# Patient Record
Sex: Male | Born: 1975 | Race: White | Hispanic: No | Marital: Married | Smoking: Current some day smoker
Health system: Southern US, Community
[De-identification: ages and names within clinical notes are randomized; demographics above are authoritative.]

## PROBLEM LIST (undated history)

## (undated) DIAGNOSIS — F1910 Other psychoactive substance abuse, uncomplicated: Secondary | ICD-10-CM

## (undated) DIAGNOSIS — R569 Unspecified convulsions: Secondary | ICD-10-CM

## (undated) DIAGNOSIS — K219 Gastro-esophageal reflux disease without esophagitis: Secondary | ICD-10-CM

## (undated) DIAGNOSIS — I219 Acute myocardial infarction, unspecified: Secondary | ICD-10-CM

## (undated) DIAGNOSIS — R011 Cardiac murmur, unspecified: Secondary | ICD-10-CM

## (undated) DIAGNOSIS — G709 Myoneural disorder, unspecified: Secondary | ICD-10-CM

## (undated) DIAGNOSIS — I639 Cerebral infarction, unspecified: Secondary | ICD-10-CM

## (undated) DIAGNOSIS — I509 Heart failure, unspecified: Secondary | ICD-10-CM

## (undated) DIAGNOSIS — D649 Anemia, unspecified: Secondary | ICD-10-CM

## (undated) DIAGNOSIS — F32A Depression, unspecified: Secondary | ICD-10-CM

## (undated) DIAGNOSIS — Z5189 Encounter for other specified aftercare: Secondary | ICD-10-CM

## (undated) DIAGNOSIS — IMO0002 Reserved for concepts with insufficient information to code with codable children: Secondary | ICD-10-CM

## (undated) DIAGNOSIS — F419 Anxiety disorder, unspecified: Secondary | ICD-10-CM

## (undated) DIAGNOSIS — I10 Essential (primary) hypertension: Secondary | ICD-10-CM

## (undated) DIAGNOSIS — M1990 Unspecified osteoarthritis, unspecified site: Secondary | ICD-10-CM

## (undated) HISTORY — DX: Acute myocardial infarction, unspecified: I21.9

## (undated) HISTORY — DX: Heart failure, unspecified: I50.9

## (undated) HISTORY — DX: Reserved for concepts with insufficient information to code with codable children: IMO0002

## (undated) HISTORY — DX: Unspecified osteoarthritis, unspecified site: M19.90

## (undated) HISTORY — DX: Other psychoactive substance abuse, uncomplicated: F19.10

## (undated) HISTORY — DX: Cardiac murmur, unspecified: R01.1

## (undated) HISTORY — DX: Unspecified convulsions: R56.9

## (undated) HISTORY — DX: Gastro-esophageal reflux disease without esophagitis: K21.9

## (undated) HISTORY — DX: Myoneural disorder, unspecified: G70.9

## (undated) HISTORY — DX: Encounter for other specified aftercare: Z51.89

## (undated) HISTORY — DX: Cerebral infarction, unspecified: I63.9

## (undated) HISTORY — PX: CARDIAC VALVE REPLACEMENT: SHX585

## (undated) HISTORY — DX: Anemia, unspecified: D64.9

---

## 2001-10-05 ENCOUNTER — Emergency Department (HOSPITAL_COMMUNITY): Admission: EM | Admit: 2001-10-05 | Discharge: 2001-10-05 | Payer: Self-pay

## 2020-11-04 ENCOUNTER — Emergency Department (HOSPITAL_COMMUNITY): Payer: Medicaid Other

## 2020-11-04 ENCOUNTER — Inpatient Hospital Stay (HOSPITAL_COMMUNITY)
Admission: EM | Disposition: A | Discharge status: Rehabilitation Facility | Payer: Self-pay | Source: Home / Self Care | Attending: Critical Care Medicine

## 2020-11-04 ENCOUNTER — Other Ambulatory Visit: Payer: Self-pay

## 2020-11-04 ENCOUNTER — Encounter (HOSPITAL_COMMUNITY): Payer: Self-pay

## 2020-11-04 ENCOUNTER — Inpatient Hospital Stay (HOSPITAL_COMMUNITY)
Admission: EM | Admit: 2020-11-04 | Discharge: 2020-11-13 | DRG: 219 | Disposition: A | Discharge status: Rehabilitation Facility | Payer: Medicaid Other | Attending: Critical Care Medicine | Admitting: Critical Care Medicine

## 2020-11-04 ENCOUNTER — Emergency Department (HOSPITAL_COMMUNITY): Payer: Medicaid Other | Admitting: Certified Registered Nurse Anesthetist

## 2020-11-04 DIAGNOSIS — D6959 Other secondary thrombocytopenia: Secondary | ICD-10-CM | POA: Diagnosis present

## 2020-11-04 DIAGNOSIS — I6521 Occlusion and stenosis of right carotid artery: Secondary | ICD-10-CM | POA: Diagnosis not present

## 2020-11-04 DIAGNOSIS — G8194 Hemiplegia, unspecified affecting left nondominant side: Secondary | ICD-10-CM | POA: Diagnosis present

## 2020-11-04 DIAGNOSIS — F1910 Other psychoactive substance abuse, uncomplicated: Secondary | ICD-10-CM

## 2020-11-04 DIAGNOSIS — R4701 Aphasia: Secondary | ICD-10-CM | POA: Diagnosis present

## 2020-11-04 DIAGNOSIS — Z9289 Personal history of other medical treatment: Secondary | ICD-10-CM

## 2020-11-04 DIAGNOSIS — F17210 Nicotine dependence, cigarettes, uncomplicated: Secondary | ICD-10-CM | POA: Diagnosis present

## 2020-11-04 DIAGNOSIS — D62 Acute posthemorrhagic anemia: Secondary | ICD-10-CM | POA: Diagnosis not present

## 2020-11-04 DIAGNOSIS — I119 Hypertensive heart disease without heart failure: Secondary | ICD-10-CM | POA: Diagnosis present

## 2020-11-04 DIAGNOSIS — I71019 Dissection of thoracic aorta, unspecified: Secondary | ICD-10-CM

## 2020-11-04 DIAGNOSIS — I7101 Dissection of thoracic aorta: Principal | ICD-10-CM | POA: Diagnosis present

## 2020-11-04 DIAGNOSIS — I959 Hypotension, unspecified: Secondary | ICD-10-CM | POA: Diagnosis not present

## 2020-11-04 DIAGNOSIS — R778 Other specified abnormalities of plasma proteins: Secondary | ICD-10-CM | POA: Diagnosis not present

## 2020-11-04 DIAGNOSIS — J9601 Acute respiratory failure with hypoxia: Secondary | ICD-10-CM | POA: Diagnosis not present

## 2020-11-04 DIAGNOSIS — I6330 Cerebral infarction due to thrombosis of unspecified cerebral artery: Secondary | ICD-10-CM

## 2020-11-04 DIAGNOSIS — Z20822 Contact with and (suspected) exposure to covid-19: Secondary | ICD-10-CM | POA: Diagnosis present

## 2020-11-04 DIAGNOSIS — F418 Other specified anxiety disorders: Secondary | ICD-10-CM | POA: Diagnosis not present

## 2020-11-04 DIAGNOSIS — H5461 Unqualified visual loss, right eye, normal vision left eye: Secondary | ICD-10-CM | POA: Diagnosis present

## 2020-11-04 DIAGNOSIS — I6340 Cerebral infarction due to embolism of unspecified cerebral artery: Secondary | ICD-10-CM | POA: Insufficient documentation

## 2020-11-04 DIAGNOSIS — F05 Delirium due to known physiological condition: Secondary | ICD-10-CM | POA: Diagnosis not present

## 2020-11-04 DIAGNOSIS — I639 Cerebral infarction, unspecified: Secondary | ICD-10-CM | POA: Diagnosis not present

## 2020-11-04 DIAGNOSIS — I10 Essential (primary) hypertension: Secondary | ICD-10-CM | POA: Diagnosis present

## 2020-11-04 DIAGNOSIS — R471 Dysarthria and anarthria: Secondary | ICD-10-CM | POA: Diagnosis not present

## 2020-11-04 DIAGNOSIS — Z91199 Patient's noncompliance with other medical treatment and regimen due to unspecified reason: Secondary | ICD-10-CM

## 2020-11-04 DIAGNOSIS — R1310 Dysphagia, unspecified: Secondary | ICD-10-CM | POA: Diagnosis not present

## 2020-11-04 DIAGNOSIS — E876 Hypokalemia: Secondary | ICD-10-CM | POA: Diagnosis not present

## 2020-11-04 DIAGNOSIS — Z978 Presence of other specified devices: Secondary | ICD-10-CM

## 2020-11-04 DIAGNOSIS — G9341 Metabolic encephalopathy: Secondary | ICD-10-CM | POA: Diagnosis not present

## 2020-11-04 DIAGNOSIS — Z681 Body mass index (BMI) 19 or less, adult: Secondary | ICD-10-CM

## 2020-11-04 DIAGNOSIS — I63411 Cerebral infarction due to embolism of right middle cerebral artery: Secondary | ICD-10-CM | POA: Diagnosis not present

## 2020-11-04 DIAGNOSIS — J9811 Atelectasis: Secondary | ICD-10-CM

## 2020-11-04 DIAGNOSIS — Z9889 Other specified postprocedural states: Secondary | ICD-10-CM

## 2020-11-04 DIAGNOSIS — G936 Cerebral edema: Secondary | ICD-10-CM | POA: Diagnosis not present

## 2020-11-04 DIAGNOSIS — R7989 Other specified abnormal findings of blood chemistry: Secondary | ICD-10-CM | POA: Diagnosis not present

## 2020-11-04 DIAGNOSIS — R9431 Abnormal electrocardiogram [ECG] [EKG]: Secondary | ICD-10-CM | POA: Diagnosis not present

## 2020-11-04 DIAGNOSIS — E43 Unspecified severe protein-calorie malnutrition: Secondary | ICD-10-CM | POA: Diagnosis present

## 2020-11-04 DIAGNOSIS — I7100 Dissection of unspecified site of aorta: Secondary | ICD-10-CM

## 2020-11-04 DIAGNOSIS — I808 Phlebitis and thrombophlebitis of other sites: Secondary | ICD-10-CM | POA: Diagnosis not present

## 2020-11-04 DIAGNOSIS — I7771 Dissection of carotid artery: Secondary | ICD-10-CM | POA: Diagnosis present

## 2020-11-04 DIAGNOSIS — L0390 Cellulitis, unspecified: Secondary | ICD-10-CM

## 2020-11-04 DIAGNOSIS — Z560 Unemployment, unspecified: Secondary | ICD-10-CM

## 2020-11-04 DIAGNOSIS — E871 Hypo-osmolality and hyponatremia: Secondary | ICD-10-CM | POA: Diagnosis not present

## 2020-11-04 DIAGNOSIS — E162 Hypoglycemia, unspecified: Secondary | ICD-10-CM | POA: Diagnosis not present

## 2020-11-04 DIAGNOSIS — Z01818 Encounter for other preprocedural examination: Secondary | ICD-10-CM

## 2020-11-04 DIAGNOSIS — Z781 Physical restraint status: Secondary | ICD-10-CM

## 2020-11-04 DIAGNOSIS — I083 Combined rheumatic disorders of mitral, aortic and tricuspid valves: Secondary | ICD-10-CM | POA: Diagnosis not present

## 2020-11-04 DIAGNOSIS — I351 Nonrheumatic aortic (valve) insufficiency: Secondary | ICD-10-CM | POA: Diagnosis present

## 2020-11-04 DIAGNOSIS — I809 Phlebitis and thrombophlebitis of unspecified site: Secondary | ICD-10-CM

## 2020-11-04 DIAGNOSIS — Z7289 Other problems related to lifestyle: Secondary | ICD-10-CM

## 2020-11-04 DIAGNOSIS — Z4659 Encounter for fitting and adjustment of other gastrointestinal appliance and device: Secondary | ICD-10-CM

## 2020-11-04 DIAGNOSIS — R29718 NIHSS score 18: Secondary | ICD-10-CM | POA: Diagnosis not present

## 2020-11-04 DIAGNOSIS — Z9119 Patient's noncompliance with other medical treatment and regimen: Secondary | ICD-10-CM

## 2020-11-04 DIAGNOSIS — R4587 Impulsiveness: Secondary | ICD-10-CM | POA: Diagnosis not present

## 2020-11-04 DIAGNOSIS — I447 Left bundle-branch block, unspecified: Secondary | ICD-10-CM | POA: Diagnosis not present

## 2020-11-04 DIAGNOSIS — R29810 Facial weakness: Secondary | ICD-10-CM | POA: Diagnosis not present

## 2020-11-04 DIAGNOSIS — R29707 NIHSS score 7: Secondary | ICD-10-CM | POA: Diagnosis present

## 2020-11-04 DIAGNOSIS — G9340 Encephalopathy, unspecified: Secondary | ICD-10-CM

## 2020-11-04 DIAGNOSIS — L03114 Cellulitis of left upper limb: Secondary | ICD-10-CM | POA: Diagnosis not present

## 2020-11-04 DIAGNOSIS — R233 Spontaneous ecchymoses: Secondary | ICD-10-CM | POA: Diagnosis not present

## 2020-11-04 HISTORY — DX: Depression, unspecified: F32.A

## 2020-11-04 HISTORY — PX: TEE WITHOUT CARDIOVERSION: SHX5443

## 2020-11-04 HISTORY — DX: Anxiety disorder, unspecified: F41.9

## 2020-11-04 HISTORY — DX: Essential (primary) hypertension: I10

## 2020-11-04 HISTORY — PX: ASCENDING AORTIC ROOT REPLACEMENT: SHX5729

## 2020-11-04 LAB — I-STAT CHEM 8, ED
BUN: 19 mg/dL (ref 6–20)
Calcium, Ion: 1.12 mmol/L — ABNORMAL LOW (ref 1.15–1.40)
Chloride: 101 mmol/L (ref 98–111)
Creatinine, Ser: 1.4 mg/dL — ABNORMAL HIGH (ref 0.61–1.24)
Glucose, Bld: 105 mg/dL — ABNORMAL HIGH (ref 70–99)
HCT: 43 % (ref 39.0–52.0)
Hemoglobin: 14.6 g/dL (ref 13.0–17.0)
Potassium: 4.7 mmol/L (ref 3.5–5.1)
Sodium: 134 mmol/L — ABNORMAL LOW (ref 135–145)
TCO2: 24 mmol/L (ref 22–32)

## 2020-11-04 LAB — RESP PANEL BY RT-PCR (FLU A&B, COVID) ARPGX2
Influenza A by PCR: NEGATIVE
Influenza B by PCR: NEGATIVE
SARS Coronavirus 2 by RT PCR: NEGATIVE

## 2020-11-04 LAB — POCT I-STAT, CHEM 8
BUN: 15 mg/dL (ref 6–20)
BUN: 15 mg/dL (ref 6–20)
BUN: 16 mg/dL (ref 6–20)
Calcium, Ion: 1.05 mmol/L — ABNORMAL LOW (ref 1.15–1.40)
Calcium, Ion: 1.18 mmol/L (ref 1.15–1.40)
Calcium, Ion: 1 mmol/L — ABNORMAL LOW (ref 1.15–1.40)
Chloride: 101 mmol/L (ref 98–111)
Chloride: 102 mmol/L (ref 98–111)
Chloride: 102 mmol/L (ref 98–111)
Creatinine, Ser: 0.9 mg/dL (ref 0.61–1.24)
Creatinine, Ser: 1 mg/dL (ref 0.61–1.24)
Creatinine, Ser: 1 mg/dL (ref 0.61–1.24)
Glucose, Bld: 105 mg/dL — ABNORMAL HIGH (ref 70–99)
Glucose, Bld: 122 mg/dL — ABNORMAL HIGH (ref 70–99)
Glucose, Bld: 126 mg/dL — ABNORMAL HIGH (ref 70–99)
HCT: 35 % — ABNORMAL LOW (ref 39.0–52.0)
HCT: 35 % — ABNORMAL LOW (ref 39.0–52.0)
HCT: 32 % — ABNORMAL LOW (ref 39.0–52.0)
Hemoglobin: 11.9 g/dL — ABNORMAL LOW (ref 13.0–17.0)
Hemoglobin: 11.9 g/dL — ABNORMAL LOW (ref 13.0–17.0)
Hemoglobin: 10.9 g/dL — ABNORMAL LOW (ref 13.0–17.0)
Potassium: 3.6 mmol/L (ref 3.5–5.1)
Potassium: 4.3 mmol/L (ref 3.5–5.1)
Potassium: 5.2 mmol/L — ABNORMAL HIGH (ref 3.5–5.1)
Sodium: 135 mmol/L (ref 135–145)
Sodium: 133 mmol/L — ABNORMAL LOW (ref 135–145)
Sodium: 130 mmol/L — ABNORMAL LOW (ref 135–145)
TCO2: 23 mmol/L (ref 22–32)
TCO2: 25 mmol/L (ref 22–32)
TCO2: 25 mmol/L (ref 22–32)

## 2020-11-04 LAB — PROTIME-INR
INR: 1 (ref 0.8–1.2)
Prothrombin Time: 12.9 seconds (ref 11.4–15.2)

## 2020-11-04 LAB — POCT I-STAT 7, (LYTES, BLD GAS, ICA,H+H)
Acid-Base Excess: 1 mmol/L (ref 0.0–2.0)
Acid-Base Excess: 1 mmol/L (ref 0.0–2.0)
Acid-base deficit: 1 mmol/L (ref 0.0–2.0)
Bicarbonate: 25.1 mmol/L (ref 20.0–28.0)
Bicarbonate: 27.4 mmol/L (ref 20.0–28.0)
Bicarbonate: 24.2 mmol/L (ref 20.0–28.0)
Calcium, Ion: 1.12 mmol/L — ABNORMAL LOW (ref 1.15–1.40)
Calcium, Ion: 1.02 mmol/L — ABNORMAL LOW (ref 1.15–1.40)
Calcium, Ion: 0.99 mmol/L — ABNORMAL LOW (ref 1.15–1.40)
HCT: 36 % — ABNORMAL LOW (ref 39.0–52.0)
HCT: 28 % — ABNORMAL LOW (ref 39.0–52.0)
HCT: 31 % — ABNORMAL LOW (ref 39.0–52.0)
Hemoglobin: 12.2 g/dL — ABNORMAL LOW (ref 13.0–17.0)
Hemoglobin: 9.5 g/dL — ABNORMAL LOW (ref 13.0–17.0)
Hemoglobin: 10.5 g/dL — ABNORMAL LOW (ref 13.0–17.0)
O2 Saturation: 100 %
O2 Saturation: 100 %
O2 Saturation: 100 %
Potassium: 3.6 mmol/L (ref 3.5–5.1)
Potassium: 4.8 mmol/L (ref 3.5–5.1)
Potassium: 5.7 mmol/L — ABNORMAL HIGH (ref 3.5–5.1)
Sodium: 134 mmol/L — ABNORMAL LOW (ref 135–145)
Sodium: 133 mmol/L — ABNORMAL LOW (ref 135–145)
Sodium: 129 mmol/L — ABNORMAL LOW (ref 135–145)
TCO2: 26 mmol/L (ref 22–32)
TCO2: 29 mmol/L (ref 22–32)
TCO2: 25 mmol/L (ref 22–32)
pCO2 arterial: 46.2 mmHg (ref 32.0–48.0)
pCO2 arterial: 52.6 mmHg — ABNORMAL HIGH (ref 32.0–48.0)
pCO2 arterial: 31.4 mmHg — ABNORMAL LOW (ref 32.0–48.0)
pH, Arterial: 7.343 — ABNORMAL LOW (ref 7.350–7.450)
pH, Arterial: 7.325 — ABNORMAL LOW (ref 7.350–7.450)
pH, Arterial: 7.495 — ABNORMAL HIGH (ref 7.350–7.450)
pO2, Arterial: 331 mmHg — ABNORMAL HIGH (ref 83.0–108.0)
pO2, Arterial: 330 mmHg — ABNORMAL HIGH (ref 83.0–108.0)
pO2, Arterial: 280 mmHg — ABNORMAL HIGH (ref 83.0–108.0)

## 2020-11-04 LAB — POCT I-STAT EG7
Acid-Base Excess: 0 mmol/L (ref 0.0–2.0)
Bicarbonate: 26.5 mmol/L (ref 20.0–28.0)
Calcium, Ion: 1.05 mmol/L — ABNORMAL LOW (ref 1.15–1.40)
HCT: 28 % — ABNORMAL LOW (ref 39.0–52.0)
Hemoglobin: 9.5 g/dL — ABNORMAL LOW (ref 13.0–17.0)
O2 Saturation: 76 %
Potassium: 4.4 mmol/L (ref 3.5–5.1)
Sodium: 133 mmol/L — ABNORMAL LOW (ref 135–145)
TCO2: 28 mmol/L (ref 22–32)
pCO2, Ven: 53.6 mmHg (ref 44.0–60.0)
pH, Ven: 7.303 (ref 7.250–7.430)
pO2, Ven: 46 mmHg — ABNORMAL HIGH (ref 32.0–45.0)

## 2020-11-04 LAB — COMPREHENSIVE METABOLIC PANEL
ALT: 15 U/L (ref 0–44)
AST: 20 U/L (ref 15–41)
Albumin: 3.7 g/dL (ref 3.5–5.0)
Alkaline Phosphatase: 55 U/L (ref 38–126)
Anion gap: 8 (ref 5–15)
BUN: 19 mg/dL (ref 6–20)
CO2: 25 mmol/L (ref 22–32)
Calcium: 9.1 mg/dL (ref 8.9–10.3)
Chloride: 100 mmol/L (ref 98–111)
Creatinine, Ser: 1.32 mg/dL — ABNORMAL HIGH (ref 0.61–1.24)
GFR, Estimated: >60 mL/min (ref >60)
Glucose, Bld: 112 mg/dL — ABNORMAL HIGH (ref 70–99)
Potassium: 4.5 mmol/L (ref 3.5–5.1)
Sodium: 133 mmol/L — ABNORMAL LOW (ref 135–145)
Total Bilirubin: 0.7 mg/dL (ref 0.3–1.2)
Total Protein: 6.3 g/dL — ABNORMAL LOW (ref 6.5–8.1)

## 2020-11-04 LAB — CBC
HCT: 42.4 % (ref 39.0–52.0)
Hemoglobin: 14.5 g/dL (ref 13.0–17.0)
MCH: 33.3 pg (ref 26.0–34.0)
MCHC: 34.2 g/dL (ref 30.0–36.0)
MCV: 97.2 fL (ref 80.0–100.0)
Platelets: 170 10*3/uL (ref 150–400)
RBC: 4.36 MIL/uL (ref 4.22–5.81)
RDW: 13.5 % (ref 11.5–15.5)
WBC: 12.7 10*3/uL — ABNORMAL HIGH (ref 4.0–10.5)
nRBC: 0 % (ref 0.0–0.2)

## 2020-11-04 LAB — ABO/RH: ABO/RH(D): O POS

## 2020-11-04 LAB — APTT: aPTT: 27 seconds (ref 24–36)

## 2020-11-04 LAB — CBG MONITORING, ED: Glucose-Capillary: 105 mg/dL — ABNORMAL HIGH (ref 70–99)

## 2020-11-04 LAB — DIFFERENTIAL
Abs Immature Granulocytes: 0.07 10*3/uL (ref 0.00–0.07)
Basophils Absolute: 0.1 10*3/uL (ref 0.0–0.1)
Basophils Relative: 0 %
Eosinophils Absolute: 0 10*3/uL (ref 0.0–0.5)
Eosinophils Relative: 0 %
Immature Granulocytes: 1 %
Lymphocytes Relative: 10 %
Lymphs Abs: 1.3 10*3/uL (ref 0.7–4.0)
Monocytes Absolute: 0.5 10*3/uL (ref 0.1–1.0)
Monocytes Relative: 4 %
Neutro Abs: 10.8 10*3/uL — ABNORMAL HIGH (ref 1.7–7.7)
Neutrophils Relative %: 85 %

## 2020-11-04 LAB — ETHANOL: Alcohol, Ethyl (B): <10 mg/dL (ref <10)

## 2020-11-04 SURGERY — ASCENDING AORTIC ROOT REPLACEMENT
Anesthesia: General | Site: Chest

## 2020-11-04 MED ORDER — DEXMEDETOMIDINE HCL IN NACL 400 MCG/100ML IV SOLN
0.1000 ug/kg/h | INTRAVENOUS | Status: AC
  Administered 2020-11-04: .3 ug/kg/h via INTRAVENOUS
  Filled 2020-11-04: qty 100

## 2020-11-04 MED ORDER — MANNITOL 20 % IV SOLN
Freq: Once | INTRAVENOUS | Status: DC
  Filled 2020-11-04: qty 13

## 2020-11-04 MED ORDER — PROPOFOL 10 MG/ML IV BOLUS
INTRAVENOUS | Status: DC | PRN
  Administered 2020-11-04 (×2): 100 mg via INTRAVENOUS

## 2020-11-04 MED ORDER — CEFAZOLIN SODIUM-DEXTROSE 2-4 GM/100ML-% IV SOLN
2.0000 g | INTRAVENOUS | Status: DC
  Filled 2020-11-04: qty 100

## 2020-11-04 MED ORDER — SODIUM CHLORIDE 0.9% FLUSH: 3.0000 mL | Freq: Once | INTRAVENOUS | Status: DC

## 2020-11-04 MED ORDER — TRANEXAMIC ACID (OHS) PUMP PRIME SOLUTION
2.0000 mg/kg | INTRAVENOUS | Status: DC
  Filled 2020-11-04: qty 1.32

## 2020-11-04 MED ORDER — MILRINONE LACTATE IN DEXTROSE 20-5 MG/100ML-% IV SOLN
0.3000 ug/kg/min | INTRAVENOUS | Status: DC
  Filled 2020-11-04: qty 100

## 2020-11-04 MED ORDER — ROCURONIUM BROMIDE 10 MG/ML (PF) SYRINGE
PREFILLED_SYRINGE | INTRAVENOUS | Status: AC
  Filled 2020-11-04: qty 30

## 2020-11-04 MED ORDER — PHENYLEPHRINE HCL-NACL 20-0.9 MG/250ML-% IV SOLN
30.0000 ug/min | INTRAVENOUS | Status: DC
  Filled 2020-11-04: qty 250

## 2020-11-04 MED ORDER — TRANEXAMIC ACID 1000 MG/10ML IV SOLN
1.5000 mg/kg/h | INTRAVENOUS | Status: AC
  Administered 2020-11-04: 1.5 mg/kg/h via INTRAVENOUS
  Filled 2020-11-04: qty 25

## 2020-11-04 MED ORDER — MIDAZOLAM HCL (PF) 10 MG/2ML IJ SOLN
INTRAMUSCULAR | Status: AC
  Filled 2020-11-04: qty 2

## 2020-11-04 MED ORDER — POTASSIUM CHLORIDE 2 MEQ/ML IV SOLN
80.0000 meq | INTRAVENOUS | Status: DC
  Filled 2020-11-04: qty 40

## 2020-11-04 MED ORDER — GLUTARALDEHYDE 0.625% SOAKING SOLUTION
TOPICAL | Status: DC
  Filled 2020-11-04: qty 50

## 2020-11-04 MED ORDER — IOHEXOL 350 MG/ML SOLN
75.0000 mL | Freq: Once | INTRAVENOUS | Status: AC | PRN
  Administered 2020-11-04: 75 mL via INTRAVENOUS

## 2020-11-04 MED ORDER — CEFAZOLIN SODIUM-DEXTROSE 2-4 GM/100ML-% IV SOLN
2.0000 g | INTRAVENOUS | Status: AC
  Administered 2020-11-04 – 2020-11-05 (×2): 2 g via INTRAVENOUS
  Filled 2020-11-04: qty 100

## 2020-11-04 MED ORDER — LACTATED RINGERS IV SOLN: INTRAVENOUS | Status: DC | PRN

## 2020-11-04 MED ORDER — SODIUM CHLORIDE 0.9 % IV SOLN
INTRAVENOUS | Status: DC
  Filled 2020-11-04: qty 30

## 2020-11-04 MED ORDER — NOREPINEPHRINE 4 MG/250ML-% IV SOLN
0.0000 ug/min | INTRAVENOUS | Status: DC
Start: 2020-11-04 — End: 2020-11-05
  Filled 2020-11-04: qty 250

## 2020-11-04 MED ORDER — HEPARIN SODIUM (PORCINE) 1000 UNIT/ML IJ SOLN
INTRAMUSCULAR | Status: DC | PRN
  Administered 2020-11-04: 21000 [IU] via INTRAVENOUS

## 2020-11-04 MED ORDER — HYDROCORTISONE NA SUCCINATE PF 1000 MG IJ SOLR
INTRAMUSCULAR | Status: DC | PRN
  Administered 2020-11-04: 250 mg via INTRAVENOUS

## 2020-11-04 MED ORDER — PROTAMINE SULFATE 10 MG/ML IV SOLN
INTRAVENOUS | Status: AC
  Filled 2020-11-04: qty 50

## 2020-11-04 MED ORDER — LIDOCAINE HCL (CARDIAC) PF 100 MG/5ML IV SOSY
PREFILLED_SYRINGE | INTRAVENOUS | Status: DC | PRN
  Administered 2020-11-04: 40 mg via INTRAVENOUS

## 2020-11-04 MED ORDER — 0.9 % SODIUM CHLORIDE (POUR BTL) OPTIME
TOPICAL | Status: DC | PRN
  Administered 2020-11-04: 5000 mL

## 2020-11-04 MED ORDER — PLASMA-LYTE A IV SOLN
INTRAVENOUS | Status: DC | PRN
  Administered 2020-11-04: 1000 mL

## 2020-11-04 MED ORDER — FENTANYL CITRATE (PF) 100 MCG/2ML IJ SOLN
INTRAMUSCULAR | Status: DC | PRN
  Administered 2020-11-04: 100 ug via INTRAVENOUS
  Administered 2020-11-04: 50 ug via INTRAVENOUS
  Administered 2020-11-04: 100 ug via INTRAVENOUS
  Administered 2020-11-04: 150 ug via INTRAVENOUS
  Administered 2020-11-04 (×3): 100 ug via INTRAVENOUS
  Administered 2020-11-04: 250 ug via INTRAVENOUS
  Administered 2020-11-04: 50 ug via INTRAVENOUS
  Administered 2020-11-04: 150 ug via INTRAVENOUS
  Administered 2020-11-05: 100 ug via INTRAVENOUS
  Administered 2020-11-05: 50 ug via INTRAVENOUS
  Administered 2020-11-05: 150 ug via INTRAVENOUS
  Administered 2020-11-05: 50 ug via INTRAVENOUS

## 2020-11-04 MED ORDER — VANCOMYCIN HCL 1250 MG/250ML IV SOLN
1250.0000 mg | INTRAVENOUS | Status: AC
  Administered 2020-11-04: 1250 mg via INTRAVENOUS
  Filled 2020-11-04: qty 250

## 2020-11-04 MED ORDER — PLASMA-LYTE A IV SOLN
INTRAVENOUS | Status: DC
  Filled 2020-11-04: qty 5

## 2020-11-04 MED ORDER — EPINEPHRINE HCL 5 MG/250ML IV SOLN IN NS
0.0000 ug/min | INTRAVENOUS | Status: DC
  Filled 2020-11-04: qty 250

## 2020-11-04 MED ORDER — INSULIN REGULAR(HUMAN) IN NACL 100-0.9 UT/100ML-% IV SOLN
INTRAVENOUS | Status: AC
  Administered 2020-11-04: 1 [IU]/h via INTRAVENOUS
  Filled 2020-11-04: qty 100

## 2020-11-04 MED ORDER — HYDROCORTISONE NA SUCCINATE PF 250 MG IJ SOLR
INTRAMUSCULAR | Status: AC
  Filled 2020-11-04: qty 250

## 2020-11-04 MED ORDER — PROTAMINE SULFATE 10 MG/ML IV SOLN
INTRAVENOUS | Status: AC
  Filled 2020-11-04: qty 5

## 2020-11-04 MED ORDER — ROCURONIUM BROMIDE 10 MG/ML (PF) SYRINGE
PREFILLED_SYRINGE | INTRAVENOUS | Status: DC | PRN
  Administered 2020-11-04: 50 mg via INTRAVENOUS
  Administered 2020-11-04: 100 mg via INTRAVENOUS
  Administered 2020-11-04 – 2020-11-05 (×3): 50 mg via INTRAVENOUS

## 2020-11-04 MED ORDER — TRANEXAMIC ACID (OHS) BOLUS VIA INFUSION
15.0000 mg/kg | INTRAVENOUS | Status: AC
  Administered 2020-11-04: 990 mg via INTRAVENOUS
  Filled 2020-11-04: qty 990

## 2020-11-04 MED ORDER — NITROGLYCERIN IN D5W 200-5 MCG/ML-% IV SOLN
2.0000 ug/min | INTRAVENOUS | Status: AC
  Administered 2020-11-04: 15 ug/min via INTRAVENOUS
  Filled 2020-11-04: qty 250

## 2020-11-04 MED ORDER — MIDAZOLAM HCL 5 MG/5ML IJ SOLN
INTRAMUSCULAR | Status: DC | PRN
  Administered 2020-11-04: 4 mg via INTRAVENOUS
  Administered 2020-11-04 – 2020-11-05 (×3): 2 mg via INTRAVENOUS

## 2020-11-04 MED ORDER — IOHEXOL 350 MG/ML SOLN
100.0000 mL | Freq: Once | INTRAVENOUS | Status: AC | PRN
  Administered 2020-11-04: 100 mL via INTRAVENOUS

## 2020-11-04 MED ORDER — FENTANYL CITRATE (PF) 250 MCG/5ML IJ SOLN
INTRAMUSCULAR | Status: AC
  Filled 2020-11-04: qty 5

## 2020-11-04 MED ORDER — HEMOSTATIC AGENTS (NO CHARGE) OPTIME
TOPICAL | Status: DC | PRN
  Administered 2020-11-04 – 2020-11-05 (×3): 1 via TOPICAL

## 2020-11-04 MED ORDER — FENTANYL CITRATE (PF) 250 MCG/5ML IJ SOLN
INTRAMUSCULAR | Status: AC
  Filled 2020-11-04: qty 10

## 2020-11-04 MED ORDER — HEPARIN SODIUM (PORCINE) 1000 UNIT/ML IJ SOLN
INTRAMUSCULAR | Status: AC
  Filled 2020-11-04: qty 1

## 2020-11-04 MED ORDER — VANCOMYCIN HCL 1000 MG IV SOLR
INTRAVENOUS | Status: DC
  Filled 2020-11-04: qty 1000

## 2020-11-04 MED ORDER — PROPOFOL 10 MG/ML IV BOLUS
INTRAVENOUS | Status: AC
  Filled 2020-11-04: qty 20

## 2020-11-04 MED ORDER — SODIUM CHLORIDE (PF) 0.9 % IJ SOLN
OROMUCOSAL | Status: DC | PRN
  Administered 2020-11-04: 4 mL via TOPICAL

## 2020-11-04 SURGICAL SUPPLY — 114 items
ADAPTER CARDIO PERF ANTE/RETRO (ADAPTER) ×3 IMPLANT
ADAPTER DLP PERFUSION .25INX2I (MISCELLANEOUS) ×3 IMPLANT
ADH SKN CLS APL DERMABOND .7 (GAUZE/BANDAGES/DRESSINGS) ×2
ADPR CRDPLG .25X.64 STRL (MISCELLANEOUS) ×2
ADPR PRFSN 84XANTGRD RTRGD (ADAPTER) ×2
APL SRG 7X2 LUM MLBL SLNT (VASCULAR PRODUCTS) ×4
APPLICATOR TIP COSEAL (VASCULAR PRODUCTS) ×6 IMPLANT
BAG DECANTER FOR FLEXI CONT (MISCELLANEOUS) ×6 IMPLANT
BLADE CLIPPER SURG (BLADE) ×3 IMPLANT
BLADE STERNUM SYSTEM 6 (BLADE) ×3 IMPLANT
BLADE SURG 11 STRL SS (BLADE) ×6 IMPLANT
CANISTER SUCT 3000ML PPV (MISCELLANEOUS) ×3 IMPLANT
CANNULA FEM VENOUS REMOTE 22FR (CANNULA) ×3 IMPLANT
CANNULA GUNDRY RCSP 15FR (MISCELLANEOUS) ×3 IMPLANT
CANNULA OPTISITE PERFUSION 18F (CANNULA) ×3 IMPLANT
CANNULA SUMP PERICARDIAL (CANNULA) ×3 IMPLANT
CATH CPB KIT OWEN (MISCELLANEOUS) ×3 IMPLANT
CATH HEART VENT LEFT (CATHETERS) ×2 IMPLANT
CATH ROBINSON RED A/P 18FR (CATHETERS) ×3 IMPLANT
CATH THORACIC 36FR (CATHETERS) ×3 IMPLANT
CAUTERY SURG HI TEMP FINE TIP (MISCELLANEOUS) ×3 IMPLANT
CLIP VESOCCLUDE MED 24/CT (CLIP) ×3 IMPLANT
CNTNR URN SCR LID CUP LEK RST (MISCELLANEOUS) ×4 IMPLANT
CONN 1/2X3/8X3/8 Y GISH (MISCELLANEOUS) ×3 IMPLANT
CONNECTOR 1/2X3/8X1/2 3 WAY (MISCELLANEOUS) ×3
CONNECTOR 1/2X3/8X1/2 3WAY (MISCELLANEOUS) ×2 IMPLANT
CONT SPEC 4OZ STRL OR WHT (MISCELLANEOUS) ×6
CONTAINER PROTECT SURGISLUSH (MISCELLANEOUS) ×6 IMPLANT
COVER SURGICAL LIGHT HANDLE (MISCELLANEOUS) ×6 IMPLANT
DERMABOND ADVANCED (GAUZE/BANDAGES/DRESSINGS) ×1
DERMABOND ADVANCED .7 DNX12 (GAUZE/BANDAGES/DRESSINGS) ×2 IMPLANT
DEVICE CLOSURE PERCLS PRGLD 6F (VASCULAR PRODUCTS) ×8 IMPLANT
DRAIN CHANNEL 32F RND 10.7 FF (WOUND CARE) ×3 IMPLANT
DRAPE CV SPLIT W-CLR ANES SCRN (DRAPES) ×3 IMPLANT
DRAPE INCISE IOBAN 66X45 STRL (DRAPES) ×6 IMPLANT
DRAPE PERI GROIN 82X75IN TIB (DRAPES) ×3 IMPLANT
DRAPE WARM FLUID 44X44 (DRAPES) ×3 IMPLANT
DRSG AQUACEL AG ADV 3.5X14 (GAUZE/BANDAGES/DRESSINGS) ×3 IMPLANT
ELECT REM PT RETURN 9FT ADLT (ELECTROSURGICAL) ×6
ELECTRODE REM PT RTRN 9FT ADLT (ELECTROSURGICAL) ×4 IMPLANT
FELT TEFLON 1X6 (MISCELLANEOUS) ×6 IMPLANT
FIBERTAPE STERNAL CLSR 2 36IN (SUTURE) ×15 IMPLANT
FIBERTAPE STERNAL CLSR 2X36 (SUTURE) ×9 IMPLANT
GAUZE SPONGE 4X4 12PLY STRL (GAUZE/BANDAGES/DRESSINGS) ×6 IMPLANT
GAUZE SPONGE 4X4 12PLY STRL LF (GAUZE/BANDAGES/DRESSINGS) ×6 IMPLANT
GLOVE SURG ENC MOIS LTX SZ6 (GLOVE) IMPLANT
GLOVE SURG ENC MOIS LTX SZ6.5 (GLOVE) IMPLANT
GLOVE SURG ENC MOIS LTX SZ7 (GLOVE) IMPLANT
GLOVE SURG ENC MOIS LTX SZ7.5 (GLOVE) IMPLANT
GLOVE SURG ORTHO LTX SZ7.5 (GLOVE) ×9 IMPLANT
GLOVE SURG SYN 7.5  E (GLOVE) ×3
GLOVE SURG SYN 7.5 E (GLOVE) ×2 IMPLANT
GOWN STRL REUS W/ TWL LRG LVL3 (GOWN DISPOSABLE) ×8 IMPLANT
GOWN STRL REUS W/TWL LRG LVL3 (GOWN DISPOSABLE) ×12
GRAFT HEMASHIELD 14X8MM (Vascular Products) ×3 IMPLANT
GRAFT WOVEN D/V 26DX30L (Vascular Products) ×3 IMPLANT
HEMOSTAT POWDER SURGIFOAM 1G (HEMOSTASIS) ×18 IMPLANT
INSERT FOGARTY SM (MISCELLANEOUS) ×3 IMPLANT
INSERT FOGARTY XLG (MISCELLANEOUS) ×3 IMPLANT
KIT BASIN OR (CUSTOM PROCEDURE TRAY) ×3 IMPLANT
KIT DILATOR VASC 18G NDL (KITS) ×3 IMPLANT
KIT DRAINAGE VACCUM ASSIST (KITS) ×3 IMPLANT
KIT MICROPUNCTURE NIT STIFF (SHEATH) ×3 IMPLANT
KIT SUCTION CATH 14FR (SUCTIONS) ×9 IMPLANT
KIT TURNOVER KIT B (KITS) ×3 IMPLANT
LEAD PACING MYOCARDI (MISCELLANEOUS) ×3 IMPLANT
LINE VENT (MISCELLANEOUS) ×3 IMPLANT
LOOP VESSEL SUPERMAXI WHITE (MISCELLANEOUS) ×3 IMPLANT
NDL SUT PASSING CERCLAGE MED (SUTURE) ×6
NEEDLE SUT PASSING CERCLAG MED (SUTURE) ×4 IMPLANT
NS IRRIG 1000ML POUR BTL (IV SOLUTION) ×15 IMPLANT
PACK E OPEN HEART (SUTURE) ×3 IMPLANT
PACK OPEN HEART (CUSTOM PROCEDURE TRAY) ×3 IMPLANT
PAD ARMBOARD 7.5X6 YLW CONV (MISCELLANEOUS) ×6 IMPLANT
PERCLOSE PROGLIDE 6F (VASCULAR PRODUCTS) ×12
POSITIONER HEAD DONUT 9IN (MISCELLANEOUS) ×3 IMPLANT
POWDER SURGICEL 3.0 GRAM (HEMOSTASIS) ×3 IMPLANT
SEALANT SURG COSEAL 4ML (VASCULAR PRODUCTS) ×3 IMPLANT
SEALANT SURG COSEAL 8ML (VASCULAR PRODUCTS) ×3 IMPLANT
SET MPS 3-ND DEL (MISCELLANEOUS) ×3 IMPLANT
SET VEIN GRAFT PERF (SET/KITS/TRAYS/PACK) ×3 IMPLANT
SPONGE T-LAP 4X18 ~~LOC~~+RFID (SPONGE) ×3 IMPLANT
SUT BONE WAX W31G (SUTURE) ×3 IMPLANT
SUT EB EXC GRN/WHT 2-0 V-5 (SUTURE) ×6 IMPLANT
SUT ETHIBOND 2 0 SH (SUTURE)
SUT ETHIBOND 2 0 SH 36X2 (SUTURE) IMPLANT
SUT ETHIBOND 4 0 RB 1 (SUTURE) ×6 IMPLANT
SUT ETHIBOND NAB MH 2-0 36IN (SUTURE) ×6 IMPLANT
SUT ETHIBOND X763 2 0 SH 1 (SUTURE) ×6 IMPLANT
SUT MNCRL AB 3-0 PS2 18 (SUTURE) ×6 IMPLANT
SUT PDS AB 1 CTX 36 (SUTURE) ×6 IMPLANT
SUT PROLENE 3 0 SH DA (SUTURE) ×12 IMPLANT
SUT PROLENE 3 0 SH1 36 (SUTURE) ×30 IMPLANT
SUT PROLENE 4 0 RB 1 (SUTURE) ×30
SUT PROLENE 4 0 SH DA (SUTURE) ×9 IMPLANT
SUT PROLENE 4-0 RB1 .5 CRCL 36 (SUTURE) ×20 IMPLANT
SUT PROLENE 5 0 C 1 36 (SUTURE) ×18 IMPLANT
SUT PROLENE 6 0 C 1 30 (SUTURE) ×3 IMPLANT
SUT SILK  1 MH (SUTURE) ×3
SUT SILK 1 MH (SUTURE) ×2 IMPLANT
SUT SILK 2 0 SH CR/8 (SUTURE) IMPLANT
SUT SILK 3 0 SH CR/8 (SUTURE) IMPLANT
SUT STEEL 6MS V (SUTURE) IMPLANT
SUT VIC AB 2-0 CTX 27 (SUTURE) ×3 IMPLANT
SYSTEM SAHARA CHEST DRAIN ATS (WOUND CARE) ×3 IMPLANT
TAPE CLOTH SURG 4X10 WHT LF (GAUZE/BANDAGES/DRESSINGS) ×3 IMPLANT
TOWEL GREEN STERILE (TOWEL DISPOSABLE) ×3 IMPLANT
TOWEL GREEN STERILE FF (TOWEL DISPOSABLE) ×3 IMPLANT
TRAY FOLEY SLVR 14FR TEMP STAT (SET/KITS/TRAYS/PACK) ×3 IMPLANT
UNDERPAD 30X36 HEAVY ABSORB (UNDERPADS AND DIAPERS) ×3 IMPLANT
VENT LEFT HEART 12002 (CATHETERS) ×3
WATER STERILE IRR 1000ML POUR (IV SOLUTION) ×6 IMPLANT
WIRE HI TORQ VERSACORE-J 145CM (WIRE) ×3 IMPLANT
YANKAUER SUCT BULB TIP NO VENT (SUCTIONS) ×3 IMPLANT

## 2020-11-04 NOTE — ED Notes (Signed)
Patient transported to CT 

## 2020-11-04 NOTE — ED Notes (Signed)
Unable to get pt to sign MSE due to aphasia and no family present.

## 2020-11-04 NOTE — ED Provider Notes (Signed)
[facility] EMERGENCY DEPARTMENT Provider Note   CSN: [id number] Arrival date & time: 11/04/20  1827  An emergency department physician performed an initial assessment on this suspected stroke patient at 1830.  History Chief Complaint  Patient presents with   Code Stroke    Jesse [name] is a 45 y.o. male.  Patient is a 45 year old male with no known past medical history other than anxiety who presents as a code stroke.  History is obtained from his wife after his arrival to the ED.  She reports that he was doing some yard work when he suddenly clutched his chest and was complaining of some chest pain and a headache.  He then started noticing some loss of vision in his right eye and was having some aphasia.  EMS was on scene for a prolonged period of time because patient at that time was refusing to be evaluated.  Ultimately he was convinced to be transported and came in as a code stroke.  History is limited initially due to patient's aphasia.      Past Medical History:  Diagnosis Date   Anxiety    Depression     There are no problems to display for this patient.   The histories are not reviewed yet. Please review them in the "History" navigator section and refresh this SmartLink.     No family history on file.     Home Medications Prior to Admission medications   Not on File    Allergies    Patient has no allergy information on record.  Review of Systems   Review of Systems  Unable to perform ROS: Mental status change   Physical Exam Updated Vital Signs BP (!) 163/100   Pulse (!) 55   Resp 14   Wt 66 kg   SpO2 100%   Physical Exam Constitutional:      Appearance: He is well-developed.  HENT:     Head: Normocephalic and atraumatic.  Eyes:     Pupils: Pupils are equal, round, and reactive to light.  Cardiovascular:     Rate and Rhythm: Normal rate and regular rhythm.     Heart sounds: Normal heart sounds.  Pulmonary:     Effort:  Pulmonary effort is normal. No respiratory distress.     Breath sounds: Normal breath sounds. No wheezing or rales.  Chest:     Chest wall: No tenderness.  Abdominal:     General: Bowel sounds are normal.     Palpations: Abdomen is soft.     Tenderness: There is no abdominal tenderness. There is no guarding or rebound.  Musculoskeletal:        General: Normal range of motion.     Cervical back: Normal range of motion and neck supple.  Lymphadenopathy:     Cervical: No cervical adenopathy.  Skin:    General: Skin is warm and dry.     Findings: No rash.  Neurological:     Mental Status: He is alert.     Comments: Patient with expressive aphasia, he is moving all extremities symmetrically, no obvious facial droop    ED Results / Procedures / Treatments   Labs (all labs ordered are listed, but only abnormal results are displayed) Labs Reviewed  CBC - Abnormal; Notable for the following components:      Result Value   WBC 12.7 (*)    All other components within normal limits  DIFFERENTIAL - Abnormal; Notable for the following components:  Neutro Abs 10.8 (*)    All other components within normal limits  COMPREHENSIVE METABOLIC PANEL - Abnormal; Notable for the following components:   Sodium 133 (*)    Glucose, Bld 112 (*)    Creatinine, Ser 1.32 (*)    Total Protein 6.3 (*)    All other components within normal limits  CBG MONITORING, ED - Abnormal; Notable for the following components:   Glucose-Capillary 105 (*)    All other components within normal limits  I-STAT CHEM 8, ED - Abnormal; Notable for the following components:   Sodium 134 (*)    Creatinine, Ser 1.40 (*)    Glucose, Bld 105 (*)    Calcium, Ion 1.12 (*)    All other components within normal limits  RESP PANEL BY RT-PCR (FLU A&B, COVID) ARPGX2  PROTIME-INR  APTT  ETHANOL  URINALYSIS, ROUTINE W REFLEX MICROSCOPIC  RAPID URINE DRUG SCREEN, HOSP PERFORMED  CBG MONITORING, ED  TYPE AND SCREEN     EKG None  Radiology CT HEAD CODE STROKE WO CONTRAST  Result Date: 11/04/2020 CLINICAL DATA:  Code stroke.  Aphasia EXAM: CT HEAD WITHOUT CONTRAST TECHNIQUE: Contiguous axial images were obtained from the base of the skull through the vertex without intravenous contrast. COMPARISON:  None. FINDINGS: Brain: There is no acute intracranial hemorrhage or mass effect. Question small subtle loss of gray-white differentiation in the right insular region. Ventricles and sulci are normal in size and configuration. Vascular: Question of hyperdensity along distal right M2 or proximal M3 MCA branch in the posterior sylvian fissure. Skull: Unremarkable. Sinuses/Orbits: No acute abnormality. Other: Mastoid air cells are clear. ASPECTS [facility] Stroke Program Early CT Score) - Ganglionic level infarction (caudate, lentiform nuclei, internal capsule, insula, M1-M3 cortex): 6 - Supraganglionic infarction (M4-M6 cortex): 3 Total score (0-10 with 10 being normal): 9 IMPRESSION: No acute intracranial hemorrhage. Question subtle small loss of gray-white differentiation in the right insular region (ASPECT score is 9). Possible hyperdense distal right M2 or proximal M3 MCA branch in the posterior sylvian fissure. These results were communicated to Dr. Iver Nestle at 6:45 pm on 11/04/2020 by text page via the [facility] messaging system. Electronically Signed   By: Guadlupe Spanish M.D.   On: 11/04/2020 18:59   CT ANGIO HEAD NECK W WO CM (CODE STROKE)  Result Date: 11/04/2020 CLINICAL DATA:  Code stroke EXAM: CT ANGIOGRAPHY HEAD AND NECK TECHNIQUE: Multidetector CT imaging of the head and neck was performed using the standard protocol during bolus administration of intravenous contrast. Multiplanar CT image reconstructions and MIPs were obtained to evaluate the vascular anatomy. Carotid stenosis measurements (when applicable) are obtained utilizing NASCET criteria, using the distal internal carotid diameter as the denominator. CONTRAST:   28mL OMNIPAQUE IOHEXOL 350 MG/ML SOLN COMPARISON:  None. FINDINGS: CTA NECK Aortic arch: A dissection flap is present within the included ascending aorta extending into the transverse portion of the arch but not the descending portion. Dissection involves the innominate artery with thrombosis of the false lumen. This continues into the right common carotid. Left common carotid and left subclavian artery origins are patent. Right carotid system: As noted above, there is extension of dissection into the common carotid with occlusion of the false lumen resulting in narrowing of the true lumen. Minimal diameter of 1.5 mm. The external carotid origin is patent. There is minimal contrast enhancement at the ICA origin and subsequent minimal enhancement along portions of the cervical ICA. Left carotid system: Patent. Trace calcified plaque along the proximal ICA.  No stenosis. Vertebral arteries: Patent.  Right vertebral artery is dominant. Skeleton: Degenerative changes of the cervical spine. Other neck: Unremarkable. Upper chest: Included upper lungs are clear. Review of the MIP images confirms the above findings CTA HEAD Anterior circulation: Possible faint enhancement of the intracranial right internal carotid artery. There is reconstitution at the terminus. Right M1 and proximal M2 MCA are patent. There is occlusion of a small right distal M2 and proximal M3 MCA branch corresponding to abnormality on noncontrast CT. Left middle and both anterior cerebral arteries are patent. Anterior communicating artery is present. Posterior circulation: Intracranial vertebral arteries are patent. The left vertebral artery becomes small in caliber after PICA origin. Basilar artery is patent. Major cerebellar artery origins are patent. Posterior cerebral arteries are patent there is a left posterior communicating artery. Venous sinuses: Patent as allowed by contrast bolus timing. 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 Dissection involves innominate artery with thrombosis of false lumen. This extends into the right common carotid with narrowing of the true lumen to a minimal diameter of 1.5 mm. Minimal enhancement within the right cervical and intracranial ICA. Reconstitution at the right ICA terminus. No proximal intracranial vessel occlusion. 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 These results were called by telephone at the time of interpretation on 11/04/2020 at 6:59 pm to provider [facility]IS[facility] , who verbally acknowledged these results. Electronically Signed   By: Guadlupe SpanishPraneil  Patel M.D.   On: 11/04/2020 19:10    Procedures Procedures   Medications Ordered in ED Medications  sodium chloride flush (NS) 0.9 % injection 3 mL (has no administration in time range)  dexmedetomidine (PRECEDEX) 400 MCG/100ML (4 mcg/mL) infusion (has no administration in time range)  insulin regular, human (MYXREDLIN) 100 units/ 100 mL infusion (has no administration in time range)  EPINEPHrine (ADRENALIN) 5 mg in NS 250 mL (0.02 mg/mL) premix infusion (has no administration in time range)  milrinone (PRIMACOR) 20 MG/100 ML (0.2 mg/mL) infusion (has no administration in time range)  nitroGLYCERIN 50 mg in dextrose 5 % 250 mL (0.2 mg/mL) infusion (has no administration in time range)  norepinephrine (LEVOPHED) 4mg  in 250mL premix infusion (has no administration in time range)  phenylephrine (NEOSYNEPHRINE) 20-0.9 MG/250ML-% infusion (has no administration in time range)  potassium chloride injection 80 mEq (has no administration in time range)  heparin 30,000 units/NS 1000 mL solution for CELLSAVER (has no administration in time range)  heparin sodium (porcine) 5,000 Units, papaverine 60 mg in electrolyte-A (PLASMALYTE-A PH 7.4) 1,000  mL irrigation (has no administration in time range)  tranexamic acid (CYKLOKAPRON) pump prime solution 132 mg (has no administration in time range)  tranexamic acid (CYKLOKAPRON) bolus via infusion - over 30 minutes 990 mg (has no administration in time range)  tranexamic acid (CYKLOKAPRON) 2,500 mg in sodium chloride 0.9 % 250 mL (10 mg/mL) infusion (has no administration in time range)  vancomycin (VANCOREADY) IVPB 1250 mg/250 mL (has no administration in time range)  ceFAZolin (ANCEF) IVPB 2g/100 mL premix (has no administration in time range)  ceFAZolin (ANCEF) IVPB 2g/100 mL premix (has no administration in time range)  [facility] Blood Cardioplegia vial (lidocaine/magnesium/mannitol 0.26g-4g-6.4g) (has no administration in time range)  vancomycin (VANCOCIN) 1,000 mg in sodium chloride 0.9 % 1,000 mL irrigation (has no administration in time range)  iohexol (  OMNIPAQUE) 350 MG/ML injection 75 mL (75 mLs Intravenous Contrast Given 11/04/20 1847)  iohexol (OMNIPAQUE) 350 MG/ML injection 100 mL (100 mLs Intravenous Contrast Given 11/04/20 1932)    ED Course  I have reviewed the triage vital signs and the nursing notes.  Pertinent labs & imaging results that were available during my care of the patient were reviewed by me and considered in my medical decision making (see chart for details).    MDM Rules/Calculators/A&P                          Patient is a 45 year old male who presents as a code stroke.  On his CTA it was noted that he had aortic dissection with extension into the internal carotid, causing a thrombosis and stroke symptoms.  Neurology is at bedside evaluating.  I spoke with Dr. Cornelius Moras with CT surgery.  Patient was emergently taken back to the CT scanner and had a CTA of the chest abdomen pelvis showing a type a dissection.  His blood pressure has been running around 140 systolic.  He was not started on esmolol due to his concurrent stroke.  Dr. Cornelius Moras is taking the patient emergently  to the operating room.  Labs reviewed.  CRITICAL CARE Performed by: Rolan Bucco Total critical care time: 70 minutes Critical care time was exclusive of separately billable procedures and treating other patients. Critical care was necessary to treat or prevent imminent or life-threatening deterioration. Critical care was time spent personally by me on the following activities: development of treatment plan with patient and/or surrogate as well as nursing, discussions with consultants, evaluation of patient's response to treatment, examination of patient, obtaining history from patient or surrogate, ordering and performing treatments and interventions, ordering and review of laboratory studies, ordering and review of radiographic studies, pulse oximetry and re-evaluation of patient's condition.  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

## 2020-11-04 NOTE — ED Triage Notes (Addendum)
Pt arrived via [facility] from home. Per EMS wife told them pt was working outside then came into house and she found him laying in the attic, confused. Per EMS pt has aphasia. Per EMS pt c/o HA, blurred vision. Per EMS, pt states pt LKW 1300 today. Pt has receptive and expressive aphasia.

## 2020-11-04 NOTE — Anesthesia Procedure Notes (Signed)
Central Venous Catheter Insertion Performed by: Nolon Nations, MD, anesthesiologist Start/End7/03/2021 8:28 PM, 11/04/2020 8:48 PM Patient location: Pre-op. Preanesthetic checklist: patient identified, IV checked, site marked, risks and benefits discussed, surgical consent, monitors and equipment checked, pre-op evaluation, timeout performed and anesthesia consent Position: Trendelenburg Lidocaine 1% used for infiltration and patient sedated Hand hygiene performed  and maximum sterile barriers used  Catheter size: 9 Fr MAC introducer Procedure performed using ultrasound guided technique. Ultrasound Notes:anatomy identified, needle tip was noted to be adjacent to the nerve/plexus identified, no ultrasound evidence of intravascular and/or intraneural injection and image(s) printed for medical record Attempts: 1 Following insertion, line sutured, dressing applied and Biopatch. Post procedure assessment: blood return through all ports, free fluid flow and no air  Patient tolerated the procedure well with no immediate complications.

## 2020-11-04 NOTE — Anesthesia Procedure Notes (Signed)
Procedure Name: Intubation Date/Time: 11/04/2020 8:23 PM Performed by: Claudina Lick, CRNA Pre-anesthesia Checklist: Patient identified, Emergency Drugs available, Suction available and Patient being monitored Patient Re-evaluated:Patient Re-evaluated prior to induction Oxygen Delivery Method: Circle System Utilized Preoxygenation: Pre-oxygenation with 100% oxygen Induction Type: IV induction Ventilation: Mask ventilation without difficulty Laryngoscope Size: Miller and 2 Grade View: Grade I Tube type: Oral Tube size: 8.0 mm Number of attempts: 1 Airway Equipment and Method: Stylet and Oral airway Placement Confirmation: ETT inserted through vocal cords under direct vision, positive ETCO2 and breath sounds checked- equal and bilateral Secured at: 23 cm Tube secured with: Tape Dental Injury: Teeth and Oropharynx as per pre-operative assessment

## 2020-11-04 NOTE — ED Notes (Signed)
Paged Cardiothoracic Surgery for [facility]

## 2020-11-04 NOTE — H&P (Signed)
[address]       [place] [address]             [phone]          CARDIOTHORACIC SURGERY CONSULTATION REPORT  PCP is Pcp, No Referring Provider is Rolan Bucco, MD  Reason for consultation:  acute aortic dissection  HPI:  Patient is a 45 year old male who presented to the emergency department with acute onset aphasia and blurry vision and has been referred for emergent surgical consultation due to discovery of acute type a aortic dissection.  Patient has history of anxiety and depression with longstanding tobacco and alcohol use but no other significant chronic medical problems, although the patient has not seen a physician for routine checkup in many years.  He was reportedly in his usual state of health until approximately 12 noon today when he was out mowing the lawn and he developed sudden onset chest pain radiating to his neck.  The pain seemed to subside but over the next several hours it became apparent the patient had developed severe acute aphasia as well as blurry vision in his right eye.  The patient's wife found him laying in the attic confused and called EMS.  The patient was noted to have severe receptive and expressive aphasia.  Code stroke was activated and the patient was brought to the emergency department at [facility] where initial CT of the head revealed no acute intracranial hemorrhage and only subtle findings but CT angiogram revealed acute dissection involving the aortic arch extending through the innominate artery into the right common carotid artery.  Emergent cardiothoracic surgical consultation was requested.  Upon my arrival to the emergency department the patient was in the CT scanner and promptly transported back to the emergency department where his wife was at the bedside.  The patient is awake and alert and able to follow some simple commands although confused.  He answers appropriately to his name but he has  difficulty finding words and cannot speak coherently.  He moves all 4 extremities without obvious weakness.  He denies ongoing chest pain but reports some mild headache.  He denies shortness of breath.  Patient is married and lives locally in [place] with his wife.  He is currently out of work.  The patient reports that he smokes on a daily basis and has been drinking more recently since he has been out of a job.  He has not had any significant chronic medical problems.  He does take Xanax on a regular basis but no other prescription medications.  Past Medical History:  Diagnosis Date   Anxiety    Depression     History reviewed. No pertinent surgical history.  History reviewed. No pertinent family history.  Social History   Socioeconomic History   Marital status: Single    Spouse name: Not on file   Number of children: Not on file   Years of education: Not on file   Highest education level: Not on file  Occupational History   Not on file  Tobacco Use   Smoking status: Not on file   Smokeless tobacco: Not on file  Substance and Sexual Activity   Alcohol use: Not on file   Drug use: Not on file   Sexual activity: Not on file  Other Topics Concern   Not on file  Social History Narrative   Not on file   Social Determinants of Health   Financial Resource Strain:  Not on file  Food Insecurity: Not on file  Transportation Needs: Not on file  Physical Activity: Not on file  Stress: Not on file  Social Connections: Not on file  Intimate Partner Violence: Not on file    Prior to Admission medications   Not on File    Current Facility-Administered Medications  Medication Dose Route Frequency Provider Last Rate Last Admin   ceFAZolin (ANCEF) IVPB 2g/100 mL premix  2 g Intravenous To OR Purcell Nails, MD       ceFAZolin (ANCEF) IVPB 2g/100 mL premix  2 g Intravenous To OR Purcell Nails, MD       dexmedetomidine (PRECEDEX) 400 MCG/100ML (4 mcg/mL) infusion  0.1-0.7  mcg/kg/hr Intravenous To OR Purcell Nails, MD       EPINEPHrine (ADRENALIN) 5 mg in NS 250 mL (0.02 mg/mL) premix infusion  0-10 mcg/min Intravenous To OR Purcell Nails, MD       heparin 30,000 units/NS 1000 mL solution for CELLSAVER   Other To OR Purcell Nails, MD       heparin sodium (porcine) 5,000 Units, papaverine 60 mg in electrolyte-A (PLASMALYTE-A PH 7.4) 1,000 mL irrigation   Irrigation To OR Purcell Nails, MD       insulin regular, human (MYXREDLIN) 100 units/ 100 mL infusion   Intravenous To OR Purcell Nails, MD       [facility] Blood Cardioplegia vial (lidocaine/magnesium/mannitol 1.61W-9U-0.4V)   Intracoronary Once Purcell Nails, MD       milrinone (PRIMACOR) 20 MG/100 ML (0.2 mg/mL) infusion  0.3 mcg/kg/min Intravenous To OR Purcell Nails, MD       nitroGLYCERIN 50 mg in dextrose 5 % 250 mL (0.2 mg/mL) infusion  2-200 mcg/min Intravenous To OR Purcell Nails, MD       norepinephrine (LEVOPHED)  in premix infusion  0-40 mcg/min Intravenous To OR Purcell Nails, MD       phenylephrine (NEOSYNEPHRINE) 20-0.9 MG/250ML-% infusion  30-200 mcg/min Intravenous To OR Purcell Nails, MD       potassium chloride injection 80 mEq  80 mEq Other To OR Purcell Nails, MD       sodium chloride flush (NS) 0.9 % injection 3 mL  3 mL Intravenous Once Rolan Bucco, MD       tranexamic acid (CYKLOKAPRON) 2,500 mg in sodium chloride 0.9 % 250 mL (10 mg/mL) infusion  1.5 mg/kg/hr Intravenous To OR Purcell Nails, MD       tranexamic acid (CYKLOKAPRON) bolus via infusion - over 30 minutes 990 mg  15 mg/kg Intravenous To OR Purcell Nails, MD       tranexamic acid (CYKLOKAPRON) pump prime solution 132 mg  2 mg/kg Intracatheter To OR Purcell Nails, MD       vancomycin (VANCOCIN) 1,000 mg in sodium chloride 0.9 % 1,000 mL irrigation   Irrigation To OR Purcell Nails, MD       vancomycin (VANCOREADY) IVPB 1250 mg/250 mL  1,250 mg Intravenous To OR Purcell Nails,  MD       No current outpatient medications on file.    Not on File    Review of Systems:   Per HPI - remainder non-contributory    Physical Exam:   BP (!) 163/100   Pulse (!) 55   Resp 14   Wt 66 kg   SpO2 100%   General:  Thin male who appears emotionally upset but  is otherwise in no distress    HEENT:  Unremarkable other than poor dentition  Neck:   no JVD, no bruits, no adenopathy   Chest:   clear to auscultation, symmetrical breath sounds, no wheezes, no rhonchi   CV:   RRR, no murmur   Abdomen:  soft, non-tender, no masses   Extremities:  warm, well-perfused, pulses palpable, no lower extremity edema  Rectal/GU  Deferred  Neuro:   PERRL, severe receptive and expressive aphasia, no obvious facial asymetry, moves all 4 extremities  Skin:   Clean and dry, no rashes, no breakdown  Diagnostic Tests:  Lab Results: Recent Labs    11/04/20 1837 11/04/20 1840  WBC 12.7*  --   HGB 14.5 14.6  HCT 42.4 43.0  PLT 170  --    BMET:  Recent Labs    11/04/20 1837 11/04/20 1840  NA 133* 134*  K 4.5 4.7  CL 100 101  CO2 25  --   GLUCOSE 112* 105*  BUN 19 19  CREATININE 1.32* 1.40*  CALCIUM 9.1  --     CBG (last 3)  Recent Labs    11/04/20 1831  GLUCAP 105*   PT/INR:   Recent Labs    11/04/20 1837  LABPROT 12.9  INR 1.0    CXR:  N/A   CT HEAD WITHOUT CONTRAST   TECHNIQUE: Contiguous axial images were obtained from the base of the skull through the vertex without intravenous contrast.   COMPARISON:  None.   FINDINGS: Brain: There is no acute intracranial hemorrhage or mass effect. Question small subtle loss of gray-white differentiation in the right insular region. Ventricles and sulci are normal in size and configuration.   Vascular: Question of hyperdensity along distal right M2 or proximal M3 MCA branch in the posterior sylvian fissure.   Skull: Unremarkable.   Sinuses/Orbits: No acute abnormality.   Other: Mastoid air cells are  clear.   ASPECTS [facility] Stroke Program Early CT Score)   - Ganglionic level infarction (caudate, lentiform nuclei, internal capsule, insula, M1-M3 cortex): 6   - Supraganglionic infarction (M4-M6 cortex): 3   Total score (0-10 with 10 being normal): 9   IMPRESSION: No acute intracranial hemorrhage. Question subtle small loss of gray-white differentiation in the right insular region (ASPECT score is 9).   Possible hyperdense distal right M2 or proximal M3 MCA branch in the posterior sylvian fissure.   These results were communicated to Dr. Iver Nestle at 6:45 pm on 11/04/2020 by text page via the [facility] messaging system.     Electronically Signed   By: Guadlupe Spanish M.D.   On: 11/04/2020 18:59   CT ANGIOGRAPHY HEAD AND NECK   TECHNIQUE: Multidetector CT imaging of the head and neck was performed using the standard protocol during bolus administration of intravenous contrast. Multiplanar CT image reconstructions and MIPs were obtained to evaluate the vascular anatomy. Carotid stenosis measurements (when applicable) are obtained utilizing NASCET criteria, using the distal internal carotid diameter as the denominator.   CONTRAST:  29mL OMNIPAQUE IOHEXOL 350 MG/ML SOLN   COMPARISON:  None.   FINDINGS: CTA NECK   Aortic arch: A dissection flap is present within the included ascending aorta extending into the transverse portion of the arch but not the descending portion. Dissection involves the innominate artery with thrombosis of the false lumen. This continues into the right common carotid. Left common carotid and left subclavian artery origins are patent.   Right carotid system: As noted above, there is extension  of dissection into the common carotid with occlusion of the false lumen resulting in narrowing of the true lumen. Minimal diameter of 1.5 mm. The external carotid origin is patent. There is minimal contrast enhancement at the ICA origin and subsequent minimal  enhancement along portions of the cervical ICA.   Left carotid system: Patent. Trace calcified plaque along the proximal ICA. No stenosis.   Vertebral arteries: Patent.  Right vertebral artery is dominant.   Skeleton: Degenerative changes of the cervical spine.   Other neck: Unremarkable.   Upper chest: Included upper lungs are clear.   Review of the MIP images confirms the above findings   CTA HEAD   Anterior circulation: Possible faint enhancement of the intracranial right internal carotid artery. There is reconstitution at the terminus. Right M1 and proximal M2 MCA are patent. There is occlusion of a small right distal M2 and proximal M3 MCA branch corresponding to abnormality on noncontrast CT. Left middle and both anterior cerebral arteries are patent. Anterior communicating artery is present.   Posterior circulation: Intracranial vertebral arteries are patent. The left vertebral artery becomes small in caliber after PICA origin. Basilar artery is patent. Major cerebellar artery origins are patent. Posterior cerebral arteries are patent there is a left posterior communicating artery.   Venous sinuses: Patent as allowed by contrast bolus timing.   Review of the MIP images confirms the above findings   IMPRESSION: Partially imaged type A aortic dissection involving the ascending aorta and transverse portion but not the descending aorta.   Dissection involves innominate artery with thrombosis of false lumen. This extends into the right common carotid with narrowing of the true lumen to a minimal diameter of 1.5 mm. Minimal enhancement within the right cervical and intracranial ICA.   Reconstitution at the right ICA terminus. No proximal intracranial vessel occlusion. 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   These results were called by telephone at the time of interpretation on 11/04/2020 at 6:59  pm to provider [facility] , who verbally acknowledged these results.     Electronically Signed   By: Guadlupe Spanish M.D.   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  Patient needs emergent surgical repair.   Plan:  I have discussed the nature of the patient's problem with the patient and his wife at the bedside in the emergency department.  I briefly described the pathophysiology of acute aortic dissection and the fact that the dissection is the cause of the patient's acute stroke.  We discussed treatment options including the highly lethal nature of continued medical therapy without surgical intervention.  We briefly discussed surgical plan including replacement of the ascending aortic possibly with replacement of the transverse aortic arch with or without need for concomitant intervention on the aortic valve.  We discussed the fact that the patient is already had a stroke and whether or not the patient's neurologic symptoms improved postoperatively can be difficult to predict regardless of how well the patient does otherwise.  The patient's wife provides full informed consent for the procedure as planned.  The patient also expresses desire to proceed with surgery and seems to understand the circumstances.  All of their questions have been answered.   I spent in excess of 60 minutes during the conduct of this hospital consultation and >50% of this time involved direct face-to-face encounter for  counseling and/or coordination of the patient's care.     Salvatore Decentlarence H. Cornelius Moraswen, MD 11/04/2020 8:01 PM

## 2020-11-04 NOTE — Anesthesia Procedure Notes (Signed)
Arterial Line Insertion Start/End7/03/2021 8:20 AM, 11/04/2020 8:25 PM Performed by: Gwenyth Allegra, CRNA, CRNA  Preanesthetic checklist: patient identified, IV checked, site marked, risks and benefits discussed, surgical consent, monitors and equipment checked, pre-op evaluation, timeout performed and anesthesia consent Right, radial was placed Catheter size: 20 G Hand hygiene performed , maximum sterile barriers used  and Seldinger technique used Allen's test indicative of satisfactory collateral circulation Attempts: 2 Procedure performed without using ultrasound guided technique. Following insertion, dressing applied and Biopatch. Post procedure assessment: normal  Patient tolerated the procedure well with no immediate complications.

## 2020-11-04 NOTE — Anesthesia Procedure Notes (Signed)
Central Venous Catheter Insertion Performed by: Lewie Loron, MD, anesthesiologist Start/End7/03/2021 8:48 PM, 11/04/2020 8:58 PM Patient location: Pre-op. Preanesthetic checklist: patient identified, IV checked, site marked, risks and benefits discussed, surgical consent, monitors and equipment checked, pre-op evaluation, timeout performed and anesthesia consent Hand hygiene performed  and maximum sterile barriers used  PA cath was placed.Swan type:thermodilution PA Cath depth:50 Procedure performed without using ultrasound guided technique. Attempts: 1 Post procedure assessment: free fluid flow and no air  Patient tolerated the procedure well with no immediate complications.

## 2020-11-04 NOTE — Consult Note (Signed)
Neurology Consultation Reason for Consult: Code stroke Requesting Physician: Dorian Furnace  CC: Confusion  History is obtained from: EMS given family was not available at the time of my evaluation  HPI: Jesse [name] is a 45 y.o. male with a past medical history significant for anxiety and depression, not on any home medications.  He had been working outside with no complaints of recent illness etc. and had come in around 1 PM at which time he seemed normal.  He had gone up to the attic and wife discovered at 4:30 PM that he was confused, complaining of a headache and blurry vision.  EMS was activated but on their arrival it took over an hour to convince patient to come to the hospital for further evaluation.  There were no focal deficits on their evaluation other than aphasia.  LKW: 1 PM tPA given?: No, due to aortic dissection on imaging and out of the window IA performed?: No, due to priority of addressing aortic dissection and difficulty accessing clot across dissection with high risk of worsening the process Premorbid modified rankin scale:      0 - No symptoms.  ROS: Unable to obtain due to altered mental status.   Past Medical History:  Diagnosis Date   Anxiety    Depression    History reviewed. No pertinent family history. Unable to obtain  Social History:  reports that he has been smoking cigarettes. He does not have any smokeless tobacco history on file. He reports current alcohol use. He reports previous drug use.  Unable to confirm with patient   Exam: Current vital signs: BP (!) 163/100   Pulse (!) 55   Resp 14   Wt 66 kg   SpO2 100%  Vital signs in last 24 hours: Pulse Rate:  [55-62] 55 (07/12 1941) Resp:  [14-18] 14 (07/12 1941) BP: (131-163)/(76-100) 163/100 (07/12 1941) SpO2:  [96 %-100 %] 100 % (07/12 1941) Weight:  [66 kg] 66 kg (07/12 1916)   Physical Exam  Constitutional: Appears well-developed and well-nourished.  Psych: Affect appropriate to  situation, mildly frustrated at times secondary to communication difficulties.  Tearful when IV access and blood work was being obtained Eyes: No scleral injection HENT: No oropharyngeal obstruction.  MSK: no joint deformities.  Cardiovascular: Normal rate and regular rhythm.  Respiratory: Effort normal, non-labored breathing GI: Soft.  No distension. There is no tenderness.  Skin: Warm dry and intact visible skin  Neuro: Mental Status: Patient is awake, alert, but cannot answer any orientation questions correctly.  He can mimic examiner but does not reliably follow any commands.  He makes some minimal usable speech, most of which was simply automatic speech Cranial Nerves: II: Visual Fields are full to orienting to stimuli. Pupils are equal, round, and reactive to light.  III,IV, VI: EOMI to tracking examiner V: Facial sensation is symmetric to light eyelash brush VII: Facial movement is symmetric.  VIII: hearing is intact to voice Remainder unable to assess given mental status Motor: Tone is normal.  He does not participate in formal strength testing secondary to aphasia but appears to move all extremities grossly equally.  Based on the fact that he reaches for a pen with his right hand, suspect that the patient may be right-handed Sensory: Equally reactive to touch in all 4 extremities Cerebellar: Intact to fist bump/high-five bilaterally  NIHSS total 7 Score breakdown: 2 points for incorrect answers to orientation questions, 2 points for not following commands, 2 points for severe aphasia, one-point  for mild dysarthria   I have reviewed labs in epic and the results pertinent to this consultation are: Creatinine 1.32, sodium mildly low at 133, mild leukocytosis at 12.7 otherwise CBC within normal limits.  Mildly elevated glucose at 112  I have reviewed the images obtained:  Head CT, personally reviewed and agree with radiology: "No acute intracranial hemorrhage. Question subtle  small loss of gray-white differentiation in the right insular region (ASPECT score is 9).   Possible hyperdense distal right M2 or proximal M3 MCA branch in the posterior sylvian fissure."   IMPRESSION: "Partially imaged type A aortic dissection involving the ascending aorta and transverse portion but not the descending aorta.   Dissection involves innominate artery with thrombosis of false lumen. This extends into the right common carotid with narrowing of the true lumen to a minimal diameter of 1.5 mm. Minimal enhancement within the right cervical and intracranial ICA.   Reconstitution at the right ICA terminus. No proximal intracranial vessel occlusion. Occlusion of small distal right M2 and proximal M3 MCA branch corresponding to noncontrast head CT finding."     Impression: Acute ischemic right MCA territory stroke in the setting of acute aortic dissection.  Interestingly the patient's examination is notable only for aphasia which would typically localized to the left hemisphere even in the majority of left-handed patients -- and he reached for a pen with his right hand suggesting he may be right-handed.  Unfortunately his acute aortic dissection which is likely the etiology of his stroke precludes tPA or intra-arterial thrombectomy at this time.  Case discussed with Dr. Corliss Skains as well, who agreed that the risks of trying to cross the dissection to attempt thrombectomy on a branch MCA occlusion likely would outweigh the neurologic benefit.  Especially in a patient who is young with currently minimal symptoms, if he does not have further strokes, he may make a very good recovery.  The critical issue at this time, which is life-threatening, is management of the acute aortic dissection, and I greatly appreciate cardiothoracic surgery's excellent assistance  Recommendations: # Right MCA territory embolic stroke in the setting of aortic dissection - Stroke labs HgbA1c, fasting lipid  panel - MRI brain once patient stabilized - Frequent neuro checks - Echocardiogram - Defer initiation of antiplatelets when safe per cardiothoracic team - Please avoid anticoagulation outside of emergent life threatening indication until MRI brain is obtained to determine final size of stroke.  If anticoagulation is absolutely required, recommend a low goal no bolus protocol - Risk factor modification - Telemetry monitoring; 30 day event monitor on discharge if no arrythmias captured  - Blood pressure goal --ideally patient would be managed with permissive hypertension but reassuringly his neurological examination has been stable with blood pressures of 130s.  If aortic dissection will tolerate would recommend goal systolic blood pressures of 120s to 140s at this time, but management of aortic dissection of course takes precedence - PT consult, OT consult, Speech consult once patient is stabilized - Stroke team to follow  Brooke Dare MD-PhD [facility] [phone] Available 7 AM to 7 PM, outside these hours please contact Neurologist on call listed on AMION   Total critical care time: 50 minutes   Critical care time was exclusive of separately billable procedures and treating other patients.   Critical care was necessary to treat or prevent imminent or life-threatening deterioration.   Critical care was time spent personally by me on the following activities: development of treatment plan with patient and/or surrogate as  well as nursing, discussions with consultants/primary team, evaluation of patient's response to treatment, examination of patient, obtaining history from patient or surrogate, ordering and performing treatments and interventions, ordering and review of laboratory studies, ordering and review of radiographic studies, and re-evaluation of patient's condition as needed, as documented above.  Case discussed with neuro interventional radiology on-call, ED attending  and neuroradiology

## 2020-11-04 NOTE — ED Notes (Signed)
Wife at bedside. Wife reports pt was doing yard work when he clutched his chest, reported vision changes, aphasia. Reports transient chest pain and headaches in the past. Does not see PCP.

## 2020-11-04 NOTE — Progress Notes (Signed)
  Echocardiogram Echocardiogram Transesophageal has been performed.  Jesse [name] 11/04/2020, 8:54 PM

## 2020-11-04 NOTE — Anesthesia Procedure Notes (Signed)
Arterial Line Insertion Start/End7/03/2021 8:15 AM, 11/04/2020 8:20 AM Performed by: Tillman Abide, CRNA, CRNA  Preanesthetic checklist: patient identified, IV checked, site marked, risks and benefits discussed, surgical consent, monitors and equipment checked, pre-op evaluation, timeout performed and anesthesia consent Left, radial was placed Catheter size: 20 G Hand hygiene performed , maximum sterile barriers used  and Seldinger technique used Allen's test indicative of satisfactory collateral circulation Attempts: 1 Procedure performed without using ultrasound guided technique. Following insertion, dressing applied and Biopatch. Patient tolerated the procedure well with no immediate complications.

## 2020-11-04 NOTE — Code Documentation (Signed)
Pt is a 45 yr old male who was last known well today at 1300 per wife. Later this afternoon (around 4 pm), pt was noted to be in the attic, not making any sense with his words, so EMS was activated. EMS took a while to coax the pt out of his attic as he was afraid and not understanding their commands. Code stroke was activated at 1814 and pt arrived at [facility] at 1827. Blood drawn, CBG taken and airway cleared at bridge. Pt was taken to CT at 1833. CTNC negative for acute hemorrhage per Dr Iver Nestle. Pt remained aphasic - he cannot follow verbal commands or express himself beyond a few phrases like "oh, man", "it's all good" "yeah". He appears anxious. CTA obtained. Per Dr Iver Nestle, CTA shows aortic arch dissection. Pulses and BPs equal and strong in both arms. CTS notified of patient by Dr Fredderick Phenix, EDP. Further care will be led by CTS at this time. Thrombolytic not given as contraindicated. NIR not pursued at this time as CTS currently leading treatment decisions. Bedside handoff with [place] RN.

## 2020-11-04 NOTE — Anesthesia Preprocedure Evaluation (Addendum)
Anesthesia Evaluation  Patient identified by MRN, date of birth, ID band Patient awake  General Assessment Comment:Aphasic  Reviewed: Allergy & Precautions, Patient's Chart, lab work & pertinent test results, Unable to perform ROS - Chart review onlyPreop documentation limited or incomplete due to emergent nature of procedure.  Airway Mallampati: II  TM Distance: >3 FB Neck ROM: Full    Dental  (+) Dental Advisory Given, Edentulous Upper, Poor Dentition   Pulmonary Current Smoker,    Pulmonary exam normal breath sounds clear to auscultation       Cardiovascular negative cardio ROS Normal cardiovascular exam Rhythm:Regular Rate:Normal     Neuro/Psych PSYCHIATRIC DISORDERS Anxiety Depression Aphasic    GI/Hepatic negative GI ROS, Neg liver ROS,   Endo/Other  negative endocrine ROS  Renal/GU negative Renal ROS     Musculoskeletal negative musculoskeletal ROS (+)   Abdominal   Peds  Hematology negative hematology ROS (+)   Anesthesia Other Findings   Reproductive/Obstetrics                            Anesthesia Physical Anesthesia Plan  ASA: 4 and emergent  Anesthesia Plan: General   Post-op Pain Management:    Induction: Intravenous  PONV Risk Score and Plan: 3 and Treatment may vary due to age or medical condition and Midazolam  Airway Management Planned: Oral ETT  Additional Equipment: Arterial line, CVP, PA Cath, TEE and Ultrasound Guidance Line Placement  Intra-op Plan: Utilization Of Total Body Hypothermia per surgeon request and Delibrate Circulatory arrest per surgeon request  Post-operative Plan: Post-operative intubation/ventilation  Informed Consent: I have reviewed the patients History and Physical, chart, labs and discussed the procedure including the risks, benefits and alternatives for the proposed anesthesia with the patient or authorized representative who has  indicated his/her understanding and acceptance.     History available from chart only and Only emergency history available  Plan Discussed with: CRNA  Anesthesia Plan Comments:        Anesthesia Quick Evaluation

## 2020-11-05 ENCOUNTER — Encounter (HOSPITAL_COMMUNITY): Payer: Self-pay | Admitting: Thoracic Surgery (Cardiothoracic Vascular Surgery)

## 2020-11-05 ENCOUNTER — Other Ambulatory Visit: Payer: Self-pay

## 2020-11-05 ENCOUNTER — Inpatient Hospital Stay (HOSPITAL_COMMUNITY): Payer: Medicaid Other

## 2020-11-05 DIAGNOSIS — G9341 Metabolic encephalopathy: Secondary | ICD-10-CM | POA: Diagnosis not present

## 2020-11-05 DIAGNOSIS — I82612 Acute embolism and thrombosis of superficial veins of left upper extremity: Secondary | ICD-10-CM | POA: Diagnosis not present

## 2020-11-05 DIAGNOSIS — E43 Unspecified severe protein-calorie malnutrition: Secondary | ICD-10-CM | POA: Diagnosis not present

## 2020-11-05 DIAGNOSIS — I63511 Cerebral infarction due to unspecified occlusion or stenosis of right middle cerebral artery: Secondary | ICD-10-CM | POA: Diagnosis not present

## 2020-11-05 DIAGNOSIS — R5381 Other malaise: Secondary | ICD-10-CM | POA: Diagnosis not present

## 2020-11-05 DIAGNOSIS — Z20822 Contact with and (suspected) exposure to covid-19: Secondary | ICD-10-CM | POA: Diagnosis not present

## 2020-11-05 DIAGNOSIS — R9431 Abnormal electrocardiogram [ECG] [EKG]: Secondary | ICD-10-CM | POA: Diagnosis not present

## 2020-11-05 DIAGNOSIS — R1312 Dysphagia, oropharyngeal phase: Secondary | ICD-10-CM

## 2020-11-05 DIAGNOSIS — I6330 Cerebral infarction due to thrombosis of unspecified cerebral artery: Secondary | ICD-10-CM | POA: Diagnosis not present

## 2020-11-05 DIAGNOSIS — D649 Anemia, unspecified: Secondary | ICD-10-CM | POA: Diagnosis not present

## 2020-11-05 DIAGNOSIS — J9811 Atelectasis: Secondary | ICD-10-CM | POA: Diagnosis not present

## 2020-11-05 DIAGNOSIS — I7771 Dissection of carotid artery: Secondary | ICD-10-CM | POA: Diagnosis not present

## 2020-11-05 DIAGNOSIS — I7101 Dissection of thoracic aorta: Secondary | ICD-10-CM | POA: Diagnosis present

## 2020-11-05 DIAGNOSIS — G9340 Encephalopathy, unspecified: Secondary | ICD-10-CM | POA: Diagnosis not present

## 2020-11-05 DIAGNOSIS — R509 Fever, unspecified: Secondary | ICD-10-CM | POA: Diagnosis not present

## 2020-11-05 DIAGNOSIS — D6959 Other secondary thrombocytopenia: Secondary | ICD-10-CM | POA: Diagnosis not present

## 2020-11-05 DIAGNOSIS — I351 Nonrheumatic aortic (valve) insufficiency: Secondary | ICD-10-CM | POA: Diagnosis not present

## 2020-11-05 DIAGNOSIS — E871 Hypo-osmolality and hyponatremia: Secondary | ICD-10-CM | POA: Diagnosis not present

## 2020-11-05 DIAGNOSIS — R410 Disorientation, unspecified: Secondary | ICD-10-CM | POA: Diagnosis not present

## 2020-11-05 DIAGNOSIS — D62 Acute posthemorrhagic anemia: Secondary | ICD-10-CM | POA: Diagnosis not present

## 2020-11-05 DIAGNOSIS — I6340 Cerebral infarction due to embolism of unspecified cerebral artery: Secondary | ICD-10-CM | POA: Insufficient documentation

## 2020-11-05 DIAGNOSIS — F17210 Nicotine dependence, cigarettes, uncomplicated: Secondary | ICD-10-CM | POA: Diagnosis present

## 2020-11-05 DIAGNOSIS — I809 Phlebitis and thrombophlebitis of unspecified site: Secondary | ICD-10-CM | POA: Diagnosis not present

## 2020-11-05 DIAGNOSIS — I119 Hypertensive heart disease without heart failure: Secondary | ICD-10-CM | POA: Diagnosis present

## 2020-11-05 DIAGNOSIS — I63131 Cerebral infarction due to embolism of right carotid artery: Secondary | ICD-10-CM

## 2020-11-05 DIAGNOSIS — R4701 Aphasia: Secondary | ICD-10-CM | POA: Diagnosis present

## 2020-11-05 DIAGNOSIS — H5461 Unqualified visual loss, right eye, normal vision left eye: Secondary | ICD-10-CM | POA: Diagnosis present

## 2020-11-05 DIAGNOSIS — I10 Essential (primary) hypertension: Secondary | ICD-10-CM | POA: Diagnosis present

## 2020-11-05 DIAGNOSIS — G8194 Hemiplegia, unspecified affecting left nondominant side: Secondary | ICD-10-CM | POA: Diagnosis not present

## 2020-11-05 DIAGNOSIS — F05 Delirium due to known physiological condition: Secondary | ICD-10-CM | POA: Diagnosis not present

## 2020-11-05 DIAGNOSIS — L03114 Cellulitis of left upper limb: Secondary | ICD-10-CM | POA: Diagnosis not present

## 2020-11-05 DIAGNOSIS — F1910 Other psychoactive substance abuse, uncomplicated: Secondary | ICD-10-CM | POA: Diagnosis not present

## 2020-11-05 DIAGNOSIS — R29707 NIHSS score 7: Secondary | ICD-10-CM | POA: Diagnosis present

## 2020-11-05 DIAGNOSIS — I6521 Occlusion and stenosis of right carotid artery: Secondary | ICD-10-CM | POA: Diagnosis not present

## 2020-11-05 DIAGNOSIS — Z681 Body mass index (BMI) 19 or less, adult: Secondary | ICD-10-CM | POA: Diagnosis not present

## 2020-11-05 DIAGNOSIS — J9601 Acute respiratory failure with hypoxia: Secondary | ICD-10-CM | POA: Diagnosis not present

## 2020-11-05 DIAGNOSIS — E162 Hypoglycemia, unspecified: Secondary | ICD-10-CM | POA: Diagnosis not present

## 2020-11-05 DIAGNOSIS — I71019 Dissection of thoracic aorta, unspecified: Secondary | ICD-10-CM | POA: Diagnosis present

## 2020-11-05 DIAGNOSIS — I63411 Cerebral infarction due to embolism of right middle cerebral artery: Secondary | ICD-10-CM | POA: Diagnosis not present

## 2020-11-05 DIAGNOSIS — Z9889 Other specified postprocedural states: Secondary | ICD-10-CM | POA: Diagnosis not present

## 2020-11-05 DIAGNOSIS — G936 Cerebral edema: Secondary | ICD-10-CM | POA: Diagnosis not present

## 2020-11-05 HISTORY — DX: Other specified postprocedural states: Z98.890

## 2020-11-05 LAB — BASIC METABOLIC PANEL
Anion gap: 7 (ref 5–15)
Anion gap: 6 (ref 5–15)
Anion gap: 8 (ref 5–15)
BUN: 14 mg/dL (ref 6–20)
BUN: 13 mg/dL (ref 6–20)
BUN: 13 mg/dL (ref 6–20)
CO2: 25 mmol/L (ref 22–32)
CO2: 24 mmol/L (ref 22–32)
CO2: 24 mmol/L (ref 22–32)
Calcium: 8.4 mg/dL — ABNORMAL LOW (ref 8.9–10.3)
Calcium: 8.1 mg/dL — ABNORMAL LOW (ref 8.9–10.3)
Calcium: 7.7 mg/dL — ABNORMAL LOW (ref 8.9–10.3)
Chloride: 104 mmol/L (ref 98–111)
Chloride: 106 mmol/L (ref 98–111)
Chloride: 103 mmol/L (ref 98–111)
Creatinine, Ser: 1.15 mg/dL (ref 0.61–1.24)
Creatinine, Ser: 1.18 mg/dL (ref 0.61–1.24)
Creatinine, Ser: 1.18 mg/dL (ref 0.61–1.24)
GFR, Estimated: >60 mL/min (ref >60)
GFR, Estimated: >60 mL/min (ref >60)
GFR, Estimated: >60 mL/min (ref >60)
Glucose, Bld: 123 mg/dL — ABNORMAL HIGH (ref 70–99)
Glucose, Bld: 134 mg/dL — ABNORMAL HIGH (ref 70–99)
Glucose, Bld: 147 mg/dL — ABNORMAL HIGH (ref 70–99)
Potassium: 3.9 mmol/L (ref 3.5–5.1)
Potassium: 4 mmol/L (ref 3.5–5.1)
Potassium: 3.6 mmol/L (ref 3.5–5.1)
Sodium: 136 mmol/L (ref 135–145)
Sodium: 136 mmol/L (ref 135–145)
Sodium: 135 mmol/L (ref 135–145)

## 2020-11-05 LAB — POCT I-STAT 7, (LYTES, BLD GAS, ICA,H+H)
Acid-Base Excess: 1 mmol/L (ref 0.0–2.0)
Acid-base deficit: 1 mmol/L (ref 0.0–2.0)
Acid-base deficit: 1 mmol/L (ref 0.0–2.0)
Acid-base deficit: 2 mmol/L (ref 0.0–2.0)
Acid-base deficit: 4 mmol/L — ABNORMAL HIGH (ref 0.0–2.0)
Bicarbonate: 22.2 mmol/L (ref 20.0–28.0)
Bicarbonate: 23.8 mmol/L (ref 20.0–28.0)
Bicarbonate: 24.6 mmol/L (ref 20.0–28.0)
Bicarbonate: 24.9 mmol/L (ref 20.0–28.0)
Bicarbonate: 26.2 mmol/L (ref 20.0–28.0)
Calcium, Ion: 0.93 mmol/L — ABNORMAL LOW (ref 1.15–1.40)
Calcium, Ion: 0.98 mmol/L — ABNORMAL LOW (ref 1.15–1.40)
Calcium, Ion: 1.16 mmol/L (ref 1.15–1.40)
Calcium, Ion: 1.17 mmol/L (ref 1.15–1.40)
Calcium, Ion: 1.28 mmol/L (ref 1.15–1.40)
HCT: 22 % — ABNORMAL LOW (ref 39.0–52.0)
HCT: 24 % — ABNORMAL LOW (ref 39.0–52.0)
HCT: 28 % — ABNORMAL LOW (ref 39.0–52.0)
HCT: 29 % — ABNORMAL LOW (ref 39.0–52.0)
HCT: 30 % — ABNORMAL LOW (ref 39.0–52.0)
Hemoglobin: 10.2 g/dL — ABNORMAL LOW (ref 13.0–17.0)
Hemoglobin: 7.5 g/dL — ABNORMAL LOW (ref 13.0–17.0)
Hemoglobin: 8.2 g/dL — ABNORMAL LOW (ref 13.0–17.0)
Hemoglobin: 9.5 g/dL — ABNORMAL LOW (ref 13.0–17.0)
Hemoglobin: 9.9 g/dL — ABNORMAL LOW (ref 13.0–17.0)
O2 Saturation: 100 %
O2 Saturation: 100 %
O2 Saturation: 95 %
O2 Saturation: 98 %
O2 Saturation: 98 %
Patient temperature: 37.1
Patient temperature: 97.4
Potassium: 3.8 mmol/L (ref 3.5–5.1)
Potassium: 3.9 mmol/L (ref 3.5–5.1)
Potassium: 3.9 mmol/L (ref 3.5–5.1)
Potassium: 4.6 mmol/L (ref 3.5–5.1)
Potassium: 5.2 mmol/L — ABNORMAL HIGH (ref 3.5–5.1)
Sodium: 129 mmol/L — ABNORMAL LOW (ref 135–145)
Sodium: 135 mmol/L (ref 135–145)
Sodium: 138 mmol/L (ref 135–145)
Sodium: 138 mmol/L (ref 135–145)
Sodium: 138 mmol/L (ref 135–145)
TCO2: 24 mmol/L (ref 22–32)
TCO2: 25 mmol/L (ref 22–32)
TCO2: 26 mmol/L (ref 22–32)
TCO2: 27 mmol/L (ref 22–32)
TCO2: 28 mmol/L (ref 22–32)
pCO2 arterial: 36 mmHg (ref 32.0–48.0)
pCO2 arterial: 42.6 mmHg (ref 32.0–48.0)
pCO2 arterial: 43.7 mmHg (ref 32.0–48.0)
pCO2 arterial: 45.5 mmHg (ref 32.0–48.0)
pCO2 arterial: 56.4 mmHg — ABNORMAL HIGH (ref 32.0–48.0)
pH, Arterial: 7.252 — ABNORMAL LOW (ref 7.350–7.450)
pH, Arterial: 7.326 — ABNORMAL LOW (ref 7.350–7.450)
pH, Arterial: 7.358 (ref 7.350–7.450)
pH, Arterial: 7.368 (ref 7.350–7.450)
pH, Arterial: 7.424 (ref 7.350–7.450)
pO2, Arterial: 103 mmHg (ref 83.0–108.0)
pO2, Arterial: 119 mmHg — ABNORMAL HIGH (ref 83.0–108.0)
pO2, Arterial: 384 mmHg — ABNORMAL HIGH (ref 83.0–108.0)
pO2, Arterial: 385 mmHg — ABNORMAL HIGH (ref 83.0–108.0)
pO2, Arterial: 69 mmHg — ABNORMAL LOW (ref 83.0–108.0)

## 2020-11-05 LAB — POCT I-STAT, CHEM 8
BUN: 16 mg/dL (ref 6–20)
BUN: 16 mg/dL (ref 6–20)
BUN: 18 mg/dL (ref 6–20)
Calcium, Ion: 0.92 mmol/L — ABNORMAL LOW (ref 1.15–1.40)
Calcium, Ion: 0.92 mmol/L — ABNORMAL LOW (ref 1.15–1.40)
Calcium, Ion: 0.93 mmol/L — ABNORMAL LOW (ref 1.15–1.40)
Chloride: 97 mmol/L — ABNORMAL LOW (ref 98–111)
Chloride: 98 mmol/L (ref 98–111)
Chloride: 99 mmol/L (ref 98–111)
Creatinine, Ser: 0.9 mg/dL (ref 0.61–1.24)
Creatinine, Ser: 0.9 mg/dL (ref 0.61–1.24)
Creatinine, Ser: 1 mg/dL (ref 0.61–1.24)
Glucose, Bld: 200 mg/dL — ABNORMAL HIGH (ref 70–99)
Glucose, Bld: 214 mg/dL — ABNORMAL HIGH (ref 70–99)
Glucose, Bld: 236 mg/dL — ABNORMAL HIGH (ref 70–99)
HCT: 21 % — ABNORMAL LOW (ref 39.0–52.0)
HCT: 25 % — ABNORMAL LOW (ref 39.0–52.0)
HCT: 27 % — ABNORMAL LOW (ref 39.0–52.0)
Hemoglobin: 7.1 g/dL — ABNORMAL LOW (ref 13.0–17.0)
Hemoglobin: 8.5 g/dL — ABNORMAL LOW (ref 13.0–17.0)
Hemoglobin: 9.2 g/dL — ABNORMAL LOW (ref 13.0–17.0)
Potassium: 4.2 mmol/L (ref 3.5–5.1)
Potassium: 4.6 mmol/L (ref 3.5–5.1)
Potassium: 5.3 mmol/L — ABNORMAL HIGH (ref 3.5–5.1)
Sodium: 131 mmol/L — ABNORMAL LOW (ref 135–145)
Sodium: 133 mmol/L — ABNORMAL LOW (ref 135–145)
Sodium: 134 mmol/L — ABNORMAL LOW (ref 135–145)
TCO2: 22 mmol/L (ref 22–32)
TCO2: 23 mmol/L (ref 22–32)
TCO2: 24 mmol/L (ref 22–32)

## 2020-11-05 LAB — ECHO INTRAOPERATIVE TEE: Weight: 2328.06 oz

## 2020-11-05 LAB — CBC
HCT: 27.5 % — ABNORMAL LOW (ref 39.0–52.0)
HCT: 27.9 % — ABNORMAL LOW (ref 39.0–52.0)
HCT: 29.4 % — ABNORMAL LOW (ref 39.0–52.0)
Hemoglobin: 9.5 g/dL — ABNORMAL LOW (ref 13.0–17.0)
Hemoglobin: 9.6 g/dL — ABNORMAL LOW (ref 13.0–17.0)
Hemoglobin: 9.8 g/dL — ABNORMAL LOW (ref 13.0–17.0)
MCH: 34.1 pg — ABNORMAL HIGH (ref 26.0–34.0)
MCH: 33.8 pg (ref 26.0–34.0)
MCH: 33.1 pg (ref 26.0–34.0)
MCHC: 34.5 g/dL (ref 30.0–36.0)
MCHC: 34.4 g/dL (ref 30.0–36.0)
MCHC: 33.3 g/dL (ref 30.0–36.0)
MCV: 98.6 fL (ref 80.0–100.0)
MCV: 98.2 fL (ref 80.0–100.0)
MCV: 99.3 fL (ref 80.0–100.0)
Platelets: 99 10*3/uL — ABNORMAL LOW (ref 150–400)
Platelets: 107 10*3/uL — ABNORMAL LOW (ref 150–400)
Platelets: 121 10*3/uL — ABNORMAL LOW (ref 150–400)
RBC: 2.79 MIL/uL — ABNORMAL LOW (ref 4.22–5.81)
RBC: 2.84 MIL/uL — ABNORMAL LOW (ref 4.22–5.81)
RBC: 2.96 MIL/uL — ABNORMAL LOW (ref 4.22–5.81)
RDW: 13.7 % (ref 11.5–15.5)
RDW: 13.9 % (ref 11.5–15.5)
RDW: 14 % (ref 11.5–15.5)
WBC: 8.5 10*3/uL (ref 4.0–10.5)
WBC: 6.8 10*3/uL (ref 4.0–10.5)
WBC: 9.6 10*3/uL (ref 4.0–10.5)
nRBC: 0 % (ref 0.0–0.2)
nRBC: 0 % (ref 0.0–0.2)
nRBC: 0 % (ref 0.0–0.2)

## 2020-11-05 LAB — APTT: aPTT: 34 seconds (ref 24–36)

## 2020-11-05 LAB — GLUCOSE, CAPILLARY
Glucose-Capillary: 119 mg/dL — ABNORMAL HIGH (ref 70–99)
Glucose-Capillary: 123 mg/dL — ABNORMAL HIGH (ref 70–99)
Glucose-Capillary: 125 mg/dL — ABNORMAL HIGH (ref 70–99)
Glucose-Capillary: 129 mg/dL — ABNORMAL HIGH (ref 70–99)
Glucose-Capillary: 133 mg/dL — ABNORMAL HIGH (ref 70–99)
Glucose-Capillary: 135 mg/dL — ABNORMAL HIGH (ref 70–99)
Glucose-Capillary: 135 mg/dL — ABNORMAL HIGH (ref 70–99)
Glucose-Capillary: 138 mg/dL — ABNORMAL HIGH (ref 70–99)
Glucose-Capillary: 143 mg/dL — ABNORMAL HIGH (ref 70–99)
Glucose-Capillary: 149 mg/dL — ABNORMAL HIGH (ref 70–99)
Glucose-Capillary: 152 mg/dL — ABNORMAL HIGH (ref 70–99)
Glucose-Capillary: 158 mg/dL — ABNORMAL HIGH (ref 70–99)
Glucose-Capillary: 165 mg/dL — ABNORMAL HIGH (ref 70–99)

## 2020-11-05 LAB — FIBRINOGEN: Fibrinogen: 159 mg/dL — ABNORMAL LOW (ref 210–475)

## 2020-11-05 LAB — PLATELET COUNT: Platelets: 97 10*3/uL — ABNORMAL LOW (ref 150–400)

## 2020-11-05 LAB — MAGNESIUM
Magnesium: 2.4 mg/dL (ref 1.7–2.4)
Magnesium: 3 mg/dL — ABNORMAL HIGH (ref 1.7–2.4)
Magnesium: 2.5 mg/dL — ABNORMAL HIGH (ref 1.7–2.4)

## 2020-11-05 LAB — PROTIME-INR
INR: 1.3 — ABNORMAL HIGH (ref 0.8–1.2)
Prothrombin Time: 16.5 seconds — ABNORMAL HIGH (ref 11.4–15.2)

## 2020-11-05 LAB — HEMOGLOBIN AND HEMATOCRIT, BLOOD
HCT: 25.9 % — ABNORMAL LOW (ref 39.0–52.0)
Hemoglobin: 8.9 g/dL — ABNORMAL LOW (ref 13.0–17.0)

## 2020-11-05 MED ORDER — PROTAMINE SULFATE 10 MG/ML IV SOLN
INTRAVENOUS | Status: DC | PRN
  Administered 2020-11-05: 200 mg via INTRAVENOUS

## 2020-11-05 MED ORDER — PANTOPRAZOLE SODIUM 40 MG PO TBEC
40.0000 mg | DELAYED_RELEASE_TABLET | Freq: Every day | ORAL | Status: DC
  Administered 2020-11-07: 40 mg via ORAL
  Filled 2020-11-05: qty 1

## 2020-11-05 MED ORDER — FENTANYL CITRATE (PF) 250 MCG/5ML IJ SOLN
INTRAMUSCULAR | Status: AC
  Filled 2020-11-05: qty 5

## 2020-11-05 MED ORDER — MAGNESIUM SULFATE 4 GM/100ML IV SOLN
4.0000 g | Freq: Once | INTRAVENOUS | Status: AC
  Administered 2020-11-05: 4 g via INTRAVENOUS
  Filled 2020-11-05: qty 100

## 2020-11-05 MED ORDER — ACETAMINOPHEN 500 MG PO TABS: 1000.0000 mg | ORAL_TABLET | Freq: Four times a day (QID) | ORAL | Status: AC

## 2020-11-05 MED ORDER — ALBUMIN HUMAN 5 % IV SOLN
250.0000 mL | INTRAVENOUS | Status: DC | PRN
  Administered 2020-11-05: 12.5 g via INTRAVENOUS

## 2020-11-05 MED ORDER — DEXMEDETOMIDINE HCL IN NACL 400 MCG/100ML IV SOLN
0.1000 ug/kg/h | INTRAVENOUS | Status: DC
  Filled 2020-11-05: qty 100

## 2020-11-05 MED ORDER — LABETALOL HCL 5 MG/ML IV SOLN
10.0000 mg | INTRAVENOUS | Status: DC | PRN
  Administered 2020-11-05 – 2020-11-08 (×4): 10 mg via INTRAVENOUS
  Filled 2020-11-05 (×3): qty 4

## 2020-11-05 MED ORDER — DEXTROSE 50 % IV SOLN: 0.0000 mL | INTRAVENOUS | Status: DC | PRN

## 2020-11-05 MED ORDER — VANCOMYCIN HCL IN DEXTROSE 1-5 GM/200ML-% IV SOLN
1000.0000 mg | Freq: Once | INTRAVENOUS | Status: AC
  Administered 2020-11-05: 1000 mg via INTRAVENOUS
  Filled 2020-11-05: qty 200

## 2020-11-05 MED ORDER — INSULIN REGULAR(HUMAN) IN NACL 100-0.9 UT/100ML-% IV SOLN: INTRAVENOUS | Status: DC

## 2020-11-05 MED ORDER — CLEVIDIPINE BUTYRATE 0.5 MG/ML IV EMUL
0.0000 mg/h | INTRAVENOUS | Status: DC
  Administered 2020-11-05 (×2): 8 mg/h via INTRAVENOUS
  Administered 2020-11-05: 5 mg/h via INTRAVENOUS
  Filled 2020-11-05 (×5): qty 100

## 2020-11-05 MED ORDER — PHENYLEPHRINE HCL-NACL 20-0.9 MG/250ML-% IV SOLN: 0.0000 ug/min | INTRAVENOUS | Status: DC

## 2020-11-05 MED ORDER — MANNITOL 20 % IV SOLN
Freq: Once | INTRAVENOUS | Status: DC
  Filled 2020-11-05: qty 13

## 2020-11-05 MED ORDER — TRANEXAMIC ACID 1000 MG/10ML IV SOLN
1.5000 mg/kg/h | INTRAVENOUS | Status: DC
  Filled 2020-11-05: qty 25

## 2020-11-05 MED ORDER — MORPHINE SULFATE (PF) 2 MG/ML IV SOLN
1.0000 mg | INTRAVENOUS | Status: DC | PRN
  Administered 2020-11-05: 2 mg via INTRAVENOUS
  Administered 2020-11-05: 4 mg via INTRAVENOUS
  Administered 2020-11-06: 2 mg via INTRAVENOUS
  Filled 2020-11-05: qty 2
  Filled 2020-11-05 (×2): qty 1

## 2020-11-05 MED ORDER — SODIUM CHLORIDE 0.9 % IV SOLN: INTRAVENOUS | Status: AC

## 2020-11-05 MED ORDER — POTASSIUM CHLORIDE 10 MEQ/50ML IV SOLN
10.0000 meq | INTRAVENOUS | Status: AC
  Administered 2020-11-05 (×3): 10 meq via INTRAVENOUS
  Filled 2020-11-05: qty 50

## 2020-11-05 MED ORDER — ACETAMINOPHEN 160 MG/5ML PO SOLN
1000.0000 mg | Freq: Four times a day (QID) | ORAL | Status: AC
  Administered 2020-11-05 – 2020-11-10 (×12): 1000 mg
  Filled 2020-11-05 (×13): qty 40.6

## 2020-11-05 MED ORDER — LACTATED RINGERS IV SOLN: 500.0000 mL | Freq: Once | INTRAVENOUS | Status: DC | PRN

## 2020-11-05 MED ORDER — DOCUSATE SODIUM 100 MG PO CAPS: 200.0000 mg | ORAL_CAPSULE | Freq: Every day | ORAL | Status: DC

## 2020-11-05 MED ORDER — LACTATED RINGERS IV SOLN: INTRAVENOUS | Status: DC

## 2020-11-05 MED ORDER — VANCOMYCIN HCL 1000 MG IV SOLR
INTRAVENOUS | Status: DC | PRN
  Administered 2020-11-05: 1000 mL

## 2020-11-05 MED ORDER — ONDANSETRON HCL 4 MG/2ML IJ SOLN: 4.0000 mg | Freq: Four times a day (QID) | INTRAMUSCULAR | Status: DC | PRN

## 2020-11-05 MED ORDER — CEFAZOLIN SODIUM-DEXTROSE 2-4 GM/100ML-% IV SOLN
2.0000 g | Freq: Three times a day (TID) | INTRAVENOUS | Status: AC
  Administered 2020-11-05 – 2020-11-06 (×6): 2 g via INTRAVENOUS
  Filled 2020-11-05 (×6): qty 100

## 2020-11-05 MED ORDER — INSULIN ASPART 100 UNIT/ML IJ SOLN
0.0000 [IU] | INTRAMUSCULAR | Status: DC
  Administered 2020-11-05 – 2020-11-10 (×9): 2 [IU] via SUBCUTANEOUS

## 2020-11-05 MED ORDER — CHLORHEXIDINE GLUCONATE CLOTH 2 % EX PADS
6.0000 | MEDICATED_PAD | Freq: Every day | CUTANEOUS | Status: DC
  Administered 2020-11-05 – 2020-11-12 (×7): 6 via TOPICAL

## 2020-11-05 MED ORDER — MIDAZOLAM HCL 2 MG/2ML IJ SOLN
2.0000 mg | INTRAMUSCULAR | Status: DC | PRN
  Administered 2020-11-05 – 2020-11-07 (×2): 2 mg via INTRAVENOUS
  Filled 2020-11-05 (×2): qty 2

## 2020-11-05 MED ORDER — OXYCODONE HCL 5 MG PO TABS
5.0000 mg | ORAL_TABLET | ORAL | Status: DC | PRN
  Administered 2020-11-05: 10 mg via ORAL
  Filled 2020-11-05: qty 2

## 2020-11-05 MED ORDER — BISACODYL 10 MG RE SUPP
10.0000 mg | Freq: Every day | RECTAL | Status: DC
  Administered 2020-11-06: 10 mg via RECTAL
  Filled 2020-11-05: qty 1

## 2020-11-05 MED ORDER — SODIUM CHLORIDE 0.9% FLUSH
3.0000 mL | INTRAVENOUS | Status: DC | PRN
  Administered 2020-11-10: 3 mL via INTRAVENOUS

## 2020-11-05 MED ORDER — SODIUM CHLORIDE 0.9 % IV SOLN: 250.0000 mL | INTRAVENOUS | Status: DC

## 2020-11-05 MED ORDER — BISACODYL 5 MG PO TBEC
10.0000 mg | DELAYED_RELEASE_TABLET | Freq: Every day | ORAL | Status: DC
  Administered 2020-11-07 – 2020-11-12 (×5): 10 mg via ORAL
  Filled 2020-11-05 (×5): qty 2

## 2020-11-05 MED ORDER — SODIUM CHLORIDE 0.45 % IV SOLN: INTRAVENOUS | Status: DC | PRN

## 2020-11-05 MED ORDER — POTASSIUM CHLORIDE 10 MEQ/50ML IV SOLN
10.0000 meq | INTRAVENOUS | Status: AC
  Administered 2020-11-05 (×3): 10 meq via INTRAVENOUS

## 2020-11-05 MED ORDER — ACETAMINOPHEN 160 MG/5ML PO SOLN: 650.0000 mg | Freq: Once | ORAL | Status: DC

## 2020-11-05 MED ORDER — ASPIRIN EC 325 MG PO TBEC: 325.0000 mg | DELAYED_RELEASE_TABLET | Freq: Every day | ORAL | Status: DC

## 2020-11-05 MED ORDER — CLEVIDIPINE BUTYRATE 0.5 MG/ML IV EMUL
0.0000 mg/h | INTRAVENOUS | Status: DC
  Filled 2020-11-05: qty 50

## 2020-11-05 MED ORDER — NITROGLYCERIN IN D5W 200-5 MCG/ML-% IV SOLN: 0.0000 ug/min | INTRAVENOUS | Status: DC

## 2020-11-05 MED ORDER — SODIUM CHLORIDE 0.9% IV SOLUTION: Freq: Once | INTRAVENOUS | Status: DC

## 2020-11-05 MED ORDER — DEXMEDETOMIDINE HCL IN NACL 400 MCG/100ML IV SOLN
0.4000 ug/kg/h | INTRAVENOUS | Status: DC
  Administered 2020-11-05: 0.7 ug/kg/h via INTRAVENOUS
  Administered 2020-11-06: 0.4 ug/kg/h via INTRAVENOUS
  Administered 2020-11-06 – 2020-11-07 (×2): 1.2 ug/kg/h via INTRAVENOUS
  Administered 2020-11-07 – 2020-11-08 (×3): 0.6 ug/kg/h via INTRAVENOUS
  Administered 2020-11-08: 1.2 ug/kg/h via INTRAVENOUS
  Administered 2020-11-09: 0.4 ug/kg/h via INTRAVENOUS
  Administered 2020-11-09: 1.2 ug/kg/h via INTRAVENOUS
  Administered 2020-11-09 (×2): 1 ug/kg/h via INTRAVENOUS
  Administered 2020-11-10: 0.7 ug/kg/h via INTRAVENOUS
  Filled 2020-11-05 (×15): qty 100

## 2020-11-05 MED ORDER — ASPIRIN 81 MG PO CHEW: 324.0000 mg | CHEWABLE_TABLET | Freq: Every day | ORAL | Status: DC

## 2020-11-05 MED ORDER — CALCIUM CHLORIDE 10 % IV SOLN
INTRAVENOUS | Status: DC | PRN
  Administered 2020-11-05 (×3): 100 mg via INTRAVENOUS
  Administered 2020-11-05 (×2): 200 mg via INTRAVENOUS
  Administered 2020-11-05: 100 mg via INTRAVENOUS
  Administered 2020-11-05: 200 mg via INTRAVENOUS

## 2020-11-05 MED ORDER — METOPROLOL TARTRATE 5 MG/5ML IV SOLN: 2.5000 mg | INTRAVENOUS | Status: DC | PRN

## 2020-11-05 MED ORDER — CHLORHEXIDINE GLUCONATE 0.12 % MT SOLN
15.0000 mL | OROMUCOSAL | Status: AC
  Administered 2020-11-05: 15 mL via OROMUCOSAL

## 2020-11-05 MED ORDER — TRAMADOL HCL 50 MG PO TABS: 50.0000 mg | ORAL_TABLET | ORAL | Status: DC | PRN

## 2020-11-05 MED ORDER — FAMOTIDINE IN NACL 20-0.9 MG/50ML-% IV SOLN
20.0000 mg | Freq: Two times a day (BID) | INTRAVENOUS | Status: AC
  Administered 2020-11-05: 20 mg via INTRAVENOUS
  Filled 2020-11-05: qty 50

## 2020-11-05 MED ORDER — ACETAMINOPHEN 650 MG RE SUPP: 650.0000 mg | Freq: Once | RECTAL | Status: DC

## 2020-11-05 MED ORDER — ROCURONIUM BROMIDE 10 MG/ML (PF) SYRINGE
PREFILLED_SYRINGE | INTRAVENOUS | Status: AC
  Filled 2020-11-05: qty 20

## 2020-11-05 MED ORDER — SODIUM CHLORIDE 0.9 % IV SOLN: INTRAVENOUS | Status: DC

## 2020-11-05 MED ORDER — SODIUM CHLORIDE 0.9% FLUSH
3.0000 mL | Freq: Two times a day (BID) | INTRAVENOUS | Status: DC
  Administered 2020-11-06 – 2020-11-07 (×3): 3 mL via INTRAVENOUS

## 2020-11-05 NOTE — Progress Notes (Signed)
STROKE TEAM PROGRESS NOTE   SUBJECTIVE (INTERVAL HISTORY) His father and RN are at the bedside.  Pt lying in bed, with dressing in the middle of the chest and chest tube in place. He is restless and mildly agitated, on b/l wrist restrain. Not following commands and nonverbal, moving all extremities but seems to have some weakness on the left UE.    OBJECTIVE Temp:  [96.44 F (35.8 C)-99.5 F (37.5 C)] 98.42 F (36.9 C) (07/13 1130) Pulse Rate:  [51-110] 56 (07/13 1530) Resp:  [9-28] 20 (07/13 1530) BP: (89-163)/(70-100) 106/71 (07/13 1530) SpO2:  [93 %-100 %] 94 % (07/13 1530) Arterial Line BP: (107-183)/(62-138) 143/138 (07/13 1315) FiO2 (%):  [40 %-50 %] 40 % (07/13 1054) Weight:  [66 kg] 66 kg (07/12 1916)  Recent Labs  Lab 11/05/20 0919 11/05/20 1014 11/05/20 1103 11/05/20 1206 11/05/20 1301  GLUCAP 135* 138* 165* 158* 152*   Recent Labs  Lab 11/04/20 1837 11/04/20 1840 11/05/20 0128 11/05/20 0205 11/05/20 0208 11/05/20 0326 11/05/20 0345 11/05/20 0920 11/05/20 1110 11/05/20 1208 11/05/20 1305  NA 133*   < > 131*   < > 134* 136 138 136 138 135 138  K 4.5   < > 5.3*   < > 4.6 3.9 3.9 4.0 3.9 3.6 3.8  CL 100   < > 97*  --  98 104  --  106  --  103  --   CO2 25  --   --   --   --  25  --  24  --  24  --   GLUCOSE 112*   < > 236*  --  200* 123*  --  134*  --  147*  --   BUN 19   < > 18  --  16 14  --  13  --  13  --   CREATININE 1.32*   < > 1.00  --  0.90 1.15  --  1.18  --  1.18  --   CALCIUM 9.1  --   --   --   --  8.4*  --  8.1*  --  7.7*  --   MG  --   --   --   --   --  2.4  --  3.0*  --  2.5*  --    < > = values in this interval not displayed.   Recent Labs  Lab 11/04/20 1837  AST 20  ALT 15  ALKPHOS 55  BILITOT 0.7  PROT 6.3*  ALBUMIN 3.7   Recent Labs  Lab 11/04/20 1837 11/04/20 1840 11/05/20 0027 11/05/20 0034 11/05/20 0326 11/05/20 0345 11/05/20 0920 11/05/20 1110 11/05/20 1208 11/05/20 1305  WBC 12.7*  --   --   --  8.5  --  6.8  --   9.6  --   NEUTROABS 10.8*  --   --   --   --   --   --   --   --   --   HGB 14.5   < > 8.9*   < > 9.5* 8.2* 9.6* 9.5* 9.8* 10.2*  HCT 42.4   < > 25.9*   < > 27.5* 24.0* 27.9* 28.0* 29.4* 30.0*  MCV 97.2  --   --   --  98.6  --  98.2  --  99.3  --   PLT 170  --  97*  --  99*  --  107*  --  121*  --    < > =  values in this interval not displayed.   No results for input(s): CKTOTAL, CKMB, CKMBINDEX, TROPONINI in the last 168 hours. Recent Labs    11/04/20 1837 11/05/20 0326  LABPROT 12.9 16.5*  INR 1.0 1.3*   No results for input(s): COLORURINE, LABSPEC, PHURINE, GLUCOSEU, HGBUR, BILIRUBINUR, KETONESUR, PROTEINUR, UROBILINOGEN, NITRITE, LEUKOCYTESUR in the last 72 hours.  Invalid input(s): APPERANCEUR  No results found for: CHOL, TRIG, HDL, CHOLHDL, VLDL, LDLCALC No results found for: HGBA1C No results found for: LABOPIA, COCAINSCRNUR, LABBENZ, AMPHETMU, THCU, LABBARB  Recent Labs  Lab 11/04/20 1837  ETH <10    I have personally reviewed the radiological images below and agree with the radiology interpretations.  DG Chest Port 1 View  Result Date: 11/05/2020 CLINICAL DATA:  45 year old male with open heart surgery. EXAM: PORTABLE CHEST 1 VIEW COMPARISON:  Chest CT dated 11/04/2020. FINDINGS: Right IJ Swan-Ganz catheter with tip likely close to the bifurcation of the main pulmonary trunk. Endotracheal tube with tip approximately 6.5 cm above the carina. Enteric tube extends below the diaphragm with tip beyond the inferior margin of the image. Inferiorly accessed mediastinal drain. The lungs are clear. There is no pleural effusion. Tiny linear lucency in the right apex may be artifactual or represent minimal right pneumothorax measuring approximately 4 mm in thickness. Close follow-up recommended. The cardiac silhouette is within limits. No acute osseous pathology. IMPRESSION: 1. Status post open heart surgery with support apparatus as above. 2. Possible tiny right apical pneumothorax.  Attention on follow-up imaging recommended. These results were called by telephone at the time of interpretation on 11/05/2020 at 4:03 am to the patient's nurse Jess Barters, who verbally acknowledged these results. Electronically Signed   By: Elgie Collard M.D.   On: 11/05/2020 04:07   ECHO INTRAOPERATIVE TEE  Result Date: 11/05/2020  *INTRAOPERATIVE TRANSESOPHAGEAL REPORT *  Patient Name:   ROWYN SPILDE Date of Exam: 11/04/2020 Medical Rec #:  [id number]     Height: Accession #:    [id number]    Weight:       145.5 lb Date of Birth:  1975/09/26    BSA:          1.63 m Patient Age:    45 years      BP:           163/100 mmHg Patient Gender: M             HR:           70 bpm. Exam Location:  Inpatient Transesophogeal exam was perform intraoperatively during surgical procedure. Patient was closely monitored under general anesthesia during the entirety of examination. Indications:     Aortic dissection Performing Phys: [id number] CLARENCE H OWEN Diagnosing Phys: Lewie Loron MD Complications: No known complications during this procedure. POST-OP IMPRESSIONS - Left Ventricle: The left ventricle is unchanged from pre-bypass. - Right Ventricle: The right ventricle appears unchanged from pre-bypass. - Aorta: there is no dissection present in the aorta A graft was placed in the ascending aorta for repair. - Left Atrial Appendage: The left atrial appendage appears unchanged from pre-bypass. - Aortic Valve: The aortic valve appears unchanged from pre-bypass. - Mitral Valve: The mitral valve appears unchanged from pre-bypass. - Tricuspid Valve: The tricuspid valve appears unchanged from pre-bypass. PRE-OP FINDINGS  Left Ventricle: The left ventricle has normal systolic function, with an ejection fraction of 55-60%. The cavity size was normal. There is severe concentric left ventricular hypertrophy. Right Ventricle: The right ventricle has normal systolic function. The cavity  was normal. There is no increase in right ventricular  wall thickness. Left Atrium: Left atrial size was normal in size. No left atrial/left atrial appendage thrombus was detected. Right Atrium: Right atrial size was normal in size. Interatrial Septum: The interatrial septum was not assessed. There is no evidence of a patent foramen ovale. Pericardium: Trivial pericardial effusion is present. Mitral Valve: The mitral valve is normal in structure. Mitral valve regurgitation is trivial by color flow Doppler. Tricuspid Valve: The tricuspid valve was normal in structure. Tricuspid valve regurgitation is trivial by color flow Doppler. Aortic Valve: The aortic valve is tricuspid Aortic valve regurgitation is trivial by color flow Doppler. There is no stenosis of the aortic valve. Pulmonic Valve: The pulmonic valve was normal in structure. Pulmonic valve regurgitation is not visualized by color flow Doppler. Aorta: There is evidence of a dissection in the aortic root, ascending aorta and aortic arch. Shunts: There is no evidence of an atrial septal defect.  Lewie LoronJohn Germeroth MD Electronically signed by Lewie LoronJohn Germeroth MD Signature Date/Time: 11/05/2020/2:44:37 AM    Final    CT Angio Chest/Abd/Pel for Dissection W and/or W/WO  Result Date: 11/04/2020 CLINICAL DATA:  Further evaluation of aortic dissection seen on prior same day imaging EXAM: CT ANGIOGRAPHY CHEST, ABDOMEN AND PELVIS TECHNIQUE: Non-contrast CT of the chest was initially obtained. Multidetector CT imaging through the chest, abdomen and pelvis was performed using the standard protocol during bolus administration of intravenous contrast. Multiplanar reconstructed images and MIPs were obtained and reviewed to evaluate the vascular anatomy. CONTRAST:  100mL OMNIPAQUE IOHEXOL 350 MG/ML SOLN COMPARISON:  Same day CT neck. FINDINGS: CTA CHEST FINDINGS Cardiovascular: Ascending aortic dissection extending from superior to the sino-tubular junction to the transverse arch with continuation of the dissection into the  innominate artery with thrombus in the false lumen of the innominate artery. No involvement of the descending thoracic aorta. The left main and right coronary arteries not appear to be involved and are patent as are the portions of the LAD and left circumflex. Normal size heart. No significant pericardial effusion/thickening. No central pulmonary embolus. Mediastinum/Nodes: No mediastinal fluid collection. No discrete thyroid nodule. No pathologically enlarged mediastinal, hilar or axillary lymph nodes. The trachea and esophagus are grossly unremarkable. Lungs/Pleura: No suspicious pulmonary nodules or masses. No focal consolidation. No pleural effusion. No pneumothorax. Musculoskeletal: No acute osseous abnormality. Review of the MIP images confirms the above findings. CTA ABDOMEN AND PELVIS FINDINGS VASCULAR Aorta: Aortic atherosclerosis. Normal caliber abdominal aorta without aneurysm, dissection, vasculitis or significant stenosis. Celiac: Patent without evidence of aneurysm, dissection, vasculitis or significant stenosis. SMA: Patent without evidence of aneurysm, dissection, vasculitis or significant stenosis. Renals: Both renal arteries are patent without evidence of aneurysm, dissection, vasculitis, fibromuscular dysplasia or significant stenosis. IMA: Patent without evidence of aneurysm, dissection, vasculitis or significant stenosis. Inflow: Patent without evidence of aneurysm, dissection, vasculitis or significant stenosis. Veins: No obvious venous abnormality within the limitations of this arterial phase study. Review of the MIP images confirms the above findings. NON-VASCULAR Hepatobiliary: No suspicious hepatic lesion. Gallbladder is unremarkable. No biliary ductal dilation. Pancreas: Within normal limits. Spleen: Within normal limits. Adrenals/Urinary Tract: Adrenal glands are unremarkable. Kidneys are normal, without renal calculi, solid enhancing lesion, or hydronephrosis. Symmetric renal  enhancement. Bladder is unremarkable. Stomach/Bowel: Stomach is within normal limits. Appendix appears normal. No evidence of bowel wall thickening, distention, or inflammatory changes. Lymphatic: No pathologically enlarged abdominal or pelvic lymph nodes. Reproductive: Prostate is unremarkable. Other: No abdominopelvic ascites.  No  pneumoperitoneum. Musculoskeletal: Mild multilevel degenerative change of the spine. No acute osseous abnormality Review of the MIP images confirms the above findings. IMPRESSION: 1. 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 No involvement of the descending thoracic aorta. No pericardial effusion or mediastinal fluid. 2. No acute findings within the abdomen or pelvis. 3.  Aortic Atherosclerosis (ICD10-I70.0). Electronically Signed   By: Maudry Mayhew MD   On: 11/04/2020 20:22   CT HEAD CODE STROKE WO CONTRAST  Result Date: 11/04/2020 CLINICAL DATA:  Code stroke.  Aphasia EXAM: CT HEAD WITHOUT CONTRAST TECHNIQUE: Contiguous axial images were obtained from the base of the skull through the vertex without intravenous contrast. COMPARISON:  None. FINDINGS: Brain: There is no acute intracranial hemorrhage or mass effect. Question small subtle loss of gray-white differentiation in the right insular region. Ventricles and sulci are normal in size and configuration. Vascular: Question of hyperdensity along distal right M2 or proximal M3 MCA branch in the posterior sylvian fissure. Skull: Unremarkable. Sinuses/Orbits: No acute abnormality. Other: Mastoid air cells are clear. ASPECTS [facility] Stroke Program Early CT Score) - Ganglionic level infarction (caudate, lentiform nuclei, internal capsule, insula, M1-M3 cortex): 6 - Supraganglionic infarction (M4-M6 cortex): 3 Total score (0-10 with 10  being normal): 9 IMPRESSION: No acute intracranial hemorrhage. Question subtle small loss of gray-white differentiation in the right insular region (ASPECT score is 9). Possible hyperdense distal right M2 or proximal M3 MCA branch in the posterior sylvian fissure. These results were communicated to Dr. Iver Nestle at 6:45 pm on 11/04/2020 by text page via the [facility] messaging system. Electronically Signed   By: Guadlupe Spanish M.D.   On: 11/04/2020 18:59   CT ANGIO HEAD NECK W WO CM (CODE STROKE)  Result Date: 11/04/2020 CLINICAL DATA:  Code stroke EXAM: CT ANGIOGRAPHY HEAD AND NECK TECHNIQUE: Multidetector CT imaging of the head and neck was performed using the standard protocol during bolus administration of intravenous contrast. Multiplanar CT image reconstructions and MIPs were obtained to evaluate the vascular anatomy. Carotid stenosis measurements (when applicable) are obtained utilizing NASCET criteria, using the distal internal carotid diameter as the denominator. CONTRAST:  59mL OMNIPAQUE IOHEXOL 350 MG/ML SOLN COMPARISON:  None. FINDINGS: CTA NECK Aortic arch: A dissection flap is present within the included ascending aorta extending into the transverse portion of the arch but not the descending portion. Dissection involves the innominate artery with thrombosis of the false lumen. This continues into the right common carotid. Left common carotid and left subclavian artery origins are patent. Right carotid system: As noted above, there is extension of dissection into the common carotid with occlusion of the false lumen resulting in narrowing of the true lumen. Minimal diameter of 1.5 mm. The external carotid origin is patent. There is minimal contrast enhancement at the ICA origin and subsequent minimal enhancement along portions of the cervical ICA. Left carotid system: Patent. Trace calcified plaque along the proximal ICA. No stenosis. Vertebral arteries: Patent.  Right vertebral artery is dominant. Skeleton:  Degenerative changes of the cervical spine. Other neck: Unremarkable. Upper chest: Included upper lungs are clear. Review of the MIP images confirms the above findings CTA HEAD Anterior circulation: Possible faint enhancement of the intracranial right internal carotid artery. There is reconstitution at the terminus. Right M1 and proximal M2 MCA are patent. There is occlusion of  a small right distal M2 and proximal M3 MCA branch corresponding to abnormality on noncontrast CT. Left middle and both anterior cerebral arteries are patent. Anterior communicating artery is present. Posterior circulation: Intracranial vertebral arteries are patent. The left vertebral artery becomes small in caliber after PICA origin. Basilar artery is patent. Major cerebellar artery origins are patent. Posterior cerebral arteries are patent there is a left posterior communicating artery. Venous sinuses: Patent as allowed by contrast bolus timing. 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 This extends into the right common carotid with narrowing of the true lumen to a minimal diameter of 1.5 mm. Minimal enhancement within the right cervical and intracranial ICA. Reconstitution at the right ICA terminus. No proximal intracranial vessel occlusion. 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 These results were called by telephone at the time of interpretation on 11/04/2020 at 6:59 pm to provider [facility] , who verbally acknowledged these results. Electronically Signed   By: Guadlupe Spanish M.D.   On: 11/04/2020 19:10     PHYSICAL EXAM  Temp:  [96.44 F (35.8 C)-99.5 F (37.5 C)] 98.42 F (36.9 C) (07/13 1130) Pulse Rate:  [51-110] 56 (07/13 1530) Resp:   [9-28] 20 (07/13 1530) BP: (89-163)/(70-100) 106/71 (07/13 1530) SpO2:  [93 %-100 %] 94 % (07/13 1530) Arterial Line BP: (107-183)/(62-138) 143/138 (07/13 1315) FiO2 (%):  [40 %-50 %] 40 % (07/13 1054) Weight:  [66 kg] 66 kg (07/12 1916)  General - Well nourished, well developed, restless and mild agitation  Ophthalmologic - fundi not visualized due to noncooperation.  Cardiovascular - Regular rhythm and rate.  Neuro - awake, with eyes open, however, nonverbal except saying "awsome", not following simple commands. Right gaze preference and able to cross midline, not blinking to visual threat bilaterally, PERRL. No facial droop. Tongue protrusion not cooperative. Moving BLEs strong, RUE strong without restrain, LUE seems 3+-4/5 strength without restrain. Sensation, coordination not cooperative and gait not tested.   ASSESSMENT/PLAN Mr. Sione Baumgarten is a 45 y.o. male with history of anxiety, now seeing doctor for 22 years admitted for confusion, headache, blurry vision and aphasia. No tPA given due to outside window and aortic dissection.    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 2D Echo EF 55 to 60% LDL pending HgbA1c pending SCDs for VTE prophylaxis No antithrombotic prior to admission, now ASA discontinued. Recommend ASA if OK with primary team.  Ongoing aggressive stroke risk factor management Therapy recommendations: Pending Disposition: Pending  Aortic dissection CTA head and neck showed type A aortic dissection, not involving descending aorta CTVS on board Status post  repair  Dysphagia Currently n.p.o. Speech on board  Acute blood loss anemia Hb 7.1-7.5-8.5-9.5-10.2 Status post PRBC during procedure Close monitoring Recommend aspirin if okay with primary team  Other Stroke Risk Factors   Other Active Problems Hyponatremia Na 133-135-138  Hospital day # 0  This patient  is critically ill due to aortic dissection status post surgical repair, stroke, anemia and at significant risk of neurological worsening, death form recurrent stroke, hemorrhagic conversion, heart failure, severe anemia. This patient's care requires constant monitoring of vital signs, hemodynamics, respiratory and cardiac monitoring, review of multiple databases, neurological assessment, discussion with family, other specialists and medical decision making of high complexity. I spent 40 minutes of neurocritical care time in the care of this patient. I had long discussion with father at bedside, updated pt current condition, treatment plan and potential prognosis, and answered all the questions.  He expressed understanding and appreciation.      Marvel Plan, MD PhD Stroke Neurology 11/05/2020 3:33 PM    To contact Stroke Continuity provider, please refer to [email or web address]. After hours, contact General Neurology

## 2020-11-05 NOTE — Transfer of Care (Signed)
Immediate Anesthesia Transfer of Care Note  Patient: Alessio Bogan  Procedure(s) Performed: 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 WITH REPLACEMENT OF ASCENDING AORTA AND HEMIARCH USING HEMASHIELD PLATINUM GRAFT AND HEMASHIELD GOLD 14 X GRAFT, RESUSPENSION OF NATIVE VALVE, AORTA TO RIGHT CAROTID BYPASS, AORTA TO RIGHT SUBCLAVIAN BYPASS (Chest) TRANSESOPHAGEAL ECHOCARDIOGRAM (TEE)  Patient Location: ICU  Anesthesia Type:General  Level of Consciousness: sedated and Patient remains intubated per anesthesia plan  Airway & Oxygen Therapy: Patient remains intubated per anesthesia plan and Patient placed on Ventilator (see vital sign flow sheet for setting)  Post-op Assessment: Report given to RN and Post -op Vital signs reviewed and stable  Post vital signs: Reviewed and stable  Last Vitals:  Vitals Value Taken Time  BP    Temp    Pulse    Resp    SpO2      Last Pain:  Vitals:   11/04/20 1912  PainSc: 0-No pain         Complications: No notable events documented.

## 2020-11-05 NOTE — Progress Notes (Signed)
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  Otherwise looks remarkably good  Objective: Vital signs: BP Readings from Last 1 Encounters:  11/05/20 108/83   Pulse Readings from Last 1 Encounters:  11/05/20 (!) 59   Resp Readings from Last 1 Encounters:  11/05/20 (!) 9   Temp Readings from Last 1 Encounters:  11/05/20 99.32 F (37.4 C)    Hemodynamics: PAP: (19-28)/(10-17) 24/16 CO:  [3.7 L/min] 3.7 L/min CI:  [2 L/min/m2] 2 L/min/m2  Physical Exam:  Rhythm:   sinus  Breath sounds: clear  Heart sounds:  RRR  Incisions:  Dressings dry, intact  Abdomen:  Soft, non-distended, non-tender  Extremities:  Warm, well-perfused  Neuro:   Grossly non-focal   Intake/Output from previous day: 07/12 0701 - 07/13 0700 In: 6503.8 [I.V.:3918.2; Blood:2096; IV Piggyback:489.6] Out: 3510 [Urine:3250; Chest Tube:260] Intake/Output this shift: Total I/O In: 884 [I.V.:648.4; IV Piggyback:235.6] Out: 1245 [Urine:1125; Chest Tube:120]  Lab Results:  CBC: Recent Labs    11/05/20 0326 11/05/20 0920  WBC 8.5 6.8  HGB 9.5* 9.6*  HCT 27.5* 27.9*  PLT 99* 107*    BMET:  Recent Labs    11/05/20 0326 11/05/20 0920  NA 136 136  K 3.9 4.0  CL 104 106  CO2 25 24  GLUCOSE 123* 134*  BUN 14 13  CREATININE 1.15 1.18  CALCIUM 8.4* 8.1*     PT/INR:   Recent Labs    11/05/20 0326  LABPROT 16.5*  INR 1.3*    CBG (last 3)  Recent Labs    11/05/20 0758  11/05/20 0919 11/05/20 1014  GLUCAP 143* 135* 138*    ABG    Component Value Date/Time   PHART 7.252 (L) 11/05/2020 0205   PCO2ART 56.4 (H) 11/05/2020 0205   PO2ART 384 (H) 11/05/2020 0205   HCO3 24.9 11/05/2020 0205   TCO2 24 11/05/2020 0208   ACIDBASEDEF 2.0 11/05/2020 0205   O2SAT 100.0 11/05/2020 0205    CXR: PORTABLE CHEST 1 VIEW   COMPARISON:  Chest CT dated 11/04/2020.   FINDINGS: Right IJ Swan-Ganz catheter with tip likely close to the bifurcation of the main pulmonary trunk. Endotracheal tube with tip approximately 6.5 cm above the carina. Enteric tube extends below the diaphragm with tip beyond the inferior margin of the image. Inferiorly accessed mediastinal drain.   The lungs are clear. There is no pleural effusion. Tiny linear lucency in the right apex may be artifactual or represent minimal right pneumothorax measuring approximately 4 mm in thickness. Close follow-up recommended.   The cardiac silhouette is within limits. No acute osseous pathology.   IMPRESSION: 1. Status post open heart surgery with support apparatus as above. 2. Possible tiny right apical pneumothorax. Attention on follow-up imaging recommended.   These results were called by telephone at the time of interpretation on 11/05/2020 at 4:03 am to the patient's nurse Jess Barters, who verbally acknowledged these results.  Electronically Signed   By: Elgie Collard M.D.   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 WITH REPLACEMENT OF ASCENDING AORTA AND HEMIARCH USING HEMASHIELD PLATINUM GRAFT AND HEMASHIELD GOLD 14 X GRAFT, RESUSPENSION OF NATIVE VALVE, AORTA TO RIGHT CAROTID BYPASS, AORTA TO RIGHT SUBCLAVIAN BYPASS (N/A) TRANSESOPHAGEAL ECHOCARDIOGRAM (TEE) (N/A)  Overall doing well s/p repair acute aortic dissection Neuro exam non-focal, encouraging Maintaining  NSR w/ stable hemodynamics on low dose Cleviprex for hypertension O2 sats 100% on 40% FiO2 and CXR looks good Chest tube output low Expected post op acute blood loss anemia, Hgb 9.6 initial post op Expected post op volume excess Post op thrombocytopenia, platelet count 107k initial postop  Wean vent and extubate Monitor neuro exam Mobilize as much as possible after extubation D/C lines    Purcell Nails, MD 11/05/2020 11:16 AM

## 2020-11-05 NOTE — Procedures (Addendum)
Extubation Procedure Note  Patient Details:   Name: Jesse [name] DOB: [birth year] MRN: [id number]   Airway Documentation:    Vent end date: 11/05/20 Vent end time: 1127   Evaluation  O2 sats: stable throughout Complications: No apparent complications Patient did tolerate procedure well. Bilateral Breath Sounds: Clear, Diminished   Pt extubated to 2L  Shores per rapid wean protocol. NIF and VC were not performed per DR Cornelius Moras order. Pt had positive cuff leak prior to extubation. Pt able to voice his name. No stridor noted.  Guss Bunde 11/05/2020, 11:28 AM

## 2020-11-05 NOTE — Brief Op Note (Signed)
11/04/2020  1:48 AM  PATIENT:  Jesse [name]  45 y.o. 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 Pelance, RNFA   ANESTHESIA:   general  EBL:  Per anesthesia and perfusion records  BLOOD ADMINISTERED: 2 Plt phereses, 2 units FFP, 1 unit Cryoprecipitate  DRAINS:  Mediastinal drains    LOCAL MEDICATIONS USED:  NONE  SPECIMEN:  Dissected segment of ascending aorta  DISPOSITION OF SPECIMEN:  PATHOLOGY  COUNTS:  YES  DICTATION: .Dragon Dictation  PLAN OF CARE: Admit to inpatient   PATIENT DISPOSITION:  ICU - intubated and hemodynamically stable.   Delay start of Pharmacological VTE agent (>24hrs) due to surgical blood loss or risk of bleeding: yes

## 2020-11-05 NOTE — Op Note (Addendum)
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  Patient has history of anxiety and depression with longstanding tobacco and alcohol use but no other significant chronic medical problems, although the patient has not seen a physician for routine checkup in many years.  He was reportedly in his usual state of health until approximately 12 noon today when he was out mowing the lawn and he  developed sudden onset chest pain radiating to his neck.  The pain seemed to subside but over the next several hours it became apparent the patient had developed severe acute aphasia as well as blurry vision in his right eye.  The patient's wife found him laying in the attic confused and called EMS.  The patient was noted to have severe receptive and expressive aphasia.  Code stroke was activated and [facility] initial CT of the head revealed no acute intracranial hemorrhage and only subtle findings but CT angiogram revealed acute dissection involving the aortic arch extending through the innominate artery into the right common carotid artery.  Emergent cardiothoracic surgical consultation was requested.  The patient has been seen in consultation and counseled regarding the indications, risks and potential benefits of surgery.  All questions have been answered, and the patient and his wife provide full informed consent for the operation as described.    DETAILS OF THE OPERATIVE PROCEDURE  Preparation:  The patient is brought to the operating room on the above mentioned date and central monitoring was established by the anesthesia team including placement of Swan-Ganz catheter and radial arterial line. The patient is placed in the supine position on the operating table.  Intravenous antibiotics are administered. General endotracheal anesthesia is induced uneventfully. A Foley catheter is placed.  Cerebral oximetry is monitored.  At baseline cerebral oximetry saturations appear symmetrical and consistently near 70%.  Baseline transesophageal echocardiogram was performed.  Findings were notable for an obvious dissection flap in the ascending aorta with the appearance of thrombosis of the false lumen.  The dissected portion of the aorta was dilated.  The aortic valve was trileaflet and the aortic root size appeared normal.  The  dissection  flap extended down just below the sinotubular junction into the noncoronary sinus of Valsalva.  There was only mild central aortic insufficiency.  Left ventricular size appeared normal although there was moderate left ventricular hypertrophy.  There was normal left ventricular systolic function.  No other significant abnormalities were noted.  The patient's chest, abdomen, both groins, and both lower extremities are prepared and draped in a sterile manner. A time out procedure is performed.   Percutaneous Vascular Access:  Percutaneous arterial and venous access were obtained on the right side.  Using ultrasound guidance the right common femoral vein was cannulated using the Seldinger technique and a pair of Perclose vascular closure devises were placed at opposing 30 degree angles, after which time an 8 French sheath inserted.  The right common femoral artery was cannulated using a micropuncture wire and sheath.  A pair of Perclose vascular closure devices were placed at opposing 30 degree angles in the femoral artery, and a 8 French sheath inserted.     Surgical Approach:  A median sternotomy incision was performed and the pericardium opened.  There was no blood in the pericardium although there was a small amount of serosanguineous fluid.  The ascending aorta was obviously dissected throughout its entirety.  The surgical incision was extended into the lower portion of the neck.  Dissection is continued around the innominate vein and the innominate artery is identified.  The innominate artery is completely dissected.  Dissection is continued further in a cephalad direction until the bifurcation of the innominate artery is identified and dissected from adjacent structures.  The surface of the heart is normal in appearance.   Extracorporeal Cardiopulmonary Bypass and Myocardial Protection:  The patient was heparinized systemically.  The right common femoral vein is cannulated through the  venous sheath and a guidewire advanced into the right atrium using TEE guidance.  The femoral vein cannulated using a 22 Fr long femoral venous cannula.  The right common femoral artery is cannulated through the arterial sheath and a guidewire advanced into the descending thoracic aorta using TEE guidance.  Femoral artery is cannulated with a 18 French femoral arterial cannula.   Adequate heparinization is verified.   A retrograde cardioplegia cannula is placed through the right atrium into the coronary sinus.  The operative field was continuously flooded with carbon dioxide gas.  The entire pre-bypass portion of the operation was notable for stable hemodynamics.  Cardiopulmonary bypass was begun and the surface of the heart is inspected.  A left ventricular vent is placed through the right superior pulmonary vein.  A temperature probe was placed in the interventricular septum.  An umbilical tapes near its placed around the superior vena cava and a second retrograde catheter is placed just above it in the superior vena cava with the distal tip of the catheter extended up the superior vena cava.  The patient is cooled to 26C systemic temperature.  The aortic cross clamp is applied across the mid portion of the dissected ascending aorta and cardioplegia is delivered initially in a retrograde fashion through the coronary sinus catheter using modified [place] cold blood cardioplegia (Kennestone blood cardioplegia protocol).   The initial cardioplegic arrest is rapid with early diastolic arrest.  Repeat doses of cardioplegia are administered at 90 minutes and every 30 minutes thereafter through the coronary sinus catheter in order to maintain completely flat electrocardiogram.  Myocardial protection was felt to be excellent.   Repair of Ascending Thoracic Aortic Dissection:  The ascending aorta is transected  proximally.  There is an obvious dissection with complete thrombosis of the false lumen.  Dissection  is continued proximally and the proximal aorta is removed to the level of the sinotubular junction.  At this level the dissection flap extends down into the noncoronary sinus of Valsalva.  The aortic valve is normal in appearance.  The aortic valve is resuspended using horizontal mattress 4-0 Prolene sutures placed just above the commissure between the left and noncoronary sinus of Valsalva and again just above the commissure between the right and noncoronary sinus of Valsalva.  Prior to securing the sutures all clot is removed from the dissected portion of the noncoronary sinus of Valsalva.  After resuspension of the valve the valve was tested with saline and appears intact.  At the level of the sinotubular junction the aorta measures 26 mm in diameter.  A Hemashield platinum woven double velour vascular graft (size 26 mm) is chosen to replace the ascending aorta.  The proximal suture line is performed using interrupted 2-0 Ethibond horizontal mattress pledgeted sutures followed by a second layer of running 4-0 Prolene suture.  At this juncture the patient's core body temperature is reached 26C.  The patient is placed in steep Trendelenburg position and administered high-dose Solu-Medrol and etomidate.  The patient's head is packed in ice.  The aortic cross-clamp is removed after total circulatory arrest is initiated and the patient's blood volume is exsanguinated into the pump.  The aortic cross-clamp was removed.  The umbilical tape snare is secured around the superior vena cava and cold blood retrograde cerebral perfusion is performed through the superior vena cava cannula.  The distal ascending and transverse aorta is carefully examined.  There is an obvious injury dissection flap noted at the takeoff of the innominate artery.  The distal extent of the dissection in the transverse aortic arch extends along the inferior wall throughout the arch but stops at the level of the left subclavian.  The  dissection flap extends up the innominate artery with thrombosis of the false lumen.  The transverse aortic arch is subsequently transected just proximal to the left common carotid artery and the origin of the innominate artery is amputated from the aorta.  Dissection is now continued up the innominate artery and it becomes clear that thrombosis of the false lumen extends throughout the entire innominate artery involves the proximal right common carotid artery.  The entire innominate artery is subsequently resected to just beyond the bifurcation.  The origin of the right subclavian artery appears essentially normal.  The dissection flap extends up the proximal portion of the right common carotid artery a short distance and is nearly occlusive because of thrombosis of the false lumen.  The proximal portion of the right common carotid artery is resected until adequate lumen is identified.  A portion of the 26 mm Hemashield vascular graft is utilized to replace the distal ascending aorta and proximal transverse aortic arch.  The distal suture line is performed using running 3-0 Prolene suture with Teflon felt strips to buttress the suture line.  After the distal anastomosis has been completed all air is evacuated from the aortic arch and distal aorta by slowly resuming femoral arterial flow.  A clamp was placed across the 26 mm Hemashield graft and full flow cardiopulmonary bypass is resumed after total circulatory arrest time of 24 minutes.  During the total circulatory arrest portion of the operation the patient's cerebral oximetry is saturations remained essentially symmetrical although both dropped to between 50 and  55%.  Cerebral oximetry saturations quickly returned to greater than 70% after resumption of total cardiopulmonary bypass.  The distal anastomosis is inspected for hemostasis.  The proximal end of the right common carotid artery and right subclavian artery are each trimmed to good tissue.  Aorta to  right common carotid artery bypass and aorto to right subclavian artery bypass is performed using a 14 x 8 x 8 bifurcated Hemashield gold vascular graft.  The first 8 mm limb of the bifurcated graft is sewn in an end-to-end fashion to the proximal right common carotid artery with running 5-0 Prolene suture.  The second 8 mm limb is sewn to the proximal right subclavian artery.  The proximal and distal segments of the 26 mm Hemashield graft utilized to replace the ascending aorta are beveled and trimmed to an appropriate length and sewn together with running 4-0 Prolene suture.  The 14 x 8 x 8 mm bifurcated vascular graft was then sewn to the side of the ascending aortic graft after trimming and beveling it to an appropriate length.   Procedure Completion:  A small hole in the anterior portion of the ascending aortic graft is created to serve as a vent.  One final dose of warm retrograde "reanimation dose" cardioplegia was administered retrograde through the coronary sinus catheter while all air was evacuated through the aortic root.  The aortic cross clamp was removed after a total cross clamp time of 134 minutes.  All surgical anastomoses are inspected for meticulous hemostasis.  Several additional pledgeted sutures are utilized around the distal anastomosis of the transverse aortic arch graft.  Once adequate surgical hemostasis is ascertained ventilation is begun.  Patient resumes normal sinus rhythm spontaneously.  Epicardial pacing wires are fixed to the right ventricular outflow tract and to the right atrial appendage. The patient is rewarmed to 37C temperature. The aortic and left ventricular vents are removed.  The patient is weaned and disconnected from cardiopulmonary bypass.  The patient's rhythm at separation from bypass was sinus.  The patient was weaned from cardiopulmonary bypass without any inotropic support. Total cardiopulmonary bypass time for the operation was 206 minutes.  Followup  transesophageal echocardiogram performed after separation from bypass revealed a normal-appearing aortic valve with trivial central aortic insufficiency.  Left ventricular function was normal, unchanged from preoperatively.  Protamine was administered to reverse the anticoagulation. The mediastinum and pleural space were inspected for hemostasis and irrigated with saline solution.  There was significant coagulopathy.  The patient received a total of 2 packs adult platelets, 2 units fresh frozen plasma, and one pack cryoprecipitate due to coagulopathy and thrombocytopenia after separation from cardiopulmonary bypass and reversal of heparin with protamine.  The mediastinum was drained using 2 chest tubes placed through separate stab incisions inferiorly.  The soft tissues anterior to the aorta were reapproximated loosely. The sternum is closed with Fibertape cerclage. The soft tissues anterior to the sternum were closed in multiple layers and the skin is closed with a running subcuticular skin closure.  The post-bypass portion of the operation was notable for stable rhythm and hemodynamics.    Disposition:  The patient tolerated the procedure well and is transported to the surgical intensive care in stable condition. There are no intraoperative complications. All sponge instrument and needle counts are verified correct at completion of the operation.    Salvatore Decent. Cornelius Moras MD 11/05/2020 3:23 AM

## 2020-11-05 NOTE — Progress Notes (Addendum)
15 beat non-sustained VT. Pt asymptomatic. MD paged. Awaiting return call. VSS.  1246 Received return call from MD. Made aware of VT run. No new orders at this time.

## 2020-11-05 NOTE — Progress Notes (Signed)
TCTS PN  POD #1 s/p repair Type A dissection Hemodyn stable On precedex, moving spontaneously BP 102/71   Pulse (!) 59   Temp (!) 96.7 F (35.9 C) (Axillary)   Resp 12   Ht 6\' 2"  (1.88 m)   Wt 66 kg   SpO2 100%   BMI 18.68 kg/m  CTA RRR (paced)  Good UOP Little CT output   Intake/Output Summary (Last 24 hours) at 11/05/2020 1713 Last data filed at 11/05/2020 1600 Gross per 24 hour  Intake 7902.19 ml  Output 6095 ml  Net 1807.19 ml   A/p: continue present care Jesse [name] Z. 11/07/2020, MD [phone]

## 2020-11-06 ENCOUNTER — Inpatient Hospital Stay (HOSPITAL_COMMUNITY): Payer: Medicaid Other

## 2020-11-06 ENCOUNTER — Other Ambulatory Visit: Payer: Self-pay

## 2020-11-06 DIAGNOSIS — Z9889 Other specified postprocedural states: Secondary | ICD-10-CM

## 2020-11-06 DIAGNOSIS — J9601 Acute respiratory failure with hypoxia: Secondary | ICD-10-CM

## 2020-11-06 DIAGNOSIS — I6330 Cerebral infarction due to thrombosis of unspecified cerebral artery: Secondary | ICD-10-CM

## 2020-11-06 DIAGNOSIS — I7101 Dissection of thoracic aorta: Secondary | ICD-10-CM

## 2020-11-06 LAB — LIPID PANEL
Cholesterol: 112 mg/dL (ref 0–200)
HDL: 24 mg/dL — ABNORMAL LOW (ref >40)
LDL Cholesterol: 49 mg/dL (ref 0–99)
Total CHOL/HDL Ratio: 4.7 RATIO
Triglycerides: 196 mg/dL — ABNORMAL HIGH (ref <150)
VLDL: 39 mg/dL (ref 0–40)

## 2020-11-06 LAB — POCT I-STAT 7, (LYTES, BLD GAS, ICA,H+H)
Acid-Base Excess: 6 mmol/L — ABNORMAL HIGH (ref 0.0–2.0)
Bicarbonate: 31 mmol/L — ABNORMAL HIGH (ref 20.0–28.0)
Calcium, Ion: 1.13 mmol/L — ABNORMAL LOW (ref 1.15–1.40)
HCT: 26 % — ABNORMAL LOW (ref 39.0–52.0)
Hemoglobin: 8.8 g/dL — ABNORMAL LOW (ref 13.0–17.0)
O2 Saturation: 97 %
Patient temperature: 99.2
Potassium: 3.3 mmol/L — ABNORMAL LOW (ref 3.5–5.1)
Sodium: 137 mmol/L (ref 135–145)
TCO2: 32 mmol/L (ref 22–32)
pCO2 arterial: 44.6 mmHg (ref 32.0–48.0)
pH, Arterial: 7.452 — ABNORMAL HIGH (ref 7.350–7.450)
pO2, Arterial: 88 mmHg (ref 83.0–108.0)

## 2020-11-06 LAB — PREPARE FRESH FROZEN PLASMA
Unit division: 0
Unit division: 0

## 2020-11-06 LAB — BPAM PLATELET PHERESIS
Blood Product Expiration Date: 202207132359
Blood Product Expiration Date: 202207152359
ISSUE DATE / TIME: 202207130121
ISSUE DATE / TIME: 202207130121
Unit Type and Rh: 5100
Unit Type and Rh: 6200

## 2020-11-06 LAB — CBC
HCT: 26 % — ABNORMAL LOW (ref 39.0–52.0)
HCT: 29.1 % — ABNORMAL LOW (ref 39.0–52.0)
Hemoglobin: 8.7 g/dL — ABNORMAL LOW (ref 13.0–17.0)
Hemoglobin: 9.7 g/dL — ABNORMAL LOW (ref 13.0–17.0)
MCH: 34.3 pg — ABNORMAL HIGH (ref 26.0–34.0)
MCH: 32.9 pg (ref 26.0–34.0)
MCHC: 33.5 g/dL (ref 30.0–36.0)
MCHC: 33.3 g/dL (ref 30.0–36.0)
MCV: 102.4 fL — ABNORMAL HIGH (ref 80.0–100.0)
MCV: 98.6 fL (ref 80.0–100.0)
Platelets: 90 10*3/uL — ABNORMAL LOW (ref 150–400)
Platelets: 94 10*3/uL — ABNORMAL LOW (ref 150–400)
RBC: 2.54 MIL/uL — ABNORMAL LOW (ref 4.22–5.81)
RBC: 2.95 MIL/uL — ABNORMAL LOW (ref 4.22–5.81)
RDW: 14.5 % (ref 11.5–15.5)
RDW: 13.9 % (ref 11.5–15.5)
WBC: 8.2 10*3/uL (ref 4.0–10.5)
WBC: 7.8 10*3/uL (ref 4.0–10.5)
nRBC: 0 % (ref 0.0–0.2)
nRBC: 0 % (ref 0.0–0.2)

## 2020-11-06 LAB — PREPARE CRYOPRECIPITATE
Unit division: 0
Unit division: 0

## 2020-11-06 LAB — BPAM FFP
Blood Product Expiration Date: 202207162359
Blood Product Expiration Date: 202207162359
ISSUE DATE / TIME: 202207130122
ISSUE DATE / TIME: 202207130122
Unit Type and Rh: 6200
Unit Type and Rh: 6200

## 2020-11-06 LAB — GLUCOSE, CAPILLARY
Glucose-Capillary: 107 mg/dL — ABNORMAL HIGH (ref 70–99)
Glucose-Capillary: 109 mg/dL — ABNORMAL HIGH (ref 70–99)
Glucose-Capillary: 113 mg/dL — ABNORMAL HIGH (ref 70–99)
Glucose-Capillary: 118 mg/dL — ABNORMAL HIGH (ref 70–99)
Glucose-Capillary: 120 mg/dL — ABNORMAL HIGH (ref 70–99)
Glucose-Capillary: 98 mg/dL (ref 70–99)

## 2020-11-06 LAB — POTASSIUM: Potassium: 4 mmol/L (ref 3.5–5.1)

## 2020-11-06 LAB — PREPARE PLATELET PHERESIS
Unit division: 0
Unit division: 0

## 2020-11-06 LAB — BPAM CRYOPRECIPITATE
Blood Product Expiration Date: 202207130720
Blood Product Expiration Date: 202207130720
ISSUE DATE / TIME: 202207130137
ISSUE DATE / TIME: 202207130148
Unit Type and Rh: 5100
Unit Type and Rh: 5100

## 2020-11-06 LAB — SURGICAL PATHOLOGY

## 2020-11-06 LAB — BASIC METABOLIC PANEL
Anion gap: 4 — ABNORMAL LOW (ref 5–15)
Anion gap: 10 (ref 5–15)
BUN: 11 mg/dL (ref 6–20)
BUN: 15 mg/dL (ref 6–20)
CO2: 26 mmol/L (ref 22–32)
CO2: 28 mmol/L (ref 22–32)
Calcium: 6.9 mg/dL — ABNORMAL LOW (ref 8.9–10.3)
Calcium: 8 mg/dL — ABNORMAL LOW (ref 8.9–10.3)
Chloride: 106 mmol/L (ref 98–111)
Chloride: 96 mmol/L — ABNORMAL LOW (ref 98–111)
Creatinine, Ser: 1.11 mg/dL (ref 0.61–1.24)
Creatinine, Ser: 1.03 mg/dL (ref 0.61–1.24)
GFR, Estimated: >60 mL/min (ref >60)
GFR, Estimated: >60 mL/min (ref >60)
Glucose, Bld: 116 mg/dL — ABNORMAL HIGH (ref 70–99)
Glucose, Bld: 125 mg/dL — ABNORMAL HIGH (ref 70–99)
Potassium: >7.5 mmol/L (ref 3.5–5.1)
Potassium: 3.4 mmol/L — ABNORMAL LOW (ref 3.5–5.1)
Sodium: 136 mmol/L (ref 135–145)
Sodium: 134 mmol/L — ABNORMAL LOW (ref 135–145)

## 2020-11-06 LAB — HEMOGLOBIN A1C
Hgb A1c MFr Bld: 5.2 % (ref 4.8–5.6)
Mean Plasma Glucose: 102.54 mg/dL

## 2020-11-06 LAB — MAGNESIUM
Magnesium: 1.8 mg/dL (ref 1.7–2.4)
Magnesium: 2.1 mg/dL (ref 1.7–2.4)

## 2020-11-06 LAB — PHOSPHORUS: Phosphorus: 3.5 mg/dL (ref 2.5–4.6)

## 2020-11-06 MED ORDER — ORAL CARE MOUTH RINSE
15.0000 mL | Freq: Two times a day (BID) | OROMUCOSAL | Status: DC
  Administered 2020-11-06 (×2): 15 mL via OROMUCOSAL

## 2020-11-06 MED ORDER — MIDAZOLAM HCL 2 MG/2ML IJ SOLN
INTRAMUSCULAR | Status: AC
  Filled 2020-11-06: qty 2

## 2020-11-06 MED ORDER — FENTANYL CITRATE (PF) 100 MCG/2ML IJ SOLN
INTRAMUSCULAR | Status: AC
  Filled 2020-11-06: qty 2

## 2020-11-06 MED ORDER — ETOMIDATE 2 MG/ML IV SOLN
INTRAVENOUS | Status: AC
  Filled 2020-11-06: qty 20

## 2020-11-06 MED ORDER — FUROSEMIDE 10 MG/ML IJ SOLN
20.0000 mg | Freq: Two times a day (BID) | INTRAMUSCULAR | Status: AC
  Administered 2020-11-06 – 2020-11-07 (×4): 20 mg via INTRAVENOUS
  Filled 2020-11-06 (×5): qty 2

## 2020-11-06 MED ORDER — FENTANYL 2500MCG IN NS 250ML (10MCG/ML) PREMIX INFUSION
50.0000 ug/h | INTRAVENOUS | Status: DC
  Administered 2020-11-06: 50 ug/h via INTRAVENOUS
  Administered 2020-11-07 (×2): 150 ug/h via INTRAVENOUS
  Filled 2020-11-06 (×3): qty 250

## 2020-11-06 MED ORDER — ROCURONIUM BROMIDE 10 MG/ML (PF) SYRINGE
PREFILLED_SYRINGE | INTRAVENOUS | Status: AC
  Filled 2020-11-06: qty 10

## 2020-11-06 MED ORDER — FENTANYL CITRATE (PF) 100 MCG/2ML IJ SOLN
50.0000 ug | Freq: Once | INTRAMUSCULAR | Status: AC
  Filled 2020-11-06: qty 2

## 2020-11-06 MED ORDER — DOCUSATE SODIUM 50 MG/5ML PO LIQD
100.0000 mg | Freq: Two times a day (BID) | ORAL | Status: DC
  Administered 2020-11-06 – 2020-11-08 (×4): 100 mg
  Filled 2020-11-06 (×4): qty 10

## 2020-11-06 MED ORDER — CHLORHEXIDINE GLUCONATE 0.12 % MT SOLN
15.0000 mL | Freq: Two times a day (BID) | OROMUCOSAL | Status: DC
  Administered 2020-11-06 – 2020-11-13 (×12): 15 mL via OROMUCOSAL
  Filled 2020-11-06 (×9): qty 15

## 2020-11-06 MED ORDER — FENTANYL BOLUS VIA INFUSION
50.0000 ug | INTRAVENOUS | Status: DC | PRN
  Filled 2020-11-06: qty 100

## 2020-11-06 MED ORDER — CHLORHEXIDINE GLUCONATE 0.12% ORAL RINSE (MEDLINE KIT)
15.0000 mL | Freq: Two times a day (BID) | OROMUCOSAL | Status: DC
  Administered 2020-11-06 – 2020-11-08 (×3): 15 mL via OROMUCOSAL

## 2020-11-06 MED ORDER — HYDRALAZINE HCL 20 MG/ML IJ SOLN
10.0000 mg | Freq: Four times a day (QID) | INTRAMUSCULAR | Status: DC | PRN
  Administered 2020-11-06 – 2020-11-12 (×8): 10 mg via INTRAVENOUS
  Filled 2020-11-06 (×8): qty 1

## 2020-11-06 MED ORDER — ASPIRIN 300 MG RE SUPP
300.0000 mg | Freq: Every day | RECTAL | Status: DC
  Administered 2020-11-06: 300 mg via RECTAL
  Filled 2020-11-06 (×2): qty 1

## 2020-11-06 MED ORDER — ORAL CARE MOUTH RINSE
15.0000 mL | OROMUCOSAL | Status: DC
  Administered 2020-11-06 – 2020-11-08 (×17): 15 mL via OROMUCOSAL

## 2020-11-06 MED ORDER — POLYETHYLENE GLYCOL 3350 17 G PO PACK
17.0000 g | PACK | Freq: Every day | ORAL | Status: DC
  Administered 2020-11-06 – 2020-11-08 (×2): 17 g
  Filled 2020-11-06 (×2): qty 1

## 2020-11-06 MED ORDER — FUROSEMIDE 10 MG/ML IJ SOLN
40.0000 mg | Freq: Once | INTRAMUSCULAR | Status: AC
  Administered 2020-11-06: 40 mg via INTRAVENOUS
  Filled 2020-11-06: qty 4

## 2020-11-06 MED FILL — Sodium Bicarbonate IV Soln 8.4%: INTRAVENOUS | Qty: 50 | Status: AC

## 2020-11-06 MED FILL — Calcium Chloride Inj 10%: INTRAVENOUS | Qty: 10 | Status: AC

## 2020-11-06 MED FILL — Mannitol IV Soln 20%: INTRAVENOUS | Qty: 500 | Status: AC

## 2020-11-06 MED FILL — Albumin, Human Inj 5%: INTRAVENOUS | Qty: 250 | Status: AC

## 2020-11-06 MED FILL — Sodium Chloride IV Soln 0.9%: INTRAVENOUS | Qty: 2000 | Status: AC

## 2020-11-06 MED FILL — Potassium Chloride Inj 2 mEq/ML: INTRAVENOUS | Qty: 40 | Status: AC

## 2020-11-06 MED FILL — Lidocaine HCl Local Preservative Free (PF) Inj 2%: INTRAMUSCULAR | Qty: 15 | Status: AC

## 2020-11-06 MED FILL — Heparin Sodium (Porcine) Inj 1000 Unit/ML: INTRAMUSCULAR | Qty: 20 | Status: AC

## 2020-11-06 MED FILL — Heparin Sodium (Porcine) Inj 1000 Unit/ML: INTRAMUSCULAR | Qty: 30 | Status: AC

## 2020-11-06 MED FILL — Electrolyte-R (PH 7.4) Solution: INTRAVENOUS | Qty: 5000 | Status: AC

## 2020-11-06 NOTE — Consult Note (Addendum)
NAME:  Jesse [name], MRN:  [id number], DOB:  January 07, 1976, LOS: 1 ADMISSION DATE:  11/04/2020, CONSULTATION DATE:  7/14 REFERRING MD:  Dr. Cornelius Moras, CHIEF COMPLAINT:  hypoxia   History of Present Illness:  Patient is encephalopathic and/or intubated. Therefore history has been obtained from chart review.   45 year old male with PMH as below, whish is significant for anxiety, depression, and tobacco abuse. Developed chest pain and headache while doing yard work 7/12. Then developed blurry vision and aphasia. He was transported to [facility] as a code stroke. 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 Cardiothoracic surgery was consulted and took the patient emergently to the operating room. He underwent replacement of ascending aorta and proximal transverse aortic arch, Aorta to right common carotid bypass, and aorta to right subclavian bypass. Postoperatively he was transferred to the CVICU. 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 He was extubated 7/13. He was awake at that time, but disoriented and not following commands. Agitated at times. 7/14 agitation worsened and he became hypoxemic requiring heated high flow and NRB simultaneously. Unable to sustain sats over 85%. PCCM called emergently to bedside by bedside RN.   Pertinent  Medical History   has a past medical history of Anxiety, Depression, Essential hypertension, and S/P aortic dissection repair (11/05/2020).  Significant Hospital Events: Including procedures, antibiotic start and stop dates in addition to other pertinent events   7/12 admit for aortic dissection, emergently for repair.   Interim History / Subjective:    Objective   Blood pressure (!) 145/87, pulse 87, temperature 99.2 F (37.3 C), temperature source Oral, resp. rate (!) 34, height 6\' 2"  (1.88 m), weight 68.9 kg, SpO2 (!) 75 %.         Intake/Output Summary (Last 24 hours) at 11/06/2020 1929 Last data filed at 11/06/2020 1800 Gross per 24 hour  Intake 1135.88 ml  Output 3520 ml  Net -2384.12 ml   Filed Weights   11/04/20 1800 11/04/20 1916 11/06/20 0500  Weight: 66 kg 66 kg 68.9 kg    Examination: General: Thin middle aged male HENT: [place]/AT, PERRL, no JVD Lungs: Coarse, diminished bases.  Cardiovascular: RRR, no MRG Abdomen: Soft, non-tender, non-distended.  Extremities: No acute deformity or ROM limitation. No edema.  Neuro: Awake, agitated, flailing extremities. Incomprehensible speech.  GU: Foley  Resolved Hospital Problem list     Assessment & Plan:   Acute hypoxemic respiratory failure secondary to aspiration vs pulmonary edema - Emergent intubation - CXR - ABG - VAP bundle - Diuresis vs ABX vs both depending on CXR.   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 If remains hypertensive once adequately sedated we can restart clevidipine.   ABLA: surgical. Improving - Follow CBC  Wife Jina updated.   Best Practice (right click and "Reselect all SmartList Selections" daily)   Diet/type: NPO DVT prophylaxis: not indicated GI prophylaxis: PPI Lines: N/A Foley:  Yes, and it is still needed Code Status:  full code Last date of multidisciplinary goals of care discussion [ ]   Labs   CBC: Recent Labs  Lab 11/04/20 1837 11/04/20 1840 11/05/20 0027 11/05/20 0034 11/05/20 0326 11/05/20  0345 11/05/20 0920 11/05/20 1110 11/05/20 1208 11/05/20 1305 11/06/20 0510  WBC 12.7*  --   --   --  8.5  --  6.8  --  9.6  --  8.2  NEUTROABS 10.8*  --   --   --   --   --   --   --   --   --   --   HGB 14.5   < > 8.9*   < > 9.5*   < >  9.6* 9.5* 9.8* 10.2* 8.7*  HCT 42.4   < > 25.9*   < > 27.5*   < > 27.9* 28.0* 29.4* 30.0* 26.0*  MCV 97.2  --   --   --  98.6  --  98.2  --  99.3  --  102.4*  PLT 170  --  97*  --  99*  --  107*  --  121*  --  90*   < > = values in this interval not displayed.    Basic Metabolic Panel: Recent Labs  Lab 11/04/20 1837 11/04/20 1840 11/05/20 0208 11/05/20 0326 11/05/20 0345 11/05/20 0920 11/05/20 1110 11/05/20 1208 11/05/20 1305 11/06/20 0510 11/06/20 0615  NA 133*   < > 134* 136   < > 136 138 135 138 136  --   K 4.5   < > 4.6 3.9   < > 4.0 3.9 3.6 3.8 >7.5* 4.0  CL 100   < > 98 104  --  106  --  103  --  106  --   CO2 25  --   --  25  --  24  --  24  --  26  --   GLUCOSE 112*   < > 200* 123*  --  134*  --  147*  --  116*  --   BUN 19   < > 16 14  --  13  --  13  --  11  --   CREATININE 1.32*   < > 0.90 1.15  --  1.18  --  1.18  --  1.11  --   CALCIUM 9.1  --   --  8.4*  --  8.1*  --  7.7*  --  6.9*  --   MG  --   --   --  2.4  --  3.0*  --  2.5*  --  1.8  --    < > = values in this interval not displayed.   GFR: Estimated Creatinine Clearance: 81 mL/min (by C-G formula based on SCr of 1.11 mg/dL). Recent Labs  Lab 11/05/20 0326 11/05/20 0920 11/05/20 1208 11/06/20 0510  WBC 8.5 6.8 9.6 8.2    Liver Function Tests: Recent Labs  Lab 11/04/20 1837  AST 20  ALT 15  ALKPHOS 55  BILITOT 0.7  PROT 6.3*  ALBUMIN 3.7   No results for input(s): LIPASE, AMYLASE in the last 168 hours. No results for input(s): AMMONIA in the last 168 hours.  ABG    Component Value Date/Time   PHART 7.424 11/05/2020 1305   PCO2ART 36.0 11/05/2020 1305   PO2ART 69 (L) 11/05/2020 1305   HCO3 23.8 11/05/2020 1305   TCO2 25 11/05/2020 1305   ACIDBASEDEF 1.0 11/05/2020 1305   O2SAT 95.0 11/05/2020 1305     Coagulation Profile: Recent Labs  Lab 11/04/20 1837 11/05/20 0326  INR 1.0 1.3*    Cardiac Enzymes: No results for input(s): CKTOTAL, CKMB, CKMBINDEX, TROPONINI in  the last  168 hours.  HbA1C: Hgb A1c MFr Bld  Date/Time Value Ref Range Status  11/06/2020 05:10 AM 5.2 4.8 - 5.6 % Final    Comment:    (NOTE) Pre diabetes:          5.7%-6.4%  Diabetes:              >6.4%  Glycemic control for   <7.0% adults with diabetes     CBG: Recent Labs  Lab 11/05/20 2343 11/06/20 0343 11/06/20 0818 11/06/20 1153 11/06/20 1634  GLUCAP 123* 120* 109* 98 107*    Review of Systems:   Patient is encephalopathic and/or intubated. Therefore history has been obtained from chart review.    Past Medical History:  He,  has a past medical history of Anxiety, Depression, Essential hypertension, and S/P aortic dissection repair (11/05/2020).   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 WITH REPLACEMENT OF ASCENDING AORTA AND HEMIARCH USING HEMASHIELD PLATINUM GRAFT AND HEMASHIELD GOLD 14 X GRAFT, RESUSPENSION OF NATIVE VALVE, AORTA TO RIGHT CAROTID BYPASS, AORTA TO RIGHT SUBCLAVIAN BYPASS;  Surgeon: Purcell Nails, MD;  Location: [facility] OR;  Service: Open Heart Surgery;  Laterality: N/A;   TEE WITHOUT CARDIOVERSION N/A 11/04/2020   Procedure: TRANSESOPHAGEAL ECHOCARDIOGRAM (TEE);  Surgeon: Purcell Nails, MD;  Location: [facility] OR;  Service: Open Heart Surgery;  Laterality: N/A;     Social History:   reports that he has been smoking cigarettes. He does not have any smokeless tobacco history on file. He reports current alcohol use. He reports previous drug use.   Family History:  His family history is not on file.   Allergies No Known Allergies   Home Medications  Prior to Admission medications   Medication Sig Start Date End Date Taking? Authorizing Provider  aspirin-acetaminophen-caffeine (EXCEDRIN MIGRAINE) [phone] MG tablet Take 2 tablets by mouth every 6 (six) hours as needed for headache.   Yes [provider]  diphenhydrAMINE (BENADRYL) 25 MG tablet Take 50-75 mg by mouth every 6 (six) hours as needed for sleep.   Yes [provider]  Melatonin 10 MG TABS Take 10 mg by mouth at bedtime as needed (sleep).   Yes [provider]  Potassium 95 MG TABS Take 95 mg by mouth daily as needed (cramps).   Yes [provider]     Critical care time: 46 minutes.     Joneen Roach, AGACNP-BC [facility]  See Amion for personal pager PCCM on call pager [phone] until 7pm. Please call Elink 7p-7a. [phone]  11/06/2020 7:53 PM

## 2020-11-06 NOTE — Progress Notes (Signed)
EVENING ROUNDS NOTE :     [address]       [place] [id number]             [phone]                 2 Days Post-Op Procedure(s) (LRB): 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 WITH REPLACEMENT OF ASCENDING AORTA AND HEMIARCH USING HEMASHIELD PLATINUM GRAFT AND HEMASHIELD GOLD 14 X GRAFT, RESUSPENSION OF NATIVE VALVE, AORTA TO RIGHT CAROTID BYPASS, AORTA TO RIGHT SUBCLAVIAN BYPASS (N/A) TRANSESOPHAGEAL ECHOCARDIOGRAM (TEE) (N/A)   Total Length of Stay:  LOS: 1 day  Events:   Agitated over the course of the day. Intubated by pulmonary critical care for combativeness.    BP 113/65 (BP Location: Left Arm)   Pulse 69   Temp 99.2 F (37.3 C) (Oral)   Resp 15   Ht 6\' 2"  (1.88 m)   Wt 68.9 kg   SpO2 99%   BMI 19.50 kg/m      Vent Mode: PRVC FiO2 (%):  [100 %] 100 % Set Rate:  [15 bmp] 15 bmp Vt Set:  [660 mL] 660 mL PEEP:  [5 cmH20] 5 cmH20 Plateau Pressure:  [24 cmH20] 24 cmH20   sodium chloride      ceFAZolin (ANCEF) IV Stopped (11/06/20 1345)   dexmedetomidine (PRECEDEX) IV infusion 0.8 mcg/kg/hr (11/06/20 2138)   fentaNYL infusion INTRAVENOUS 50 mcg/hr (11/06/20 1950)   lactated ringers     lactated ringers 20 mL/hr at 11/06/20 1800    I/O last 3 completed shifts: In: 2769.6 [I.V.:1923.3; IV Piggyback:846.3] Out: 6425 [Urine:5475; Chest Tube:950]   CBC Latest Ref Rng & Units 11/06/2020 11/06/2020 11/06/2020  WBC 4.0 - 10.5 K/uL - 7.8 8.2  Hemoglobin 13.0 - 17.0 g/dL 11/08/2020) 7.7(A) 1.2(I)  Hematocrit 39.0 - 52.0 % 26.0(L) 29.1(L) 26.0(L)  Platelets 150 - 400 K/uL - 94(L) 90(L)    BMP Latest Ref Rng & Units 11/06/2020 11/06/2020 11/06/2020  Glucose 70 - 99 mg/dL - - 11/08/2020)  BUN 6 - 20 mg/dL - - 11  Creatinine 767(M - 1.24 mg/dL - - 0.94  Sodium 7.09 - 145 mmol/L 137 - 136  Potassium 3.5 - 5.1 mmol/L 3.3(L) 4.0 >7.5(HH)  Chloride 98 - 111 mmol/L - - 106  CO2 22 - 32 mmol/L - - 26  Calcium 8.9 - 10.3 mg/dL - - 6.9(L)    ABG     Component Value Date/Time   PHART 7.452 (H) 11/06/2020 2029   PCO2ART 44.6 11/06/2020 2029   PO2ART 88 11/06/2020 2029   HCO3 31.0 (H) 11/06/2020 2029   TCO2 32 11/06/2020 2029   ACIDBASEDEF 1.0 11/05/2020 1305   O2SAT 97.0 11/06/2020 2029       2030, MD 11/06/2020 9:48 PM

## 2020-11-06 NOTE — Progress Notes (Signed)
STROKE TEAM PROGRESS NOTE   SUBJECTIVE (INTERVAL HISTORY) His sitter at the bedside.  Per sitter, pt was intermittently agitated and trying to get out of bed. Currently on b/l wrist restrain. On precedex. CT head in am showed large right posterior MCA infarct. No hemorrhagic infarction. ASA started. Did not pass swallow, currently NPO.    OBJECTIVE Temp:  [96.7 F (35.9 C)-99.8 F (37.7 C)] 98.4 F (36.9 C) (07/14 1150) Pulse Rate:  [56-77] 75 (07/14 1400) Cardiac Rhythm: Normal sinus rhythm (07/14 1200) Resp:  [11-26] 26 (07/14 1400) BP: (90-146)/(67-92) 139/76 (07/14 1400) SpO2:  [86 %-100 %] 100 % (07/14 1400) Weight:  [68.9 kg] 68.9 kg (07/14 0500)  Recent Labs  Lab 11/05/20 1955 11/05/20 2343 11/06/20 0343 11/06/20 0818 11/06/20 1153  GLUCAP 125* 123* 120* 109* 98   Recent Labs  Lab 11/04/20 1837 11/04/20 1840 11/05/20 0208 11/05/20 0326 11/05/20 0345 11/05/20 0920 11/05/20 1110 11/05/20 1208 11/05/20 1305 11/06/20 0510 11/06/20 0615  NA 133*   < > 134* 136   < > 136 138 135 138 136  --   K 4.5   < > 4.6 3.9   < > 4.0 3.9 3.6 3.8 >7.5* 4.0  CL 100   < > 98 104  --  106  --  103  --  106  --   CO2 25  --   --  25  --  24  --  24  --  26  --   GLUCOSE 112*   < > 200* 123*  --  134*  --  147*  --  116*  --   BUN 19   < > 16 14  --  13  --  13  --  11  --   CREATININE 1.32*   < > 0.90 1.15  --  1.18  --  1.18  --  1.11  --   CALCIUM 9.1  --   --  8.4*  --  8.1*  --  7.7*  --  6.9*  --   MG  --   --   --  2.4  --  3.0*  --  2.5*  --  1.8  --    < > = values in this interval not displayed.   Recent Labs  Lab 11/04/20 1837  AST 20  ALT 15  ALKPHOS 55  BILITOT 0.7  PROT 6.3*  ALBUMIN 3.7   Recent Labs  Lab 11/04/20 1837 11/04/20 1840 11/05/20 0027 11/05/20 0034 11/05/20 0326 11/05/20 0345 11/05/20 0920 11/05/20 1110 11/05/20 1208 11/05/20 1305 11/06/20 0510  WBC 12.7*  --   --   --  8.5  --  6.8  --  9.6  --  8.2  NEUTROABS 10.8*  --   --   --    --   --   --   --   --   --   --   HGB 14.5   < > 8.9*   < > 9.5*   < > 9.6* 9.5* 9.8* 10.2* 8.7*  HCT 42.4   < > 25.9*   < > 27.5*   < > 27.9* 28.0* 29.4* 30.0* 26.0*  MCV 97.2  --   --   --  98.6  --  98.2  --  99.3  --  102.4*  PLT 170  --  97*  --  99*  --  107*  --  121*  --  90*   < > =  values in this interval not displayed.   No results for input(s): CKTOTAL, CKMB, CKMBINDEX, TROPONINI in the last 168 hours. Recent Labs    11/04/20 1837 11/05/20 0326  LABPROT 12.9 16.5*  INR 1.0 1.3*   No results for input(s): COLORURINE, LABSPEC, PHURINE, GLUCOSEU, HGBUR, BILIRUBINUR, KETONESUR, PROTEINUR, UROBILINOGEN, NITRITE, LEUKOCYTESUR in the last 72 hours.  Invalid input(s): APPERANCEUR     Component Value Date/Time   CHOL 112 11/06/2020 0510   TRIG 196 (H) 11/06/2020 0510   HDL 24 (L) 11/06/2020 0510   CHOLHDL 4.7 11/06/2020 0510   VLDL 39 11/06/2020 0510   LDLCALC 49 11/06/2020 0510   Lab Results  Component Value Date   HGBA1C 5.2 11/06/2020   No results found for: LABOPIA, COCAINSCRNUR, LABBENZ, AMPHETMU, THCU, LABBARB  Recent Labs  Lab 11/04/20 1837  ETH <10    I have personally reviewed the radiological images below and agree with the radiology interpretations.  CT HEAD WO CONTRAST  Result Date: 11/06/2020 CLINICAL DATA:  45 year old male code stroke presentation. Type a aortic dissection. Right carotid involvement and occlusion, distal right M2 occlusion. Subsequent encounter. EXAM: CT HEAD WITHOUT CONTRAST TECHNIQUE: Contiguous axial images were obtained from the base of the skull through the vertex without intravenous contrast. COMPARISON:  CTA head and neck, and CT headi 11/04/2020 FINDINGS: Brain: Confluent cytotoxic edema throughout a roughly 8 cm area of the posterior right hemisphere involving the posterior right MCA and right MCA/PCA watershed area. See sagittal image 15. Mild regional mass effect. Mild mass effect on the right lateral ventricle and trace  leftward midline shift (series 3, image 23). No hemorrhagic transformation. Basilar cisterns remain patent. No ventriculomegaly. Deep gray nuclei appear stable. Left hemisphere and posterior fossa gray-white matter differentiation is stable. Vascular: No large vessel hyperdensity. Skull: Negative. Sinuses/Orbits: Visualized paranasal sinuses and mastoids are stable and well aerated. Other: Visualized orbits and scalp soft tissues are within normal limits. IMPRESSION: 1. Relatively large infarct with cytotoxic edema in the posterior Right MCA and MCA/PCA watershed territories. Mild mass effect including trace leftward midline shift. No hemorrhagic transformation. 2. Preserved gray-white matter differentiation elsewhere. Electronically Signed   By: Odessa Fleming M.D.   On: 11/06/2020 06:33   DG Chest Port 1 View  Addendum Date: 11/06/2020   ADDENDUM REPORT: 11/06/2020 09:13 ADDENDUM: Also with slight increased retrocardiac opacification and RIGHT basilar opacification since prior study. These findings and findings in the initial report were discussed with the provider as outlined below. These results were called by telephone at the time of interpretation on 11/06/2020 at 9:13 am to provider Tressie Stalker , who verbally acknowledged these results. Electronically Signed   By: Donzetta Kohut M.D.   On: 11/06/2020 09:13   Result Date: 11/06/2020 CLINICAL DATA:  Post aortic dissection repair in a 45 year old male. EXAM: PORTABLE CHEST 1 VIEW COMPARISON:  November 05, 2020. FINDINGS: EKG leads project over the chest. Since the previous study the patient has been extubated. Chest support tubes project over the heart terminating just below the clavicular heads with respect to the more rightward oriented tube, not changed in position. The LEFT-sided tube terminates over the LEFT chest in the retrocardiac region with similar appearance. RIGHT IJ sheath in similar position Swan-Ganz catheter now removed. Persistent tiny RIGHT apical  pneumothorax, unchanged. Trachea midline. Cardiomediastinal contours and hilar structures are stable. Graded opacity at the RIGHT lung base is subtle with obscured LEFT hemidiaphragm along the medial aspect when compared to the prior study. Possible  deep sulcus sign on the LEFT otherwise no evidence of LEFT-sided pneumothorax. On limited assessment no acute skeletal process. IMPRESSION: 1. Persistent tiny RIGHT apical pneumothorax. 2. Possible deep sulcus sign on the LEFT potentially a very small LEFT pneumothorax. Attention on follow-up. 3. Interval extubation, removal of gastric tube and removal of Swan-Ganz catheter, otherwise stable position of support apparatus. A call is out to the referring provider to further discuss findings in the above case. Electronically Signed: By: Donzetta KohutGeoffrey  Wile M.D. On: 11/06/2020 09:05   DG Chest Port 1 View  Result Date: 11/05/2020 CLINICAL DATA:  45 year old male with open heart surgery. EXAM: PORTABLE CHEST 1 VIEW COMPARISON:  Chest CT dated 11/04/2020. FINDINGS: Right IJ Swan-Ganz catheter with tip likely close to the bifurcation of the main pulmonary trunk. Endotracheal tube with tip approximately 6.5 cm above the carina. Enteric tube extends below the diaphragm with tip beyond the inferior margin of the image. Inferiorly accessed mediastinal drain. The lungs are clear. There is no pleural effusion. Tiny linear lucency in the right apex may be artifactual or represent minimal right pneumothorax measuring approximately 4 mm in thickness. Close follow-up recommended. The cardiac silhouette is within limits. No acute osseous pathology. IMPRESSION: 1. Status post open heart surgery with support apparatus as above. 2. Possible tiny right apical pneumothorax. Attention on follow-up imaging recommended. These results were called by telephone at the time of interpretation on 11/05/2020 at 4:03 am to the patient's nurse Jess BartersKock, who verbally acknowledged these results. Electronically  Signed   By: Elgie CollardArash  Radparvar M.D.   On: 11/05/2020 04:07   ECHO INTRAOPERATIVE TEE  Result Date: 11/05/2020  *INTRAOPERATIVE TRANSESOPHAGEAL REPORT *  Patient Name:   Ephriam JenkinsBRADLEY Ramroop Date of Exam: 11/04/2020 Medical Rec #:  [id number]     Height: Accession #:    [id number]    Weight:       145.5 lb Date of Birth:  1976-01-07    BSA:          1.63 m Patient Age:    44 years      BP:           163/100 mmHg Patient Gender: M             HR:           70 bpm. Exam Location:  Inpatient Transesophogeal exam was perform intraoperatively during surgical procedure. Patient was closely monitored under general anesthesia during the entirety of examination. Indications:     Aortic dissection Performing Phys: [id number] CLARENCE H OWEN Diagnosing Phys: Lewie LoronJohn Germeroth MD Complications: No known complications during this procedure. POST-OP IMPRESSIONS - Left Ventricle: The left ventricle is unchanged from pre-bypass. - Right Ventricle: The right ventricle appears unchanged from pre-bypass. - Aorta: there is no dissection present in the aorta A graft was placed in the ascending aorta for repair. - Left Atrial Appendage: The left atrial appendage appears unchanged from pre-bypass. - Aortic Valve: The aortic valve appears unchanged from pre-bypass. - Mitral Valve: The mitral valve appears unchanged from pre-bypass. - Tricuspid Valve: The tricuspid valve appears unchanged from pre-bypass. PRE-OP FINDINGS  Left Ventricle: The left ventricle has normal systolic function, with an ejection fraction of 55-60%. The cavity size was normal. There is severe concentric left ventricular hypertrophy. Right Ventricle: The right ventricle has normal systolic function. The cavity was normal. There is no increase in right ventricular wall thickness. Left Atrium: Left atrial size was normal in size. No left atrial/left atrial appendage thrombus was detected. Right  Atrium: Right atrial size was normal in size. Interatrial Septum: The interatrial septum was  not assessed. There is no evidence of a patent foramen ovale. Pericardium: Trivial pericardial effusion is present. Mitral Valve: The mitral valve is normal in structure. Mitral valve regurgitation is trivial by color flow Doppler. Tricuspid Valve: The tricuspid valve was normal in structure. Tricuspid valve regurgitation is trivial by color flow Doppler. Aortic Valve: The aortic valve is tricuspid Aortic valve regurgitation is trivial by color flow Doppler. There is no stenosis of the aortic valve. Pulmonic Valve: The pulmonic valve was normal in structure. Pulmonic valve regurgitation is not visualized by color flow Doppler. Aorta: There is evidence of a dissection in the aortic root, ascending aorta and aortic arch. Shunts: There is no evidence of an atrial septal defect.  Lewie Loron MD Electronically signed by Lewie Loron MD Signature Date/Time: 11/05/2020/2:44:37 AM    Final    CT Angio Chest/Abd/Pel for Dissection W and/or W/WO  Result Date: 11/04/2020 CLINICAL DATA:  Further evaluation of aortic dissection seen on prior same day imaging EXAM: CT ANGIOGRAPHY CHEST, ABDOMEN AND PELVIS TECHNIQUE: Non-contrast CT of the chest was initially obtained. Multidetector CT imaging through the chest, abdomen and pelvis was performed using the standard protocol during bolus administration of intravenous contrast. Multiplanar reconstructed images and MIPs were obtained and reviewed to evaluate the vascular anatomy. CONTRAST:  OMNIPAQUE IOHEXOL 350 MG/ML SOLN COMPARISON:  Same day CT neck. FINDINGS: CTA CHEST FINDINGS Cardiovascular: Ascending aortic dissection extending from superior to the sino-tubular junction to the transverse arch with continuation of the dissection into the innominate artery with thrombus in the false lumen of the innominate artery. No involvement of the descending thoracic aorta. The left main and right coronary arteries not appear to be involved and are patent as are the portions of  the LAD and left circumflex. Normal size heart. No significant pericardial effusion/thickening. No central pulmonary embolus. Mediastinum/Nodes: No mediastinal fluid collection. No discrete thyroid nodule. No pathologically enlarged mediastinal, hilar or axillary lymph nodes. The trachea and esophagus are grossly unremarkable. Lungs/Pleura: No suspicious pulmonary nodules or masses. No focal consolidation. No pleural effusion. No pneumothorax. Musculoskeletal: No acute osseous abnormality. Review of the MIP images confirms the above findings. CTA ABDOMEN AND PELVIS FINDINGS VASCULAR Aorta: Aortic atherosclerosis. Normal caliber abdominal aorta without aneurysm, dissection, vasculitis or significant stenosis. Celiac: Patent without evidence of aneurysm, dissection, vasculitis or significant stenosis. SMA: Patent without evidence of aneurysm, dissection, vasculitis or significant stenosis. Renals: Both renal arteries are patent without evidence of aneurysm, dissection, vasculitis, fibromuscular dysplasia or significant stenosis. IMA: Patent without evidence of aneurysm, dissection, vasculitis or significant stenosis. Inflow: Patent without evidence of aneurysm, dissection, vasculitis or significant stenosis. Veins: No obvious venous abnormality within the limitations of this arterial phase study. Review of the MIP images confirms the above findings. NON-VASCULAR Hepatobiliary: No suspicious hepatic lesion. Gallbladder is unremarkable. No biliary ductal dilation. Pancreas: Within normal limits. Spleen: Within normal limits. Adrenals/Urinary Tract: Adrenal glands are unremarkable. Kidneys are normal, without renal calculi, solid enhancing lesion, or hydronephrosis. Symmetric renal enhancement. Bladder is unremarkable. Stomach/Bowel: Stomach is within normal limits. Appendix appears normal. No evidence of bowel wall thickening, distention, or inflammatory changes. Lymphatic: No pathologically enlarged abdominal or pelvic  lymph nodes. Reproductive: Prostate is unremarkable. Other: No abdominopelvic ascites.  No pneumoperitoneum. Musculoskeletal: Mild multilevel degenerative change of the spine. No acute osseous abnormality Review of the MIP images confirms the above findings. IMPRESSION: 1. 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 No involvement of the descending thoracic aorta. No pericardial effusion or mediastinal fluid. 2. No acute findings within the abdomen or pelvis. 3.  Aortic Atherosclerosis (ICD10-I70.0). Electronically Signed   By: Maudry Mayhew MD   On: 11/04/2020 20:22   CT HEAD CODE STROKE WO CONTRAST  Result Date: 11/04/2020 CLINICAL DATA:  Code stroke.  Aphasia EXAM: CT HEAD WITHOUT CONTRAST TECHNIQUE: Contiguous axial images were obtained from the base of the skull through the vertex without intravenous contrast. COMPARISON:  None. FINDINGS: Brain: There is no acute intracranial hemorrhage or mass effect. Question small subtle loss of gray-white differentiation in the right insular region. Ventricles and sulci are normal in size and configuration. Vascular: Question of hyperdensity along distal right M2 or proximal M3 MCA branch in the posterior sylvian fissure. Skull: Unremarkable. Sinuses/Orbits: No acute abnormality. Other: Mastoid air cells are clear. ASPECTS [facility] Stroke Program Early CT Score) - Ganglionic level infarction (caudate, lentiform nuclei, internal capsule, insula, M1-M3 cortex): 6 - Supraganglionic infarction (M4-M6 cortex): 3 Total score (0-10 with 10 being normal): 9 IMPRESSION: No acute intracranial hemorrhage. Question subtle small loss of gray-white differentiation in the right insular region (ASPECT score is 9). Possible hyperdense distal right M2 or proximal M3 MCA branch in the  posterior sylvian fissure. These results were communicated to Dr. Iver Nestle at 6:45 pm on 11/04/2020 by text page via the [facility] messaging system. Electronically Signed   By: Guadlupe Spanish M.D.   On: 11/04/2020 18:59   CT ANGIO HEAD NECK W WO CM (CODE STROKE)  Result Date: 11/04/2020 CLINICAL DATA:  Code stroke EXAM: CT ANGIOGRAPHY HEAD AND NECK TECHNIQUE: Multidetector CT imaging of the head and neck was performed using the standard protocol during bolus administration of intravenous contrast. Multiplanar CT image reconstructions and MIPs were obtained to evaluate the vascular anatomy. Carotid stenosis measurements (when applicable) are obtained utilizing NASCET criteria, using the distal internal carotid diameter as the denominator. CONTRAST:  75mL OMNIPAQUE IOHEXOL 350 MG/ML SOLN COMPARISON:  None. FINDINGS: CTA NECK Aortic arch: A dissection flap is present within the included ascending aorta extending into the transverse portion of the arch but not the descending portion. Dissection involves the innominate artery with thrombosis of the false lumen. This continues into the right common carotid. Left common carotid and left subclavian artery origins are patent. Right carotid system: As noted above, there is extension of dissection into the common carotid with occlusion of the false lumen resulting in narrowing of the true lumen. Minimal diameter of 1.5 mm. The external carotid origin is patent. There is minimal contrast enhancement at the ICA origin and subsequent minimal enhancement along portions of the cervical ICA. Left carotid system: Patent. Trace calcified plaque along the proximal ICA. No stenosis. Vertebral arteries: Patent.  Right vertebral artery is dominant. Skeleton: Degenerative changes of the cervical spine. Other neck: Unremarkable. Upper chest: Included upper lungs are clear. Review of the MIP images confirms the above findings CTA HEAD Anterior circulation: Possible faint enhancement of the  intracranial right internal carotid artery. There is reconstitution at the terminus. Right M1 and proximal M2 MCA are patent. There is occlusion of a small right distal M2 and proximal M3 MCA branch corresponding to abnormality on noncontrast CT. Left middle and both anterior cerebral arteries are patent. Anterior communicating artery is  present. Posterior circulation: Intracranial vertebral arteries are patent. The left vertebral artery becomes small in caliber after PICA origin. Basilar artery is patent. Major cerebellar artery origins are patent. Posterior cerebral arteries are patent there is a left posterior communicating artery. Venous sinuses: Patent as allowed by contrast bolus timing. 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 This extends into the right common carotid with narrowing of the true lumen to a minimal diameter of 1.5 mm. Minimal enhancement within the right cervical and intracranial ICA. Reconstitution at the right ICA terminus. No proximal intracranial vessel occlusion. 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 These results were called by telephone at the time of interpretation on 11/04/2020 at 6:59 pm to provider [facility] , who verbally acknowledged these results. Electronically Signed   By: Guadlupe Spanish M.D.   On: 11/04/2020 19:10     PHYSICAL EXAM  Temp:  [96.7 F (35.9 C)-99.8 F (37.7 C)] 98.4 F (36.9 C) (07/14 1150) Pulse Rate:  [56-77] 75 (07/14 1400) Resp:  [11-26] 26 (07/14 1400) BP: (90-146)/(67-92) 139/76 (07/14 1400) SpO2:  [86 %-100 %] 100 % (07/14 1400) Weight:  [68.9 kg] 68.9 kg (07/14 0500)  General - Well nourished, well developed, restless and mild  agitation  Ophthalmologic - fundi not visualized due to noncooperation.  Cardiovascular - Regular rhythm and rate.  Neuro - awake, with eyes open, however, nonverbal except saying "no, f--king no", not following simple commands. Right gaze preference and able to cross midline, not blinking to visual threat on the left but blinking to the right, pupils equal but light reflexes sluggish but positive. No significant facial droop. Tongue protrusion not cooperative. Moving BLEs strong, RUE strong with restrain, LUE seems 3+-4/5 strength with restrain. Seems to have left sided neglect. Sensation, coordination not cooperative and gait not tested.   ASSESSMENT/PLAN Mr. Riki Gehring is a 45 y.o. male with history of anxiety, now seeing doctor for 22 years admitted for confusion, headache, blurry vision and aphasia. No tPA given due to outside window and aortic dissection.    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  Ongoing aggressive stroke risk factor management Therapy recommendations: Pending Disposition: Pending  Aortic dissection CTA head and neck showed type A aortic dissection, not involving descending aorta CTVS on board Status post repair  Dysphagia Currently n.p.o. Speech on board  Acute blood loss anemia Hb 7.1-7.5-8.5-9.5-10.2->8.7 Status post PRBC during procedure Close monitoring On  ASA  Delirium  Multifactorial, likely related to stroke, procedure, anemia Sitter at bedside Soft wrist restrain On precedex   Other Stroke Risk Factors   Other Active Problems Hyponatremia Na [phone]  Hospital day # 1  This patient is critically ill due to aortic dissection s/p  surgery, large right MCA infarct, anemia, dysphagia and at significant risk of neurological worsening, death form heart failure, cardiac arrest, CHF, hemorrhagic conversion, seizure. This patient's care requires constant monitoring of vital signs, hemodynamics, respiratory and cardiac monitoring, review of multiple databases, neurological assessment, discussion with family, other specialists and medical decision making of high complexity. I spent 40 minutes of neurocritical care time in the care of this patient.  Marvel Plan, MD PhD Stroke Neurology 11/06/2020 2:49 PM    To contact Stroke Continuity provider, please refer to [email or web address]. After hours, contact General Neurology

## 2020-11-06 NOTE — TOC Initial Note (Signed)
Transition of Care [facility]) - Initial/Assessment Note    Patient Details  Name: Jesse [name] MRN: [id number] Date of Birth: 1976-01-25  Transition of Care [facility]) CM/SW Contact:    Lockie Pares, RN Phone Number: 11/06/2020, 10:08 AM  Clinical Narrative:                 Admitted for repair of type A ascending aortic dissection, replacement of ascending aortic and hemiarchPOD day 2 . Post neurological changes, CT reveals stroke.  Moving extremities, weaker on one side in restraints and on low dose precedex and cleviprex for BP control. Will likely need IP rehab, patient uninsured. Sent to [facility] for possible mediicaid . No PCP listed will need follow up, suggested [facility] care. CM and CSW will follow for needs and transitions of care.  Expected Discharge Plan: IP Rehab Facility Barriers to Discharge: Inadequate or no insurance   Patient Goals and CMS Choice        Expected Discharge Plan and Services Expected Discharge Plan: IP Rehab Facility In-house Referral: Clinical Social Work Discharge Planning Services: CM Consult   Living arrangements for the past 2 months: Single Family Home                                      Prior Living Arrangements/Services Living arrangements for the past 2 months: Single Family Home Lives with:: Spouse Patient language and need for interpreter reviewed:: Yes        Need for Family Participation in Patient Care: Yes (Comment) Care giver support system in place?: Yes (comment)   Criminal Activity/Legal Involvement Pertinent to Current Situation/Hospitalization: No - Comment as needed  Activities of Daily Living      Permission Sought/Granted                  Emotional Assessment       Orientation: : Fluctuating Orientation (Suspected and/or reported Sundowners) Alcohol / Substance Use: Not Applicable Psych Involvement: No (comment)  Admission diagnosis:  Dissection of thoracic aorta (HCC) [I71.01] 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) [I63.30] Acute thoracic aortic dissection (HCC) [I71.01] Patient Active Problem List   Diagnosis Date Noted   Acute thoracic aortic dissection (HCC) 11/05/2020   S/P aortic dissection repair 11/05/2020   Cerebral embolism with cerebral infarction 11/05/2020   Essential hypertension    PCP:  Pcp, No Pharmacy:   [id number] - [place], [place] - [address] AT [facility] [address] [place] [address] Phone: [phone] Fax: [phone]     Social Determinants of Health (SDOH) Interventions    Readmission Risk Interventions No flowsheet data found.

## 2020-11-06 NOTE — Progress Notes (Signed)
Patient confused extremely restless, not following direction, hypoxic on high flow at 15L, CCM at bedside preparing to intubate.  Pt given versed IV.  CCM intubated and fentanyl gtt started.

## 2020-11-06 NOTE — Progress Notes (Signed)
SLP Cancellation Note  Patient Details Name: Jesse [name] MRN: [id number] DOB: [birth year]   Cancelled treatment:       Reason Eval/Treat Not Completed: Medical issues which prohibited therapy- Pt has HOB restrictions. Will follow along for readiness.  [name], MA CCC/SLP Acute Rehabilitation Services Office number [phone] Pager [phone]    [name] 11/06/2020, 10:55 AM

## 2020-11-06 NOTE — Progress Notes (Addendum)
[address]       [place] [address]             [phone]        CARDIOTHORACIC SURGERY PROGRESS NOTE   R2 Days Post-Op Procedure(s) (LRB): 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 WITH REPLACEMENT OF ASCENDING AORTA AND HEMIARCH USING HEMASHIELD PLATINUM GRAFT AND HEMASHIELD GOLD 14 X GRAFT, RESUSPENSION OF NATIVE VALVE, AORTA TO RIGHT CAROTID BYPASS, AORTA TO RIGHT SUBCLAVIAN BYPASS (N/A) TRANSESOPHAGEAL ECHOCARDIOGRAM (TEE) (N/A)  Subjective: On low-dose Precedex and Cleviprex.  Awakens easily and moves all extremities, attempts to focus on examiner. Would not follow commands for me.  Currently in upper extremity restraints with a sitter at the bedside.   Objective: Vital signs: BP Readings from Last 1 Encounters:  11/06/20 115/68   Pulse Readings from Last 1 Encounters:  11/06/20 63   Resp Readings from Last 1 Encounters:  11/06/20 18   Temp Readings from Last 1 Encounters:  11/06/20 98.3 F (36.8 C) (Axillary)    Hemodynamics: PAP: (23-37)/(13-19) 37/19 CO:  [3.7 L/min] 3.7 L/min CI:  [2 L/min/m2] 2 L/min/m2  Physical Exam:  Heart- RRR, left bundle branch block.  Chest- Breath sounds are clear.  Abd- soft, NT.  Exts- all well perfused. No edema.  Neuro- MAE spontaneously but not to command for me. Left UA seems weaker than right.   Intake/Output from previous day: 07/13 0701 - 07/14 0700 In: 2334.3 [I.V.:1587.9; IV Piggyback:746.3] Out: 4495 [Urine:3775; Chest Tube:720] Intake/Output this shift: No intake/output data recorded.  Lab Results:  CBC: Recent Labs    11/05/20 1208 11/05/20 1305 11/06/20 0510  WBC 9.6  --  8.2  HGB 9.8* 10.2* 8.7*  HCT 29.4* 30.0* 26.0*  PLT 121*  --  90*     BMET:  Recent Labs    11/05/20 1208 11/05/20 1305 11/06/20 0510 11/06/20 0615  NA 135 138 136  --   K 3.6 3.8 >7.5* 4.0  CL 103  --  106  --   CO2 24  --  26  --   GLUCOSE 147*  --  116*  --   BUN 13  --  11   --   CREATININE 1.18  --  1.11  --   CALCIUM 7.7*  --  6.9*  --       PT/INR:   Recent Labs    11/05/20 0326  LABPROT 16.5*  INR 1.3*     CBG (last 3)  Recent Labs    11/05/20 1955 11/05/20 2343 11/06/20 0343  GLUCAP 125* 123* 120*     ABG    Component Value Date/Time   PHART 7.424 11/05/2020 1305   PCO2ART 36.0 11/05/2020 1305   PO2ART 69 (L) 11/05/2020 1305   HCO3 23.8 11/05/2020 1305   TCO2 25 11/05/2020 1305   ACIDBASEDEF 1.0 11/05/2020 1305   O2SAT 95.0 11/05/2020 1305    EXAM: CT HEAD WITHOUT CONTRAST   TECHNIQUE: Contiguous axial images were obtained from the base of the skull through the vertex without intravenous contrast.   COMPARISON:  CTA head and neck, and CT headi 11/04/2020   FINDINGS: Brain: Confluent cytotoxic edema throughout a roughly 8 cm area of the posterior right hemisphere involving the posterior right MCA and right MCA/PCA watershed area. See sagittal image 15. Mild regional mass effect. Mild mass effect on the right lateral ventricle and trace leftward midline shift (series 3, image 23). No hemorrhagic transformation.  Basilar cisterns remain patent. No ventriculomegaly.   Deep gray nuclei appear stable. Left hemisphere and posterior fossa gray-white matter differentiation is stable.   Vascular: No large vessel hyperdensity.   Skull: Negative.   Sinuses/Orbits: Visualized paranasal sinuses and mastoids are stable and well aerated.   Other: Visualized orbits and scalp soft tissues are within normal limits.   IMPRESSION: 1. Relatively large infarct with cytotoxic edema in the posterior Right MCA and MCA/PCA watershed territories. Mild mass effect including trace leftward midline shift. No hemorrhagic transformation. 2. Preserved gray-white matter differentiation elsewhere.     Electronically Signed   By: Odessa Fleming M.D.   On: 11/06/2020 06:33       Assessment/Plan: S/P Procedure(s) (LRB): REPAIR OF TYPE A  ASCENDING AORTIC DISSECTION WITH REPLACEMENT OF ASCENDING AORTA AND HEMIARCH USING HEMASHIELD PLATINUM GRAFT AND HEMASHIELD GOLD 14 X GRAFT, RESUSPENSION OF NATIVE VALVE, AORTA TO RIGHT CAROTID BYPASS, AORTA TO RIGHT SUBCLAVIAN BYPASS (N/A) TRANSESOPHAGEAL ECHOCARDIOGRAM (TEE) (N/A)  -POD2- repair of type A aortic dissection. VS and cardiac rhythm  have been stable. D/C the Cleviprex and use prn medications if needed for BP. Remove pacer wires, leave mediastinal tubes for drainage.  -Right MCA/PCA CVA- appreciate input from Neuro. Continue supportive care, PT/OT/SLP.   OK to proceed with MRI at discretion of neuro team and OK to start ASA.   -Volume excess- gentle diuresis today with IV Lasix.  -Expected acute blood loss anemia-mld and stable. Monitor.    Leary Roca, PA-C [phone] 11/06/2020 7:41 AM    I have seen and examined the patient and agree with the assessment and plan as outlined.  Purcell Nails, MD 11/06/2020 8:43 AM

## 2020-11-06 NOTE — Progress Notes (Signed)
Epicardial pacing wires removed per order/protocol at 1050. Patient educated about procedure and tolerated EPW removal well. VS taken q15 mins x 1 hour and patient maintained on bedrest for 1 hour post-removal. Scant serosanguineous drainage at sites upon removal, sites cleansed with betadine and dressed with gauze and tape. No further drainage from sites noted. EPW intact upon removal.

## 2020-11-06 NOTE — Procedures (Signed)
Intubation Procedure Note  Dieter Hane  [id number]  08-Jul-1975  Date:11/06/20  Time:7:28 PM   Provider Performing:Seong-Joo Ardeth Perfect    Procedure: Intubation (31500)  Indication(s) Respiratory Failure  Consent Unable to obtain consent due to emergent nature of procedure.   Anesthesia Etomidate, Versed, and Rocuronium   Time Out Verified patient identification, verified procedure, site/side was marked, verified correct patient position, special equipment/implants available, medications/allergies/relevant history reviewed, required imaging and test results available.   Sterile Technique Usual hand hygeine, masks, and gloves were used   Procedure Description Patient positioned in bed supine.  Sedation given as noted above.  Patient was intubated with endotracheal tube using Glidescope.  View was Grade 2 only posterior commissure .  Number of attempts was 1.  Colorimetric CO2 detector was consistent with tracheal placement.   Complications/Tolerance None; patient tolerated the procedure well. Chest X-ray is ordered to verify placement.   EBL None   Specimen(s) None  Marcelle Smiling, MD Board Certified by the ABIM, Pulmonary Diseases & Critical Care Medicine

## 2020-11-06 NOTE — TOC CAGE-AID Note (Addendum)
Transition of Care [facility]) - CAGE-AID Screening   Patient Details  Name: Jesse [name] MRN: [id number] Date of Birth: 1975-07-26  Transition of Care [facility]) CM/SW Contact:    Navy Belay C Tarpley-Carter, LCSWA Phone Number: 11/06/2020, 11:54 AM   Clinical Narrative: Pt is unable to participate in Cage Aid, due to pts altered mental status.  CSW will attached information for substance use to discharge paperwork.  Tavi Hoogendoorn Tarpley-Carter, MSW, LCSW-A Pronouns:  She/Her/Hers                          [place] Clinical Social WorkerTransitions of Care Cell:  [phone] [email or web address]   CAGE-AID Screening: Substance Abuse Screening unable to be completed due to: : Patient unable to participate

## 2020-11-06 NOTE — Anesthesia Postprocedure Evaluation (Signed)
Anesthesia Post Note  Patient: Kingdavid Leinbach  Procedure(s) Performed: 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 WITH REPLACEMENT OF ASCENDING AORTA AND HEMIARCH USING HEMASHIELD PLATINUM GRAFT AND HEMASHIELD GOLD 14 X GRAFT, RESUSPENSION OF NATIVE VALVE, AORTA TO RIGHT CAROTID BYPASS, AORTA TO RIGHT SUBCLAVIAN BYPASS (Chest) TRANSESOPHAGEAL ECHOCARDIOGRAM (TEE)     Patient location during evaluation: PACU Anesthesia Type: General Level of consciousness: sedated and patient remains intubated per anesthesia plan Pain management: pain level controlled Vital Signs Assessment: post-procedure vital signs reviewed and stable Respiratory status: patient on ventilator - see flowsheet for VS and patient remains intubated per anesthesia plan Cardiovascular status: stable Anesthetic complications: no   No notable events documented.  Last Vitals:  Vitals:   11/06/20 1345 11/06/20 1400  BP: (!) 145/78 139/76  Pulse: 77 75  Resp: (!) 22 (!) 26  Temp:    SpO2: 97% 100%    Last Pain:  Vitals:   11/06/20 1150  TempSrc: Oral  PainSc:                  Lewie Loron

## 2020-11-06 NOTE — Hospital Course (Addendum)
History of Present Illness:  Patient is a 45 year old male who presented to the emergency department with acute onset aphasia and blurry vision and has been referred for emergent surgical consultation due to discovery of acute type A aortic dissection.  Patient has history of anxiety and depression with longstanding tobacco and alcohol use but no other significant chronic medical problems, although the patient has not seen a physician for routine checkup in many years.  He was reportedly in his usual state of health until approximately 12 noon today when he was out mowing the lawn and he developed sudden onset chest pain radiating to his neck.  The pain seemed to subside but over the next several hours it became apparent the patient had developed severe acute aphasia as well as blurry vision in his right eye.  The patient's wife found him laying in the attic confused and called EMS.  The patient was noted to have severe receptive and expressive aphasia.  Code stroke was activated and the patient was brought to the emergency department at [facility] where initial CT of the head revealed no acute intracranial hemorrhage and only subtle findings but CT angiogram revealed acute dissection involving the aortic arch extending through the innominate artery into the right common carotid artery.  Emergent cardiothoracic surgical consultation was requested.  Upon arrival to the emergency department the patient was in the CT scanner and promptly transported back to the emergency department where his wife was at the bedside.  The patient is awake and alert and able to follow some simple commands although confused.  He answers appropriately to his name but he has difficulty finding words and cannot speak coherently.  He moves all 4 extremities without obvious weakness.  He denies ongoing chest pain but reports some mild headache.  He denies shortness of breath.  Patient is married and lives locally in  [place] with his wife.  He is currently out of work.  The patient reports that he smokes on a daily basis and has been drinking more recently since he has been out of a job.  He has not had any significant chronic medical problems.  He does take Xanax on a regular basis but no other prescription medications.  Course in Hospital: Mr. Hubbert was taken to the operating room emergently where the aortic dissection was repaired utilizing hypothermic circulatory arrest.  He was coagulopathic at the conclusion of the operation and was transfused with platelets, cryoprecipitate, and fresh frozen plasma.  Postoperative bleeding tapered off allowing chest closure and Mr. Sisemore was transported to the  CVICU in stable condition. He remained in a stable sinus rhythm left bundle branch block.  He was weaned from the ventilator and extubated by midmorning on the day of surgery he was noted to be confused and agitated.  He moved all 4 extremities not follow commands. Given his presentation with aphasia and blurry vision, we consulted neurology suspicion of acute CVA secondary to aortic dissection.  CT scan of the head was obtained that showed a large area of acute infarct in the territory of the middle cerebral artery and posterior cerebral arteries. On 7/15, pt had severe agitated delirium and worsening oxygen requirements, requiring heated high flow nasal cannula and a non-rebreather simultaneously. He was unable to sustain adequate oxygen saturation and he was intubated and sedated. On 7/16, patient was extubated to high flow nasal cannula. On 7/19, pt found to have SVT in L antecubital fossa. Was started on keflex, vancomycin, and  a heparin drip. ID was consulted. Blood cultures showed no growth, soft tissue ultrasound showed no abscess, so antibiotics were discontinued per ID recs. On 7/20 a rapid response was called for anisicoria. Head CT showed mild petechial hemorrhage with 1 extra mm of shift. No herniation. Neuro  exam remained unchanged.Neurology was consulted and no change in management resulted. Throughout admission in the ICU, pt has had persistent hyperactive delirium. Was initially on precedex, but was weaned and stopped on 7/18. Agitation managed with scheduled klonopin, seroquel, and valproic acid. Ativan prn. Pt was evaluated by physical therapy, Occupational Therapy, and speech language pathology. Primary medical team and physical therapy are in agreement that patient will be discharged to CIR for further rehabilitation as his mobility is declining and a more scheduled rehabilitation routine may improve his delirium.

## 2020-11-07 ENCOUNTER — Inpatient Hospital Stay (HOSPITAL_COMMUNITY): Payer: Medicaid Other

## 2020-11-07 DIAGNOSIS — I447 Left bundle-branch block, unspecified: Secondary | ICD-10-CM | POA: Diagnosis not present

## 2020-11-07 DIAGNOSIS — I6330 Cerebral infarction due to thrombosis of unspecified cerebral artery: Secondary | ICD-10-CM

## 2020-11-07 DIAGNOSIS — J9601 Acute respiratory failure with hypoxia: Secondary | ICD-10-CM

## 2020-11-07 DIAGNOSIS — G9340 Encephalopathy, unspecified: Secondary | ICD-10-CM

## 2020-11-07 DIAGNOSIS — R7989 Other specified abnormal findings of blood chemistry: Secondary | ICD-10-CM | POA: Diagnosis not present

## 2020-11-07 DIAGNOSIS — E43 Unspecified severe protein-calorie malnutrition: Secondary | ICD-10-CM

## 2020-11-07 DIAGNOSIS — R778 Other specified abnormalities of plasma proteins: Secondary | ICD-10-CM | POA: Diagnosis not present

## 2020-11-07 LAB — CBC
HCT: 25.9 % — ABNORMAL LOW (ref 39.0–52.0)
Hemoglobin: 8.8 g/dL — ABNORMAL LOW (ref 13.0–17.0)
MCH: 34 pg (ref 26.0–34.0)
MCHC: 34 g/dL (ref 30.0–36.0)
MCV: 100 fL (ref 80.0–100.0)
Platelets: 84 10*3/uL — ABNORMAL LOW (ref 150–400)
RBC: 2.59 MIL/uL — ABNORMAL LOW (ref 4.22–5.81)
RDW: 13.8 % (ref 11.5–15.5)
WBC: 7.9 10*3/uL (ref 4.0–10.5)
nRBC: 0 % (ref 0.0–0.2)

## 2020-11-07 LAB — POCT I-STAT 7, (LYTES, BLD GAS, ICA,H+H)
Acid-Base Excess: 6 mmol/L — ABNORMAL HIGH (ref 0.0–2.0)
Acid-Base Excess: 2 mmol/L (ref 0.0–2.0)
Bicarbonate: 30.1 mmol/L — ABNORMAL HIGH (ref 20.0–28.0)
Bicarbonate: 27.7 mmol/L (ref 20.0–28.0)
Calcium, Ion: 1.1 mmol/L — ABNORMAL LOW (ref 1.15–1.40)
Calcium, Ion: 1.11 mmol/L — ABNORMAL LOW (ref 1.15–1.40)
HCT: 23 % — ABNORMAL LOW (ref 39.0–52.0)
HCT: 23 % — ABNORMAL LOW (ref 39.0–52.0)
Hemoglobin: 7.8 g/dL — ABNORMAL LOW (ref 13.0–17.0)
Hemoglobin: 7.8 g/dL — ABNORMAL LOW (ref 13.0–17.0)
O2 Saturation: 96 %
O2 Saturation: 88 %
Patient temperature: 98.6
Patient temperature: 98.7
Potassium: 3.1 mmol/L — ABNORMAL LOW (ref 3.5–5.1)
Potassium: 3.4 mmol/L — ABNORMAL LOW (ref 3.5–5.1)
Sodium: 137 mmol/L (ref 135–145)
Sodium: 136 mmol/L (ref 135–145)
TCO2: 31 mmol/L (ref 22–32)
TCO2: 29 mmol/L (ref 22–32)
pCO2 arterial: 38.5 mmHg (ref 32.0–48.0)
pCO2 arterial: 48.9 mmHg — ABNORMAL HIGH (ref 32.0–48.0)
pH, Arterial: 7.501 — ABNORMAL HIGH (ref 7.350–7.450)
pH, Arterial: 7.361 (ref 7.350–7.450)
pO2, Arterial: 76 mmHg — ABNORMAL LOW (ref 83.0–108.0)
pO2, Arterial: 57 mmHg — ABNORMAL LOW (ref 83.0–108.0)

## 2020-11-07 LAB — MAGNESIUM
Magnesium: 1.9 mg/dL (ref 1.7–2.4)
Magnesium: 2.3 mg/dL (ref 1.7–2.4)
Magnesium: 2.2 mg/dL (ref 1.7–2.4)

## 2020-11-07 LAB — GLUCOSE, CAPILLARY
Glucose-Capillary: 103 mg/dL — ABNORMAL HIGH (ref 70–99)
Glucose-Capillary: 120 mg/dL — ABNORMAL HIGH (ref 70–99)
Glucose-Capillary: 108 mg/dL — ABNORMAL HIGH (ref 70–99)
Glucose-Capillary: 107 mg/dL — ABNORMAL HIGH (ref 70–99)
Glucose-Capillary: 88 mg/dL (ref 70–99)

## 2020-11-07 LAB — POTASSIUM: Potassium: 3.5 mmol/L (ref 3.5–5.1)

## 2020-11-07 LAB — PREALBUMIN: Prealbumin: 16.7 mg/dL — ABNORMAL LOW (ref 18–38)

## 2020-11-07 LAB — COMPREHENSIVE METABOLIC PANEL
ALT: 12 U/L (ref 0–44)
AST: 40 U/L (ref 15–41)
Albumin: 2.5 g/dL — ABNORMAL LOW (ref 3.5–5.0)
Alkaline Phosphatase: 41 U/L (ref 38–126)
Anion gap: 6 (ref 5–15)
BUN: 17 mg/dL (ref 6–20)
CO2: 30 mmol/L (ref 22–32)
Calcium: 7.8 mg/dL — ABNORMAL LOW (ref 8.9–10.3)
Chloride: 100 mmol/L (ref 98–111)
Creatinine, Ser: 1.2 mg/dL (ref 0.61–1.24)
GFR, Estimated: >60 mL/min (ref >60)
Glucose, Bld: 112 mg/dL — ABNORMAL HIGH (ref 70–99)
Potassium: 3.4 mmol/L — ABNORMAL LOW (ref 3.5–5.1)
Sodium: 136 mmol/L (ref 135–145)
Total Bilirubin: 0.4 mg/dL (ref 0.3–1.2)
Total Protein: 4.8 g/dL — ABNORMAL LOW (ref 6.5–8.1)

## 2020-11-07 LAB — PHOSPHORUS
Phosphorus: 3.3 mg/dL (ref 2.5–4.6)
Phosphorus: 3.3 mg/dL (ref 2.5–4.6)

## 2020-11-07 LAB — TROPONIN I (HIGH SENSITIVITY)
Troponin I (High Sensitivity): 4663 ng/L (ref <18)
Troponin I (High Sensitivity): 4604 ng/L (ref <18)

## 2020-11-07 MED ORDER — ENOXAPARIN SODIUM 30 MG/0.3ML IJ SOSY
30.0000 mg | PREFILLED_SYRINGE | INTRAMUSCULAR | Status: DC
  Administered 2020-11-07 – 2020-11-11 (×5): 30 mg via SUBCUTANEOUS
  Filled 2020-11-07 (×5): qty 0.3

## 2020-11-07 MED ORDER — SODIUM CHLORIDE 0.9 % IV SOLN
250.0000 mL | INTRAVENOUS | Status: DC
  Administered 2020-11-07: 250 mL via INTRAVENOUS

## 2020-11-07 MED ORDER — CLONAZEPAM 0.1 MG/ML ORAL SUSPENSION: 1.0000 mg | Freq: Three times a day (TID) | ORAL | Status: DC

## 2020-11-07 MED ORDER — ADULT MULTIVITAMIN W/MINERALS CH
1.0000 | ORAL_TABLET | Freq: Every day | ORAL | Status: DC
  Administered 2020-11-07 – 2020-11-08 (×2): 1
  Filled 2020-11-07 (×2): qty 1

## 2020-11-07 MED ORDER — VITAL 1.5 CAL PO LIQD
1000.0000 mL | ORAL | Status: DC
  Administered 2020-11-07 – 2020-11-09 (×4): 1000 mL

## 2020-11-07 MED ORDER — PROSOURCE TF PO LIQD
45.0000 mL | Freq: Two times a day (BID) | ORAL | Status: DC
  Filled 2020-11-07: qty 45

## 2020-11-07 MED ORDER — VITAL HIGH PROTEIN PO LIQD: 1000.0000 mL | ORAL | Status: DC

## 2020-11-07 MED ORDER — BUPROPION HCL 75 MG PO TABS
75.0000 mg | ORAL_TABLET | Freq: Two times a day (BID) | ORAL | Status: DC
  Administered 2020-11-08 (×3): 75 mg
  Filled 2020-11-07 (×7): qty 1

## 2020-11-07 MED ORDER — PROSOURCE TF PO LIQD
45.0000 mL | Freq: Three times a day (TID) | ORAL | Status: DC
  Administered 2020-11-07 – 2020-11-13 (×18): 45 mL
  Filled 2020-11-07 (×17): qty 45

## 2020-11-07 MED ORDER — CLONAZEPAM 0.5 MG PO TBDP
1.0000 mg | ORAL_TABLET | Freq: Three times a day (TID) | ORAL | Status: DC
  Administered 2020-11-07 – 2020-11-08 (×5): 1 mg
  Filled 2020-11-07 (×5): qty 2

## 2020-11-07 MED ORDER — MIDAZOLAM HCL 2 MG/2ML IJ SOLN
2.0000 mg | Freq: Once | INTRAMUSCULAR | Status: AC
  Administered 2020-11-07: 2 mg via INTRAVENOUS

## 2020-11-07 MED ORDER — MIDAZOLAM HCL (PF) 5 MG/ML IJ SOLN: 5.0000 mg | Freq: Once | INTRAMUSCULAR | Status: DC

## 2020-11-07 MED ORDER — FREE WATER
100.0000 mL | Freq: Three times a day (TID) | Status: DC
  Administered 2020-11-07 – 2020-11-13 (×17): 100 mL

## 2020-11-07 MED ORDER — MIDAZOLAM HCL (PF) 5 MG/ML IJ SOLN
5.0000 mg | Freq: Once | INTRAMUSCULAR | Status: DC
Start: 2020-11-07 — End: 2020-11-07

## 2020-11-07 MED ORDER — POTASSIUM CHLORIDE 10 MEQ/100ML IV SOLN
10.0000 meq | INTRAVENOUS | Status: AC
  Administered 2020-11-07 (×4): 10 meq via INTRAVENOUS
  Filled 2020-11-07 (×4): qty 100

## 2020-11-07 MED ORDER — MIDAZOLAM HCL 2 MG/2ML IJ SOLN
INTRAMUSCULAR | Status: AC
  Filled 2020-11-07: qty 2

## 2020-11-07 MED ORDER — NOREPINEPHRINE 4 MG/250ML-% IV SOLN
INTRAVENOUS | Status: AC
  Administered 2020-11-07: 2 ug/min via INTRAVENOUS
  Filled 2020-11-07: qty 250

## 2020-11-07 MED ORDER — NOREPINEPHRINE 4 MG/250ML-% IV SOLN: 2.0000 ug/min | INTRAVENOUS | Status: DC

## 2020-11-07 MED ORDER — METOPROLOL TARTRATE 5 MG/5ML IV SOLN
5.0000 mg | Freq: Three times a day (TID) | INTRAVENOUS | Status: DC
  Filled 2020-11-07: qty 5

## 2020-11-07 MED ORDER — PANTOPRAZOLE SODIUM 40 MG PO PACK
40.0000 mg | PACK | Freq: Every day | ORAL | Status: DC
  Administered 2020-11-08: 40 mg
  Filled 2020-11-07: qty 20

## 2020-11-07 MED ORDER — MIDAZOLAM HCL 2 MG/2ML IJ SOLN
2.0000 mg | Freq: Once | INTRAMUSCULAR | Status: AC | PRN
  Administered 2020-11-07: 2 mg via INTRAVENOUS
  Filled 2020-11-07 (×3): qty 2

## 2020-11-07 MED ORDER — ASPIRIN 81 MG PO CHEW
81.0000 mg | CHEWABLE_TABLET | Freq: Every day | ORAL | Status: DC
  Administered 2020-11-07 – 2020-11-08 (×2): 81 mg
  Filled 2020-11-07 (×2): qty 1

## 2020-11-07 MED ORDER — GADOBUTROL 1 MMOL/ML IV SOLN
6.7000 mL | Freq: Once | INTRAVENOUS | Status: AC | PRN
  Administered 2020-11-07: 6.7 mL via INTRAVENOUS

## 2020-11-07 MED ORDER — MIDAZOLAM HCL 2 MG/2ML IJ SOLN
4.0000 mg | INTRAMUSCULAR | Status: DC | PRN
  Administered 2020-11-07: 4 mg via INTRAVENOUS

## 2020-11-07 NOTE — Progress Notes (Addendum)
[address]       [place] [address]             [phone]        CARDIOTHORACIC SURGERY PROGRESS NOTE   R3 Days Post-Op Procedure(s) (LRB): 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 WITH REPLACEMENT OF ASCENDING AORTA AND HEMIARCH USING HEMASHIELD PLATINUM GRAFT AND HEMASHIELD GOLD 14 X GRAFT, RESUSPENSION OF NATIVE VALVE, AORTA TO RIGHT CAROTID BYPASS, AORTA TO RIGHT SUBCLAVIAN BYPASS (N/A) TRANSESOPHAGEAL ECHOCARDIOGRAM (TEE) (N/A)  Subjective: Events of last night noted.  Sedated with fentanyl and Precedex.   Objective: Vital signs: BP Readings from Last 1 Encounters:  11/07/20 (!) 99/59   Pulse Readings from Last 1 Encounters:  11/07/20 61   Resp Readings from Last 1 Encounters:  11/07/20 15   Temp Readings from Last 1 Encounters:  11/07/20 97.8 F (36.6 C) (Axillary)    Hemodynamics:    Physical Exam:  Heart- RRR, left bundle branch block, few PVC's.  Chest- Breath sounds are clear.  Abd- soft Exts- all well perfused. No edema.  Neuro- sedated.   Intake/Output from previous day: 07/14 0701 - 07/15 0700 In: 622.2 [I.V.:522.2; IV Piggyback:100] Out: 2630 [Urine:2400; Chest Tube:230] Intake/Output this shift: No intake/output data recorded.  Lab Results:  CBC: Recent Labs    11/06/20 2003 11/06/20 2029 11/07/20 0031 11/07/20 0422 11/07/20 0624  WBC 7.8  --  7.9  --   --   HGB 9.7*   < > 8.8* 7.8* 7.8*  HCT 29.1*   < > 25.9* 23.0* 23.0*  PLT 94*  --  84*  --   --    < > = values in this interval not displayed.     BMET:  Recent Labs    11/06/20 2003 11/06/20 2029 11/07/20 0031 11/07/20 0422 11/07/20 0624  NA 134*   < > 136 137 136  K 3.4*   < > 3.4* 3.1* 3.4*  CL 96*  --  100  --   --   CO2 28  --  30  --   --   GLUCOSE 125*  --  112*  --   --   BUN 15  --  17  --   --   CREATININE 1.03  --  1.20  --   --   CALCIUM 8.0*  --  7.8*  --   --    < > = values in this interval not displayed.       PT/INR:   Recent Labs    11/05/20 0326  LABPROT 16.5*  INR 1.3*     CBG (last 3)  Recent Labs    11/06/20 2344 11/07/20 0358 11/07/20 0810  GLUCAP 113* 103* 120*     ABG    Component Value Date/Time   PHART 7.361 11/07/2020 0624   PCO2ART 48.9 (H) 11/07/2020 0624   PO2ART 57 (L) 11/07/2020 0624   HCO3 27.7 11/07/2020 0624   TCO2 29 11/07/2020 0624   ACIDBASEDEF 1.0 11/05/2020 1305   O2SAT 88.0 11/07/2020 0624    EXAM: CHEST  1 VIEW   COMPARISON:  Chest x-ray 11/06/2020.   FINDINGS: An endotracheal tube is in place with tip 8.9 cm above the carina. A nasogastric tube is seen extending into the stomach, however, the tip of the nasogastric tube extends below the lower margin of the image. There is a surgical drain projecting over the medial aspect of the right hemithorax, either  a mediastinal drain or medially placed right-sided chest tube. Lung volumes are normal. Opacity in the left lower lobe, favored to represent postoperative atelectasis. Small left pleural effusion. Right lung is clear. No right pleural effusion. No appreciable pneumothorax. No evidence of pulmonary edema. Heart size is normal. Mediastinal contours are within normal limits. Surgical clips are noted to the right-side of the thoracic inlet.   IMPRESSION: 1. Support apparatus, as above. 2. Probable atelectasis in the left lower lobe. 3. Small left pleural effusion.     Electronically Signed   By: Trudie Reed M.D.   On: 11/07/2020 06:31    Assessment/Plan: S/P Procedure(s) (LRB): 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 WITH REPLACEMENT OF ASCENDING AORTA AND HEMIARCH USING HEMASHIELD PLATINUM GRAFT AND HEMASHIELD GOLD 14 X GRAFT, RESUSPENSION OF NATIVE VALVE, AORTA TO RIGHT CAROTID BYPASS, AORTA TO RIGHT SUBCLAVIAN BYPASS (N/A) TRANSESOPHAGEAL ECHOCARDIOGRAM (TEE) (N/A)  -POD3- repair of type A aortic dissection. VS and cardiac rhythm  have been stable. Severe  agitation last evening with associated hypoxia requiring heavy sedation and re-intubation.  ? Xanax withdrawal as his wife mentioned that he took this on a regular basis prior to admission.  Plan to remove the chest tubes later today.   -Right MCA/PCA CVA- appreciate input from Neuro. Continue supportive care, PT/OT/SLP.   OK to proceed with MRI at discretion of neuro team , ASA started.  -Malnutrition- request Cortrak today, start TF.  -DVT PPX- start enoxaparin today.   -Expected acute blood loss anemia-mld and stable. Monitor.    Leary Roca, PA-C [phone] 11/07/2020 8:24 AM     I have seen and examined the patient and agree with the assessment and plan as outlined.  According to patient's wife, the patient has a "severe problem with drug abuse" including multiple substances, mostly "pills"  including Xanax.  He also drinks beer on a daily basis, sometimes a lot.  Events of last night are suspicious for signs of withdrawal and/or agitation related to his preexisting anxiety disorder, complicated by his relatively large right hemispheric stroke.  The stroke was not a thromboembolic event.  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  Systemic anticoagulation should be avoided at this time but aspirin and low dose lovenox or heparin for DVT prophylaxis is reasonable.  He otherwise appears quite stable from a cardiac and vascular standpoint.  Hypoxia overnight likely primarily related to atelectasis and hypoventilation with perhaps mild volume overload.  CXR this morning looks remarkably clear.  At this point I favor placement of Cortrack feeding tube.  I have updated the patient's wife at bedside this morning.  At this point we will defer the majority of the patient's care to the CCM team.  Today is my last day at [facility].  The remaining TCTS team will cover in my  absence.   Purcell Nails, MD 11/07/2020 8:44 AM

## 2020-11-07 NOTE — Progress Notes (Signed)
eLink Physician-Brief Progress Note Patient Name: Jesse [name] DOB: [birth year] MRN: [id number]   Date of Service  11/07/2020  HPI/Events of Note  Troponin 4,663  >>> 4,604.  eICU Interventions  Given history of aortic dissection, will defer anticoagulation decision to cardio-thoracic surgery / cardiology.        Thomasene Lot Loye Vento 11/07/2020, 6:57 AM

## 2020-11-07 NOTE — Progress Notes (Signed)
RT note. Patient ETT advanced 23 to 25cm per Dr. Warrick Parisian. Patient achieving volume and sat 100%. RT will continue to monitor.

## 2020-11-07 NOTE — Progress Notes (Signed)
PT Cancellation Note  Patient Details Name: Jesse [name] MRN: [id number] DOB: August 07, 1975   Cancelled Treatment:    Reason Eval/Treat Not Completed: Patient not medically ready, remains intubated and sedated. Will follow-up for PT Evaluation as appropriate.  Ina Homes, PT, DPT Acute Rehabilitation Services  Pager [phone] Office [phone]  Malachy Chamber 11/07/2020, 7:43 AM

## 2020-11-07 NOTE — Progress Notes (Signed)
OT Cancellation Note  Patient Details Name: Jesse [name] MRN: [id number] DOB: 1975-05-03   Cancelled Treatment:    Reason Eval/Treat Not Completed: Patient not medically ready: had to be re-intubated and sedated since therapy orders received . Will follow-up for OT Evaluation as appropriate.  Ignacia Palma, OTR/L Acute Rehab Services Pager [phone] Office [phone]    Evette Georges 11/07/2020, 8:28 AM

## 2020-11-07 NOTE — Progress Notes (Signed)
Critical care attending attestation note:  Patient seen and examined and relevant ancillary tests reviewed.  I agree with the assessment and plan of care as outlined by Kreg Shropshire, NP{ .   Synopsis of assessment and plan:  45 year old man who is critically ill due to acute hypoxic respiratory failure requiring mechanical ventilation and severe agitation requiring titration of sedative infusions.   Started enteral sedation with excellent effect and have been able to wean both sedation and vasopressors over the course of the day.   Should be ready for SBT and trial of extubation in am.   CRITICAL CARE Performed by: Lynnell Catalan   Total critical care time: 40 minutes  Critical care time was exclusive of separately billable procedures and treating other patients.  Critical care was necessary to treat or prevent imminent or life-threatening deterioration.  Critical care was time spent personally by me on the following activities: development of treatment plan with patient and/or surrogate as well as nursing, discussions with consultants, evaluation of patient's response to treatment, examination of patient, obtaining history from patient or surrogate, ordering and performing treatments and interventions, ordering and review of laboratory studies, ordering and review of radiographic studies, pulse oximetry, re-evaluation of patient's condition and participation in multidisciplinary rounds.  Lynnell Catalan, MD [facility] ICU Physician [facility] [place] [facility]  Pager: [phone] Mobile: [phone] After hours: [phone].  11/07/2020, 5:31 PM

## 2020-11-07 NOTE — Progress Notes (Signed)
eLink Physician-Brief Progress Note Patient Name: Jesse [name] DOB: 03-Dec-1975 MRN: [id number]   Date of Service  11/07/2020  HPI/Events of Note  Patient is hypotensive, he is being diuresed aggressively and is net negative 2 liters over the past 48 hours.  eICU Interventions  500 ml iv fluid bolus ordered, peripheral Norepinephrine ordered pending completion of fluid bolus, sedation weaned temporarily.        Migdalia Dk 11/07/2020, 9:43 PM

## 2020-11-07 NOTE — Progress Notes (Signed)
eLink Physician-Brief Progress Note Patient Name: Jesse [name] DOB: August 06, 1975 MRN: [id number]   Date of Service  11/07/2020  HPI/Events of Note  Patient needs safety  / restraints orders renewed. Patient needs electrolyte replacement protocol.  eICU Interventions  Orders entered.        Thomasene Lot Kenneth Cuaresma 11/07/2020, 3:17 AM

## 2020-11-07 NOTE — Significant Event (Addendum)
OVERNIGHT COVERAGE CRITICAL CARE PROGRESS NOTE  Troponin > 4600.  Troponin was checked due to QRS widening reported as "new" LBBB on 12-lead EKG.  Patient is intubated and sedated at this time.  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 Continue to trend troponin. Already on aspirin.  Will need to discuss heparinization with TCTS in light of recent aortic arch surgery.  Awaiting call back.  Marcelle Smiling, MD Board Certified by the [employer], Pulmonary Diseases & Critical Care Medicine

## 2020-11-07 NOTE — Progress Notes (Signed)
Per Dr. Cornelius Moras, chest tubes to be removed today if drainage less than 50 ml total by 1200.

## 2020-11-07 NOTE — Procedures (Addendum)
Cortrak  Person Inserting Tube:  Ellesse Antenucci, Verdon Cummins, RD Tube Type:  Cortrak - 43 inches Tube Size:  10 Tube Location:  Left nare Initial Placement:  Stomach Secured by: Bridle Technique Used to Measure Tube Placement:  Marking at nare/corner of mouth Cortrak Secured At:  71 cm  Cortrak Tube Team Note:  Consult received to place a Cortrak feeding tube.   X-ray is required, abdominal x-ray has been ordered by the Cortrak team. Please confirm tube placement before using the Cortrak tube.   If the tube becomes dislodged please keep the tube and contact the Cortrak team at [email or web address] (password [email or web address]) for replacement.  If after hours and replacement cannot be delayed, place a NG tube and confirm placement with an abdominal x-ray.    Eugene Gavia, MS, RD, LDN (she/her/hers) RD pager number and weekend/on-call pager number located in Amion.

## 2020-11-07 NOTE — Progress Notes (Signed)
CCM paged regarding Troponin elevated.Jesse [name]

## 2020-11-07 NOTE — Progress Notes (Signed)
STROKE TEAM PROGRESS NOTE   SUBJECTIVE (INTERVAL HISTORY) His wife is at the bedside.  Per wife, he is right handed person. He had agitation and desating last night and was intubated. Now on vent and sedation. Vital stable   OBJECTIVE Temp:  [97.8 F (36.6 C)-99.2 F (37.3 C)] 97.8 F (36.6 C) (07/15 0813) Pulse Rate:  [44-89] 47 (07/15 1245) Cardiac Rhythm: Sinus bradycardia (07/15 1200) Resp:  [11-34] 15 (07/15 1245) BP: (89-150)/(57-87) 90/63 (07/15 1245) SpO2:  [75 %-100 %] 100 % (07/15 1245) FiO2 (%):  [40 %-100 %] 40 % (07/15 1157) Weight:  [67.4 kg] 67.4 kg (07/15 0624)  Recent Labs  Lab 11/06/20 1950 11/06/20 2344 11/07/20 0358 11/07/20 0810 11/07/20 1147  GLUCAP 118* 113* 103* 120* 108*   Recent Labs  Lab 11/05/20 0920 11/05/20 1110 11/05/20 1208 11/05/20 1305 11/06/20 0510 11/06/20 0615 11/06/20 2003 11/06/20 2029 11/07/20 0031 11/07/20 0422 11/07/20 0624  NA 136   < > 135   < > 136  --  134* 137 136 137 136  K 4.0   < > 3.6   < > >7.5*   < > 3.4* 3.3* 3.4* 3.1* 3.4*  CL 106  --  103  --  106  --  96*  --  100  --   --   CO2 24  --  24  --  26  --  28  --  30  --   --   GLUCOSE 134*  --  147*  --  116*  --  125*  --  112*  --   --   BUN 13  --  13  --  11  --  15  --  17  --   --   CREATININE 1.18  --  1.18  --  1.11  --  1.03  --  1.20  --   --   CALCIUM 8.1*  --  7.7*  --  6.9*  --  8.0*  --  7.8*  --   --   MG 3.0*  --  2.5*  --  1.8  --  2.1  --  1.9  --   --   PHOS  --   --   --   --   --   --  3.5  --   --   --   --    < > = values in this interval not displayed.   Recent Labs  Lab 11/04/20 1837 11/07/20 0031  AST 20 40  ALT 15 12  ALKPHOS 55 41  BILITOT 0.7 0.4  PROT 6.3* 4.8*  ALBUMIN 3.7 2.5*   Recent Labs  Lab 11/04/20 1837 11/04/20 1840 11/05/20 0920 11/05/20 1110 11/05/20 1208 11/05/20 1305 11/06/20 0510 11/06/20 2003 11/06/20 2029 11/07/20 0031 11/07/20 0422 11/07/20 0624  WBC 12.7*   < > 6.8  --  9.6  --  8.2 7.8   --  7.9  --   --   NEUTROABS 10.8*  --   --   --   --   --   --   --   --   --   --   --   HGB 14.5   < > 9.6*   < > 9.8*   < > 8.7* 9.7* 8.8* 8.8* 7.8* 7.8*  HCT 42.4   < > 27.9*   < > 29.4*   < > 26.0* 29.1* 26.0* 25.9* 23.0* 23.0*  MCV 97.2   < >  98.2  --  99.3  --  102.4* 98.6  --  100.0  --   --   PLT 170   < > 107*  --  121*  --  90* 94*  --  84*  --   --    < > = values in this interval not displayed.   No results for input(s): CKTOTAL, CKMB, CKMBINDEX, TROPONINI in the last 168 hours. Recent Labs    11/04/20 1837 11/05/20 0326  LABPROT 12.9 16.5*  INR 1.0 1.3*   No results for input(s): COLORURINE, LABSPEC, PHURINE, GLUCOSEU, HGBUR, BILIRUBINUR, KETONESUR, PROTEINUR, UROBILINOGEN, NITRITE, LEUKOCYTESUR in the last 72 hours.  Invalid input(s): APPERANCEUR     Component Value Date/Time   CHOL 112 11/06/2020 0510   TRIG 196 (H) 11/06/2020 0510   HDL 24 (L) 11/06/2020 0510   CHOLHDL 4.7 11/06/2020 0510   VLDL 39 11/06/2020 0510   LDLCALC 49 11/06/2020 0510   Lab Results  Component Value Date   HGBA1C 5.2 11/06/2020   No results found for: LABOPIA, COCAINSCRNUR, LABBENZ, AMPHETMU, THCU, LABBARB  Recent Labs  Lab 11/04/20 1837  ETH <10    I have personally reviewed the radiological images below and agree with the radiology interpretations.  DG Chest 1 View  Result Date: 11/07/2020 CLINICAL DATA:  45 year old male with code stroke. Attempted to pull out endotracheal tube. EXAM: CHEST  1 VIEW COMPARISON:  Chest x-ray 11/06/2020. FINDINGS: An endotracheal tube is in place with tip 8.9 cm above the carina. A nasogastric tube is seen extending into the stomach, however, the tip of the nasogastric tube extends below the lower margin of the image. There is a surgical drain projecting over the medial aspect of the right hemithorax, either a mediastinal drain or medially placed right-sided chest tube. Lung volumes are normal. Opacity in the left lower lobe, favored to represent  postoperative atelectasis. Small left pleural effusion. Right lung is clear. No right pleural effusion. No appreciable pneumothorax. No evidence of pulmonary edema. Heart size is normal. Mediastinal contours are within normal limits. Surgical clips are noted to the right-side of the thoracic inlet. IMPRESSION: 1. Support apparatus, as above. 2. Probable atelectasis in the left lower lobe. 3. Small left pleural effusion. Electronically Signed   By: Trudie Reed M.D.   On: 11/07/2020 06:31   CT HEAD WO CONTRAST  Result Date: 11/06/2020 CLINICAL DATA:  44 year old male code stroke presentation. Type a aortic dissection. Right carotid involvement and occlusion, distal right M2 occlusion. Subsequent encounter. EXAM: CT HEAD WITHOUT CONTRAST TECHNIQUE: Contiguous axial images were obtained from the base of the skull through the vertex without intravenous contrast. COMPARISON:  CTA head and neck, and CT headi 11/04/2020 FINDINGS: Brain: Confluent cytotoxic edema throughout a roughly 8 cm area of the posterior right hemisphere involving the posterior right MCA and right MCA/PCA watershed area. See sagittal image 15. Mild regional mass effect. Mild mass effect on the right lateral ventricle and trace leftward midline shift (series 3, image 23). No hemorrhagic transformation. Basilar cisterns remain patent. No ventriculomegaly. Deep gray nuclei appear stable. Left hemisphere and posterior fossa gray-white matter differentiation is stable. Vascular: No large vessel hyperdensity. Skull: Negative. Sinuses/Orbits: Visualized paranasal sinuses and mastoids are stable and well aerated. Other: Visualized orbits and scalp soft tissues are within normal limits. IMPRESSION: 1. Relatively large infarct with cytotoxic edema in the posterior Right MCA and MCA/PCA watershed territories. Mild mass effect including trace leftward midline shift. No hemorrhagic transformation. 2. Preserved  gray-white matter differentiation elsewhere.  Electronically Signed   By: Odessa FlemingH  Hall M.D.   On: 11/06/2020 06:33   DG CHEST PORT 1 VIEW  Result Date: 11/06/2020 CLINICAL DATA:  45 year old male status post intubation. EXAM: PORTABLE CHEST 1 VIEW COMPARISON:  Chest radiograph dated 11/06/2020. FINDINGS: Endotracheal tube with tip approximately 5.5 cm above the carina. Interval removal of the right IJ catheter. Two inferiorly accessed mediastinal drains again noted. Probable small left pleural effusion. Right infrahilar hazy density, possibly atelectasis. No obvious pneumothorax. Stable cardiomegaly. No acute osseous pathology. IMPRESSION: 1. Interval removal of the right IJ catheter. 2. Endotracheal tube above the carina. 3. Probable small left pleural effusion. Electronically Signed   By: Elgie CollardArash  Radparvar M.D.   On: 11/06/2020 20:42   DG Chest Port 1 View  Addendum Date: 11/06/2020   ADDENDUM REPORT: 11/06/2020 09:13 ADDENDUM: Also with slight increased retrocardiac opacification and RIGHT basilar opacification since prior study. These findings and findings in the initial report were discussed with the provider as outlined below. These results were called by telephone at the time of interpretation on 11/06/2020 at 9:13 am to provider Tressie StalkerLARENCE OWEN , who verbally acknowledged these results. Electronically Signed   By: Donzetta KohutGeoffrey  Wile M.D.   On: 11/06/2020 09:13   Result Date: 11/06/2020 CLINICAL DATA:  Post aortic dissection repair in a 45 year old male. EXAM: PORTABLE CHEST 1 VIEW COMPARISON:  November 05, 2020. FINDINGS: EKG leads project over the chest. Since the previous study the patient has been extubated. Chest support tubes project over the heart terminating just below the clavicular heads with respect to the more rightward oriented tube, not changed in position. The LEFT-sided tube terminates over the LEFT chest in the retrocardiac region with similar appearance. RIGHT IJ sheath in similar position Swan-Ganz catheter now removed. Persistent tiny RIGHT  apical pneumothorax, unchanged. Trachea midline. Cardiomediastinal contours and hilar structures are stable. Graded opacity at the RIGHT lung base is subtle with obscured LEFT hemidiaphragm along the medial aspect when compared to the prior study. Possible deep sulcus sign on the LEFT otherwise no evidence of LEFT-sided pneumothorax. On limited assessment no acute skeletal process. IMPRESSION: 1. Persistent tiny RIGHT apical pneumothorax. 2. Possible deep sulcus sign on the LEFT potentially a very small LEFT pneumothorax. Attention on follow-up. 3. Interval extubation, removal of gastric tube and removal of Swan-Ganz catheter, otherwise stable position of support apparatus. A call is out to the referring provider to further discuss findings in the above case. Electronically Signed: By: Donzetta KohutGeoffrey  Wile M.D. On: 11/06/2020 09:05   DG Chest Port 1 View  Result Date: 11/05/2020 CLINICAL DATA:  45 year old male with open heart surgery. EXAM: PORTABLE CHEST 1 VIEW COMPARISON:  Chest CT dated 11/04/2020. FINDINGS: Right IJ Swan-Ganz catheter with tip likely close to the bifurcation of the main pulmonary trunk. Endotracheal tube with tip approximately 6.5 cm above the carina. Enteric tube extends below the diaphragm with tip beyond the inferior margin of the image. Inferiorly accessed mediastinal drain. The lungs are clear. There is no pleural effusion. Tiny linear lucency in the right apex may be artifactual or represent minimal right pneumothorax measuring approximately 4 mm in thickness. Close follow-up recommended. The cardiac silhouette is within limits. No acute osseous pathology. IMPRESSION: 1. Status post open heart surgery with support apparatus as above. 2. Possible tiny right apical pneumothorax. Attention on follow-up imaging recommended. These results were called by telephone at the time of interpretation on 11/05/2020 at 4:03 am to the patient's nurse Jess BartersKock, who  verbally acknowledged these results.  Electronically Signed   By: Elgie Collard M.D.   On: 11/05/2020 04:07   DG Abd Portable 1V  Result Date: 11/07/2020 CLINICAL DATA:  Feeding tube placement EXAM: PORTABLE ABDOMEN - 1 VIEW COMPARISON:  11/07/2020 at 0310 hours FINDINGS: Interval placement of large bore feeding tube which extends below the diaphragm, distal tip beyond the inferior margin of the film. Additional enteric tube is again seen terminating within the gastric body. Mediastinal drain and left basilar chest tube are seen. IMPRESSION: Interval placement of feeding tube which extends below the diaphragm into the stomach with distal tip beyond the inferior margin of the film. Electronically Signed   By: Duanne Guess D.O.   On: 11/07/2020 12:33   DG Abd Portable 1V  Result Date: 11/07/2020 CLINICAL DATA:  45 year male status post feeding tube placement. EXAM: PORTABLE ABDOMEN - 1 VIEW COMPARISON:  Chest radiograph dated 11/06/2020 and CT of the chest abdomen pelvis dated 11/04/2020. FINDINGS: Partially visualized enteric tube with side-port just distal to the GE junction and tip in the proximal body of the stomach. Inferiorly accessed mediastinal drains noted. No bowel dilatation or evidence of obstruction. No free air. Probable small left pleural effusion. IMPRESSION: Enteric tube with tip in the proximal body of the stomach. Electronically Signed   By: Elgie Collard M.D.   On: 11/07/2020 03:35   ECHO INTRAOPERATIVE TEE  Result Date: 11/05/2020  *INTRAOPERATIVE TRANSESOPHAGEAL REPORT *  Patient Name:   Jesse [name] Date of Exam: 11/04/2020 Medical Rec #:  [id number]     Height: Accession #:    [id number]    Weight:       145.5 lb Date of Birth:  02-01-1976    BSA:          1.63 m Patient Age:    44 years      BP:           163/100 mmHg Patient Gender: M             HR:           70 bpm. Exam Location:  Inpatient Transesophogeal exam was perform intraoperatively during surgical procedure. Patient was closely monitored  under general anesthesia during the entirety of examination. Indications:     Aortic dissection Performing Phys: [id number] CLARENCE H OWEN Diagnosing Phys: Lewie Loron MD Complications: No known complications during this procedure. POST-OP IMPRESSIONS - Left Ventricle: The left ventricle is unchanged from pre-bypass. - Right Ventricle: The right ventricle appears unchanged from pre-bypass. - Aorta: there is no dissection present in the aorta A graft was placed in the ascending aorta for repair. - Left Atrial Appendage: The left atrial appendage appears unchanged from pre-bypass. - Aortic Valve: The aortic valve appears unchanged from pre-bypass. - Mitral Valve: The mitral valve appears unchanged from pre-bypass. - Tricuspid Valve: The tricuspid valve appears unchanged from pre-bypass. PRE-OP FINDINGS  Left Ventricle: The left ventricle has normal systolic function, with an ejection fraction of 55-60%. The cavity size was normal. There is severe concentric left ventricular hypertrophy. Right Ventricle: The right ventricle has normal systolic function. The cavity was normal. There is no increase in right ventricular wall thickness. Left Atrium: Left atrial size was normal in size. No left atrial/left atrial appendage thrombus was detected. Right Atrium: Right atrial size was normal in size. Interatrial Septum: The interatrial septum was not assessed. There is no evidence of a patent foramen ovale. Pericardium: Trivial pericardial effusion is present. Mitral Valve:  The mitral valve is normal in structure. Mitral valve regurgitation is trivial by color flow Doppler. Tricuspid Valve: The tricuspid valve was normal in structure. Tricuspid valve regurgitation is trivial by color flow Doppler. Aortic Valve: The aortic valve is tricuspid Aortic valve regurgitation is trivial by color flow Doppler. There is no stenosis of the aortic valve. Pulmonic Valve: The pulmonic valve was normal in structure. Pulmonic valve regurgitation  is not visualized by color flow Doppler. Aorta: There is evidence of a dissection in the aortic root, ascending aorta and aortic arch. Shunts: There is no evidence of an atrial septal defect.  Lewie Loron MD Electronically signed by Lewie Loron MD Signature Date/Time: 11/05/2020/2:44:37 AM    Final    CT Angio Chest/Abd/Pel for Dissection W and/or W/WO  Result Date: 11/04/2020 CLINICAL DATA:  Further evaluation of aortic dissection seen on prior same day imaging EXAM: CT ANGIOGRAPHY CHEST, ABDOMEN AND PELVIS TECHNIQUE: Non-contrast CT of the chest was initially obtained. Multidetector CT imaging through the chest, abdomen and pelvis was performed using the standard protocol during bolus administration of intravenous contrast. Multiplanar reconstructed images and MIPs were obtained and reviewed to evaluate the vascular anatomy. CONTRAST:  OMNIPAQUE IOHEXOL 350 MG/ML SOLN COMPARISON:  Same day CT neck. FINDINGS: CTA CHEST FINDINGS Cardiovascular: Ascending aortic dissection extending from superior to the sino-tubular junction to the transverse arch with continuation of the dissection into the innominate artery with thrombus in the false lumen of the innominate artery. No involvement of the descending thoracic aorta. The left main and right coronary arteries not appear to be involved and are patent as are the portions of the LAD and left circumflex. Normal size heart. No significant pericardial effusion/thickening. No central pulmonary embolus. Mediastinum/Nodes: No mediastinal fluid collection. No discrete thyroid nodule. No pathologically enlarged mediastinal, hilar or axillary lymph nodes. The trachea and esophagus are grossly unremarkable. Lungs/Pleura: No suspicious pulmonary nodules or masses. No focal consolidation. No pleural effusion. No pneumothorax. Musculoskeletal: No acute osseous abnormality. Review of the MIP images confirms the above findings. CTA ABDOMEN AND PELVIS FINDINGS VASCULAR  Aorta: Aortic atherosclerosis. Normal caliber abdominal aorta without aneurysm, dissection, vasculitis or significant stenosis. Celiac: Patent without evidence of aneurysm, dissection, vasculitis or significant stenosis. SMA: Patent without evidence of aneurysm, dissection, vasculitis or significant stenosis. Renals: Both renal arteries are patent without evidence of aneurysm, dissection, vasculitis, fibromuscular dysplasia or significant stenosis. IMA: Patent without evidence of aneurysm, dissection, vasculitis or significant stenosis. Inflow: Patent without evidence of aneurysm, dissection, vasculitis or significant stenosis. Veins: No obvious venous abnormality within the limitations of this arterial phase study. Review of the MIP images confirms the above findings. NON-VASCULAR Hepatobiliary: No suspicious hepatic lesion. Gallbladder is unremarkable. No biliary ductal dilation. Pancreas: Within normal limits. Spleen: Within normal limits. Adrenals/Urinary Tract: Adrenal glands are unremarkable. Kidneys are normal, without renal calculi, solid enhancing lesion, or hydronephrosis. Symmetric renal enhancement. Bladder is unremarkable. Stomach/Bowel: Stomach is within normal limits. Appendix appears normal. No evidence of bowel wall thickening, distention, or inflammatory changes. Lymphatic: No pathologically enlarged abdominal or pelvic lymph nodes. Reproductive: Prostate is unremarkable. Other: No abdominopelvic ascites.  No pneumoperitoneum. Musculoskeletal: Mild multilevel degenerative change of the spine. No acute osseous abnormality Review of the MIP images confirms the above findings. IMPRESSION: 1. 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 No  involvement of the descending thoracic aorta. No pericardial effusion  or mediastinal fluid. 2. No acute findings within the abdomen or pelvis. 3.  Aortic Atherosclerosis (ICD10-I70.0). Electronically Signed   By: Maudry Mayhew MD   On: 11/04/2020 20:22   CT HEAD CODE STROKE WO CONTRAST  Result Date: 11/04/2020 CLINICAL DATA:  Code stroke.  Aphasia EXAM: CT HEAD WITHOUT CONTRAST TECHNIQUE: Contiguous axial images were obtained from the base of the skull through the vertex without intravenous contrast. COMPARISON:  None. FINDINGS: Brain: There is no acute intracranial hemorrhage or mass effect. Question small subtle loss of gray-white differentiation in the right insular region. Ventricles and sulci are normal in size and configuration. Vascular: Question of hyperdensity along distal right M2 or proximal M3 MCA branch in the posterior sylvian fissure. Skull: Unremarkable. Sinuses/Orbits: No acute abnormality. Other: Mastoid air cells are clear. ASPECTS [facility] Stroke Program Early CT Score) - Ganglionic level infarction (caudate, lentiform nuclei, internal capsule, insula, M1-M3 cortex): 6 - Supraganglionic infarction (M4-M6 cortex): 3 Total score (0-10 with 10 being normal): 9 IMPRESSION: No acute intracranial hemorrhage. Question subtle small loss of gray-white differentiation in the right insular region (ASPECT score is 9). Possible hyperdense distal right M2 or proximal M3 MCA branch in the posterior sylvian fissure. These results were communicated to Dr. Iver Nestle at 6:45 pm on 11/04/2020 by text page via the [facility] messaging system. Electronically Signed   By: Guadlupe Spanish M.D.   On: 11/04/2020 18:59   CT ANGIO HEAD NECK W WO CM (CODE STROKE)  Result Date: 11/04/2020 CLINICAL DATA:  Code stroke EXAM: CT ANGIOGRAPHY HEAD AND NECK TECHNIQUE: Multidetector CT imaging of the head and neck was performed using the standard protocol during bolus administration of intravenous contrast. Multiplanar CT image reconstructions and MIPs were obtained to evaluate the vascular anatomy.  Carotid stenosis measurements (when applicable) are obtained utilizing NASCET criteria, using the distal internal carotid diameter as the denominator. CONTRAST:  78mL OMNIPAQUE IOHEXOL 350 MG/ML SOLN COMPARISON:  None. FINDINGS: CTA NECK Aortic arch: A dissection flap is present within the included ascending aorta extending into the transverse portion of the arch but not the descending portion. Dissection involves the innominate artery with thrombosis of the false lumen. This continues into the right common carotid. Left common carotid and left subclavian artery origins are patent. Right carotid system: As noted above, there is extension of dissection into the common carotid with occlusion of the false lumen resulting in narrowing of the true lumen. Minimal diameter of 1.5 mm. The external carotid origin is patent. There is minimal contrast enhancement at the ICA origin and subsequent minimal enhancement along portions of the cervical ICA. Left carotid system: Patent. Trace calcified plaque along the proximal ICA. No stenosis. Vertebral arteries: Patent.  Right vertebral artery is dominant. Skeleton: Degenerative changes of the cervical spine. Other neck: Unremarkable. Upper chest: Included upper lungs are clear. Review of the MIP images confirms the above findings CTA HEAD Anterior circulation: Possible faint enhancement of the intracranial right internal carotid artery. There is reconstitution at the terminus. Right M1 and proximal M2 MCA are patent. There is occlusion of a small right distal M2 and proximal M3 MCA branch corresponding to abnormality on noncontrast CT. Left middle and both anterior cerebral arteries are patent. Anterior communicating artery is present. Posterior circulation: Intracranial vertebral arteries are patent. The left vertebral artery becomes small in caliber after PICA origin. Basilar artery is patent. Major cerebellar artery origins are patent. Posterior cerebral arteries are patent  there is a left posterior communicating artery. Venous sinuses: Patent as allowed by contrast bolus timing. 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 This extends into the right common carotid with narrowing of the true lumen to a minimal diameter of 1.5 mm. Minimal enhancement within the right cervical and intracranial ICA. Reconstitution at the right ICA terminus. No proximal intracranial vessel occlusion. 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 These results were called by telephone at the time of interpretation on 11/04/2020 at 6:59 pm to provider [facility] , who verbally acknowledged these results. Electronically Signed   By: Guadlupe Spanish M.D.   On: 11/04/2020 19:10     PHYSICAL EXAM  Temp:  [97.8 F (36.6 C)-99.2 F (37.3 C)] 97.8 F (36.6 C) (07/15 0813) Pulse Rate:  [44-89] 47 (07/15 1245) Resp:  [11-34] 15 (07/15 1245) BP: (89-150)/(57-87) 90/63 (07/15 1245) SpO2:  [75 %-100 %] 100 % (07/15 1245) FiO2 (%):  [40 %-100 %] 40 % (07/15 1157) Weight:  [67.4 kg] 67.4 kg (07/15 0624)  General - Well nourished, well developed, intubated on sedation.  Ophthalmologic - fundi not visualized due to noncooperation.  Cardiovascular - Regular rate and rhythm.  Neuro - intubated on sedation, eyes close but open with flashlight, not following commands. With forced eye opening, eyes in mid position, not blinking to visual threat, doll's eyes present, not tracking, PERRL. Corneal reflex weakly present bilaterally, gag and cough absent. Breathing  over the vent.  Facial symmetry not able to test due to ET tube.  Tongue protrusion not cooperative. On pain stimulation, moving all extremities but not against gravity. DTR  diminished and no babinski. Sensation, coordination and gait not tested.     ASSESSMENT/PLAN Jesse [name] is a 45 y.o. male with history of anxiety, now seeing doctor for 22 years admitted for confusion, headache, blurry vision and aphasia. No tPA given due to outside window and aortic dissection.    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  Ongoing aggressive stroke risk factor management Therapy recommendations: Pending Disposition: Pending  Aortic dissection CTA head and neck showed type A aortic dissection, not involving descending aorta CTS on board Status post repair MRA neck pending  Respiratory failure Intubated on vent CCM on board On sedation with intermittent Versed IV  Dysphagia Currently n.p.o. Speech on board Core track placed On tube feeding  Acute blood loss anemia Hb 7.1-7.5-8.5-9.5-10.2->8.7-> 7.8 Status post PRBC during procedure Close monitoring On ASA  Delirium  Multifactorial, likely related to stroke, procedure, anemia Intubated Soft wrist restrain On precedex   Other Stroke Risk Factors   Other Active Problems Hyponatremia Na [id number]  Hospital day # 2  This patient is critically ill due to respiratory failure, aortic dissection status post surgery, large right MCA stroke, severe anemia, dysphagia and  delirium and at significant risk of neurological worsening, death form recurrent stroke, hemorrhagic conversion, CHF, cardiac arrest, seizure. This patient's care requires constant monitoring of vital signs, hemodynamics, respiratory and cardiac monitoring, review of multiple databases, neurological assessment, discussion with family, other specialists and medical decision making of high complexity. I spent 45 minutes of neurocritical care time in the care of this patient. I had long discussion with wife at bedside, updated pt current condition, treatment plan and potential prognosis, and answered all the questions.  She expressed understanding and appreciation.    Marvel Plan, MD PhD Stroke Neurology 11/07/2020 2:23 PM    To contact Stroke Continuity provider, please refer to [email or web address]. After hours, contact General Neurology

## 2020-11-07 NOTE — Progress Notes (Signed)
eLink Physician-Brief Progress Note Patient Name: Neil Brickell DOB: 1976/01/31 MRN: [id number]   Date of Service  11/07/2020  HPI/Events of Note  ABG reviewed.  eICU Interventions  Tidal volume reduced from 8 ml / kg to 7 ml / kg, ABG in one hour.        Thomasene Lot Jahvier Aldea 11/07/2020, 5:02 AM

## 2020-11-07 NOTE — Progress Notes (Addendum)
eLink Physician-Brief Progress Note Patient Name: Jesse [name] DOB: August 15, 1975 MRN: [id number]   Date of Service  11/07/2020  HPI/Events of Note  Patient woke up with transient desaturation likely secondary to  ventilator dyssynchrony, he is now resting quietly and saturation is 97 %, CXR is clear but ET tube is at the clavicles and needs to be advanced approximately 2 cm, RT was notified, he has a sinus bradycardia due to Precedex + Fentanyl gtt but his blood pressure is fine.  eICU Interventions  ET tube advancement ordered.        Thomasene Lot Shameer Molstad 11/07/2020, 6:09 AM

## 2020-11-07 NOTE — Progress Notes (Signed)
NAME:  Axil Copeman, MRN:  [id number], DOB:  1975/09/18, LOS: 2 ADMISSION DATE:  11/04/2020, CONSULTATION DATE:  7/14 REFERRING MD:  Dr. Cornelius Moras, CHIEF COMPLAINT:  hypoxia   History of Present Illness:   45 year old male with PMH as below, whish is significant for anxiety, depression, and tobacco abuse (Not treated medically; self medicated with ETOH). Admitted 7/12 w/ new stroke due to aortic dissection resulting in loss of flow to right carotid.,  Emergently underwent replacement of ascending aorta and proximal transverse aortic arch, Aorta to right common carotid bypass, and aorta to right subclavian bypass. Postoperatively he was transferred to the CVICU. 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   Had severe agitated delirium and then worsening oxygen requirements on am of 7/15 requiring intubation. PCCM asked to assist with care   Pertinent  Medical History   has a past medical history of Anxiety, Depression, Essential hypertension, and S/P aortic dissection repair (11/05/2020).  Significant Hospital Events: Including procedures, antibiotic start and stop dates in addition to other pertinent events   7/12 admit for aortic dissection, emergently for repair.  7/13.. Agitated at times.  7/14 agitation worsened and he became hypoxemic requiring heated high flow and NRB simultaneously. Unable to sustain sats over 85%. PCCM called emergently to bedside by bedside RN.  Intubated. New Left BBB. Placed on fent and precedex.  7/15 PCCM assuming care. Added clonazepam   Interim History / Subjective:  Sedated on vent  Objective   Blood pressure (Abnormal) 99/59, pulse 61, temperature 97.8 F (36.6 C), temperature source Axillary, resp. rate 15, height 6\' 2"  (1.88 m), weight 67.4 kg, SpO2 99 %.    Vent Mode: PRVC FiO2 (%):  [60 %-100 %] 75 % Set Rate:  [15 bmp] 15 bmp Vt Set:  [570 mL-660 mL] 570 mL PEEP:  [5 cmH20] 5 cmH20 Plateau Pressure:  [15  cmH20-24 cmH20] 15 cmH20   Intake/Output Summary (Last 24 hours) at 11/07/2020 0836 Last data filed at 11/07/2020 0800 Gross per 24 hour  Intake 578.05 ml  Output 2680 ml  Net -2101.95 ml   Filed Weights   11/04/20 1916 11/06/20 0500 11/07/20 0624  Weight: 66 kg 68.9 kg 67.4 kg    Examination: General this is a disheveled 45 year old white male he is currently sedated Intubated requiring mechanical ventilation HEENT: Pupils equal reactive orally intubated no JVD mucous membranes are moist Pulmonary: Currently on full ventilatory support.  No accessory use.  Weaning FiO2 post intubation from earlier today Cardiac: Regular rate rhythm, bradycardic.  Has new bundle branch block.  Sternal dressing is intact, chest tubes with decreasing sanguinous output Abdomen soft nontender positive bowel sounds orogastric tube currently in place GU clear yellow urine has Foley catheter Neuro sedated   Resolved Hospital Problem list     Assessment & Plan:   Acute hypoxemic respiratory failure.  Etiology likely multifactorial, mucous plugging, and atelectasis, possible aspiration. Portable chest x-ray personally reviewed endotracheal tube is in satisfactory position.  Surgical clips noted.  Patient film shows platelike atelectasis left base -We are actively weaning FiO2  plan Continue full ventilator support weaning FiO2 Pulse oximetry Respiratory culture VAP bundle A.m. chest x-ray PAD protocol RASS goal as below  Agitated delirium/acute metabolic encephalopathy superimposed on acute right MCA stroke due to aortic dissection.  Appears to be further complicated by history of untreated anxiety disorder ->not sure how much of this is stroke vs possible substance wd Plan Currently  on fentanyl and Dex, now bradycardic  Adding low dose clonazepam  His QTc is prolonged. Will see how he does on current rx. Would like to add Seroquel but not w/ prolonged qtc Might consider low dose Depakote  Cont  asa  Additional recs per stroke service   Aortic dissection type A with extension into R carotid artery. S/p surgical repair  Plan CTs to be removed today  Card surg signing off  BP goal 120-240  Bradycardia and new LBBB> the BBB is not embolic. Is is 2/2 to recent dissection per Dr Barry Dienes. No indication for [facility] -suspect the bradycardia also exacerbated by precedex.  Plan Cont tele Cont asa No BB for now given BP 90s  Fluid and electrolyte imbalance: hypokalemia  Plan Replace and recheck  ABLA: surgical. Improving Plan Cont to trend  Transfuse for hgb < 7  Mild thrombocytopenia Has been holding around 90K Plan Trend   Best Practice (right click and "Reselect all SmartList Selections" daily)   Diet/type: tubefeeds DVT prophylaxis: LMWH GI prophylaxis: PPI Lines: N/A Foley:  Yes, and it is still needed Code Status:  full code Last date of multidisciplinary goals of care discussion [ ]  Wife updated at bedside  My ccm time 34 minutes  ACNP-BC [facility] Pulmonary/Critical Care Pager # [phone] OR # [phone] if no answer

## 2020-11-07 NOTE — Progress Notes (Signed)
SLP Cancellation Note  Patient Details Name: Jesse [name] MRN: [id number] DOB: [birth year]   Cancelled treatment:       Reason Eval/Treat Not Completed: Patient not medically ready (intubated on previous date). Will f/u as able.     Mahala Menghini., M.A. CCC-SLP Acute Rehabilitation Services Pager [phone] Office [phone]  11/07/2020, 7:36 AM

## 2020-11-07 NOTE — Progress Notes (Addendum)
Initial Nutrition Assessment  DOCUMENTATION CODES:   Severe malnutrition in context of chronic illness  INTERVENTION:   Initiate tube feeding via Cortrak tube (after placement): Vital 1.5 at 35 ml/h and increase by 10 ml every 8 hours to goal rate of 55 ml/hr  (1320 ml per day) Prosource TF 45 ml TID  Provides 2100 kcal, 122 gm protein, 1003 ml free water daily  MVI with minerals daily; per tube  Monitor magnesium and phosphorus every 12 hours x 4 occurances, MD to replete as needed, as pt is at risk for refeeding syndrome given severe malnutrition.   NUTRITION DIAGNOSIS:   Severe Malnutrition related to chronic illness (HTN, anxiety, ETOH use) as evidenced by severe muscle depletion, severe fat depletion.  GOAL:   Patient will meet greater than or equal to 90% of their needs  MONITOR:   TF tolerance, Labs  REASON FOR ASSESSMENT:   Consult, Ventilator Enteral/tube feeding initiation and management  ASSESSMENT:   Pt with PMH of anxiety, depression, HTN, ETOH use, and tobacco abuse admitted 7/12 with R MCA stroke due to aortic dissection resulting in loss of flow to R carotid.    Patient is currently intubated on ventilator support. Spoke with pt's RN who is at bedside. No family present. Per RN possible self treatment with ETOH/xanex use due to anxiety/depression.  7/12 - s/p emergent replacement of ascending aorta and proximal transverse aortic arch, Aorta to right common carotid bypass, and aorta to right subclavian bypass 7/14 - increased agitation became hypoxic required intubation, new L BBB.    Medications reviewed and include: dulcolax, klonopin, colace, lasix, SSI, protonix, miralax  Precedex Fentanyl  Labs reviewed: K+ 3.4 Troponin: 4604  Y Chest tube: 230 ml - to be removed today 18 F OG tube: side port distal to GE junction - plan to switch out to Cortrak tube today  NUTRITION - FOCUSED PHYSICAL EXAM:  Flowsheet Row Most Recent Value  Orbital  Region Severe depletion  Upper Arm Region Severe depletion  Thoracic and Lumbar Region Severe depletion  Buccal Region Severe depletion  Temple Region Severe depletion  Clavicle Bone Region Severe depletion  Clavicle and Acromion Bone Region Severe depletion  Scapular Bone Region Unable to assess  Dorsal Hand Unable to assess  Patellar Region Severe depletion  Anterior Thigh Region Severe depletion  Posterior Calf Region Moderate depletion  Edema (RD Assessment) None  Hair Reviewed  Eyes Unable to assess  Mouth Unable to assess  Skin Reviewed  Nails Unable to assess       Diet Order:   Diet Order             Diet NPO time specified  Diet effective now                   EDUCATION NEEDS:   No education needs have been identified at this time  Skin:  Skin Assessment:  (incisions)  Last BM:  7/14  Height:   Ht Readings from Last 1 Encounters:  11/07/20 6\' 2"  (1.88 m)    Weight:   Wt Readings from Last 1 Encounters:  11/07/20 67.4 kg    Ideal Body Weight:     BMI:  Body mass index is 19.08 kg/m.  Estimated Nutritional Needs:   Kcal:  2100-2300  Protein:  115-125 grams  Fluid:  >2 L/day  11/09/20., RD, LDN, CNSC See AMiON for contact information

## 2020-11-08 ENCOUNTER — Inpatient Hospital Stay (HOSPITAL_COMMUNITY): Payer: Medicaid Other

## 2020-11-08 DIAGNOSIS — G9340 Encephalopathy, unspecified: Secondary | ICD-10-CM

## 2020-11-08 LAB — TYPE AND SCREEN
ABO/RH(D): O POS
Antibody Screen: NEGATIVE
Unit division: 0
Unit division: 0
Unit division: 0
Unit division: 0

## 2020-11-08 LAB — BPAM RBC
Blood Product Expiration Date: 202208112359
Blood Product Expiration Date: 202208112359
Blood Product Expiration Date: 202208122359
Blood Product Expiration Date: 202208122359
ISSUE DATE / TIME: 202207122041
ISSUE DATE / TIME: 202207122041
ISSUE DATE / TIME: 202207122041
ISSUE DATE / TIME: 202207122041
Unit Type and Rh: 5100
Unit Type and Rh: 5100
Unit Type and Rh: 5100
Unit Type and Rh: 5100

## 2020-11-08 LAB — GLUCOSE, CAPILLARY
Glucose-Capillary: 100 mg/dL — ABNORMAL HIGH (ref 70–99)
Glucose-Capillary: 114 mg/dL — ABNORMAL HIGH (ref 70–99)
Glucose-Capillary: 119 mg/dL — ABNORMAL HIGH (ref 70–99)
Glucose-Capillary: 134 mg/dL — ABNORMAL HIGH (ref 70–99)
Glucose-Capillary: 86 mg/dL (ref 70–99)
Glucose-Capillary: 99 mg/dL (ref 70–99)
Glucose-Capillary: 99 mg/dL (ref 70–99)

## 2020-11-08 LAB — BASIC METABOLIC PANEL
Anion gap: 5 (ref 5–15)
BUN: 25 mg/dL — ABNORMAL HIGH (ref 6–20)
CO2: 29 mmol/L (ref 22–32)
Calcium: 7.8 mg/dL — ABNORMAL LOW (ref 8.9–10.3)
Chloride: 102 mmol/L (ref 98–111)
Creatinine, Ser: 1.11 mg/dL (ref 0.61–1.24)
GFR, Estimated: >60 mL/min (ref >60)
Glucose, Bld: 112 mg/dL — ABNORMAL HIGH (ref 70–99)
Potassium: 3.2 mmol/L — ABNORMAL LOW (ref 3.5–5.1)
Sodium: 136 mmol/L (ref 135–145)

## 2020-11-08 LAB — POTASSIUM: Potassium: 4.6 mmol/L (ref 3.5–5.1)

## 2020-11-08 LAB — PHOSPHORUS
Phosphorus: 2.9 mg/dL (ref 2.5–4.6)
Phosphorus: 2.9 mg/dL (ref 2.5–4.6)

## 2020-11-08 LAB — MAGNESIUM
Magnesium: 2.3 mg/dL (ref 1.7–2.4)
Magnesium: 2.3 mg/dL (ref 1.7–2.4)

## 2020-11-08 MED ORDER — LORAZEPAM 2 MG/ML IJ SOLN
2.0000 mg | INTRAMUSCULAR | Status: DC | PRN
  Administered 2020-11-08 – 2020-11-10 (×5): 2 mg via INTRAVENOUS
  Filled 2020-11-08 (×6): qty 1

## 2020-11-08 MED ORDER — FUROSEMIDE 10 MG/ML IJ SOLN
INTRAMUSCULAR | Status: AC
  Filled 2020-11-08: qty 4

## 2020-11-08 MED ORDER — ORAL CARE MOUTH RINSE
15.0000 mL | Freq: Two times a day (BID) | OROMUCOSAL | Status: DC
  Administered 2020-11-08 – 2020-11-13 (×8): 15 mL via OROMUCOSAL

## 2020-11-08 MED ORDER — LABETALOL HCL 5 MG/ML IV SOLN
10.0000 mg | INTRAVENOUS | Status: DC | PRN
  Administered 2020-11-08 – 2020-11-12 (×9): 10 mg via INTRAVENOUS
  Filled 2020-11-08 (×9): qty 4

## 2020-11-08 MED ORDER — FUROSEMIDE 10 MG/ML IJ SOLN
40.0000 mg | INTRAMUSCULAR | Status: AC
  Administered 2020-11-08: 40 mg via INTRAVENOUS

## 2020-11-08 MED ORDER — POTASSIUM CHLORIDE 10 MEQ/100ML IV SOLN
10.0000 meq | INTRAVENOUS | Status: AC
  Administered 2020-11-08 (×4): 10 meq via INTRAVENOUS
  Filled 2020-11-08: qty 100

## 2020-11-08 MED ORDER — POTASSIUM CHLORIDE 20 MEQ PO PACK
20.0000 meq | PACK | ORAL | Status: AC
  Administered 2020-11-08 (×2): 20 meq
  Filled 2020-11-08 (×2): qty 1

## 2020-11-08 NOTE — Progress Notes (Signed)
I received order to extubate patient. As I entered the room patient had already extubated himself. MD at bedside & said to start him on Heated Hi Flow [place]. Placed on HHFNC & maintaining SpO2, but breathing is labored. Will continue to monitor.

## 2020-11-08 NOTE — Progress Notes (Signed)
[facility] ADULT ICU REPLACEMENT PROTOCOL   The patient does apply for the [facility] Adult ICU Electrolyte Replacment Protocol based on the criteria listed below:   1.Exclusion criteria: TCTS patients, ECMO patients and Hypothermia Protocol, and   Dialysis patients 2. Is GFR >/= 30 ml/min? Yes.    Patient's GFR today is > 60 3. Is SCr </= 2? Yes.   Patient's SCr is 1.11 mg/dL 4. Did SCr increase >/= 0.5 in 24 hours? No. 5.Pt's weight >40kg  Yes.   6. Abnormal electrolyte(s):  K 3.2  7. Electrolytes replaced per protocol 8.  Call MD STAT for K+ </= 2.5, Phos </= 1, or Mag </= 1 Physician:  Shawn Stall R Youssouf Shipley 11/08/2020 6:12 AM

## 2020-11-08 NOTE — Progress Notes (Signed)
STROKE TEAM PROGRESS NOTE   SUBJECTIVE (INTERVAL HISTORY) His RN is at the bedside.   Patient is off sedation this morning and off drips but remains sleepy and not consistently following commands with dense left hemiplegia.  Attempts to wean him off ventilatory support were not successful and is back on ventilatory support for respiratory failure.  Vital signs are stable. 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  Additional tiny subcentimeter acute infarcts are noted involving bilateral cerebral hemispheres and right cerebellum.  MR angiogram of the brain shows reestablishment of flow in the right ICA and MRI of the neck shows interbody graft repair of previous aortic dissection with aorto to right subclavian bypass.  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   OBJECTIVE Temp:  [96.8 F (36 C)-97.7 F (36.5 C)] 97.3 F (36.3 C) (07/16 1139) Pulse Rate:  [47-64] 59 (07/16 1000) Cardiac Rhythm: Sinus bradycardia;Bundle branch block (07/16 0800) Resp:  [7-15] 7 (07/16 1000) BP: (77-177)/(52-103) 102/64 (07/16 1000) SpO2:  [97 %-100 %] 100 % (07/16 1140) FiO2 (%):  [40 %] 40 % (07/16 1140) Weight:  [64.3 kg] 64.3 kg (07/16 0600)  Recent Labs  Lab 11/07/20 2018 11/08/20 0011 11/08/20 0341 11/08/20 0753 11/08/20 1126  GLUCAP 88 119* 99 134* 100*   Recent Labs  Lab 11/05/20 1208 11/05/20 1305 11/06/20 0510 11/06/20 0615 11/06/20 2003 11/06/20 2029 11/07/20 0031 11/07/20 0422 11/07/20 0624 11/07/20 1350 11/07/20 2111 11/08/20 0443  NA 135   < > 136  --  134* 137 136 137 136  --   --  136  K 3.6   < > >7.5*   < > 3.4* 3.3* 3.4* 3.1* 3.4*  --  3.5 3.2*  CL 103  --  106  --  96*  --  100  --   --   --   --  102  CO2 24  --  26  --  28  --  30  --   --   --   --   29  GLUCOSE 147*  --  116*  --  125*  --  112*  --   --   --   --  112*  BUN 13  --  11  --  15  --  17  --   --   --   --  25*  CREATININE 1.18  --  1.11  --  1.03  --  1.20  --   --   --   --  1.11  CALCIUM 7.7*  --  6.9*  --  8.0*  --  7.8*  --   --   --   --  7.8*  MG 2.5*  --  1.8  --  2.1  --  1.9  --   --  2.3 2.2 2.3  PHOS  --   --   --   --  3.5  --   --   --   --  3.3 3.3 2.9   < > = values in this interval not displayed.   Recent Labs  Lab 11/04/20 1837 11/07/20 0031  AST 20 40  ALT 15 12  ALKPHOS 55 41  BILITOT 0.7 0.4  PROT 6.3* 4.8*  ALBUMIN 3.7 2.5*   Recent Labs  Lab 11/04/20 1837 11/04/20  1840 11/05/20 0920 11/05/20 1110 11/05/20 1208 11/05/20 1305 11/06/20 0510 11/06/20 2003 11/06/20 2029 11/07/20 0031 11/07/20 0422 11/07/20 0624  WBC 12.7*   < > 6.8  --  9.6  --  8.2 7.8  --  7.9  --   --   NEUTROABS 10.8*  --   --   --   --   --   --   --   --   --   --   --   HGB 14.5   < > 9.6*   < > 9.8*   < > 8.7* 9.7* 8.8* 8.8* 7.8* 7.8*  HCT 42.4   < > 27.9*   < > 29.4*   < > 26.0* 29.1* 26.0* 25.9* 23.0* 23.0*  MCV 97.2   < > 98.2  --  99.3  --  102.4* 98.6  --  100.0  --   --   PLT 170   < > 107*  --  121*  --  90* 94*  --  84*  --   --    < > = values in this interval not displayed.   No results for input(s): CKTOTAL, CKMB, CKMBINDEX, TROPONINI in the last 168 hours. No results for input(s): LABPROT, INR in the last 72 hours.  No results for input(s): COLORURINE, LABSPEC, PHURINE, GLUCOSEU, HGBUR, BILIRUBINUR, KETONESUR, PROTEINUR, UROBILINOGEN, NITRITE, LEUKOCYTESUR in the last 72 hours.  Invalid input(s): APPERANCEUR     Component Value Date/Time   CHOL 112 11/06/2020 0510   TRIG 196 (H) 11/06/2020 0510   HDL 24 (L) 11/06/2020 0510   CHOLHDL 4.7 11/06/2020 0510   VLDL 39 11/06/2020 0510   LDLCALC 49 11/06/2020 0510   Lab Results  Component Value Date   HGBA1C 5.2 11/06/2020   No results found for: LABOPIA, COCAINSCRNUR, LABBENZ, AMPHETMU,  THCU, LABBARB  Recent Labs  Lab 11/04/20 1837  ETH <10    I have personally reviewed the radiological images below and agree with the radiology interpretations.  DG Chest 1 View  Result Date: 11/07/2020 CLINICAL DATA:  45 year old male with code stroke. Attempted to pull out endotracheal tube. EXAM: CHEST  1 VIEW COMPARISON:  Chest x-ray 11/06/2020. FINDINGS: An endotracheal tube is in place with tip 8.9 cm above the carina. A nasogastric tube is seen extending into the stomach, however, the tip of the nasogastric tube extends below the lower margin of the image. There is a surgical drain projecting over the medial aspect of the right hemithorax, either a mediastinal drain or medially placed right-sided chest tube. Lung volumes are normal. Opacity in the left lower lobe, favored to represent postoperative atelectasis. Small left pleural effusion. Right lung is clear. No right pleural effusion. No appreciable pneumothorax. No evidence of pulmonary edema. Heart size is normal. Mediastinal contours are within normal limits. Surgical clips are noted to the right-side of the thoracic inlet. IMPRESSION: 1. Support apparatus, as above. 2. Probable atelectasis in the left lower lobe. 3. Small left pleural effusion. Electronically Signed   By: Trudie Reed M.D.   On: 11/07/2020 06:31   CT HEAD WO CONTRAST  Result Date: 11/06/2020 CLINICAL DATA:  45 year old male code stroke presentation. Type a aortic dissection. Right carotid involvement and occlusion, distal right M2 occlusion. Subsequent encounter. EXAM: CT HEAD WITHOUT CONTRAST TECHNIQUE: Contiguous axial images were obtained from the base of the skull through the vertex without intravenous contrast. COMPARISON:  CTA head and neck, and CT headi 11/04/2020 FINDINGS: Brain: Confluent cytotoxic  edema throughout a roughly 8 cm area of the posterior right hemisphere involving the posterior right MCA and right MCA/PCA watershed area. See sagittal image 15.  Mild regional mass effect. Mild mass effect on the right lateral ventricle and trace leftward midline shift (series 3, image 23). No hemorrhagic transformation. Basilar cisterns remain patent. No ventriculomegaly. Deep gray nuclei appear stable. Left hemisphere and posterior fossa gray-white matter differentiation is stable. Vascular: No large vessel hyperdensity. Skull: Negative. Sinuses/Orbits: Visualized paranasal sinuses and mastoids are stable and well aerated. Other: Visualized orbits and scalp soft tissues are within normal limits. IMPRESSION: 1. Relatively large infarct with cytotoxic edema in the posterior Right MCA and MCA/PCA watershed territories. Mild mass effect including trace leftward midline shift. No hemorrhagic transformation. 2. Preserved gray-white matter differentiation elsewhere. Electronically Signed   By: Odessa Fleming M.D.   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 Status post graft repair. EXAM: MRI HEAD WITHOUT CONTRAST MRA HEAD WITHOUT CONTRAST MRA NECK WITHOUT AND WITH CONTRAST TECHNIQUE: Multiplanar, multi-echo pulse sequences of the brain and surrounding structures were acquired without intravenous contrast. Angiographic images of the Circle of Willis were acquired using MRA technique without intravenous contrast. Angiographic images of the neck were acquired using MRA technique without and with intravenous contrast. Carotid stenosis measurements (when applicable) are obtained utilizing NASCET criteria, using the distal internal carotid diameter as the denominator. CONTRAST:  6.37mL GADAVIST GADOBUTROL 1 MMOL/ML IV SOLN COMPARISON:  Prior CTs from 11/06/2020 and 11/04/2020 FINDINGS: MRI HEAD FINDINGS Brain: Cerebral volume within normal limits. Few scattered subcentimeter foci  of T2/FLAIR hyperintensity noted involving the periventricular and deep white matter both cerebral hemispheres, consistent with chronic microvascular ischemic disease, mild in nature. Moderately large area of restricted diffusion involving the right temporal occipital and parietal region, consistent with right MCA and MCA/PCA watershed infarct, relatively stable from prior CT. Associated gyral swelling and edema with regional mass effect. Associated right-to-left shift now measures 5 mm, slightly increased from prior. No associated hemorrhage. Multiple additional scattered subcentimeter acute ischemic infarcts noted involving the bilateral cerebral hemispheres, embolic in nature. Largest of these additional foci seen at the high posterior right parietal region and measures 8 mm (series 5, image 94). Patchy involvement of the basal ganglia/corona radiata noted. Few additional punctate ischemic infarcts noted involving the right cerebellum. No associated hemorrhage or mass effect about these additional infarcts. Gray-white matter differentiation otherwise maintained. No other areas of chronic cortical infarction. No acute intracranial hemorrhage. Few scattered punctate chronic micro hemorrhages noted involving both cerebral hemispheres, likely related to underlying hypertension. No mass lesion. No hydrocephalus or ventricular trapping. Basilar cisterns remain patent. No extra-axial fluid collection. Pituitary gland suprasellar region normal. Midline structures intact. Vascular: Major intracranial vascular flow voids are maintained. Skull and upper cervical spine: Craniocervical junction within normal limits. Bone marrow signal intensity normal. No scalp soft tissue abnormality. Sinuses/Orbits: Globes and orbital soft tissues within normal limits. Scattered mucosal thickening noted throughout the ethmoidal air cells and maxillary sinuses. Fluid seen within the nasopharynx with small to moderate bilateral mastoid  effusions. Patient is intubated. Other: None. MRA HEAD FINDINGS Anterior circulation: Widely patent antegrade flow seen within the distal cervical segments of both internal carotid arteries, with interval re-establishment of flow within the cervical right ICA since prior. Both ICAs remain patent  to the termini without definite stenosis. Apparent focal stenoses involving the anterior genus of both cavernous ICAs on MIP reconstructions favored to be artifactual in nature. A1 segments patent bilaterally. Normal anterior communicating artery complex. Anterior cerebral arteries patent to their distal aspects without stenosis. No M1 stenosis or occlusion. Normal MCA bifurcations. Previously seen right MCA branch occlusions not definitely seen by MRA. Right MCA branches now appear well perfused and symmetric. Posterior circulation: Both V4 segments patent to the vertebrobasilar junction without stenosis. Right vertebral artery dominant. Partially visualized PICA patent. Basilar patent to its distal aspect without stenosis. Superior cerebellar arteries patent bilaterally. Right PCA primarily supplied via the basilar. Fetal type origin of the left PCA. Both PCAs remain well perfused to their distal aspects. Anatomic variants: Fetal type origin of the left PCA.  No aneurysm. 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 The visualized native arch is normal in caliber. The native left common carotid and subclavian arteries arise directly from the native arch. There has also been interval performance of an aorta to right subclavian bypass that supplies the right common and subclavian arteries. No visible stenosis or adverse features. Right carotid system: Right CCA now seen arising from the right subclavian bypass. Previously seen right CCA dissection has been repaired, with the right CCA now seen widely patent to the  bifurcation. No significant atheromatous narrowing or irregularity about the right carotid bulb. Right ICA now patent and well perfused to the skull base without stenosis or occlusion. No MRA evidence for residual dissection. Left carotid system: Left CCA arises from the repaired native aortic arch and is widely patent to the bifurcation. No significant narrowing about the left carotid bulb. Left ICA patent distally to the skull base without stenosis, evidence for dissection or occlusion. Vertebral arteries: Left vertebral artery arises from the proximal left subclavian artery, just beyond its takeoff from the native aortic arch. Right vertebral artery arises from the right subclavian bypass. 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 Vertebral arteries themselves are patent within the neck without stenosis, evidence for dissection, or occlusion. Right vertebral artery dominant. Other: None. IMPRESSION: MRI HEAD IMPRESSION: 1. 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 Associated regional mass effect with 5 mm of right-to-left shift, slightly increased from prior. 2. Additional scattered subcentimeter acute ischemic infarcts involving the bilateral cerebral hemispheres and right cerebellum, embolic in nature. No associated hemorrhage or mass effect. 3. No other acute intracranial abnormality. MRA HEAD IMPRESSION: 1. Interval re-establishment of flow within the right ICA, now patent to the terminus. Right MCA well perfused, with no visible persistent downstream occlusion evident by MRA. 2. Otherwise stable and unremarkable intracranial MRA. MRA NECK IMPRESSION: 1. Interval graft repair of previously seen aortic dissection with performance of an aorta to right subclavian  bypass. 2. Right carotid artery system supplied via the right subclavian bypass, and is now widely patent without residual dissection or other abnormality. 3. Left carotid artery system supplied via the native aortic arch, and remains widely patent without stenosis or other abnormality. 4. 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 Follow-up examination with dedicated CTA could be performed for further evaluation of  this finding as warranted. 5. Both vertebral arteries remain widely patent within the neck. Right vertebral artery dominant. Electronically Signed   By: Rise Mu M.D.   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 Status post graft repair. EXAM: MRI HEAD WITHOUT CONTRAST MRA HEAD WITHOUT CONTRAST MRA NECK WITHOUT AND WITH CONTRAST TECHNIQUE: Multiplanar, multi-echo pulse sequences of the brain and surrounding structures were acquired without intravenous contrast. Angiographic images of the Circle of Willis were acquired using MRA technique without intravenous contrast. Angiographic images of the neck were acquired using MRA technique without and with intravenous contrast. Carotid stenosis measurements (when applicable) are obtained utilizing NASCET criteria, using the distal internal carotid diameter as the denominator. CONTRAST:  6.38mL GADAVIST GADOBUTROL 1 MMOL/ML IV SOLN COMPARISON:  Prior CTs from 11/06/2020 and 11/04/2020 FINDINGS: MRI HEAD FINDINGS Brain: Cerebral volume within normal limits. Few scattered subcentimeter foci of T2/FLAIR hyperintensity noted involving the periventricular and deep white matter both cerebral hemispheres, consistent with chronic microvascular ischemic  disease, mild in nature. Moderately large area of restricted diffusion involving the right temporal occipital and parietal region, consistent with right MCA and MCA/PCA watershed infarct, relatively stable from prior CT. Associated gyral swelling and edema with regional mass effect. Associated right-to-left shift now measures 5 mm, slightly increased from prior. No associated hemorrhage. Multiple additional scattered subcentimeter acute ischemic infarcts noted involving the bilateral cerebral hemispheres, embolic in nature. Largest of these additional foci seen at the high posterior right parietal region and measures 8 mm (series 5, image 94). Patchy involvement of the basal ganglia/corona radiata noted. Few additional punctate ischemic infarcts noted involving the right cerebellum. No associated hemorrhage or mass effect about these additional infarcts. Gray-white matter differentiation otherwise maintained. No other areas of chronic cortical infarction. No acute intracranial hemorrhage. Few scattered punctate chronic micro hemorrhages noted involving both cerebral hemispheres, likely related to underlying hypertension. No mass lesion. No hydrocephalus or ventricular trapping. Basilar cisterns remain patent. No extra-axial fluid collection. Pituitary gland suprasellar region normal. Midline structures intact. Vascular: Major intracranial vascular flow voids are maintained. Skull and upper cervical spine: Craniocervical junction within normal limits. Bone marrow signal intensity normal. No scalp soft tissue abnormality. Sinuses/Orbits: Globes and orbital soft tissues within normal limits. Scattered mucosal thickening noted throughout the ethmoidal air cells and maxillary sinuses. Fluid seen within the nasopharynx with small to moderate bilateral mastoid effusions. Patient is intubated. Other: None. MRA HEAD FINDINGS Anterior circulation: Widely patent antegrade flow seen within the distal cervical segments of both  internal carotid arteries, with interval re-establishment of flow within the cervical right ICA since prior. Both ICAs remain patent to the termini without definite stenosis. Apparent focal stenoses involving the anterior genus of both cavernous ICAs on MIP reconstructions favored to be artifactual in nature. A1 segments patent bilaterally. Normal anterior communicating artery complex. Anterior cerebral arteries patent to their distal aspects without stenosis. No M1 stenosis or occlusion. Normal MCA bifurcations. Previously seen right MCA branch occlusions not definitely seen by MRA. Right MCA branches now appear well perfused and symmetric. Posterior circulation: Both V4 segments patent to the vertebrobasilar junction without stenosis. Right vertebral artery dominant. Partially visualized PICA patent. Basilar patent to its distal aspect without stenosis. Superior cerebellar arteries patent bilaterally. Right PCA primarily supplied via the basilar. Fetal type origin  of the left PCA. Both PCAs remain well perfused to their distal aspects. Anatomic variants: Fetal type origin of the left PCA.  No aneurysm. 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 The visualized native arch is normal in caliber. The native left common carotid and subclavian arteries arise directly from the native arch. There has also been interval performance of an aorta to right subclavian bypass that supplies the right common and subclavian arteries. No visible stenosis or adverse features. Right carotid system: Right CCA now seen arising from the right subclavian bypass. Previously seen right CCA dissection has been repaired, with the right CCA now seen widely patent to the bifurcation. No significant atheromatous narrowing or irregularity about the right carotid bulb. Right ICA now patent and well perfused to the skull base without stenosis or  occlusion. No MRA evidence for residual dissection. Left carotid system: Left CCA arises from the repaired native aortic arch and is widely patent to the bifurcation. No significant narrowing about the left carotid bulb. Left ICA patent distally to the skull base without stenosis, evidence for dissection or occlusion. Vertebral arteries: Left vertebral artery arises from the proximal left subclavian artery, just beyond its takeoff from the native aortic arch. Right vertebral artery arises from the right subclavian bypass. 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 Vertebral arteries themselves are patent within the neck without stenosis, evidence for dissection, or occlusion. Right vertebral artery dominant. Other: None. IMPRESSION: MRI HEAD IMPRESSION: 1. 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 Associated regional mass effect with 5 mm of right-to-left shift, slightly increased from prior. 2. Additional scattered subcentimeter acute ischemic infarcts involving the bilateral cerebral hemispheres and right cerebellum, embolic in nature. No associated hemorrhage or mass effect. 3. No other acute intracranial abnormality. MRA HEAD IMPRESSION: 1. Interval re-establishment of flow within the right ICA, now patent to the terminus. Right MCA well perfused, with no visible persistent downstream occlusion evident by MRA. 2. Otherwise stable and unremarkable intracranial MRA. MRA NECK IMPRESSION: 1. 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 2. Right carotid artery system supplied via the right subclavian bypass, and is now widely patent without residual dissection or other abnormality. 3. Left carotid artery  system supplied via the native aortic arch, and remains widely patent without stenosis or other abnormality. 4. 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 Follow-up examination with dedicated CTA could be performed for further evaluation of this finding as warranted. 5. Both vertebral arteries remain widely patent within the neck. Right vertebral artery dominant. Electronically Signed   By: Rise Mu M.D.   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 Status post graft repair. EXAM: MRI HEAD WITHOUT CONTRAST MRA HEAD WITHOUT CONTRAST MRA NECK WITHOUT AND WITH CONTRAST TECHNIQUE: Multiplanar, multi-echo pulse sequences of the brain and surrounding structures were acquired without intravenous contrast. Angiographic images of the Circle of Willis were acquired using MRA technique  without intravenous contrast. Angiographic images of the neck were acquired using MRA technique without and with intravenous contrast. Carotid stenosis measurements (when applicable) are obtained utilizing NASCET criteria, using the distal internal carotid diameter as the denominator. CONTRAST:  6.45mL GADAVIST GADOBUTROL 1 MMOL/ML IV SOLN COMPARISON:  Prior CTs from 11/06/2020 and 11/04/2020 FINDINGS: MRI HEAD FINDINGS Brain: Cerebral volume within normal limits. Few scattered subcentimeter foci of T2/FLAIR hyperintensity noted involving the periventricular and deep white matter both cerebral hemispheres, consistent with chronic microvascular ischemic disease, mild in nature. Moderately large area of restricted diffusion involving the right temporal occipital and parietal region, consistent with right MCA and MCA/PCA watershed  infarct, relatively stable from prior CT. Associated gyral swelling and edema with regional mass effect. Associated right-to-left shift now measures 5 mm, slightly increased from prior. No associated hemorrhage. Multiple additional scattered subcentimeter acute ischemic infarcts noted involving the bilateral cerebral hemispheres, embolic in nature. Largest of these additional foci seen at the high posterior right parietal region and measures 8 mm (series 5, image 94). Patchy involvement of the basal ganglia/corona radiata noted. Few additional punctate ischemic infarcts noted involving the right cerebellum. No associated hemorrhage or mass effect about these additional infarcts. Gray-white matter differentiation otherwise maintained. No other areas of chronic cortical infarction. No acute intracranial hemorrhage. Few scattered punctate chronic micro hemorrhages noted involving both cerebral hemispheres, likely related to underlying hypertension. No mass lesion. No hydrocephalus or ventricular trapping. Basilar cisterns remain patent. No extra-axial fluid collection. Pituitary gland suprasellar region normal. Midline structures intact. Vascular: Major intracranial vascular flow voids are maintained. Skull and upper cervical spine: Craniocervical junction within normal limits. Bone marrow signal intensity normal. No scalp soft tissue abnormality. Sinuses/Orbits: Globes and orbital soft tissues within normal limits. Scattered mucosal thickening noted throughout the ethmoidal air cells and maxillary sinuses. Fluid seen within the nasopharynx with small to moderate bilateral mastoid effusions. Patient is intubated. Other: None. MRA HEAD FINDINGS Anterior circulation: Widely patent antegrade flow seen within the distal cervical segments of both internal carotid arteries, with interval re-establishment of flow within the cervical right ICA since prior. Both ICAs remain patent to the termini without definite stenosis.  Apparent focal stenoses involving the anterior genus of both cavernous ICAs on MIP reconstructions favored to be artifactual in nature. A1 segments patent bilaterally. Normal anterior communicating artery complex. Anterior cerebral arteries patent to their distal aspects without stenosis. No M1 stenosis or occlusion. Normal MCA bifurcations. Previously seen right MCA branch occlusions not definitely seen by MRA. Right MCA branches now appear well perfused and symmetric. Posterior circulation: Both V4 segments patent to the vertebrobasilar junction without stenosis. Right vertebral artery dominant. Partially visualized PICA patent. Basilar patent to its distal aspect without stenosis. Superior cerebellar arteries patent bilaterally. Right PCA primarily supplied via the basilar. Fetal type origin of the left PCA. Both PCAs remain well perfused to their distal aspects. Anatomic variants: Fetal type origin of the left PCA.  No aneurysm. 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 The visualized native arch is normal in caliber. The native left common carotid and subclavian arteries arise directly from the native arch. There has also been interval performance of an aorta to right subclavian bypass that supplies the right common and subclavian arteries. No visible stenosis or adverse features. Right carotid system: Right CCA now seen arising from the right subclavian bypass. Previously seen right CCA dissection has been repaired, with the right  CCA now seen widely patent to the bifurcation. No significant atheromatous narrowing or irregularity about the right carotid bulb. Right ICA now patent and well perfused to the skull base without stenosis or occlusion. No MRA evidence for residual dissection. Left carotid system: Left CCA arises from the repaired native aortic arch and is widely patent to the bifurcation. No  significant narrowing about the left carotid bulb. Left ICA patent distally to the skull base without stenosis, evidence for dissection or occlusion. Vertebral arteries: Left vertebral artery arises from the proximal left subclavian artery, just beyond its takeoff from the native aortic arch. Right vertebral artery arises from the right subclavian bypass. 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 Vertebral arteries themselves are patent within the neck without stenosis, evidence for dissection, or occlusion. Right vertebral artery dominant. Other: None. IMPRESSION: MRI HEAD IMPRESSION: 1. 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 Associated regional mass effect with 5 mm of right-to-left shift, slightly increased from prior. 2. Additional scattered subcentimeter acute ischemic infarcts involving the bilateral cerebral hemispheres and right cerebellum, embolic in nature. No associated hemorrhage or mass effect. 3. No other acute intracranial abnormality. MRA HEAD IMPRESSION: 1. Interval re-establishment of flow within the right ICA, now patent to the terminus. Right MCA well perfused, with no visible persistent downstream occlusion evident by MRA. 2. Otherwise stable and unremarkable intracranial MRA. MRA NECK IMPRESSION: 1. 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 2. Right carotid artery system supplied via the right subclavian bypass, and is now widely patent without residual dissection or other abnormality. 3. Left carotid artery system supplied via the native aortic arch, and remains widely patent without stenosis or other abnormality. 4. 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 Follow-up examination with dedicated CTA could be performed for further evaluation of this finding as warranted. 5. Both vertebral arteries remain widely patent within the neck. Right vertebral artery dominant. Electronically Signed   By: Rise Mu M.D.   On: 11/07/2020 20:51   DG CHEST PORT 1 VIEW  Result Date: 11/06/2020 CLINICAL DATA:  44 year old male status post intubation. EXAM: PORTABLE CHEST 1 VIEW COMPARISON:  Chest radiograph dated 11/06/2020. FINDINGS: Endotracheal tube with tip approximately 5.5 cm above the carina. Interval removal of the right IJ catheter. Two inferiorly accessed mediastinal drains again noted. Probable small left pleural effusion. Right infrahilar hazy density, possibly atelectasis. No obvious pneumothorax. Stable cardiomegaly. No acute osseous pathology. IMPRESSION: 1. Interval removal of the right IJ catheter. 2. Endotracheal tube above the carina. 3. Probable small left pleural effusion. Electronically Signed   By: Elgie Collard M.D.   On: 11/06/2020 20:42   DG Chest Port 1 View  Addendum Date: 11/06/2020   ADDENDUM REPORT: 11/06/2020 09:13 ADDENDUM: Also with slight increased retrocardiac opacification and RIGHT basilar opacification since prior study. These findings and findings in the initial report were discussed with the provider as outlined below. These results were called by telephone at the time of interpretation on 11/06/2020 at 9:13 am to provider Tressie Stalker , who verbally acknowledged these results. Electronically Signed   By: Donzetta Kohut M.D.   On: 11/06/2020 09:13   Result Date: 11/06/2020  CLINICAL DATA:  Post aortic dissection repair in a 45 year old male. EXAM: PORTABLE CHEST 1 VIEW COMPARISON:  November 05, 2020. FINDINGS: EKG leads project over the chest. Since the previous study the patient has been extubated. Chest support tubes project over the heart  terminating just below the clavicular heads with respect to the more rightward oriented tube, not changed in position. The LEFT-sided tube terminates over the LEFT chest in the retrocardiac region with similar appearance. RIGHT IJ sheath in similar position Swan-Ganz catheter now removed. Persistent tiny RIGHT apical pneumothorax, unchanged. Trachea midline. Cardiomediastinal contours and hilar structures are stable. Graded opacity at the RIGHT lung base is subtle with obscured LEFT hemidiaphragm along the medial aspect when compared to the prior study. Possible deep sulcus sign on the LEFT otherwise no evidence of LEFT-sided pneumothorax. On limited assessment no acute skeletal process. IMPRESSION: 1. Persistent tiny RIGHT apical pneumothorax. 2. Possible deep sulcus sign on the LEFT potentially a very small LEFT pneumothorax. Attention on follow-up. 3. Interval extubation, removal of gastric tube and removal of Swan-Ganz catheter, otherwise stable position of support apparatus. A call is out to the referring provider to further discuss findings in the above case. Electronically Signed: By: Donzetta Kohut M.D. On: 11/06/2020 09:05   DG Chest Port 1 View  Result Date: 11/05/2020 CLINICAL DATA:  45 year old male with open heart surgery. EXAM: PORTABLE CHEST 1 VIEW COMPARISON:  Chest CT dated 11/04/2020. FINDINGS: Right IJ Swan-Ganz catheter with tip likely close to the bifurcation of the main pulmonary trunk. Endotracheal tube with tip approximately 6.5 cm above the carina. Enteric tube extends below the diaphragm with tip beyond the inferior margin of the image. Inferiorly accessed mediastinal drain. The lungs are clear. There is no pleural effusion. Tiny linear lucency in the right apex may be artifactual or represent minimal right pneumothorax measuring approximately 4 mm in thickness. Close follow-up recommended. The cardiac silhouette is within limits. No acute osseous pathology. IMPRESSION: 1. Status post  open heart surgery with support apparatus as above. 2. Possible tiny right apical pneumothorax. Attention on follow-up imaging recommended. These results were called by telephone at the time of interpretation on 11/05/2020 at 4:03 am to the patient's nurse Jess Barters, who verbally acknowledged these results. Electronically Signed   By: Elgie Collard M.D.   On: 11/05/2020 04:07   DG Abd Portable 1V  Result Date: 11/07/2020 CLINICAL DATA:  Feeding tube placement EXAM: PORTABLE ABDOMEN - 1 VIEW COMPARISON:  11/07/2020 at 0310 hours FINDINGS: Interval placement of large bore feeding tube which extends below the diaphragm, distal tip beyond the inferior margin of the film. Additional enteric tube is again seen terminating within the gastric body. Mediastinal drain and left basilar chest tube are seen. IMPRESSION: Interval placement of feeding tube which extends below the diaphragm into the stomach with distal tip beyond the inferior margin of the film. Electronically Signed   By: Duanne Guess D.O.   On: 11/07/2020 12:33   DG Abd Portable 1V  Result Date: 11/07/2020 CLINICAL DATA:  45 year male status post feeding tube placement. EXAM: PORTABLE ABDOMEN - 1 VIEW COMPARISON:  Chest radiograph dated 11/06/2020 and CT of the chest abdomen pelvis dated 11/04/2020. FINDINGS: Partially visualized enteric tube with side-port just distal to the GE junction and tip in the proximal body of the stomach. Inferiorly accessed mediastinal drains noted. No bowel dilatation or evidence of obstruction. No free air. Probable small left pleural effusion. IMPRESSION: Enteric tube with tip in the proximal body of  the stomach. Electronically Signed   By: Elgie Collard M.D.   On: 11/07/2020 03:35   ECHO INTRAOPERATIVE TEE  Result Date: 11/05/2020  *INTRAOPERATIVE TRANSESOPHAGEAL REPORT *  Patient Name:   Jesse [name] Date of Exam: 11/04/2020 Medical Rec #:  [id number]     Height: Accession #:    [id number]    Weight:        145.5 lb Date of Birth:  [birth year]    BSA:          1.63 m Patient Age:    44 years      BP:           163/100 mmHg Patient Gender: M             HR:           70 bpm. Exam Location:  Inpatient Transesophogeal exam was perform intraoperatively during surgical procedure. Patient was closely monitored under general anesthesia during the entirety of examination. Indications:     Aortic dissection Performing Phys: [id number] CLARENCE H OWEN Diagnosing Phys: Lewie Loron MD Complications: No known complications during this procedure. POST-OP IMPRESSIONS - Left Ventricle: The left ventricle is unchanged from pre-bypass. - Right Ventricle: The right ventricle appears unchanged from pre-bypass. - Aorta: there is no dissection present in the aorta A graft was placed in the ascending aorta for repair. - Left Atrial Appendage: The left atrial appendage appears unchanged from pre-bypass. - Aortic Valve: The aortic valve appears unchanged from pre-bypass. - Mitral Valve: The mitral valve appears unchanged from pre-bypass. - Tricuspid Valve: The tricuspid valve appears unchanged from pre-bypass. PRE-OP FINDINGS  Left Ventricle: The left ventricle has normal systolic function, with an ejection fraction of 55-60%. The cavity size was normal. There is severe concentric left ventricular hypertrophy. Right Ventricle: The right ventricle has normal systolic function. The cavity was normal. There is no increase in right ventricular wall thickness. Left Atrium: Left atrial size was normal in size. No left atrial/left atrial appendage thrombus was detected. Right Atrium: Right atrial size was normal in size. Interatrial Septum: The interatrial septum was not assessed. There is no evidence of a patent foramen ovale. Pericardium: Trivial pericardial effusion is present. Mitral Valve: The mitral valve is normal in structure. Mitral valve regurgitation is trivial by color flow Doppler. Tricuspid Valve: The tricuspid valve was normal in  structure. Tricuspid valve regurgitation is trivial by color flow Doppler. Aortic Valve: The aortic valve is tricuspid Aortic valve regurgitation is trivial by color flow Doppler. There is no stenosis of the aortic valve. Pulmonic Valve: The pulmonic valve was normal in structure. Pulmonic valve regurgitation is not visualized by color flow Doppler. Aorta: There is evidence of a dissection in the aortic root, ascending aorta and aortic arch. Shunts: There is no evidence of an atrial septal defect.  Lewie Loron MD Electronically signed by Lewie Loron MD Signature Date/Time: 11/05/2020/2:44:37 AM    Final    CT Angio Chest/Abd/Pel for Dissection W and/or W/WO  Result Date: 11/04/2020 CLINICAL DATA:  Further evaluation of aortic dissection seen on prior same day imaging EXAM: CT ANGIOGRAPHY CHEST, ABDOMEN AND PELVIS TECHNIQUE: Non-contrast CT of the chest was initially obtained. Multidetector CT imaging through the chest, abdomen and pelvis was performed using the standard protocol during bolus administration of intravenous contrast. Multiplanar reconstructed images and MIPs were obtained and reviewed to evaluate the vascular anatomy. CONTRAST:  OMNIPAQUE IOHEXOL 350 MG/ML SOLN COMPARISON:  Same day CT neck. FINDINGS: CTA CHEST FINDINGS Cardiovascular:  Ascending aortic dissection extending from superior to the sino-tubular junction to the transverse arch with continuation of the dissection into the innominate artery with thrombus in the false lumen of the innominate artery. No involvement of the descending thoracic aorta. The left main and right coronary arteries not appear to be involved and are patent as are the portions of the LAD and left circumflex. Normal size heart. No significant pericardial effusion/thickening. No central pulmonary embolus. Mediastinum/Nodes: No mediastinal fluid collection. No discrete thyroid nodule. No pathologically enlarged mediastinal, hilar or axillary lymph nodes. The  trachea and esophagus are grossly unremarkable. Lungs/Pleura: No suspicious pulmonary nodules or masses. No focal consolidation. No pleural effusion. No pneumothorax. Musculoskeletal: No acute osseous abnormality. Review of the MIP images confirms the above findings. CTA ABDOMEN AND PELVIS FINDINGS VASCULAR Aorta: Aortic atherosclerosis. Normal caliber abdominal aorta without aneurysm, dissection, vasculitis or significant stenosis. Celiac: Patent without evidence of aneurysm, dissection, vasculitis or significant stenosis. SMA: Patent without evidence of aneurysm, dissection, vasculitis or significant stenosis. Renals: Both renal arteries are patent without evidence of aneurysm, dissection, vasculitis, fibromuscular dysplasia or significant stenosis. IMA: Patent without evidence of aneurysm, dissection, vasculitis or significant stenosis. Inflow: Patent without evidence of aneurysm, dissection, vasculitis or significant stenosis. Veins: No obvious venous abnormality within the limitations of this arterial phase study. Review of the MIP images confirms the above findings. NON-VASCULAR Hepatobiliary: No suspicious hepatic lesion. Gallbladder is unremarkable. No biliary ductal dilation. Pancreas: Within normal limits. Spleen: Within normal limits. Adrenals/Urinary Tract: Adrenal glands are unremarkable. Kidneys are normal, without renal calculi, solid enhancing lesion, or hydronephrosis. Symmetric renal enhancement. Bladder is unremarkable. Stomach/Bowel: Stomach is within normal limits. Appendix appears normal. No evidence of bowel wall thickening, distention, or inflammatory changes. Lymphatic: No pathologically enlarged abdominal or pelvic lymph nodes. Reproductive: Prostate is unremarkable. Other: No abdominopelvic ascites.  No pneumoperitoneum. Musculoskeletal: Mild multilevel degenerative change of the spine. No acute osseous abnormality Review of the MIP images confirms the above findings. IMPRESSION: 1. 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 No involvement of the descending thoracic aorta. No pericardial effusion or mediastinal fluid. 2. No acute findings within the abdomen or pelvis. 3.  Aortic Atherosclerosis (ICD10-I70.0). Electronically Signed   By: Maudry Mayhew MD   On: 11/04/2020 20:22   CT HEAD CODE STROKE WO CONTRAST  Result Date: 11/04/2020 CLINICAL DATA:  Code stroke.  Aphasia EXAM: CT HEAD WITHOUT CONTRAST TECHNIQUE: Contiguous axial images were obtained from the base of the skull through the vertex without intravenous contrast. COMPARISON:  None. FINDINGS: Brain: There is no acute intracranial hemorrhage or mass effect. Question small subtle loss of gray-white differentiation in the right insular region. Ventricles and sulci are normal in size and configuration. Vascular: Question of hyperdensity along distal right M2 or proximal M3 MCA branch in the posterior sylvian fissure. Skull: Unremarkable. Sinuses/Orbits: No acute abnormality. Other: Mastoid air cells are clear. ASPECTS [facility] Stroke Program Early CT Score) - Ganglionic level infarction (caudate, lentiform nuclei, internal capsule, insula, M1-M3 cortex): 6 - Supraganglionic infarction (M4-M6 cortex): 3 Total score (0-10 with 10 being normal): 9 IMPRESSION: No acute intracranial hemorrhage. Question subtle small loss of gray-white differentiation in the right insular region (ASPECT score is 9). Possible hyperdense distal right M2 or proximal M3 MCA branch in the posterior sylvian fissure. These results were  communicated to Dr. Iver Nestle at 6:45 pm on 11/04/2020 by text page via the [facility] messaging system. Electronically Signed   By: Guadlupe Spanish M.D.   On: 11/04/2020 18:59   CT ANGIO HEAD NECK W WO CM (CODE STROKE)  Result Date:  11/04/2020 CLINICAL DATA:  Code stroke EXAM: CT ANGIOGRAPHY HEAD AND NECK TECHNIQUE: Multidetector CT imaging of the head and neck was performed using the standard protocol during bolus administration of intravenous contrast. Multiplanar CT image reconstructions and MIPs were obtained to evaluate the vascular anatomy. Carotid stenosis measurements (when applicable) are obtained utilizing NASCET criteria, using the distal internal carotid diameter as the denominator. CONTRAST:  75mL OMNIPAQUE IOHEXOL 350 MG/ML SOLN COMPARISON:  None. FINDINGS: CTA NECK Aortic arch: A dissection flap is present within the included ascending aorta extending into the transverse portion of the arch but not the descending portion. Dissection involves the innominate artery with thrombosis of the false lumen. This continues into the right common carotid. Left common carotid and left subclavian artery origins are patent. Right carotid system: As noted above, there is extension of dissection into the common carotid with occlusion of the false lumen resulting in narrowing of the true lumen. Minimal diameter of 1.5 mm. The external carotid origin is patent. There is minimal contrast enhancement at the ICA origin and subsequent minimal enhancement along portions of the cervical ICA. Left carotid system: Patent. Trace calcified plaque along the proximal ICA. No stenosis. Vertebral arteries: Patent.  Right vertebral artery is dominant. Skeleton: Degenerative changes of the cervical spine. Other neck: Unremarkable. Upper chest: Included upper lungs are clear. Review of the MIP images confirms the above findings CTA HEAD Anterior circulation: Possible faint enhancement of the intracranial right internal carotid artery. There is reconstitution at the terminus. Right M1 and proximal M2 MCA are patent. There is occlusion of a small right distal M2 and proximal M3 MCA branch corresponding to abnormality on noncontrast CT. Left middle and both anterior  cerebral arteries are patent. Anterior communicating artery is present. Posterior circulation: Intracranial vertebral arteries are patent. The left vertebral artery becomes small in caliber after PICA origin. Basilar artery is patent. Major cerebellar artery origins are patent. Posterior cerebral arteries are patent there is a left posterior communicating artery. Venous sinuses: Patent as allowed by contrast bolus timing. 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 This extends into the right common carotid with narrowing of the true lumen to a minimal diameter of 1.5 mm. Minimal enhancement within the right cervical and intracranial ICA. Reconstitution at the right ICA terminus. No proximal intracranial vessel occlusion. 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 These results were called by telephone at the time of interpretation on 11/04/2020 at 6:59 pm to provider [facility] , who verbally acknowledged these results. Electronically Signed   By: Guadlupe Spanish M.D.   On: 11/04/2020 19:10     PHYSICAL EXAM  Temp:  [96.8 F (36 C)-97.7 F (36.5 C)] 97.3 F (36.3 C) (07/16 1139) Pulse Rate:  [47-64] 59 (07/16 1000) Resp:  [7-15] 7 (07/16 1000) BP: (77-177)/(52-103) 102/64 (07/16 1000) SpO2:  [97 %-100 %] 100 % (07/16 1140) FiO2 (%):  [40 %] 40 % (07/16 1140) Weight:  [64.3 kg] 64.3 kg (07/16 0600)  General -frail middle-aged  Caucasian male, intubated on ventilator Ophthalmologic - fundi not visualized due to noncooperation.  Cardiovascular - Regular rate and rhythm.  Neuro - intubated off sedation, eyes closed but opens to stimulation not following commands consistently but will only do so with prompts on the right  side.  With forced eye opening, eyes i right gaze deviation and unable to look to the left., not blinking to visual threat, doll's eyes present, not tracking, PERRL. Corneal reflex weakly present bilaterally, gag and cough absent. Breathing  over the vent.  Facial symmetry not able to test due to ET tube.  Tongue protrusion not cooperative. On pain stimulation, moving all extremities but right much greater than left. DTR diminished and no babinski. Sensation, coordination and gait not tested.     ASSESSMENT/PLAN Jesse [name] is a 45 y.o. male with history of anxiety, now seeing doctor for 22 years admitted for confusion, headache, blurry vision and aphasia. No tPA given due to outside window and aortic dissection.    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  Tiny punctate bilateral cerebral and cerebellar embolic infarcts MRA head and neck interval revascularization of the right ICA and MCA and right aorto to subclavian bypass with patent right carotid artery 2D Echo EF 55 to 60% LDL 49 HgbA1c 5.2 Heparin subq for VTE prophylaxis No antithrombotic prior to admission, now on ASA 81 per CTS.  Ongoing aggressive stroke risk factor management Therapy recommendations: Pending Disposition: Pending  Aortic dissection CTA head and neck showed type A aortic dissection, not involving descending aorta CTS on board Status post repair MRA neck  pending  Respiratory failure Intubated on vent CCM on board On sedation with intermittent Versed IV  Dysphagia Currently n.p.o. Speech on board Core track placed On tube feeding  Acute blood loss anemia Hb 7.1-7.5-8.5-9.5-10.2->8.7-> 7.8 Status post PRBC during procedure Close monitoring On ASA  Delirium  Multifactorial, likely related to stroke, procedure, anemia Intubated Soft wrist restrain On precedex   Other Stroke Risk Factors   Other Active Problems Hyponatremia Na [id number]  Hospital day # 3 Continue weaning off ventilatory support as per cardiothoracic team.  Check EEG for seizure activity.  Extubate as tolerated.  No family available at the bedside for discussion.  Expect slow improvement over the next few days. This patient is critically ill due to respiratory failure, aortic dissection status post surgery, large right MCA stroke, severe anemia, dysphagia and delirium and at significant risk of neurological worsening, death form recurrent stroke, hemorrhagic conversion, CHF, cardiac arrest, seizure. This patient's care requires constant monitoring of vital signs, hemodynamics, respiratory and cardiac monitoring, review of multiple databases, neurological assessment, discussion with family, other specialists and medical decision making of high complexity. I spent 30 minutes of neurocritical care time in the care of this patient. I had long discussion with wife at bedside, updated pt current condition, treatment plan and potential prognosis, and answered all the questions.  She expressed understanding and appreciation.    Delia Heady MD Stroke Neurology 11/08/2020 11:52  AM    To contact Stroke Continuity provider, please refer to [email or web address]. After hours, contact General Neurology

## 2020-11-08 NOTE — Progress Notes (Signed)
EEG completed, results pending. 

## 2020-11-08 NOTE — Progress Notes (Signed)
4 Days Post-Op Procedure(s) (LRB): 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 WITH REPLACEMENT OF ASCENDING AORTA AND HEMIARCH USING HEMASHIELD PLATINUM GRAFT AND HEMASHIELD GOLD 14 X GRAFT, RESUSPENSION OF NATIVE VALVE, AORTA TO RIGHT CAROTID BYPASS, AORTA TO RIGHT SUBCLAVIAN BYPASS (N/A) TRANSESOPHAGEAL ECHOCARDIOGRAM (TEE) (N/A) Subjective: Intubated, sedated. Some agitation last night requiring increased sedation  Objective: Vital signs in last 24 hours: Temp:  [96.8 F (36 C)-97.7 F (36.5 C)] 97.4 F (36.3 C) (07/16 0343) Pulse Rate:  [47-64] 60 (07/16 0600) Cardiac Rhythm: Normal sinus rhythm;Bundle branch block (07/16 0600) Resp:  [14-15] 14 (07/16 0600) BP: (84-177)/(57-103) 103/66 (07/16 0600) SpO2:  [97 %-100 %] 100 % (07/16 0600) FiO2 (%):  [40 %] 40 % (07/16 0600) Weight:  [64.3 kg] 64.3 kg (07/16 0600)  Hemodynamic parameters for last 24 hours:    Intake/Output from previous day: 07/15 0701 - 07/16 0700 In: 672.9 [I.V.:592.9; NG/GT:80] Out: 1745 [Urine:1705; Chest Tube:40] Intake/Output this shift: No intake/output data recorded.  General appearance: intubated, sedated Neurologic: unable to assess due to sedation Heart: regular rate and rhythm Lungs: coarse BS Abdomen: soft, nontender Extremities: well perfused Wound: dressing in place  Lab Results: Recent Labs    11/06/20 2003 11/06/20 2029 11/07/20 0031 11/07/20 0422 11/07/20 0624  WBC 7.8  --  7.9  --   --   HGB 9.7*   < > 8.8* 7.8* 7.8*  HCT 29.1*   < > 25.9* 23.0* 23.0*  PLT 94*  --  84*  --   --    < > = values in this interval not displayed.   BMET:  Recent Labs    11/07/20 0031 11/07/20 0422 11/07/20 0624 11/07/20 2111 11/08/20 0443  NA 136   < > 136  --  136  K 3.4*   < > 3.4* 3.5 3.2*  CL 100  --   --   --  102  CO2 30  --   --   --  29  GLUCOSE 112*  --   --   --  112*  BUN 17  --   --   --  25*  CREATININE 1.20  --   --   --  1.11  CALCIUM 7.8*  --   --   --  7.8*    < > = values in this interval not displayed.    PT/INR: No results for input(s): LABPROT, INR in the last 72 hours. ABG    Component Value Date/Time   PHART 7.361 11/07/2020 0624   HCO3 27.7 11/07/2020 0624   TCO2 29 11/07/2020 0624   ACIDBASEDEF 1.0 11/05/2020 1305   O2SAT 88.0 11/07/2020 0624   CBG (last 3)  Recent Labs    11/08/20 0011 11/08/20 0341 11/08/20 0753  GLUCAP 119* 99 134*    Assessment/Plan: S/P Procedure(s) (LRB): 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 WITH REPLACEMENT OF ASCENDING AORTA AND HEMIARCH USING HEMASHIELD PLATINUM GRAFT AND HEMASHIELD GOLD 14 X GRAFT, RESUSPENSION OF NATIVE VALVE, AORTA TO RIGHT CAROTID BYPASS, AORTA TO RIGHT SUBCLAVIAN BYPASS (N/A) TRANSESOPHAGEAL ECHOCARDIOGRAM (TEE) (N/A) POD # 4 repair of aortic dissection NEURO- Right MCA infarct, some agitation possible withdrawal, requiring sedation CV- on low dose norepi at 2 mcg/min. MRA showed 75% right subclavian stenosis  BP better when cuff moved to left arm.  Wean norepi as tolerated RESP_ VDRF, plan per CCM RENAL- creatinine stable, has been diuresing well  Hypokalemia- supplemented ENDO-= CBG well controlled GI- on tube  feedings HEME- anemia secondary to ABL, Hct down slightly, monitor Thrombocytopenia- PLT relatively stable, monitor Low dose enoxaparin + SCD for DVT prophylaxis   LOS: 3 days    Loreli Slot 11/08/2020

## 2020-11-08 NOTE — Progress Notes (Signed)
PT Cancellation Note  Patient Details Name: Jesse [name] MRN: [id number] DOB: [birth year]   Cancelled Treatment:    Reason Eval/Treat Not Completed: Medical issues which prohibited therapy. PT attempted to see pt twice for evaluation on this date, pt weaning from vent upon both attempts. PT will attempt to follow up tomorrow.   Arlyss Gandy 11/08/2020, 3:28 PM

## 2020-11-08 NOTE — Plan of Care (Signed)
Problem: Education: Goal: Knowledge of General Education information will improve Description: Including pain rating scale, medication(s)/side effects and non-pharmacologic comfort measures Outcome: Progressing   Problem: Health Behavior/Discharge Planning: Goal: Ability to manage health-related needs will improve Outcome: Progressing   Problem: Clinical Measurements: Goal: Ability to maintain clinical measurements within normal limits will improve Outcome: Progressing Goal: Will remain free from infection Outcome: Progressing Goal: Diagnostic test results will improve Outcome: Progressing Goal: Respiratory complications will improve Outcome: Progressing Goal: Cardiovascular complication will be avoided Outcome: Progressing   Problem: Activity: Goal: Risk for activity intolerance will decrease Outcome: Progressing   Problem: Nutrition: Goal: Adequate nutrition will be maintained Outcome: Progressing   Problem: Coping: Goal: Level of anxiety will decrease Outcome: Progressing   Problem: Elimination: Goal: Will not experience complications related to bowel motility Outcome: Progressing Goal: Will not experience complications related to urinary retention Outcome: Progressing   Problem: Pain Managment: Goal: General experience of comfort will improve Outcome: Progressing   Problem: Safety: Goal: Ability to remain free from injury will improve Outcome: Progressing   Problem: Skin Integrity: Goal: Risk for impaired skin integrity will decrease Outcome: Progressing   Problem: Education: Goal: Will demonstrate proper wound care and an understanding of methods to prevent future damage Outcome: Progressing Goal: Knowledge of disease or condition will improve Outcome: Progressing Goal: Knowledge of the prescribed therapeutic regimen will improve Outcome: Progressing Goal: Individualized Educational Video(s) Outcome: Progressing   Problem: Activity: Goal: Risk for  activity intolerance will decrease Outcome: Progressing   Problem: Cardiac: Goal: Will achieve and/or maintain hemodynamic stability Outcome: Progressing   Problem: Clinical Measurements: Goal: Postoperative complications will be avoided or minimized Outcome: Progressing   Problem: Respiratory: Goal: Respiratory status will improve Outcome: Progressing   Problem: Skin Integrity: Goal: Wound healing without signs and symptoms of infection Outcome: Progressing Goal: Risk for impaired skin integrity will decrease Outcome: Progressing   Problem: Urinary Elimination: Goal: Ability to achieve and maintain adequate renal perfusion and functioning will improve Outcome: Progressing   Problem: Education: Goal: Will demonstrate proper wound care and an understanding of methods to prevent future damage Outcome: Progressing Goal: Knowledge of disease or condition will improve Outcome: Progressing Goal: Knowledge of the prescribed therapeutic regimen will improve Outcome: Progressing Goal: Individualized Educational Video(s) Outcome: Progressing   Problem: Activity: Goal: Risk for activity intolerance will decrease Outcome: Progressing   Problem: Cardiac: Goal: Will achieve and/or maintain hemodynamic stability Outcome: Progressing   Problem: Clinical Measurements: Goal: Postoperative complications will be avoided or minimized Outcome: Progressing   Problem: Respiratory: Goal: Respiratory status will improve Outcome: Progressing   Problem: Skin Integrity: Goal: Wound healing without signs and symptoms of infection Outcome: Progressing Goal: Risk for impaired skin integrity will decrease Outcome: Progressing   Problem: Urinary Elimination: Goal: Ability to achieve and maintain adequate renal perfusion and functioning will improve Outcome: Progressing   Problem: Safety: Goal: Non-violent Restraint(s) Outcome: Progressing   Problem: Education: Goal: Knowledge of  disease or condition will improve Outcome: Progressing Goal: Knowledge of secondary prevention will improve Outcome: Progressing Goal: Knowledge of patient specific risk factors addressed and post discharge goals established will improve Outcome: Progressing Goal: Individualized Educational Video(s) Outcome: Progressing   Problem: Coping: Goal: Will verbalize positive feelings about self Outcome: Progressing Goal: Will identify appropriate support needs Outcome: Progressing   Problem: Health Behavior/Discharge Planning: Goal: Ability to manage health-related needs will improve Outcome: Progressing   Problem: Self-Care: Goal: Ability to participate in self-care as condition permits will improve Outcome: Progressing Goal: Verbalization  of feelings and concerns over difficulty with self-care will improve Outcome: Progressing Goal: Ability to communicate needs accurately will improve Outcome: Progressing

## 2020-11-08 NOTE — Progress Notes (Signed)
NAME:  Esequiel Kleinfelter, MRN:  [id number], DOB:  May 23, 1975, LOS: 3 ADMISSION DATE:  11/04/2020, CONSULTATION DATE:  7/14 REFERRING MD:  Dr. Cornelius Moras, CHIEF COMPLAINT:  hypoxia   History of Present Illness:   45 year old male with PMH as below, whish is significant for anxiety, depression, and tobacco abuse (Not treated medically; self medicated with ETOH). Admitted 7/12 w/ new stroke due to aortic dissection resulting in loss of flow to right carotid.,  Emergently underwent replacement of ascending aorta and proximal transverse aortic arch, Aorta to right common carotid bypass, and aorta to right subclavian bypass. Postoperatively he was transferred to the CVICU. 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   Had severe agitated delirium and then worsening oxygen requirements on am of 7/15 requiring intubation. PCCM asked to assist with care   Pertinent  Medical History   has a past medical history of Anxiety, Depression, Essential hypertension, and S/P aortic dissection repair (11/05/2020).  Significant Hospital Events: Including procedures, antibiotic start and stop dates in addition to other pertinent events   7/12 admit for aortic dissection, emergently for repair.  7/13.. Agitated at times.  7/14 agitation worsened and he became hypoxemic requiring heated high flow and NRB simultaneously. Unable to sustain sats over 85%. PCCM called emergently to bedside by bedside RN.  Intubated. New Left BBB. Placed on fent and precedex.  7/15 PCCM assuming care. Added clonazepam  7/16 stop sedation.  Attempting to wean.  Interim History / Subjective:  Weaning sedation.  Has moments of agitation.  Tolerating SBT with large tidal volumes and low respiratory rate.  Objective   Blood pressure 102/64, pulse (!) 59, temperature (!) 97.3 F (36.3 C), resp. rate (!) 7, height 6\' 2"  (1.88 m), weight 64.3 kg, SpO2 100 %.    Vent Mode: CPAP;PSV FiO2 (%):  [40 %] 40 % Set  Rate:  [15 bmp] 15 bmp Vt Set:  [570 mL] 570 mL PEEP:  [5 cmH20] 5 cmH20 Pressure Support:  [5 cmH20] 5 cmH20 Plateau Pressure:  [13 cmH20-17 cmH20] 17 cmH20   Intake/Output Summary (Last 24 hours) at 11/08/2020 1214 Last data filed at 11/08/2020 1100 Gross per 24 hour  Intake 1506.77 ml  Output 1790 ml  Net -283.23 ml    Filed Weights   11/06/20 0500 11/07/20 0624 11/08/20 0600  Weight: 68.9 kg 67.4 kg 64.3 kg    Examination: General this is a disheveled 45 year old white male he is currently sedated Intubated requiring mechanical ventilation HEENT: Pupils equal reactive orally intubated no JVD mucous membranes are moist Pulmonary: Currently on full ventilatory support.  No accessory use.  Chest is clear to auscultation. Cardiac: Regular rate rhythm, bradycardic.  Has new bundle branch block.  Sternal dressing is intact, chest tubes with decreasing sanguinous output.  No peripheral edema. Abdomen soft nontender positive bowel sounds orogastric tube currently in place GU clear yellow urine has Foley catheter Neuro sedated, will move all limbs spontaneously.   Resolved Hospital Problem list     Assessment & Plan:   Critically ill due to acute hypoxemic respiratory failure.  Etiology likely multifactorial, mucous plugging, and atelectasis, possible aspiration. Agitated delirium/acute metabolic encephalopathy superimposed on acute right MCA stroke due to aortic dissection.  Appears to be further complicated by history of untreated anxiety disorder Aortic dissection type A with extension into R carotid artery. S/p surgical repair Mild thrombocytopenia Right MCA stroke  Plan:  -Keep off sedation and attempt to extubate  once awake.  Based on his SBI on PSV he has adequate lung mechanics.   Best Practice (right click and "Reselect all SmartList Selections" daily)   Diet/type: tubefeeds DVT prophylaxis: LMWH GI prophylaxis: PPI Lines: N/A Foley:  Yes, and it is still  needed Code Status:  full code Last date of multidisciplinary goals of care discussion [ ]   CRITICAL CARE Performed by:   Total critical care time: 40 minutes  Critical care time was exclusive of separately billable procedures and treating other patients.  Critical care was necessary to treat or prevent imminent or life-threatening deterioration.  Critical care was time spent personally by me on the following activities: development of treatment plan with patient and/or surrogate as well as nursing, discussions with consultants, evaluation of patient's response to treatment, examination of patient, obtaining history from patient or surrogate, ordering and performing treatments and interventions, ordering and review of laboratory studies, ordering and review of radiographic studies, pulse oximetry, re-evaluation of patient's condition and participation in multidisciplinary rounds.  Lynnell Catalan, MD [facility] ICU Physician [facility]  Pager: [phone] Mobile: [phone] After hours: [phone].

## 2020-11-08 NOTE — Procedures (Signed)
Patient Name: Kord Monette  MRN: [id number]  Epilepsy Attending: Charlsie Quest  Referring Physician/Provider: Dr Delia Heady Date: 11/08/2020 Duration: 23.42 mins  Patient history: 45yo M with R MCA infarct and ams. EEG to evaluate for seizure  Level of alertness: Awake  AEDs during EEG study: Clonazepam  Technical aspects: This EEG study was done with scalp electrodes positioned according to the 10-20 International system of electrode placement. Electrical activity was acquired at a sampling rate of 500Hz  and reviewed with a high frequency filter of 70Hz  and a low frequency filter of 1Hz . EEG data were recorded continuously and digitally stored.   Description: No posterior dominant rhythm was seen. EEG showed continuous 5-8Hz  theta-alpha activity in left hemisphere and low amplitude 2-3h in right hemisphere. Hyperventilation and photic stimulation were not performed.     ABNORMALITY - Continuous slow, generalized and lateralized right hemisphere  IMPRESSION: This study is suggestive of cortical dysfunction arising from right hemisphere likely secondary to underlying structural abnormality/stroke. Additionally, there is moderate diffuse encephalopathy, nonspecific etiology. No seizures or epileptiform discharges were seen throughout the recording.  Maedell Hedger 

## 2020-11-08 NOTE — Progress Notes (Signed)
Cortrak not in place.Measurement  at 27 cm.TF turned off.

## 2020-11-09 ENCOUNTER — Inpatient Hospital Stay (HOSPITAL_COMMUNITY): Payer: Medicaid Other

## 2020-11-09 LAB — BASIC METABOLIC PANEL
Anion gap: 6 (ref 5–15)
BUN: 24 mg/dL — ABNORMAL HIGH (ref 6–20)
CO2: 30 mmol/L (ref 22–32)
Calcium: 7.9 mg/dL — ABNORMAL LOW (ref 8.9–10.3)
Chloride: 103 mmol/L (ref 98–111)
Creatinine, Ser: 0.98 mg/dL (ref 0.61–1.24)
GFR, Estimated: >60 mL/min (ref >60)
Glucose, Bld: 96 mg/dL (ref 70–99)
Potassium: 4.1 mmol/L (ref 3.5–5.1)
Sodium: 139 mmol/L (ref 135–145)

## 2020-11-09 LAB — GLUCOSE, CAPILLARY
Glucose-Capillary: 106 mg/dL — ABNORMAL HIGH (ref 70–99)
Glucose-Capillary: 127 mg/dL — ABNORMAL HIGH (ref 70–99)
Glucose-Capillary: 130 mg/dL — ABNORMAL HIGH (ref 70–99)
Glucose-Capillary: 83 mg/dL (ref 70–99)
Glucose-Capillary: 94 mg/dL (ref 70–99)
Glucose-Capillary: 94 mg/dL (ref 70–99)

## 2020-11-09 MED ORDER — ASPIRIN 81 MG PO CHEW
81.0000 mg | CHEWABLE_TABLET | Freq: Every day | ORAL | Status: DC
  Administered 2020-11-10: 81 mg via ORAL
  Filled 2020-11-09: qty 1

## 2020-11-09 MED ORDER — POLYETHYLENE GLYCOL 3350 17 G PO PACK
17.0000 g | PACK | Freq: Every day | ORAL | Status: DC
  Administered 2020-11-10: 17 g via ORAL
  Filled 2020-11-09: qty 1

## 2020-11-09 MED ORDER — CLONAZEPAM 0.5 MG PO TBDP: 1.0000 mg | ORAL_TABLET | Freq: Three times a day (TID) | ORAL | Status: DC

## 2020-11-09 MED ORDER — VALPROIC ACID 250 MG/5ML PO SOLN
250.0000 mg | Freq: Three times a day (TID) | ORAL | Status: DC
  Administered 2020-11-09 – 2020-11-13 (×13): 250 mg
  Filled 2020-11-09 (×13): qty 5

## 2020-11-09 MED ORDER — PANTOPRAZOLE SODIUM 40 MG PO TBEC: 40.0000 mg | DELAYED_RELEASE_TABLET | Freq: Every day | ORAL | Status: DC

## 2020-11-09 MED ORDER — CLONAZEPAM 0.5 MG PO TBDP
2.0000 mg | ORAL_TABLET | Freq: Three times a day (TID) | ORAL | Status: DC
  Administered 2020-11-09 – 2020-11-11 (×6): 2 mg via ORAL
  Filled 2020-11-09 (×6): qty 4

## 2020-11-09 MED ORDER — BUPROPION HCL 75 MG PO TABS
75.0000 mg | ORAL_TABLET | Freq: Two times a day (BID) | ORAL | Status: DC
  Administered 2020-11-09 – 2020-11-11 (×4): 75 mg via ORAL
  Filled 2020-11-09 (×5): qty 1

## 2020-11-09 MED ORDER — DOCUSATE SODIUM 50 MG/5ML PO LIQD
100.0000 mg | Freq: Two times a day (BID) | ORAL | Status: DC
  Administered 2020-11-10: 100 mg via ORAL
  Filled 2020-11-09: qty 10

## 2020-11-09 MED ORDER — ADULT MULTIVITAMIN W/MINERALS CH
1.0000 | ORAL_TABLET | Freq: Every day | ORAL | Status: DC
  Administered 2020-11-10: 1 via ORAL
  Filled 2020-11-09 (×2): qty 1

## 2020-11-09 NOTE — Progress Notes (Signed)
STROKE TEAM PROGRESS NOTE   SUBJECTIVE (INTERVAL HISTORY) Dr Denese KillingsAgarwala is at the bedside.   Patient is now extubated and breathing well.  He is having his core track tube repositioned.  He is sedated for this.  He can be aroused and follows few simple midline and right-sided commands.  Is able to withdraw the left leg to noxious stimuli but left upper extremity remains paralyzed Vital signs are stable.  EEG shows focal right hemispheric slowing and mild generalized slowing but no definite epileptiform activity. OBJECTIVE Temp:  [98.1 F (36.7 C)-99 F (37.2 C)] 98.7 F (37.1 C) (07/17 1145) Pulse Rate:  [50-200] 62 (07/17 0822) Cardiac Rhythm: Sinus bradycardia (07/17 0800) Resp:  [12-26] 19 (07/17 0822) BP: (108-211)/(73-116) 112/89 (07/17 0800) SpO2:  [83 %-100 %] 100 % (07/17 0822) FiO2 (%):  [40 %-100 %] 50 % (07/17 0822) Weight:  [62.2 kg] 62.2 kg (07/17 0500)  Recent Labs  Lab 11/08/20 1957 11/08/20 2332 11/09/20 0328 11/09/20 0736 11/09/20 1134  GLUCAP 86 99 127* 94 83   Recent Labs  Lab 11/06/20 0510 11/06/20 0615 11/06/20 2003 11/06/20 2029 11/07/20 0031 11/07/20 0422 11/07/20 0624 11/07/20 1350 11/07/20 2111 11/08/20 0443 11/08/20 1648 11/09/20 0101  NA 136  --  134*   < > 136 137 136  --   --  136  --  139  K >7.5*   < > 3.4*   < > 3.4* 3.1* 3.4*  --  3.5 3.2* 4.6 4.1  CL 106  --  96*  --  100  --   --   --   --  102  --  103  CO2 26  --  28  --  30  --   --   --   --  29  --  30  GLUCOSE 116*  --  125*  --  112*  --   --   --   --  112*  --  96  BUN 11  --  15  --  17  --   --   --   --  25*  --  24*  CREATININE 1.11  --  1.03  --  1.20  --   --   --   --  1.11  --  0.98  CALCIUM 6.9*  --  8.0*  --  7.8*  --   --   --   --  7.8*  --  7.9*  MG 1.8  --  2.1  --  1.9  --   --  2.3 2.2 2.3 2.3  --   PHOS  --   --  3.5  --   --   --   --  3.3 3.3 2.9 2.9  --    < > = values in this interval not displayed.   Recent Labs  Lab 11/04/20 1837 11/07/20 0031   AST 20 40  ALT 15 12  ALKPHOS 55 41  BILITOT 0.7 0.4  PROT 6.3* 4.8*  ALBUMIN 3.7 2.5*   Recent Labs  Lab 11/04/20 1837 11/04/20 1840 11/05/20 0920 11/05/20 1110 11/05/20 1208 11/05/20 1305 11/06/20 0510 11/06/20 2003 11/06/20 2029 11/07/20 0031 11/07/20 0422 11/07/20 0624  WBC 12.7*   < > 6.8  --  9.6  --  8.2 7.8  --  7.9  --   --   NEUTROABS 10.8*  --   --   --   --   --   --   --   --   --   --   --  HGB 14.5   < > 9.6*   < > 9.8*   < > 8.7* 9.7* 8.8* 8.8* 7.8* 7.8*  HCT 42.4   < > 27.9*   < > 29.4*   < > 26.0* 29.1* 26.0* 25.9* 23.0* 23.0*  MCV 97.2   < > 98.2  --  99.3  --  102.4* 98.6  --  100.0  --   --   PLT 170   < > 107*  --  121*  --  90* 94*  --  84*  --   --    < > = values in this interval not displayed.   No results for input(s): CKTOTAL, CKMB, CKMBINDEX, TROPONINI in the last 168 hours. No results for input(s): LABPROT, INR in the last 72 hours.  No results for input(s): COLORURINE, LABSPEC, PHURINE, GLUCOSEU, HGBUR, BILIRUBINUR, KETONESUR, PROTEINUR, UROBILINOGEN, NITRITE, LEUKOCYTESUR in the last 72 hours.  Invalid input(s): APPERANCEUR     Component Value Date/Time   CHOL 112 11/06/2020 0510   TRIG 196 (H) 11/06/2020 0510   HDL 24 (L) 11/06/2020 0510   CHOLHDL 4.7 11/06/2020 0510   VLDL 39 11/06/2020 0510   LDLCALC 49 11/06/2020 0510   Lab Results  Component Value Date   HGBA1C 5.2 11/06/2020   No results found for: LABOPIA, COCAINSCRNUR, LABBENZ, AMPHETMU, THCU, LABBARB  Recent Labs  Lab 11/04/20 1837  ETH <10    I have personally reviewed the radiological images below and agree with the radiology interpretations.  DG Chest 1 View  Result Date: 11/07/2020 CLINICAL DATA:  45 year old male with code stroke. Attempted to pull out endotracheal tube. EXAM: CHEST  1 VIEW COMPARISON:  Chest x-ray 11/06/2020. FINDINGS: An endotracheal tube is in place with tip 8.9 cm above the carina. A nasogastric tube is seen extending into the stomach,  however, the tip of the nasogastric tube extends below the lower margin of the image. There is a surgical drain projecting over the medial aspect of the right hemithorax, either a mediastinal drain or medially placed right-sided chest tube. Lung volumes are normal. Opacity in the left lower lobe, favored to represent postoperative atelectasis. Small left pleural effusion. Right lung is clear. No right pleural effusion. No appreciable pneumothorax. No evidence of pulmonary edema. Heart size is normal. Mediastinal contours are within normal limits. Surgical clips are noted to the right-side of the thoracic inlet. IMPRESSION: 1. Support apparatus, as above. 2. Probable atelectasis in the left lower lobe. 3. Small left pleural effusion. Electronically Signed   By: Trudie Reed M.D.   On: 11/07/2020 06:31   DG Abd 1 View  Result Date: 11/09/2020 CLINICAL DATA:  NG tube placement EXAM: ABDOMEN - 1 VIEW COMPARISON:  11/07/2020 FINDINGS: Limited radiograph of the lower chest and upper abdomen was obtained for the purposes of enteric tube localization. Enteric tube is seen coursing below the diaphragm with distal tip terminating within the expected location of the gastric body. IMPRESSION: Enteric tube tip terminating within the gastric body. Electronically Signed   By: Duanne Guess D.O.   On: 11/09/2020 10:52   CT HEAD WO CONTRAST  Result Date: 11/06/2020 CLINICAL DATA:  45 year old male code stroke presentation. Type a aortic dissection. Right carotid involvement and occlusion, distal right M2 occlusion. Subsequent encounter. EXAM: CT HEAD WITHOUT CONTRAST TECHNIQUE: Contiguous axial images were obtained from the base of the skull through the vertex without intravenous contrast. COMPARISON:  CTA head and neck, and CT headi 11/04/2020 FINDINGS: Brain: Confluent  cytotoxic edema throughout a roughly 8 cm area of the posterior right hemisphere involving the posterior right MCA and right MCA/PCA watershed area.  See sagittal image 15. Mild regional mass effect. Mild mass effect on the right lateral ventricle and trace leftward midline shift (series 3, image 23). No hemorrhagic transformation. Basilar cisterns remain patent. No ventriculomegaly. Deep gray nuclei appear stable. Left hemisphere and posterior fossa gray-white matter differentiation is stable. Vascular: No large vessel hyperdensity. Skull: Negative. Sinuses/Orbits: Visualized paranasal sinuses and mastoids are stable and well aerated. Other: Visualized orbits and scalp soft tissues are within normal limits. IMPRESSION: 1. Relatively large infarct with cytotoxic edema in the posterior Right MCA and MCA/PCA watershed territories. Mild mass effect including trace leftward midline shift. No hemorrhagic transformation. 2. Preserved gray-white matter differentiation elsewhere. Electronically Signed   By: Odessa Fleming M.D.   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 Status post graft repair. EXAM: MRI HEAD WITHOUT CONTRAST MRA HEAD WITHOUT CONTRAST MRA NECK WITHOUT AND WITH CONTRAST TECHNIQUE: Multiplanar, multi-echo pulse sequences of the brain and surrounding structures were acquired without intravenous contrast. Angiographic images of the Circle of Willis were acquired using MRA technique without intravenous contrast. Angiographic images of the neck were acquired using MRA technique without and with intravenous contrast. Carotid stenosis measurements (when applicable) are obtained utilizing NASCET criteria, using the distal internal carotid diameter as the denominator. CONTRAST:  6.61mL GADAVIST GADOBUTROL 1 MMOL/ML IV SOLN COMPARISON:  Prior CTs from 11/06/2020 and 11/04/2020 FINDINGS: MRI HEAD FINDINGS Brain: Cerebral volume within normal limits. Few  scattered subcentimeter foci of T2/FLAIR hyperintensity noted involving the periventricular and deep white matter both cerebral hemispheres, consistent with chronic microvascular ischemic disease, mild in nature. Moderately large area of restricted diffusion involving the right temporal occipital and parietal region, consistent with right MCA and MCA/PCA watershed infarct, relatively stable from prior CT. Associated gyral swelling and edema with regional mass effect. Associated right-to-left shift now measures 5 mm, slightly increased from prior. No associated hemorrhage. Multiple additional scattered subcentimeter acute ischemic infarcts noted involving the bilateral cerebral hemispheres, embolic in nature. Largest of these additional foci seen at the high posterior right parietal region and measures 8 mm (series 5, image 94). Patchy involvement of the basal ganglia/corona radiata noted. Few additional punctate ischemic infarcts noted involving the right cerebellum. No associated hemorrhage or mass effect about these additional infarcts. Gray-white matter differentiation otherwise maintained. No other areas of chronic cortical infarction. No acute intracranial hemorrhage. Few scattered punctate chronic micro hemorrhages noted involving both cerebral hemispheres, likely related to underlying hypertension. No mass lesion. No hydrocephalus or ventricular trapping. Basilar cisterns remain patent. No extra-axial fluid collection. Pituitary gland suprasellar region normal. Midline structures intact. Vascular: Major intracranial vascular flow voids are maintained. Skull and upper cervical spine: Craniocervical junction within normal limits. Bone marrow signal intensity normal. No scalp soft tissue abnormality. Sinuses/Orbits: Globes and orbital soft tissues within normal limits. Scattered mucosal thickening noted throughout the ethmoidal air cells and maxillary sinuses. Fluid seen within the nasopharynx with small to  moderate bilateral mastoid effusions. Patient is intubated. Other: None. MRA HEAD FINDINGS Anterior circulation: Widely patent antegrade flow seen within the distal cervical segments of both internal carotid arteries, with interval re-establishment of flow within the cervical right ICA since prior. Both ICAs remain  patent to the termini without definite stenosis. Apparent focal stenoses involving the anterior genus of both cavernous ICAs on MIP reconstructions favored to be artifactual in nature. A1 segments patent bilaterally. Normal anterior communicating artery complex. Anterior cerebral arteries patent to their distal aspects without stenosis. No M1 stenosis or occlusion. Normal MCA bifurcations. Previously seen right MCA branch occlusions not definitely seen by MRA. Right MCA branches now appear well perfused and symmetric. Posterior circulation: Both V4 segments patent to the vertebrobasilar junction without stenosis. Right vertebral artery dominant. Partially visualized PICA patent. Basilar patent to its distal aspect without stenosis. Superior cerebellar arteries patent bilaterally. Right PCA primarily supplied via the basilar. Fetal type origin of the left PCA. Both PCAs remain well perfused to their distal aspects. Anatomic variants: Fetal type origin of the left PCA.  No aneurysm. 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 The visualized native arch is normal in caliber. The native left common carotid and subclavian arteries arise directly from the native arch. There has also been interval performance of an aorta to right subclavian bypass that supplies the right common and subclavian arteries. No visible stenosis or adverse features. Right carotid system: Right CCA now seen arising from the right subclavian bypass. Previously seen right CCA dissection has been repaired, with the right CCA now seen  widely patent to the bifurcation. No significant atheromatous narrowing or irregularity about the right carotid bulb. Right ICA now patent and well perfused to the skull base without stenosis or occlusion. No MRA evidence for residual dissection. Left carotid system: Left CCA arises from the repaired native aortic arch and is widely patent to the bifurcation. No significant narrowing about the left carotid bulb. Left ICA patent distally to the skull base without stenosis, evidence for dissection or occlusion. Vertebral arteries: Left vertebral artery arises from the proximal left subclavian artery, just beyond its takeoff from the native aortic arch. Right vertebral artery arises from the right subclavian bypass. 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 Vertebral arteries themselves are patent within the neck without stenosis, evidence for dissection, or occlusion. Right vertebral artery dominant. Other: None. IMPRESSION: MRI HEAD IMPRESSION: 1. 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 Associated regional mass effect with 5 mm of right-to-left shift, slightly increased from prior. 2. Additional scattered subcentimeter acute ischemic infarcts involving the bilateral cerebral hemispheres and right cerebellum, embolic in nature. No associated hemorrhage or mass effect. 3. No other acute intracranial abnormality. MRA HEAD IMPRESSION: 1. Interval re-establishment of flow within the right ICA, now patent to the terminus. Right MCA well perfused, with no visible persistent downstream occlusion evident by MRA. 2. Otherwise stable and unremarkable intracranial MRA. MRA NECK IMPRESSION: 1. Interval graft repair of previously seen aortic dissection with performance of an aorta to  right subclavian bypass. 2. Right carotid artery system supplied via the right subclavian bypass, and is now widely patent without residual dissection or other abnormality. 3. Left carotid artery system supplied via the native aortic arch, and remains widely patent without stenosis or other abnormality. 4. 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 Follow-up examination with dedicated CTA could be performed for further evaluation  of this finding as warranted. 5. Both vertebral arteries remain widely patent within the neck. Right vertebral artery dominant. Electronically Signed   By: Rise Mu M.D.   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 Status post graft repair. EXAM: MRI HEAD WITHOUT CONTRAST MRA HEAD WITHOUT CONTRAST MRA NECK WITHOUT AND WITH CONTRAST TECHNIQUE: Multiplanar, multi-echo pulse sequences of the brain and surrounding structures were acquired without intravenous contrast. Angiographic images of the Circle of Willis were acquired using MRA technique without intravenous contrast. Angiographic images of the neck were acquired using MRA technique without and with intravenous contrast. Carotid stenosis measurements (when applicable) are obtained utilizing NASCET criteria, using the distal internal carotid diameter as the denominator. CONTRAST:  6.55mL GADAVIST GADOBUTROL 1 MMOL/ML IV SOLN COMPARISON:  Prior CTs from 11/06/2020 and 11/04/2020 FINDINGS: MRI HEAD FINDINGS Brain: Cerebral volume within normal limits. Few scattered subcentimeter foci of T2/FLAIR hyperintensity noted involving the periventricular and deep white matter both cerebral hemispheres, consistent with chronic  microvascular ischemic disease, mild in nature. Moderately large area of restricted diffusion involving the right temporal occipital and parietal region, consistent with right MCA and MCA/PCA watershed infarct, relatively stable from prior CT. Associated gyral swelling and edema with regional mass effect. Associated right-to-left shift now measures 5 mm, slightly increased from prior. No associated hemorrhage. Multiple additional scattered subcentimeter acute ischemic infarcts noted involving the bilateral cerebral hemispheres, embolic in nature. Largest of these additional foci seen at the high posterior right parietal region and measures 8 mm (series 5, image 94). Patchy involvement of the basal ganglia/corona radiata noted. Few additional punctate ischemic infarcts noted involving the right cerebellum. No associated hemorrhage or mass effect about these additional infarcts. Gray-white matter differentiation otherwise maintained. No other areas of chronic cortical infarction. No acute intracranial hemorrhage. Few scattered punctate chronic micro hemorrhages noted involving both cerebral hemispheres, likely related to underlying hypertension. No mass lesion. No hydrocephalus or ventricular trapping. Basilar cisterns remain patent. No extra-axial fluid collection. Pituitary gland suprasellar region normal. Midline structures intact. Vascular: Major intracranial vascular flow voids are maintained. Skull and upper cervical spine: Craniocervical junction within normal limits. Bone marrow signal intensity normal. No scalp soft tissue abnormality. Sinuses/Orbits: Globes and orbital soft tissues within normal limits. Scattered mucosal thickening noted throughout the ethmoidal air cells and maxillary sinuses. Fluid seen within the nasopharynx with small to moderate bilateral mastoid effusions. Patient is intubated. Other: None. MRA HEAD FINDINGS Anterior circulation: Widely patent antegrade flow seen within the distal  cervical segments of both internal carotid arteries, with interval re-establishment of flow within the cervical right ICA since prior. Both ICAs remain patent to the termini without definite stenosis. Apparent focal stenoses involving the anterior genus of both cavernous ICAs on MIP reconstructions favored to be artifactual in nature. A1 segments patent bilaterally. Normal anterior communicating artery complex. Anterior cerebral arteries patent to their distal aspects without stenosis. No M1 stenosis or occlusion. Normal MCA bifurcations. Previously seen right MCA branch occlusions not definitely seen by MRA. Right MCA branches now appear well perfused and symmetric. Posterior circulation: Both V4 segments patent to the vertebrobasilar junction without stenosis. Right vertebral artery dominant. Partially visualized PICA patent. Basilar patent to its distal aspect without stenosis. Superior cerebellar arteries patent bilaterally. Right PCA primarily supplied via the basilar. Fetal type  origin of the left PCA. Both PCAs remain well perfused to their distal aspects. Anatomic variants: Fetal type origin of the left PCA.  No aneurysm. 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 The visualized native arch is normal in caliber. The native left common carotid and subclavian arteries arise directly from the native arch. There has also been interval performance of an aorta to right subclavian bypass that supplies the right common and subclavian arteries. No visible stenosis or adverse features. Right carotid system: Right CCA now seen arising from the right subclavian bypass. Previously seen right CCA dissection has been repaired, with the right CCA now seen widely patent to the bifurcation. No significant atheromatous narrowing or irregularity about the right carotid bulb. Right ICA now patent and well perfused to the skull  base without stenosis or occlusion. No MRA evidence for residual dissection. Left carotid system: Left CCA arises from the repaired native aortic arch and is widely patent to the bifurcation. No significant narrowing about the left carotid bulb. Left ICA patent distally to the skull base without stenosis, evidence for dissection or occlusion. Vertebral arteries: Left vertebral artery arises from the proximal left subclavian artery, just beyond its takeoff from the native aortic arch. Right vertebral artery arises from the right subclavian bypass. 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 Vertebral arteries themselves are patent within the neck without stenosis, evidence for dissection, or occlusion. Right vertebral artery dominant. Other: None. IMPRESSION: MRI HEAD IMPRESSION: 1. 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 Associated regional mass effect with 5 mm of right-to-left shift, slightly increased from prior. 2. Additional scattered subcentimeter acute ischemic infarcts involving the bilateral cerebral hemispheres and right cerebellum, embolic in nature. No associated hemorrhage or mass effect. 3. No other acute intracranial abnormality. MRA HEAD IMPRESSION: 1. Interval re-establishment of flow within the right ICA, now patent to the terminus. Right MCA well perfused, with no visible persistent downstream occlusion evident by MRA. 2. Otherwise stable and unremarkable intracranial MRA. MRA NECK IMPRESSION: 1. 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 2. Right carotid artery system supplied via the right subclavian bypass, and is now widely patent without residual dissection or other  abnormality. 3. Left carotid artery system supplied via the native aortic arch, and remains widely patent without stenosis or other abnormality. 4. 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 Follow-up examination with dedicated CTA could be performed for further evaluation of this finding as warranted. 5. Both vertebral arteries remain widely patent within the neck. Right vertebral artery dominant. Electronically Signed   By: Rise Mu M.D.   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 Status post graft repair. EXAM: MRI HEAD WITHOUT CONTRAST MRA HEAD WITHOUT CONTRAST MRA NECK WITHOUT AND WITH CONTRAST TECHNIQUE: Multiplanar, multi-echo pulse sequences of the brain and surrounding structures were acquired without intravenous contrast. Angiographic images of the Circle of Willis were acquired using MRA  technique without intravenous contrast. Angiographic images of the neck were acquired using MRA technique without and with intravenous contrast. Carotid stenosis measurements (when applicable) are obtained utilizing NASCET criteria, using the distal internal carotid diameter as the denominator. CONTRAST:  6.33mL GADAVIST GADOBUTROL 1 MMOL/ML IV SOLN COMPARISON:  Prior CTs from 11/06/2020 and 11/04/2020 FINDINGS: MRI HEAD FINDINGS Brain: Cerebral volume within normal limits. Few scattered subcentimeter foci of T2/FLAIR hyperintensity noted involving the periventricular and deep white matter both cerebral hemispheres, consistent with chronic microvascular ischemic disease, mild in nature. Moderately large area of restricted diffusion involving the right temporal occipital and parietal region, consistent with  right MCA and MCA/PCA watershed infarct, relatively stable from prior CT. Associated gyral swelling and edema with regional mass effect. Associated right-to-left shift now measures 5 mm, slightly increased from prior. No associated hemorrhage. Multiple additional scattered subcentimeter acute ischemic infarcts noted involving the bilateral cerebral hemispheres, embolic in nature. Largest of these additional foci seen at the high posterior right parietal region and measures 8 mm (series 5, image 94). Patchy involvement of the basal ganglia/corona radiata noted. Few additional punctate ischemic infarcts noted involving the right cerebellum. No associated hemorrhage or mass effect about these additional infarcts. Gray-white matter differentiation otherwise maintained. No other areas of chronic cortical infarction. No acute intracranial hemorrhage. Few scattered punctate chronic micro hemorrhages noted involving both cerebral hemispheres, likely related to underlying hypertension. No mass lesion. No hydrocephalus or ventricular trapping. Basilar cisterns remain patent. No extra-axial fluid collection. Pituitary gland suprasellar region normal. Midline structures intact. Vascular: Major intracranial vascular flow voids are maintained. Skull and upper cervical spine: Craniocervical junction within normal limits. Bone marrow signal intensity normal. No scalp soft tissue abnormality. Sinuses/Orbits: Globes and orbital soft tissues within normal limits. Scattered mucosal thickening noted throughout the ethmoidal air cells and maxillary sinuses. Fluid seen within the nasopharynx with small to moderate bilateral mastoid effusions. Patient is intubated. Other: None. MRA HEAD FINDINGS Anterior circulation: Widely patent antegrade flow seen within the distal cervical segments of both internal carotid arteries, with interval re-establishment of flow within the cervical right ICA since prior. Both ICAs remain patent to the termini  without definite stenosis. Apparent focal stenoses involving the anterior genus of both cavernous ICAs on MIP reconstructions favored to be artifactual in nature. A1 segments patent bilaterally. Normal anterior communicating artery complex. Anterior cerebral arteries patent to their distal aspects without stenosis. No M1 stenosis or occlusion. Normal MCA bifurcations. Previously seen right MCA branch occlusions not definitely seen by MRA. Right MCA branches now appear well perfused and symmetric. Posterior circulation: Both V4 segments patent to the vertebrobasilar junction without stenosis. Right vertebral artery dominant. Partially visualized PICA patent. Basilar patent to its distal aspect without stenosis. Superior cerebellar arteries patent bilaterally. Right PCA primarily supplied via the basilar. Fetal type origin of the left PCA. Both PCAs remain well perfused to their distal aspects. Anatomic variants: Fetal type origin of the left PCA.  No aneurysm. 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 The visualized native arch is normal in caliber. The native left common carotid and subclavian arteries arise directly from the native arch. There has also been interval performance of an aorta to right subclavian bypass that supplies the right common and subclavian arteries. No visible stenosis or adverse features. Right carotid system: Right CCA now seen arising from the right subclavian bypass. Previously seen right CCA dissection has been repaired, with the  right CCA now seen widely patent to the bifurcation. No significant atheromatous narrowing or irregularity about the right carotid bulb. Right ICA now patent and well perfused to the skull base without stenosis or occlusion. No MRA evidence for residual dissection. Left carotid system: Left CCA arises from the repaired native aortic arch and is widely patent to  the bifurcation. No significant narrowing about the left carotid bulb. Left ICA patent distally to the skull base without stenosis, evidence for dissection or occlusion. Vertebral arteries: Left vertebral artery arises from the proximal left subclavian artery, just beyond its takeoff from the native aortic arch. Right vertebral artery arises from the right subclavian bypass. 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 Vertebral arteries themselves are patent within the neck without stenosis, evidence for dissection, or occlusion. Right vertebral artery dominant. Other: None. IMPRESSION: MRI HEAD IMPRESSION: 1. 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 Associated regional mass effect with 5 mm of right-to-left shift, slightly increased from prior. 2. Additional scattered subcentimeter acute ischemic infarcts involving the bilateral cerebral hemispheres and right cerebellum, embolic in nature. No associated hemorrhage or mass effect. 3. No other acute intracranial abnormality. MRA HEAD IMPRESSION: 1. Interval re-establishment of flow within the right ICA, now patent to the terminus. Right MCA well perfused, with no visible persistent downstream occlusion evident by MRA. 2. Otherwise stable and unremarkable intracranial MRA. MRA NECK IMPRESSION: 1. 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 2. Right carotid artery system supplied via the right subclavian bypass, and is now widely patent without residual dissection or other abnormality. 3. Left carotid artery system supplied via the native aortic arch, and remains widely patent without stenosis or other abnormality. 4. 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 Follow-up examination with dedicated CTA could be performed for further evaluation of this finding as warranted. 5. Both vertebral arteries remain widely patent within the neck. Right vertebral artery dominant. Electronically Signed   By: Rise Mu M.D.   On: 11/07/2020 20:51   DG CHEST PORT 1 VIEW  Result Date: 11/09/2020 CLINICAL DATA:  NG tube placement EXAM: PORTABLE CHEST 1 VIEW COMPARISON:  11/08/2020 FINDINGS: Enteric tube tip terminates at the level of the distal esophagus. Stable cardiomediastinal contours. Hazy bibasilar opacities, slightly improved from prior. No pneumothorax is seen, although the lung apices were excluded from the field of view. IMPRESSION: 1. Enteric tube tip terminates at the level of the distal esophagus. Subsequent abdominal radiograph in PACS demonstrates interval advancement. 2. Improving aeration of the lung bases. Electronically Signed   By: Duanne Guess D.O.   On: 11/09/2020 10:52   DG Chest Port 1 View  Result Date: 11/08/2020 CLINICAL DATA:  Self extubation EXAM: PORTABLE CHEST 1 VIEW COMPARISON:  11/07/2020 FINDINGS: Single frontal view of the chest demonstrates interval removal of the endotracheal tube and enteric catheter. There is a linear radiopaque structure overlying the trachea at the C7 level, indeterminate in nature. Cardiac silhouette is unremarkable. Bibasilar veiling opacities are identified, left greater than right, consistent with underlying consolidation and/or effusion. No pneumothorax. IMPRESSION: 1. Progressive bibasilar veiling opacities, left greater than right, consistent with underlying consolidation and effusions. 2. Interval removal  of the endotracheal and enteric catheters. A short linear radiodensity projects over the trachea at the C7 level, of uncertain etiology. Electronically Signed   By: Sharlet Salina M.D.   On:  11/08/2020 17:06   DG CHEST PORT 1 VIEW  Result Date: 11/06/2020 CLINICAL DATA:  45 year old male status post intubation. EXAM: PORTABLE CHEST 1 VIEW COMPARISON:  Chest radiograph dated 11/06/2020. FINDINGS: Endotracheal tube with tip approximately 5.5 cm above the carina. Interval removal of the right IJ catheter. Two inferiorly accessed mediastinal drains again noted. Probable small left pleural effusion. Right infrahilar hazy density, possibly atelectasis. No obvious pneumothorax. Stable cardiomegaly. No acute osseous pathology. IMPRESSION: 1. Interval removal of the right IJ catheter. 2. Endotracheal tube above the carina. 3. Probable small left pleural effusion. Electronically Signed   By: Elgie Collard M.D.   On: 11/06/2020 20:42   DG Chest Port 1 View  Addendum Date: 11/06/2020   ADDENDUM REPORT: 11/06/2020 09:13 ADDENDUM: Also with slight increased retrocardiac opacification and RIGHT basilar opacification since prior study. These findings and findings in the initial report were discussed with the provider as outlined below. These results were called by telephone at the time of interpretation on 11/06/2020 at 9:13 am to provider Tressie Stalker , who verbally acknowledged these results. Electronically Signed   By: Donzetta Kohut M.D.   On: 11/06/2020 09:13   Result Date: 11/06/2020 CLINICAL DATA:  Post aortic dissection repair in a 45 year old male. EXAM: PORTABLE CHEST 1 VIEW COMPARISON:  November 05, 2020. FINDINGS: EKG leads project over the chest. Since the previous study the patient has been extubated. Chest support tubes project over the heart terminating just below the clavicular heads with respect to the more rightward oriented tube, not changed in position. The LEFT-sided tube terminates over the LEFT chest in the retrocardiac region with similar appearance. RIGHT IJ sheath in similar position Swan-Ganz catheter now removed. Persistent tiny RIGHT apical pneumothorax, unchanged. Trachea  midline. Cardiomediastinal contours and hilar structures are stable. Graded opacity at the RIGHT lung base is subtle with obscured LEFT hemidiaphragm along the medial aspect when compared to the prior study. Possible deep sulcus sign on the LEFT otherwise no evidence of LEFT-sided pneumothorax. On limited assessment no acute skeletal process. IMPRESSION: 1. Persistent tiny RIGHT apical pneumothorax. 2. Possible deep sulcus sign on the LEFT potentially a very small LEFT pneumothorax. Attention on follow-up. 3. Interval extubation, removal of gastric tube and removal of Swan-Ganz catheter, otherwise stable position of support apparatus. A call is out to the referring provider to further discuss findings in the above case. Electronically Signed: By: Donzetta Kohut M.D. On: 11/06/2020 09:05   DG Chest Port 1 View  Result Date: 11/05/2020 CLINICAL DATA:  45 year old male with open heart surgery. EXAM: PORTABLE CHEST 1 VIEW COMPARISON:  Chest CT dated 11/04/2020. FINDINGS: Right IJ Swan-Ganz catheter with tip likely close to the bifurcation of the main pulmonary trunk. Endotracheal tube with tip approximately 6.5 cm above the carina. Enteric tube extends below the diaphragm with tip beyond the inferior margin of the image. Inferiorly accessed mediastinal drain. The lungs are clear. There is no pleural effusion. Tiny linear lucency in the right apex may be artifactual or represent minimal right pneumothorax measuring approximately 4 mm in thickness. Close follow-up recommended. The cardiac silhouette is within limits. No acute osseous pathology. IMPRESSION: 1. Status post open heart surgery with support apparatus as above. 2. Possible tiny right apical pneumothorax. Attention on follow-up imaging recommended. These results were called by  telephone at the time of interpretation on 11/05/2020 at 4:03 am to the patient's nurse Jess Barters, who verbally acknowledged these results. Electronically Signed   By: Elgie Collard M.D.    On: 11/05/2020 04:07   DG Abd Portable 1V  Result Date: 11/07/2020 CLINICAL DATA:  Feeding tube placement EXAM: PORTABLE ABDOMEN - 1 VIEW COMPARISON:  11/07/2020 at 0310 hours FINDINGS: Interval placement of large bore feeding tube which extends below the diaphragm, distal tip beyond the inferior margin of the film. Additional enteric tube is again seen terminating within the gastric body. Mediastinal drain and left basilar chest tube are seen. IMPRESSION: Interval placement of feeding tube which extends below the diaphragm into the stomach with distal tip beyond the inferior margin of the film. Electronically Signed   By: Duanne Guess D.O.   On: 11/07/2020 12:33   DG Abd Portable 1V  Result Date: 11/07/2020 CLINICAL DATA:  45 year male status post feeding tube placement. EXAM: PORTABLE ABDOMEN - 1 VIEW COMPARISON:  Chest radiograph dated 11/06/2020 and CT of the chest abdomen pelvis dated 11/04/2020. FINDINGS: Partially visualized enteric tube with side-port just distal to the GE junction and tip in the proximal body of the stomach. Inferiorly accessed mediastinal drains noted. No bowel dilatation or evidence of obstruction. No free air. Probable small left pleural effusion. IMPRESSION: Enteric tube with tip in the proximal body of the stomach. Electronically Signed   By: Elgie Collard M.D.   On: 11/07/2020 03:35   EEG adult  Result Date: 11/08/2020 Charlsie Quest, MD     11/08/2020  6:22 PM Patient Name: Lauren Aguayo MRN: [id number] Epilepsy Attending: Charlsie Quest Referring Physician/Provider: Dr Delia Heady Date: 11/08/2020 Duration: 23.42 mins Patient history: 45yo M with R MCA infarct and ams. EEG to evaluate for seizure Level of alertness: Awake AEDs during EEG study: Clonazepam Technical aspects: This EEG study was done with scalp electrodes positioned according to the 10-20 International system of electrode placement. Electrical activity was acquired at a sampling rate of  500Hz  and reviewed with a high frequency filter of 70Hz  and a low frequency filter of 1Hz . EEG data were recorded continuously and digitally stored. Description: No posterior dominant rhythm was seen. EEG showed continuous 5-8Hz  theta-alpha activity in left hemisphere and low amplitude 2-3h in right hemisphere. Hyperventilation and photic stimulation were not performed.   ABNORMALITY - Continuous slow, generalized and lateralized right hemisphere IMPRESSION: This study is suggestive of cortical dysfunction arising from right hemisphere likely secondary to underlying structural abnormality/stroke. Additionally, there is moderate diffuse encephalopathy, nonspecific etiology. No seizures or epileptiform discharges were seen throughout the recording. Priyanka   ECHO INTRAOPERATIVE TEE  Result Date: 11/05/2020  *INTRAOPERATIVE TRANSESOPHAGEAL REPORT *  Patient Name:   KATAI MARSICO Date of Exam: 11/04/2020 Medical Rec #:  11/07/2020     Height: Accession #:    [name]    Weight:       145.5 lb Date of Birth:  01/17/1976    BSA:          1.63 m Patient Age:    44 years      BP:           163/100 mmHg Patient Gender: M             HR:           70 bpm. Exam Location:  Inpatient Transesophogeal exam was perform intraoperatively during surgical procedure. Patient was closely monitored under general anesthesia during  the entirety of examination. Indications:     Aortic dissection Performing Phys: [id number] CLARENCE H OWEN Diagnosing Phys: Lewie Loron MD Complications: No known complications during this procedure. POST-OP IMPRESSIONS - Left Ventricle: The left ventricle is unchanged from pre-bypass. - Right Ventricle: The right ventricle appears unchanged from pre-bypass. - Aorta: there is no dissection present in the aorta A graft was placed in the ascending aorta for repair. - Left Atrial Appendage: The left atrial appendage appears unchanged from pre-bypass. - Aortic Valve: The aortic valve appears unchanged  from pre-bypass. - Mitral Valve: The mitral valve appears unchanged from pre-bypass. - Tricuspid Valve: The tricuspid valve appears unchanged from pre-bypass. PRE-OP FINDINGS  Left Ventricle: The left ventricle has normal systolic function, with an ejection fraction of 55-60%. The cavity size was normal. There is severe concentric left ventricular hypertrophy. Right Ventricle: The right ventricle has normal systolic function. The cavity was normal. There is no increase in right ventricular wall thickness. Left Atrium: Left atrial size was normal in size. No left atrial/left atrial appendage thrombus was detected. Right Atrium: Right atrial size was normal in size. Interatrial Septum: The interatrial septum was not assessed. There is no evidence of a patent foramen ovale. Pericardium: Trivial pericardial effusion is present. Mitral Valve: The mitral valve is normal in structure. Mitral valve regurgitation is trivial by color flow Doppler. Tricuspid Valve: The tricuspid valve was normal in structure. Tricuspid valve regurgitation is trivial by color flow Doppler. Aortic Valve: The aortic valve is tricuspid Aortic valve regurgitation is trivial by color flow Doppler. There is no stenosis of the aortic valve. Pulmonic Valve: The pulmonic valve was normal in structure. Pulmonic valve regurgitation is not visualized by color flow Doppler. Aorta: There is evidence of a dissection in the aortic root, ascending aorta and aortic arch. Shunts: There is no evidence of an atrial septal defect.  Lewie Loron MD Electronically signed by Lewie Loron MD Signature Date/Time: 11/05/2020/2:44:37 AM    Final    CT Angio Chest/Abd/Pel for Dissection W and/or W/WO  Result Date: 11/04/2020 CLINICAL DATA:  Further evaluation of aortic dissection seen on prior same day imaging EXAM: CT ANGIOGRAPHY CHEST, ABDOMEN AND PELVIS TECHNIQUE: Non-contrast CT of the chest was initially obtained. Multidetector CT imaging through the chest,  abdomen and pelvis was performed using the standard protocol during bolus administration of intravenous contrast. Multiplanar reconstructed images and MIPs were obtained and reviewed to evaluate the vascular anatomy. CONTRAST:  OMNIPAQUE IOHEXOL 350 MG/ML SOLN COMPARISON:  Same day CT neck. FINDINGS: CTA CHEST FINDINGS Cardiovascular: Ascending aortic dissection extending from superior to the sino-tubular junction to the transverse arch with continuation of the dissection into the innominate artery with thrombus in the false lumen of the innominate artery. No involvement of the descending thoracic aorta. The left main and right coronary arteries not appear to be involved and are patent as are the portions of the LAD and left circumflex. Normal size heart. No significant pericardial effusion/thickening. No central pulmonary embolus. Mediastinum/Nodes: No mediastinal fluid collection. No discrete thyroid nodule. No pathologically enlarged mediastinal, hilar or axillary lymph nodes. The trachea and esophagus are grossly unremarkable. Lungs/Pleura: No suspicious pulmonary nodules or masses. No focal consolidation. No pleural effusion. No pneumothorax. Musculoskeletal: No acute osseous abnormality. Review of the MIP images confirms the above findings. CTA ABDOMEN AND PELVIS FINDINGS VASCULAR Aorta: Aortic atherosclerosis. Normal caliber abdominal aorta without aneurysm, dissection, vasculitis or significant stenosis. Celiac: Patent without evidence of aneurysm, dissection, vasculitis or significant stenosis. SMA:  Patent without evidence of aneurysm, dissection, vasculitis or significant stenosis. Renals: Both renal arteries are patent without evidence of aneurysm, dissection, vasculitis, fibromuscular dysplasia or significant stenosis. IMA: Patent without evidence of aneurysm, dissection, vasculitis or significant stenosis. Inflow: Patent without evidence of aneurysm, dissection, vasculitis or significant stenosis.  Veins: No obvious venous abnormality within the limitations of this arterial phase study. Review of the MIP images confirms the above findings. NON-VASCULAR Hepatobiliary: No suspicious hepatic lesion. Gallbladder is unremarkable. No biliary ductal dilation. Pancreas: Within normal limits. Spleen: Within normal limits. Adrenals/Urinary Tract: Adrenal glands are unremarkable. Kidneys are normal, without renal calculi, solid enhancing lesion, or hydronephrosis. Symmetric renal enhancement. Bladder is unremarkable. Stomach/Bowel: Stomach is within normal limits. Appendix appears normal. No evidence of bowel wall thickening, distention, or inflammatory changes. Lymphatic: No pathologically enlarged abdominal or pelvic lymph nodes. Reproductive: Prostate is unremarkable. Other: No abdominopelvic ascites.  No pneumoperitoneum. Musculoskeletal: Mild multilevel degenerative change of the spine. No acute osseous abnormality Review of the MIP images confirms the above findings. IMPRESSION: 1. 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 No involvement of the descending thoracic aorta. No pericardial effusion or mediastinal fluid. 2. No acute findings within the abdomen or pelvis. 3.  Aortic Atherosclerosis (ICD10-I70.0). Electronically Signed   By: Maudry Mayhew MD   On: 11/04/2020 20:22   CT HEAD CODE STROKE WO CONTRAST  Result Date: 11/04/2020 CLINICAL DATA:  Code stroke.  Aphasia EXAM: CT HEAD WITHOUT CONTRAST TECHNIQUE: Contiguous axial images were obtained from the base of the skull through the vertex without intravenous contrast. COMPARISON:  None. FINDINGS: Brain: There is no acute intracranial hemorrhage or mass effect. Question small subtle loss of gray-white differentiation in the right insular region. Ventricles and  sulci are normal in size and configuration. Vascular: Question of hyperdensity along distal right M2 or proximal M3 MCA branch in the posterior sylvian fissure. Skull: Unremarkable. Sinuses/Orbits: No acute abnormality. Other: Mastoid air cells are clear. ASPECTS [facility]: [facility] Stroke Program Early CT Score) - Ganglionic level infarction (caudate, lentiform nuclei, internal capsule, insula, M1-M3 cortex): 6 - Supraganglionic infarction (M4-M6 cortex): 3 Total score (0-10 with 10 being normal): 9 IMPRESSION: No acute intracranial hemorrhage. Question subtle small loss of gray-white differentiation in the right insular region (ASPECT score is 9). Possible hyperdense distal right M2 or proximal M3 MCA branch in the posterior sylvian fissure. These results were communicated to Dr. Iver Nestle at 6:45 pm on 11/04/2020 by text page via the [facility] messaging system. Electronically Signed   By: Guadlupe Spanish M.D.   On: 11/04/2020 18:59   CT ANGIO HEAD NECK W WO CM (CODE STROKE)  Result Date: 11/04/2020 CLINICAL DATA:  Code stroke EXAM: CT ANGIOGRAPHY HEAD AND NECK TECHNIQUE: Multidetector CT imaging of the head and neck was performed using the standard protocol during bolus administration of intravenous contrast. Multiplanar CT image reconstructions and MIPs were obtained to evaluate the vascular anatomy. Carotid stenosis measurements (when applicable) are obtained utilizing NASCET criteria, using the distal internal carotid diameter as the denominator. CONTRAST:  82mL OMNIPAQUE IOHEXOL 350 MG/ML SOLN COMPARISON:  None. FINDINGS: CTA NECK Aortic arch: A dissection flap is present within the included ascending aorta extending into the transverse portion of the arch but not the descending portion. Dissection involves the innominate artery with thrombosis of the false lumen. This continues  into the right common carotid. Left common carotid and left subclavian artery origins are patent. Right carotid system: As noted above, there is  extension of dissection into the common carotid with occlusion of the false lumen resulting in narrowing of the true lumen. Minimal diameter of 1.5 mm. The external carotid origin is patent. There is minimal contrast enhancement at the ICA origin and subsequent minimal enhancement along portions of the cervical ICA. Left carotid system: Patent. Trace calcified plaque along the proximal ICA. No stenosis. Vertebral arteries: Patent.  Right vertebral artery is dominant. Skeleton: Degenerative changes of the cervical spine. Other neck: Unremarkable. Upper chest: Included upper lungs are clear. Review of the MIP images confirms the above findings CTA HEAD Anterior circulation: Possible faint enhancement of the intracranial right internal carotid artery. There is reconstitution at the terminus. Right M1 and proximal M2 MCA are patent. There is occlusion of a small right distal M2 and proximal M3 MCA branch corresponding to abnormality on noncontrast CT. Left middle and both anterior cerebral arteries are patent. Anterior communicating artery is present. Posterior circulation: Intracranial vertebral arteries are patent. The left vertebral artery becomes small in caliber after PICA origin. Basilar artery is patent. Major cerebellar artery origins are patent. Posterior cerebral arteries are patent there is a left posterior communicating artery. Venous sinuses: Patent as allowed by contrast bolus timing. 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 This extends into the right common carotid with narrowing of the true lumen to a minimal diameter of 1.5 mm. Minimal enhancement within the right cervical and intracranial ICA. Reconstitution at the right ICA terminus. No proximal intracranial vessel occlusion. Occlusion  of small distal right M2 and proximal M3 MCA branch corresponding to noncontrast head CT finding. These results were called by telephone at the time of interpretation on 11/04/2020 at 6:59 pm to provider [facility] , who verbally acknowledged these results. Electronically Signed   By: Guadlupe Spanish M.D.   On: 11/04/2020 19:10     PHYSICAL EXAM  Temp:  [98.1 F (36.7 C)-99 F (37.2 C)] 98.7 F (37.1 C) (07/17 1145) Pulse Rate:  [50-200] 62 (07/17 0822) Resp:  [12-26] 19 (07/17 0822) BP: (108-211)/(73-116) 112/89 (07/17 0800) SpO2:  [83 %-100 %] 100 % (07/17 0822) FiO2 (%):  [40 %-100 %] 50 % (07/17 0822) Weight:  [62.2 kg] 62.2 kg (07/17 0500)  General -frail middle-aged Caucasian male,  Ophthalmologic - fundi not visualized due to noncooperation.  Cardiovascular - Regular rate and rhythm.  Neuro - idrowsy.  Sedated eyes closed but opens to stimulation not following few midline commands and with prompts on the right side.  With forced eye opening, eyes i right gaze deviation and unable to look to the left., not blinking to visual threat, doll's eyes present, not tracking, PERRL. Corneal reflex weakly present bilaterally, gag and cough absent. Breathing  over the vent.  Facial asymmetry with left lower facial weakness.  Tongue protrusion in midline. On pain stimulation, moving all extremities but right much greater than left. DTR diminished and no babinski. Sensation, coordination and gait not tested.     ASSESSMENT/PLAN [name] is a 45 y.o. male with history of anxiety, now seeing doctor for 22 years admitted for confusion, headache, blurry vision and aphasia. No tPA given due to outside window and aortic dissection.  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  Tiny punctate bilateral cerebral and cerebellar embolic infarcts MRA head and neck interval revascularization of the right ICA and MCA and right aorto to subclavian bypass with patent right carotid artery 2D Echo EF 55 to 60% EEG 11/08/2020  focal right hemispheric slowing and mild generalized slowing but no definite epileptiform activity. LDL 49 HgbA1c 5.2 Heparin subq for VTE prophylaxis No antithrombotic prior to admission, now on ASA 81 per CTS.  Ongoing aggressive stroke risk factor management Therapy recommendations: Pending Disposition: Pending  Aortic dissection CTA head and neck showed type A aortic dissection, not involving descending aorta CTS on board Status post repair MRA neck see above Respiratory failure Intubated on vent CCM on board On sedation with intermittent Versed IV  Dysphagia Currently n.p.o. Speech on board Core track placed On tube feeding  Acute blood loss anemia Hb 7.1-7.5-8.5-9.5-10.2->8.7-> 7.8 Status post PRBC during procedure Close monitoring On ASA  Delirium  Multifactorial, likely related to stroke, procedure, anemia Intubated Soft wrist restrain On precedex   Other Stroke Risk Factors   Other Active Problems Hyponatremia Na [id number]  Hospital day # 4 Continue mobilization in bed.  Therapy consults.  Speech therapy for swallow eval.  Expect slow improvement over the next few days. This patient is critically ill due to respiratory failure, aortic dissection status post surgery, large right MCA stroke, severe anemia, dysphagia and delirium and at significant risk of neurological worsening, death form  recurrent stroke, hemorrhagic conversion, CHF, cardiac arrest, seizure. This patient's care requires constant monitoring of vital signs, hemodynamics, respiratory and cardiac monitoring, review of multiple databases, neurological assessment, discussion with family, other specialists and medical decision making of high complexity. I spent 32 minutes of neurocritical care time in the care of this patient. I had long discussion with wife at bedside, updated pt current condition, treatment plan and potential prognosis, and answered all the questions.  She expressed understanding and appreciation.    Delia Heady MD Stroke Neurology 11/09/2020 1:35 PM    To contact Stroke Continuity provider, please refer to [email or web address]. After hours, contact General Neurology

## 2020-11-09 NOTE — Progress Notes (Addendum)
Inpatient Rehab Admissions Coordinator Note:   Per PT recommendation, pt was screened for CIR candidacy by Wolfgang Phoenix, MS, CCC-SLP.  At this time we are recommending an inpatient rehab consult. If pt would like to be considered, please place a IP Rehab MD consult order.  Please contact me with questions.    Wolfgang Phoenix, MS, CCC-SLP Admissions Coordinator [phone] 11/09/20 4:34 PM

## 2020-11-09 NOTE — Evaluation (Signed)
Physical Therapy Evaluation Patient Details Name: Jesse [name] MRN: [id number] DOB: 05-27-1975 Today's Date: 11/09/2020   History of Present Illness  Pt is a 45 y.o. male admitted 11/04/20 with acute onset aphasia, blurry vision. CTA revealed acute aortic dissection. Pt with worsening o2 requirements requiring intubation 7/15. 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 PMH: Anxiety, Depression, Essential hypertension, and S/P aortic dissection repair (11/05/2020).  Clinical Impression  Pt presents to PT with deficits in functional mobility, balance, power, cognition, communication, endurance, awareness. Pt under some sedation this session, likely affecting pt performance to some extent. Pt appears to have L neglect/inattention at this time with reduced awareness of LUE positioning and limited attention to L side without tactile cueing. Pt demonstrates strength against gravity with all extremities but requires physical assistance to maintain sternal precautions and safely mobilize at this time. Pt will continue to benefit from acute PT POC to improve mobility quality and reduce falls risk. PT recommends CIR placement to maximize pt function in an effort to return to independence.    Follow Up Recommendations CIR    Equipment Recommendations   (TBD)    Recommendations for Other Services Rehab consult     Precautions / Restrictions Precautions Precautions: Fall;Other (comment) Precaution Comments: L neglect/inattention Restrictions Weight Bearing Restrictions: No Other Position/Activity Restrictions: sternal precautions      Mobility  Bed Mobility Overal bed mobility: Needs Assistance Bed Mobility: Supine to Sit;Sit to Supine     Supine to sit: Total assist Sit to supine: Total assist   General bed mobility comments: pt not following commands, PT initiates and assists with bed mobility to safely guide pt to sitting  position    Transfers Overall transfer level: Needs assistance Equipment used: 1 person hand held assist Transfers: Sit to/from Stand Sit to Stand: Mod assist         General transfer comment: PT initiation of sit to stand, pt with good follow-through of LEs  Ambulation/Gait Ambulation/Gait assistance:  (deferred due to safety concerns)              Stairs            Wheelchair Mobility    Modified Rankin (Stroke Patients Only) Modified Rankin (Stroke Patients Only) Pre-Morbid Rankin Score: No symptoms Modified Rankin: Severe disability     Balance Overall balance assessment: Needs assistance Sitting-balance support: No upper extremity supported;Feet unsupported Sitting balance-Leahy Scale: Poor Sitting balance - Comments: minA without UE support, pt often attempting to utilize UE support to maintain balance, however PT assisting to maintain sternal precautions Postural control: Posterior lean   Standing balance-Leahy Scale: Poor Standing balance comment: minA posterior lean                             Pertinent Vitals/Pain Pain Assessment: Faces Faces Pain Scale: No hurt    Home Living Family/patient expects to be discharged to:: Unsure                 Additional Comments: pt with aphasia and still on some sedation, PT unable to obtain history    Prior Function Level of Independence:  (unsure, assume independent as pt was working in yard prior to symptom onset)               Hand Dominance        Extremity/Trunk Assessment   Upper Extremity Assessment Upper Extremity Assessment: RUE deficits/detail;LUE  deficits/detail RUE Deficits / Details: pt mobilizes RUE against gravity, good strength noted as pt attempting to hold himself up with UE support at edge of bed and PT having to hold UEs in pt's lap to maintain sternal precautions LUE Deficits / Details: pt mobilizes RUE against gravity, good strength noted as pt  attempting to hold himself up with UE support at edge of bed and PT having to hold UEs in pt's lap to maintain sternal precautions. Pt likely with some L neglect as L arm left behind pt initially during bed mobility, needing placement by PT.    Lower Extremity Assessment Lower Extremity Assessment: RLE deficits/detail;LLE deficits/detail RLE Deficits / Details: pt raises both LEs against gravity at bed level, stands without note of knee buckling at edge of bed, minG for standing balance after PT initiation LLE Deficits / Details: pt raises both LEs against gravity at bed level, stands without note of knee buckling at edge of bed, minG for standing balance after PT initiation    Cervical / Trunk Assessment Cervical / Trunk Assessment: Normal  Communication   Communication: Receptive difficulties;Expressive difficulties  Cognition Arousal/Alertness: Awake/alert (although still on sedative) Behavior During Therapy: Impulsive;Restless Overall Cognitive Status: Difficult to assess Area of Impairment: Following commands;Attention                   Current Attention Level: Focused   Following Commands:  (does not follow commands)              General Comments General comments (skin integrity, edema, etc.): VSS on 3L [place] during session    Exercises     Assessment/Plan    PT Assessment Patient needs continued PT services  PT Problem List Decreased strength;Decreased activity tolerance;Decreased balance;Decreased mobility;Decreased cognition;Decreased knowledge of use of DME;Decreased safety awareness;Decreased knowledge of precautions       PT Treatment Interventions DME instruction;Gait training;Stair training;Functional mobility training;Therapeutic activities;Therapeutic exercise;Balance training;Neuromuscular re-education;Cognitive remediation;Patient/family education    PT Goals (Current goals can be found in the Care Plan section)  Acute Rehab PT Goals Patient Stated  Goal: pt unable to state, PT goal to improve balance and reduce falls risk PT Goal Formulation: Patient unable to participate in goal setting Time For Goal Achievement: 11/23/20 Potential to Achieve Goals: Fair Additional Goals Additional Goal #1: Pt will attend to 5/10 objects on L side with minA for verbal cueing to improve L sided attention and awareness    Frequency Min 4X/week   Barriers to discharge        Co-evaluation               AM-PAC PT "6 Clicks" Mobility  Outcome Measure Help needed turning from your back to your side while in a flat bed without using bedrails?: Total Help needed moving from lying on your back to sitting on the side of a flat bed without using bedrails?: Total Help needed moving to and from a bed to a chair (including a wheelchair)?: Total Help needed standing up from a chair using your arms (e.g., wheelchair or bedside chair)?: A Lot Help needed to walk in hospital room?: Total Help needed climbing 3-5 steps with a railing? : Total 6 Click Score: 7    End of Session Equipment Utilized During Treatment: Oxygen Activity Tolerance: Patient tolerated treatment well Patient left: in bed;with call bell/phone within reach;with bed alarm set;with restraints reapplied Nurse Communication: Mobility status PT Visit Diagnosis: Unsteadiness on feet (R26.81);Other abnormalities of gait and mobility (R26.89);Muscle weakness (generalized) (  M62.81);Other symptoms and signs involving the nervous system (R29.898)    Time: 8341-9622 PT Time Calculation (min) (ACUTE ONLY): 19 min   Charges:   PT Evaluation $PT Eval Moderate Complexity: 1 Mod          Arlyss Gandy, PT, DPT Acute Rehabilitation Pager: [phone]   Arlyss Gandy 11/09/2020, 1:42 PM

## 2020-11-09 NOTE — Progress Notes (Signed)
      [address]       [place] [address]             [phone]                 5 Days Post-Op Procedure(s) (LRB): 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 WITH REPLACEMENT OF ASCENDING AORTA AND HEMIARCH USING HEMASHIELD PLATINUM GRAFT AND HEMASHIELD GOLD 14 X GRAFT, RESUSPENSION OF NATIVE VALVE, AORTA TO RIGHT CAROTID BYPASS, AORTA TO RIGHT SUBCLAVIAN BYPASS (N/A) TRANSESOPHAGEAL ECHOCARDIOGRAM (TEE) (N/A)   Events: Extubated Remains agitated _______________________________________________________________ Vitals: BP 112/89 (BP Location: Left Arm)   Pulse 62   Temp 98.1 F (36.7 C)   Resp 19   Ht 6\' 2"  (1.88 m)   Wt 62.2 kg   SpO2 100%   BMI 17.61 kg/m   - Neuro: arousable  - Cardiovascular: sinus brady  Drips: none.      - Pulm: EWOB.  Coarse BS   ABG    Component Value Date/Time   PHART 7.361 11/07/2020 0624   PCO2ART 48.9 (H) 11/07/2020 0624   PO2ART 57 (L) 11/07/2020 0624   HCO3 27.7 11/07/2020 0624   TCO2 29 11/07/2020 0624   ACIDBASEDEF 1.0 11/05/2020 1305   O2SAT 88.0 11/07/2020 0624    - Abd: ND - Extremity: no edema  .Intake/Output      07/16 0701 07/17 0700 07/17 0701 07/18 0700   I.V. (mL/kg) 733.1 (11.8)    NG/GT 1773.8    IV Piggyback 305.1    Total Intake(mL/kg) 2811.9 (45.2)    Urine (mL/kg/hr) 2405 (1.6)    Chest Tube     Total Output 2405    Net +406.9            _______________________________________________________________ Labs: CBC Latest Ref Rng & Units 11/07/2020 11/07/2020 11/07/2020  WBC 4.0 - 10.5 K/uL - - 7.9  Hemoglobin 13.0 - 17.0 g/dL 7.8(L) 7.8(L) 8.8(L)  Hematocrit 39.0 - 52.0 % 23.0(L) 23.0(L) 25.9(L)  Platelets 150 - 400 K/uL - - 84(L)   CMP Latest Ref Rng & Units 11/09/2020 11/08/2020 11/08/2020  Glucose 70 - 99 mg/dL 96 - 11/10/2020)  BUN 6 - 20 mg/dL 099(I) - 33(A)  Creatinine 0.61 - 1.24 mg/dL 25(K - 5.39  Sodium 7.67 - 145 mmol/L 139 - 136  Potassium 3.5 - 5.1 mmol/L 4.1 4.6  3.2(L)  Chloride 98 - 111 mmol/L 103 - 102  CO2 22 - 32 mmol/L 30 - 29  Calcium 8.9 - 10.3 mg/dL 7.9(L) - 7.8(L)  Total Protein 6.5 - 8.1 g/dL - - -  Total Bilirubin 0.3 - 1.2 mg/dL - - -  Alkaline Phos 38 - 126 U/L - - -  AST 15 - 41 U/L - - -  ALT 0 - 44 U/L - - -    CXR: -  _______________________________________________________________  Assessment and Plan: POD 5 s/p type A dissection repair  Neuro: remains confused.  Adjusting medication.  Substance abuse hx.  Appreciate neurology rects CV: sinus brady, bp controlled Pulm: on [place].  Wean as tolerated Renal: creat stable GI: on TF Heme: stable ID: [email or web address] Endo: SSI Dispo: continue ICU care.   341 11/09/2020 9:32 AM

## 2020-11-09 NOTE — Progress Notes (Signed)
NAME:  Jesse [name], MRN:  [id number], DOB:  1975/08/31, LOS: 4 ADMISSION DATE:  11/04/2020, CONSULTATION DATE:  7/14 REFERRING MD:  Dr. Cornelius Moras, CHIEF COMPLAINT:  hypoxia   History of Present Illness:   45 year old male with PMH as below, whish is significant for anxiety, depression, and tobacco abuse (Not treated medically; self medicated with ETOH). Admitted 7/12 w/ new stroke due to aortic dissection resulting in loss of flow to right carotid.,  Emergently underwent replacement of ascending aorta and proximal transverse aortic arch, Aorta to right common carotid bypass, and aorta to right subclavian bypass. Postoperatively he was transferred to the CVICU. 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   Had severe agitated delirium and then worsening oxygen requirements on am of 7/15 requiring intubation. PCCM asked to assist with care   Pertinent  Medical History   has a past medical history of Anxiety, Depression, Essential hypertension, and S/P aortic dissection repair (11/05/2020).  Significant Hospital Events: Including procedures, antibiotic start and stop dates in addition to other pertinent events   7/12 admit for aortic dissection, emergently for repair.  7/13.. Agitated at times.  7/14 agitation worsened and he became hypoxemic requiring heated high flow and NRB simultaneously. Unable to sustain sats over 85%. PCCM called emergently to bedside by bedside RN.  Intubated. New Left BBB. Placed on fent and precedex.  7/15 PCCM assuming care. Added clonazepam  7/16 extubated to high flow nasal cannula.  Still having episodes of agitation.  Interim History / Subjective:  Continues to have periodic agitation.  Core track nonfunctional.  Objective   Blood pressure 112/89, pulse 62, temperature 98.1 F (36.7 C), resp. rate 19, height 6\' 2"  (1.88 m), weight 62.2 kg, SpO2 100 %.    Vent Mode: CPAP;PSV FiO2 (%):  [40 %-100 %] 50 % Set Rate:  [15  bmp] 15 bmp Vt Set:  [570 mL] 570 mL PEEP:  [5 cmH20] 5 cmH20 Pressure Support:  [5 cmH20] 5 cmH20   Intake/Output Summary (Last 24 hours) at 11/09/2020 0954 Last data filed at 11/09/2020 0400 Gross per 24 hour  Intake 2811.87 ml  Output 2155 ml  Net 656.87 ml    Filed Weights   11/07/20 0624 11/08/20 0600 11/09/20 0500  Weight: 67.4 kg 64.3 kg 62.2 kg    Examination: General this is a disheveled 45 year old white male.  Currently extubated  HEENT: Pupils equal reactive orally intubated no JVD mucous membranes are moist Pulmonary: Chest is clear to auscultation bilaterally.  Ineffective cough.  Able to be weaned to 2 L nasal cannula.   Cardiac: Regular rate rhythm, bradycardic.  Heart sounds are unremarkable sternal dressing is intact.  No peripheral edema. Abdomen soft nontender positive bowel sounds.  Core track tube has been pulled back to 20 cm.   GU clear yellow urine has Foley catheter Neuro: Remains obtunded.  Will cough and moan periodically moves all limbs spontaneously but not to command.  Remains on Precedex infusion.   Resolved Hospital Problem list   Acute hypoxic respiratory failure  Assessment & Plan:   Resolving hypoxemic respiratory insufficiency. 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  Appears to be further complicated by history of untreated anxiety disorder Aortic dissection type A with extension into R carotid artery. S/p surgical repair Mild thrombocytopenia Right MCA stroke  Plan:  -Wean O2 as  tolerated. -Chest physiotherapy, encourage coughing as mental status allows. -His mental status needs to clear, which will likely take some time given history of prior anxiety, substance abuse, recent stroke, recent bypass run and ICU acquired delirium.  EEG did not show seizure activity. -QTC remains prolonged so we  will avoid antipsychotics.  Have increased clonazepam and started valproate in an attempt to wean Precedex off.  We will then wean enteral medications at that time.   -Resume enteral nutrition.  Core track reposition via two-step x-ray protocol  Best Practice (right click and "Reselect all SmartList Selections" daily)   Diet/type: tubefeeds DVT prophylaxis: LMWH GI prophylaxis: PPI Lines: N/A Foley:  Yes, and it is still needed Code Status:  full code Last date of multidisciplinary goals of care discussion [ ]   CRITICAL CARE Performed by:   Total critical care time: 40 minutes  Critical care time was exclusive of separately billable procedures and treating other patients.  Critical care was necessary to treat or prevent imminent or life-threatening deterioration.  Critical care was time spent personally by me on the following activities: development of treatment plan with patient and/or surrogate as well as nursing, discussions with consultants, evaluation of patient's response to treatment, examination of patient, obtaining history from patient or surrogate, ordering and performing treatments and interventions, ordering and review of laboratory studies, ordering and review of radiographic studies, pulse oximetry, re-evaluation of patient's condition and participation in multidisciplinary rounds.  Lynnell Catalan, MD [facility] ICU Physician [facility] [place] [facility]  Pager: [phone] Mobile: [phone] After hours: [phone].

## 2020-11-10 DIAGNOSIS — R410 Disorientation, unspecified: Secondary | ICD-10-CM

## 2020-11-10 LAB — GLUCOSE, CAPILLARY
Glucose-Capillary: 121 mg/dL — ABNORMAL HIGH (ref 70–99)
Glucose-Capillary: 123 mg/dL — ABNORMAL HIGH (ref 70–99)
Glucose-Capillary: 123 mg/dL — ABNORMAL HIGH (ref 70–99)
Glucose-Capillary: 64 mg/dL — ABNORMAL LOW (ref 70–99)
Glucose-Capillary: 65 mg/dL — ABNORMAL LOW (ref 70–99)
Glucose-Capillary: 65 mg/dL — ABNORMAL LOW (ref 70–99)
Glucose-Capillary: 68 mg/dL — ABNORMAL LOW (ref 70–99)
Glucose-Capillary: 98 mg/dL (ref 70–99)
Glucose-Capillary: 99 mg/dL (ref 70–99)

## 2020-11-10 LAB — CBC WITH DIFFERENTIAL/PLATELET
Abs Immature Granulocytes: 0.03 10*3/uL (ref 0.00–0.07)
Basophils Absolute: 0 10*3/uL (ref 0.0–0.1)
Basophils Relative: 0 %
Eosinophils Absolute: 0.2 10*3/uL (ref 0.0–0.5)
Eosinophils Relative: 3 %
HCT: 27.5 % — ABNORMAL LOW (ref 39.0–52.0)
Hemoglobin: 8.7 g/dL — ABNORMAL LOW (ref 13.0–17.0)
Immature Granulocytes: 1 %
Lymphocytes Relative: 17 %
Lymphs Abs: 0.9 10*3/uL (ref 0.7–4.0)
MCH: 32.6 pg (ref 26.0–34.0)
MCHC: 31.6 g/dL (ref 30.0–36.0)
MCV: 103 fL — ABNORMAL HIGH (ref 80.0–100.0)
Monocytes Absolute: 0.5 10*3/uL (ref 0.1–1.0)
Monocytes Relative: 9 %
Neutro Abs: 3.6 10*3/uL (ref 1.7–7.7)
Neutrophils Relative %: 70 %
Platelets: 138 10*3/uL — ABNORMAL LOW (ref 150–400)
RBC: 2.67 MIL/uL — ABNORMAL LOW (ref 4.22–5.81)
RDW: 13.6 % (ref 11.5–15.5)
WBC: 5.1 10*3/uL (ref 4.0–10.5)
nRBC: 0 % (ref 0.0–0.2)

## 2020-11-10 LAB — CULTURE, RESPIRATORY W GRAM STAIN

## 2020-11-10 LAB — BASIC METABOLIC PANEL
Anion gap: 10 (ref 5–15)
BUN: 24 mg/dL — ABNORMAL HIGH (ref 6–20)
CO2: 26 mmol/L (ref 22–32)
Calcium: 8.4 mg/dL — ABNORMAL LOW (ref 8.9–10.3)
Chloride: 104 mmol/L (ref 98–111)
Creatinine, Ser: 0.93 mg/dL (ref 0.61–1.24)
GFR, Estimated: >60 mL/min (ref >60)
Glucose, Bld: 121 mg/dL — ABNORMAL HIGH (ref 70–99)
Potassium: 3.6 mmol/L (ref 3.5–5.1)
Sodium: 140 mmol/L (ref 135–145)

## 2020-11-10 MED ORDER — MELATONIN 5 MG PO TABS: 5.0000 mg | ORAL_TABLET | Freq: Every evening | ORAL | Status: DC | PRN

## 2020-11-10 MED ORDER — LORAZEPAM 2 MG/ML IJ SOLN
1.0000 mg | INTRAMUSCULAR | Status: DC | PRN
  Administered 2020-11-10 – 2020-11-13 (×11): 2 mg via INTRAVENOUS
  Filled 2020-11-10 (×11): qty 1

## 2020-11-10 MED ORDER — DEXTROSE 50 % IV SOLN
INTRAVENOUS | Status: AC
  Administered 2020-11-10: 50 mL via INTRAVENOUS
  Filled 2020-11-10: qty 50

## 2020-11-10 MED ORDER — POTASSIUM CHLORIDE 20 MEQ PO PACK
20.0000 meq | PACK | ORAL | Status: AC
  Administered 2020-11-10 (×3): 20 meq
  Filled 2020-11-10 (×3): qty 1

## 2020-11-10 MED ORDER — OSMOLITE 1.5 CAL PO LIQD
1000.0000 mL | ORAL | Status: DC
  Administered 2020-11-10 – 2020-11-12 (×2): 1000 mL

## 2020-11-10 MED ORDER — DEXTROSE 50 % IV SOLN
1.0000 | Freq: Once | INTRAVENOUS | Status: AC
  Administered 2020-11-10: 50 mL via INTRAVENOUS
  Filled 2020-11-10: qty 50

## 2020-11-10 MED ORDER — DEXTROSE 10 % IV SOLN
INTRAVENOUS | Status: DC
  Administered 2020-11-12: 30 mL/h via INTRAVENOUS

## 2020-11-10 MED ORDER — MELATONIN 5 MG PO TABS
5.0000 mg | ORAL_TABLET | Freq: Every evening | ORAL | Status: DC | PRN
  Administered 2020-11-10 – 2020-11-12 (×3): 5 mg
  Filled 2020-11-10 (×3): qty 1

## 2020-11-10 NOTE — Progress Notes (Signed)
NAME:  Jesse [name], MRN:  [id number], DOB:  1975/12/11, LOS: 5 ADMISSION DATE:  11/04/2020, CONSULTATION DATE:  7/14 REFERRING MD:  Dr. Cornelius Moras, CHIEF COMPLAINT:  hypoxia   History of Present Illness:   45 year old male with PMH as below, whish is significant for anxiety, depression, and tobacco abuse (Not treated medically; self medicated with ETOH). Admitted 7/12 w/ new stroke due to aortic dissection resulting in loss of flow to right carotid.,  Emergently underwent replacement of ascending aorta and proximal transverse aortic arch, Aorta to right common carotid bypass, and aorta to right subclavian bypass. Postoperatively he was transferred to the CVICU. 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   Had severe agitated delirium and then worsening oxygen requirements on am of 7/15 requiring intubation. PCCM asked to assist with care   Pertinent  Medical History   has a past medical history of Anxiety, Depression, Essential hypertension, and S/P aortic dissection repair (11/05/2020).  Significant Hospital Events: Including procedures, antibiotic start and stop dates in addition to other pertinent events   7/12 admit for aortic dissection, emergently for repair.  7/13.. Agitated at times.  7/14 agitation worsened and he became hypoxemic requiring heated high flow and NRB simultaneously. Unable to sustain sats over 85%. PCCM called emergently to bedside by bedside RN.  Intubated. New Left BBB. Placed on fent and precedex.  7/15 PCCM assuming care. Added clonazepam  7/16 extubated to high flow nasal cannula.  Still having episodes of agitation. 7/18 intermittent agitation. Stopped precedex. Tolerating nasal cannula  Interim History / Subjective:  Sedated on exam. Received 2mg  Ativan this AM for agitation. Core track back in place. Tolerating 2L [place].   Objective   Blood pressure 137/78, pulse (!) 58, temperature 97.7 F (36.5 C), temperature source  Axillary, resp. rate (!) 23, height 6\' 2"  (1.88 m), weight 62.2 kg, SpO2 98 %.        Intake/Output Summary (Last 24 hours) at 11/10/2020 0944 Last data filed at 11/10/2020 0800 Gross per 24 hour  Intake 1697.9 ml  Output 1160 ml  Net 537.9 ml    Filed Weights   11/08/20 0600 11/09/20 0500 11/10/20 0500  Weight: 64.3 kg 62.2 kg 62.2 kg    Examination: General: Lying in bed, thin. Currently extubated. Sedated HEENT: Pupils equal and reactive, MMM Pulmonary: Chest is clear to auscultation bilaterally on anterior exam.  Ineffective cough.   Cardiac: Bradycardic, regular rhythm. No m/r/g Abdomen: Normoactive bowel sounds, soft, nontender GU: clear yellow urine has Foley catheter Neuro: Remains obtunded. Will cough and moan periodically moves all limbs spontaneously but not to command.  Remains on Precedex infusion.   Resolved Hospital Problem list   Acute hypoxic respiratory failure  Assessment & Plan:   Resolving hypoxemic respiratory insufficiency. 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  Appears to be further complicated by history of untreated anxiety disorder Aortic dissection type A with extension into R carotid artery. S/p surgical repair Mild thrombocytopenia Right MCA stroke  Plan:  -Wean O2 as tolerated -Chest physiotherapy, encourage coughing as mental status allows. -His mental status needs to clear, which will likely take some time given history of prior anxiety, substance abuse, recent stroke, recent bypass run and ICU acquired delirium.  EEG did not show seizure activity.  -QTC remains prolonged so we will avoid antipsychotics. Will re-evaluate agitation  now that precedex is off. Then wean clonazepam and Depakote as able. Ativan prn.  -Resume enteral nutrition.  Core track in place.   Best Practice (right click and  "Reselect all SmartList Selections" daily)   Diet/type: tubefeeds DVT prophylaxis: LMWH GI prophylaxis: PPI Lines: N/A Foley:  Yes, and it is still needed Code Status:  full code Last date of multidisciplinary goals of care discussion [ ]    , MS4

## 2020-11-10 NOTE — Evaluation (Signed)
Occupational Therapy Evaluation Patient Details Name: Jesse [name] MRN: [id number] DOB: 12/03/1975 Today's Date: 11/10/2020    History of Present Illness Pt is a 45 y.o. male admitted 11/04/20 with acute onset aphasia, blurry vision. CTA revealed acute aortic dissection. Pt with worsening o2 requirements requiring intubation 7/15, self extubated 7/16. 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  PMH: Anxiety, Depression, Essential hypertension.   Clinical Impression   Patient admitted for above and limited by problem list below, including impaired cognition and communication, sternal precautions, L sided weakness, impaired sensation and coordination, impaired vision with R gaze preference, L inattention, impaired balance and decreased activity tolerance.  Pt following minimal commands with increased time and multimodal cueing during functional tasks, poor awareness of situation and assist to maintain sternal precautions. He is able to squeeze with L hand, but otherwise poor attention to L UE.  Requires max assist +2 for bed mobility, mod assist +2 for transfers to recliner, and total assist for all ADLs. Pt able to report living with his spouse, but otherwise no PLOF obtained.  VSS during session. He will benefit from continued OT services while admitted and after dc at CIR level to optimize independence with ADLs and mobility. Will follow acutely.     Follow Up Recommendations  CIR;Supervision/Assistance - 24 hour    Equipment Recommendations  3 in 1 bedside commode (TBD at next venue of care)    Recommendations for Other Services Rehab consult     Precautions / Restrictions Precautions Precautions: Fall;Other (comment) Precaution Comments: L inattention Restrictions Other Position/Activity Restrictions: sternal precautions      Mobility Bed Mobility Overal bed mobility: Needs  Assistance Bed Mobility: Supine to Sit;Sit to Supine     Supine to sit: Max assist;+2 for physical assistance     General bed mobility comments: Pt does not follow commands.  Inconsistent following of cues.  Needed maximal tactile cues/direction to initiate, truncal assist forward and right    Transfers Overall transfer level: Needs assistance Equipment used: None Transfers: Sit to/from [employer] Transfers Sit to Stand: Mod assist;+2 safety/equipment Stand pivot transfers: Mod assist;+2 safety/equipment       General transfer comment: multimodal cues for direction forward and up.  stability assist during pivot steps over to the chair    Balance Overall balance assessment: Needs assistance Sitting-balance support: No upper extremity supported;Feet unsupported Sitting balance-Leahy Scale: Poor Sitting balance - Comments: pt attempting to use UE's to help balance with therapists monitoring/inhibiting pt from breaking precautions.   Standing balance support: Bilateral upper extremity supported;During functional activity Standing balance-Leahy Scale: Poor Standing balance comment: relies on BUE and external support, mild posterior lean                           ADL either performed or assessed with clinical judgement   ADL Overall ADL's : Needs assistance/impaired                                       General ADL Comments: total assist for all self care at this time     Vision   Vision Assessment?: Vision impaired- to be further tested in functional context Additional Comments: R gaze preference, able to scan to L side when in recliner (but not initally when in bed), poor sustained  attention to L side; further assessment required     Perception     Praxis      Pertinent Vitals/Pain Pain Assessment: Faces Faces Pain Scale: No hurt     Hand Dominance     Extremity/Trunk Assessment Upper Extremity Assessment Upper Extremity  Assessment: RUE deficits/detail;LUE deficits/detail;Difficult to assess due to impaired cognition RUE Deficits / Details: able to hold R UE up against gravity, but difficult to assess due to cognition LUE Deficits / Details: able to squeeze L hand when asking to "hold my hand", does not withdrawl to painful stimuli, L inattention LUE Sensation: decreased light touch;decreased proprioception LUE Coordination: decreased fine motor;decreased gross motor   Lower Extremity Assessment Lower Extremity Assessment: Defer to PT evaluation RLE Deficits / Details: pt raises both LEs against gravity at bed level, stands without note of knee buckling at edge of bed, minG for standing balance after PT initiation LLE Deficits / Details: pt raises both LEs against gravity at bed level, stands without note of knee buckling at edge of bed, minG for standing balance after PT initiation   Cervical / Trunk Assessment Cervical / Trunk Assessment: Normal   Communication Communication Communication: Receptive difficulties;Expressive difficulties   Cognition Arousal/Alertness: Awake/alert (RN turned sedation down during session) Behavior During Therapy: Impulsive;Restless Overall Cognitive Status: Difficult to assess Area of Impairment: Attention;Following commands;Awareness;Problem solving                   Current Attention Level: Focused   Following Commands:  (does not follow commands)   Awareness: Intellectual Problem Solving: Slow processing;Decreased initiation;Difficulty sequencing;Requires verbal cues;Requires tactile cues General Comments: pt with poor attention and difficulty following commands, max mulitmodal cueing for functional tasks with slow processing and decreased initation (i.e. responds better to "hold my hand" versus "squeeze my hand")   General Comments  VSS on 2L: BP 122/91 and HR 64    Exercises     Shoulder Instructions      Home Living Family/patient expects to be  discharged to:: Unsure Living Arrangements: Spouse/significant other                               Additional Comments: pt with aphasia and still on some sedation, PT/OT unable to obtain history      Prior Functioning/Environment Level of Independence:  (unsure, assume independent as pt was working in yard prior to symptom onset)        Comments: unsure, assume independent (pt working in yard prior to onset)        OT Problem List: Decreased strength;Decreased activity tolerance;Impaired balance (sitting and/or standing);Impaired vision/perception;Decreased coordination;Decreased cognition;Decreased safety awareness;Decreased knowledge of use of DME or AE;Decreased knowledge of precautions;Cardiopulmonary status limiting activity;Impaired sensation;Impaired UE functional use      OT Treatment/Interventions: Self-care/ADL training;DME and/or AE instruction;Therapeutic activities;Cognitive remediation/compensation;Patient/family education;Balance training;Visual/perceptual remediation/compensation;Therapeutic exercise;Neuromuscular education    OT Goals(Current goals can be found in the care plan section) Acute Rehab OT Goals Patient Stated Goal: unable to state OT Goal Formulation: Patient unable to participate in goal setting Time For Goal Achievement: 12/01/20 Potential to Achieve Goals: Fair  OT Frequency: Min 3X/week   Barriers to D/C:            Co-evaluation PT/OT/SLP Co-Evaluation/Treatment: Yes Reason for Co-Treatment: For patient/therapist safety;To address functional/ADL transfers;Necessary to address cognition/behavior during functional activity;Complexity of the patient's impairments (multi-system involvement) PT goals addressed during session: Mobility/safety with mobility OT goals addressed  during session: ADL's and self-care      AM-PAC OT "6 Clicks" Daily Activity     Outcome Measure Help from another person eating meals?: Total Help from  another person taking care of personal grooming?: Total Help from another person toileting, which includes using toliet, bedpan, or urinal?: Total Help from another person bathing (including washing, rinsing, drying)?: Total Help from another person to put on and taking off regular upper body clothing?: Total Help from another person to put on and taking off regular lower body clothing?: Total 6 Click Score: 6   End of Session Equipment Utilized During Treatment: Oxygen Nurse Communication: Mobility status  Activity Tolerance: Patient tolerated treatment well Patient left: in chair;with call bell/phone within reach;with chair alarm set;with nursing/sitter in room;with restraints reapplied  OT Visit Diagnosis: Other abnormalities of gait and mobility (R26.89);Muscle weakness (generalized) (M62.81);Cognitive communication deficit (R41.841);Other symptoms and signs involving cognitive function Symptoms and signs involving cognitive functions: Cerebral infarction                Time: 1049-1106 OT Time Calculation (min): 17 min Charges:  OT General Charges $OT Visit: 1 Visit OT Evaluation $OT Eval High Complexity: 1 High  Barry Brunner, OT Acute Rehabilitation Services Pager [phone] Office [phone]   Chancy Milroy 11/10/2020, 12:49 PM

## 2020-11-10 NOTE — Progress Notes (Signed)
eLink Physician-Brief Progress Note Patient Name: Jesse [name] Reason DOB: 11/15/1975 MRN: [id number]   Date of Service  11/10/2020  HPI/Events of Note  Agitation - Patient attempting to get out of bed.   eICU Interventions  Plan: Melatonin 5 mg per tube Q HS PRN sleep. Increase Ativan to 1-2 mg IV Q 2 hours PRN agitation.      Intervention Category Major Interventions: Delirium, psychosis, severe agitation - evaluation and management  Termaine Roupp Eugene 11/10/2020, 9:01 PM

## 2020-11-10 NOTE — Progress Notes (Signed)
Physical Therapy Treatment Patient Details Name: Jesse [name] MRN: [id number] DOB: [birth year] Today's Date: 11/10/2020    History of Present Illness Pt is a 45 y.o. male admitted 11/04/20 with acute onset aphasia, blurry vision. CTA revealed acute aortic dissection. Pt with worsening o2 requirements requiring intubation 7/15. 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 PMH: Anxiety, Depression, Essential hypertension, and S/P aortic dissection repair (11/05/2020).    PT Comments    Pt still having trouble with attention and following commands.  Emphasis on following commands, awareness L side, transitions to EOB, scooting and sitting balance, 2 person sit to stands, pre gait activity and transfer to the chair.  Still unable to get PLOF or home setup/resources.     Follow Up Recommendations  CIR     Equipment Recommendations  Other (comment) (TBD)    Recommendations for Other Services Rehab consult     Precautions / Restrictions Precautions Precautions: Fall;Other (comment) Precaution Comments: L /inattention Restrictions Other Position/Activity Restrictions: sternal precautions    Mobility  Bed Mobility Overal bed mobility: Needs Assistance Bed Mobility: Supine to Sit;Sit to Supine     Supine to sit: Max assist;+2 for physical assistance     General bed mobility comments: Pt does not follow commands.  Inconsistent following of cues.  Needed maximal tactile cues/direction to initiate, truncal assist forward and right    Transfers Overall transfer level: Needs assistance Equipment used: None Transfers: Sit to/from [employer] Transfers Sit to Stand: Mod assist;+2 safety/equipment Stand pivot transfers: Mod assist;+2 safety/equipment       General transfer comment: multimodal cues for direction forward and up.  stability assist during pivot steps over to the chair  Ambulation/Gait Ambulation/Gait  assistance: Mod assist;+2 physical assistance (deferred due to safety concerns)           General Gait Details: 4 steps forward to pivot to the chair.   Stairs             Wheelchair Mobility    Modified Rankin (Stroke Patients Only) Modified Rankin (Stroke Patients Only) Pre-Morbid Rankin Score: No symptoms Modified Rankin: Moderately severe disability     Balance Overall balance assessment: Needs assistance Sitting-balance support: No upper extremity supported;Feet unsupported Sitting balance-Leahy Scale: Poor Sitting balance - Comments: pt attempting to use UE's to help balance with therapists monitoring/inhibiting pt from breaking precautions.     Standing balance-Leahy Scale: Poor Standing balance comment: min mod A posterior lean.  reliant on external assist                            Cognition Arousal/Alertness: Awake/alert (although still on sedative) Behavior During Therapy: Impulsive;Restless Overall Cognitive Status: Difficult to assess Area of Impairment: Following commands;Attention                   Current Attention Level: Focused   Following Commands:  (does not follow commands)              Exercises      General Comments General comments (skin integrity, edema, etc.): vss on 2L      Pertinent Vitals/Pain Pain Assessment: Faces Faces Pain Scale: No hurt    Home Living Family/patient expects to be discharged to:: Unsure Living Arrangements: Spouse/significant other             Additional Comments: pt with aphasia and still on some sedation, PT/OT unable to obtain history  Prior Function Level of Independence:  (unsure, assume independent as pt was working in yard prior to symptom onset)          PT Goals (current goals can now be found in the care plan section) Acute Rehab PT Goals Patient Stated Goal: pt unable to state, PT goal to improve balance and reduce falls risk PT Goal Formulation: Patient  unable to participate in goal setting Time For Goal Achievement: 11/23/20 Potential to Achieve Goals: Fair Progress towards PT goals: Progressing toward goals    Frequency    Min 4X/week      PT Plan Current plan remains appropriate    Co-evaluation PT/OT/SLP Co-Evaluation/Treatment: Yes Reason for Co-Treatment: For patient/therapist safety PT goals addressed during session: Mobility/safety with mobility        AM-PAC PT "6 Clicks" Mobility   Outcome Measure  Help needed turning from your back to your side while in a flat bed without using bedrails?: A Lot Help needed moving from lying on your back to sitting on the side of a flat bed without using bedrails?: A Lot Help needed moving to and from a bed to a chair (including a wheelchair)?: A Lot Help needed standing up from a chair using your arms (e.g., wheelchair or bedside chair)?: A Lot Help needed to walk in hospital room?: Total Help needed climbing 3-5 steps with a railing? : Total 6 Click Score: 10    End of Session Equipment Utilized During Treatment: Oxygen Activity Tolerance: Patient tolerated treatment well Patient left: with call bell/phone within reach;with restraints reapplied;in chair;with chair alarm set Nurse Communication: Mobility status PT Visit Diagnosis: Unsteadiness on feet (R26.81);Other abnormalities of gait and mobility (R26.89);Muscle weakness (generalized) (M62.81);Other symptoms and signs involving the nervous system (R29.898)     Time: 1049-1106 PT Time Calculation (min) (ACUTE ONLY): 17 min  Charges:  $Neuromuscular Re-education: 8-22 mins                     11/10/2020  Jesse [name]., PT Acute Rehabilitation Services [phone]  (pager) [phone]  (office)   Jesse [name] Jesse [name] 11/10/2020, 12:31 PM

## 2020-11-10 NOTE — Progress Notes (Signed)
eLink Physician-Brief Progress Note Patient Name: Jesse [name] DOB: 08/15/1975 MRN: [id number]   Date of Service  11/10/2020  HPI/Events of Note  Hypoglycemia - 3 episodes 65, 64 and 68. Already given D50.  eICU Interventions  Plan: D10W to run IV at 40 mL/hour.      Intervention Category Major Interventions: Other:  [name] 11/10/2020, 8:04 PM

## 2020-11-10 NOTE — Progress Notes (Signed)
Inpatient Rehab Admissions Coordinator:   Saw pt at bedside with RN.  Per RN he has had a better day today, but still requiring IV ativan for agitation.  I spoke to pt's spouse over the phone to explain CIR recommendations, goals, and expectations for CIR stay.  She is hopeful for CIR admission and confirms that she can provide 24/7 care for him at discharge.  I reviewed cost of CIR and will provide a printed copy of an estimate to his bedside.  She is agreeable to proceeding with CIR admission when able to tolerate/medically cleared and bed available.  I will continue to follow.    Estill Dooms, PT, DPT Admissions Coordinator [phone] 11/10/20  3:49 PM

## 2020-11-10 NOTE — Progress Notes (Signed)
eLink Physician-Brief Progress Note Patient Name: Jesse [name] DOB: 07/25/1975 MRN: [id number]   Date of Service  11/10/2020  HPI/Events of Note  Hypoglycemia - Blood glucose = 63.   eICU Interventions  Plan: D50 1 amp IV now per hypoglycemia orders. Increase D10W IV infusion to 60 mL/hour.     Intervention Category Major Interventions: Other:  [name] 11/10/2020, 11:58 PM

## 2020-11-10 NOTE — Progress Notes (Signed)
6 Days Post-Op Procedure(s) (LRB): 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 WITH REPLACEMENT OF ASCENDING AORTA AND HEMIARCH USING HEMASHIELD PLATINUM GRAFT AND HEMASHIELD GOLD 14 X GRAFT, RESUSPENSION OF NATIVE VALVE, AORTA TO RIGHT CAROTID BYPASS, AORTA TO RIGHT SUBCLAVIAN BYPASS (N/A) TRANSESOPHAGEAL ECHOCARDIOGRAM (TEE) (N/A) Subjective: Sedated at this time. Still on Precedex at 0.6 and received some Ativan this am.  Had some HTN yesterday requiring prn hydralazine.  Objective: Vital signs in last 24 hours: Temp:  [98 F (36.7 C)-98.7 F (37.1 C)] 98.4 F (36.9 C) (07/18 0347) Pulse Rate:  [54-159] 56 (07/18 0700) Cardiac Rhythm: Sinus bradycardia (07/18 0400) Resp:  [11-25] 19 (07/18 0700) BP: (98-170)/(64-116) 117/79 (07/18 0700) SpO2:  [94 %-100 %] 98 % (07/18 0700) FiO2 (%):  [50 %] 50 % (07/17 0822) Weight:  [62.2 kg] 62.2 kg (07/18 0500)  Hemodynamic parameters for last 24 hours:    Intake/Output from previous day: 07/17 0701 - 07/18 0700 In: 1879.2 [I.V.:340.5; NG/GT:1538.8] Out: 1255 [Urine:1255] Intake/Output this shift: No intake/output data recorded.  General appearance: in bed, restraints on wrists for safety. Neurologic: Not responding to me but just sedated. Reportedly moving all extremities with right stronger than left, following some commands and stood with PT yesterday. Heart: regular rate and rhythm, S1, S2 normal, no murmur Lungs: clear to auscultation bilaterally Abdomen: soft, non-tender; bowel sounds normal Extremities: extremities normal, atraumatic, no cyanosis or edema Wound: chest incision healing well.  Lab Results: No results for input(s): WBC, HGB, HCT, PLT in the last 72 hours. BMET:  Recent Labs    11/09/20 0101 11/10/20 0603  NA 139 140  K 4.1 3.6  CL 103 104  CO2 30 26  GLUCOSE 96 121*  BUN 24* 24*  CREATININE 0.98 0.93  CALCIUM 7.9* 8.4*    PT/INR: No results for input(s): LABPROT, INR in the last 72  hours. ABG    Component Value Date/Time   PHART 7.361 11/07/2020 0624   HCO3 27.7 11/07/2020 0624   TCO2 29 11/07/2020 0624   ACIDBASEDEF 1.0 11/05/2020 1305   O2SAT 88.0 11/07/2020 0624   CBG (last 3)  Recent Labs    11/09/20 1925 11/09/20 2340 11/10/20 0336  GLUCAP 106* 130* 121*    Assessment/Plan: S/P Procedure(s) (LRB): 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 WITH REPLACEMENT OF ASCENDING AORTA AND HEMIARCH USING HEMASHIELD PLATINUM GRAFT AND HEMASHIELD GOLD 14 X GRAFT, RESUSPENSION OF NATIVE VALVE, AORTA TO RIGHT CAROTID BYPASS, AORTA TO RIGHT SUBCLAVIAN BYPASS (N/A) TRANSESOPHAGEAL ECHOCARDIOGRAM (TEE) (N/A)  POD 6 Hemodynamics stable for the most part with occasional HTN requiring prn hydralazine.  Respiratory status stable off vent on 2L.  Tolerating tube feeds  Delirium multifactorial due to preop substance abuse, stroke, pump run with circ arrest, ICU. This should gradually get better. CCM has been adjusting sedation.  Acute postop blood loss anemia: stable.  ID: no fever, nl WBC, sputum culture pending.  Renal function normal.   Will plan CIR consult once his mental status improves so that he can participate in the evaluation.    LOS: 5 days    Alleen Borne 11/10/2020

## 2020-11-10 NOTE — Progress Notes (Signed)
Patient ID: Jesse [name], male   DOB: [birth year], 45 y.o.   MRN: [id number] T[facility] Evening Rounds:  Hypertensive this afternoon. 170-180's. Prn hydralazine and labetalol in orders. Sats 94% 2L Remains on precedex 0.6.  Seen by PT/OT and CIR.

## 2020-11-10 NOTE — Progress Notes (Signed)
STROKE TEAM PROGRESS NOTE   SUBJECTIVE (INTERVAL HISTORY)  He is sedated with Precedex and had received Ativan earlier.  S.  He can be aroused and follows few simple midline and right-sided commands.  He is able to withdraw the left leg to noxious stimuli but left upper extremity remains paralyzed Vital signs are stable.  .  Neurological exam is unchanged OBJECTIVE Temp:  [97.7 F (36.5 C)-98.4 F (36.9 C)] 98.3 F (36.8 C) (07/18 1100) Pulse Rate:  [54-159] 67 (07/18 1100) Cardiac Rhythm: Sinus bradycardia (07/18 0800) Resp:  [11-25] 21 (07/18 1100) BP: (98-170)/(64-116) 122/91 (07/18 1100) SpO2:  [94 %-100 %] 97 % (07/18 1100) Weight:  [62.2 kg] 62.2 kg (07/18 0500)  Recent Labs  Lab 11/09/20 1925 11/09/20 2340 11/10/20 0336 11/10/20 0757 11/10/20 1128  GLUCAP 106* 130* 121* 98 99   Recent Labs  Lab 11/06/20 2003 11/06/20 2029 11/07/20 0031 11/07/20 0422 11/07/20 0624 11/07/20 1350 11/07/20 2111 11/08/20 0443 11/08/20 1648 11/09/20 0101 11/10/20 0603  NA 134*   < > 136 137 136  --   --  136  --  139 140  K 3.4*   < > 3.4* 3.1* 3.4*  --  3.5 3.2* 4.6 4.1 3.6  CL 96*  --  100  --   --   --   --  102  --  103 104  CO2 28  --  30  --   --   --   --  29  --  30 26  GLUCOSE 125*  --  112*  --   --   --   --  112*  --  96 121*  BUN 15  --  17  --   --   --   --  25*  --  24* 24*  CREATININE 1.03  --  1.20  --   --   --   --  1.11  --  0.98 0.93  CALCIUM 8.0*  --  7.8*  --   --   --   --  7.8*  --  7.9* 8.4*  MG 2.1  --  1.9  --   --  2.3 2.2 2.3 2.3  --   --   PHOS 3.5  --   --   --   --  3.3 3.3 2.9 2.9  --   --    < > = values in this interval not displayed.   Recent Labs  Lab 11/04/20 1837 11/07/20 0031  AST 20 40  ALT 15 12  ALKPHOS 55 41  BILITOT 0.7 0.4  PROT 6.3* 4.8*  ALBUMIN 3.7 2.5*   Recent Labs  Lab 11/04/20 1837 11/04/20 1840 11/05/20 1208 11/05/20 1305 11/06/20 0510 11/06/20 2003 11/06/20 2029 11/07/20 0031 11/07/20 0422 11/07/20 0624  11/10/20 0603  WBC 12.7*   < > 9.6  --  8.2 7.8  --  7.9  --   --  5.1  NEUTROABS 10.8*  --   --   --   --   --   --   --   --   --  3.6  HGB 14.5   < > 9.8*   < > 8.7* 9.7* 8.8* 8.8* 7.8* 7.8* 8.7*  HCT 42.4   < > 29.4*   < > 26.0* 29.1* 26.0* 25.9* 23.0* 23.0* 27.5*  MCV 97.2   < > 99.3  --  102.4* 98.6  --  100.0  --   --  103.0*  PLT 170   < > 121*  --  90* 94*  --  84*  --   --  138*   < > = values in this interval not displayed.   No results for input(s): CKTOTAL, CKMB, CKMBINDEX, TROPONINI in the last 168 hours. No results for input(s): LABPROT, INR in the last 72 hours.  No results for input(s): COLORURINE, LABSPEC, PHURINE, GLUCOSEU, HGBUR, BILIRUBINUR, KETONESUR, PROTEINUR, UROBILINOGEN, NITRITE, LEUKOCYTESUR in the last 72 hours.  Invalid input(s): APPERANCEUR     Component Value Date/Time   CHOL 112 11/06/2020 0510   TRIG 196 (H) 11/06/2020 0510   HDL 24 (L) 11/06/2020 0510   CHOLHDL 4.7 11/06/2020 0510   VLDL 39 11/06/2020 0510   LDLCALC 49 11/06/2020 0510   Lab Results  Component Value Date   HGBA1C 5.2 11/06/2020   No results found for: LABOPIA, COCAINSCRNUR, LABBENZ, AMPHETMU, THCU, LABBARB  Recent Labs  Lab 11/04/20 1837  ETH <10    I have personally reviewed the radiological images below and agree with the radiology interpretations.  DG Chest 1 View  Result Date: 11/07/2020 CLINICAL DATA:  45 year old male with code stroke. Attempted to pull out endotracheal tube. EXAM: CHEST  1 VIEW COMPARISON:  Chest x-ray 11/06/2020. FINDINGS: An endotracheal tube is in place with tip 8.9 cm above the carina. A nasogastric tube is seen extending into the stomach, however, the tip of the nasogastric tube extends below the lower margin of the image. There is a surgical drain projecting over the medial aspect of the right hemithorax, either a mediastinal drain or medially placed right-sided chest tube. Lung volumes are normal. Opacity in the left lower lobe, favored to  represent postoperative atelectasis. Small left pleural effusion. Right lung is clear. No right pleural effusion. No appreciable pneumothorax. No evidence of pulmonary edema. Heart size is normal. Mediastinal contours are within normal limits. Surgical clips are noted to the right-side of the thoracic inlet. IMPRESSION: 1. Support apparatus, as above. 2. Probable atelectasis in the left lower lobe. 3. Small left pleural effusion. Electronically Signed   By: Trudie Reed M.D.   On: 11/07/2020 06:31   DG Abd 1 View  Result Date: 11/09/2020 CLINICAL DATA:  NG tube placement EXAM: ABDOMEN - 1 VIEW COMPARISON:  11/07/2020 FINDINGS: Limited radiograph of the lower chest and upper abdomen was obtained for the purposes of enteric tube localization. Enteric tube is seen coursing below the diaphragm with distal tip terminating within the expected location of the gastric body. IMPRESSION: Enteric tube tip terminating within the gastric body. Electronically Signed   By: Duanne Guess D.O.   On: 11/09/2020 10:52   CT HEAD WO CONTRAST  Result Date: 11/06/2020 CLINICAL DATA:  45 year old male code stroke presentation. Type a aortic dissection. Right carotid involvement and occlusion, distal right M2 occlusion. Subsequent encounter. EXAM: CT HEAD WITHOUT CONTRAST TECHNIQUE: Contiguous axial images were obtained from the base of the skull through the vertex without intravenous contrast. COMPARISON:  CTA head and neck, and CT headi 11/04/2020 FINDINGS: Brain: Confluent cytotoxic edema throughout a roughly 8 cm area of the posterior right hemisphere involving the posterior right MCA and right MCA/PCA watershed area. See sagittal image 15. Mild regional mass effect. Mild mass effect on the right lateral ventricle and trace leftward midline shift (series 3, image 23). No hemorrhagic transformation. Basilar cisterns remain patent. No ventriculomegaly. Deep gray nuclei appear stable. Left hemisphere and posterior fossa  gray-white matter differentiation is stable. Vascular: No large  vessel hyperdensity. Skull: Negative. Sinuses/Orbits: Visualized paranasal sinuses and mastoids are stable and well aerated. Other: Visualized orbits and scalp soft tissues are within normal limits. IMPRESSION: 1. Relatively large infarct with cytotoxic edema in the posterior Right MCA and MCA/PCA watershed territories. Mild mass effect including trace leftward midline shift. No hemorrhagic transformation. 2. Preserved gray-white matter differentiation elsewhere. Electronically Signed   By: Odessa Fleming M.D.   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 Status post graft repair. EXAM: MRI HEAD WITHOUT CONTRAST MRA HEAD WITHOUT CONTRAST MRA NECK WITHOUT AND WITH CONTRAST TECHNIQUE: Multiplanar, multi-echo pulse sequences of the brain and surrounding structures were acquired without intravenous contrast. Angiographic images of the Circle of Willis were acquired using MRA technique without intravenous contrast. Angiographic images of the neck were acquired using MRA technique without and with intravenous contrast. Carotid stenosis measurements (when applicable) are obtained utilizing NASCET criteria, using the distal internal carotid diameter as the denominator. CONTRAST:  6.66mL GADAVIST GADOBUTROL 1 MMOL/ML IV SOLN COMPARISON:  Prior CTs from 11/06/2020 and 11/04/2020 FINDINGS: MRI HEAD FINDINGS Brain: Cerebral volume within normal limits. Few scattered subcentimeter foci of T2/FLAIR hyperintensity noted involving the periventricular and deep white matter both cerebral hemispheres, consistent with chronic microvascular ischemic disease, mild in nature. Moderately large area of restricted diffusion involving the right temporal occipital and parietal  region, consistent with right MCA and MCA/PCA watershed infarct, relatively stable from prior CT. Associated gyral swelling and edema with regional mass effect. Associated right-to-left shift now measures 5 mm, slightly increased from prior. No associated hemorrhage. Multiple additional scattered subcentimeter acute ischemic infarcts noted involving the bilateral cerebral hemispheres, embolic in nature. Largest of these additional foci seen at the high posterior right parietal region and measures 8 mm (series 5, image 94). Patchy involvement of the basal ganglia/corona radiata noted. Few additional punctate ischemic infarcts noted involving the right cerebellum. No associated hemorrhage or mass effect about these additional infarcts. Gray-white matter differentiation otherwise maintained. No other areas of chronic cortical infarction. No acute intracranial hemorrhage. Few scattered punctate chronic micro hemorrhages noted involving both cerebral hemispheres, likely related to underlying hypertension. No mass lesion. No hydrocephalus or ventricular trapping. Basilar cisterns remain patent. No extra-axial fluid collection. Pituitary gland suprasellar region normal. Midline structures intact. Vascular: Major intracranial vascular flow voids are maintained. Skull and upper cervical spine: Craniocervical junction within normal limits. Bone marrow signal intensity normal. No scalp soft tissue abnormality. Sinuses/Orbits: Globes and orbital soft tissues within normal limits. Scattered mucosal thickening noted throughout the ethmoidal air cells and maxillary sinuses. Fluid seen within the nasopharynx with small to moderate bilateral mastoid effusions. Patient is intubated. Other: None. MRA HEAD FINDINGS Anterior circulation: Widely patent antegrade flow seen within the distal cervical segments of both internal carotid arteries, with interval re-establishment of flow within the cervical right ICA since prior. Both ICAs  remain patent to the termini without definite stenosis. Apparent focal stenoses involving the anterior genus of both cavernous ICAs on MIP reconstructions favored to be artifactual in nature. A1 segments patent bilaterally. Normal anterior communicating artery complex. Anterior cerebral arteries patent to their distal aspects without stenosis. No M1 stenosis or occlusion. Normal MCA bifurcations. Previously seen right MCA branch occlusions not definitely seen by MRA. Right MCA branches now appear well perfused and symmetric. Posterior circulation:  Both V4 segments patent to the vertebrobasilar junction without stenosis. Right vertebral artery dominant. Partially visualized PICA patent. Basilar patent to its distal aspect without stenosis. Superior cerebellar arteries patent bilaterally. Right PCA primarily supplied via the basilar. Fetal type origin of the left PCA. Both PCAs remain well perfused to their distal aspects. Anatomic variants: Fetal type origin of the left PCA.  No aneurysm. 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 The visualized native arch is normal in caliber. The native left common carotid and subclavian arteries arise directly from the native arch. There has also been interval performance of an aorta to right subclavian bypass that supplies the right common and subclavian arteries. No visible stenosis or adverse features. Right carotid system: Right CCA now seen arising from the right subclavian bypass. Previously seen right CCA dissection has been repaired, with the right CCA now seen widely patent to the bifurcation. No significant atheromatous narrowing or irregularity about the right carotid bulb. Right ICA now patent and well perfused to the skull base without stenosis or occlusion. No MRA evidence for residual dissection. Left carotid system: Left CCA arises from the repaired native aortic  arch and is widely patent to the bifurcation. No significant narrowing about the left carotid bulb. Left ICA patent distally to the skull base without stenosis, evidence for dissection or occlusion. Vertebral arteries: Left vertebral artery arises from the proximal left subclavian artery, just beyond its takeoff from the native aortic arch. Right vertebral artery arises from the right subclavian bypass. 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 Vertebral arteries themselves are patent within the neck without stenosis, evidence for dissection, or occlusion. Right vertebral artery dominant. Other: None. IMPRESSION: MRI HEAD IMPRESSION: 1. 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 Associated regional mass effect with 5 mm of right-to-left shift, slightly increased from prior. 2. Additional scattered subcentimeter acute ischemic infarcts involving the bilateral cerebral hemispheres and right cerebellum, embolic in nature. No associated hemorrhage or mass effect. 3. No other acute intracranial abnormality. MRA HEAD IMPRESSION: 1. Interval re-establishment of flow within the right ICA, now patent to the terminus. Right MCA well perfused, with no visible persistent downstream occlusion evident by MRA. 2. Otherwise stable and unremarkable intracranial MRA. MRA NECK IMPRESSION: 1. 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 2. Right carotid artery system supplied via the right subclavian bypass, and is now widely patent without residual dissection or other abnormality. 3. Left carotid artery system supplied via the native aortic arch, and remains widely patent without stenosis or other abnormality. 4. 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 Follow-up examination with dedicated CTA could be performed for further evaluation of this finding as warranted. 5. Both vertebral arteries remain widely patent within the neck. Right vertebral artery dominant. Electronically Signed   By: Rise Mu M.D.   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 Status post graft repair.  EXAM: MRI HEAD WITHOUT CONTRAST MRA HEAD WITHOUT CONTRAST MRA NECK WITHOUT AND WITH CONTRAST TECHNIQUE: Multiplanar, multi-echo pulse sequences of the brain and surrounding structures were acquired without intravenous contrast. Angiographic images of the Circle of Willis were acquired using MRA technique without intravenous contrast. Angiographic images of the neck were acquired using MRA technique without and with intravenous contrast. Carotid stenosis measurements (when applicable) are obtained utilizing NASCET criteria, using the distal internal carotid diameter as the denominator. CONTRAST:  6.49mL GADAVIST GADOBUTROL 1 MMOL/ML IV SOLN COMPARISON:  Prior CTs from 11/06/2020 and 11/04/2020 FINDINGS: MRI HEAD FINDINGS Brain: Cerebral volume within normal limits. Few scattered subcentimeter foci of T2/FLAIR hyperintensity noted involving the periventricular and deep white matter both cerebral hemispheres, consistent with chronic microvascular ischemic disease, mild in nature. Moderately large area of restricted diffusion involving the right temporal occipital and parietal region, consistent with right MCA and MCA/PCA watershed infarct, relatively stable from prior CT. Associated gyral swelling and edema with regional mass effect. Associated  right-to-left shift now measures 5 mm, slightly increased from prior. No associated hemorrhage. Multiple additional scattered subcentimeter acute ischemic infarcts noted involving the bilateral cerebral hemispheres, embolic in nature. Largest of these additional foci seen at the high posterior right parietal region and measures 8 mm (series 5, image 94). Patchy involvement of the basal ganglia/corona radiata noted. Few additional punctate ischemic infarcts noted involving the right cerebellum. No associated hemorrhage or mass effect about these additional infarcts. Gray-white matter differentiation otherwise maintained. No other areas of chronic cortical infarction. No acute intracranial hemorrhage. Few scattered punctate chronic micro hemorrhages noted involving both cerebral hemispheres, likely related to underlying hypertension. No mass lesion. No hydrocephalus or ventricular trapping. Basilar cisterns remain patent. No extra-axial fluid collection. Pituitary gland suprasellar region normal. Midline structures intact. Vascular: Major intracranial vascular flow voids are maintained. Skull and upper cervical spine: Craniocervical junction within normal limits. Bone marrow signal intensity normal. No scalp soft tissue abnormality. Sinuses/Orbits: Globes and orbital soft tissues within normal limits. Scattered mucosal thickening noted throughout the ethmoidal air cells and maxillary sinuses. Fluid seen within the nasopharynx with small to moderate bilateral mastoid effusions. Patient is intubated. Other: None. MRA HEAD FINDINGS Anterior circulation: Widely patent antegrade flow seen within the distal cervical segments of both internal carotid arteries, with interval re-establishment of flow within the cervical right ICA since prior. Both ICAs remain patent to the termini without definite stenosis. Apparent focal stenoses involving the anterior genus of both cavernous ICAs on MIP reconstructions favored to be  artifactual in nature. A1 segments patent bilaterally. Normal anterior communicating artery complex. Anterior cerebral arteries patent to their distal aspects without stenosis. No M1 stenosis or occlusion. Normal MCA bifurcations. Previously seen right MCA branch occlusions not definitely seen by MRA. Right MCA branches now appear well perfused and symmetric. Posterior circulation: Both V4 segments patent to the vertebrobasilar junction without stenosis. Right vertebral artery dominant. Partially visualized PICA patent. Basilar patent to its distal aspect without stenosis. Superior cerebellar arteries patent bilaterally. Right PCA primarily supplied via the basilar. Fetal type origin of the left PCA. Both PCAs remain well perfused to their distal aspects. Anatomic variants: Fetal type origin of the left PCA.  No aneurysm. 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 The visualized native arch is normal in caliber. The native left common carotid and subclavian arteries arise directly from the native arch. There has also been interval performance of an  aorta to right subclavian bypass that supplies the right common and subclavian arteries. No visible stenosis or adverse features. Right carotid system: Right CCA now seen arising from the right subclavian bypass. Previously seen right CCA dissection has been repaired, with the right CCA now seen widely patent to the bifurcation. No significant atheromatous narrowing or irregularity about the right carotid bulb. Right ICA now patent and well perfused to the skull base without stenosis or occlusion. No MRA evidence for residual dissection. Left carotid system: Left CCA arises from the repaired native aortic arch and is widely patent to the bifurcation. No significant narrowing about the left carotid bulb. Left ICA patent distally to the skull base without stenosis,  evidence for dissection or occlusion. Vertebral arteries: Left vertebral artery arises from the proximal left subclavian artery, just beyond its takeoff from the native aortic arch. Right vertebral artery arises from the right subclavian bypass. 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 Vertebral arteries themselves are patent within the neck without stenosis, evidence for dissection, or occlusion. Right vertebral artery dominant. Other: None. IMPRESSION: MRI HEAD IMPRESSION: 1. 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 Associated regional mass effect with 5 mm of right-to-left shift, slightly increased from prior. 2. Additional scattered subcentimeter acute ischemic infarcts involving the bilateral cerebral hemispheres and right cerebellum, embolic in nature. No associated hemorrhage or mass effect. 3. No other acute intracranial abnormality. MRA HEAD IMPRESSION: 1. Interval re-establishment of flow within the right ICA, now patent to the terminus. Right MCA well perfused, with no visible persistent downstream occlusion evident by MRA. 2. Otherwise stable and unremarkable intracranial MRA. MRA NECK IMPRESSION: 1. 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 2. Right carotid artery system supplied via the right subclavian bypass, and is now widely patent without residual dissection or other abnormality. 3. Left carotid artery system supplied via the native aortic arch, and remains widely patent without stenosis or other abnormality. 4. 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 Follow-up examination with  dedicated CTA could be performed for further evaluation of this finding as warranted. 5. Both vertebral arteries remain widely patent within the neck. Right vertebral artery dominant. Electronically Signed   By: Rise Mu M.D.   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 Status post graft repair. EXAM: MRI HEAD WITHOUT CONTRAST MRA HEAD WITHOUT CONTRAST MRA NECK WITHOUT AND WITH CONTRAST TECHNIQUE: Multiplanar, multi-echo pulse sequences of the brain and surrounding structures were acquired without intravenous contrast. Angiographic images of the Circle of Willis were acquired using MRA technique without intravenous contrast. Angiographic images of the neck were acquired using MRA technique without and with intravenous contrast. Carotid stenosis measurements (when applicable) are obtained utilizing NASCET criteria, using the distal internal carotid diameter as the denominator. CONTRAST:  6.56mL GADAVIST GADOBUTROL 1 MMOL/ML IV SOLN COMPARISON:  Prior CTs from 11/06/2020 and 11/04/2020 FINDINGS: MRI HEAD FINDINGS Brain: Cerebral volume within normal limits. Few scattered subcentimeter foci of T2/FLAIR hyperintensity noted involving the periventricular  and deep white matter both cerebral hemispheres, consistent with chronic microvascular ischemic disease, mild in nature. Moderately large area of restricted diffusion involving the right temporal occipital and parietal region, consistent with right MCA and MCA/PCA watershed infarct, relatively stable from prior CT. Associated gyral swelling and edema with regional mass effect. Associated right-to-left shift now measures 5 mm, slightly increased from prior. No associated hemorrhage. Multiple additional scattered subcentimeter acute ischemic infarcts noted  involving the bilateral cerebral hemispheres, embolic in nature. Largest of these additional foci seen at the high posterior right parietal region and measures 8 mm (series 5, image 94). Patchy involvement of the basal ganglia/corona radiata noted. Few additional punctate ischemic infarcts noted involving the right cerebellum. No associated hemorrhage or mass effect about these additional infarcts. Gray-white matter differentiation otherwise maintained. No other areas of chronic cortical infarction. No acute intracranial hemorrhage. Few scattered punctate chronic micro hemorrhages noted involving both cerebral hemispheres, likely related to underlying hypertension. No mass lesion. No hydrocephalus or ventricular trapping. Basilar cisterns remain patent. No extra-axial fluid collection. Pituitary gland suprasellar region normal. Midline structures intact. Vascular: Major intracranial vascular flow voids are maintained. Skull and upper cervical spine: Craniocervical junction within normal limits. Bone marrow signal intensity normal. No scalp soft tissue abnormality. Sinuses/Orbits: Globes and orbital soft tissues within normal limits. Scattered mucosal thickening noted throughout the ethmoidal air cells and maxillary sinuses. Fluid seen within the nasopharynx with small to moderate bilateral mastoid effusions. Patient is intubated. Other: None. MRA HEAD FINDINGS Anterior circulation: Widely patent antegrade flow seen within the distal cervical segments of both internal carotid arteries, with interval re-establishment of flow within the cervical right ICA since prior. Both ICAs remain patent to the termini without definite stenosis. Apparent focal stenoses involving the anterior genus of both cavernous ICAs on MIP reconstructions favored to be artifactual in nature. A1 segments patent bilaterally. Normal anterior communicating artery complex. Anterior cerebral arteries patent to their distal aspects without stenosis.  No M1 stenosis or occlusion. Normal MCA bifurcations. Previously seen right MCA branch occlusions not definitely seen by MRA. Right MCA branches now appear well perfused and symmetric. Posterior circulation: Both V4 segments patent to the vertebrobasilar junction without stenosis. Right vertebral artery dominant. Partially visualized PICA patent. Basilar patent to its distal aspect without stenosis. Superior cerebellar arteries patent bilaterally. Right PCA primarily supplied via the basilar. Fetal type origin of the left PCA. Both PCAs remain well perfused to their distal aspects. Anatomic variants: Fetal type origin of the left PCA.  No aneurysm. 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 The visualized native arch is normal in caliber. The native left common carotid and subclavian arteries arise directly from the native arch. There has also been interval performance of an aorta to right subclavian bypass that supplies the right common and subclavian arteries. No visible stenosis or adverse features. Right carotid system: Right CCA now seen arising from the right subclavian bypass. Previously seen right CCA dissection has been repaired, with the right CCA now seen widely patent to the bifurcation. No significant atheromatous narrowing or irregularity about the right carotid bulb. Right ICA now patent and well perfused to the skull base without stenosis or occlusion. No MRA evidence for residual dissection. Left carotid system: Left CCA arises from the repaired native aortic arch and is widely patent to the bifurcation. No significant narrowing about the left carotid bulb. Left ICA patent distally to the skull base  without stenosis, evidence for dissection or occlusion. Vertebral arteries: Left vertebral artery arises from the proximal left subclavian artery, just beyond its takeoff from the native aortic arch.  Right vertebral artery arises from the right subclavian bypass. 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 Vertebral arteries themselves are patent within the neck without stenosis, evidence for dissection, or occlusion. Right vertebral artery dominant. Other: None. IMPRESSION: MRI HEAD IMPRESSION: 1. 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 Associated regional mass effect with 5 mm of right-to-left shift, slightly increased from prior. 2. Additional scattered subcentimeter acute ischemic infarcts involving the bilateral cerebral hemispheres and right cerebellum, embolic in nature. No associated hemorrhage or mass effect. 3. No other acute intracranial abnormality. MRA HEAD IMPRESSION: 1. Interval re-establishment of flow within the right ICA, now patent to the terminus. Right MCA well perfused, with no visible persistent downstream occlusion evident by MRA. 2. Otherwise stable and unremarkable intracranial MRA. MRA NECK IMPRESSION: 1. 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 2. Right carotid artery system supplied via the right subclavian bypass, and is now widely patent without residual dissection or other abnormality. 3. Left carotid artery system supplied via the native aortic arch, and remains widely patent without stenosis or other abnormality. 4. 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 Follow-up examination with dedicated CTA could be performed for further evaluation of this finding as warranted. 5. Both vertebral arteries remain widely patent within the neck. Right vertebral artery dominant.  Electronically Signed   By: Rise MuBenjamin  McClintock M.D.   On: 11/07/2020 20:51   DG CHEST PORT 1 VIEW  Result Date: 11/09/2020 CLINICAL DATA:  NG tube placement EXAM: PORTABLE CHEST 1 VIEW COMPARISON:  11/08/2020 FINDINGS: Enteric tube tip terminates at the level of the distal esophagus. Stable cardiomediastinal contours. Hazy bibasilar opacities, slightly improved from prior. No pneumothorax is seen, although the lung apices were excluded from the field of view. IMPRESSION: 1. Enteric tube tip terminates at the level of the distal esophagus. Subsequent abdominal radiograph in PACS demonstrates interval advancement. 2. Improving aeration of the lung bases. Electronically Signed   By: Duanne GuessNicholas  Plundo D.O.   On: 11/09/2020 10:52   DG Chest Port 1 View  Result Date: 11/08/2020 CLINICAL DATA:  Self extubation EXAM: PORTABLE CHEST 1 VIEW COMPARISON:  11/07/2020 FINDINGS: Single frontal view of the chest demonstrates interval removal of the endotracheal tube and enteric catheter. There is a linear radiopaque structure overlying the trachea at the C7 level, indeterminate in nature. Cardiac silhouette is unremarkable. Bibasilar veiling opacities are identified, left greater than right, consistent with underlying consolidation and/or effusion. No pneumothorax. IMPRESSION: 1. Progressive bibasilar veiling opacities, left greater than right, consistent with underlying consolidation and effusions. 2. Interval removal of the endotracheal and enteric catheters. A short linear radiodensity projects over the trachea at the C7 level, of uncertain etiology. Electronically Signed   By: Sharlet SalinaMichael  Brown M.D.   On: 11/08/2020 17:06   DG CHEST PORT 1 VIEW  Result Date: 11/06/2020 CLINICAL DATA:  45 year old male status post intubation. EXAM: PORTABLE CHEST 1 VIEW COMPARISON:  Chest radiograph dated 11/06/2020. FINDINGS: Endotracheal tube with tip approximately 5.5 cm above the carina.  Interval removal of the right IJ catheter.  Two inferiorly accessed mediastinal drains again noted. Probable small left pleural effusion. Right infrahilar hazy density, possibly atelectasis. No obvious pneumothorax. Stable cardiomegaly. No acute osseous pathology. IMPRESSION: 1. Interval removal of the right IJ catheter. 2. Endotracheal tube above the carina. 3. Probable small left pleural effusion. Electronically Signed   By: Elgie Collard M.D.   On: 11/06/2020 20:42   DG Chest Port 1 View  Addendum Date: 11/06/2020   ADDENDUM REPORT: 11/06/2020 09:13 ADDENDUM: Also with slight increased retrocardiac opacification and RIGHT basilar opacification since prior study. These findings and findings in the initial report were discussed with the provider as outlined below. These results were called by telephone at the time of interpretation on 11/06/2020 at 9:13 am to provider Tressie Stalker , who verbally acknowledged these results. Electronically Signed   By: Donzetta Kohut M.D.   On: 11/06/2020 09:13   Result Date: 11/06/2020 CLINICAL DATA:  Post aortic dissection repair in a 45 year old male. EXAM: PORTABLE CHEST 1 VIEW COMPARISON:  November 05, 2020. FINDINGS: EKG leads project over the chest. Since the previous study the patient has been extubated. Chest support tubes project over the heart terminating just below the clavicular heads with respect to the more rightward oriented tube, not changed in position. The LEFT-sided tube terminates over the LEFT chest in the retrocardiac region with similar appearance. RIGHT IJ sheath in similar position Swan-Ganz catheter now removed. Persistent tiny RIGHT apical pneumothorax, unchanged. Trachea midline. Cardiomediastinal contours and hilar structures are stable. Graded opacity at the RIGHT lung base is subtle with obscured LEFT hemidiaphragm along the medial aspect when compared to the prior study. Possible deep sulcus sign on the LEFT otherwise no evidence of LEFT-sided pneumothorax. On limited assessment no acute  skeletal process. IMPRESSION: 1. Persistent tiny RIGHT apical pneumothorax. 2. Possible deep sulcus sign on the LEFT potentially a very small LEFT pneumothorax. Attention on follow-up. 3. Interval extubation, removal of gastric tube and removal of Swan-Ganz catheter, otherwise stable position of support apparatus. A call is out to the referring provider to further discuss findings in the above case. Electronically Signed: By: Donzetta Kohut M.D. On: 11/06/2020 09:05   DG Chest Port 1 View  Result Date: 11/05/2020 CLINICAL DATA:  45 year old male with open heart surgery. EXAM: PORTABLE CHEST 1 VIEW COMPARISON:  Chest CT dated 11/04/2020. FINDINGS: Right IJ Swan-Ganz catheter with tip likely close to the bifurcation of the main pulmonary trunk. Endotracheal tube with tip approximately 6.5 cm above the carina. Enteric tube extends below the diaphragm with tip beyond the inferior margin of the image. Inferiorly accessed mediastinal drain. The lungs are clear. There is no pleural effusion. Tiny linear lucency in the right apex may be artifactual or represent minimal right pneumothorax measuring approximately 4 mm in thickness. Close follow-up recommended. The cardiac silhouette is within limits. No acute osseous pathology. IMPRESSION: 1. Status post open heart surgery with support apparatus as above. 2. Possible tiny right apical pneumothorax. Attention on follow-up imaging recommended. These results were called by telephone at the time of interpretation on 11/05/2020 at 4:03 am to the patient's nurse Jess Barters, who verbally acknowledged these results. Electronically Signed   By: Elgie Collard M.D.   On: 11/05/2020 04:07   DG Abd Portable 1V  Result Date: 11/07/2020 CLINICAL DATA:  Feeding tube placement EXAM: PORTABLE ABDOMEN - 1 VIEW COMPARISON:  11/07/2020 at 0310 hours FINDINGS: Interval placement of large bore feeding tube which extends below the diaphragm,  distal tip beyond the inferior margin of the film.  Additional enteric tube is again seen terminating within the gastric body. Mediastinal drain and left basilar chest tube are seen. IMPRESSION: Interval placement of feeding tube which extends below the diaphragm into the stomach with distal tip beyond the inferior margin of the film. Electronically Signed   By: Duanne Guess D.O.   On: 11/07/2020 12:33   DG Abd Portable 1V  Result Date: 11/07/2020 CLINICAL DATA:  45 year male status post feeding tube placement. EXAM: PORTABLE ABDOMEN - 1 VIEW COMPARISON:  Chest radiograph dated 11/06/2020 and CT of the chest abdomen pelvis dated 11/04/2020. FINDINGS: Partially visualized enteric tube with side-port just distal to the GE junction and tip in the proximal body of the stomach. Inferiorly accessed mediastinal drains noted. No bowel dilatation or evidence of obstruction. No free air. Probable small left pleural effusion. IMPRESSION: Enteric tube with tip in the proximal body of the stomach. Electronically Signed   By: Elgie Collard M.D.   On: 11/07/2020 03:35   EEG adult  Result Date: 11/08/2020 Charlsie Quest, MD     11/08/2020  6:22 PM Patient Name: Jesse [name] MRN: [id number] Epilepsy Attending: Charlsie Quest Referring Physician/Provider: Dr Delia Heady Date: 11/08/2020 Duration: 23.42 mins Patient history: 45yo M with R MCA infarct and ams. EEG to evaluate for seizure Level of alertness: Awake AEDs during EEG study: Clonazepam Technical aspects: This EEG study was done with scalp electrodes positioned according to the 10-20 International system of electrode placement. Electrical activity was acquired at a sampling rate of  and reviewed with a high frequency filter of  and a low frequency filter of . EEG data were recorded continuously and digitally stored. Description: No posterior dominant rhythm was seen. EEG showed continuous 5-8Hz  theta-alpha activity in left hemisphere and low amplitude 2-3h in right hemisphere.  Hyperventilation and photic stimulation were not performed.   ABNORMALITY - Continuous slow, generalized and lateralized right hemisphere IMPRESSION: This study is suggestive of cortical dysfunction arising from right hemisphere likely secondary to underlying structural abnormality/stroke. Additionally, there is moderate diffuse encephalopathy, nonspecific etiology. No seizures or epileptiform discharges were seen throughout the recording. Priyanka Annabelle Harman   ECHO INTRAOPERATIVE TEE  Result Date: 11/05/2020  *INTRAOPERATIVE TRANSESOPHAGEAL REPORT *  Patient Name:   ABDULLA POOLEY Date of Exam: 11/04/2020 Medical Rec #:  [id number]     Height: Accession #:    [id number]    Weight:       145.5 lb Date of Birth:  [birth year]    BSA:          1.63 m Patient Age:    44 years      BP:           163/100 mmHg Patient Gender: M             HR:           70 bpm. Exam Location:  Inpatient Transesophogeal exam was perform intraoperatively during surgical procedure. Patient was closely monitored under general anesthesia during the entirety of examination. Indications:     Aortic dissection Performing Phys: [id number] CLARENCE H OWEN Diagnosing Phys: Lewie Loron MD Complications: No known complications during this procedure. POST-OP IMPRESSIONS - Left Ventricle: The left ventricle is unchanged from pre-bypass. - Right Ventricle: The right ventricle appears unchanged from pre-bypass. - Aorta: there is no dissection present in the aorta A graft was placed in the ascending aorta for repair. - Left Atrial Appendage: The  left atrial appendage appears unchanged from pre-bypass. - Aortic Valve: The aortic valve appears unchanged from pre-bypass. - Mitral Valve: The mitral valve appears unchanged from pre-bypass. - Tricuspid Valve: The tricuspid valve appears unchanged from pre-bypass. PRE-OP FINDINGS  Left Ventricle: The left ventricle has normal systolic function, with an ejection fraction of 55-60%. The cavity size was normal. There is  severe concentric left ventricular hypertrophy. Right Ventricle: The right ventricle has normal systolic function. The cavity was normal. There is no increase in right ventricular wall thickness. Left Atrium: Left atrial size was normal in size. No left atrial/left atrial appendage thrombus was detected. Right Atrium: Right atrial size was normal in size. Interatrial Septum: The interatrial septum was not assessed. There is no evidence of a patent foramen ovale. Pericardium: Trivial pericardial effusion is present. Mitral Valve: The mitral valve is normal in structure. Mitral valve regurgitation is trivial by color flow Doppler. Tricuspid Valve: The tricuspid valve was normal in structure. Tricuspid valve regurgitation is trivial by color flow Doppler. Aortic Valve: The aortic valve is tricuspid Aortic valve regurgitation is trivial by color flow Doppler. There is no stenosis of the aortic valve. Pulmonic Valve: The pulmonic valve was normal in structure. Pulmonic valve regurgitation is not visualized by color flow Doppler. Aorta: There is evidence of a dissection in the aortic root, ascending aorta and aortic arch. Shunts: There is no evidence of an atrial septal defect.  Lewie Loron MD Electronically signed by Lewie Loron MD Signature Date/Time: 11/05/2020/2:44:37 AM    Final    CT Angio Chest/Abd/Pel for Dissection W and/or W/WO  Result Date: 11/04/2020 CLINICAL DATA:  Further evaluation of aortic dissection seen on prior same day imaging EXAM: CT ANGIOGRAPHY CHEST, ABDOMEN AND PELVIS TECHNIQUE: Non-contrast CT of the chest was initially obtained. Multidetector CT imaging through the chest, abdomen and pelvis was performed using the standard protocol during bolus administration of intravenous contrast. Multiplanar reconstructed images and MIPs were obtained and reviewed to evaluate the vascular anatomy. CONTRAST:  OMNIPAQUE IOHEXOL 350 MG/ML SOLN COMPARISON:  Same day CT neck. FINDINGS: CTA CHEST  FINDINGS Cardiovascular: Ascending aortic dissection extending from superior to the sino-tubular junction to the transverse arch with continuation of the dissection into the innominate artery with thrombus in the false lumen of the innominate artery. No involvement of the descending thoracic aorta. The left main and right coronary arteries not appear to be involved and are patent as are the portions of the LAD and left circumflex. Normal size heart. No significant pericardial effusion/thickening. No central pulmonary embolus. Mediastinum/Nodes: No mediastinal fluid collection. No discrete thyroid nodule. No pathologically enlarged mediastinal, hilar or axillary lymph nodes. The trachea and esophagus are grossly unremarkable. Lungs/Pleura: No suspicious pulmonary nodules or masses. No focal consolidation. No pleural effusion. No pneumothorax. Musculoskeletal: No acute osseous abnormality. Review of the MIP images confirms the above findings. CTA ABDOMEN AND PELVIS FINDINGS VASCULAR Aorta: Aortic atherosclerosis. Normal caliber abdominal aorta without aneurysm, dissection, vasculitis or significant stenosis. Celiac: Patent without evidence of aneurysm, dissection, vasculitis or significant stenosis. SMA: Patent without evidence of aneurysm, dissection, vasculitis or significant stenosis. Renals: Both renal arteries are patent without evidence of aneurysm, dissection, vasculitis, fibromuscular dysplasia or significant stenosis. IMA: Patent without evidence of aneurysm, dissection, vasculitis or significant stenosis. Inflow: Patent without evidence of aneurysm, dissection, vasculitis or significant stenosis. Veins: No obvious venous abnormality within the limitations of this arterial phase study. Review of the MIP images confirms the above findings. NON-VASCULAR Hepatobiliary: No suspicious hepatic  lesion. Gallbladder is unremarkable. No biliary ductal dilation. Pancreas: Within normal limits. Spleen: Within normal  limits. Adrenals/Urinary Tract: Adrenal glands are unremarkable. Kidneys are normal, without renal calculi, solid enhancing lesion, or hydronephrosis. Symmetric renal enhancement. Bladder is unremarkable. Stomach/Bowel: Stomach is within normal limits. Appendix appears normal. No evidence of bowel wall thickening, distention, or inflammatory changes. Lymphatic: No pathologically enlarged abdominal or pelvic lymph nodes. Reproductive: Prostate is unremarkable. Other: No abdominopelvic ascites.  No pneumoperitoneum. Musculoskeletal: Mild multilevel degenerative change of the spine. No acute osseous abnormality Review of the MIP images confirms the above findings. IMPRESSION: 1. 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 No involvement of the descending thoracic aorta. No pericardial effusion or mediastinal fluid. 2. No acute findings within the abdomen or pelvis. 3.  Aortic Atherosclerosis (ICD10-I70.0). Electronically Signed   By: Maudry Mayhew MD   On: 11/04/2020 20:22   CT HEAD CODE STROKE WO CONTRAST  Result Date: 11/04/2020 CLINICAL DATA:  Code stroke.  Aphasia EXAM: CT HEAD WITHOUT CONTRAST TECHNIQUE: Contiguous axial images were obtained from the base of the skull through the vertex without intravenous contrast. COMPARISON:  None. FINDINGS: Brain: There is no acute intracranial hemorrhage or mass effect. Question small subtle loss of gray-white differentiation in the right insular region. Ventricles and sulci are normal in size and configuration. Vascular: Question of hyperdensity along distal right M2 or proximal M3 MCA branch in the posterior sylvian fissure. Skull: Unremarkable. Sinuses/Orbits: No acute abnormality. Other: Mastoid air cells are clear. ASPECTS [facility] Stroke Program Early CT Score) -  Ganglionic level infarction (caudate, lentiform nuclei, internal capsule, insula, M1-M3 cortex): 6 - Supraganglionic infarction (M4-M6 cortex): 3 Total score (0-10 with 10 being normal): 9 IMPRESSION: No acute intracranial hemorrhage. Question subtle small loss of gray-white differentiation in the right insular region (ASPECT score is 9). Possible hyperdense distal right M2 or proximal M3 MCA branch in the posterior sylvian fissure. These results were communicated to Dr. Iver Nestle at 6:45 pm on 11/04/2020 by text page via the [facility] [place] messaging system. Electronically Signed   By: Guadlupe Spanish M.D.   On: 11/04/2020 18:59   CT ANGIO HEAD NECK W WO CM (CODE STROKE)  Result Date: 11/04/2020 CLINICAL DATA:  Code stroke EXAM: CT ANGIOGRAPHY HEAD AND NECK TECHNIQUE: Multidetector CT imaging of the head and neck was performed using the standard protocol during bolus administration of intravenous contrast. Multiplanar CT image reconstructions and MIPs were obtained to evaluate the vascular anatomy. Carotid stenosis measurements (when applicable) are obtained utilizing NASCET criteria, using the distal internal carotid diameter as the denominator. CONTRAST:  75mL OMNIPAQUE IOHEXOL 350 MG/ML SOLN COMPARISON:  None. FINDINGS: CTA NECK Aortic arch: A dissection flap is present within the included ascending aorta extending into the transverse portion of the arch but not the descending portion. Dissection involves the innominate artery with thrombosis of the false lumen. This continues into the right common carotid. Left common carotid and left subclavian artery origins are patent. Right carotid system: As noted above, there is extension of dissection into the common carotid with occlusion of the false lumen resulting in narrowing of the true lumen. Minimal diameter of 1.5 mm. The external carotid origin is patent. There is minimal contrast enhancement at the ICA origin and subsequent minimal enhancement along portions of the  cervical ICA. Left carotid  system: Patent. Trace calcified plaque along the proximal ICA. No stenosis. Vertebral arteries: Patent.  Right vertebral artery is dominant. Skeleton: Degenerative changes of the cervical spine. Other neck: Unremarkable. Upper chest: Included upper lungs are clear. Review of the MIP images confirms the above findings CTA HEAD Anterior circulation: Possible faint enhancement of the intracranial right internal carotid artery. There is reconstitution at the terminus. Right M1 and proximal M2 MCA are patent. There is occlusion of a small right distal M2 and proximal M3 MCA branch corresponding to abnormality on noncontrast CT. Left middle and both anterior cerebral arteries are patent. Anterior communicating artery is present. Posterior circulation: Intracranial vertebral arteries are patent. The left vertebral artery becomes small in caliber after PICA origin. Basilar artery is patent. Major cerebellar artery origins are patent. Posterior cerebral arteries are patent there is a left posterior communicating artery. Venous sinuses: Patent as allowed by contrast bolus timing. 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 This extends into the right common carotid with narrowing of the true lumen to a minimal diameter of 1.5 mm. Minimal enhancement within the right cervical and intracranial ICA. Reconstitution at the right ICA terminus. No proximal intracranial vessel occlusion. 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 These results were called by telephone at the time of interpretation on 11/04/2020 at 6:59 pm to provider [facility] , who verbally acknowledged these results. Electronically Signed   By: Guadlupe Spanish  M.D.   On: 11/04/2020 19:10     PHYSICAL EXAM  Temp:  [97.7 F (36.5 C)-98.4 F (36.9 C)] 98.3 F (36.8 C) (07/18 1100) Pulse Rate:  [54-159] 67 (07/18 1100) Resp:  [11-25] 21 (07/18 1100) BP: (98-170)/(64-116) 122/91 (07/18 1100) SpO2:  [94 %-100 %] 97 % (07/18 1100) Weight:  [62.2 kg] 62.2 kg (07/18 0500)  General -frail malnourished looking middle-aged Caucasian male,  Ophthalmologic - fundi not visualized due to noncooperation.  Cardiovascular - Regular rate and rhythm.  Neuro - drowsy.  Sedated eyes closed but opens to stimulation not following few midline commands and with prompts on the right side.  With forced eye opening, eyes i right gaze deviation and unable to look to the left., not blinking to visual threat, doll's eyes present, not tracking, PERRL. Corneal reflex weakly present bilaterally, gag and cough absent. Breathing  over the vent.  Facial asymmetry with left lower facial weakness.  Tongue protrusion in midline. On pain stimulation, moving all extremities but right much greater than left. DTR diminished and no babinski. Sensation, coordination and gait not tested.     ASSESSMENT/PLAN Mr. Lavalle Skoda is a 45 y.o. male with history of anxiety, now seeing doctor for 22 years admitted for confusion, headache, blurry vision and aphasia. No tPA given due to outside window and aortic dissection.    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  Tiny punctate bilateral cerebral and cerebellar embolic infarcts MRA head and neck interval revascularization of the right ICA and MCA and right aorto to subclavian bypass with patent right carotid artery 2D Echo EF 55 to 60% EEG 11/08/2020  focal right hemispheric slowing and mild generalized slowing but no definite epileptiform activity. LDL 49 HgbA1c 5.2 Heparin subq for VTE prophylaxis No antithrombotic prior to admission, now on ASA 81 per CTS.  Ongoing aggressive stroke risk factor management Therapy recommendations: CLR  disposition: Pending  Aortic dissection CTA head and neck showed type A aortic dissection, not involving descending aorta CTS on board Status post repair MRA neck see above Respiratory failure Intubated on vent CCM on board On sedation with intermittent Versed IV  Dysphagia Currently n.p.o. Speech on board Core track placed On tube feeding  Acute blood loss anemia Hb 7.1-7.5-8.5-9.5-10.2->8.7-> 7.8 Status post PRBC during procedure Close monitoring On ASA  Delirium  Multifactorial, likely related to stroke, procedure, anemia Intubated Soft wrist restrain On precedex   Other Stroke Risk Factors   Other Active Problems Hyponatremia Na 133-135-138-136  Hospital day # 5 Continue mobilization in bed.  Therapy consults.  Speech therapy for swallow eval.  Expect slow improvement over the next few days.  Continue aspirin.  Stroke team will sign off.  Kindly call for questions. This patient is critically ill due to respiratory failure, aortic dissection status post surgery, large right MCA stroke, severe anemia, dysphagia and delirium and at significant risk of neurological worsening, death form recurrent stroke, hemorrhagic conversion, CHF, cardiac arrest, seizure. This patient's care requires constant monitoring of vital signs, hemodynamics, respiratory and cardiac monitoring, review of multiple databases, neurological assessment, discussion  with family, other specialists and medical decision making of high complexity. I spent 32 minutes of neurocritical care time in the care of this patient. I had long discussion with wife at bedside, updated pt current condition, treatment plan and potential prognosis, and answered all the questions.  She expressed understanding and appreciation.    Delia Heady MD Stroke Neurology 11/10/2020 12:43 PM    To contact Stroke Continuity provider, please refer to [email or web address]. After hours, contact General Neurology

## 2020-11-10 NOTE — Progress Notes (Signed)
NAME:  Jesse [name], MRN:  [id number], DOB:  [birth year], LOS: 5 ADMISSION DATE:  11/04/2020, CONSULTATION DATE:  7/14 REFERRING MD:  Dr. Cornelius Moras, CHIEF COMPLAINT:  hypoxia   History of Present Illness:   45 year old male with PMH as below, whish is significant for anxiety, depression, and tobacco abuse (Not treated medically; self medicated with ETOH). Admitted 7/12 w/ new stroke due to aortic dissection resulting in loss of flow to right carotid.,  Emergently underwent replacement of ascending aorta and proximal transverse aortic arch, Aorta to right common carotid bypass, and aorta to right subclavian bypass. Postoperatively he was transferred to the CVICU. 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   Had severe agitated delirium and then worsening oxygen requirements on am of 7/15 requiring intubation. PCCM asked to assist with care   Pertinent  Medical History   has a past medical history of Anxiety, Depression, Essential hypertension, and S/P aortic dissection repair (11/05/2020).  Significant Hospital Events: Including procedures, antibiotic start and stop dates in addition to other pertinent events   7/12 admit for aortic dissection, emergently for repair.  7/13. Agitated at times.  7/14 agitation worsened and he became hypoxemic requiring heated high flow and NRB simultaneously. Unable to sustain sats over 85%. PCCM called emergently to bedside by bedside RN.  Intubated. New Left BBB. Placed on fent and precedex.  7/15 PCCM assuming care. Added clonazepam  7/16 extubated to high flow nasal cannula.  Still having episodes of agitation. 7/18 weaning off precedex  Interim History / Subjective:  Remains confused, not answering questions. RASS -3 overnight, weaning precedex today.  Objective   Blood pressure 137/78, pulse (!) 58, temperature 97.7 F (36.5 C), temperature source Axillary, resp. rate (!) 23, height 6\' 2"  (1.88 m), weight 62.2 kg,  SpO2 98 %.        Intake/Output Summary (Last 24 hours) at 11/10/2020 1005 Last data filed at 11/10/2020 0900 Gross per 24 hour  Intake 1635.45 ml  Output 1095 ml  Net 540.45 ml    Filed Weights   11/08/20 0600 11/09/20 0500 11/10/20 0500  Weight: 64.3 kg 62.2 kg 62.2 kg    Examination: General: frail, chronically ill appearing man lying in bed in NAD HEENT: temporal wasting, NGT in place Pulmonary: weak wet-sounding cough, no rhales, breathing comfortably on [place] Cardiac: S1S2, RRR  GU: foley draining clear yellow urine Neuro: remains on precedex infusion, awake, but not following commands. Will move extremities spontaneously except RUE Derm: skin warm, dry. Healing sternotomy incision.   Resolved Hospital Problem list   Acute hypoxic respiratory failure  Assessment & Plan:   Resolving acute hypoxemic respiratory failure -pulmonary hygiene; remains high risk for reintubation with weak cough & dysphagia -wean FiO2 as able to maintain SpO2 >90%  Agitated delirium from acute metabolic encephalopathy requiring titration of sedative infusion  History of tobacco abuse Chronic anxiety disorder-- likely was self medicating  Acute right MCA stroke due to aortic dissection.   -weaning precedex infusion -con't valproate -con't clonazepam PRN & lorazepam PRN -have been avoiding APs due to prolonged QTc -requires restraints for impulsive, non-redirectable behavior and agitation -ok to continue wellbutrin long-term for anxiety with hopes that it will help with tobacco cessation -daily aspirin, statin  Dysphagia -enteral nutrition -SLP  Aortic dissection type A with extension into R carotid artery s/p graft repair -post-op care per cardiology -con't enteral supplemental nutrition -hydralazine PRN for hypertension  Acute blood loss anemia  Mild thrombocytopenia -monitor -transfuse for Hb<7 or hemodynamically significant bleeding -transfuse for platelets <10 unless  bleeding  Deconditioning Severe protein energy malnutrition -enteral nutrition -PT, OT, SLP -anticipate a long road to recovery  Best Practice (right click and "Reselect all SmartList Selections" daily)   Diet/type: tubefeeds DVT prophylaxis: LMWH GI prophylaxis: N/A Lines: N/A Foley:  removal ordered  Code Status:  full code Last date of multidisciplinary goals of care discussion [ ]    This patient is critically ill with multiple organ system failure which requires frequent high complexity decision making, assessment, support, evaluation, and titration of therapies. This was completed through the application of advanced monitoring technologies and extensive interpretation of multiple databases. During this encounter critical care time was devoted to patient care services described in this note for 41 minutes.   , DO 11/10/20 5:24 PM [facility]

## 2020-11-10 NOTE — Progress Notes (Signed)
Nutrition Follow Up  DOCUMENTATION CODES:   Severe malnutrition in context of chronic illness  INTERVENTION:   Transition to standard formula -Osmolite at 60 ml/hr via Cortrak (1440 ml per day) -Prosource TF 45 ml TID -Free water flushes 100 ml Q8 hours  Provides 2280 kcal, 123 gm protein, 1097 ml free water daily (1397 ml with flushes)  NUTRITION DIAGNOSIS:   Severe Malnutrition related to chronic illness (HTN, anxiety, ETOH use) as evidenced by severe muscle depletion, severe fat depletion.  Ongoing  GOAL:   Patient will meet greater than or equal to 90% of their needs  Addressed via TF  MONITOR:   TF tolerance, Labs  REASON FOR ASSESSMENT:   Consult, Ventilator Enteral/tube feeding initiation and management  ASSESSMENT:   Pt with PMH of anxiety, depression, HTN, ETOH use, and tobacco abuse admitted 7/12 with R MCA stroke due to aortic dissection resulting in loss of flow to R carotid.    7/12 - s/p emergent replacement of ascending aorta and proximal transverse aortic arch, Aorta to right common carotid bypass, and aorta to right subclavian bypass 7/14 - increased agitation became hypoxic required intubation, new L BBB 7/16 - Cortrak placed, TF started  7/16 - extubated   Precedex off. Remains agitated. Cortrak pulled slightly out by patient on 7/16, advanced per MD on 7/17. Xray confirms tube tip located in gastric body. Transition to standard formula. Follow for diet advancement per SLP.   Admission weight: 66 kg  Current weight: 62.2 kg   UOP: 1255 ml x 24 hrs  Medications: dulcolax, colace, SS novolog, miralax Labs: CBG 94-130  Diet Order:   Diet Order             Diet NPO time specified  Diet effective now                   EDUCATION NEEDS:   No education needs have been identified at this time  Skin:  Skin Assessment: Skin Integrity Issues: Skin Integrity Issues:: Incisions Incisions: chest, groin  Last BM:  7/17  Height:   Ht  Readings from Last 1 Encounters:  11/07/20 6\' 2"  (1.88 m)    Weight:   Wt Readings from Last 1 Encounters:  11/10/20 62.2 kg    BMI:  Body mass index is 17.61 kg/m.  Estimated Nutritional Needs:   Kcal:  2100-2300  Protein:  115-125 grams  Fluid:  >2 L/day  11/12/20 MS, RD, LDN, CNSC Clinical Nutrition Pager listed in AMION

## 2020-11-11 ENCOUNTER — Inpatient Hospital Stay (HOSPITAL_COMMUNITY): Payer: Medicaid Other

## 2020-11-11 ENCOUNTER — Other Ambulatory Visit (HOSPITAL_COMMUNITY): Payer: Self-pay

## 2020-11-11 DIAGNOSIS — I63131 Cerebral infarction due to embolism of right carotid artery: Secondary | ICD-10-CM

## 2020-11-11 DIAGNOSIS — I82612 Acute embolism and thrombosis of superficial veins of left upper extremity: Secondary | ICD-10-CM

## 2020-11-11 DIAGNOSIS — L03114 Cellulitis of left upper limb: Secondary | ICD-10-CM

## 2020-11-11 DIAGNOSIS — E43 Unspecified severe protein-calorie malnutrition: Secondary | ICD-10-CM

## 2020-11-11 DIAGNOSIS — I809 Phlebitis and thrombophlebitis of unspecified site: Secondary | ICD-10-CM

## 2020-11-11 DIAGNOSIS — F1910 Other psychoactive substance abuse, uncomplicated: Secondary | ICD-10-CM

## 2020-11-11 DIAGNOSIS — I10 Essential (primary) hypertension: Secondary | ICD-10-CM

## 2020-11-11 DIAGNOSIS — G9340 Encephalopathy, unspecified: Secondary | ICD-10-CM

## 2020-11-11 LAB — BASIC METABOLIC PANEL
Anion gap: 9 (ref 5–15)
BUN: 16 mg/dL (ref 6–20)
CO2: 28 mmol/L (ref 22–32)
Calcium: 8.7 mg/dL — ABNORMAL LOW (ref 8.9–10.3)
Chloride: 103 mmol/L (ref 98–111)
Creatinine, Ser: 0.89 mg/dL (ref 0.61–1.24)
GFR, Estimated: >60 mL/min (ref >60)
Glucose, Bld: 103 mg/dL — ABNORMAL HIGH (ref 70–99)
Potassium: 3.8 mmol/L (ref 3.5–5.1)
Sodium: 140 mmol/L (ref 135–145)

## 2020-11-11 LAB — GLUCOSE, CAPILLARY
Glucose-Capillary: 104 mg/dL — ABNORMAL HIGH (ref 70–99)
Glucose-Capillary: 114 mg/dL — ABNORMAL HIGH (ref 70–99)
Glucose-Capillary: 122 mg/dL — ABNORMAL HIGH (ref 70–99)
Glucose-Capillary: 132 mg/dL — ABNORMAL HIGH (ref 70–99)
Glucose-Capillary: 76 mg/dL (ref 70–99)
Glucose-Capillary: 92 mg/dL (ref 70–99)

## 2020-11-11 LAB — MAGNESIUM: Magnesium: 2 mg/dL (ref 1.7–2.4)

## 2020-11-11 LAB — PHOSPHORUS: Phosphorus: 4 mg/dL (ref 2.5–4.6)

## 2020-11-11 LAB — HEPARIN LEVEL (UNFRACTIONATED): Heparin Unfractionated: 0.11 IU/mL — ABNORMAL LOW (ref 0.30–0.70)

## 2020-11-11 MED ORDER — VANCOMYCIN HCL 1250 MG/250ML IV SOLN
1250.0000 mg | Freq: Two times a day (BID) | INTRAVENOUS | Status: DC
  Administered 2020-11-11: 1250 mg via INTRAVENOUS
  Filled 2020-11-11 (×2): qty 250

## 2020-11-11 MED ORDER — POTASSIUM CHLORIDE 20 MEQ PO PACK
40.0000 meq | PACK | Freq: Once | ORAL | Status: AC
  Administered 2020-11-11: 40 meq
  Filled 2020-11-11: qty 2

## 2020-11-11 MED ORDER — CEPHALEXIN 250 MG/5ML PO SUSR
500.0000 mg | Freq: Four times a day (QID) | ORAL | Status: DC
  Administered 2020-11-11 – 2020-11-12 (×3): 500 mg
  Filled 2020-11-11 (×5): qty 10

## 2020-11-11 MED ORDER — POLYETHYLENE GLYCOL 3350 17 G PO PACK
17.0000 g | PACK | Freq: Every day | ORAL | Status: DC
  Administered 2020-11-11 – 2020-11-12 (×2): 17 g
  Filled 2020-11-11 (×3): qty 1

## 2020-11-11 MED ORDER — CLONAZEPAM 0.5 MG PO TBDP
2.0000 mg | ORAL_TABLET | Freq: Three times a day (TID) | ORAL | Status: DC
  Administered 2020-11-11 – 2020-11-13 (×7): 2 mg
  Filled 2020-11-11 (×7): qty 4

## 2020-11-11 MED ORDER — CEPHALEXIN 500 MG PO CAPS
500.0000 mg | ORAL_CAPSULE | Freq: Four times a day (QID) | ORAL | Status: DC
  Filled 2020-11-11: qty 1

## 2020-11-11 MED ORDER — THIAMINE HCL 100 MG PO TABS
100.0000 mg | ORAL_TABLET | Freq: Every day | ORAL | Status: DC
  Administered 2020-11-11 – 2020-11-13 (×3): 100 mg
  Filled 2020-11-11 (×3): qty 1

## 2020-11-11 MED ORDER — FOLIC ACID 1 MG PO TABS
1.0000 mg | ORAL_TABLET | Freq: Every day | ORAL | Status: DC
  Administered 2020-11-11 – 2020-11-13 (×3): 1 mg
  Filled 2020-11-11 (×3): qty 1

## 2020-11-11 MED ORDER — BUPROPION HCL 75 MG PO TABS
75.0000 mg | ORAL_TABLET | Freq: Two times a day (BID) | ORAL | Status: DC
  Administered 2020-11-11 – 2020-11-13 (×4): 75 mg
  Filled 2020-11-11 (×5): qty 1

## 2020-11-11 MED ORDER — VANCOMYCIN HCL 1500 MG/300ML IV SOLN
1500.0000 mg | Freq: Once | INTRAVENOUS | Status: AC
  Administered 2020-11-11: 1500 mg via INTRAVENOUS
  Filled 2020-11-11: qty 300

## 2020-11-11 MED ORDER — CEPHALEXIN 250 MG/5ML PO SUSR
500.0000 mg | Freq: Four times a day (QID) | ORAL | Status: DC
  Administered 2020-11-11: 500 mg
  Filled 2020-11-11 (×2): qty 10

## 2020-11-11 MED ORDER — ADULT MULTIVITAMIN LIQUID CH
15.0000 mL | Freq: Every day | ORAL | Status: DC
  Administered 2020-11-11 – 2020-11-13 (×3): 15 mL
  Filled 2020-11-11 (×3): qty 15

## 2020-11-11 MED ORDER — LABETALOL HCL 100 MG PO TABS
100.0000 mg | ORAL_TABLET | Freq: Two times a day (BID) | ORAL | Status: DC
  Administered 2020-11-11 (×2): 100 mg
  Filled 2020-11-11 (×3): qty 1

## 2020-11-11 MED ORDER — DIPHENHYDRAMINE HCL 50 MG/ML IJ SOLN
25.0000 mg | Freq: Once | INTRAMUSCULAR | Status: AC
  Administered 2020-11-11: 25 mg via INTRAVENOUS
  Filled 2020-11-11: qty 1

## 2020-11-11 MED ORDER — DEXTROSE 50 % IV SOLN
12.5000 g | INTRAVENOUS | Status: AC
  Administered 2020-11-11: 12.5 g via INTRAVENOUS
  Filled 2020-11-11: qty 50

## 2020-11-11 MED ORDER — ASPIRIN 81 MG PO CHEW
81.0000 mg | CHEWABLE_TABLET | Freq: Every day | ORAL | Status: DC
  Administered 2020-11-11 – 2020-11-13 (×3): 81 mg
  Filled 2020-11-11 (×3): qty 1

## 2020-11-11 MED ORDER — DOCUSATE SODIUM 50 MG/5ML PO LIQD
100.0000 mg | Freq: Two times a day (BID) | ORAL | Status: DC
  Administered 2020-11-11 – 2020-11-12 (×3): 100 mg
  Filled 2020-11-11 (×4): qty 10

## 2020-11-11 MED ORDER — HEPARIN (PORCINE) 25000 UT/250ML-% IV SOLN
1250.0000 [IU]/h | INTRAVENOUS | Status: DC
  Administered 2020-11-11: 900 [IU]/h via INTRAVENOUS
  Filled 2020-11-11: qty 250

## 2020-11-11 MED ORDER — QUETIAPINE FUMARATE 25 MG PO TABS
25.0000 mg | ORAL_TABLET | Freq: Two times a day (BID) | ORAL | Status: DC
  Administered 2020-11-11 – 2020-11-13 (×5): 25 mg
  Filled 2020-11-11 (×5): qty 1

## 2020-11-11 NOTE — Progress Notes (Signed)
Upper extremity venous LT study completed.  Preliminary results relayed to [facility], DO.  See CV Proc for preliminary results report.   Jean Rosenthal, RDMS, RVT

## 2020-11-11 NOTE — Progress Notes (Signed)
eLink Physician-Brief Progress Note Patient Name: Jesse [name] DOB: [birth year] MRN: [id number]   Date of Service  11/11/2020  HPI/Events of Note  Multiple issues: 1. Frequent liquid BM's - Nursing request for Flexiseal. 2. Agitation - Nursing request for more sedation. Patient looks comfortable on video assessment. He does not need more sedation. 3. Nursing request for pain medication - Nurse not able to describe and characterize location and type of pain. No pain medications indicated. 4. Nursing request for Benadryl. "Patient takes a lot of Benadryl at home.   eICU Interventions  Plan: Place Flexiseal. Benadryl 25 mg IV X 1 now.      Intervention Category Major Interventions: Delirium, psychosis, severe agitation - evaluation and management  Loyd Marhefka Eugene 11/11/2020, 3:38 AM

## 2020-11-11 NOTE — TOC Benefit Eligibility Note (Signed)
Patient Advocate Encounter  Insurance verification completed.    The patient is uninsured  Lindell Renfrew, CPhT Pharmacy Patient Advocate Specialist [place] [facility] Direct Number: [phone]  Fax: [phone]        

## 2020-11-11 NOTE — Progress Notes (Signed)
NAME:  Jesse [name], MRN:  [id number], DOB:  [birth year], LOS: 6 ADMISSION DATE:  11/04/2020, CONSULTATION DATE:  7/14 REFERRING MD:  Dr. Cornelius Moras, CHIEF COMPLAINT:  hypoxia   History of Present Illness:   45 year old male with PMH as below, whish is significant for anxiety, depression, and tobacco abuse (Not treated medically; self medicated with ETOH). Admitted 7/12 w/ new stroke due to aortic dissection resulting in loss of flow to right carotid.,  Emergently underwent replacement of ascending aorta and proximal transverse aortic arch, Aorta to right common carotid bypass, and aorta to right subclavian bypass. Postoperatively he was transferred to the CVICU. 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   Had severe agitated delirium and then worsening oxygen requirements on am of 7/15 requiring intubation. PCCM asked to assist with care   Pertinent  Medical History   has a past medical history of Anxiety, Depression, Essential hypertension, and S/P aortic dissection repair (11/05/2020).  Significant Hospital Events: Including procedures, antibiotic start and stop dates in addition to other pertinent events   7/12 admit for aortic dissection, emergently for repair.  7/13. Agitated at times.  7/14 agitation worsened and he became hypoxemic requiring heated high flow and NRB simultaneously. Unable to sustain sats over 85%. PCCM called emergently to bedside by bedside RN.  Intubated. New Left BBB. Placed on fent and precedex.  7/15 PCCM assuming care. Added clonazepam  7/16 extubated to high flow nasal cannula.  Still having episodes of agitation. 7/18 weaned off precedex 7/19 c/f septic thrombophlebitis vs. cellulitis. Start Keflex  Interim History / Subjective:  Opens eyes to voice, mumbling. Follows commands. Off precedex. Tolerating nasal cannula.   Objective   Blood pressure (!) 160/79, pulse 75, temperature 98 F (36.7 C), temperature source Oral,  resp. rate (!) 24, height 6\' 2"  (1.88 m), weight 61.2 kg, SpO2 98 %.    FiO2 (%):  [28 %] 28 %   Intake/Output Summary (Last 24 hours) at 11/11/2020 1043 Last data filed at 11/11/2020 0700 Gross per 24 hour  Intake 2318.62 ml  Output 1325 ml  Net 993.62 ml    Filed Weights   11/09/20 0500 11/10/20 0500 11/11/20 0500  Weight: 62.2 kg 62.2 kg 61.2 kg    Examination: General: frail, chronically ill appearing man lying in bed in NAD HEENT: temporal wasting, NGT in place. PERRL.  Pulmonary: weak cough, CTAB on anterior exam, breathing comfortably on [place] Cardiac: S1S2, RRR  GU: condom cath in place Neuro: Responsive to voice. Follows commands. Will move extremities spontaneously except RUE Derm: Erythematous and warm in L antecubital fossa at prior IV site. Cord-like firmness on palpation.   Resolved Hospital Problem list   Acute hypoxic respiratory failure  Assessment & Plan:   Resolving acute hypoxemic respiratory failure -pulmonary hygiene; remains high risk for reintubation with weak cough & dysphagia -wean FiO2 as able to maintain SpO2 >90%  Agitated delirium from acute metabolic encephalopathy requiring titration of sedative infusion  History of tobacco abuse Chronic anxiety disorder-- likely was self medicating  Acute right MCA stroke due to aortic dissection.   -con't valproate, follow-up level -con't clonazepam q8h & lorazepam PRN -Qtc <480ms, plan to start seroquel 25mg  BID for persistent agitation  -requires restraints for impulsive, non-redirectable behavior and agitation -safety precautions 1:1 -ok to continue wellbutrin long-term for anxiety with hopes that it will help with tobacco cessation -daily aspirin, statin  C/f septic thrombophlebitis vs. cellulitis -Follow-up UE  venous ultrasound -Blood cultures -Start Keflex 500mg  q6h  HTN -likely chronic undiagnosed -start labetalol 100mg  BID  Deconditioning Dysphagia Severe protein energy malnutrition, at  risk for refeeding Hypoglycemia -enteral nutrition -DW10 ggt, q4h glucose checks -thiamine, folate, MVI -Daily mg, phos, bmp -PT, OT, SLP  Aortic dissection type A with extension into R carotid artery s/p graft repair -post-op care per cardiology -con't enteral supplemental nutrition -hydralazine PRN for hypertension  Acute blood loss anemia Mild thrombocytopenia -monitor -transfuse for Hb<7 or hemodynamically significant bleeding -transfuse for platelets <10 unless bleeding  Bowel Regimen -Pt with frequent liquid Bms - Flexiseal placed -Miralax, colace   Best Practice (right click and "Reselect all SmartList Selections" daily)   Diet/type: tubefeeds DVT prophylaxis: LMWH GI prophylaxis: N/A Lines: N/A Foley:  removal ordered  Code Status:  full code Last date of multidisciplinary goals of care discussion [ ]   , MS4 11/11/20 10:43 AM

## 2020-11-11 NOTE — Progress Notes (Signed)
Physical Therapy Treatment Patient Details Name: Jesse [name] MRN: [id number] DOB: [birth year] Today's Date: 11/11/2020    History of Present Illness Pt is a 45 y.o. male admitted 11/04/20 with acute onset aphasia, blurry vision. CTA revealed acute aortic dissection. Pt with worsening o2 requirements requiring intubation 7/15, self extubated 7/16. 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  PMH: Anxiety, Depression, Essential hypertension.    PT Comments    Pt making slow, steady progress. Pt with significant mobility  and cognitive deficits. Will continue to work on sitting/standing balance and work toward ambulation. Did not feel pt would be able to stay OOB in chair due to restlessness.   Follow Up Recommendations  CIR     Equipment Recommendations  Other (comment);Wheelchair (measurements PT);Wheelchair cushion (measurements PT);Hospital bed    Recommendations for Other Services Rehab consult     Precautions / Restrictions Precautions Precautions: Fall;Other (comment) Precaution Comments: L inattention Restrictions Other Position/Activity Restrictions: sternal precautions    Mobility  Bed Mobility Overal bed mobility: Needs Assistance Bed Mobility: Supine to Sit;Sit to Supine     Supine to sit: Max assist;+2 for physical assistance Sit to supine: +2 for physical assistance;Max assist   General bed mobility comments: Assist for all aspects. Pt spontaneously bring legs off of bed and back up into the bed    Transfers Overall transfer level: Needs assistance Equipment used: 2 person hand held assist Transfers: Sit to/from Stand Sit to Stand: Mod assist;+2 safety/equipment         General transfer comment: Mod assist to initiate and then +2 min assist to complete stand  Ambulation/Gait             General Gait Details: Attempted side stepping at side of  bed but unable   Stairs             Wheelchair Mobility    Modified Rankin (Stroke Patients Only) Modified Rankin (Stroke Patients Only) Pre-Morbid Rankin Score: No symptoms Modified Rankin: Severe disability     Balance Overall balance assessment: Needs assistance Sitting-balance support: No upper extremity supported;Feet unsupported;Bilateral upper extremity supported;Feet supported Sitting balance-Leahy Scale: Zero Sitting balance - Comments: Pt leaning heavily forward at times and almost lying on top of thighs. Cues to "sit up" would result in pt correcting forward lean for a short time before leaning forward again.   Standing balance support: Bilateral upper extremity supported;During functional activity Standing balance-Leahy Scale: Poor Standing balance comment: +2 min assist with hand held with static standing                            Cognition Arousal/Alertness: Awake/alert Behavior During Therapy: Impulsive;Restless;Agitated Overall Cognitive Status: Difficult to assess Area of Impairment: Attention;Following commands;Awareness;Problem solving                   Current Attention Level: Focused   Following Commands: Follows one step commands inconsistently;Follows one step commands with increased time   Awareness: Intellectual Problem Solving: Slow processing;Decreased initiation;Difficulty sequencing;Requires verbal cues;Requires tactile cues General Comments: Pt following only a few 1 step commands. Pt did not answer any question asked including what his name is. Pt became agitated when trying to return him to supine and swung at this therapist with RUE. Lt inattention      Exercises      General Comments General comments (skin integrity, edema, etc.): VSS  Pertinent Vitals/Pain Pain Assessment: Faces Faces Pain Scale: No hurt    Home Living                      Prior Function            PT Goals (current  goals can now be found in the care plan section) Acute Rehab PT Goals Patient Stated Goal: unable to state Progress towards PT goals: Progressing toward goals    Frequency    Min 4X/week      PT Plan Current plan remains appropriate    Co-evaluation              AM-PAC PT "6 Clicks" Mobility   Outcome Measure  Help needed turning from your back to your side while in a flat bed without using bedrails?: A Lot Help needed moving from lying on your back to sitting on the side of a flat bed without using bedrails?: Total Help needed moving to and from a bed to a chair (including a wheelchair)?: Total Help needed standing up from a chair using your arms (e.g., wheelchair or bedside chair)?: Total Help needed to walk in hospital room?: Total Help needed climbing 3-5 steps with a railing? : Total 6 Click Score: 7    End of Session Equipment Utilized During Treatment: Oxygen Activity Tolerance: Patient tolerated treatment well Patient left: in bed;with call bell/phone within reach;with bed alarm set;with restraints reapplied;with nursing/sitter in room Nurse Communication: Mobility status PT Visit Diagnosis: Unsteadiness on feet (R26.81);Other abnormalities of gait and mobility (R26.89);Muscle weakness (generalized) (M62.81);Other symptoms and signs involving the nervous system (R29.898);Hemiplegia and hemiparesis Hemiplegia - Right/Left: Left Hemiplegia - dominant/non-dominant: Non-dominant Hemiplegia - caused by: Cerebral infarction     Time: 1000-1020 PT Time Calculation (min) (ACUTE ONLY): 20 min  Charges:  $Therapeutic Activity: 8-22 mins                     [facility] PT Acute Rehabilitation Services Pager [phone] Office [phone]    Angelina Ok [facility] 11/11/2020, 1:44 PM

## 2020-11-11 NOTE — Consult Note (Signed)
[facility]    Date of Admission:  11/04/2020     Total days of antibiotics 1   Vancomycin   (Periop IV ABX following [facility] 7/12 - 7/14)               Reason for Consult: Cellulitis L arm     Referring Provider: Chestine Spore  Primary Care Provider: Pcp, No    Assessment: Jesse [name] is a 45 y.o. male admitted with acute stroke symptoms in the setting of acute type a aortic dissection with extension up to the right carotid artery now s/p aortic arch replacement with subclavian bypass to carotid artery. His care has been complicated by delirium and respiratory failure in the ICU.  It was observed that he has evidence of cellulitis involving what appears to be an old antecubital peripheral IV site. There is diffuse induration without any evidence of organized infection, he is quite tender on exam with palpation over the site. Blood cultures are pending but no signs of systemic involvement or bacteremia at present. Temp max 99.8 last PM.   Will continue IV vancomycin for now to cover for MRSA and other skin pathogens.  Given new graft, would conservatively check imaging to ensure no septic thrombophlebitis present, which would warrant longer course of treatment. Ultrasound (though he may not tolerate and hold still) vs CT scan of the left arm.   Would use warm compresses if he allows this to remain in place.  He has a more distal PIV in this same arm that may need to be removed also if possible. Would avoid venipunctures to L arm for now until infection resolves.   Candida albicans growing from sputum aspirate likely colonizer and not pathogenic. Would not treat this.    Plan: Continue vancomycin. Follow blood cultures.   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)   Acute hypoxemic respiratory failure (HCC)   Elevated troponin   New onset left bundle branch block (LBBB)   Protein-calorie malnutrition, severe   Encephalopathy acute     aspirin  81 mg Per Tube Daily   bisacodyl  10 mg Oral Daily   Or   bisacodyl  10 mg Rectal Daily   buPROPion  75 mg Per Tube BID   cephALEXin  500 mg Per Tube Q6H   chlorhexidine  15 mL Mouth Rinse BID   Chlorhexidine Gluconate Cloth  6 each Topical Daily   clonazepam  2 mg Per Tube Q8H   docusate  100 mg Per Tube BID   feeding supplement (PROSource TF)  45 mL Per Tube TID   folic acid  1 mg Per Tube Daily   free water  100 mL Per Tube Q8H   labetalol  100 mg Per Tube BID   mouth rinse  15 mL Mouth Rinse BID   multivitamin  15 mL Per Tube Daily   polyethylene glycol  17 g Per Tube Daily   QUEtiapine  25 mg Per Tube BID   thiamine  100 mg Per Tube Daily   valproic acid  250 mg Per Tube TID    HPI: Jesse [name] is a 45 y.o. male admitted on 11/04/2020 after he developed acute  onset aphasia and blurry vision.   Patient is unable to participate in history - majority of which was obtained by chart review and supplemental history with his brother who is in the room. Long-standing tobacco and alcohol use who has not had routine medical care.  Also discussions from his wife documented about abuse of prescription medications and "other drugs." Malnourished and underweight likely due to the above.   Taken emergently to OR after imaging identified acute type A aortic dissection with involvement of the internal carotid extending up with large R posterior MCA infarction without hemorrhagic conversion. Now S/P ascending aortic arch replacement with subclavian bypass to R carotid artery.  Complicated post op course with delirium and respiratory failure requiring re-intubation. Currently extubated and remains in ICU for close hemodynamic monitoring of post op HTN  and precedex infusion for delerium/withdrawal.   We were called to assess possible phlebitis / cellulitis of the left AC where previous PIV was. Site was recently identified as being erythematous and tender to the patient (grimacing and groaning in response to palpation).  He has remained afebrile and no leukocytosis. Blood cultures have been drawn and pending. He has been started on vancomycin.     Review of Systems: Review of Systems  Unable to perform ROS: Mental status change   Past Medical History:  Diagnosis Date   Anxiety    Depression    Essential hypertension    S/P aortic dissection repair 11/05/2020   Straight graft replacement of ascending aorta and proximal transverse aortic arch with re-suspension of native aortic valve and open hemi arch distal anastomosis with aorta to right common carotid bypass and aorta to right subclavian bypass    Social History   Tobacco Use   Smoking status: Every Day    Types: Cigarettes  Substance Use Topics   Alcohol use: Yes   Drug use: Not Currently    History reviewed. No pertinent family history. No Known Allergies  OBJECTIVE: Blood pressure (!) 160/79, pulse 79, temperature 98.2 F (36.8 C), temperature source Oral, resp. rate 15, height 6\' 2"  (1.88 m), weight 61.2 kg, SpO2 99 %.   Physical Exam Vitals reviewed.  Constitutional:      Appearance: He is ill-appearing. He is not toxic-appearing.     Comments: Resting in bed, appears uncomfortable but calm.   HENT:     Mouth/Throat:     Mouth: No oral lesions.     Dentition: Normal dentition. No dental caries.  Eyes:     General: No scleral icterus. Cardiovascular:     Rate and Rhythm: Normal rate and regular rhythm.     Heart sounds: Normal heart sounds. No murmur heard. Pulmonary:     Effort: Pulmonary effort is normal.     Breath sounds: Normal breath sounds.  Abdominal:     General: There is no distension.     Palpations: Abdomen is soft.     Tenderness: There is  no abdominal tenderness.  Lymphadenopathy:     Cervical: No cervical adenopathy.  Skin:    General: Skin is warm and dry.     Capillary Refill: Capillary refill takes less than 2 seconds.     Findings: No rash.     Comments: Mid sternal incision appears clean and dry. Open to air.  L AC with diffuse induration and erythema. No fluctuance or signs of superficial abscess.  Neurological:     Mental Status: He is alert and oriented to person, place, and time.  Lab Results Lab Results  Component Value Date   WBC 5.1 11/10/2020   HGB 8.7 (L) 11/10/2020   HCT 27.5 (L) 11/10/2020   MCV 103.0 (H) 11/10/2020   PLT 138 (L) 11/10/2020    Lab Results  Component Value Date   CREATININE 0.89 11/11/2020   BUN 16 11/11/2020   NA 140 11/11/2020   K 3.8 11/11/2020   CL 103 11/11/2020   CO2 28 11/11/2020    Lab Results  Component Value Date   ALT 12 11/07/2020   AST 40 11/07/2020   ALKPHOS 41 11/07/2020   BILITOT 0.4 11/07/2020     Microbiology: Recent Results (from the past 240 hour(s))  Resp Panel by RT-PCR (Flu A&B, Covid) Nasopharyngeal Swab     Status: None   Collection Time: 11/04/20  7:34 PM   Specimen: Nasopharyngeal Swab; Nasopharyngeal(NP) swabs in vial transport medium  Result Value Ref Range Status   SARS Coronavirus 2 by RT PCR NEGATIVE NEGATIVE Final    Comment: (NOTE) SARS-CoV-2 target nucleic acids are NOT DETECTED.  The SARS-CoV-2 RNA is generally detectable in upper respiratory specimens during the acute phase of infection. The lowest concentration of SARS-CoV-2 viral copies this assay can detect is 138 copies/mL. A negative result does not preclude SARS-Cov-2 infection and should not be used as the sole basis for treatment or other patient management decisions. A negative result may occur with  improper specimen collection/handling, submission of specimen other than nasopharyngeal swab, presence of viral mutation(s) within the areas targeted by this  assay, and inadequate number of viral copies(<138 copies/mL). A negative result must be combined with clinical observations, patient history, and epidemiological information. The expected result is Negative.  Fact Sheet for Patients:  BloggerCourse.com  Fact Sheet for Healthcare Providers:  SeriousBroker.it  This test is no t yet approved or cleared by the [place] FDA and  has been authorized for detection and/or diagnosis of SARS-CoV-2 by FDA under an Emergency Use Authorization (EUA). This EUA will remain  in effect (meaning this test can be used) for the duration of the COVID-19 declaration under Section 564(b)(1) of the Act, 21 U.S.C.section 360bbb-3(b)(1), unless the authorization is terminated  or revoked sooner.       Influenza A by PCR NEGATIVE NEGATIVE Final   Influenza B by PCR NEGATIVE NEGATIVE Final    Comment: (NOTE) The Xpert Xpress SARS-CoV-2/FLU/RSV plus assay is intended as an aid in the diagnosis of influenza from Nasopharyngeal swab specimens and should not be used as a sole basis for treatment. Nasal washings and aspirates are unacceptable for Xpert Xpress SARS-CoV-2/FLU/RSV testing.  Fact Sheet for Patients: BloggerCourse.com  Fact Sheet for Healthcare Providers: SeriousBroker.it  This test is not yet approved or cleared by the [place] FDA and has been authorized for detection and/or diagnosis of SARS-CoV-2 by FDA under an Emergency Use Authorization (EUA). This EUA will remain in effect (meaning this test can be used) for the duration of the COVID-19 declaration under Section 564(b)(1) of the Act, 21 U.S.C. section 360bbb-3(b)(1), unless the authorization is terminated or revoked.  Performed at [facility], [address], [place], [place] [address]   Culture, Respiratory w Gram Stain     Status: None   Collection Time: 11/08/20   8:45 AM   Specimen: Tracheal Aspirate; Respiratory  Result Value Ref Range Status   Specimen Description TRACHEAL ASPIRATE  Final   Special Requests NONE  Final   Gram Stain   Final  ABUNDANT WBC PRESENT,BOTH PMN AND MONONUCLEAR RARE YEAST Performed at [facility], [address] [place], [place] [address]    Culture RARE CANDIDA ALBICANS  Final   Report Status 11/10/2020 FINAL  Final    Rexene AlbertsStephanie Charrisse Masley, MSN, NP-C [facility] Cell: [phone] Pager: [phone]  11/11/2020 1:09 PM

## 2020-11-11 NOTE — Progress Notes (Signed)
Pharmacy Consult Note  Pharmacy Consult for heparin Indication: superficial vein thrombosis involving L arm  Pharmacy consult for vancomycin Indication: cellulitis vs septic thrombophlebitis  No Known Allergies  Patient Measurements: Height: 6\' 2"  (188 cm) Weight: 61.2 kg (134 lb 14.7 oz) IBW/kg (Calculated) : 82.2 Heparin Dosing Weight: 61kg  Vital Signs: Temp: 98.2 F (36.8 C) (07/19 1210) Temp Source: Oral (07/19 1210) BP: 160/79 (07/19 0800) Pulse Rate: 79 (07/19 1210)  Labs: Recent Labs    11/09/20 0101 11/10/20 0603 11/11/20 1059  HGB  --  8.7*  --   HCT  --  27.5*  --   PLT  --  138*  --   CREATININE 0.98 0.93 0.89    Estimated Creatinine Clearance: 89.8 mL/min (by C-G formula based on SCr of 0.89 mg/dL).   Medical History: Past Medical History:  Diagnosis Date   Anxiety    Depression    Essential hypertension    S/P aortic dissection repair 11/05/2020   Straight graft replacement of ascending aorta and proximal transverse aortic arch with re-suspension of native aortic valve and open hemi arch distal anastomosis with aorta to right common carotid bypass and aorta to right subclavian bypass    Medications:  Medications Prior to Admission  Medication Sig Dispense Refill Last Dose   aspirin-acetaminophen-caffeine (EXCEDRIN MIGRAINE) 250-250-65 MG tablet Take 2 tablets by mouth every 6 (six) hours as needed for headache.   11/04/2020 at 0800   diphenhydrAMINE (BENADRYL) 25 MG tablet Take 50-75 mg by mouth every 6 (six) hours as needed for sleep.   11/04/2020 at 1500   Melatonin 10 MG TABS Take 10 mg by mouth at bedtime as needed (sleep).   11/03/2020   Potassium 95 MG TABS Take 95 mg by mouth daily as needed (cramps).   11/03/2020    Assessment: 45 year old male s/p aortic dissection repair with neurological complications noted. Patient now noted to have cellulitis vs thrombophlebitis and now ultrasound noted superficial vein thrombosis. New orders to start IV  heparin for SVT until further workup can be done. Patient received sq enoxaparin this morning so will forgo bolus.   No fevers noted, wbc normal at 5. ID consulted in case further workup and long duration of antibiotics is needed. For now will check cultures and continue vancomycin and cephalexin. ID to decide on de-escalating.   Vancomycin 1500mg  x1 then 1250 mg IV Q 12 hrs. Goal AUC 400-550. Expected AUC: 550 SCr used: 0.89   Goal of Therapy:  Heparin level 0.3-0.7 units/ml Monitor platelets by anticoagulation protocol: Yes   Plan:  Start heparin infusion at 900 units/hr Check anti-Xa level in 6 hours and daily while on heparin Continue to monitor H&H and platelets Vancomycin 1500mg  x1 then 1250mg  IV q12 hours  47 PharmD., BCPS Clinical Pharmacist 11/11/2020 12:37 PM

## 2020-11-11 NOTE — Progress Notes (Signed)
7 Days Post-Op Procedure(s) (LRB): 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 WITH REPLACEMENT OF ASCENDING AORTA AND HEMIARCH USING HEMASHIELD PLATINUM GRAFT AND HEMASHIELD GOLD 14 X GRAFT, RESUSPENSION OF NATIVE VALVE, AORTA TO RIGHT CAROTID BYPASS, AORTA TO RIGHT SUBCLAVIAN BYPASS (N/A) TRANSESOPHAGEAL ECHOCARDIOGRAM (TEE) (N/A) Subjective: delirium  Objective: Vital signs in last 24 hours: Temp:  [98 F (36.7 C)-99.8 F (37.7 C)] 98.2 F (36.8 C) (07/19 1210) Pulse Rate:  [71-85] 77 (07/19 1400) Cardiac Rhythm: Normal sinus rhythm (07/19 1210) Resp:  [0-26] 23 (07/19 1400) BP: (128-194)/(74-107) 144/84 (07/19 1400) SpO2:  [87 %-100 %] 100 % (07/19 1400) Weight:  [61.2 kg] 61.2 kg (07/19 0500)  Hemodynamic parameters for last 24 hours:    Intake/Output from previous day: 07/18 0701 - 07/19 0700 In: 2338.6 [I.V.:578.6; NG/GT:1760] Out: 1535 [Urine:1535] Intake/Output this shift: Total I/O In: 627.4 [I.V.:394.1; IV Piggyback:233.3] Out: -   General appearance: restless Neurologic: left arm paralysis. Moving all other extremities strongly but not to command Heart: regular rate and rhythm, S1, S2 normal, no murmur Lungs: clear to auscultation bilaterally Abdomen: soft, non-tender; bowel sounds normal Extremities: do not appear warm or tender to me Wound: chest incision healing well. Sternum stable.  Lab Results: Recent Labs    11/10/20 0603  WBC 5.1  HGB 8.7*  HCT 27.5*  PLT 138*   BMET:  Recent Labs    11/10/20 0603 11/11/20 1059  NA 140 140  K 3.6 3.8  CL 104 103  CO2 26 28  GLUCOSE 121* 103*  BUN 24* 16  CREATININE 0.93 0.89  CALCIUM 8.4* 8.7*    PT/INR: No results for input(s): LABPROT, INR in the last 72 hours. ABG    Component Value Date/Time   PHART 7.361 11/07/2020 0624   HCO3 27.7 11/07/2020 0624   TCO2 29 11/07/2020 0624   ACIDBASEDEF 1.0 11/05/2020 1305   O2SAT 88.0 11/07/2020 0624   CBG (last 3)  Recent Labs     11/11/20 0348 11/11/20 0853 11/11/20 1207  GLUCAP 92 114* 122*    Assessment/Plan: S/P Procedure(s) (LRB): 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 WITH REPLACEMENT OF ASCENDING AORTA AND HEMIARCH USING HEMASHIELD PLATINUM GRAFT AND HEMASHIELD GOLD 14 X GRAFT, RESUSPENSION OF NATIVE VALVE, AORTA TO RIGHT CAROTID BYPASS, AORTA TO RIGHT SUBCLAVIAN BYPASS (N/A) TRANSESOPHAGEAL ECHOCARDIOGRAM (TEE) (N/A)  POD 7 Hemodynamically stable but hypertensive overnight and BP trending up as less sedated. Started on labetalol down tube.  Respiratory status stable  Tolerating tube feeds.  Left upper extremity superficial thrombophlebitis of basilic and cephalic vein. Started on heparin.  Seen by ID for left arm cellulitis/phlebitis. Vanc started and on Keflex per tube. His left arm does not look that bad this afternoon.  Delirium multifactorial due to preop substance abuse, stroke, pump run with circ arrest, ICU. This should gradually get better. CCM has been adjusting sedation.   LOS: 6 days    Alleen Borne 11/11/2020

## 2020-11-11 NOTE — Progress Notes (Signed)
Pharmacy Consult Note  Pharmacy Consult for heparin Indication: superficial vein thrombosis involving L arm  Pharmacy consult for vancomycin Indication: cellulitis vs septic thrombophlebitis  No Known Allergies  Patient Measurements: Height: 6\' 2"  (188 cm) Weight: 61.2 kg (134 lb 14.7 oz) IBW/kg (Calculated) : 82.2 Heparin Dosing Weight: 61kg  Vital Signs: Temp: 98 F (36.7 C) (07/19 2000) Temp Source: Oral (07/19 2000) BP: 145/78 (07/19 2000) Pulse Rate: 82 (07/19 2000)  Labs: Recent Labs    11/09/20 0101 11/10/20 0603 11/11/20 1059 11/11/20 1914  HGB  --  8.7*  --   --   HCT  --  27.5*  --   --   PLT  --  138*  --   --   HEPARINUNFRC  --   --   --  0.11*  CREATININE 0.98 0.93 0.89  --      Estimated Creatinine Clearance: 89.8 mL/min (by C-G formula based on SCr of 0.89 mg/dL).   Medical History: Past Medical History:  Diagnosis Date   Anxiety    Depression    Essential hypertension    S/P aortic dissection repair 11/05/2020   Straight graft replacement of ascending aorta and proximal transverse aortic arch with re-suspension of native aortic valve and open hemi arch distal anastomosis with aorta to right common carotid bypass and aorta to right subclavian bypass    Medications:  Medications Prior to Admission  Medication Sig Dispense Refill Last Dose   aspirin-acetaminophen-caffeine (EXCEDRIN MIGRAINE) 250-250-65 MG tablet Take 2 tablets by mouth every 6 (six) hours as needed for headache.   11/04/2020 at 0800   diphenhydrAMINE (BENADRYL) 25 MG tablet Take 50-75 mg by mouth every 6 (six) hours as needed for sleep.   11/04/2020 at 1500   Melatonin 10 MG TABS Take 10 mg by mouth at bedtime as needed (sleep).   11/03/2020   Potassium 95 MG TABS Take 95 mg by mouth daily as needed (cramps).   11/03/2020    Assessment: 45 year old male s/p aortic dissection repair with neurological complications noted. Patient now noted to have cellulitis vs thrombophlebitis and  now ultrasound noted superficial vein thrombosis. New orders to start IV heparin for SVT until further workup can be done. Patient received sq enoxaparin this morning so will forgo bolus.   HL 0.11.  Goal of Therapy:  Heparin level 0.3-0.7 units/ml Monitor platelets by anticoagulation protocol: Yes   Plan:  Start heparin infusion at 1050 units/hr Check anti-Xa level in 6 hours and daily while on heparin Continue to monitor H&H and platelets  47, PharmD, [facility] Clinical Pharmacist Please see AMION for all Pharmacists' Contact Phone Numbers 11/11/2020, 8:36 PM

## 2020-11-12 ENCOUNTER — Inpatient Hospital Stay (HOSPITAL_COMMUNITY): Payer: Medicaid Other

## 2020-11-12 ENCOUNTER — Other Ambulatory Visit (HOSPITAL_COMMUNITY): Payer: Self-pay

## 2020-11-12 DIAGNOSIS — J9811 Atelectasis: Secondary | ICD-10-CM

## 2020-11-12 DIAGNOSIS — R5381 Other malaise: Secondary | ICD-10-CM

## 2020-11-12 DIAGNOSIS — I63511 Cerebral infarction due to unspecified occlusion or stenosis of right middle cerebral artery: Secondary | ICD-10-CM

## 2020-11-12 LAB — BASIC METABOLIC PANEL
Anion gap: 8 (ref 5–15)
BUN: 17 mg/dL (ref 6–20)
CO2: 27 mmol/L (ref 22–32)
Calcium: 8.7 mg/dL — ABNORMAL LOW (ref 8.9–10.3)
Chloride: 100 mmol/L (ref 98–111)
Creatinine, Ser: 0.99 mg/dL (ref 0.61–1.24)
GFR, Estimated: >60 mL/min (ref >60)
Glucose, Bld: 126 mg/dL — ABNORMAL HIGH (ref 70–99)
Potassium: 4.5 mmol/L (ref 3.5–5.1)
Sodium: 135 mmol/L (ref 135–145)

## 2020-11-12 LAB — CBC WITH DIFFERENTIAL/PLATELET
Abs Immature Granulocytes: 0.06 10*3/uL (ref 0.00–0.07)
Basophils Absolute: 0 10*3/uL (ref 0.0–0.1)
Basophils Relative: 0 %
Eosinophils Absolute: 0.2 10*3/uL (ref 0.0–0.5)
Eosinophils Relative: 3 %
HCT: 31.5 % — ABNORMAL LOW (ref 39.0–52.0)
Hemoglobin: 10.3 g/dL — ABNORMAL LOW (ref 13.0–17.0)
Immature Granulocytes: 1 %
Lymphocytes Relative: 14 %
Lymphs Abs: 1 10*3/uL (ref 0.7–4.0)
MCH: 33.2 pg (ref 26.0–34.0)
MCHC: 32.7 g/dL (ref 30.0–36.0)
MCV: 101.6 fL — ABNORMAL HIGH (ref 80.0–100.0)
Monocytes Absolute: 0.8 10*3/uL (ref 0.1–1.0)
Monocytes Relative: 11 %
Neutro Abs: 5 10*3/uL (ref 1.7–7.7)
Neutrophils Relative %: 71 %
Platelets: 191 10*3/uL (ref 150–400)
RBC: 3.1 MIL/uL — ABNORMAL LOW (ref 4.22–5.81)
RDW: 13.5 % (ref 11.5–15.5)
WBC: 7 10*3/uL (ref 4.0–10.5)
nRBC: 0 % (ref 0.0–0.2)

## 2020-11-12 LAB — GLUCOSE, CAPILLARY
Glucose-Capillary: 103 mg/dL — ABNORMAL HIGH (ref 70–99)
Glucose-Capillary: 105 mg/dL — ABNORMAL HIGH (ref 70–99)
Glucose-Capillary: 107 mg/dL — ABNORMAL HIGH (ref 70–99)
Glucose-Capillary: 116 mg/dL — ABNORMAL HIGH (ref 70–99)
Glucose-Capillary: 119 mg/dL — ABNORMAL HIGH (ref 70–99)
Glucose-Capillary: 123 mg/dL — ABNORMAL HIGH (ref 70–99)
Glucose-Capillary: 83 mg/dL (ref 70–99)
Glucose-Capillary: 98 mg/dL (ref 70–99)

## 2020-11-12 LAB — PHOSPHORUS: Phosphorus: 4 mg/dL (ref 2.5–4.6)

## 2020-11-12 LAB — MAGNESIUM: Magnesium: 2 mg/dL (ref 1.7–2.4)

## 2020-11-12 LAB — VALPROIC ACID LEVEL: Valproic Acid Lvl: 19 ug/mL — ABNORMAL LOW (ref 50.0–100.0)

## 2020-11-12 LAB — HIV ANTIBODY (ROUTINE TESTING W REFLEX): HIV Screen 4th Generation wRfx: NONREACTIVE

## 2020-11-12 LAB — HEPARIN LEVEL (UNFRACTIONATED): Heparin Unfractionated: <0.1 IU/mL — ABNORMAL LOW (ref 0.30–0.70)

## 2020-11-12 MED ORDER — HEPARIN (PORCINE) 25000 UT/250ML-% IV SOLN
1550.0000 [IU]/h | INTRAVENOUS | Status: DC
  Administered 2020-11-12: 1250 [IU]/h via INTRAVENOUS
  Administered 2020-11-13: 1550 [IU]/h via INTRAVENOUS
  Filled 2020-11-12 (×2): qty 250

## 2020-11-12 MED ORDER — AMLODIPINE BESYLATE 5 MG PO TABS: 2.5000 mg | ORAL_TABLET | Freq: Every day | ORAL | Status: DC

## 2020-11-12 MED ORDER — AMLODIPINE BESYLATE 5 MG PO TABS
2.5000 mg | ORAL_TABLET | Freq: Every day | ORAL | Status: DC
  Administered 2020-11-12 – 2020-11-13 (×2): 2.5 mg
  Filled 2020-11-12 (×2): qty 1

## 2020-11-12 MED ORDER — LABETALOL HCL 200 MG PO TABS
200.0000 mg | ORAL_TABLET | Freq: Three times a day (TID) | ORAL | Status: DC
  Administered 2020-11-12 – 2020-11-13 (×4): 200 mg
  Filled 2020-11-12 (×4): qty 1

## 2020-11-12 NOTE — Progress Notes (Signed)
NAME:  Jesse [name], MRN:  [id number], DOB:  1975-06-25, LOS: 7 ADMISSION DATE:  11/04/2020, CONSULTATION DATE:  7/14 REFERRING MD:  Dr. Cornelius Moras, CHIEF COMPLAINT:  hypoxia   History of Present Illness:   45 year old male with PMH as below, whish is significant for anxiety, depression, and tobacco abuse (Not treated medically; self medicated with ETOH). Admitted 7/12 w/ new stroke due to aortic dissection resulting in loss of flow to right carotid.,  Emergently underwent replacement of ascending aorta and proximal transverse aortic arch, Aorta to right common carotid bypass, and aorta to right subclavian bypass. Postoperatively he was transferred to the CVICU. 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   Had severe agitated delirium and then worsening oxygen requirements on am of 7/15 requiring intubation. PCCM asked to assist with care   Pertinent  Medical History   has a past medical history of Anxiety, Depression, Essential hypertension, and S/P aortic dissection repair (11/05/2020).  Significant Hospital Events: Including procedures, antibiotic start and stop dates in addition to other pertinent events   7/12 admit for aortic dissection, emergently for repair.  7/13. Agitated at times.  7/14 agitation worsened and he became hypoxemic requiring heated high flow and NRB simultaneously. Unable to sustain sats over 85%. PCCM called emergently to bedside by bedside RN.  Intubated. New Left BBB. Placed on fent and precedex.  7/15 PCCM assuming care. Added clonazepam  7/16 extubated to high flow nasal cannula.  Still having episodes of agitation. 7/18 weaned off precedex 7/19 SVT with c/f septic thrombophlebitis vs. cellulitis. Start Keflex and vancomycin 7/20 rapid response called due to unequal pupils. Head CT with new petechial hemorrhage and mildly progressed mass effect from 52mm to 69mm midline shift. Antibiotics discontinued per ID.   Interim History /  Subjective:  Opens eyes to voice, following commands. Speaking incoherently. Pupils equal on neuro exam. Decreasing D10 to 30 ml/hr, will monitor glucose response.   SVT in L antrecubital fossa. ID consulted for cellulitis vs. septic thrombophlebitis. Per their recs, discontinued antibiotics  Objective   Blood pressure (!) 154/94, pulse 75, temperature 99.5 F (37.5 C), temperature source Axillary, resp. rate 19, height 6\' 2"  (1.88 m), weight 62.3 kg, SpO2 100 %.        Intake/Output Summary (Last 24 hours) at 11/12/2020 1100 Last data filed at 11/12/2020 0900 Gross per 24 hour  Intake 2858.28 ml  Output 2150 ml  Net 708.28 ml    Filed Weights   11/10/20 0500 11/11/20 0500 11/12/20 0500  Weight: 62.2 kg 61.2 kg 62.3 kg    Examination: General: frail, chronically ill appearing man lying in bed in NAD, speaking incoherently HEENT: temporal wasting, NGT in place. PERRL. Pupils equal in size bilaterally.  Pulmonary: weak cough, CTAB on anterior exam, breathing comfortably on [place] Cardiac: S1S2, RRR  GU: condom cath in place Neuro: Responsive to voice. Follows commands. Moves all extremities spontaneously. Derm: Decreased erythema and warm in L antecubital fossa at prior IV site. Tender to palpation.  Resolved Hospital Problem list   Acute hypoxic respiratory failure  Assessment & Plan:   Resolving acute hypoxemic respiratory failure -pulmonary hygiene; remains high risk for reintubation with weak cough & dysphagia -wean FiO2 as able to maintain SpO2 >90%  Agitated delirium from acute metabolic encephalopathy requiring titration of sedative infusion  History of tobacco abuse Chronic anxiety disorder-- likely was self medicating  Acute right MCA stroke due to aortic dissection.   -  con't valproate -con't clonazepam q8h & lorazepam PRN -con't seroquel 25mg  BID  -requires restraints for impulsive, non-redirectable behavior and agitation -safety precautions 1:1 -ok to continue  wellbutrin long-term for anxiety with hopes that it will help with tobacco cessation -daily aspirin, statin -Neuro to manage any changes in response to CT Head findings. Will consult them about restarting heparin ggt for SVT in light of findings.   Superficial Venous Thrombosis C/f septic thrombophlebitis vs. cellulitis -Infectious disease consulted, appreciate their recommendations -s/p keflex 500mg  x4 doses, vanc for approx 24h  -Keflex and vancomycin discontinued per ID -Follow-up UE soft tissue ultrasound -BCx NG@<24h. Continue to monitor -heparin ggt held, will get neuro recs before restarting   HTN -likely chronic undiagnosed -Increase to labetalol 200mg  TID  Deconditioning Dysphagia Severe protein energy malnutrition, at risk for refeeding Hypoglycemia -enteral nutrition -Decrease DW10 rate, q4h glucose checks -thiamine, folate, MVI -Daily mg, phos, bmp -PT, OT, SLP  Aortic dissection type A with extension into R carotid artery s/p graft repair -post-op care per cardiology -con't enteral supplemental nutrition -hydralazine PRN for hypertension  Acute blood loss anemia Mild thrombocytopenia -monitor -transfuse for Hb<7 or hemodynamically significant bleeding -transfuse for platelets <10 unless bleeding  Bowel Regimen -Flexiseal removed by pt -Miralax, colace Best Practice (right click and "Reselect all SmartList Selections" daily)   Diet/type: tubefeeds DVT prophylaxis: LMWH GI prophylaxis: N/A Lines: N/A Foley:  removal ordered  Code Status:  full code Last date of multidisciplinary goals of care discussion [ ]   , MS4 11/12/20 11:00 AM

## 2020-11-12 NOTE — Progress Notes (Signed)
Physical Therapy Treatment Patient Details Name: Jesse [name] MRN: [id number] DOB: [birth year] Today's Date: 11/12/2020    History of Present Illness Pt is a 45 y.o. male admitted 11/04/20 with acute onset aphasia, blurry vision. CTA revealed acute aortic dissection. Pt with worsening o2 requirements requiring intubation 7/15, self extubated 7/16. 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  CTH on 7/20 for unequal pupils revealed interval development of petechial hemorrhage and mild progression of edema and mass effect with 29mm leftward shift. PMH: Anxiety, Depression, Essential hypertension.    PT Comments    Pt still distractable, generally unfocused, inconsistent following commands.  Emphasis on cognition, following direction, focus on task, transitions, sit to stands, pre gait/standing activity, following direction with simple grooming tasks from OT.    Follow Up Recommendations  CIR     Equipment Recommendations  Other (comment);Wheelchair (measurements PT);Wheelchair cushion (measurements PT);Hospital bed    Recommendations for Other Services       Precautions / Restrictions Precautions Precautions: Fall;Other (comment);Sternal Precaution Comments: L inattention, cortrak Restrictions Weight Bearing Restrictions: Yes Other Position/Activity Restrictions: sternal    Mobility  Bed Mobility Overal bed mobility: Needs Assistance Bed Mobility: Supine to Sit;Sit to Supine     Supine to sit: +2 for physical assistance;+2 for safety/equipment;Total assist Sit to supine: Max assist;+2 for safety/equipment;+2 for physical assistance   General bed mobility comments: total assist +2 to EOB due to lethargy, max assist to return supine    Transfers Overall transfer level: Needs assistance Equipment used: 2 person hand held assist Transfers: Sit to/from Stand Sit to Stand: Mod  assist;+2 safety/equipment         General transfer comment: Mod assist to initiate and then +2 min assist to complete stand, support to ensure not pushing with UEs  Ambulation/Gait Ambulation/Gait assistance: Mod assist;+2 physical assistance Gait Distance (Feet): 3 Feet (forward and back) Assistive device: 1 person hand held assist;2 person hand held assist Gait Pattern/deviations: Step-to pattern;Decreased step length - right;Decreased step length - left;Decreased stride length   Gait velocity interpretation: <1.31 ft/sec, indicative of household ambulator General Gait Details: uncoordinated, staggered steps with scissoring and overall instability   Stairs             Wheelchair Mobility    Modified Rankin (Stroke Patients Only) Modified Rankin (Stroke Patients Only) Modified Rankin: Severe disability     Balance Overall balance assessment: Needs assistance Sitting-balance support: No upper extremity supported;Feet supported Sitting balance-Leahy Scale: Poor Sitting balance - Comments: heavy foward lean at times, brief bouts of sitting upright but unable to sustain Postural control: Other (comment) (foward lean) Standing balance support: Bilateral upper extremity supported;During functional activity Standing balance-Leahy Scale: Poor Standing balance comment: +2 assist for static standing                            Cognition Arousal/Alertness: Lethargic Behavior During Therapy: Impulsive;Restless;Agitated Overall Cognitive Status: Difficult to assess Area of Impairment: Attention;Following commands;Awareness;Problem solving                   Current Attention Level: Focused   Following Commands: Follows one step commands inconsistently;Follows one step commands with increased time   Awareness: Intellectual Problem Solving: Slow processing;Decreased initiation;Difficulty sequencing;Requires verbal cues;Requires tactile cues General  Comments: following only a few simple commands, poor awareness. limited verbalizations during session      Exercises  General Comments General comments (skin integrity, edema, etc.): vss      Pertinent Vitals/Pain Pain Assessment: Faces Faces Pain Scale: No hurt    Home Living                      Prior Function            PT Goals (current goals can now be found in the care plan section) Acute Rehab PT Goals Patient Stated Goal: unable to state PT Goal Formulation: Patient unable to participate in goal setting Time For Goal Achievement: 11/23/20 Potential to Achieve Goals: Fair Progress towards PT goals: Progressing toward goals    Frequency    Min 4X/week      PT Plan Current plan remains appropriate    Co-evaluation PT/OT/SLP Co-Evaluation/Treatment: Yes Reason for Co-Treatment: Necessary to address cognition/behavior during functional activity PT goals addressed during session: Mobility/safety with mobility OT goals addressed during session: ADL's and self-care      AM-PAC PT "6 Clicks" Mobility   Outcome Measure  Help needed turning from your back to your side while in a flat bed without using bedrails?: A Lot Help needed moving from lying on your back to sitting on the side of a flat bed without using bedrails?: Total Help needed moving to and from a bed to a chair (including a wheelchair)?: Total Help needed standing up from a chair using your arms (e.g., wheelchair or bedside chair)?: Total Help needed to walk in hospital room?: Total Help needed climbing 3-5 steps with a railing? : Total 6 Click Score: 7    End of Session   Activity Tolerance: Patient tolerated treatment well Patient left: in bed;with call bell/phone within reach;with bed alarm set;with restraints reapplied;with nursing/sitter in room Nurse Communication: Mobility status PT Visit Diagnosis: Unsteadiness on feet (R26.81);Other abnormalities of gait and mobility  (R26.89);Muscle weakness (generalized) (M62.81);Other symptoms and signs involving the nervous system (R29.898);Hemiplegia and hemiparesis Hemiplegia - Right/Left: Left Hemiplegia - dominant/non-dominant: Non-dominant Hemiplegia - caused by: Cerebral infarction     Time: 2130-8657 PT Time Calculation (min) (ACUTE ONLY): 23 min  Charges:  $Therapeutic Activity: 8-22 mins                     11/12/2020  Jacinto Halim., PT Acute Rehabilitation Services [phone]  (pager) [phone]  (office)11/12/2020  Jacinto Halim., PT Acute Rehabilitation Services [phone]  (pager) [phone]  (office)   Eliseo Gum Nora Rooke 11/12/2020, 1:07 PM

## 2020-11-12 NOTE — Progress Notes (Signed)
eLink Physician-Brief Progress Note Patient Name: Jesse [name] DOB: January 06, 1976 MRN: [id number]   Date of Service  11/12/2020  HPI/Events of Note  Patient with a change in his mental status as well as pupillary exam, pupils asymmetric but reactive bilaterally per bedside RN, patient more confused and belligerent, they are unable to assess him utilizing the NIH stroke scale because he is not cooperative and lucid enough, he did receive a 2 mg Ativan dose about the time that the change was noticed but it is not clear which preceded the other.  eICU Interventions  Hold Heparin gtt, stat CT of the brain r/o ICH, avoid further sedatives pending brain imaging.        Thomasene Lot Analaya Hoey 11/12/2020, 6:16 AM

## 2020-11-12 NOTE — Progress Notes (Signed)
Occupational Therapy Treatment Patient Details Name: Rod Majerus MRN: [id number] DOB: [birth year] Today's Date: 11/12/2020    History of present illness Pt is a 45 y.o. male admitted 11/04/20 with acute onset aphasia, blurry vision. CTA revealed acute aortic dissection. Pt with worsening o2 requirements requiring intubation 7/15, self extubated 7/16. 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  CTH on 7/20 for unequal pupils revealed interval development of petechial hemorrhage and mild progression of edema and mass effect with 59mm leftward shift. PMH: Anxiety, Depression, Essential hypertension.   OT comments  Pt sidelying in bed with sitter present, cleared for OT/PT from RN. Requires total assist to sit EOB, total assist for washing face and combing hair at EOB with and over hand assist using R hand.  Able to scan towards L side with max cueing and increased time, educated sitter to encouraged family to sit on L side when they return. Remains limited by cognition, balance and weakness.  Will follow acutely.    Follow Up Recommendations  CIR;Supervision/Assistance - 24 hour    Equipment Recommendations  Other (comment) (TBD at next venue)    Recommendations for Other Services Rehab consult    Precautions / Restrictions Precautions Precautions: Fall;Other (comment);Sternal Precaution Comments: L inattention, cortrak Restrictions Weight Bearing Restrictions: Yes Other Position/Activity Restrictions: sternal       Mobility Bed Mobility Overal bed mobility: Needs Assistance Bed Mobility: Supine to Sit;Sit to Supine     Supine to sit: +2 for physical assistance;+2 for safety/equipment;Total assist Sit to supine: Max assist;+2 for safety/equipment;+2 for physical assistance   General bed mobility comments: total assist +2 to EOB due to lethargy, max assist to return supine     Transfers Overall transfer level: Needs assistance Equipment used: 2 person hand held assist Transfers: Sit to/from Stand Sit to Stand: Mod assist;+2 safety/equipment         General transfer comment: Mod assist to initiate and then +2 min assist to complete stand, support to ensure not pushing with UEs    Balance Overall balance assessment: Needs assistance Sitting-balance support: No upper extremity supported;Feet supported Sitting balance-Leahy Scale: Poor Sitting balance - Comments: heavy foward lean at times, brief bouts of sitting upright but unable to sustain Postural control: Other (comment) (foward lean) Standing balance support: Bilateral upper extremity supported;During functional activity Standing balance-Leahy Scale: Poor Standing balance comment: +2 assist for static standing                           ADL either performed or assessed with clinical judgement   ADL Overall ADL's : Needs assistance/impaired Eating/Feeding: NPO   Grooming: Total assistance;Standing;Sitting;Wash/dry face;Brushing hair Grooming Details (indicate cue type and reason): EOB R hand with hand over hand support total assist             Lower Body Dressing: Total assistance;+2 for safety/equipment;+2 for physical assistance;Sit to/from stand               Functional mobility during ADLs: Maximal assistance;+2 for physical assistance;+2 for safety/equipment;Cueing for safety;Cueing for sequencing       Vision   Vision Assessment?: Vision impaired- to be further tested in functional context Additional Comments: R gaze preference, able to scan towards L side with max cueing and increased time   Perception     Praxis      Cognition Arousal/Alertness: Lethargic Behavior During Therapy: Impulsive;Restless;Agitated  Overall Cognitive Status: Difficult to assess Area of Impairment: Attention;Following commands;Awareness;Problem solving                    Current Attention Level: Focused   Following Commands: Follows one step commands inconsistently;Follows one step commands with increased time   Awareness: Intellectual Problem Solving: Slow processing;Decreased initiation;Difficulty sequencing;Requires verbal cues;Requires tactile cues General Comments: following only a few simple commands, poor awareness. limited verbalizations during session        Exercises     Shoulder Instructions       General Comments VSS    Pertinent Vitals/ Pain       Pain Assessment: Faces Faces Pain Scale: No hurt  Home Living                                          Prior Functioning/Environment              Frequency  Min 3X/week        Progress Toward Goals  OT Goals(current goals can now be found in the care plan section)  Progress towards OT goals: Not progressing toward goals - comment (lethargy)  Acute Rehab OT Goals Patient Stated Goal: unable to state OT Goal Formulation: Patient unable to participate in goal setting  Plan Discharge plan remains appropriate;Frequency remains appropriate    Co-evaluation    PT/OT/SLP Co-Evaluation/Treatment: Yes Reason for Co-Treatment: Necessary to address cognition/behavior during functional activity;For patient/therapist safety;To address functional/ADL transfers   OT goals addressed during session: ADL's and self-care      AM-PAC OT "6 Clicks" Daily Activity     Outcome Measure   Help from another person eating meals?: Total Help from another person taking care of personal grooming?: Total Help from another person toileting, which includes using toliet, bedpan, or urinal?: Total Help from another person bathing (including washing, rinsing, drying)?: Total Help from another person to put on and taking off regular upper body clothing?: Total Help from another person to put on and taking off regular lower body clothing?: Total 6 Click Score: 6    End of  Session Equipment Utilized During Treatment: Oxygen  OT Visit Diagnosis: Other abnormalities of gait and mobility (R26.89);Muscle weakness (generalized) (M62.81);Cognitive communication deficit (R41.841);Other symptoms and signs involving cognitive function Symptoms and signs involving cognitive functions: Cerebral infarction   Activity Tolerance Patient tolerated treatment well   Patient Left in bed;with call bell/phone within reach;with bed alarm set;with restraints reapplied;with nursing/sitter in room   Nurse Communication Mobility status        Time: 1518-3437 OT Time Calculation (min): 23 min  Charges: OT General Charges $OT Visit: 1 Visit OT Treatments $Self Care/Home Management : 8-22 mins  Barry Brunner, OT Acute Rehabilitation Services Pager [phone] Office [phone]    Chancy Milroy 11/12/2020, 12:46 PM

## 2020-11-12 NOTE — Progress Notes (Signed)
Inpatient Rehab Admissions Coordinator:   Continuing to follow.  Stopped by room today, sitter at bedside and father present.  Pt sleeping soundly.  Note lethargic today and pupils unequal, CT results show evolution of CVA.    Estill Dooms, PT, DPT Admissions Coordinator [phone] 11/12/20  12:40 PM

## 2020-11-12 NOTE — Progress Notes (Signed)
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 Moving spontaneously, not cooperative with exam  Objective: Vital signs in last 24 hours: Temp:  [97.9 F (36.6 C)-99.5 F (37.5 C)] 99.5 F (37.5 C) (07/20 0800) Pulse Rate:  [69-85] 75 (07/20 0800) Cardiac Rhythm: Normal sinus rhythm (07/20 0800) Resp:  [15-25] 19 (07/20 0800) BP: (128-180)/(74-108) 154/94 (07/20 0800) SpO2:  [97 %-100 %] 100 % (07/20 0800) Weight:  [62.3 kg] 62.3 kg (07/20 0500)  Hemodynamic parameters for last 24 hours:    Intake/Output from previous day: 07/19 0701 - 07/20 0700 In: 2858.3 [I.V.:1174.9; NG/GT:1200; IV Piggyback:483.4] Out: 1250 [Urine:1250] Intake/Output this shift: No intake/output data recorded.  General appearance: uncooperative Neurologic: delirium, pupils L>R, but both reactive Heart: regular rate and rhythm Lungs: clear to auscultation bilaterally Wound: intact  Lab Results: Recent Labs    11/10/20 0603 11/12/20 0422  WBC 5.1 7.0  HGB 8.7* 10.3*  HCT 27.5* 31.5*  PLT 138* 191   BMET:  Recent Labs    11/11/20 1059 11/12/20 0422  NA 140 135  K 3.8 4.5  CL 103 100  CO2 28 27  GLUCOSE 103* 126*  BUN 16 17  CREATININE 0.89 0.99  CALCIUM 8.7* 8.7*    PT/INR: No results for input(s): LABPROT, INR in the last 72 hours. ABG    Component Value Date/Time   PHART 7.361 11/07/2020 0624   HCO3 27.7 11/07/2020 0624   TCO2 29 11/07/2020 0624   ACIDBASEDEF 1.0 11/05/2020 1305   O2SAT 88.0 11/07/2020 0624   CBG (last 3)  Recent Labs    11/12/20 0420 11/12/20 0613 11/12/20 0749  GLUCAP 119* 123* 83    Assessment/Plan: S/P Procedure(s)  (LRB): 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 WITH REPLACEMENT OF ASCENDING AORTA AND HEMIARCH USING HEMASHIELD PLATINUM GRAFT AND HEMASHIELD GOLD 14 X GRAFT, RESUSPENSION OF NATIVE VALVE, AORTA TO RIGHT CAROTID BYPASS, AORTA TO RIGHT SUBCLAVIAN BYPASS (N/A) TRANSESOPHAGEAL ECHOCARDIOGRAM (TEE) (N/A) - NEURO- delirium- c/w TME ,likely multifactorial- stroke, CPB with circ arrest, hx of substance abuse   Head CT this AM for possible change in pupil size IMPRESSION: In comparison to CT head from 11/06/2020, interval development of petechial hemorrhage associated with the evolving posterior right MCA and MCA/PCA watershed territory infarct. No mass occupying hemorrhagic transformation. Cytotoxic edema and mass effect is mildly progressed with approximately 4 mm of leftward midline shift at the foramen of Monro (previously 3 mm when remeasured).     Electronically Signed   By: Jesse Harts MD   On: 11/12/2020 08:02  Will defer to Neurology re: any changes  in response to findings  CV- in SR. BP moderately elevated, on Labetalol via tube and PRN IV Labetalol and hydralazine  RESP- good saturation on 2L , last CXR several days ago, will recheck in AM  RENAL- creatinine and lytes OK  ENDO- CBG well controlled  ID- on Vanco and cephalexin for thrombophlebitis, exam improved compared to previously documented     LOS: 7 days    Jesse [name] 11/12/2020

## 2020-11-12 NOTE — Progress Notes (Addendum)
[facility]  Date of Admission:  11/04/2020      Total days of antibiotics 2  Vancomycin   Cephalexin PO           ASSESSMENT: Jesse [name] is a 45 y.o. male with recent aortic graft replacement and subclavian-R carotid bypass with phlebitis/cellulitis of L AC associated from previous PIV.   Vascular ultrasound with 2 acute superficial vein thrombosis involving left basilic and cephalic veins. Exam appears to be greatly improved after starting vancomycin and removing IV. Blood cultures are no growth preliminarily at 24h.  Suspect that if these remain clear he can finish out a short course of treatment. Will check in on arm tomorrow - continue vancomycin today, can stop keflex.     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)   Acute hypoxemic respiratory failure (HCC)   Elevated troponin   New onset left bundle branch block (LBBB)   Protein-calorie malnutrition, severe   Encephalopathy acute   Septic thrombophlebitis   Polysubstance abuse (HCC)    amLODipine  2.5 mg Per Tube Daily   aspirin  81 mg Per Tube Daily   bisacodyl  10 mg Oral Daily   Or   bisacodyl  10 mg Rectal Daily   buPROPion  75 mg Per Tube BID   chlorhexidine  15 mL Mouth Rinse BID   Chlorhexidine Gluconate Cloth  6 each Topical Daily   clonazepam  2 mg Per Tube Q8H   docusate  100 mg Per Tube BID   feeding supplement (PROSource TF)  45 mL Per Tube TID   folic acid  1 mg Per Tube Daily   free water  100 mL Per Tube Q8H   labetalol  200 mg Per Tube TID   mouth rinse  15 mL Mouth Rinse BID   multivitamin  15 mL Per Tube Daily   polyethylene glycol  17 g Per Tube Daily   QUEtiapine  25 mg Per  Tube BID   thiamine  100 mg Per Tube Daily   valproic acid  250 mg Per Tube TID    SUBJECTIVE: Nonverbal     Review of Systems: Review of Systems  Unable to perform ROS: Patient nonverbal   No Known Allergies  OBJECTIVE: Vitals:   11/12/20 0500 11/12/20 0600 11/12/20 0700 11/12/20 0800  BP:  (!) 167/92 (!) 176/103 (!) 154/94  Pulse:  81  75  Resp:    19  Temp:   97.9 F (36.6 C) 99.5 F (37.5 C)  TempSrc:   Oral Axillary  SpO2:  97%  100%  Weight: 62.3 kg     Height:       Body mass index is 17.63 kg/m.  Physical Exam Vitals and nursing note reviewed.  Constitutional:      General: He is not in acute distress.    Appearance: He is ill-appearing. He is not toxic-appearing.  HENT:     Mouth/Throat:     Mouth: Mucous membranes are dry.     Pharynx: Oropharynx is clear.  Cardiovascular:     Rate and Rhythm: Normal rate and regular rhythm.  Pulmonary:     Effort: Pulmonary effort is  normal.     Breath sounds: Normal breath sounds.  Skin:    General: Skin is warm and dry.     Comments: L AC is much improved today with completely resolved erythema. Non-tender on exam.   Neurological:     Mental Status: He is disoriented.    Lab Results Lab Results  Component Value Date   WBC 7.0 11/12/2020   HGB 10.3 (L) 11/12/2020   HCT 31.5 (L) 11/12/2020   MCV 101.6 (H) 11/12/2020   PLT 191 11/12/2020    Lab Results  Component Value Date   CREATININE 0.99 11/12/2020   BUN 17 11/12/2020   NA 135 11/12/2020   K 4.5 11/12/2020   CL 100 11/12/2020   CO2 27 11/12/2020    Lab Results  Component Value Date   ALT 12 11/07/2020   AST 40 11/07/2020   ALKPHOS 41 11/07/2020   BILITOT 0.4 11/07/2020     Microbiology: Recent Results (from the past 240 hour(s))  Resp Panel by RT-PCR (Flu A&B, Covid) Nasopharyngeal Swab     Status: None   Collection Time: 11/04/20  7:34 PM   Specimen: Nasopharyngeal Swab; Nasopharyngeal(NP) swabs in vial transport medium  Result Value  Ref Range Status   SARS Coronavirus 2 by RT PCR NEGATIVE NEGATIVE Final    Comment: (NOTE) SARS-CoV-2 target nucleic acids are NOT DETECTED.  The SARS-CoV-2 RNA is generally detectable in upper respiratory specimens during the acute phase of infection. The lowest concentration of SARS-CoV-2 viral copies this assay can detect is 138 copies/mL. A negative result does not preclude SARS-Cov-2 infection and should not be used as the sole basis for treatment or other patient management decisions. A negative result may occur with  improper specimen collection/handling, submission of specimen other than nasopharyngeal swab, presence of viral mutation(s) within the areas targeted by this assay, and inadequate number of viral copies(<138 copies/mL). A negative result must be combined with clinical observations, patient history, and epidemiological information. The expected result is Negative.  Fact Sheet for Patients:  BloggerCourse.com  Fact Sheet for Healthcare Providers:  SeriousBroker.it  This test is no t yet approved or cleared by the [place] FDA and  has been authorized for detection and/or diagnosis of SARS-CoV-2 by FDA under an Emergency Use Authorization (EUA). This EUA will remain  in effect (meaning this test can be used) for the duration of the COVID-19 declaration under Section 564(b)(1) of the Act, 21 U.S.C.section 360bbb-3(b)(1), unless the authorization is terminated  or revoked sooner.       Influenza A by PCR NEGATIVE NEGATIVE Final   Influenza B by PCR NEGATIVE NEGATIVE Final    Comment: (NOTE) The Xpert Xpress SARS-CoV-2/FLU/RSV plus assay is intended as an aid in the diagnosis of influenza from Nasopharyngeal swab specimens and should not be used as a sole basis for treatment. Nasal washings and aspirates are unacceptable for Xpert Xpress SARS-CoV-2/FLU/RSV testing.  Fact Sheet for  Patients: BloggerCourse.com  Fact Sheet for Healthcare Providers: SeriousBroker.it  This test is not yet approved or cleared by the [place] FDA and has been authorized for detection and/or diagnosis of SARS-CoV-2 by FDA under an Emergency Use Authorization (EUA). This EUA will remain in effect (meaning this test can be used) for the duration of the COVID-19 declaration under Section 564(b)(1) of the Act, 21 U.S.C. section 360bbb-3(b)(1), unless the authorization is terminated or revoked.  Performed at [facility], [address] [place], [place] [address]   Culture, Respiratory  w Gram Stain     Status: None   Collection Time: 11/08/20  8:45 AM   Specimen: Tracheal Aspirate; Respiratory  Result Value Ref Range Status   Specimen Description TRACHEAL ASPIRATE  Final   Special Requests NONE  Final   Gram Stain   Final    ABUNDANT WBC PRESENT,BOTH PMN AND MONONUCLEAR RARE YEAST Performed at [facility], [address] [place], [place] [address]    Culture RARE CANDIDA ALBICANS  Final   Report Status 11/10/2020 FINAL  Final  Culture, blood (routine x 2)     Status: None (Preliminary result)   Collection Time: 11/11/20 11:00 AM   Specimen: BLOOD LEFT ARM  Result Value Ref Range Status   Specimen Description BLOOD LEFT ARM  Final   Special Requests   Final    BOTTLES DRAWN AEROBIC AND ANAEROBIC Blood Culture results may not be optimal due to an inadequate volume of blood received in culture bottles   Culture   Final    NO GROWTH < 24 HOURS Performed at [facility], [address] [place], [place] [address]    Report Status PENDING  Incomplete  Culture, blood (routine x 2)     Status: None (Preliminary result)   Collection Time: 11/11/20 11:06 AM   Specimen: BLOOD  Result Value Ref Range Status   Specimen Description BLOOD LEFT ANTECUBITAL  Final   Special Requests   Final    BOTTLES DRAWN AEROBIC  ONLY Blood Culture results may not be optimal due to an inadequate volume of blood received in culture bottles   Culture   Final    NO GROWTH < 24 HOURS Performed at [facility], [address] [place], [place] [address]    Report Status PENDING  Incomplete    Rexene Alberts, MSN, NP-C [facility] Cell: [phone] Pager: [phone]  11/12/2020  11:36 AM    I spent 10 min with the patient and review of recent lab/radiologic imaging during this encounter

## 2020-11-12 NOTE — Evaluation (Signed)
Clinical/Bedside Swallow Evaluation Patient Details  Name: Jesse [name] MRN: [id number] Date of Birth: [birth year]  Today's Date: 11/12/2020 Time: SLP Start Time (ACUTE ONLY): 1054 SLP Stop Time (ACUTE ONLY): 1114 SLP Time Calculation (min) (ACUTE ONLY): 20 min  Past Medical History:  Past Medical History:  Diagnosis Date   Anxiety    Depression    Essential hypertension    S/P aortic dissection repair 11/05/2020   Straight graft replacement of ascending aorta and proximal transverse aortic arch with re-suspension of native aortic valve and open hemi arch distal anastomosis with aorta to right common carotid bypass and aorta to right subclavian bypass   Past Surgical History:  Past Surgical History:  Procedure Laterality Date   ASCENDING AORTIC ROOT REPLACEMENT N/A 11/04/2020   Procedure: 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 WITH REPLACEMENT OF ASCENDING AORTA AND HEMIARCH USING HEMASHIELD PLATINUM GRAFT AND HEMASHIELD GOLD 14 X GRAFT, RESUSPENSION OF NATIVE VALVE, AORTA TO RIGHT CAROTID BYPASS, AORTA TO RIGHT SUBCLAVIAN BYPASS;  Surgeon: Purcell Nails, MD;  Location: [facility] OR;  Service: Open Heart Surgery;  Laterality: N/A;   TEE WITHOUT CARDIOVERSION N/A 11/04/2020   Procedure: TRANSESOPHAGEAL ECHOCARDIOGRAM (TEE);  Surgeon: Purcell Nails, MD;  Location: [facility] OR;  Service: Open Heart Surgery;  Laterality: N/A;   HPI:  Pt is a 45 y.o. male admitted 11/04/20 with acute onset aphasia, blurry vision. CTA revealed acute aortic dissection. Pt with worsening O2 requirements requiring intubation 7/15, self extubated 7/16. 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 CTH on 7/20 for unequal pupils revealed interval development of petechial hemorrhage and mild progression of edema and mass effect with 32mm leftward shift. PMH: Anxiety, Depression, Essential hypertension.   Assessment  / Plan / Recommendation Clinical Impression  Pt is lethargic this morning after having received seroquel around 10. He mumbles in response to a few questions but is not following commands. He is resistant to presentation of any boluses, attempted with multiple delivery methods and with a variety of cues. Would maintain NPO status with use of Cortrak for now. SLP will attempt to f/u for ongoing assessment of swallowing around administration of meds that could be making him drowsy. Will plan to asses cognitive-linguistic function at that time too when we may be able to get a better representation of his abilities. SLP Visit Diagnosis: Dysphagia, unspecified (R13.10)    Aspiration Risk  Severe aspiration risk;Risk for inadequate nutrition/hydration    Diet Recommendation NPO;Alternative means - temporary   Medication Administration: Via alternative means    Other  Recommendations Oral Care Recommendations: Oral care QID Other Recommendations: Have oral suction available   Follow up Recommendations Inpatient Rehab      Frequency and Duration min 2x/week  2 weeks       Prognosis Prognosis for Safe Diet Advancement: Good Barriers to Reach Goals: Cognitive deficits      Swallow Study   General HPI: Pt is a 45 y.o. male admitted 11/04/20 with acute onset aphasia, blurry vision. CTA revealed acute aortic dissection. Pt with worsening O2 requirements requiring intubation 7/15, self extubated 7/16. 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 CTH on 7/20 for unequal pupils revealed interval development of petechial hemorrhage and mild progression of edema and mass effect with 104mm leftward shift. PMH: Anxiety, Depression, Essential hypertension. Type of Study: Bedside Swallow Evaluation Previous Swallow Assessment:  none in chart Diet Prior to this Study: NPO;NG Tube Temperature Spikes Noted:  No Respiratory Status: Nasal cannula History of Recent Intubation: Yes Length of Intubations (days): 1 days Date extubated: 11/08/20 (self-extubated) Behavior/Cognition: Lethargic/Drowsy;Doesn't follow directions Oral Cavity Assessment:  (doesn't allow for assessment) Oral Care Completed by SLP: Yes (as much as pt would allow) Oral Cavity - Dentition: Poor condition;Missing dentition (anterior dentition viewed and appear to be broken - unsure if he has any full dentition at this time) Self-Feeding Abilities: Refused PO Patient Positioning: Upright in bed (with repeated repositioning) Baseline Vocal Quality: Normal    Oral/Motor/Sensory Function Overall Oral Motor/Sensory Function: Other (comment) (not following commands for assessment)   Ice Chips Ice chips: Impaired Presentation: Spoon Oral Phase Impairments: Poor awareness of bolus   Thin Liquid Thin Liquid: Impaired Presentation: Straw Oral Phase Impairments: Poor awareness of bolus    Nectar Thick Nectar Thick Liquid: Not tested   Honey Thick Honey Thick Liquid: Not tested   Puree Puree: Impaired Presentation: Spoon Oral Phase Impairments: Poor awareness of bolus   Solid     Solid: Not tested      Mahala Menghini., M.A. CCC-SLP Acute Rehabilitation Services Pager [phone] Office [phone]  11/12/2020,11:34 AM

## 2020-11-12 NOTE — Progress Notes (Signed)
Pharmacy Consult Note  Pharmacy Consult for Heparin Indication: superficial vein thrombosis involving L arm    No Known Allergies  Patient Measurements: Height: 6\' 2"  (188 cm) Weight: 62.3 kg (137 lb 5.6 oz) IBW/kg (Calculated) : 82.2 Heparin Dosing Weight: 61kg  Vital Signs: Temp: 98.3 F (36.8 C) (07/20 0400) Temp Source: Oral (07/20 0000) BP: 162/87 (07/20 0430) Pulse Rate: 77 (07/20 0400)  Labs: Recent Labs    11/10/20 0603 11/11/20 1059 11/11/20 1914 11/12/20 0422  HGB 8.7*  --   --  10.3*  HCT 27.5*  --   --  31.5*  PLT 138*  --   --  191  HEPARINUNFRC  --   --  0.11* <0.10*  CREATININE 0.93 0.89  --   --      Estimated Creatinine Clearance: 91.4 mL/min (by C-G formula based on SCr of 0.89 mg/dL).   Medical History: Past Medical History:  Diagnosis Date   Anxiety    Depression    Essential hypertension    S/P aortic dissection repair 11/05/2020   Straight graft replacement of ascending aorta and proximal transverse aortic arch with re-suspension of native aortic valve and open hemi arch distal anastomosis with aorta to right common carotid bypass and aorta to right subclavian bypass    Medications:  Medications Prior to Admission  Medication Sig Dispense Refill Last Dose   aspirin-acetaminophen-caffeine (EXCEDRIN MIGRAINE) 250-250-65 MG tablet Take 2 tablets by mouth every 6 (six) hours as needed for headache.   11/04/2020 at 0800   diphenhydrAMINE (BENADRYL) 25 MG tablet Take 50-75 mg by mouth every 6 (six) hours as needed for sleep.   11/04/2020 at 1500   Melatonin 10 MG TABS Take 10 mg by mouth at bedtime as needed (sleep).   11/03/2020   Potassium 95 MG TABS Take 95 mg by mouth daily as needed (cramps).   11/03/2020    Assessment: 45 year old male s/p aortic dissection repair with neurological complications noted. Patient now noted to have cellulitis vs thrombophlebitis and now ultrasound noted superficial vein thrombosis. New orders to start IV  heparin for SVT until further workup can be done. Patient received sq enoxaparin this morning so will not bolus.   7/20 AM update:  Heparin level low Hgb 10.3  Goal of Therapy:  Heparin level 0.3-0.7 units/ml Monitor platelets by anticoagulation protocol: Yes   Plan:  Inc heparin to 1250 units/hr Re-check heparin level in 6-8 hours  8/20, PharmD, BCPS Clinical Pharmacist Phone: [phone]

## 2020-11-12 NOTE — Progress Notes (Signed)
Pharmacy Consult Note  Pharmacy Consult for Heparin Indication: superficial vein thrombosis involving L arm  No Known Allergies  Patient Measurements: Height: 6\' 2"  (188 cm) Weight: 62.3 kg (137 lb 5.6 oz) IBW/kg (Calculated) : 82.2 Heparin Dosing Weight: 61kg  Vital Signs: Temp: 98.8 F (37.1 C) (07/20 1600) Temp Source: Axillary (07/20 1600) BP: 110/75 (07/20 1300) Pulse Rate: 68 (07/20 1300)  Labs: Recent Labs    11/10/20 0603 11/11/20 1059 11/11/20 1914 11/12/20 0422  HGB 8.7*  --   --  10.3*  HCT 27.5*  --   --  31.5*  PLT 138*  --   --  191  HEPARINUNFRC  --   --  0.11* <0.10*  CREATININE 0.93 0.89  --  0.99     Estimated Creatinine Clearance: 82.2 mL/min (by C-G formula based on SCr of 0.99 mg/dL).   Medical History: Past Medical History:  Diagnosis Date   Anxiety    Depression    Essential hypertension    S/P aortic dissection repair 11/05/2020   Straight graft replacement of ascending aorta and proximal transverse aortic arch with re-suspension of native aortic valve and open hemi arch distal anastomosis with aorta to right common carotid bypass and aorta to right subclavian bypass    Medications:  Medications Prior to Admission  Medication Sig Dispense Refill Last Dose   aspirin-acetaminophen-caffeine (EXCEDRIN MIGRAINE) 250-250-65 MG tablet Take 2 tablets by mouth every 6 (six) hours as needed for headache.   11/04/2020 at 0800   diphenhydrAMINE (BENADRYL) 25 MG tablet Take 50-75 mg by mouth every 6 (six) hours as needed for sleep.   11/04/2020 at 1500   Melatonin 10 MG TABS Take 10 mg by mouth at bedtime as needed (sleep).   11/03/2020   Potassium 95 MG TABS Take 95 mg by mouth daily as needed (cramps).   11/03/2020    Assessment: 45 year old male s/p aortic dissection repair with neurological complications noted. Patient now noted to have cellulitis vs thrombophlebitis and now ultrasound noted superficial vein thrombosis. New orders to start IV  heparin for SVT until further workup can be done.   Patient noted to have petechial hemorrhage this am and heparin stopped. Neurology consulted and feels ok to continue anticoagulation. Will restart at previous rate.   Goal of Therapy:  Heparin level 0.3-0.7 units/ml Monitor platelets by anticoagulation protocol: Yes   Plan:  Restart heparin at 1250 units/hr Re-check heparin level in 6-8 hours  47 PharmD., BCPS Clinical Pharmacist 11/12/2020 5:28 PM

## 2020-11-12 NOTE — Progress Notes (Signed)
As RN gave lorazepam IV due to patient combativeness, this RN noted pupillary  changes at 0552. L side (3-30mm) being bigger than the R side (62mm) and pupils were reactive. Previously they were 33mm on previous assessments. NIHSS attempted but patient was non-compliant as he had been previous days. Rapid response was called and Elink was notified.  Per Ogan MD, heparin was stopped and CT orders placed.

## 2020-11-12 NOTE — Significant Event (Signed)
Rapid Response Event Note   Reason for Call :  Unequal pupils  Initial Focused Assessment:  Pt lying in bed with eyes closed. He will awaken to verbal stimulation. He has a R gaze preference, will not blink to threat, seems to be weaker on his L sided, and has some aphasia. NIH-18. L pupil is a 6 and sluggish, R is a 4 and sluggish.   T-98.3, HR-81, BP-167/92, RR-19, SpO2-98% on 4L [place]  Interventions:  CBG-123 Heparin gtt paused per MD order CT head STAT-1. Relatively large infarct with cytotoxic edema in the posterior Right MCA and MCA/PCA watershed territories. Mild mass effect including trace leftward midline shift. No hemorrhagic transformation. 2. Preserved gray-white matter differentiation elsewhere. Plan of Care:  Relay CT results to MD prior to restarting heparin gtt.    Event Summary:   MD Notified:  Dr. Warrick Parisian notified PTA RRT Call Time:0603 Arrival Time:0610 End Time:0700  Terrilyn Saver, RN

## 2020-11-12 NOTE — Progress Notes (Signed)
STROKE TEAM PROGRESS NOTE   SUBJECTIVE (INTERVAL HISTORY) Stroke team was called back to command on repeat CT scan findings and treatment plan.  His father is at the bedside.  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  Patient neurological exam is not significantly changed.  He can be aroused and follows few simple midline and right-sided commands.  He is able to withdraw the left leg to noxious stimuli but left upper extremity remains paralyzed Vital signs are stable.  . OBJECTIVE Temp:  [97.9 F (36.6 C)-99.5 F (37.5 C)] 98.8 F (37.1 C) (07/20 1600) Pulse Rate:  [68-85] 68 (07/20 1300) Cardiac Rhythm: Normal sinus rhythm (07/20 1200) Resp:  [16-25] 18 (07/20 1300) BP: (110-176)/(74-108) 110/75 (07/20 1300) SpO2:  [97 %-100 %] 100 % (07/20 1300) Weight:  [62.3 kg] 62.3 kg (07/20 0500)  Recent Labs  Lab 11/12/20 0420 11/12/20 0613 11/12/20 0749 11/12/20 1150 11/12/20 1600  GLUCAP 119* 123* 83 116* 98   Recent Labs  Lab 11/07/20 2111 11/08/20 0443 11/08/20 1648 11/09/20 0101 11/10/20 0603 11/11/20 1059 11/12/20 0422  NA  --  136  --  139 140 140 135  K 3.5 3.2* 4.6 4.1 3.6 3.8 4.5  CL  --  102  --  103 104 103 100  CO2  --  29  --  30 26 28 27   GLUCOSE  --  112*  --  96 121* 103* 126*  BUN  --  25*  --  24* 24* 16 17  CREATININE  --  1.11  --  0.98 0.93 0.89 0.99  CALCIUM  --  7.8*  --  7.9* 8.4* 8.7* 8.7*  MG 2.2 2.3 2.3  --   --  2.0 2.0  PHOS 3.3 2.9 2.9  --   --  4.0 4.0   Recent Labs  Lab 11/07/20 0031  AST 40  ALT 12  ALKPHOS 41  BILITOT 0.4  PROT 4.8*   ALBUMIN 2.5*   Recent Labs  Lab 11/06/20 0510 11/06/20 2003 11/06/20 2029 11/07/20 0031 11/07/20 0422 11/07/20 0624 11/10/20 0603 11/12/20 0422  WBC 8.2 7.8  --  7.9  --   --  5.1 7.0  NEUTROABS  --   --   --   --   --   --  3.6 5.0  HGB 8.7* 9.7*   < > 8.8* 7.8* 7.8* 8.7* 10.3*  HCT 26.0* 29.1*   < > 25.9* 23.0* 23.0* 27.5* 31.5*  MCV 102.4* 98.6  --  100.0  --   --  103.0* 101.6*  PLT 90* 94*  --  84*  --   --  138* 191   < > = values in this interval not displayed.   No results for input(s): CKTOTAL, CKMB, CKMBINDEX, TROPONINI in the last 168 hours. No results for input(s): LABPROT, INR in the last 72 hours.  No results for input(s): COLORURINE, LABSPEC, PHURINE, GLUCOSEU, HGBUR, BILIRUBINUR, KETONESUR, PROTEINUR, UROBILINOGEN, NITRITE, LEUKOCYTESUR in the last 72 hours.  Invalid input(s): APPERANCEUR     Component Value Date/Time   CHOL 112 11/06/2020 0510   TRIG 196 (H) 11/06/2020 0510   HDL 24 (  L) 11/06/2020 0510   CHOLHDL 4.7 11/06/2020 0510   VLDL 39 11/06/2020 0510   LDLCALC 49 11/06/2020 0510   Lab Results  Component Value Date   HGBA1C 5.2 11/06/2020   No results found for: LABOPIA, COCAINSCRNUR, LABBENZ, AMPHETMU, THCU, LABBARB  No results for input(s): ETH in the last 168 hours.   I have personally reviewed the radiological images below and agree with the radiology interpretations.  DG Chest 1 View  Result Date: 11/07/2020 CLINICAL DATA:  45 year old male with code stroke. Attempted to pull out endotracheal tube. EXAM: CHEST  1 VIEW COMPARISON:  Chest x-ray 11/06/2020. FINDINGS: An endotracheal tube is in place with tip 8.9 cm above the carina. A nasogastric tube is seen extending into the stomach, however, the tip of the nasogastric tube extends below the lower margin of the image. There is a surgical drain projecting over the medial aspect of the right hemithorax, either a mediastinal drain or medially placed right-sided chest tube. Lung volumes are  normal. Opacity in the left lower lobe, favored to represent postoperative atelectasis. Small left pleural effusion. Right lung is clear. No right pleural effusion. No appreciable pneumothorax. No evidence of pulmonary edema. Heart size is normal. Mediastinal contours are within normal limits. Surgical clips are noted to the right-side of the thoracic inlet. IMPRESSION: 1. Support apparatus, as above. 2. Probable atelectasis in the left lower lobe. 3. Small left pleural effusion. Electronically Signed   By: Trudie Reed M.D.   On: 11/07/2020 06:31   DG Abd 1 View  Result Date: 11/09/2020 CLINICAL DATA:  NG tube placement EXAM: ABDOMEN - 1 VIEW COMPARISON:  11/07/2020 FINDINGS: Limited radiograph of the lower chest and upper abdomen was obtained for the purposes of enteric tube localization. Enteric tube is seen coursing below the diaphragm with distal tip terminating within the expected location of the gastric body. IMPRESSION: Enteric tube tip terminating within the gastric body. Electronically Signed   By: Duanne Guess D.O.   On: 11/09/2020 10:52   CT HEAD WO CONTRAST  Result Date: 11/12/2020 CLINICAL DATA:  Neuro deficit, acute stroke suspected. EXAM: CT HEAD WITHOUT CONTRAST TECHNIQUE: Contiguous axial images were obtained from the base of the skull through the vertex without intravenous contrast. COMPARISON:  CT head 11/07/2019. 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 Interval development faint curvilinear gyral hyperdensity in this region, compatible with petechial hemorrhage. No mass occupying hemorrhagic transformation. Mass effect is mildly progressed with approximately 4 mm of leftward midline shift at the foramen of Monro. No evidence of new/interval acute large vascular territory infarct. No hydrocephalus. Basal cisterns are patent. No extra-axial fluid collection. Vascular: Better evaluated  on prior MRA. Skull: No acute fracture. Sinuses/Orbits: Polypoid mucosal thickening in the right sphenoid sinus and mild ethmoid air cell mucosal thickening. No acute orbital findings. Other: No mastoid effusions. 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 No mass occupying hemorrhagic transformation. Cytotoxic edema and mass effect is mildly progressed with approximately 4 mm of leftward midline shift at the foramen of Monro (previously 3 mm when remeasured). Electronically Signed   By: Feliberto Harts MD   On: 11/12/2020 08:02   CT HEAD WO CONTRAST  Result Date: 11/06/2020 CLINICAL DATA:  45 year old male code stroke presentation. Type a aortic dissection. Right carotid involvement and occlusion, distal right M2  occlusion. Subsequent encounter. EXAM: CT HEAD WITHOUT CONTRAST TECHNIQUE: Contiguous axial images were obtained from the base of the skull through the vertex without intravenous contrast. COMPARISON:  CTA head and neck, and CT headi 11/04/2020 FINDINGS: Brain: Confluent cytotoxic edema throughout a roughly 8 cm area of the posterior right hemisphere involving the posterior right MCA and right MCA/PCA watershed area. See sagittal image 15. Mild regional mass effect. Mild mass effect on the right lateral ventricle and trace leftward midline shift (series 3, image 23). No hemorrhagic transformation. Basilar cisterns remain patent. No ventriculomegaly. Deep gray nuclei appear stable. Left hemisphere and posterior fossa gray-white matter differentiation is stable. Vascular: No large vessel hyperdensity. Skull: Negative. Sinuses/Orbits: Visualized paranasal sinuses and mastoids are stable and well aerated. Other: Visualized orbits and scalp soft tissues are within normal limits. IMPRESSION: 1. Relatively large infarct with cytotoxic edema in the posterior Right  MCA and MCA/PCA watershed territories. Mild mass effect including trace leftward midline shift. No hemorrhagic transformation. 2. Preserved gray-white matter differentiation elsewhere. Electronically Signed   By: Odessa Fleming M.D.   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 Status post graft repair. EXAM: MRI HEAD WITHOUT CONTRAST MRA HEAD WITHOUT CONTRAST MRA NECK WITHOUT AND WITH CONTRAST TECHNIQUE: Multiplanar, multi-echo pulse sequences of the brain and surrounding structures were acquired without intravenous contrast. Angiographic images of the Circle of Willis were acquired using MRA technique without intravenous contrast. Angiographic images of the neck were acquired using MRA technique without and with intravenous contrast. Carotid stenosis measurements (when applicable) are obtained utilizing NASCET criteria, using the distal internal carotid diameter as the denominator. CONTRAST:  6.26mL GADAVIST GADOBUTROL 1 MMOL/ML IV SOLN COMPARISON:  Prior CTs from 11/06/2020 and 11/04/2020 FINDINGS: MRI HEAD FINDINGS Brain: Cerebral volume within normal limits. Few scattered subcentimeter foci of T2/FLAIR hyperintensity noted involving the periventricular and deep white matter both cerebral hemispheres, consistent with chronic microvascular ischemic disease, mild in nature. Moderately large area of restricted diffusion involving the right temporal occipital and parietal region, consistent with right MCA and MCA/PCA watershed infarct, relatively stable from prior CT. Associated gyral swelling and edema with regional mass effect. Associated right-to-left shift now measures 5 mm, slightly increased from prior. No associated hemorrhage. Multiple additional scattered subcentimeter acute ischemic infarcts noted  involving the bilateral cerebral hemispheres, embolic in nature. Largest of these additional foci seen at the high posterior right parietal region and measures 8 mm (series 5, image 94). Patchy involvement of the basal ganglia/corona radiata noted. Few additional punctate ischemic infarcts noted involving the right cerebellum. No associated hemorrhage or mass effect about these additional infarcts. Gray-white matter differentiation otherwise maintained. No other areas of chronic cortical infarction. No acute intracranial hemorrhage. Few scattered punctate chronic micro hemorrhages noted involving both cerebral hemispheres, likely related to underlying hypertension. No mass lesion. No hydrocephalus or ventricular trapping. Basilar cisterns remain patent. No extra-axial fluid collection. Pituitary gland suprasellar region normal. Midline structures intact. Vascular: Major intracranial vascular flow voids are maintained. Skull and upper cervical spine: Craniocervical junction within normal limits. Bone marrow signal intensity normal. No scalp soft tissue abnormality. Sinuses/Orbits: Globes and orbital soft tissues within normal limits. Scattered mucosal thickening noted throughout the ethmoidal air cells and maxillary sinuses. Fluid seen within the nasopharynx with small to moderate bilateral mastoid effusions. Patient is  intubated. Other: None. MRA HEAD FINDINGS Anterior circulation: Widely patent antegrade flow seen within the distal cervical segments of both internal carotid arteries, with interval re-establishment of flow within the cervical right ICA since prior. Both ICAs remain patent to the termini without definite stenosis. Apparent focal stenoses involving the anterior genus of both cavernous ICAs on MIP reconstructions favored to be artifactual in nature. A1 segments patent bilaterally. Normal anterior communicating artery complex. Anterior cerebral arteries patent to their distal aspects without stenosis.  No M1 stenosis or occlusion. Normal MCA bifurcations. Previously seen right MCA branch occlusions not definitely seen by MRA. Right MCA branches now appear well perfused and symmetric. Posterior circulation: Both V4 segments patent to the vertebrobasilar junction without stenosis. Right vertebral artery dominant. Partially visualized PICA patent. Basilar patent to its distal aspect without stenosis. Superior cerebellar arteries patent bilaterally. Right PCA primarily supplied via the basilar. Fetal type origin of the left PCA. Both PCAs remain well perfused to their distal aspects. Anatomic variants: Fetal type origin of the left PCA.  No aneurysm. 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 The visualized native arch is normal in caliber. The native left common carotid and subclavian arteries arise directly from the native arch. There has also been interval performance of an aorta to right subclavian bypass that supplies the right common and subclavian arteries. No visible stenosis or adverse features. Right carotid system: Right CCA now seen arising from the right subclavian bypass. Previously seen right CCA dissection has been repaired, with the right CCA now seen widely patent to the bifurcation. No significant atheromatous narrowing or irregularity about the right carotid bulb. Right ICA now patent and well perfused to the skull base without stenosis or occlusion. No MRA evidence for residual dissection. Left carotid system: Left CCA arises from the repaired native aortic arch and is widely patent to the bifurcation. No significant narrowing about the left carotid bulb. Left ICA patent distally to the skull base without stenosis, evidence for dissection or occlusion. Vertebral arteries: Left vertebral artery arises from the proximal left subclavian artery, just beyond its takeoff from the native aortic arch.  Right vertebral artery arises from the right subclavian bypass. 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 Vertebral arteries themselves are patent within the neck without stenosis, evidence for dissection, or occlusion. Right vertebral artery dominant. Other: None. IMPRESSION: MRI HEAD IMPRESSION: 1. 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 Associated regional mass effect with 5 mm of right-to-left shift, slightly increased from prior. 2. Additional scattered subcentimeter acute ischemic infarcts involving the bilateral cerebral hemispheres and right cerebellum, embolic in nature. No associated hemorrhage or mass effect. 3. No other acute intracranial abnormality. MRA HEAD IMPRESSION: 1. Interval re-establishment of flow within the right ICA, now patent to the terminus. Right MCA well perfused, with no visible persistent downstream occlusion evident by MRA. 2. Otherwise stable and unremarkable intracranial MRA. MRA NECK IMPRESSION: 1. 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 2. Right carotid artery system supplied via the right subclavian bypass, and is now widely patent without residual dissection or other abnormality. 3. Left carotid artery system supplied via the native aortic arch, and remains  widely patent without stenosis or other abnormality. 4. 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 Follow-up examination with dedicated CTA could be performed for further evaluation of this finding as warranted. 5. Both vertebral arteries remain widely patent within the neck. Right vertebral artery dominant.  Electronically Signed   By: Rise Mu M.D.   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 Status post graft repair. EXAM: MRI HEAD WITHOUT CONTRAST MRA HEAD WITHOUT CONTRAST MRA NECK WITHOUT AND WITH CONTRAST TECHNIQUE: Multiplanar, multi-echo pulse sequences of the brain and surrounding structures were acquired without intravenous contrast. Angiographic images of the Circle of Willis were acquired using MRA technique without intravenous contrast. Angiographic images of the neck were acquired using MRA technique without and with intravenous contrast. Carotid stenosis measurements (when applicable) are obtained utilizing NASCET criteria, using the distal internal carotid diameter as the denominator. CONTRAST:  6.48mL GADAVIST GADOBUTROL 1 MMOL/ML IV SOLN COMPARISON:  Prior CTs from 11/06/2020 and 11/04/2020 FINDINGS: MRI HEAD FINDINGS Brain: Cerebral volume within normal limits. Few scattered subcentimeter foci of T2/FLAIR hyperintensity noted involving the periventricular and deep white matter both cerebral hemispheres, consistent with chronic microvascular ischemic disease, mild in nature. Moderately large area of restricted diffusion involving the right temporal occipital and parietal region, consistent with right MCA and MCA/PCA watershed infarct, relatively stable from prior CT. Associated gyral swelling and edema with regional mass effect. Associated right-to-left shift now measures 5 mm, slightly increased from prior. No associated hemorrhage. Multiple additional scattered subcentimeter acute ischemic infarcts noted involving the bilateral cerebral hemispheres, embolic in nature. Largest of these additional foci seen at the high posterior right parietal region and measures 8 mm (series 5,  image 94). Patchy involvement of the basal ganglia/corona radiata noted. Few additional punctate ischemic infarcts noted involving the right cerebellum. No associated hemorrhage or mass effect about these additional infarcts. Gray-white matter differentiation otherwise maintained. No other areas of chronic cortical infarction. No acute intracranial hemorrhage. Few scattered punctate chronic micro hemorrhages noted involving both cerebral hemispheres, likely related to underlying hypertension. No mass lesion. No hydrocephalus or ventricular trapping. Basilar cisterns remain patent. No extra-axial fluid collection. Pituitary gland suprasellar region normal. Midline structures intact. Vascular: Major intracranial vascular flow voids are maintained. Skull and upper cervical spine: Craniocervical junction within normal limits. Bone marrow signal intensity normal. No scalp soft tissue abnormality. Sinuses/Orbits: Globes and orbital soft tissues within normal limits. Scattered mucosal thickening noted throughout the ethmoidal air cells and maxillary sinuses. Fluid seen within the nasopharynx with small to moderate bilateral mastoid effusions. Patient is intubated. Other: None. MRA HEAD FINDINGS Anterior circulation: Widely patent antegrade flow seen within the distal cervical segments of both internal carotid arteries, with interval re-establishment of flow within the cervical right ICA since prior. Both ICAs remain patent to the termini without definite stenosis. Apparent focal stenoses involving the anterior genus of both cavernous ICAs on MIP reconstructions favored to be artifactual in nature. A1 segments patent bilaterally. Normal anterior communicating artery complex. Anterior cerebral arteries patent to their distal aspects without stenosis. No M1 stenosis or occlusion. Normal MCA bifurcations. Previously seen right MCA branch occlusions not definitely seen by MRA. Right MCA branches now appear well perfused and  symmetric. Posterior circulation: Both  V4 segments patent to the vertebrobasilar junction without stenosis. Right vertebral artery dominant. Partially visualized PICA patent. Basilar patent to its distal aspect without stenosis. Superior cerebellar arteries patent bilaterally. Right PCA primarily supplied via the basilar. Fetal type origin of the left PCA. Both PCAs remain well perfused to their distal aspects. Anatomic variants: Fetal type origin of the left PCA.  No aneurysm. 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 The visualized native arch is normal in caliber. The native left common carotid and subclavian arteries arise directly from the native arch. There has also been interval performance of an aorta to right subclavian bypass that supplies the right common and subclavian arteries. No visible stenosis or adverse features. Right carotid system: Right CCA now seen arising from the right subclavian bypass. Previously seen right CCA dissection has been repaired, with the right CCA now seen widely patent to the bifurcation. No significant atheromatous narrowing or irregularity about the right carotid bulb. Right ICA now patent and well perfused to the skull base without stenosis or occlusion. No MRA evidence for residual dissection. Left carotid system: Left CCA arises from the repaired native aortic arch and is widely patent to the bifurcation. No significant narrowing about the left carotid bulb. Left ICA patent distally to the skull base without stenosis, evidence for dissection or occlusion. Vertebral arteries: Left vertebral artery arises from the proximal left subclavian artery, just beyond its takeoff from the native aortic arch. Right vertebral artery arises from the right subclavian bypass. 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 Vertebral arteries themselves are patent within the neck without stenosis, evidence for dissection, or occlusion. Right vertebral artery dominant. Other: None. IMPRESSION: MRI HEAD IMPRESSION: 1. 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 Associated regional mass effect with 5 mm of right-to-left shift, slightly increased from prior. 2. Additional scattered subcentimeter acute ischemic infarcts involving the bilateral cerebral hemispheres and right cerebellum, embolic in nature. No associated hemorrhage or mass effect. 3. No other acute intracranial abnormality. MRA HEAD IMPRESSION: 1. Interval re-establishment of flow within the right ICA, now patent to the terminus. Right MCA well perfused, with no visible persistent downstream occlusion evident by MRA. 2. Otherwise stable and unremarkable intracranial MRA. MRA NECK IMPRESSION: 1. 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 2. Right carotid artery system supplied via the right subclavian bypass, and is now widely patent without residual dissection or other abnormality. 3. Left carotid artery system supplied via the native aortic arch, and remains widely patent without stenosis or other abnormality. 4. 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 Follow-up examination with dedicated CTA could be performed for further evaluation of this finding as warranted. 5. Both vertebral arteries remain widely patent within the neck. Right vertebral artery dominant. Electronically Signed   By: Rise Mu M.D.   On: 11/07/2020 20:51   MR BRAIN WO CONTRAST  Result Date: 11/07/2020 CLINICAL DATA:  45 year old male with history  of type a aortic dissection with involvement of the innominate and right carotid artery system, with resultant right MCA/PCA infarct. Status post graft repair. EXAM: MRI HEAD  WITHOUT CONTRAST MRA HEAD WITHOUT CONTRAST MRA NECK WITHOUT AND WITH CONTRAST TECHNIQUE: Multiplanar, multi-echo pulse sequences of the brain and surrounding structures were acquired without intravenous contrast. Angiographic images of the Circle of Willis were acquired using MRA technique without intravenous contrast. Angiographic images of the neck were acquired using MRA technique without and with intravenous contrast. Carotid stenosis measurements (when applicable) are obtained utilizing NASCET criteria, using the distal internal carotid diameter as the denominator. CONTRAST:  6.23mL GADAVIST GADOBUTROL 1 MMOL/ML IV SOLN COMPARISON:  Prior CTs from 11/06/2020 and 11/04/2020 FINDINGS: MRI HEAD FINDINGS Brain: Cerebral volume within normal limits. Few scattered subcentimeter foci of T2/FLAIR hyperintensity noted involving the periventricular and deep white matter both cerebral hemispheres, consistent with chronic microvascular ischemic disease, mild in nature. Moderately large area of restricted diffusion involving the right temporal occipital and parietal region, consistent with right MCA and MCA/PCA watershed infarct, relatively stable from prior CT. Associated gyral swelling and edema with regional mass effect. Associated right-to-left shift now measures 5 mm, slightly increased from prior. No associated hemorrhage. Multiple additional scattered subcentimeter acute ischemic infarcts noted involving the bilateral cerebral hemispheres, embolic in nature. Largest of these additional foci seen at the high posterior right parietal region and measures 8 mm (series 5, image 94). Patchy involvement of the basal ganglia/corona radiata noted. Few additional punctate ischemic infarcts noted involving the right cerebellum. No associated hemorrhage or  mass effect about these additional infarcts. Gray-white matter differentiation otherwise maintained. No other areas of chronic cortical infarction. No acute intracranial hemorrhage. Few scattered punctate chronic micro hemorrhages noted involving both cerebral hemispheres, likely related to underlying hypertension. No mass lesion. No hydrocephalus or ventricular trapping. Basilar cisterns remain patent. No extra-axial fluid collection. Pituitary gland suprasellar region normal. Midline structures intact. Vascular: Major intracranial vascular flow voids are maintained. Skull and upper cervical spine: Craniocervical junction within normal limits. Bone marrow signal intensity normal. No scalp soft tissue abnormality. Sinuses/Orbits: Globes and orbital soft tissues within normal limits. Scattered mucosal thickening noted throughout the ethmoidal air cells and maxillary sinuses. Fluid seen within the nasopharynx with small to moderate bilateral mastoid effusions. Patient is intubated. Other: None. MRA HEAD FINDINGS Anterior circulation: Widely patent antegrade flow seen within the distal cervical segments of both internal carotid arteries, with interval re-establishment of flow within the cervical right ICA since prior. Both ICAs remain patent to the termini without definite stenosis. Apparent focal stenoses involving the anterior genus of both cavernous ICAs on MIP reconstructions favored to be artifactual in nature. A1 segments patent bilaterally. Normal anterior communicating artery complex. Anterior cerebral arteries patent to their distal aspects without stenosis. No M1 stenosis or occlusion. Normal MCA bifurcations. Previously seen right MCA branch occlusions not definitely seen by MRA. Right MCA branches now appear well perfused and symmetric. Posterior circulation: Both V4 segments patent to the vertebrobasilar junction without stenosis. Right vertebral artery dominant. Partially visualized PICA patent. Basilar  patent to its distal aspect without stenosis. Superior cerebellar arteries patent bilaterally. Right PCA primarily supplied via the basilar. Fetal type origin of the left PCA. Both PCAs remain well perfused to their distal aspects. Anatomic variants: Fetal type origin of the left PCA.  No aneurysm. 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 The visualized native arch is normal in caliber. The native left common carotid and subclavian arteries arise directly from the native arch. There has also been interval performance of an aorta to right  subclavian bypass that supplies the right common and subclavian arteries. No visible stenosis or adverse features. Right carotid system: Right CCA now seen arising from the right subclavian bypass. Previously seen right CCA dissection has been repaired, with the right CCA now seen widely patent to the bifurcation. No significant atheromatous narrowing or irregularity about the right carotid bulb. Right ICA now patent and well perfused to the skull base without stenosis or occlusion. No MRA evidence for residual dissection. Left carotid system: Left CCA arises from the repaired native aortic arch and is widely patent to the bifurcation. No significant narrowing about the left carotid bulb. Left ICA patent distally to the skull base without stenosis, evidence for dissection or occlusion. Vertebral arteries: Left vertebral artery arises from the proximal left subclavian artery, just beyond its takeoff from the native aortic arch. Right vertebral artery arises from the right subclavian bypass. 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 Vertebral arteries themselves are patent within the neck without stenosis, evidence for dissection, or occlusion. Right  vertebral artery dominant. Other: None. IMPRESSION: MRI HEAD IMPRESSION: 1. 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 Associated regional mass effect with 5 mm of right-to-left shift, slightly increased from prior. 2. Additional scattered subcentimeter acute ischemic infarcts involving the bilateral cerebral hemispheres and right cerebellum, embolic in nature. No associated hemorrhage or mass effect. 3. No other acute intracranial abnormality. MRA HEAD IMPRESSION: 1. Interval re-establishment of flow within the right ICA, now patent to the terminus. Right MCA well perfused, with no visible persistent downstream occlusion evident by MRA. 2. Otherwise stable and unremarkable intracranial MRA. MRA NECK IMPRESSION: 1. 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 2. Right carotid artery system supplied via the right subclavian bypass, and is now widely patent without residual dissection or other abnormality. 3. Left carotid artery system supplied via the native aortic arch, and remains widely patent without stenosis or other abnormality. 4. 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 Follow-up examination with dedicated CTA could be performed for further evaluation of this finding as warranted. 5. Both vertebral arteries remain widely patent within the neck. Right vertebral artery dominant. Electronically Signed   By: Rise Mu M.D.   On: 11/07/2020 20:51   DG CHEST PORT 1 VIEW  Result Date: 11/09/2020 CLINICAL DATA:  NG tube placement EXAM: PORTABLE CHEST 1 VIEW COMPARISON:  11/08/2020 FINDINGS: Enteric tube tip terminates at the level of the distal esophagus. Stable cardiomediastinal contours. Hazy bibasilar opacities,  slightly improved from prior. No pneumothorax is seen, although the lung apices were excluded from the field of view. IMPRESSION: 1. Enteric tube tip terminates at the level of the distal esophagus. Subsequent abdominal radiograph in PACS demonstrates interval advancement. 2. Improving aeration of the lung bases. Electronically Signed   By: Duanne Guess D.O.   On: 11/09/2020 10:52   DG Chest Port 1 View  Result Date: 11/08/2020 CLINICAL DATA:  Self extubation EXAM: PORTABLE CHEST 1 VIEW COMPARISON:  11/07/2020 FINDINGS: Single frontal view of the chest demonstrates interval removal of the endotracheal tube and enteric catheter. There is a linear radiopaque structure overlying the trachea at the C7 level, indeterminate in nature. Cardiac  silhouette is unremarkable. Bibasilar veiling opacities are identified, left greater than right, consistent with underlying consolidation and/or effusion. No pneumothorax. IMPRESSION: 1. Progressive bibasilar veiling opacities, left greater than right, consistent with underlying consolidation and effusions. 2. Interval removal of the endotracheal and enteric catheters. A short linear radiodensity projects over the trachea at the C7 level, of uncertain etiology. Electronically Signed   By: Sharlet Salina M.D.   On: 11/08/2020 17:06   DG CHEST PORT 1 VIEW  Result Date: 11/06/2020 CLINICAL DATA:  45 year old male status post intubation. EXAM: PORTABLE CHEST 1 VIEW COMPARISON:  Chest radiograph dated 11/06/2020. FINDINGS: Endotracheal tube with tip approximately 5.5 cm above the carina. Interval removal of the right IJ catheter. Two inferiorly accessed mediastinal drains again noted. Probable small left pleural effusion. Right infrahilar hazy density, possibly atelectasis. No obvious pneumothorax. Stable cardiomegaly. No acute osseous pathology. IMPRESSION: 1. Interval removal of the right IJ catheter. 2. Endotracheal tube above the carina. 3. Probable small left pleural  effusion. Electronically Signed   By: Elgie Collard M.D.   On: 11/06/2020 20:42   DG Chest Port 1 View  Addendum Date: 11/06/2020   ADDENDUM REPORT: 11/06/2020 09:13 ADDENDUM: Also with slight increased retrocardiac opacification and RIGHT basilar opacification since prior study. These findings and findings in the initial report were discussed with the provider as outlined below. These results were called by telephone at the time of interpretation on 11/06/2020 at 9:13 am to provider Tressie Stalker , who verbally acknowledged these results. Electronically Signed   By: Donzetta Kohut M.D.   On: 11/06/2020 09:13   Result Date: 11/06/2020 CLINICAL DATA:  Post aortic dissection repair in a 45 year old male. EXAM: PORTABLE CHEST 1 VIEW COMPARISON:  November 05, 2020. FINDINGS: EKG leads project over the chest. Since the previous study the patient has been extubated. Chest support tubes project over the heart terminating just below the clavicular heads with respect to the more rightward oriented tube, not changed in position. The LEFT-sided tube terminates over the LEFT chest in the retrocardiac region with similar appearance. RIGHT IJ sheath in similar position Swan-Ganz catheter now removed. Persistent tiny RIGHT apical pneumothorax, unchanged. Trachea midline. Cardiomediastinal contours and hilar structures are stable. Graded opacity at the RIGHT lung base is subtle with obscured LEFT hemidiaphragm along the medial aspect when compared to the prior study. Possible deep sulcus sign on the LEFT otherwise no evidence of LEFT-sided pneumothorax. On limited assessment no acute skeletal process. IMPRESSION: 1. Persistent tiny RIGHT apical pneumothorax. 2. Possible deep sulcus sign on the LEFT potentially a very small LEFT pneumothorax. Attention on follow-up. 3. Interval extubation, removal of gastric tube and removal of Swan-Ganz catheter, otherwise stable position of support apparatus. A call is out to the referring  provider to further discuss findings in the above case. Electronically Signed: By: Donzetta Kohut M.D. On: 11/06/2020 09:05   DG Chest Port 1 View  Result Date: 11/05/2020 CLINICAL DATA:  45 year old male with open heart surgery. EXAM: PORTABLE CHEST 1 VIEW COMPARISON:  Chest CT dated 11/04/2020. FINDINGS: Right IJ Swan-Ganz catheter with tip likely close to the bifurcation of the main pulmonary trunk. Endotracheal tube with tip approximately 6.5 cm above the carina. Enteric tube extends below the diaphragm with tip beyond the inferior margin of the image. Inferiorly accessed mediastinal drain. The lungs are clear. There is no pleural effusion. Tiny linear lucency in the right apex may be artifactual or represent minimal right pneumothorax measuring approximately 4 mm in thickness. Close follow-up  recommended. The cardiac silhouette is within limits. No acute osseous pathology. IMPRESSION: 1. Status post open heart surgery with support apparatus as above. 2. Possible tiny right apical pneumothorax. Attention on follow-up imaging recommended. These results were called by telephone at the time of interpretation on 11/05/2020 at 4:03 am to the patient's nurse Jess Barters, who verbally acknowledged these results. Electronically Signed   By: Elgie Collard M.D.   On: 11/05/2020 04:07   DG Abd Portable 1V  Result Date: 11/07/2020 CLINICAL DATA:  Feeding tube placement EXAM: PORTABLE ABDOMEN - 1 VIEW COMPARISON:  11/07/2020 at 0310 hours FINDINGS: Interval placement of large bore feeding tube which extends below the diaphragm, distal tip beyond the inferior margin of the film. Additional enteric tube is again seen terminating within the gastric body. Mediastinal drain and left basilar chest tube are seen. IMPRESSION: Interval placement of feeding tube which extends below the diaphragm into the stomach with distal tip beyond the inferior margin of the film. Electronically Signed   By: Duanne Guess D.O.   On:  11/07/2020 12:33   DG Abd Portable 1V  Result Date: 11/07/2020 CLINICAL DATA:  45 year male status post feeding tube placement. EXAM: PORTABLE ABDOMEN - 1 VIEW COMPARISON:  Chest radiograph dated 11/06/2020 and CT of the chest abdomen pelvis dated 11/04/2020. FINDINGS: Partially visualized enteric tube with side-port just distal to the GE junction and tip in the proximal body of the stomach. Inferiorly accessed mediastinal drains noted. No bowel dilatation or evidence of obstruction. No free air. Probable small left pleural effusion. IMPRESSION: Enteric tube with tip in the proximal body of the stomach. Electronically Signed   By: Elgie Collard M.D.   On: 11/07/2020 03:35   EEG adult  Result Date: 11/08/2020 Charlsie Quest, MD     11/08/2020  6:22 PM Patient Name: Daemian Gahm MRN: [id number] Epilepsy Attending: Charlsie Quest Referring Physician/Provider: Dr Delia Heady Date: 11/08/2020 Duration: 23.42 mins Patient history: 45yo M with R MCA infarct and ams. EEG to evaluate for seizure Level of alertness: Awake AEDs during EEG study: Clonazepam Technical aspects: This EEG study was done with scalp electrodes positioned according to the 10-20 International system of electrode placement. Electrical activity was acquired at a sampling rate of  and reviewed with a high frequency filter of  and a low frequency filter of . EEG data were recorded continuously and digitally stored. Description: No posterior dominant rhythm was seen. EEG showed continuous 5-8Hz  theta-alpha activity in left hemisphere and low amplitude 2-3h in right hemisphere. Hyperventilation and photic stimulation were not performed.   ABNORMALITY - Continuous slow, generalized and lateralized right hemisphere IMPRESSION: This study is suggestive of cortical dysfunction arising from right hemisphere likely secondary to underlying structural abnormality/stroke. Additionally, there is moderate diffuse encephalopathy,  nonspecific etiology. No seizures or epileptiform discharges were seen throughout the recording. Priyanka Annabelle Harman   ECHO INTRAOPERATIVE TEE  Result Date: 11/05/2020  *INTRAOPERATIVE TRANSESOPHAGEAL REPORT *  Patient Name:   AMERICO VALLERY Date of Exam: 11/04/2020 Medical Rec #:  [id number]     Height: Accession #:    [id number]    Weight:       145.5 lb Date of Birth:  Nov 20, 1975    BSA:          1.63 m Patient Age:    44 years      BP:           163/100 mmHg Patient Gender: M  HR:           70 bpm. Exam Location:  Inpatient Transesophogeal exam was perform intraoperatively during surgical procedure. Patient was closely monitored under general anesthesia during the entirety of examination. Indications:     Aortic dissection Performing Phys: [id number] CLARENCE H OWEN Diagnosing Phys: Lewie Loron MD Complications: No known complications during this procedure. POST-OP IMPRESSIONS - Left Ventricle: The left ventricle is unchanged from pre-bypass. - Right Ventricle: The right ventricle appears unchanged from pre-bypass. - Aorta: there is no dissection present in the aorta A graft was placed in the ascending aorta for repair. - Left Atrial Appendage: The left atrial appendage appears unchanged from pre-bypass. - Aortic Valve: The aortic valve appears unchanged from pre-bypass. - Mitral Valve: The mitral valve appears unchanged from pre-bypass. - Tricuspid Valve: The tricuspid valve appears unchanged from pre-bypass. PRE-OP FINDINGS  Left Ventricle: The left ventricle has normal systolic function, with an ejection fraction of 55-60%. The cavity size was normal. There is severe concentric left ventricular hypertrophy. Right Ventricle: The right ventricle has normal systolic function. The cavity was normal. There is no increase in right ventricular wall thickness. Left Atrium: Left atrial size was normal in size. No left atrial/left atrial appendage thrombus was detected. Right Atrium: Right atrial size was normal  in size. Interatrial Septum: The interatrial septum was not assessed. There is no evidence of a patent foramen ovale. Pericardium: Trivial pericardial effusion is present. Mitral Valve: The mitral valve is normal in structure. Mitral valve regurgitation is trivial by color flow Doppler. Tricuspid Valve: The tricuspid valve was normal in structure. Tricuspid valve regurgitation is trivial by color flow Doppler. Aortic Valve: The aortic valve is tricuspid Aortic valve regurgitation is trivial by color flow Doppler. There is no stenosis of the aortic valve. Pulmonic Valve: The pulmonic valve was normal in structure. Pulmonic valve regurgitation is not visualized by color flow Doppler. Aorta: There is evidence of a dissection in the aortic root, ascending aorta and aortic arch. Shunts: There is no evidence of an atrial septal defect.  Lewie Loron MD Electronically signed by Lewie Loron MD Signature Date/Time: 11/05/2020/2:44:37 AM    Final    CT Angio Chest/Abd/Pel for Dissection W and/or W/WO  Result Date: 11/04/2020 CLINICAL DATA:  Further evaluation of aortic dissection seen on prior same day imaging EXAM: CT ANGIOGRAPHY CHEST, ABDOMEN AND PELVIS TECHNIQUE: Non-contrast CT of the chest was initially obtained. Multidetector CT imaging through the chest, abdomen and pelvis was performed using the standard protocol during bolus administration of intravenous contrast. Multiplanar reconstructed images and MIPs were obtained and reviewed to evaluate the vascular anatomy. CONTRAST:  OMNIPAQUE IOHEXOL 350 MG/ML SOLN COMPARISON:  Same day CT neck. FINDINGS: CTA CHEST FINDINGS Cardiovascular: Ascending aortic dissection extending from superior to the sino-tubular junction to the transverse arch with continuation of the dissection into the innominate artery with thrombus in the false lumen of the innominate artery. No involvement of the descending thoracic aorta. The left main and right coronary arteries not  appear to be involved and are patent as are the portions of the LAD and left circumflex. Normal size heart. No significant pericardial effusion/thickening. No central pulmonary embolus. Mediastinum/Nodes: No mediastinal fluid collection. No discrete thyroid nodule. No pathologically enlarged mediastinal, hilar or axillary lymph nodes. The trachea and esophagus are grossly unremarkable. Lungs/Pleura: No suspicious pulmonary nodules or masses. No focal consolidation. No pleural effusion. No pneumothorax. Musculoskeletal: No acute osseous abnormality. Review of the MIP images confirms the above  findings. CTA ABDOMEN AND PELVIS FINDINGS VASCULAR Aorta: Aortic atherosclerosis. Normal caliber abdominal aorta without aneurysm, dissection, vasculitis or significant stenosis. Celiac: Patent without evidence of aneurysm, dissection, vasculitis or significant stenosis. SMA: Patent without evidence of aneurysm, dissection, vasculitis or significant stenosis. Renals: Both renal arteries are patent without evidence of aneurysm, dissection, vasculitis, fibromuscular dysplasia or significant stenosis. IMA: Patent without evidence of aneurysm, dissection, vasculitis or significant stenosis. Inflow: Patent without evidence of aneurysm, dissection, vasculitis or significant stenosis. Veins: No obvious venous abnormality within the limitations of this arterial phase study. Review of the MIP images confirms the above findings. NON-VASCULAR Hepatobiliary: No suspicious hepatic lesion. Gallbladder is unremarkable. No biliary ductal dilation. Pancreas: Within normal limits. Spleen: Within normal limits. Adrenals/Urinary Tract: Adrenal glands are unremarkable. Kidneys are normal, without renal calculi, solid enhancing lesion, or hydronephrosis. Symmetric renal enhancement. Bladder is unremarkable. Stomach/Bowel: Stomach is within normal limits. Appendix appears normal. No evidence of bowel wall thickening, distention, or inflammatory  changes. Lymphatic: No pathologically enlarged abdominal or pelvic lymph nodes. Reproductive: Prostate is unremarkable. Other: No abdominopelvic ascites.  No pneumoperitoneum. Musculoskeletal: Mild multilevel degenerative change of the spine. No acute osseous abnormality Review of the MIP images confirms the above findings. IMPRESSION: 1. 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 No involvement of the descending thoracic aorta. No pericardial effusion or mediastinal fluid. 2. No acute findings within the abdomen or pelvis. 3.  Aortic Atherosclerosis (ICD10-I70.0). Electronically Signed   By: Maudry Mayhew MD   On: 11/04/2020 20:22   CT HEAD CODE STROKE WO CONTRAST  Result Date: 11/04/2020 CLINICAL DATA:  Code stroke.  Aphasia EXAM: CT HEAD WITHOUT CONTRAST TECHNIQUE: Contiguous axial images were obtained from the base of the skull through the vertex without intravenous contrast. COMPARISON:  None. FINDINGS: Brain: There is no acute intracranial hemorrhage or mass effect. Question small subtle loss of gray-white differentiation in the right insular region. Ventricles and sulci are normal in size and configuration. Vascular: Question of hyperdensity along distal right M2 or proximal M3 MCA branch in the posterior sylvian fissure. Skull: Unremarkable. Sinuses/Orbits: No acute abnormality. Other: Mastoid air cells are clear. ASPECTS [facility] Stroke Program Early CT Score) - Ganglionic level infarction (caudate, lentiform nuclei, internal capsule, insula, M1-M3 cortex): 6 - Supraganglionic infarction (M4-M6 cortex): 3 Total score (0-10 with 10 being normal): 9 IMPRESSION: No acute intracranial hemorrhage. Question subtle small loss of gray-white differentiation in the right insular region (ASPECT score is 9). Possible  hyperdense distal right M2 or proximal M3 MCA branch in the posterior sylvian fissure. These results were communicated to Dr. Iver Nestle at 6:45 pm on 11/04/2020 by text page via the [facility] messaging system. Electronically Signed   By: Guadlupe Spanish M.D.   On: 11/04/2020 18:59   VAS [place] UPPER EXTREMITY VENOUS DUPLEX  Result Date: 11/11/2020 UPPER VENOUS STUDY  Patient Name:  BALIAN SCHALLER  Date of Exam:   11/11/2020 Medical Rec #: [id number]      Accession #:    [id number] Date of Birth: 1975/07/05     Patient Gender: M Patient Age:   36Y Exam Location:  [facility] Procedure:      VAS [place] UPPER EXTREMITY VENOUS DUPLEX Referring Phys: [id number] Steffanie Dunn --------------------------------------------------------------------------------  Indications: Septic thrombophlebitis Limitations: Patient in restraints, constant movement, unable/unwilling to reposition arm.  Comparison Study: No prior studies. Performing Technologist: Jean Rosenthal RDMS,RVT  Examination Guidelines: A complete evaluation includes B-mode imaging, spectral Doppler, color Doppler, and power Doppler as needed of all accessible portions of each vessel. Bilateral testing is considered an integral part of a complete examination. Limited examinations for reoccurring indications may be performed as noted.  Left Findings: +--------+------------+---------+-----------+----------+-------+ LEFT    CompressiblePhasicitySpontaneousPropertiesSummary +--------+------------+---------+-----------+----------+-------+ Brachial    Full                                          +--------+------------+---------+-----------+----------+-------+ Radial      Full                                          +--------+------------+---------+-----------+----------+-------+ Ulnar       Full                                          +--------+------------+---------+-----------+----------+-------+ Cephalic  Partial                                   Acute  +--------+------------+---------+-----------+----------+-------+ Basilic   Partial                Yes               Acute  +--------+------------+---------+-----------+----------+-------+  Summary:  Left: Findings consistent with acute superficial vein thrombosis involving the left basilic vein and left cephalic vein.  *See table(s) above for measurements and observations.  Diagnosing physician: Waverly Ferrari MD Electronically signed by Waverly Ferrari MD on 11/11/2020 at 5:13:28 PM.    Final    CT ANGIO HEAD NECK W WO CM (CODE STROKE)  Result Date: 11/04/2020 CLINICAL DATA:  Code stroke EXAM: CT ANGIOGRAPHY HEAD AND NECK TECHNIQUE: Multidetector CT imaging of the head and neck was performed using the standard protocol during bolus administration of intravenous contrast. Multiplanar CT image reconstructions and MIPs were obtained to evaluate the vascular anatomy. Carotid stenosis measurements (when applicable) are obtained utilizing NASCET criteria, using the distal internal carotid diameter as the denominator. CONTRAST:  74mL OMNIPAQUE IOHEXOL 350 MG/ML SOLN COMPARISON:  None. FINDINGS: CTA NECK Aortic arch: A dissection flap is present within the included ascending aorta extending into the transverse portion of the arch but not the descending portion. Dissection involves the innominate artery with thrombosis of the false lumen. This continues into the right common carotid. Left common carotid and left subclavian artery origins are patent. Right carotid system: As noted above, there is extension of dissection into the common carotid with occlusion of the false lumen resulting in narrowing of the true lumen. Minimal diameter of 1.5 mm. The external carotid origin is patent. There is minimal contrast enhancement at the ICA origin and subsequent minimal enhancement along portions of the cervical ICA. Left carotid system: Patent. Trace calcified plaque along the proximal ICA. No stenosis.  Vertebral arteries: Patent.  Right vertebral artery is dominant. Skeleton: Degenerative changes of the cervical spine. Other neck: Unremarkable. Upper chest: Included upper lungs are clear. Review of the MIP images confirms the above findings CTA HEAD Anterior circulation: Possible faint enhancement of  the intracranial right internal carotid artery. There is reconstitution at the terminus. Right M1 and proximal M2 MCA are patent. There is occlusion of a small right distal M2 and proximal M3 MCA branch corresponding to abnormality on noncontrast CT. Left middle and both anterior cerebral arteries are patent. Anterior communicating artery is present. Posterior circulation: Intracranial vertebral arteries are patent. The left vertebral artery becomes small in caliber after PICA origin. Basilar artery is patent. Major cerebellar artery origins are patent. Posterior cerebral arteries are patent there is a left posterior communicating artery. Venous sinuses: Patent as allowed by contrast bolus timing. 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 This extends into the right common carotid with narrowing of the true lumen to a minimal diameter of 1.5 mm. Minimal enhancement within the right cervical and intracranial ICA. Reconstitution at the right ICA terminus. No proximal intracranial vessel occlusion. 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 These results were called by telephone at the time of interpretation on 11/04/2020 at 6:59 pm to provider [facility] , who verbally acknowledged these results. Electronically Signed   By: Guadlupe Spanish M.D.   On: 11/04/2020 19:10     PHYSICAL EXAM  Temp:  [97.9 F (36.6 C)-99.5 F (37.5 C)] 98.8  F (37.1 C) (07/20 1600) Pulse Rate:  [68-85] 68 (07/20 1300) Resp:  [16-25] 18 (07/20 1300) BP: (110-176)/(74-108) 110/75 (07/20 1300) SpO2:  [97 %-100 %] 100 % (07/20 1300) Weight:  [62.3 kg] 62.3 kg (07/20 0500)  General -frail malnourished looking middle-aged Caucasian male,  Ophthalmologic - fundi not visualized due to noncooperation.  Cardiovascular - Regular rate and rhythm.  Neuro - drowsy.  Keeps  eyes closed but opens to stimulation not following few midline commands and with prompts on the right side.  With forced eye opening, eyes iri ght gaze deviation and unable to look to the left., not blinking to visual threat, doll's eyes present, not tracking, PERRL. Corneal reflex weakly present bilaterally, gag and cough absent.  .  Facial asymmetry with left lower facial weakness.  Tongue protrusion in midline. On pain stimulation, moving all extremities but right much greater than left. DTR diminished and no babinski. Sensation, coordination and gait not tested.     ASSESSMENT/PLAN Mr. Erving Sassano is a 45 y.o. male with history of anxiety, now seeing doctor for 22 years admitted for confusion, headache, blurry vision and aphasia. No tPA given due to outside window and aortic dissection.    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 No mass occupying hemorrhagic transformation.  Cytotoxic edema and mass effect is mildly progressed with approximately 4 mm of leftward midline shift at the foramen of [place] (previously 3 mm when remeasured). MRI large right MCA infarct with mild cytotoxic  edema and rightward midline shift.  Tiny punctate bilateral cerebral and cerebellar embolic infarcts MRA head and neck interval revascularization of the right ICA and MCA and right aorto to subclavian bypass with patent right carotid artery 2D Echo EF 55 to 60% EEG 11/08/2020  focal right hemispheric slowing and mild generalized slowing but no definite epileptiform activity. LDL 49 HgbA1c 5.2 Heparin subq for VTE prophylaxis No antithrombotic prior to admission, now on IV heparin drip for acute thrombosis of left busily can cephalic vein Ongoing aggressive stroke risk factor management Therapy recommendations: CLR  disposition: Pending  Aortic dissection CTA head and neck showed type A aortic dissection, not involving descending aorta CTS on board Status post repair MRA neck see above Respiratory failure Intubated on vent CCM on board On sedation with intermittent Versed IV  Dysphagia Currently n.p.o. Speech on board Core track placed On tube feeding  Acute blood loss anemia Hb 7.1-7.5-8.5-9.5-10.2->8.7-> 7.8 Status post PRBC during procedure Close monitoring On ASA  Delirium  Multifactorial, likely related to stroke, procedure, anemia Intubated Soft wrist restrain On precedex   Other Stroke Risk Factors   Other Active Problems Hyponatremia Na [id number]  Hospital day # 7 Patient neurological exam as well as CT scan findings are not significantly changed and small petechial hemorrhage is now present.  Which in my opinion is not reason to stop anticoagulation.  Recommend resume anticoagulation with IV heparin for acute DVT involving left basilic and cephalic vein to prevent pulmonary embolism and further clinical and hemodynamic worsening.  Risk-benefit of  heparin at the present time and his clinical scenario is in favor of continuing it.  A long discussion at the bedside with the patient's father and answered questions.  Recommend resume IV heparin..  Discussed with Dr. Karie Fetch.  Stroke team will sign off.  Kindly call for questions. This patient is critically ill due to respiratory failure, aortic dissection status post surgery, large right MCA stroke, severe anemia, dysphagia and delirium and at significant risk of neurological worsening, death form recurrent stroke, hemorrhagic conversion, CHF, cardiac arrest, seizure. This patient's care requires constant monitoring of vital signs, hemodynamics, respiratory and cardiac monitoring, review of multiple databases, neurological assessment, discussion with family, other specialists and medical decision making of high complexity. I spent 30 minutes of neurocritical care time in the care of this patient. I had long discussion with wife at bedside, updated pt current condition, treatment plan and potential prognosis, and answered all the questions.  She expressed understanding and appreciation.    Delia Heady MD Stroke Neurology 11/12/2020 5:43 PM    To contact Stroke Continuity provider, please refer to [email or web address]. After hours, contact General Neurology

## 2020-11-12 NOTE — Plan of Care (Signed)

## 2020-11-13 ENCOUNTER — Other Ambulatory Visit: Payer: Self-pay

## 2020-11-13 ENCOUNTER — Inpatient Hospital Stay (HOSPITAL_COMMUNITY)
Admission: RE | Admit: 2020-11-13 | Discharge: 2020-11-14 | DRG: 300 | Disposition: A | Discharge status: Other Acute Inpatient Hospital | Payer: Medicaid Other | Source: Intra-hospital | Attending: Physical Medicine & Rehabilitation | Admitting: Physical Medicine & Rehabilitation

## 2020-11-13 ENCOUNTER — Encounter (HOSPITAL_COMMUNITY): Payer: Self-pay | Admitting: Physical Medicine & Rehabilitation

## 2020-11-13 ENCOUNTER — Inpatient Hospital Stay (HOSPITAL_COMMUNITY): Payer: Medicaid Other

## 2020-11-13 DIAGNOSIS — I7101 Dissection of thoracic aorta: Principal | ICD-10-CM | POA: Diagnosis present

## 2020-11-13 DIAGNOSIS — I82612 Acute embolism and thrombosis of superficial veins of left upper extremity: Secondary | ICD-10-CM | POA: Diagnosis present

## 2020-11-13 DIAGNOSIS — I7100 Dissection of unspecified site of aorta: Secondary | ICD-10-CM | POA: Diagnosis present

## 2020-11-13 DIAGNOSIS — F32A Depression, unspecified: Secondary | ICD-10-CM | POA: Diagnosis present

## 2020-11-13 DIAGNOSIS — R1310 Dysphagia, unspecified: Secondary | ICD-10-CM | POA: Diagnosis present

## 2020-11-13 DIAGNOSIS — R451 Restlessness and agitation: Secondary | ICD-10-CM | POA: Diagnosis present

## 2020-11-13 DIAGNOSIS — I959 Hypotension, unspecified: Secondary | ICD-10-CM | POA: Diagnosis present

## 2020-11-13 DIAGNOSIS — F17210 Nicotine dependence, cigarettes, uncomplicated: Secondary | ICD-10-CM | POA: Diagnosis present

## 2020-11-13 DIAGNOSIS — I69391 Dysphagia following cerebral infarction: Secondary | ICD-10-CM

## 2020-11-13 DIAGNOSIS — I69320 Aphasia following cerebral infarction: Secondary | ICD-10-CM | POA: Diagnosis not present

## 2020-11-13 DIAGNOSIS — F419 Anxiety disorder, unspecified: Secondary | ICD-10-CM | POA: Diagnosis present

## 2020-11-13 DIAGNOSIS — I63511 Cerebral infarction due to unspecified occlusion or stenosis of right middle cerebral artery: Secondary | ICD-10-CM

## 2020-11-13 DIAGNOSIS — R509 Fever, unspecified: Secondary | ICD-10-CM

## 2020-11-13 DIAGNOSIS — I10 Essential (primary) hypertension: Secondary | ICD-10-CM | POA: Diagnosis present

## 2020-11-13 LAB — CBC
HCT: 31.2 % — ABNORMAL LOW (ref 39.0–52.0)
Hemoglobin: 10.3 g/dL — ABNORMAL LOW (ref 13.0–17.0)
MCH: 33.1 pg (ref 26.0–34.0)
MCHC: 33 g/dL (ref 30.0–36.0)
MCV: 100.3 fL — ABNORMAL HIGH (ref 80.0–100.0)
Platelets: 189 10*3/uL (ref 150–400)
RBC: 3.11 MIL/uL — ABNORMAL LOW (ref 4.22–5.81)
RDW: 13.4 % (ref 11.5–15.5)
WBC: 5.5 10*3/uL (ref 4.0–10.5)
nRBC: 0 % (ref 0.0–0.2)

## 2020-11-13 LAB — BASIC METABOLIC PANEL
Anion gap: 9 (ref 5–15)
BUN: 21 mg/dL — ABNORMAL HIGH (ref 6–20)
CO2: 26 mmol/L (ref 22–32)
Calcium: 8.8 mg/dL — ABNORMAL LOW (ref 8.9–10.3)
Chloride: 101 mmol/L (ref 98–111)
Creatinine, Ser: 1.01 mg/dL (ref 0.61–1.24)
GFR, Estimated: >60 mL/min (ref >60)
Glucose, Bld: 136 mg/dL — ABNORMAL HIGH (ref 70–99)
Potassium: 4.3 mmol/L (ref 3.5–5.1)
Sodium: 136 mmol/L (ref 135–145)

## 2020-11-13 LAB — HEPARIN LEVEL (UNFRACTIONATED)
Heparin Unfractionated: <0.1 IU/mL — ABNORMAL LOW (ref 0.30–0.70)
Heparin Unfractionated: <0.1 IU/mL — ABNORMAL LOW (ref 0.30–0.70)
Heparin Unfractionated: <0.1 IU/mL — ABNORMAL LOW (ref 0.30–0.70)

## 2020-11-13 LAB — GLUCOSE, CAPILLARY
Glucose-Capillary: 120 mg/dL — ABNORMAL HIGH (ref 70–99)
Glucose-Capillary: 58 mg/dL — ABNORMAL LOW (ref 70–99)
Glucose-Capillary: 62 mg/dL — ABNORMAL LOW (ref 70–99)
Glucose-Capillary: 70 mg/dL (ref 70–99)
Glucose-Capillary: 108 mg/dL — ABNORMAL HIGH (ref 70–99)
Glucose-Capillary: 76 mg/dL (ref 70–99)
Glucose-Capillary: 91 mg/dL (ref 70–99)

## 2020-11-13 LAB — MAGNESIUM: Magnesium: 2.2 mg/dL (ref 1.7–2.4)

## 2020-11-13 LAB — PHOSPHORUS: Phosphorus: 4.5 mg/dL (ref 2.5–4.6)

## 2020-11-13 MED ORDER — LABETALOL HCL 100 MG PO TABS
200.0000 mg | ORAL_TABLET | Freq: Three times a day (TID) | ORAL | Status: DC
  Administered 2020-11-13: 200 mg
  Filled 2020-11-13: qty 2

## 2020-11-13 MED ORDER — MELATONIN 5 MG PO TABS: 5.0000 mg | ORAL_TABLET | Freq: Every evening | ORAL | 0 refills | Status: DC | PRN

## 2020-11-13 MED ORDER — DOCUSATE SODIUM 50 MG/5ML PO LIQD: 100.0000 mg | Freq: Two times a day (BID) | ORAL | 0 refills | Status: DC

## 2020-11-13 MED ORDER — BISACODYL 10 MG RE SUPP: 10.0000 mg | Freq: Every day | RECTAL | 0 refills | Status: DC

## 2020-11-13 MED ORDER — FOLIC ACID 1 MG PO TABS: 1.0000 mg | ORAL_TABLET | Freq: Every day | ORAL | Status: DC

## 2020-11-13 MED ORDER — POLYETHYLENE GLYCOL 3350 17 G PO PACK
17.0000 g | PACK | Freq: Every day | ORAL | Status: DC
Start: 2020-11-14 — End: 2020-11-14

## 2020-11-13 MED ORDER — CLONAZEPAM 2 MG PO TBDP: 2.0000 mg | ORAL_TABLET | Freq: Three times a day (TID) | ORAL | 0 refills | Status: DC

## 2020-11-13 MED ORDER — BISACODYL 10 MG RE SUPP: 10.0000 mg | Freq: Every day | RECTAL | Status: DC

## 2020-11-13 MED ORDER — CLONAZEPAM 0.25 MG PO TBDP
2.0000 mg | ORAL_TABLET | Freq: Three times a day (TID) | ORAL | Status: DC
  Administered 2020-11-13: 2 mg
  Filled 2020-11-13: qty 8

## 2020-11-13 MED ORDER — FREE WATER: 100.0000 mL | Freq: Three times a day (TID) | Status: DC

## 2020-11-13 MED ORDER — OLANZAPINE 10 MG IM SOLR
10.0000 mg | Freq: Once | INTRAMUSCULAR | Status: AC | PRN
  Administered 2020-11-13: 10 mg via INTRAMUSCULAR
  Filled 2020-11-13: qty 10

## 2020-11-13 MED ORDER — MELATONIN 5 MG PO TABS
5.0000 mg | ORAL_TABLET | Freq: Every evening | ORAL | Status: DC | PRN
  Administered 2020-11-13: 5 mg
  Filled 2020-11-13: qty 1

## 2020-11-13 MED ORDER — PREDNISONE 5 MG/5ML PO SOLN: 10.0000 mg | Freq: Every day | ORAL | 0 refills | Status: DC

## 2020-11-13 MED ORDER — BISACODYL 5 MG PO TBEC: 10.0000 mg | DELAYED_RELEASE_TABLET | Freq: Every day | ORAL | Status: DC

## 2020-11-13 MED ORDER — PREDNISONE 5 MG/5ML PO SOLN: 10.0000 mg | Freq: Every day | ORAL | Status: DC

## 2020-11-13 MED ORDER — THIAMINE HCL 100 MG PO TABS: 100.0000 mg | ORAL_TABLET | Freq: Every day | ORAL | Status: DC

## 2020-11-13 MED ORDER — HEPARIN (PORCINE) 25000 UT/250ML-% IV SOLN
1750.0000 [IU]/h | INTRAVENOUS | Status: DC
  Filled 2020-11-13: qty 250

## 2020-11-13 MED ORDER — PROSOURCE TF PO LIQD: 45.0000 mL | Freq: Three times a day (TID) | ORAL | Status: DC

## 2020-11-13 MED ORDER — LABETALOL HCL 200 MG PO TABS: 200.0000 mg | ORAL_TABLET | Freq: Three times a day (TID) | ORAL | Status: DC

## 2020-11-13 MED ORDER — VALPROIC ACID 250 MG/5ML PO SOLN
250.0000 mg | Freq: Once | ORAL | Status: AC
  Administered 2020-11-13: 250 mg
  Filled 2020-11-13: qty 5

## 2020-11-13 MED ORDER — BISACODYL 5 MG PO TBEC: 10.0000 mg | DELAYED_RELEASE_TABLET | Freq: Every day | ORAL | 0 refills | Status: DC

## 2020-11-13 MED ORDER — ADULT MULTIVITAMIN LIQUID CH: 15.0000 mL | Freq: Every day | ORAL | Status: DC

## 2020-11-13 MED ORDER — OSMOLITE 1.5 CAL PO LIQD
1000.0000 mL | ORAL | Status: DC
  Administered 2020-11-13: 1000 mL
  Filled 2020-11-13 (×2): qty 1000

## 2020-11-13 MED ORDER — BUPROPION HCL 75 MG PO TABS
75.0000 mg | ORAL_TABLET | Freq: Two times a day (BID) | ORAL | Status: DC
  Administered 2020-11-13: 75 mg
  Filled 2020-11-13 (×2): qty 1

## 2020-11-13 MED ORDER — LABETALOL HCL 5 MG/ML IV SOLN: 10.0000 mg | INTRAVENOUS | Status: DC | PRN

## 2020-11-13 MED ORDER — CHLORHEXIDINE GLUCONATE 0.12 % MT SOLN: 15.0000 mL | Freq: Two times a day (BID) | OROMUCOSAL | 0 refills | Status: DC

## 2020-11-13 MED ORDER — ASPIRIN 81 MG PO CHEW: 81.0000 mg | CHEWABLE_TABLET | Freq: Every day | ORAL | Status: DC

## 2020-11-13 MED ORDER — HEPARIN (PORCINE) 25000 UT/250ML-% IV SOLN: 1550.0000 [IU]/h | INTRAVENOUS | Status: DC

## 2020-11-13 MED ORDER — BUPROPION HCL 75 MG PO TABS: 75.0000 mg | ORAL_TABLET | Freq: Two times a day (BID) | ORAL | Status: DC

## 2020-11-13 MED ORDER — OSMOLITE 1.5 CAL PO LIQD: 1000.0000 mL | ORAL | 0 refills | Status: DC

## 2020-11-13 MED ORDER — AMLODIPINE BESYLATE 2.5 MG PO TABS: 2.5000 mg | ORAL_TABLET | Freq: Every day | ORAL | Status: DC

## 2020-11-13 MED ORDER — ADULT MULTIVITAMIN LIQUID CH
15.0000 mL | Freq: Every day | ORAL | Status: DC
  Filled 2020-11-13: qty 15

## 2020-11-13 MED ORDER — VALPROIC ACID 250 MG/5ML PO SOLN
250.0000 mg | Freq: Three times a day (TID) | ORAL | Status: DC
  Administered 2020-11-13: 250 mg
  Filled 2020-11-13 (×4): qty 5

## 2020-11-13 MED ORDER — QUETIAPINE FUMARATE 25 MG PO TABS: 25.0000 mg | ORAL_TABLET | Freq: Two times a day (BID) | ORAL | Status: DC

## 2020-11-13 MED ORDER — VALPROIC ACID 250 MG PO CAPS
250.0000 mg | ORAL_CAPSULE | Freq: Once | ORAL | Status: DC
  Filled 2020-11-13: qty 1

## 2020-11-13 MED ORDER — VALPROIC ACID 250 MG/5ML PO SOLN: 250.0000 mg | Freq: Three times a day (TID) | ORAL | Status: DC

## 2020-11-13 MED ORDER — DOCUSATE SODIUM 50 MG/5ML PO LIQD
100.0000 mg | Freq: Two times a day (BID) | ORAL | Status: DC
  Administered 2020-11-13: 100 mg
  Filled 2020-11-13: qty 10

## 2020-11-13 MED ORDER — PROSOURCE TF PO LIQD
45.0000 mL | Freq: Three times a day (TID) | ORAL | Status: DC
  Administered 2020-11-13: 45 mL
  Filled 2020-11-13: qty 45

## 2020-11-13 MED ORDER — ORAL CARE MOUTH RINSE: 15.0000 mL | Freq: Two times a day (BID) | OROMUCOSAL | 0 refills | Status: DC

## 2020-11-13 MED ORDER — LORAZEPAM 2 MG/ML IJ SOLN: 1.0000 mg | INTRAMUSCULAR | 0 refills | Status: DC | PRN

## 2020-11-13 MED ORDER — HYDRALAZINE HCL 20 MG/ML IJ SOLN: 10.0000 mg | Freq: Four times a day (QID) | INTRAMUSCULAR | Status: DC | PRN

## 2020-11-13 MED ORDER — QUETIAPINE FUMARATE 25 MG PO TABS
25.0000 mg | ORAL_TABLET | Freq: Two times a day (BID) | ORAL | Status: DC
  Administered 2020-11-13: 25 mg
  Filled 2020-11-13: qty 1

## 2020-11-13 MED ORDER — FREE WATER
100.0000 mL | Freq: Three times a day (TID) | Status: DC
  Administered 2020-11-13: 100 mL

## 2020-11-13 MED ORDER — PREDNISONE 10 MG PO TABS: 10.0000 mg | ORAL_TABLET | Freq: Every day | ORAL | Status: DC

## 2020-11-13 MED ORDER — POLYETHYLENE GLYCOL 3350 17 G PO PACK: 17.0000 g | PACK | Freq: Every day | ORAL | 0 refills | Status: DC

## 2020-11-13 NOTE — Progress Notes (Signed)
Patient arrived from [facility] via bed with tube feeding, heparin IV and restraints intace

## 2020-11-13 NOTE — Progress Notes (Signed)
Inpatient Rehabilitation Medication Review by a Pharmacist  A complete drug regimen review was completed for this patient to identify any potential clinically significant medication issues.  Clinically significant medication issues were identified:  no  Check AMION for pharmacist assigned to patient if future medication questions/issues arise during this admission.  Pharmacist comments:   Time spent performing this drug regimen review (minutes):  5   Jesse [name] 11/13/2020 5:43 PM

## 2020-11-13 NOTE — PMR Pre-admission (Signed)
PMR Admission Coordinator Pre-Admission Assessment   Patient: Jesse [name] is an 45 y.o., male MRN: [id number] DOB: 07/06/1975 Height: 6' 2" (188 cm) Weight: 57.9 kg   Insurance Information HMO:     PPO:      PCP:      IPA:      80/20:      OTHER: PRIMARY: Uninsured. Reviewed cost of CIR with spouse on 7/18.       Policy#:       Subscriber: CM Name:       Phone#:      Fax#: Pre-Cert#:       Employer: Benefits:  Phone #:      Name: Eff. Date:      Deduct:       Out of Pocket Max:       Life Max: CIR:      SNF: Outpatient:      Co-Pay: Home Health:       Co-Pay: DME:      Co-Pay: Providers:  SECONDARY:       Policy#:      Phone#:   Financial Counselor:       Phone#:   The "Data Collection Information Summary" for patients in Inpatient Rehabilitation Facilities with attached "Privacy Act Statement-Health Care Records" was provided and verbally reviewed with: N/A   Emergency Contact Information Contact Information       Name Relation Home Work Mobile    jina,Ranes Spouse     [phone]    Ledo,THOMAS J Father [phone]               Current Medical History  Patient Admitting Diagnosis: R MCA s/p aortic dissection  History of Present Illness: Jesse [name] is a 44-year-old right-handed male with history of anxiety/depression with longstanding tobacco alcohol use.  Presented 11/05/2020 with acute onset of aphasia and blurred vision as well as altered mental status.  Admission chemistries unremarkable except sodium 133 glucose 112 creatinine 1.32, alcohol negative, WBC 12,700.  Cranial CT scan negative for acute changes.  Question subtle small loss of gray-white differentiation in the right insular region.  CT angiogram of chest as well as head and neck showed partially imaged type of a aortic dissection involving the ascending aorta and transverse portion but not the descending aorta.  Dissection involves innominate artery with thrombosis of the false lumen.  This extended into the  right common carotid with narrowing of the true lumen to a minimal diameter of 1.5 mm.  No proximal intracranial vessel occlusion.  There was noted occlusion of small distal right M2 and proximal M3 MCA branch.  Echocardiogram with ejection fraction of 55 to 60%.  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 Owen.  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  Mild mass-effect including trace leftward midline shift and again repeated 11/12/2020 for comparison showing interval development of petechial hemorrhage associated with evolving posterior right MCA and MCA/PCA watershed territory infarct no mass occupying hemorrhagic transformation. There was a superficial vein thrombosis left arm noted and intravenous heparin was initiated 11/12/2020 as well as remaining on low-dose aspirin.  EEG completed showing no seizure activity.  Hospital course patient did require intubation followed by pulmonary services.  Patient currently remains n.p.o. with alternative means of nutritional support.  Hospital course complicated by severe agitation delirium   and impulsiveness placed on  Seroquel and valproic acid.  Therapy evaluations completed due to patient decreased functional ability altered mental status was recommended for a comprehensive rehab program.   Complete NIHSS TOTAL: 11   Patient's medical record from [facility] has been reviewed by the rehabilitation admission coordinator and physician.   Past Medical History      Past Medical History:  Diagnosis Date   Anxiety     Depression     Essential hypertension     S/P aortic dissection repair 11/05/2020    Straight graft replacement of ascending aorta and proximal transverse aortic arch with re-suspension of native  aortic valve and open hemi arch distal anastomosis with aorta to right common carotid bypass and aorta to right subclavian bypass      Family History   family history is not on file.   Prior Rehab/Hospitalizations Has the patient had prior rehab or hospitalizations prior to admission? No   Has the patient had major surgery during 100 days prior to admission? Yes              Current Medications   Current Facility-Administered Medications:   0.9 %  sodium chloride infusion, 250 mL, Intravenous, Continuous, Ogan, Okoronkwo U, MD, Stopped at 11/08/20 0850   amLODipine (NORVASC) tablet 2.5 mg, 2.5 mg, Per Tube, Daily, Bitonti, Michael T, RPH, 2.5 mg at 11/13/20 1021   aspirin chewable tablet 81 mg, 81 mg, Per Tube, Daily, Clark, Laura P, DO, 81 mg at 11/13/20 1021   bisacodyl (DULCOLAX) EC tablet 10 mg, 10 mg, Oral, Daily, 10 mg at 11/12/20 0956 **OR** bisacodyl (DULCOLAX) suppository 10 mg, 10 mg, Rectal, Daily, Owen, Clarence H, MD, 10 mg at 11/06/20 1058   buPROPion (WELLBUTRIN) tablet 75 mg, 75 mg, Per Tube, BID, Wilson, Frank R, RPH, 75 mg at 11/13/20 1022   chlorhexidine (PERIDEX) 0.12 % solution 15 mL, 15 mL, Mouth Rinse, BID, Owen, Clarence H, MD, 15 mL at 11/13/20 1022   Chlorhexidine Gluconate Cloth 2 % PADS 6 each, 6 each, Topical, Daily, Owen, Clarence H, MD, 6 each at 11/12/20 0957   clonazePAM (KLONOPIN) disintegrating tablet 2 mg, 2 mg, Per Tube, Q8H, Clark, Laura P, DO, 2 mg at 11/13/20 0539   docusate (COLACE) 50 MG/5ML liquid 100 mg, 100 mg, Per Tube, BID, Clark, Laura P, DO, 100 mg at 11/12/20 0955   feeding supplement (OSMOLITE 1.5 CAL) liquid 1,000 mL, 1,000 mL, Per Tube, Continuous, Clark, Laura P, DO, Last Rate: 60 mL/hr at 11/12/20 1059, 1,000 mL at 11/12/20 1059   feeding supplement (PROSource TF) liquid 45 mL, 45 mL, Per Tube, TID, Smith, Daniel C, MD, 45 mL at 11/13/20 1020   fentaNYL (SUBLIMAZE) bolus via infusion 50-100 mcg, 50-100 mcg, Intravenous, Q15 min PRN,  Hoffman, Paul W, NP   folic acid (FOLVITE) tablet 1 mg, 1 mg, Per Tube, Daily, Clark, Laura P, DO, 1 mg at 11/13/20 1022   free water 100 mL, 100 mL, Per Tube, Q8H, Agarwala, Ravi, MD, 100 mL at 11/13/20 0550   heparin ADULT infusion 100 units/mL (25000 units/250mL), 1,550 Units/hr, Intravenous, Continuous, Clark, Laura P, DO, Last Rate: 14 mL/hr at 11/13/20 0900, 1,400 Units/hr at 11/13/20 0900   hydrALAZINE (APRESOLINE) injection 10 mg, 10 mg, Intravenous, Q6H PRN, Roddenberry, Myron G, PA-C, 10 mg at 11/12/20 0432   labetalol (NORMODYNE) injection 10 mg, 10 mg, Intravenous, Q1H PRN, Agarwala, Ravi, MD, 10 mg at 11/12/20 0714   labetalol (NORMODYNE) tablet 200 mg,   200 mg, Per Tube, TID, Clark, Laura P, DO, 200 mg at 11/13/20 1021   LORazepam (ATIVAN) injection 1-2 mg, 1-2 mg, Intravenous, Q2H PRN, Sommer, Steven E, MD, 2 mg at 11/13/20 0604   MEDLINE mouth rinse, 15 mL, Mouth Rinse, BID, Agarwala, Ravi, MD, 15 mL at 11/13/20 1023   melatonin tablet 5 mg, 5 mg, Per Tube, QHS PRN, Sommer, Steven E, MD, 5 mg at 11/12/20 2258   multivitamin liquid 15 mL, 15 mL, Per Tube, Daily, Clark, Laura P, DO, 15 mL at 11/13/20 1021   polyethylene glycol (MIRALAX / GLYCOLAX) packet 17 g, 17 g, Per Tube, Daily, Clark, Laura P, DO, 17 g at 11/12/20 0955   [START ON 11/14/2020] predniSONE 5 MG/5ML solution 10 mg, 10 mg, Per Tube, Q breakfast, Clark, Laura P, DO   QUEtiapine (SEROQUEL) tablet 25 mg, 25 mg, Per Tube, BID, Clark, Laura P, DO, 25 mg at 11/13/20 1022   sodium chloride flush (NS) 0.9 % injection 3 mL, 3 mL, Intravenous, PRN, Owen, Clarence H, MD, 3 mL at 11/10/20 2058   thiamine tablet 100 mg, 100 mg, Per Tube, Daily, Clark, Laura P, DO, 100 mg at 11/13/20 1021   valproic acid (DEPAKENE) 250 MG/5ML solution 250 mg, 250 mg, Per Tube, TID, Agarwala, Ravi, MD, 250 mg at 11/13/20 1021   Patients Current Diet:  Diet Order                  Diet NPO time specified  Diet effective now                          Precautions / Restrictions Precautions Precautions: Fall, Other (comment), Sternal Precaution Comments: L inattention, cortrak Restrictions Weight Bearing Restrictions: Yes RUE Weight Bearing:  (sternal precautions) LUE Weight Bearing:  (sternal precautions) Other Position/Activity Restrictions: sternal    Has the patient had 2 or more falls or a fall with injury in the past year? No   Prior Activity Level Community (5-7x/wk): active prior to admit, no DME used, driving, has 3 children   Prior Functional Level Self Care: Did the patient need help bathing, dressing, using the toilet or eating? Independent   Indoor Mobility: Did the patient need assistance with walking from room to room (with or without device)? Independent   Stairs: Did the patient need assistance with internal or external stairs (with or without device)? Independent   Functional Cognition: Did the patient need help planning regular tasks such as shopping or remembering to take medications? Independent   Home Assistive Devices / Equipment Home Assistive Devices/Equipment: None   Prior Device Use: Indicate devices/aids used by the patient prior to current illness, exacerbation or injury? None of the above   Current Functional Level Cognition   Overall Cognitive Status: Difficult to assess Difficult to assess due to: Impaired communication Current Attention Level: Focused Orientation Level: Oriented to person, Disoriented to place, Disoriented to time, Disoriented to situation Following Commands: Follows one step commands inconsistently, Follows one step commands with increased time General Comments: following only a few simple commands, poor awareness. limited verbalizations during session    Extremity Assessment (includes Sensation/Coordination)   Upper Extremity Assessment: RUE deficits/detail, LUE deficits/detail, Difficult to assess due to impaired cognition RUE Deficits / Details: able to hold R UE  up against gravity, but difficult to assess due to cognition LUE Deficits / Details: able to squeeze L hand when asking to "hold my hand", does not withdrawl to   painful stimuli, L inattention LUE Sensation: decreased light touch, decreased proprioception LUE Coordination: decreased fine motor, decreased gross motor  Lower Extremity Assessment: Defer to PT evaluation RLE Deficits / Details: pt raises both LEs against gravity at bed level, stands without note of knee buckling at edge of bed, minG for standing balance after PT initiation LLE Deficits / Details: pt raises both LEs against gravity at bed level, stands without note of knee buckling at edge of bed, minG for standing balance after PT initiation     ADLs   Overall ADL's : Needs assistance/impaired Eating/Feeding: NPO Grooming: Total assistance, Standing, Sitting, Wash/dry face, Brushing hair Grooming Details (indicate cue type and reason): EOB R hand with hand over hand support total assist Lower Body Dressing: Total assistance, +2 for safety/equipment, +2 for physical assistance, Sit to/from stand Functional mobility during ADLs: Maximal assistance, +2 for physical assistance, +2 for safety/equipment, Cueing for safety, Cueing for sequencing General ADL Comments: total assist for all self care at this time     Mobility   Overal bed mobility: Needs Assistance Bed Mobility: Supine to Sit, Sit to Supine Supine to sit: +2 for physical assistance, +2 for safety/equipment, Total assist Sit to supine: Max assist, +2 for safety/equipment, +2 for physical assistance General bed mobility comments: total assist +2 to EOB due to lethargy, max assist to return supine     Transfers   Overall transfer level: Needs assistance Equipment used: 2 person hand held assist Transfers: Sit to/from Stand Sit to Stand: Mod assist, +2 safety/equipment Stand pivot transfers: Mod assist, +2 safety/equipment General transfer comment: Mod assist to initiate  and then +2 min assist to complete stand, support to ensure not pushing with UEs     Ambulation / Gait / Stairs / Wheelchair Mobility   Ambulation/Gait Ambulation/Gait assistance: Mod assist, +2 physical assistance Gait Distance (Feet): 3 Feet (forward and back) Assistive device: 1 person hand held assist, 2 person hand held assist Gait Pattern/deviations: Step-to pattern, Decreased step length - right, Decreased step length - left, Decreased stride length General Gait Details: uncoordinated, staggered steps with scissoring and overall instability Gait velocity interpretation: <1.31 ft/sec, indicative of household ambulator     Posture / Balance Dynamic Sitting Balance Sitting balance - Comments: heavy foward lean at times, brief bouts of sitting upright but unable to sustain Balance Overall balance assessment: Needs assistance Sitting-balance support: No upper extremity supported, Feet supported Sitting balance-Leahy Scale: Poor Sitting balance - Comments: heavy foward lean at times, brief bouts of sitting upright but unable to sustain Postural control: Other (comment) (foward lean) Standing balance support: Bilateral upper extremity supported, During functional activity Standing balance-Leahy Scale: Poor Standing balance comment: +2 assist for static standing     Special needs/care consideration Behavioral consideration agitation/resltess, and Designated visitor wife, Jina, to stay the night for agitation/safety    Previous Home Environment (from acute therapy documentation) Living Arrangements: Spouse/significant other, Children Available Help at Discharge: Family, Available 24 hours/day Type of Home: House Home Layout: Able to live on main level with bedroom/bathroom Home Access: Stairs to enter Entrance Stairs-Rails: None (can add one if needed) Entrance Stairs-Number of Steps: 2 Bathroom Shower/Tub: Tub/shower unit Bathroom Toilet: Standard Bathroom Accessibility: Yes How  Accessible: Accessible via walker Home Care Services: No Additional Comments: pt with aphasia and still on some sedation, PT/OT unable to obtain history   Discharge Living Setting Plans for Discharge Living Setting: Patient's home, Lives with (comment) (spouse and 3 children) Type of Home at Discharge:   House Discharge Home Layout: Able to live on main level with bedroom/bathroom Discharge Home Access: Stairs to enter Entrance Stairs-Rails: None Entrance Stairs-Number of Steps: 2 Discharge Bathroom Shower/Tub: Tub/shower unit Discharge Bathroom Toilet: Standard Discharge Bathroom Accessibility: Yes How Accessible: Accessible via walker Does the patient have any problems obtaining your medications?: Yes (Describe) (uninsured)   Social/Family/Support Systems Patient Roles: Spouse, Parent Contact Information: children ages 18, 13, and 10 Anticipated Caregiver: spouse, Jina Anticipated Caregiver's Contact Information: [phone] Ability/Limitations of Caregiver: n/a Caregiver Availability: 24/7 Discharge Plan Discussed with Primary Caregiver: Yes Is Caregiver In Agreement with Plan?: Yes Does Caregiver/Family have Issues with Lodging/Transportation while Pt is in Rehab?: No   Goals Patient/Family Goal for Rehab: PT/OT min assist, SLP, min/mod assist Expected length of stay: 24-28 days Additional Information: Pt has been agitated/restless on acute, requiring BUE restraints and mittens. Pt/Family Agrees to Admission and willing to participate: Yes Program Orientation Provided & Reviewed with Pt/Caregiver Including Roles  & Responsibilities: Yes Additional Information Needs: Plan to bring to CIR to address behavior and begin to mobilize more  Barriers to Discharge: Insurance for SNF coverage, Home environment access/layout   Decrease burden of Care through IP rehab admission: n/a   Possible need for SNF placement upon discharge: Not anticipated. Reviewed need to d/c home with family  support given lack of payor source.    Patient Condition: I have reviewed medical records from [facility], spoken with CM, and patient and spouse. I met with patient at the bedside and discussed via phone for inpatient rehabilitation assessment.  Patient will benefit from ongoing PT, OT, and SLP, can actively participate in 3 hours of therapy a day 5 days of the week, and can make measurable gains during the admission.  Patient will also benefit from the coordinated team approach during an Inpatient Acute Rehabilitation admission.  The patient will receive intensive therapy as well as Rehabilitation physician, nursing, social worker, and care management interventions.  Due to bladder management, bowel management, safety, skin/wound care, disease management, medication administration, pain management, and patient education the patient requires 24 hour a day rehabilitation nursing.  The patient is currently mod to max +2 with mobility and basic ADLs.  Discharge setting and therapy post discharge at home with home health is anticipated.  Patient has agreed to participate in the Acute Inpatient Rehabilitation Program and will admit today.   Preadmission Screen Completed By:  Caitlin E Warren, PT, DPT 11/13/2020 11:08 AM ______________________________________________________________________   Discussed status with Dr. Cayle Cordoba on 11/13/20  at 11:14 AM  and received approval for admission today.   Admission Coordinator:  Caitlin E Warren, PT, DPT time 11:14 AM /Date  11/13/20     Assessment/Plan: Diagnosis: Does the need for close, 24 hr/day Medical supervision in concert with the patient's rehab needs make it unreasonable for this patient to be served in a less intensive setting? 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 Yes Does the patient require coordinated care of a physician, rehab nurse, PT, OT, and SLP to address physical and functional deficits in the context of the above medical diagnosis(es)? Yes Addressing deficits in the following areas: balance, endurance, locomotion, strength, transferring, bowel/bladder control, bathing, dressing, feeding, grooming, toileting, cognition, speech, language, and swallowing Can the patient actively participate in   an intensive therapy program of at least 3 hrs of therapy 5 days a week? Yes The potential for patient to make measurable gains while on inpatient rehab is good and fair Anticipated functional outcomes upon discharge from inpatient rehab: min assist PT, min assist OT, min assist and mod assist SLP Estimated rehab length of stay to reach the above functional goals is: 24-28 days Anticipated discharge destination: Home 10. Overall Rehab/Functional Prognosis: good and fair     MD Signature: 

## 2020-11-13 NOTE — Progress Notes (Signed)
Transferred to CIR 4M03 via bed. Report given to his nurse Ed, RN.

## 2020-11-13 NOTE — Discharge Summary (Signed)
Physician Discharge Summary  Patient ID: Jesse [name] MRN: [id number] DOB/AGE: 07-24-1975 45 y.o.  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)   Acute hypoxemic respiratory failure (HCC)   Elevated troponin   New onset left bundle branch block (LBBB)   Protein-calorie malnutrition, severe   Encephalopathy acute   Phlebitis   Polysubstance abuse (HCC)   Atelectasis Agitated deliurium CVA of MCA SVT of LUE  with thrombophlenitis Chronic anemia Dysphagia Deconditioning History of tobacco abuse History of alcohol abusehypoglycemia   Discharged Condition: fair  HPI: Patient is a 45 year old male who presented to the emergency department with acute onset aphasia and blurry vision and has been referred for emergent surgical consultation due to discovery of acute type A aortic dissection.  Patient has history of anxiety and depression with longstanding tobacco and alcohol use but no other significant chronic medical problems, although the patient has not seen a physician for routine checkup in many years.  He was reportedly in his usual state of health until approximately 12 noon today when he was out mowing the lawn and he developed sudden onset chest pain radiating to his neck.  The pain seemed to subside but over the next several hours it became apparent the patient had developed severe acute aphasia as well as blurry vision in his right eye.  The patient's wife found him laying in the attic confused and called EMS.  The patient was noted to have severe receptive and expressive aphasia.  Code stroke was activated and the patient was brought to the emergency department  at [facility] where initial CT of the head revealed no acute intracranial hemorrhage and only subtle findings but CT angiogram revealed acute dissection involving the aortic arch extending through the innominate artery into the right common carotid artery.  Emergent cardiothoracic surgical consultation was requested.  Upon arrival to the emergency department the patient was in the CT scanner and promptly transported back to the emergency department where his wife was at the bedside.  The patient is awake and alert and able to follow some simple commands although confused.  He answers appropriately to his name but he has difficulty finding words and cannot speak coherently.  He moves all 4 extremities without obvious weakness.  He denies ongoing chest pain but reports some mild headache.  He denies shortness of breath.  Patient is married and lives locally in [place] with his wife.  He is currently out of work.  The patient reports that he smokes on a daily basis and has been drinking more recently since he has been out of a job.  He has not had any significant chronic medical problems.  He does take Xanax on a regular basis but no other prescription medications.  Hospital Course: Jesse [name] was taken to the operating room emergently where the aortic dissection was repaired utilizing hypothermic circulatory arrest.  He was coagulopathic at the conclusion of the operation and was transfused with platelets, cryoprecipitate, and fresh frozen plasma.  Postoperative bleeding tapered off allowing chest closure and Jesse [name] was transported to the  CVICU in stable condition. He remained in a stable sinus rhythm left bundle branch block.  He was weaned from the ventilator  and extubated by midmorning on the day of surgery he was noted to be confused and agitated.  He moved all 4 extremities not follow commands. Given his presentation with aphasia and blurry vision, we consulted neurology suspicion  of acute CVA secondary to aortic dissection.  CT scan of the head was obtained that showed a large area of acute infarct in the territory of the middle cerebral artery and posterior cerebral arteries.   On 7/15, pt had severe agitated delirium and worsening oxygen requirements, requiring heated high flow nasal cannula and a non-rebreather simultaneously. He was unable to sustain adequate oxygen saturation and he was intubated and sedated. On 7/16, patient was extubated to high flow nasal cannula.   On 7/19, pt found to have SVT in L antecubital fossa. Was started on keflex, vancomycin, and a heparin drip. ID was consulted. Blood cultures showed no growth, soft tissue ultrasound showed no abscess, so antibiotics were discontinued per ID recs. On 7/20 a rapid response was called for anisicoria. Head CT showed mild petechial hemorrhage with 1 extra mm of shift. No herniation. Neuro exam remained unchanged. Neurology was consulted and no change in management resulted.   Throughout admission in the ICU, pt has had persistent hyperactive delirium. Was initially on precedex, but was weaned and stopped on 7/18. Agitation managed with scheduled klonopin, seroquel, and valproic acid. Ativan prn. Pt was evaluated by physical therapy, Occupational Therapy, and speech language pathology. Primary medical team and physical therapy are in agreement that patient will be discharged to CIR for further rehabilitation as his mobility is declining and a more scheduled rehabilitation routine may improve his delirium.   Consults: pulmonary/intensive care, ID, and neurology  Treatments:  #Agitated Delirium, c/f alcohol withdrawal and ICU delirium -Continue valproic acid 250mg  TID -Continue clonazepam 2mg  q8h -Continue seroquel 25mg  BID, Qtc normalized -Ativan prn -Requires restraints for impulsive, non-redirectable behavior and agitation -Safety precautions 1:1  #Superficial Venous Thrombosis -Continue heparin drip.  Ok'ed by neurology in setting of mild intracranial hemorrhage -Continue to follow blood cultures  #Aortic Dissection, type A with extension into R carotid artery s/p graft repair #Acute right MCA stroke  #HTN, likely chronic -Post-operative care managed by cardiology -Daily aspirin, statin -Continue labetalol 200mg  TID -Continue amlodipine 2.5mg  daily  -Goal SBP< 160, prn hydralazine and labetalol IV  #History of anxiety, unspecified  #History of tobacco use disorder #History of alcohol use -Continue Wellbutrin 75 mg BID -Recommend cessation -Supplemental vitamins  #Deconditioning #Dysphagia #Severe protein energy malnutrition, c/f refeeding -Continue tube feeds via Cortrak -Thiamine, folate, MVI -PT/OT/SLP  #Hypoglycemia: Likely insulin-sensitive given low BMI -Continue DW10 at 56ml/hr -Continue frequent glucose monitoring   #Anemia, 2/2 post-op blood loss #Mild thrombocytopenia -Continue to monitor -Transfuse for hemoglobin <7 or hemodynamically significant bleeding -Transfuse for platelets <10 unless bleeding  Discharge Exam: Blood pressure (!) 152/78, pulse 81, temperature (!) 97.5 F (36.4 C), temperature source Oral, resp. rate (!) 23, height 6\' 2"  (1.88 m), weight 57.9 kg, SpO2 98 %. General appearance: frail, chronically ill appearing man, lying in bed. In NAD. Sedated. Head: Normocephalic, without obvious abnormality, atraumatic, temporal wasting, NG tube in place. Eyes: PERRL, Pupils 33mm, equal in size bilaterally Resp: CTAB on anterior exam, breathing comfortably on room air Cardio: regular rate and rhythm, S1, S2 normal, no murmur, click, rub or gallop GI: soft, non-tender; bowel sounds normal; no masses,  no organomegaly Extremities: No edema Skin: Chest incision clean, dry, and intact. Firm, erythematous nodule on glute Neurologic: Mental status: Responsive  to voice. Follows commands. Spontaneously moves all four extremities.   Disposition:  CIR   Allergies as of 11/13/2020   No Known Allergies      Medication List     STOP taking these medications    aspirin-acetaminophen-caffeine 250-250-65 MG tablet Commonly known as: EXCEDRIN MIGRAINE   diphenhydrAMINE 25 MG tablet Commonly known as: BENADRYL   Potassium 95 MG Tabs       TAKE these medications    amLODipine 2.5 MG tablet Commonly known as: NORVASC Place 1 tablet (2.5 mg total) into feeding tube daily. Start taking on: November 14, 2020   aspirin 81 MG chewable tablet Place 1 tablet (81 mg total) into feeding tube daily. Start taking on: November 14, 2020   bisacodyl 5 MG EC tablet Commonly known as: DULCOLAX Take 2 tablets (10 mg total) by mouth daily. Start taking on: November 14, 2020   bisacodyl 10 MG suppository Commonly known as: DULCOLAX Place 1 suppository (10 mg total) rectally daily. Start taking on: November 14, 2020   buPROPion 75 MG tablet Commonly known as: WELLBUTRIN Place 1 tablet (75 mg total) into feeding tube 2 (two) times daily.   chlorhexidine 0.12 % solution Commonly known as: PERIDEX 15 mLs by Mouth Rinse route 2 (two) times daily.   clonazepam 2 MG disintegrating tablet Commonly known as: KLONOPIN Place 1 tablet (2 mg total) into feeding tube every 8 (eight) hours.   docusate 50 MG/5ML liquid Commonly known as: COLACE Place 10 mLs (100 mg total) into feeding tube 2 (two) times daily.   feeding supplement (OSMOLITE 1.5 CAL) Liqd Place 1,000 mLs into feeding tube continuous.   feeding supplement (PROSource TF) liquid Place 45 mLs into feeding tube 3 (three) times daily.   folic acid 1 MG tablet Commonly known as: FOLVITE Place 1 tablet (1 mg total) into feeding tube daily. Start taking on: November 14, 2020   free water Soln Place 100 mLs into feeding tube every 8 (eight) hours.   heparin [id number] UT/250ML infusion Inject 1,550 Units/hr into the vein continuous.   hydrALAZINE 20 MG/ML injection Commonly known as:  APRESOLINE Inject 0.5 mLs (10 mg total) into the vein every 6 (six) hours as needed.   labetalol 5 MG/ML injection Commonly known as: NORMODYNE Inject 2 mLs (10 mg total) into the vein every hour as needed (for MAP > 95).   labetalol 200 MG tablet Commonly known as: NORMODYNE Place 1 tablet (200 mg total) into feeding tube 3 (three) times daily.   LORazepam 2 MG/ML injection Commonly known as: ATIVAN Inject 0.5-1 mLs (1-2 mg total) into the vein every 2 (two) hours as needed for sedation.   melatonin 5 MG Tabs Place 1 tablet (5 mg total) into feeding tube at bedtime as needed. What changed:  medication strength how much to take how to take this reasons to take this   mouth rinse Liqd solution 15 mLs by Mouth Rinse route 2 (two) times daily.   multivitamin Liqd Place 15 mLs into feeding tube daily. Start taking on: November 14, 2020   polyethylene glycol 17 g packet Commonly known as: MIRALAX / GLYCOLAX Place 17 g into feeding tube daily. Start taking on: November 14, 2020   predniSONE 5 MG/5ML solution Place 10 mLs (10 mg total) into feeding tube daily with breakfast. Start taking on: November 14, 2020   QUEtiapine 25 MG tablet Commonly known as: SEROQUEL Place 1 tablet (25 mg total) into feeding tube 2 (two) times daily.  thiamine 100 MG tablet Place 1 tablet (100 mg total) into feeding tube daily. Start taking on: November 14, 2020   valproic acid 250 MG/5ML solution Commonly known as: DEPAKENE Place 5 mLs (250 mg total) into feeding tube 3 (three) times daily.         Signed: Rolene Arbour, MS4 11/13/2020, 10:21 AM      Attending attestation:  I have reviewed this note and agree with the documentation as above.  BP 130/80   Pulse 71   Temp 98.9 F (37.2 C) (Oral)   Resp 18   Ht 6\' 2"  (1.88 m)   Wt 57.9 kg   SpO2 99%   BMI 16.39 kg/m  Chronically ill appearing man lying in bed in NAD [place]/AT, eyes anicteric, cortrak Breathing comfortably on RA,  CTA S1S2, RRR Abd soft, NT No LE edema Skin warm, dry Sleeping but easily arousable   Admitted for aortic dissection complicated by MCA stroke from extension of dissection into carotic artery. Post-op had complicated course with severe delirium requiring precedex and benzos, dysphagia requiring cortrak and supplemental nutrition. He requires significant rehabilitation to recover to his previous functional status. He has been started on a heparin infusion for extensive SVT in his left arm that is at increased risk of extension with significant immobility. He will be discharged to CIR. Med reconciliation completed as above.  , DO 11/13/20 2:07 PM [facility]

## 2020-11-13 NOTE — Progress Notes (Signed)
Physical Therapy Treatment Patient Details Name: Jesse [name] MRN: [id number] DOB: Apr 19, 1976 Today's Date: 11/13/2020    History of Present Illness Pt is a 45 y.o. male admitted 11/04/20 with acute onset aphasia, blurry vision. CTA revealed acute aortic dissection.  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 Pt with worsening o2 requirements requiring intubation 7/15, self extubated 7/16. CTHead on 7/20 for unequal pupils revealed interval development of petechial hemorrhage and mild progression of edema and mass effect with 26mm leftward shift. PMH: Anxiety, Depression, Essential hypertension.    PT Comments    Pt's cognitive status unchanged from previous session, with focused attention for <67min. Pt intermittently responds to commands, even when multimodal cues are provided, and displays decreased safety awareness and awareness of precautions during session. Pt demonstrates increased ROM on L side than previously noted and improvements with static standing balance. Overall, pt limited by cognition. Recommend continuation of PT to improve static standing balance and progress to ambulation.  Follow Up Recommendations  CIR     Equipment Recommendations  Other (comment);Wheelchair (measurements PT);Wheelchair cushion (measurements PT);Hospital bed    Recommendations for Other Services       Precautions / Restrictions Precautions Precautions: Fall;Other (comment);Sternal Precaution Booklet Issued: No Precaution Comments: L inattention, cortrak    Mobility  Bed Mobility Overal bed mobility: Needs Assistance Bed Mobility: Supine to Sit       Sit to supine: Max assist;HOB elevated   General bed mobility comments: max A foot egress, function of the bed utilize.    Transfers   Equipment used: 2 person hand held assist Transfers: Sit to/from Stand Sit to Stand: Mod assist;+2  physical assistance         General transfer comment: Pt performed sit to stand transfer x2 (1st transfer for 2 mins, 2nd transfer ~19min). mod +2 to initiate, power up into standing, and provide bilateral knee block, as pt would cross legs in seated position. Multimodal cues provided.  Ambulation/Gait                 Stairs             Wheelchair Mobility    Modified Rankin (Stroke Patients Only)       Balance Overall balance assessment: Needs assistance       Postural control: Left lateral lean   Standing balance-Leahy Scale: Poor Standing balance comment: +2 for static standing                            Cognition Arousal/Alertness: Awake/alert Behavior During Therapy: Impulsive;Restless Overall Cognitive Status: Impaired/Different from baseline Area of Impairment: Attention;Following commands;Safety/judgement;Awareness                   Current Attention Level: Focused     Safety/Judgement: Decreased awareness of safety;Decreased awareness of deficits Awareness: Intellectual Problem Solving: Slow processing;Decreased initiation;Difficulty sequencing;Requires verbal cues;Requires tactile cues General Comments: pt initially lethargic but able to arouse with very limited attention throughout. Pt unable to respond to orientation questions or follow verbal commands. Pt able to assist with standing with max multimodal cues and increased time, left inattention      Exercises      General Comments        Pertinent Vitals/Pain Pain Assessment: No/denies pain    Home Living   Living Arrangements: Spouse/significant other;Children Available Help at Discharge: Family;Available 24 hours/day Type  of Home: House Home Access: Stairs to enter Entrance Stairs-Rails: None (can add one if needed) Home Layout: Able to live on main level with bedroom/bathroom        Prior Function Level of Independence: Independent          PT Goals  (current goals can now be found in the care plan section) Progress towards PT goals: Progressing toward goals    Frequency    Min 4X/week      PT Plan Current plan remains appropriate    Co-evaluation              AM-PAC PT "6 Clicks" Mobility   Outcome Measure  Help needed turning from your back to your side while in a flat bed without using bedrails?: A Lot Help needed moving from lying on your back to sitting on the side of a flat bed without using bedrails?: A Lot Help needed moving to and from a bed to a chair (including a wheelchair)?: Total Help needed standing up from a chair using your arms (e.g., wheelchair or bedside chair)?: A Lot Help needed to walk in hospital room?: Total Help needed climbing 3-5 steps with a railing? : Total 6 Click Score: 9    End of Session Equipment Utilized During Treatment: Gait belt Activity Tolerance: Patient tolerated treatment well Patient left: in bed;with call bell/phone within reach;with bed alarm set;with restraints reapplied Nurse Communication: Mobility status PT Visit Diagnosis: Unsteadiness on feet (R26.81);Other abnormalities of gait and mobility (R26.89);Muscle weakness (generalized) (M62.81);Other symptoms and signs involving the nervous system (R29.898)     Time: 8676-7209 PT Time Calculation (min) (ACUTE ONLY): 27 min  Charges:  $Therapeutic Activity: 23-37 mins                     Velda Shell, SPT Acute Rehab: [phone]    Vance Gather 11/13/2020, 1:50 PM

## 2020-11-13 NOTE — H&P (Signed)
Physical Medicine and Rehabilitation Admission H&P        Chief Complaint  Patient presents with   Code Stroke  : HPI: Jesse [name] is a 45 year old right-handed male with history of anxiety/depression with longstanding tobacco alcohol use.  Per chart review patient lives with spouse.  Reportedly independent prior to admission.  Presented 11/05/2020 with acute onset of aphasia and blurred vision as well as altered mental status.  Admission chemistries unremarkable except sodium 133 glucose 112 creatinine 1.32, alcohol negative, WBC 12,700.  Cranial CT scan negative for acute changes.  Question subtle small loss of gray-white differentiation in the right insular region.  CT angiogram of chest as well as head and neck showed partially imaged type of a aortic dissection involving the ascending aorta and transverse portion but not the descending aorta.  Dissection involves innominate artery with thrombosis of the false lumen.  This extended into the right common carotid with narrowing of the true lumen to a minimal diameter of 1.5 mm.  No proximal intracranial vessel occlusion.  There was noted occlusion of small distal right M2 and proximal M3 MCA branch.  Echocardiogram with ejection fraction of 55 to 60%.  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 Cornelius Moras.  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  There was a superficial vein thrombosis left arm noted and intravenous  heparin was initiated 11/12/2020 as well as remaining on low-dose aspirin.  EEG completed showing no seizure activity.  Hospital course patient did require intubation followed by pulmonary services.  Patient currently remains n.p.o. with alternative means of nutritional support.  Hospital course complicated by severe agitation delirium and impulsiveness placed on  Seroquel and valproic acid.  Therapy evaluations completed due to patient decreased functional ability altered mental status was admitted for a comprehensive rehab program.   Pt woke but not able to communicate more than social niceties.      Review of Systems  Unable to perform ROS: Mental acuity  Constitutional:  Negative for chills and fever. HENT:  Negative for hearing loss.   Eyes:  Positive for blurred vision. Negative for double vision. Respiratory:  Negative for cough and shortness of breath.   Cardiovascular:  Negative for chest pain, palpitations and leg swelling. Gastrointestinal:  Positive for diarrhea. Negative for heartburn, nausea and vomiting. Genitourinary:  Negative for dysuria, flank pain and hematuria. Skin:  Negative for rash. Neurological:  Positive for speech change and headaches. Psychiatric/Behavioral:  Positive for depression.        Anxiety  All other systems reviewed and are negative.     Past Medical History:  Diagnosis Date   Anxiety     Depression     Essential hypertension     S/P aortic dissection repair 11/05/2020    Straight graft replacement of ascending aorta and proximal transverse aortic arch with re-suspension of native aortic valve and open hemi arch distal anastomosis with aorta to right common carotid bypass and aorta to right subclavian bypass         Past Surgical History:  Procedure Laterality Date   ASCENDING AORTIC  ROOT REPLACEMENT N/A 11/04/2020    Procedure: 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 WITH REPLACEMENT OF ASCENDING AORTA AND HEMIARCH USING HEMASHIELD PLATINUM  GRAFT AND HEMASHIELD GOLD 14 X GRAFT, RESUSPENSION OF NATIVE VALVE, AORTA TO RIGHT CAROTID BYPASS, AORTA TO RIGHT SUBCLAVIAN BYPASS;  Surgeon: Purcell Nails, MD;  Location: [facility] OR;  Service: Open Heart Surgery;  Laterality: N/A;   TEE WITHOUT CARDIOVERSION N/A 11/04/2020    Procedure: TRANSESOPHAGEAL ECHOCARDIOGRAM (TEE);  Surgeon: Purcell Nails, MD;  Location: [facility] OR;  Service: Open Heart Surgery;  Laterality: N/A;    History reviewed. No pertinent family history. Social History:  reports that he has been smoking cigarettes. He does not have any smokeless tobacco history on file. He reports current alcohol use. He reports previous drug use. Allergies: No Known Allergies       Medications Prior to Admission  Medication Sig Dispense Refill   aspirin-acetaminophen-caffeine (EXCEDRIN MIGRAINE) 250-250-65 MG tablet Take 2 tablets by mouth every 6 (six) hours as needed for headache.       diphenhydrAMINE (BENADRYL) 25 MG tablet Take 50-75 mg by mouth every 6 (six) hours as needed for sleep.       Melatonin 10 MG TABS Take 10 mg by mouth at bedtime as needed (sleep).       Potassium 95 MG TABS Take 95 mg by mouth daily as needed (cramps).          Drug Regimen Review Drug regimen was reviewed and remains appropriate with no significant issues identified   Home: Home Living Family/patient expects to be discharged to:: Unsure Living Arrangements: Spouse/significant other Additional Comments: pt with aphasia and still on some sedation, PT/OT unable to obtain history   Functional History: Prior Function Level of Independence:  (unsure, assume independent as pt was working in yard prior to symptom onset) Comments: unsure, assume independent (pt working in yard prior to onset)   Functional Status:  Mobility: Bed Mobility Overal bed mobility: Needs Assistance Bed Mobility: Supine to Sit, Sit to Supine Supine to sit: +2 for physical assistance, +2 for safety/equipment, Total  assist Sit to supine: Max assist, +2 for safety/equipment, +2 for physical assistance General bed mobility comments: total assist +2 to EOB due to lethargy, max assist to return supine Transfers Overall transfer level: Needs assistance Equipment used: 2 person hand held assist Transfers: Sit to/from Stand Sit to Stand: Mod assist, +2 safety/equipment Stand pivot transfers: Mod assist, +2 safety/equipment General transfer comment: Mod assist to initiate and then +2 min assist to complete stand, support to ensure not pushing with UEs Ambulation/Gait Ambulation/Gait assistance: Mod assist, +2 physical assistance Gait Distance (Feet): 3 Feet (forward and back) Assistive device: 1 person hand held assist, 2 person hand held assist Gait Pattern/deviations: Step-to pattern, Decreased step length - right, Decreased step length - left, Decreased stride length General Gait Details: uncoordinated, staggered steps with scissoring and overall instability Gait velocity interpretation: <1.31 ft/sec, indicative of household ambulator   ADL: ADL Overall ADL's : Needs assistance/impaired Eating/Feeding: NPO Grooming: Total assistance, Standing, Sitting, Wash/dry face, Brushing hair Grooming Details (indicate cue type and reason): EOB R hand with hand over hand support total assist Lower Body Dressing: Total assistance, +2 for safety/equipment, +2 for physical assistance, Sit to/from stand Functional mobility during ADLs: Maximal assistance, +2 for physical assistance, +2 for safety/equipment, Cueing for safety, Cueing for sequencing General ADL Comments: total assist for all self care at this time   Cognition: Cognition Overall Cognitive  Status: Difficult to assess Orientation Level: Oriented to person, Disoriented to place, Disoriented to time, Disoriented to situation Cognition Arousal/Alertness: Lethargic Behavior During Therapy: Impulsive, Restless, Agitated Overall Cognitive Status: Difficult  to assess Area of Impairment: Attention, Following commands, Awareness, Problem solving Current Attention Level: Focused Following Commands: Follows one step commands inconsistently, Follows one step commands with increased time Awareness: Intellectual Problem Solving: Slow processing, Decreased initiation, Difficulty sequencing, Requires verbal cues, Requires tactile cues General Comments: following only a few simple commands, poor awareness. limited verbalizations during session Difficult to assess due to: Impaired communication   Physical Exam: Blood pressure (!) 152/78, pulse 81, temperature (!) 97.5 F (36.4 C), temperature source Oral, resp. rate (!) 23, height  (1.88 m), weight 57.9 kg, SpO2 98 %. Physical Exam Vitals and nursing note reviewed. Constitutional:      Appearance: He is normal weight.    Comments: Pt restless, but very sleepy, lethargic; in restraints, laying in bed; on IV heparin gtt; and TF's, NAD-   HENT:    Head: Normocephalic and atraumatic.    Comments: Eyes closed 99% of time- not able to assess smile; but appears equal at rest    Nose: Nose normal.    Comments: Cortrak on L side-     Mouth/Throat:    Mouth: Mucous membranes are dry.    Pharynx: Oropharynx is clear. No oropharyngeal exudate. Eyes:    Comments: Eyes closed except for a few seconds- didn't appear to have pupillary differences  Cardiovascular:    Rate and Rhythm: Normal rate and regular rhythm.    Heart sounds: Normal heart sounds. No murmur heard.   No gallop. Pulmonary:    Comments: Slightly coarse with a few rhonchi B/L- good air movement B/L Abdominal:    Comments: Hyperactive; soft, NT, ND  Genitourinary:    Comments: Condom catheter in place- light amber urine and rectal tube- draining stool.  Musculoskeletal:    Cervical back: Normal range of motion and neck supple.    Comments: Moving all extremities spontaneously- could not comply with strength exam- in mittens B/L    Skin:    Comments: Heels slightly boggy B/L Spot on mid thoracic area- 1x1 cm- skin has "taken the top of it". Chest incision- looks good- some dried blood, glued; local erythema only. Has spot on L inner buttock that's blanchable 4cm in diameter- firm no skin breakdown-   Neurological:    Comments: Patient is lethargic but arousable.  He would easily go back to sleep.  He did follow some simple commands.  Overall examination limited due to sedation.   Pt in mittens B/L- had decreased response to sensory stimuli o L side however could be due to sedation vs decreased sensation. Ox1 at best- specifically said had no idea where he was, what year it was.   Psychiatric:    Comments: Flat, confused      Lab Results Last 48 Hours        Results for orders placed or performed during the hospital encounter of 11/04/20 (from the past 48 hour(s))  Basic metabolic panel     Status: Abnormal    Collection Time: 11/11/20 10:59 AM  Result Value Ref Range    Sodium 140 135 - 145 mmol/L    Potassium 3.8 3.5 - 5.1 mmol/L    Chloride 103 98 - 111 mmol/L    CO2 28 22 - 32 mmol/L    Glucose, Bld 103 (H) 70 - 99 mg/dL  Comment: Glucose reference range applies only to samples taken after fasting for at least 8 hours.    BUN 16 6 - 20 mg/dL    Creatinine, Ser 1.610.89 0.61 - 1.24 mg/dL    Calcium 8.7 (L) 8.9 - 10.3 mg/dL    GFR, Estimated >09>60 >60>60 mL/min      Comment: (NOTE) Calculated using the CKD-EPI Creatinine Equation (2021)      Anion gap 9 5 - 15      Comment: Performed at [facility], [address] [place], [place] [address]  Magnesium     Status: None    Collection Time: 11/11/20 10:59 AM  Result Value Ref Range    Magnesium 2.0 1.7 - 2.4 mg/dL      Comment: Performed at [facility], [address] [place], [place] [address]  Phosphorus     Status: None    Collection Time: 11/11/20 10:59 AM  Result Value Ref Range    Phosphorus 4.0 2.5 - 4.6 mg/dL      Comment:  Performed at [facility], [address] [place], [place] [address]  Culture, blood (routine x 2)     Status: None (Preliminary result)    Collection Time: 11/11/20 11:00 AM    Specimen: BLOOD LEFT ARM  Result Value Ref Range    Specimen Description BLOOD LEFT ARM      Special Requests          BOTTLES DRAWN AEROBIC AND ANAEROBIC Blood Culture results may not be optimal due to an inadequate volume of blood received in culture bottles    Culture          NO GROWTH < 24 HOURS Performed at [facility], [address] [place], [place] [address]      Report Status PENDING    Culture, blood (routine x 2)     Status: None (Preliminary result)    Collection Time: 11/11/20 11:06 AM    Specimen: BLOOD  Result Value Ref Range    Specimen Description BLOOD LEFT ANTECUBITAL      Special Requests          BOTTLES DRAWN AEROBIC ONLY Blood Culture results may not be optimal due to an inadequate volume of blood received in culture bottles    Culture          NO GROWTH < 24 HOURS Performed at [facility], [address] [place], [place] [address]      Report Status PENDING    Glucose, capillary     Status: Abnormal    Collection Time: 11/11/20 12:07 PM  Result Value Ref Range    Glucose-Capillary 122 (H) 70 - 99 mg/dL      Comment: Glucose reference range applies only to samples taken after fasting for at least 8 hours.  Heparin level (unfractionated)     Status: Abnormal    Collection Time: 11/11/20  7:14 PM  Result Value Ref Range    Heparin Unfractionated 0.11 (L) 0.30 - 0.70 IU/mL      Comment: (NOTE) The clinical reportable range upper limit is being lowered to >1.10 to align with the FDA approved guidance for the current laboratory assay.   If heparin results are below expected values, and patient dosage has been confirmed, suggest follow up testing of antithrombin III levels. Performed at [facility], [address] [place], [place] [address]     Glucose, capillary  Status: None    Collection Time: 11/11/20  7:53 PM  Result Value Ref Range    Glucose-Capillary 76 70 - 99 mg/dL      Comment: Glucose reference range applies only to samples taken after fasting for at least 8 hours.  Glucose, capillary     Status: Abnormal    Collection Time: 11/12/20 12:10 AM  Result Value Ref Range    Glucose-Capillary 103 (H) 70 - 99 mg/dL      Comment: Glucose reference range applies only to samples taken after fasting for at least 8 hours.  Glucose, capillary     Status: Abnormal    Collection Time: 11/12/20  4:20 AM  Result Value Ref Range    Glucose-Capillary 119 (H) 70 - 99 mg/dL      Comment: Glucose reference range applies only to samples taken after fasting for at least 8 hours.  CBC with Differential/Platelet     Status: Abnormal    Collection Time: 11/12/20  4:22 AM  Result Value Ref Range    WBC 7.0 4.0 - 10.5 K/uL    RBC 3.10 (L) 4.22 - 5.81 MIL/uL    Hemoglobin 10.3 (L) 13.0 - 17.0 g/dL    HCT 16.1 (L) 09.6 - 52.0 %    MCV 101.6 (H) 80.0 - 100.0 fL    MCH 33.2 26.0 - 34.0 pg    MCHC 32.7 30.0 - 36.0 g/dL    RDW 04.5 40.9 - 81.1 %    Platelets 191 150 - 400 K/uL    nRBC 0.0 0.0 - 0.2 %    Neutrophils Relative % 71 %    Neutro Abs 5.0 1.7 - 7.7 K/uL    Lymphocytes Relative 14 %    Lymphs Abs 1.0 0.7 - 4.0 K/uL    Monocytes Relative 11 %    Monocytes Absolute 0.8 0.1 - 1.0 K/uL    Eosinophils Relative 3 %    Eosinophils Absolute 0.2 0.0 - 0.5 K/uL    Basophils Relative 0 %    Basophils Absolute 0.0 0.0 - 0.1 K/uL    Immature Granulocytes 1 %    Abs Immature Granulocytes 0.06 0.00 - 0.07 K/uL      Comment: Performed at [facility], [address] [place], [place] [address]  Basic metabolic panel     Status: Abnormal    Collection Time: 11/12/20  4:22 AM  Result Value Ref Range    Sodium 135 135 - 145 mmol/L    Potassium 4.5 3.5 - 5.1 mmol/L    Chloride 100 98 - 111 mmol/L    CO2 27 22 - 32 mmol/L     Glucose, Bld 126 (H) 70 - 99 mg/dL      Comment: Glucose reference range applies only to samples taken after fasting for at least 8 hours.    BUN 17 6 - 20 mg/dL    Creatinine, Ser 2.95 0.61 - 1.24 mg/dL    Calcium 8.7 (L) 8.9 - 10.3 mg/dL    GFR, Estimated >62 >13 mL/min      Comment: (NOTE) Calculated using the CKD-EPI Creatinine Equation (2021)      Anion gap 8 5 - 15      Comment: Performed at [facility], [address], [place], [place] [address]  Magnesium     Status: None    Collection Time: 11/12/20  4:22 AM  Result Value Ref Range    Magnesium 2.0 1.7 - 2.4 mg/dL  Comment: Performed at [facility], [address] [place], [place] [address]  Phosphorus     Status: None    Collection Time: 11/12/20  4:22 AM  Result Value Ref Range    Phosphorus 4.0 2.5 - 4.6 mg/dL      Comment: Performed at [facility], [address] [place], [place] [address]  HIV Antibody (routine testing w rflx)     Status: None    Collection Time: 11/12/20  4:22 AM  Result Value Ref Range    HIV Screen 4th Generation wRfx Non Reactive Non Reactive      Comment: Performed at [facility], [address], [place], [place] [address]  Valproic acid level     Status: Abnormal    Collection Time: 11/12/20  4:22 AM  Result Value Ref Range    Valproic Acid Lvl 19 (L) 50.0 - 100.0 ug/mL      Comment: Performed at [facility], [address] [place], [place] [address]  Heparin level (unfractionated)     Status: Abnormal    Collection Time: 11/12/20  4:22 AM  Result Value Ref Range    Heparin Unfractionated <0.10 (L) 0.30 - 0.70 IU/mL      Comment: (NOTE) The clinical reportable range upper limit is being lowered to >1.10 to align with the FDA approved guidance for the current laboratory assay.   If heparin results are below expected values, and patient dosage has been confirmed, suggest follow up testing of antithrombin III levels. Performed at [facility], [address], [place], [place] [address]    Glucose, capillary     Status: Abnormal    Collection Time: 11/12/20  6:13 AM  Result Value Ref Range    Glucose-Capillary 123 (H) 70 - 99 mg/dL      Comment: Glucose reference range applies only to samples taken after fasting for at least 8 hours.  Glucose, capillary     Status: None    Collection Time: 11/12/20  7:49 AM  Result Value Ref Range    Glucose-Capillary 83 70 - 99 mg/dL      Comment: Glucose reference range applies only to samples taken after fasting for at least 8 hours.  Glucose, capillary     Status: Abnormal    Collection Time: 11/12/20 11:50 AM  Result Value Ref Range    Glucose-Capillary 116 (H) 70 - 99 mg/dL      Comment: Glucose reference range applies only to samples taken after fasting for at least 8 hours.  Glucose, capillary     Status: None    Collection Time: 11/12/20  4:00 PM  Result Value Ref Range    Glucose-Capillary 98 70 - 99 mg/dL      Comment: Glucose reference range applies only to samples taken after fasting for at least 8 hours.  Glucose, capillary     Status: Abnormal    Collection Time: 11/12/20  8:01 PM  Result Value Ref Range    Glucose-Capillary 107 (H) 70 - 99 mg/dL      Comment: Glucose reference range applies only to samples taken after fasting for at least 8 hours.  Glucose, capillary     Status: Abnormal    Collection Time: 11/12/20 10:58 PM  Result Value Ref Range    Glucose-Capillary 105 (H) 70 - 99 mg/dL      Comment: Glucose reference range applies only to samples taken after fasting for at least 8  hours.  Magnesium     Status: None    Collection Time: 11/13/20 12:41 AM  Result Value Ref Range    Magnesium 2.2 1.7 - 2.4 mg/dL      Comment: Performed at [facility], [address] [place], [place] [address]  Phosphorus     Status: None    Collection Time: 11/13/20 12:41 AM  Result Value Ref Range    Phosphorus 4.5 2.5 - 4.6 mg/dL      Comment: Performed at [facility], [address], [place], [place] [address]  Basic metabolic panel     Status: Abnormal    Collection Time: 11/13/20 12:41 AM  Result Value Ref Range    Sodium 136 135 - 145 mmol/L    Potassium 4.3 3.5 - 5.1 mmol/L    Chloride 101 98 - 111 mmol/L    CO2 26 22 - 32 mmol/L    Glucose, Bld 136 (H) 70 - 99 mg/dL      Comment: Glucose reference range applies only to samples taken after fasting for at least 8 hours.    BUN 21 (H) 6 - 20 mg/dL    Creatinine, Ser 6.16 0.61 - 1.24 mg/dL    Calcium 8.8 (L) 8.9 - 10.3 mg/dL    GFR, Estimated >07 >37 mL/min      Comment: (NOTE) Calculated using the CKD-EPI Creatinine Equation (2021)      Anion gap 9 5 - 15      Comment: Performed at [facility], [address], [place], [place] [address]  CBC     Status: Abnormal    Collection Time: 11/13/20 12:41 AM  Result Value Ref Range    WBC 5.5 4.0 - 10.5 K/uL    RBC 3.11 (L) 4.22 - 5.81 MIL/uL    Hemoglobin 10.3 (L) 13.0 - 17.0 g/dL    HCT 94.8 (L) 54.6 - 52.0 %    MCV 100.3 (H) 80.0 - 100.0 fL    MCH 33.1 26.0 - 34.0 pg    MCHC 33.0 30.0 - 36.0 g/dL    RDW 27.0 35.0 - 09.3 %    Platelets 189 150 - 400 K/uL    nRBC 0.0 0.0 - 0.2 %      Comment: Performed at [facility], [address] [place], [place] [address]  Heparin level (unfractionated)     Status: Abnormal    Collection Time: 11/13/20 12:41 AM  Result Value Ref Range    Heparin Unfractionated <0.10 (L) 0.30 - 0.70 IU/mL      Comment: (NOTE) The clinical reportable range upper limit is being lowered to >1.10 to align with the FDA approved guidance for the current laboratory assay.   If heparin results are below expected values, and patient dosage has been confirmed, suggest follow up testing of antithrombin III levels. Performed at [facility], [address] [place], [place] [address]    Glucose, capillary     Status: Abnormal    Collection Time: 11/13/20  3:55 AM  Result Value Ref Range     Glucose-Capillary 120 (H) 70 - 99 mg/dL      Comment: Glucose reference range applies only to samples taken after fasting for at least 8 hours.  Glucose, capillary     Status: Abnormal    Collection Time: 11/13/20  7:59 AM  Result Value Ref Range    Glucose-Capillary 62 (L) 70 - 99 mg/dL  Comment: Glucose reference range applies only to samples taken after fasting for at least 8 hours.  Glucose, capillary     Status: Abnormal    Collection Time: 11/13/20  8:08 AM  Result Value Ref Range    Glucose-Capillary 58 (L) 70 - 99 mg/dL      Comment: Glucose reference range applies only to samples taken after fasting for at least 8 hours.  Heparin level (unfractionated)     Status: Abnormal    Collection Time: 11/13/20  8:32 AM  Result Value Ref Range    Heparin Unfractionated <0.10 (L) 0.30 - 0.70 IU/mL      Comment: (NOTE) The clinical reportable range upper limit is being lowered to >1.10 to align with the FDA approved guidance for the current laboratory assay.   If heparin results are below expected values, and patient dosage has been confirmed, suggest follow up testing of antithrombin III levels. Performed at [facility], [address], [place], [place] [address]    Glucose, capillary     Status: None    Collection Time: 11/13/20  8:34 AM  Result Value Ref Range    Glucose-Capillary 70 70 - 99 mg/dL      Comment: Glucose reference range applies only to samples taken after fasting for at least 8 hours.       Imaging Results (Last 48 hours)  CT HEAD WO CONTRAST   Result Date: 11/12/2020 CLINICAL DATA:  Neuro deficit, acute stroke suspected. EXAM: CT HEAD WITHOUT CONTRAST TECHNIQUE: Contiguous axial images were obtained from the base of the skull through the vertex without intravenous contrast. COMPARISON:  CT head 11/07/2019. 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 Interval  development faint curvilinear gyral hyperdensity in this region, compatible with petechial hemorrhage. No mass occupying hemorrhagic transformation. Mass effect is mildly progressed with approximately 4 mm of leftward midline shift at the foramen of [place]. No evidence of new/interval acute large vascular territory infarct. No hydrocephalus. Basal cisterns are patent. No extra-axial fluid collection. Vascular: Better evaluated on prior MRA. Skull: No acute fracture. Sinuses/Orbits: Polypoid mucosal thickening in the right sphenoid sinus and mild ethmoid air cell mucosal thickening. No acute orbital findings. Other: No mastoid effusions. 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 No mass occupying hemorrhagic transformation. Cytotoxic edema and mass effect is mildly progressed with approximately 4 mm of leftward midline shift at the foramen of Monro (previously 3 mm when remeasured). Electronically Signed   By: Feliberto Harts MD   On: 11/12/2020 08:02   DG Chest Port 1 View   Result Date: 11/13/2020 CLINICAL DATA:  Shortness of breath. Recent surgery for aortic dissection. EXAM: PORTABLE CHEST 1 VIEW COMPARISON:  Chest x-ray 11/09/2020. FINDINGS: Left costophrenic angle incompletely imaged. Feeding tube is been advanced, tip below left hemidiaphragm. Cardiomegaly again noted. Mild bilateral interstitial prominence suggesting mild interstitial edema. Small right pleural effusion cannot be excluded. No pneumothorax. Surgical clips over the upper chest. IMPRESSION: 1. Interim advancement of feeding tube, tip below left hemidiaphragm. 2. Cardiomegaly again noted. Mild bilateral interstitial prominence suggesting mild interstitial edema. Small right pleural effusion cannot be excluded. Electronically Signed   By: Maisie Fus  Register   On: 11/13/2020 07:29   [place] LT UPPER  EXTREM LTD SOFT TISSUE NON VASCULAR   Result Date: 11/13/2020 CLINICAL DATA:  Pain and  swelling in the antecubital fossa. Evaluate for possible abscess. EXAM: ULTRASOUND left UPPER EXTREMITY LIMITED TECHNIQUE: Ultrasound examination of the upper extremity soft tissues was performed in the area of clinical concern. COMPARISON:  None. FINDINGS: There is moderate inflammation/edema in the antecubital fossa but no discrete fluid collection to suggest a drainable abscess. IMPRESSION: Inflammatory changes with moderate edema in the soft tissues but no discrete abscess. Electronically Signed   By: Rudie Meyer M.D.   On: 11/13/2020 08:10   VAS [place] UPPER EXTREMITY VENOUS DUPLEX   Result Date: 11/11/2020 UPPER VENOUS STUDY  Patient Name:  Jesse [name]  Date of Exam:   11/11/2020 Medical Rec #: [id number]      Accession #:    [id number] Date of Birth: 08-24-1975     Patient Gender: M Patient Age:   32Y Exam Location:  [facility] Procedure:      VAS [place] UPPER EXTREMITY VENOUS DUPLEX Referring Phys: [id number] Steffanie Dunn --------------------------------------------------------------------------------  Indications: Septic thrombophlebitis Limitations: Patient in restraints, constant movement, unable/unwilling to reposition arm. Comparison Study: No prior studies. Performing Technologist: Jean Rosenthal RDMS,RVT  Examination Guidelines: A complete evaluation includes B-mode imaging, spectral Doppler, color Doppler, and power Doppler as needed of all accessible portions of each vessel. Bilateral testing is considered an integral part of a complete examination. Limited examinations for reoccurring indications may be performed as noted.  Left Findings: +--------+------------+---------+-----------+----------+-------+ LEFT    CompressiblePhasicitySpontaneousPropertiesSummary +--------+------------+---------+-----------+----------+-------+ Brachial    Full                                           +--------+------------+---------+-----------+----------+-------+ Radial      Full                                          +--------+------------+---------+-----------+----------+-------+ Ulnar       Full                                          +--------+------------+---------+-----------+----------+-------+ Cephalic  Partial                                  Acute  +--------+------------+---------+-----------+----------+-------+ Basilic   Partial                Yes               Acute  +--------+------------+---------+-----------+----------+-------+  Summary:  Left: Findings consistent with acute superficial vein thrombosis involving the left basilic vein and left cephalic vein.  *See table(s) above for measurements and observations.  Diagnosing physician: Waverly Ferrari MD Electronically signed by Waverly Ferrari MD on 11/11/2020 at 5:13:28 PM.    Final              Medical Problem List and Plan: 1.  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             -patient may  shower after off Heparin gtt and calmer             -  ELOS/Goals: 24-28 days- min A 2.  Antithrombotics: -DVT/anticoagulation/superficial vein thrombosis left arm: Intravenous heparin             -antiplatelet therapy: Aspirin 81 mg daily 3. Pain Management: Tylenol as needed 4. Mood: Wellbutrin 75 mg twice daily, Klonopin 2 mg every 8 hours, valproic acid 250 mg 3 times daily, melatonin nightly as needed             -antipsychotic agents: Seroquel 25 mg twice daily 5. Neuropsych: This patient is not capable of making decisions on his own behalf. 6. Skin/Wound Care: Routine skin checks- has spot on buttocks on L and spine- where top of skin off- doesn't appear to be pressure related; buttocks is indurated/firm but blanchable.  7.  Fluids/Electrolytes/Nutrition: Routine in and outs with follow-up chemistries 8.  Dysphagia decreased nutritional storage.Cortrak in place.  NPO.  Speech therapy follow-up 9.  Hypertension.  Labetalol 200 mg 3 times daily, Norvasc 2.5 mg daily.  Monitor with increased mobility 10. Agitation- Seroquel and Depakote for now- might need titration up soon- will assess.          Mcarthur Rossetti Angiulli, PA-C 11/13/2020    I have personally performed a face to face diagnostic evaluation of this patient and formulated the key components of the plan.  Additionally, I have personally reviewed laboratory data, imaging studies, as well as relevant notes and concur with the physician assistant's documentation above.   The patient's status has not changed from the original H&P.  Any changes in documentation from the acute care chart have been noted above.

## 2020-11-13 NOTE — Progress Notes (Signed)
Inpatient Rehab Admissions Coordinator:   Discussed with Dr. Riley Kill, Dr. Berline Chough, and Dr. Chestine Spore, as well as bedside nurse.  All feel that pt's agitation can be managed on CIR and we have a bed available for him to admit today.  All in agreement for transition to CIR to initiate therapy and continue to work on behavior.  I will call pt's spouse to let her know and let TOC team know.   Estill Dooms, PT, DPT Admissions Coordinator [phone] 11/13/20  10:51 AM

## 2020-11-13 NOTE — Plan of Care (Signed)
Problem: Education: Goal: Knowledge of General Education information will improve Description: Including pain rating scale, medication(s)/side effects and non-pharmacologic comfort measures Outcome: Progressing   Problem: Health Behavior/Discharge Planning: Goal: Ability to manage health-related needs will improve Outcome: Progressing   Problem: Clinical Measurements: Goal: Ability to maintain clinical measurements within normal limits will improve Outcome: Progressing Goal: Will remain free from infection Outcome: Progressing Goal: Diagnostic test results will improve Outcome: Progressing Goal: Respiratory complications will improve Outcome: Progressing Goal: Cardiovascular complication will be avoided Outcome: Progressing   Problem: Activity: Goal: Risk for activity intolerance will decrease Outcome: Progressing   Problem: Nutrition: Goal: Adequate nutrition will be maintained Outcome: Progressing   Problem: Coping: Goal: Level of anxiety will decrease Outcome: Progressing   Problem: Elimination: Goal: Will not experience complications related to bowel motility Outcome: Progressing Goal: Will not experience complications related to urinary retention Outcome: Progressing   Problem: Pain Managment: Goal: General experience of comfort will improve Outcome: Progressing   Problem: Safety: Goal: Ability to remain free from injury will improve Outcome: Progressing   Problem: Skin Integrity: Goal: Risk for impaired skin integrity will decrease Outcome: Progressing   Problem: Education: Goal: Will demonstrate proper wound care and an understanding of methods to prevent future damage Outcome: Progressing Goal: Knowledge of disease or condition will improve Outcome: Progressing Goal: Knowledge of the prescribed therapeutic regimen will improve Outcome: Progressing Goal: Individualized Educational Video(s) Outcome: Progressing   Problem: Activity: Goal: Risk for  activity intolerance will decrease Outcome: Progressing   Problem: Cardiac: Goal: Will achieve and/or maintain hemodynamic stability Outcome: Progressing   Problem: Clinical Measurements: Goal: Postoperative complications will be avoided or minimized Outcome: Progressing   Problem: Respiratory: Goal: Respiratory status will improve Outcome: Progressing   Problem: Skin Integrity: Goal: Wound healing without signs and symptoms of infection Outcome: Progressing Goal: Risk for impaired skin integrity will decrease Outcome: Progressing   Problem: Urinary Elimination: Goal: Ability to achieve and maintain adequate renal perfusion and functioning will improve Outcome: Progressing   Problem: Education: Goal: Will demonstrate proper wound care and an understanding of methods to prevent future damage Outcome: Progressing Goal: Knowledge of disease or condition will improve Outcome: Progressing Goal: Knowledge of the prescribed therapeutic regimen will improve Outcome: Progressing Goal: Individualized Educational Video(s) Outcome: Progressing   Problem: Activity: Goal: Risk for activity intolerance will decrease Outcome: Progressing   Problem: Cardiac: Goal: Will achieve and/or maintain hemodynamic stability Outcome: Progressing   Problem: Clinical Measurements: Goal: Postoperative complications will be avoided or minimized Outcome: Progressing   Problem: Respiratory: Goal: Respiratory status will improve Outcome: Progressing   Problem: Skin Integrity: Goal: Wound healing without signs and symptoms of infection Outcome: Progressing Goal: Risk for impaired skin integrity will decrease Outcome: Progressing   Problem: Urinary Elimination: Goal: Ability to achieve and maintain adequate renal perfusion and functioning will improve Outcome: Progressing   Problem: Safety: Goal: Non-violent Restraint(s) Outcome: Progressing   Problem: Education: Goal: Knowledge of  disease or condition will improve Outcome: Progressing Goal: Knowledge of secondary prevention will improve Outcome: Progressing Goal: Knowledge of patient specific risk factors addressed and post discharge goals established will improve Outcome: Progressing Goal: Individualized Educational Video(s) Outcome: Progressing   Problem: Coping: Goal: Will verbalize positive feelings about self Outcome: Progressing Goal: Will identify appropriate support needs Outcome: Progressing   Problem: Health Behavior/Discharge Planning: Goal: Ability to manage health-related needs will improve Outcome: Progressing   Problem: Self-Care: Goal: Ability to participate in self-care as condition permits will improve Outcome: Progressing Goal: Verbalization   of feelings and concerns over difficulty with self-care will improve Outcome: Progressing Goal: Ability to communicate needs accurately will improve Outcome: Progressing

## 2020-11-13 NOTE — Progress Notes (Signed)
Pharmacy Consult Note  Pharmacy Consult for Heparin Indication: superficial vein thrombosis involving L arm  No Known Allergies  Patient Measurements: Height: 6\' 2"  (188 cm) IBW/kg (Calculated) : 82.2 Heparin Dosing Weight: 61kg  Vital Signs: Temp: 98.9 F (37.2 C) (07/21 1131) Temp Source: Oral (07/21 1131) BP: 151/79 (07/21 1518) Pulse Rate: 77 (07/21 1518)  Labs: Recent Labs    11/11/20 1059 11/11/20 1914 11/12/20 0422 11/13/20 0041 11/13/20 0832 11/13/20 1702  HGB  --   --  10.3* 10.3*  --   --   HCT  --   --  31.5* 31.2*  --   --   PLT  --   --  191 189  --   --   HEPARINUNFRC  --    < > <0.10* <0.10* <0.10* <0.10*  CREATININE 0.89  --  0.99 1.01  --   --    < > = values in this interval not displayed.     Estimated Creatinine Clearance: 74.8 mL/min (by C-G formula based on SCr of 1.01 mg/dL).   Medical History: Past Medical History:  Diagnosis Date   Anxiety    Depression    Essential hypertension    S/P aortic dissection repair 11/05/2020   Straight graft replacement of ascending aorta and proximal transverse aortic arch with re-suspension of native aortic valve and open hemi arch distal anastomosis with aorta to right common carotid bypass and aorta to right subclavian bypass    Medications:  Medications Prior to Admission  Medication Sig Dispense Refill Last Dose   [START ON 11/14/2020] amLODipine (NORVASC) 2.5 MG tablet Place 1 tablet (2.5 mg total) into feeding tube daily.      [START ON 11/14/2020] aspirin 81 MG chewable tablet Place 1 tablet (81 mg total) into feeding tube daily.      [START ON 11/14/2020] bisacodyl (DULCOLAX) 10 MG suppository Place 1 suppository (10 mg total) rectally daily. 12 suppository 0    [START ON 11/14/2020] bisacodyl (DULCOLAX) 5 MG EC tablet Take 2 tablets (10 mg total) by mouth daily. 30 tablet 0    buPROPion (WELLBUTRIN) 75 MG tablet Place 1 tablet (75 mg total) into feeding tube 2 (two) times daily.      chlorhexidine  (PERIDEX) 0.12 % solution 15 mLs by Mouth Rinse route 2 (two) times daily. 120 mL 0    clonazePAM (KLONOPIN) 2 MG disintegrating tablet Place 1 tablet (2 mg total) into feeding tube every 8 (eight) hours. 30 tablet 0    docusate (COLACE) 50 MG/5ML liquid Place 10 mLs (100 mg total) into feeding tube 2 (two) times daily. 100 mL 0    [START ON 11/14/2020] folic acid (FOLVITE) 1 MG tablet Place 1 tablet (1 mg total) into feeding tube daily.      heparin 11/16/2020 UT/250ML infusion Inject 1,550 Units/hr into the vein continuous.      hydrALAZINE (APRESOLINE) 20 MG/ML injection Inject 0.5 mLs (10 mg total) into the vein every 6 (six) hours as needed. 1 mL     labetalol (NORMODYNE) 200 MG tablet Place 1 tablet (200 mg total) into feeding tube 3 (three) times daily.      labetalol (NORMODYNE) 5 MG/ML injection Inject 2 mLs (10 mg total) into the vein every hour as needed (for MAP > 95). 20 mL     LORazepam (ATIVAN) 2 MG/ML injection Inject 0.5-1 mLs (1-2 mg total) into the vein every 2 (two) hours as needed for sedation. 1 mL 0    melatonin  5 MG TABS Place 1 tablet (5 mg total) into feeding tube at bedtime as needed.  0    Mouthwashes (MOUTH RINSE) LIQD solution 15 mLs by Mouth Rinse route 2 (two) times daily.  0    [START ON 11/14/2020] Multiple Vitamin (MULTIVITAMIN) LIQD Place 15 mLs into feeding tube daily.      Nutritional Supplements (FEEDING SUPPLEMENT, OSMOLITE 1.5 CAL,) LIQD Place 1,000 mLs into feeding tube continuous.  0    Nutritional Supplements (FEEDING SUPPLEMENT, PROSOURCE TF,) liquid Place 45 mLs into feeding tube 3 (three) times daily.      [START ON 11/14/2020] polyethylene glycol (MIRALAX / GLYCOLAX) 17 g packet Place 17 g into feeding tube daily. 14 each 0    [START ON 11/14/2020] predniSONE 5 MG/5ML solution Place 10 mLs (10 mg total) into feeding tube daily with breakfast. 100 mL 0    QUEtiapine (SEROQUEL) 25 MG tablet Place 1 tablet (25 mg total) into feeding tube 2 (two) times daily.       [START ON 11/14/2020] thiamine 100 MG tablet Place 1 tablet (100 mg total) into feeding tube daily.      valproic acid (DEPAKENE) 250 MG/5ML solution Place 5 mLs (250 mg total) into feeding tube 3 (three) times daily. 600 mL     Water For Irrigation, Sterile (FREE WATER) SOLN Place 100 mLs into feeding tube every 8 (eight) hours.       Assessment: 45 year old male s/p aortic dissection repair with neurological complications noted. Patient now noted to have cellulitis vs thrombophlebitis and now ultrasound noted superficial vein thrombosis. New orders to start IV heparin for SVT until further workup can be done. Heparin briefly held 7/20 for petechial hemorrhage, then resumed.  Heparin remains undetectable, confirmed was not paused on transfer from [facility].  Goal of Therapy:  Heparin level 0.3-0.5 units/ml (low goal d/t prior bleeding) Monitor platelets by anticoagulation protocol: Yes   Plan:  Increase heparin to 1750 units/h Recheck heparin level in am  Fredonia Highland, PharmD, BCPS, [facility] Clinical Pharmacist Please check AMION for all [facility] numbers 11/13/2020

## 2020-11-13 NOTE — H&P (Signed)
Physical Medicine and Rehabilitation Admission H&P    Chief Complaint  Patient presents with   Code Stroke  : HPI: Jesse [name] is a 45 year old right-handed male with history of anxiety/depression with longstanding tobacco alcohol use.  Per chart review patient lives with spouse.  Reportedly independent prior to admission.  Presented 11/05/2020 with acute onset of aphasia and blurred vision as well as altered mental status.  Admission chemistries unremarkable except sodium 133 glucose 112 creatinine 1.32, alcohol negative, WBC 12,700.  Cranial CT scan negative for acute changes.  Question subtle small loss of gray-white differentiation in the right insular region.  CT angiogram of chest as well as head and neck showed partially imaged type of a aortic dissection involving the ascending aorta and transverse portion but not the descending aorta.  Dissection involves innominate artery with thrombosis of the false lumen.  This extended into the right common carotid with narrowing of the true lumen to a minimal diameter of 1.5 mm.  No proximal intracranial vessel occlusion.  There was noted occlusion of small distal right M2 and proximal M3 MCA branch.  Echocardiogram with ejection fraction of 55 to 60%.  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 Cornelius Moras.  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  There was a superficial vein thrombosis left arm noted and intravenous heparin  was initiated 11/12/2020 as well as remaining on low-dose aspirin.  EEG completed showing no seizure activity.  Hospital course patient did require intubation followed by pulmonary services.  Patient currently remains n.p.o. with alternative means of nutritional support.  Hospital course complicated by severe agitation delirium and impulsiveness placed on  Seroquel and valproic acid.  Therapy evaluations completed due to patient decreased functional ability altered mental status was admitted for a comprehensive rehab program.  Pt woke but not able to communicate more than social niceties.    Review of Systems  Unable to perform ROS: Mental acuity  Constitutional:  Negative for chills and fever.  HENT:  Negative for hearing loss.   Eyes:  Positive for blurred vision. Negative for double vision.  Respiratory:  Negative for cough and shortness of breath.   Cardiovascular:  Negative for chest pain, palpitations and leg swelling.  Gastrointestinal:  Positive for diarrhea. Negative for heartburn, nausea and vomiting.  Genitourinary:  Negative for dysuria, flank pain and hematuria.  Skin:  Negative for rash.  Neurological:  Positive for speech change and headaches.  Psychiatric/Behavioral:  Positive for depression.        Anxiety  All other systems reviewed and are negative. Past Medical History:  Diagnosis Date   Anxiety    Depression    Essential hypertension    S/P aortic dissection repair 11/05/2020   Straight graft replacement of ascending aorta and proximal transverse aortic arch with re-suspension of native aortic valve and open hemi arch distal anastomosis with aorta to right common carotid bypass and aorta to right subclavian bypass   Past Surgical History:  Procedure Laterality Date   ASCENDING AORTIC ROOT REPLACEMENT N/A 11/04/2020   Procedure: 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 WITH REPLACEMENT  OF ASCENDING AORTA AND HEMIARCH USING HEMASHIELD PLATINUM GRAFT AND  HEMASHIELD GOLD 14 X GRAFT, RESUSPENSION OF NATIVE VALVE, AORTA TO RIGHT CAROTID BYPASS, AORTA TO RIGHT SUBCLAVIAN BYPASS;  Surgeon: Purcell Nails, MD;  Location: [facility] OR;  Service: Open Heart Surgery;  Laterality: N/A;   TEE WITHOUT CARDIOVERSION N/A 11/04/2020   Procedure: TRANSESOPHAGEAL ECHOCARDIOGRAM (TEE);  Surgeon: Purcell Nails, MD;  Location: [facility] OR;  Service: Open Heart Surgery;  Laterality: N/A;   History reviewed. No pertinent family history. Social History:  reports that he has been smoking cigarettes. He does not have any smokeless tobacco history on file. He reports current alcohol use. He reports previous drug use. Allergies: No Known Allergies Medications Prior to Admission  Medication Sig Dispense Refill   aspirin-acetaminophen-caffeine (EXCEDRIN MIGRAINE) 250-250-65 MG tablet Take 2 tablets by mouth every 6 (six) hours as needed for headache.     diphenhydrAMINE (BENADRYL) 25 MG tablet Take 50-75 mg by mouth every 6 (six) hours as needed for sleep.     Melatonin 10 MG TABS Take 10 mg by mouth at bedtime as needed (sleep).     Potassium 95 MG TABS Take 95 mg by mouth daily as needed (cramps).      Drug Regimen Review Drug regimen was reviewed and remains appropriate with no significant issues identified  Home: Home Living Family/patient expects to be discharged to:: Unsure Living Arrangements: Spouse/significant other Additional Comments: pt with aphasia and still on some sedation, PT/OT unable to obtain history   Functional History: Prior Function Level of Independence:  (unsure, assume independent as pt was working in yard prior to symptom onset) Comments: unsure, assume independent (pt working in yard prior to onset)  Functional Status:  Mobility: Bed Mobility Overal bed mobility: Needs Assistance Bed Mobility: Supine to Sit, Sit to Supine Supine to sit: +2 for physical assistance, +2 for safety/equipment, Total assist Sit to supine: Max assist, +2 for  safety/equipment, +2 for physical assistance General bed mobility comments: total assist +2 to EOB due to lethargy, max assist to return supine Transfers Overall transfer level: Needs assistance Equipment used: 2 person hand held assist Transfers: Sit to/from Stand Sit to Stand: Mod assist, +2 safety/equipment Stand pivot transfers: Mod assist, +2 safety/equipment General transfer comment: Mod assist to initiate and then +2 min assist to complete stand, support to ensure not pushing with UEs Ambulation/Gait Ambulation/Gait assistance: Mod assist, +2 physical assistance Gait Distance (Feet): 3 Feet (forward and back) Assistive device: 1 person hand held assist, 2 person hand held assist Gait Pattern/deviations: Step-to pattern, Decreased step length - right, Decreased step length - left, Decreased stride length General Gait Details: uncoordinated, staggered steps with scissoring and overall instability Gait velocity interpretation: <1.31 ft/sec, indicative of household ambulator    ADL: ADL Overall ADL's : Needs assistance/impaired Eating/Feeding: NPO Grooming: Total assistance, Standing, Sitting, Wash/dry face, Brushing hair Grooming Details (indicate cue type and reason): EOB R hand with hand over hand support total assist Lower Body Dressing: Total assistance, +2 for safety/equipment, +2 for physical assistance, Sit to/from stand Functional mobility during ADLs: Maximal assistance, +2 for physical assistance, +2 for safety/equipment, Cueing for safety, Cueing for sequencing General ADL Comments: total assist for all self care at this time  Cognition: Cognition Overall Cognitive Status: Difficult to assess Orientation Level: Oriented to person, Disoriented to place, Disoriented to time, Disoriented to situation Cognition Arousal/Alertness: Lethargic Behavior During Therapy: Impulsive, Restless, Agitated Overall Cognitive Status: Difficult to assess Area of Impairment: Attention,  Following commands, Awareness, Problem solving Current Attention Level: Focused Following Commands: Follows one step commands inconsistently, Follows one step commands with increased time Awareness: Intellectual Problem Solving: Slow processing, Decreased initiation, Difficulty sequencing, Requires verbal cues, Requires tactile cues General Comments: following only a few simple commands, poor awareness. limited verbalizations during session Difficult to assess due to: Impaired communication  Physical Exam: Blood pressure (!) 152/78, pulse 81, temperature (!) 97.5 F (36.4 C), temperature source Oral, resp. rate (!) 23, height 6\' 2"  (1.88 m), weight 57.9 kg, SpO2 98 %. Physical Exam Vitals and nursing note reviewed.  Constitutional:      Appearance: He is normal weight.     Comments: Pt restless, but very sleepy, lethargic; in restraints, laying in bed; on IV heparin gtt; and TF's, NAD-   HENT:     Head: Normocephalic and atraumatic.     Comments: Eyes closed 99% of time- not able to assess smile; but appears equal at rest    Nose: Nose normal.     Comments: Cortrak on L side-     Mouth/Throat:     Mouth: Mucous membranes are dry.     Pharynx: Oropharynx is clear. No oropharyngeal exudate.  Eyes:     Comments: Eyes closed except for a few seconds- didn't appear to have pupillary differences  Cardiovascular:     Rate and Rhythm: Normal rate and regular rhythm.     Heart sounds: Normal heart sounds. No murmur heard.   No gallop.  Pulmonary:     Comments: Slightly coarse with a few rhonchi B/L- good air movement B/L Abdominal:     Comments: Hyperactive; soft, NT, ND  Genitourinary:    Comments: Condom catheter in place- light amber urine and rectal tube- draining stool.  Musculoskeletal:     Cervical back: Normal range of motion and neck supple.     Comments: Moving all extremities spontaneously- could not comply with strength exam- in mittens B/L   Skin:    Comments: Heels  slightly boggy B/L Spot on mid thoracic area- 1x1 cm- skin has "taken the top of it".  Chest incision- looks good- some dried blood, glued; local erythema only. Has spot on L inner buttock that's blanchable 4cm in diameter- firm no skin breakdown-   Neurological:     Comments: Patient is lethargic but arousable.  He would easily go back to sleep.  He did follow some simple commands.  Overall examination limited due to sedation.  Pt in mittens B/L- had decreased response to sensory stimuli o L side however could be due to sedation vs decreased sensation.  Ox1 at best- specifically said had no idea where he was, what year it was.   Psychiatric:     Comments: Flat, confused    Results for orders placed or performed during the hospital encounter of 11/04/20 (from the past 48 hour(s))  Basic metabolic panel     Status: Abnormal   Collection Time: 11/11/20 10:59 AM  Result Value Ref Range   Sodium 140 135 - 145 mmol/L   Potassium 3.8 3.5 - 5.1 mmol/L   Chloride 103 98 - 111 mmol/L   CO2 28 22 - 32 mmol/L   Glucose, Bld 103 (H) 70 - 99 mg/dL    Comment: Glucose reference range applies only to samples taken after fasting for at least 8 hours.   BUN 16 6 - 20 mg/dL   Creatinine, Ser 4.090.89 0.61 - 1.24 mg/dL   Calcium 8.7 (L) 8.9 - 10.3  mg/dL   GFR, Estimated >65 >78 mL/min    Comment: (NOTE) Calculated using the CKD-EPI Creatinine Equation (2021)    Anion gap 9 5 - 15    Comment: Performed at [facility], [address] [place], [place] [address]  Magnesium     Status: None   Collection Time: 11/11/20 10:59 AM  Result Value Ref Range   Magnesium 2.0 1.7 - 2.4 mg/dL    Comment: Performed at [facility], [address] [place], [place] [address]  Phosphorus     Status: None   Collection Time: 11/11/20 10:59 AM  Result Value Ref Range   Phosphorus 4.0 2.5 - 4.6 mg/dL    Comment: Performed at [facility], [address], [place], [place] [address]  Culture, blood  (routine x 2)     Status: None (Preliminary result)   Collection Time: 11/11/20 11:00 AM   Specimen: BLOOD LEFT ARM  Result Value Ref Range   Specimen Description BLOOD LEFT ARM    Special Requests      BOTTLES DRAWN AEROBIC AND ANAEROBIC Blood Culture results may not be optimal due to an inadequate volume of blood received in culture bottles   Culture      NO GROWTH < 24 HOURS Performed at [facility], [address] [place], [place] [address]    Report Status PENDING   Culture, blood (routine x 2)     Status: None (Preliminary result)   Collection Time: 11/11/20 11:06 AM   Specimen: BLOOD  Result Value Ref Range   Specimen Description BLOOD LEFT ANTECUBITAL    Special Requests      BOTTLES DRAWN AEROBIC ONLY Blood Culture results may not be optimal due to an inadequate volume of blood received in culture bottles   Culture      NO GROWTH < 24 HOURS Performed at [facility], [address], [place], [place] [address]    Report Status PENDING   Glucose, capillary     Status: Abnormal   Collection Time: 11/11/20 12:07 PM  Result Value Ref Range   Glucose-Capillary 122 (H) 70 - 99 mg/dL    Comment: Glucose reference range applies only to samples taken after fasting for at least 8 hours.  Heparin level (unfractionated)     Status: Abnormal   Collection Time: 11/11/20  7:14 PM  Result Value Ref Range   Heparin Unfractionated 0.11 (L) 0.30 - 0.70 IU/mL    Comment: (NOTE) The clinical reportable range upper limit is being lowered to >1.10 to align with the FDA approved guidance for the current laboratory assay.  If heparin results are below expected values, and patient dosage has  been confirmed, suggest follow up testing of antithrombin III levels. Performed at [facility], [address] [place], [place] [address]   Glucose, capillary     Status: None   Collection Time: 11/11/20  7:53 PM  Result Value Ref Range   Glucose-Capillary 76 70 - 99 mg/dL     Comment: Glucose reference range applies only to samples taken after fasting for at least 8 hours.  Glucose, capillary     Status: Abnormal   Collection Time: 11/12/20 12:10 AM  Result Value Ref Range   Glucose-Capillary 103 (H) 70 - 99 mg/dL    Comment: Glucose reference range applies only to samples taken after fasting for at least 8 hours.  Glucose, capillary     Status: Abnormal  Collection Time: 11/12/20  4:20 AM  Result Value Ref Range   Glucose-Capillary 119 (H) 70 - 99 mg/dL    Comment: Glucose reference range applies only to samples taken after fasting for at least 8 hours.  CBC with Differential/Platelet     Status: Abnormal   Collection Time: 11/12/20  4:22 AM  Result Value Ref Range   WBC 7.0 4.0 - 10.5 K/uL   RBC 3.10 (L) 4.22 - 5.81 MIL/uL   Hemoglobin 10.3 (L) 13.0 - 17.0 g/dL   HCT 16.1 (L) 09.6 - 04.5 %   MCV 101.6 (H) 80.0 - 100.0 fL   MCH 33.2 26.0 - 34.0 pg   MCHC 32.7 30.0 - 36.0 g/dL   RDW 40.9 81.1 - 91.4 %   Platelets 191 150 - 400 K/uL   nRBC 0.0 0.0 - 0.2 %   Neutrophils Relative % 71 %   Neutro Abs 5.0 1.7 - 7.7 K/uL   Lymphocytes Relative 14 %   Lymphs Abs 1.0 0.7 - 4.0 K/uL   Monocytes Relative 11 %   Monocytes Absolute 0.8 0.1 - 1.0 K/uL   Eosinophils Relative 3 %   Eosinophils Absolute 0.2 0.0 - 0.5 K/uL   Basophils Relative 0 %   Basophils Absolute 0.0 0.0 - 0.1 K/uL   Immature Granulocytes 1 %   Abs Immature Granulocytes 0.06 0.00 - 0.07 K/uL    Comment: Performed at [facility], [address] [place], [place] [address]  Basic metabolic panel     Status: Abnormal   Collection Time: 11/12/20  4:22 AM  Result Value Ref Range   Sodium 135 135 - 145 mmol/L   Potassium 4.5 3.5 - 5.1 mmol/L   Chloride 100 98 - 111 mmol/L   CO2 27 22 - 32 mmol/L   Glucose, Bld 126 (H) 70 - 99 mg/dL    Comment: Glucose reference range applies only to samples taken after fasting for at least 8 hours.   BUN 17 6 - 20 mg/dL   Creatinine, Ser 6.21 0.61 -  1.24 mg/dL   Calcium 8.7 (L) 8.9 - 10.3 mg/dL   GFR, Estimated >30 >86 mL/min    Comment: (NOTE) Calculated using the CKD-EPI Creatinine Equation (2021)    Anion gap 8 5 - 15    Comment: Performed at [facility], [address], [place], [place] [address]  Magnesium     Status: None   Collection Time: 11/12/20  4:22 AM  Result Value Ref Range   Magnesium 2.0 1.7 - 2.4 mg/dL    Comment: Performed at [facility], [address] [place], [place] [address]  Phosphorus     Status: None   Collection Time: 11/12/20  4:22 AM  Result Value Ref Range   Phosphorus 4.0 2.5 - 4.6 mg/dL    Comment: Performed at [facility], [address] [place], [place] [address]  HIV Antibody (routine testing w rflx)     Status: None   Collection Time: 11/12/20  4:22 AM  Result Value Ref Range   HIV Screen 4th Generation wRfx Non Reactive Non Reactive    Comment: Performed at [facility], [address], [place], [place] [address]  Valproic acid level     Status: Abnormal   Collection Time: 11/12/20  4:22 AM  Result Value Ref Range   Valproic Acid Lvl 19 (L) 50.0 - 100.0 ug/mL    Comment: Performed at [facility]  Lab, [address] [place], [place] [address]  Heparin level (unfractionated)     Status: Abnormal   Collection Time: 11/12/20  4:22 AM  Result Value Ref Range   Heparin Unfractionated <0.10 (L) 0.30 - 0.70 IU/mL    Comment: (NOTE) The clinical reportable range upper limit is being lowered to >1.10 to align with the FDA approved guidance for the current laboratory assay.  If heparin results are below expected values, and patient dosage has  been confirmed, suggest follow up testing of antithrombin III levels. Performed at [facility], [address], [place], [place] [address]   Glucose, capillary     Status: Abnormal   Collection Time: 11/12/20  6:13 AM  Result Value Ref Range   Glucose-Capillary 123 (H) 70 - 99 mg/dL    Comment: Glucose reference  range applies only to samples taken after fasting for at least 8 hours.  Glucose, capillary     Status: None   Collection Time: 11/12/20  7:49 AM  Result Value Ref Range   Glucose-Capillary 83 70 - 99 mg/dL    Comment: Glucose reference range applies only to samples taken after fasting for at least 8 hours.  Glucose, capillary     Status: Abnormal   Collection Time: 11/12/20 11:50 AM  Result Value Ref Range   Glucose-Capillary 116 (H) 70 - 99 mg/dL    Comment: Glucose reference range applies only to samples taken after fasting for at least 8 hours.  Glucose, capillary     Status: None   Collection Time: 11/12/20  4:00 PM  Result Value Ref Range   Glucose-Capillary 98 70 - 99 mg/dL    Comment: Glucose reference range applies only to samples taken after fasting for at least 8 hours.  Glucose, capillary     Status: Abnormal   Collection Time: 11/12/20  8:01 PM  Result Value Ref Range   Glucose-Capillary 107 (H) 70 - 99 mg/dL    Comment: Glucose reference range applies only to samples taken after fasting for at least 8 hours.  Glucose, capillary     Status: Abnormal   Collection Time: 11/12/20 10:58 PM  Result Value Ref Range   Glucose-Capillary 105 (H) 70 - 99 mg/dL    Comment: Glucose reference range applies only to samples taken after fasting for at least 8 hours.  Magnesium     Status: None   Collection Time: 11/13/20 12:41 AM  Result Value Ref Range   Magnesium 2.2 1.7 - 2.4 mg/dL    Comment: Performed at [facility], [address], [place], [place] [address]  Phosphorus     Status: None   Collection Time: 11/13/20 12:41 AM  Result Value Ref Range   Phosphorus 4.5 2.5 - 4.6 mg/dL    Comment: Performed at [facility], [address] [place], [place] [address]  Basic metabolic panel     Status: Abnormal   Collection Time: 11/13/20 12:41 AM  Result Value Ref Range   Sodium 136 135 - 145 mmol/L   Potassium 4.3 3.5 - 5.1 mmol/L   Chloride 101 98 - 111 mmol/L    CO2 26 22 - 32 mmol/L   Glucose, Bld 136 (H) 70 - 99 mg/dL    Comment: Glucose reference range applies only to samples taken after fasting for at least 8 hours.   BUN 21 (H) 6 - 20 mg/dL   Creatinine, Ser 2.95 0.61 - 1.24 mg/dL   Calcium 8.8 (L)  8.9 - 10.3 mg/dL   GFR, Estimated >16 >10 mL/min    Comment: (NOTE) Calculated using the CKD-EPI Creatinine Equation (2021)    Anion gap 9 5 - 15    Comment: Performed at [facility], [address] [place], [place] [address]  CBC     Status: Abnormal   Collection Time: 11/13/20 12:41 AM  Result Value Ref Range   WBC 5.5 4.0 - 10.5 K/uL   RBC 3.11 (L) 4.22 - 5.81 MIL/uL   Hemoglobin 10.3 (L) 13.0 - 17.0 g/dL   HCT 40.9 (L) 81.1 - 91.4 %   MCV 100.3 (H) 80.0 - 100.0 fL   MCH 33.1 26.0 - 34.0 pg   MCHC 33.0 30.0 - 36.0 g/dL   RDW 78.2 95.6 - 21.3 %   Platelets 189 150 - 400 K/uL   nRBC 0.0 0.0 - 0.2 %    Comment: Performed at [facility], [address] [place], [place] [address]  Heparin level (unfractionated)     Status: Abnormal   Collection Time: 11/13/20 12:41 AM  Result Value Ref Range   Heparin Unfractionated <0.10 (L) 0.30 - 0.70 IU/mL    Comment: (NOTE) The clinical reportable range upper limit is being lowered to >1.10 to align with the FDA approved guidance for the current laboratory assay.  If heparin results are below expected values, and patient dosage has  been confirmed, suggest follow up testing of antithrombin III levels. Performed at [facility], [address], [place], [place] [address]   Glucose, capillary     Status: Abnormal   Collection Time: 11/13/20  3:55 AM  Result Value Ref Range   Glucose-Capillary 120 (H) 70 - 99 mg/dL    Comment: Glucose reference range applies only to samples taken after fasting for at least 8 hours.  Glucose, capillary     Status: Abnormal   Collection Time: 11/13/20  7:59 AM  Result Value Ref Range   Glucose-Capillary 62 (L) 70 - 99 mg/dL    Comment:  Glucose reference range applies only to samples taken after fasting for at least 8 hours.  Glucose, capillary     Status: Abnormal   Collection Time: 11/13/20  8:08 AM  Result Value Ref Range   Glucose-Capillary 58 (L) 70 - 99 mg/dL    Comment: Glucose reference range applies only to samples taken after fasting for at least 8 hours.  Heparin level (unfractionated)     Status: Abnormal   Collection Time: 11/13/20  8:32 AM  Result Value Ref Range   Heparin Unfractionated <0.10 (L) 0.30 - 0.70 IU/mL    Comment: (NOTE) The clinical reportable range upper limit is being lowered to >1.10 to align with the FDA approved guidance for the current laboratory assay.  If heparin results are below expected values, and patient dosage has  been confirmed, suggest follow up testing of antithrombin III levels. Performed at [facility], [address] [place], [place] [address]   Glucose, capillary     Status: None   Collection Time: 11/13/20  8:34 AM  Result Value Ref Range   Glucose-Capillary 70 70 - 99 mg/dL    Comment: Glucose reference range applies only to samples taken after fasting for at least 8 hours.   CT HEAD WO CONTRAST  Result Date: 11/12/2020 CLINICAL DATA:  Neuro deficit, acute stroke suspected. EXAM: CT HEAD WITHOUT CONTRAST TECHNIQUE: Contiguous axial images were obtained from the base of the skull  through the vertex without intravenous contrast. COMPARISON:  CT head 11/07/2019. 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 Interval development faint curvilinear gyral hyperdensity in this region, compatible with petechial hemorrhage. No mass occupying hemorrhagic transformation. Mass effect is mildly progressed with approximately 4 mm of leftward midline shift at the foramen of Monro. No evidence of new/interval acute large vascular territory infarct. No hydrocephalus. Basal cisterns are  patent. No extra-axial fluid collection. Vascular: Better evaluated on prior MRA. Skull: No acute fracture. Sinuses/Orbits: Polypoid mucosal thickening in the right sphenoid sinus and mild ethmoid air cell mucosal thickening. No acute orbital findings. Other: No mastoid effusions. 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 No mass occupying hemorrhagic transformation. Cytotoxic edema and mass effect is mildly progressed with approximately 4 mm of leftward midline shift at the foramen of [place] (previously 3 mm when remeasured). Electronically Signed   By: Feliberto Harts MD   On: 11/12/2020 08:02   DG Chest Port 1 View  Result Date: 11/13/2020 CLINICAL DATA:  Shortness of breath. Recent surgery for aortic dissection. EXAM: PORTABLE CHEST 1 VIEW COMPARISON:  Chest x-ray 11/09/2020. FINDINGS: Left costophrenic angle incompletely imaged. Feeding tube is been advanced, tip below left hemidiaphragm. Cardiomegaly again noted. Mild bilateral interstitial prominence suggesting mild interstitial edema. Small right pleural effusion cannot be excluded. No pneumothorax. Surgical clips over the upper chest. IMPRESSION: 1. Interim advancement of feeding tube, tip below left hemidiaphragm. 2. Cardiomegaly again noted. Mild bilateral interstitial prominence suggesting mild interstitial edema. Small right pleural effusion cannot be excluded. Electronically Signed   By: Maisie Fus  Register   On: 11/13/2020 07:29   [place] LT UPPER EXTREM LTD SOFT TISSUE NON VASCULAR  Result Date: 11/13/2020 CLINICAL DATA:  Pain and swelling in the antecubital fossa. Evaluate for possible abscess. EXAM: ULTRASOUND left UPPER EXTREMITY LIMITED TECHNIQUE: Ultrasound examination of the upper extremity soft tissues was performed in the area of clinical concern. COMPARISON:  None. FINDINGS: There is moderate  inflammation/edema in the antecubital fossa but no discrete fluid collection to suggest a drainable abscess. IMPRESSION: Inflammatory changes with moderate edema in the soft tissues but no discrete abscess. Electronically Signed   By: Rudie Meyer M.D.   On: 11/13/2020 08:10   VAS [place] UPPER EXTREMITY VENOUS DUPLEX  Result Date: 11/11/2020 UPPER VENOUS STUDY  Patient Name:  Jesse [name]  Date of Exam:   11/11/2020 Medical Rec #: [id number]      Accession #:    [id number] Date of Birth: 06-Apr-1976     Patient Gender: M Patient Age:   63Y Exam Location:  [facility] Procedure:      VAS [place] UPPER EXTREMITY VENOUS DUPLEX Referring Phys: [id number] Steffanie Dunn --------------------------------------------------------------------------------  Indications: Septic thrombophlebitis Limitations: Patient in restraints, constant movement, unable/unwilling to reposition arm. Comparison Study: No prior studies. Performing Technologist: Jean Rosenthal RDMS,RVT  Examination Guidelines: A complete evaluation includes B-mode imaging, spectral Doppler, color Doppler, and power Doppler as needed of all accessible portions of each vessel. Bilateral testing is considered an integral part of a complete examination. Limited examinations for reoccurring indications may be performed as noted.  Left Findings: +--------+------------+---------+-----------+----------+-------+ LEFT    CompressiblePhasicitySpontaneousPropertiesSummary +--------+------------+---------+-----------+----------+-------+ Brachial    Full                                          +--------+------------+---------+-----------+----------+-------+  Radial      Full                                          +--------+------------+---------+-----------+----------+-------+ Ulnar       Full                                          +--------+------------+---------+-----------+----------+-------+ Cephalic  Partial                                   Acute  +--------+------------+---------+-----------+----------+-------+ Basilic   Partial                Yes               Acute  +--------+------------+---------+-----------+----------+-------+  Summary:  Left: Findings consistent with acute superficial vein thrombosis involving the left basilic vein and left cephalic vein.  *See table(s) above for measurements and observations.  Diagnosing physician: Waverly Ferrari MD Electronically signed by Waverly Ferrari MD on 11/11/2020 at 5:13:28 PM.    Final        Medical Problem List and Plan: 1.  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  -patient may  shower after off Heparin gtt and calmer  -ELOS/Goals: 24-28 days- min A 2.  Antithrombotics: -DVT/anticoagulation/superficial vein thrombosis left arm: Intravenous heparin  -antiplatelet therapy: Aspirin 81 mg daily 3. Pain Management: Tylenol as needed 4. Mood: Wellbutrin 75 mg twice daily, Klonopin 2 mg every 8 hours, valproic acid 250 mg 3 times daily, melatonin nightly as needed  -antipsychotic agents: Seroquel 25 mg twice daily 5. Neuropsych: This patient is not capable of making decisions on his own behalf. 6. Skin/Wound Care: Routine skin checks- has spot on buttocks on L and spine- where top of skin off- doesn't appear to be pressure related; buttocks is indurated/firm but blanchable.  7. Fluids/Electrolytes/Nutrition: Routine in and outs with follow-up chemistries 8.  Dysphagia decreased nutritional storage.Cortrak in place.  NPO.  Speech therapy follow-up 9.  Hypertension.  Labetalol 200 mg 3 times daily, Norvasc 2.5 mg daily.  Monitor with increased mobility 10. Agitation- Seroquel and Depakote for now- might need titration up soon- will assess.      Mcarthur Rossetti Angiulli, PA-C 11/13/2020   I have personally performed a  face to face diagnostic evaluation of this patient and formulated the key components of the plan.  Additionally, I have personally reviewed laboratory data, imaging studies, as well as relevant notes and concur with the physician assistant's documentation above.   The patient's status has not changed from the original H&P.  Any changes in documentation from the acute care chart have been noted above.

## 2020-11-13 NOTE — Progress Notes (Signed)
Pharmacy Consult Note  Pharmacy Consult for Heparin Indication: superficial vein thrombosis involving L arm  No Known Allergies  Patient Measurements: Height: 6\' 2"  (188 cm) Weight: 57.9 kg (127 lb 10.3 oz) IBW/kg (Calculated) : 82.2 Heparin Dosing Weight: 61kg  Vital Signs: Temp: 97.5 F (36.4 C) (07/21 0810) Temp Source: Oral (07/21 0810) BP: 152/78 (07/21 0900) Pulse Rate: 81 (07/21 0900)  Labs: Recent Labs    11/11/20 1059 11/11/20 1914 11/12/20 0422 11/13/20 0041 11/13/20 0832  HGB  --   --  10.3* 10.3*  --   HCT  --   --  31.5* 31.2*  --   PLT  --   --  191 189  --   HEPARINUNFRC  --    < > <0.10* <0.10* <0.10*  CREATININE 0.89  --  0.99 1.01  --    < > = values in this interval not displayed.     Estimated Creatinine Clearance: 74.8 mL/min (by C-G formula based on SCr of 1.01 mg/dL).   Medical History: Past Medical History:  Diagnosis Date   Anxiety    Depression    Essential hypertension    S/P aortic dissection repair 11/05/2020   Straight graft replacement of ascending aorta and proximal transverse aortic arch with re-suspension of native aortic valve and open hemi arch distal anastomosis with aorta to right common carotid bypass and aorta to right subclavian bypass    Medications:  Medications Prior to Admission  Medication Sig Dispense Refill Last Dose   aspirin-acetaminophen-caffeine (EXCEDRIN MIGRAINE) 250-250-65 MG tablet Take 2 tablets by mouth every 6 (six) hours as needed for headache.   11/04/2020 at 0800   diphenhydrAMINE (BENADRYL) 25 MG tablet Take 50-75 mg by mouth every 6 (six) hours as needed for sleep.   11/04/2020 at 1500   Melatonin 10 MG TABS Take 10 mg by mouth at bedtime as needed (sleep).   11/03/2020   Potassium 95 MG TABS Take 95 mg by mouth daily as needed (cramps).   11/03/2020    Assessment: 45 year old male s/p aortic dissection repair with neurological complications noted. Patient now noted to have cellulitis vs  thrombophlebitis and now ultrasound noted superficial vein thrombosis. New orders to start IV heparin for SVT until further workup can be done.   Patient noted to have petechial hemorrhage this am and heparin stopped. Neurology consulted and feels ok to continue anticoagulation. Will restart at previous rate.   HL subtherapeutic at <0.1. Current rate at 1400 units/hr.   Goal of Therapy:  Heparin level 0.3-0.5 units/ml (low goal d/t prior bleeding) Monitor platelets by anticoagulation protocol: Yes   Plan:  Increase heparin by 2 units/kg/hr (150 units/hr) to 1550 units/hr. Re-check heparin level in 6-8 hours  47 PharmD., BCPS Clinical Pharmacist 11/13/2020 10:13 AM

## 2020-11-13 NOTE — Progress Notes (Signed)
SLP Cancellation Note  Patient Details Name: Jesse [name] MRN: [id number] DOB: June 03, 1975   Cancelled treatment:       Reason Eval/Treat Not Completed: Fatigue/lethargy limiting ability to participate (Pt was approached for treatment. He opened his eyes briefly with verbal and tactile stimulation, but he was unable to maintain  and adequate level of alertness to particpate in treatment. Per nursing, pt did not sleep well last night and required sedating meds overnight. SLP will follow up on subsequent date, unless contacted by staff sooner due to improvement in status.)  Sandar Krinke I. Vear Clock, MS, CCC-SLP Acute Rehabilitation Services Office number [phone] Pager [phone]  Scheryl Marten 11/13/2020, 9:56 AM

## 2020-11-13 NOTE — Progress Notes (Signed)
PMR Admission Coordinator Pre-Admission Assessment   Patient: Jesse [name] is an 45 y.o., male MRN: [id number] DOB: 1975-06-20 Height: _0  (188 cm) Weight: 57.9 kg   Insurance Information HMO:     PPO:      PCP:      IPA:      80/20:      OTHER: PRIMARY: Uninsured. Reviewed cost of CIR with spouse on 7/18.       Policy#:       Subscriber: CM Name:       Phone#:      Fax#: Pre-Cert#:       Employer: Benefits:  Phone #:      Name: Eff. Date:      Deduct:       Out of Pocket Max:       Life Max: CIR:      SNF: Outpatient:      Co-Pay: Home Health:       Co-Pay: DME:      Co-Pay: Providers:  SECONDARY:       Policy#:      Phone#:   Development worker, community:       Phone#:   The Engineer, petroleum" for patients in Inpatient Rehabilitation Facilities with attached "Privacy Act [place] Records" was provided and verbally reviewed with: N/A   Emergency Contact Information Contact Information       Name Relation Home Work Mobile    Edith Endave Spouse     Secretary J Father [phone]               Current Medical History  Patient Admitting Diagnosis: R MCA s/p aortic dissection  History of Present Illness: Jesse [name] is a 45 year old right-handed male with history of anxiety/depression with longstanding tobacco alcohol use.  Presented 11/05/2020 with acute onset of aphasia and blurred vision as well as altered mental status.  Admission chemistries unremarkable except sodium 133 glucose 112 creatinine 1.32, alcohol negative, WBC 12,700.  Cranial CT scan negative for acute changes.  Question subtle small loss of gray-white differentiation in the right insular region.  CT angiogram of chest as well as head and neck showed partially imaged type of a aortic dissection involving the ascending aorta and transverse portion but not the descending aorta.  Dissection involves innominate artery with thrombosis of the false lumen.  This extended into the  right common carotid with narrowing of the true lumen to a minimal diameter of 1.5 mm.  No proximal intracranial vessel occlusion.  There was noted occlusion of small distal right M2 and proximal M3 MCA branch.  Echocardiogram with ejection fraction of 55 to 60%.  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 Roxy Manns.  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  Mild mass-effect including trace leftward midline shift and again repeated 11/12/2020 for comparison showing interval development of petechial hemorrhage associated with evolving posterior right MCA and MCA/PCA watershed territory infarct no mass occupying hemorrhagic transformation. There was a superficial vein thrombosis left arm noted and intravenous heparin was initiated 11/12/2020 as well as remaining on low-dose aspirin.  EEG completed showing no seizure activity.  Hospital course patient did require intubation followed by pulmonary services.  Patient currently remains n.p.o. with alternative means of nutritional support.  Hospital course complicated by severe agitation delirium  and impulsiveness placed on  Seroquel and valproic acid.  Therapy evaluations completed due to patient decreased functional ability altered mental status was recommended for a comprehensive rehab program.   Complete NIHSS TOTAL: 11   Patient's medical record from [facility] has been reviewed by the rehabilitation admission coordinator and physician.   Past Medical History      Past Medical History:  Diagnosis Date   Anxiety     Depression     Essential hypertension     S/P aortic dissection repair 11/05/2020    Straight graft replacement of ascending aorta and proximal transverse aortic arch with re-suspension of native  aortic valve and open hemi arch distal anastomosis with aorta to right common carotid bypass and aorta to right subclavian bypass      Family History   family history is not on file.   Prior Rehab/Hospitalizations Has the patient had prior rehab or hospitalizations prior to admission? No   Has the patient had major surgery during 100 days prior to admission? Yes              Current Medications   Current Facility-Administered Medications:   0.9 %  sodium chloride infusion, 250 mL, Intravenous, Continuous, Ogan, Kerry Kass, MD, Stopped at 11/08/20 0850   amLODipine (NORVASC) tablet 2.5 mg, 2.5 mg, Per Tube, Daily, Einar Grad, RPH, 2.5 mg at 11/13/20 1021   aspirin chewable tablet 81 mg, 81 mg, Per Tube, Daily, Julian Hy, DO, 81 mg at 11/13/20 1021   bisacodyl (DULCOLAX) EC tablet 10 mg, 10 mg, Oral, Daily, 10 mg at 11/12/20 0956 **OR** bisacodyl (DULCOLAX) suppository 10 mg, 10 mg, Rectal, Daily, Rexene Alberts, MD, 10 mg at 11/06/20 1058   buPROPion [facility]) tablet 75 mg, 75 mg, Per Tube, BID, Lyndee Leo, RPH, 75 mg at 11/13/20 1022   chlorhexidine (PERIDEX) 0.12 % solution 15 mL, 15 mL, Mouth Rinse, BID, Rexene Alberts, MD, 15 mL at 11/13/20 1022   Chlorhexidine Gluconate Cloth 2 % PADS 6 each, 6 each, Topical, Daily, Rexene Alberts, MD, 6 each at 11/12/20 0957   clonazePAM (KLONOPIN) disintegrating tablet 2 mg, 2 mg, Per Tube, Q8H, Julian Hy, DO, 2 mg at 11/13/20 0539   docusate (COLACE) 50 MG/5ML liquid 100 mg, 100 mg, Per Tube, BID, Julian Hy, DO, 100 mg at 11/12/20 0955   feeding supplement (OSMOLITE 1.5 CAL) liquid 1,000 mL, 1,000 mL, Per Tube, Continuous, Julian Hy, DO, Last Rate: 60 mL/hr at 11/12/20 1059, 1,000 mL at 11/12/20 1059   feeding supplement (PROSource TF) liquid 45 mL, 45 mL, Per Tube, TID, Candee Furbish, MD, 45 mL at 11/13/20 1020   fentaNYL (SUBLIMAZE) bolus via infusion 50-100 mcg, 50-100 mcg, Intravenous, Q15 min PRN,  Corey Harold, NP   folic acid (FOLVITE) tablet 1 mg, 1 mg, Per Tube, Daily, Noemi Chapel P, DO, 1 mg at 11/13/20 1022   free water 100 mL, 100 mL, Per Tube, Q8H, Agarwala, Ravi, MD, 100 mL at 11/13/20 0550   heparin ADULT infusion 100 units/mL (25000 units/269m), 1,550 Units/hr, Intravenous, Continuous, CJulian Hy DO, Last Rate: 14 mL/hr at 11/13/20 0900, 1,400 Units/hr at 11/13/20 0900   hydrALAZINE (APRESOLINE) injection 10 mg, 10 mg, Intravenous, Q6H PRN, Roddenberry, Myron G, PA-C, 10 mg at 11/12/20 0432   labetalol (NORMODYNE) injection 10 mg, 10 mg, Intravenous, Q1H PRN, AKipp Brood MD, 10 mg at 11/12/20 0714   labetalol (NORMODYNE) tablet 200 mg,  200 mg, Per Tube, TID, Noemi Chapel P, DO, 200 mg at 11/13/20 1021   LORazepam (ATIVAN) injection 1-2 mg, 1-2 mg, Intravenous, Q2H PRN, Anders Simmonds, MD, 2 mg at 11/13/20 0604   MEDLINE mouth rinse, 15 mL, Mouth Rinse, BID, Agarwala, Ravi, MD, 15 mL at 11/13/20 1023   melatonin tablet 5 mg, 5 mg, Per Tube, QHS PRN, Anders Simmonds, MD, 5 mg at 11/12/20 2258   multivitamin liquid 15 mL, 15 mL, Per Tube, Daily, Noemi Chapel P, DO, 15 mL at 11/13/20 1021   polyethylene glycol (MIRALAX / GLYCOLAX) packet 17 g, 17 g, Per Tube, Daily, Noemi Chapel P, DO, 17 g at 11/12/20 0955   [START ON 11/14/2020] predniSONE 5 MG/5ML solution 10 mg, 10 mg, Per Tube, Q breakfast, Carlis Abbott, Laura P, DO   QUEtiapine (SEROQUEL) tablet 25 mg, 25 mg, Per Tube, BID, Julian Hy, DO, 25 mg at 11/13/20 1022   sodium chloride flush (NS) 0.9 % injection 3 mL, 3 mL, Intravenous, PRN, Rexene Alberts, MD, 3 mL at 11/10/20 2058   thiamine tablet 100 mg, 100 mg, Per Tube, Daily, Julian Hy, DO, 100 mg at 11/13/20 1021   valproic acid (DEPAKENE) 250 MG/5ML solution 250 mg, 250 mg, Per Tube, TID, Kipp Brood, MD, 250 mg at 11/13/20 1021   Patients Current Diet:  Diet Order                  Diet NPO time specified  Diet effective now                          Precautions / Restrictions Precautions Precautions: Fall, Other (comment), Sternal Precaution Comments: L inattention, cortrak Restrictions Weight Bearing Restrictions: Yes RUE Weight Bearing:  (sternal precautions) LUE Weight Bearing:  (sternal precautions) Other Position/Activity Restrictions: sternal    Has the patient had 2 or more falls or a fall with injury in the past year? No   Prior Activity Level Community (5-7x/wk): active prior to admit, no DME used, driving, has 3 children   Prior Functional Level Self Care: Did the patient need help bathing, dressing, using the toilet or eating? Independent   Indoor Mobility: Did the patient need assistance with walking from room to room (with or without device)? Independent   Stairs: Did the patient need assistance with internal or external stairs (with or without device)? Independent   Functional Cognition: Did the patient need help planning regular tasks such as shopping or remembering to take medications? Independent   Home Assistive Devices / Equipment Home Assistive Devices/Equipment: None   Prior Device Use: Indicate devices/aids used by the patient prior to current illness, exacerbation or injury? None of the above   Current Functional Level Cognition   Overall Cognitive Status: Difficult to assess Difficult to assess due to: Impaired communication Current Attention Level: Focused Orientation Level: Oriented to person, Disoriented to place, Disoriented to time, Disoriented to situation Following Commands: Follows one step commands inconsistently, Follows one step commands with increased time General Comments: following only a few simple commands, poor awareness. limited verbalizations during session    Extremity Assessment (includes Sensation/Coordination)   Upper Extremity Assessment: RUE deficits/detail, LUE deficits/detail, Difficult to assess due to impaired cognition RUE Deficits / Details: able to hold R UE  up against gravity, but difficult to assess due to cognition LUE Deficits / Details: able to squeeze L hand when asking to "hold my hand", does not withdrawl to  painful stimuli, L inattention LUE Sensation: decreased light touch, decreased proprioception LUE Coordination: decreased fine motor, decreased gross motor  Lower Extremity Assessment: Defer to PT evaluation RLE Deficits / Details: pt raises both LEs against gravity at bed level, stands without note of knee buckling at edge of bed, minG for standing balance after PT initiation LLE Deficits / Details: pt raises both LEs against gravity at bed level, stands without note of knee buckling at edge of bed, minG for standing balance after PT initiation     ADLs   Overall ADL's : Needs assistance/impaired Eating/Feeding: NPO Grooming: Total assistance, Standing, Sitting, Wash/dry face, Brushing hair Grooming Details (indicate cue type and reason): EOB R hand with hand over hand support total assist Lower Body Dressing: Total assistance, +2 for safety/equipment, +2 for physical assistance, Sit to/from stand Functional mobility during ADLs: Maximal assistance, +2 for physical assistance, +2 for safety/equipment, Cueing for safety, Cueing for sequencing General ADL Comments: total assist for all self care at this time     Mobility   Overal bed mobility: Needs Assistance Bed Mobility: Supine to Sit, Sit to Supine Supine to sit: +2 for physical assistance, +2 for safety/equipment, Total assist Sit to supine: Max assist, +2 for safety/equipment, +2 for physical assistance General bed mobility comments: total assist +2 to EOB due to lethargy, max assist to return supine     Transfers   Overall transfer level: Needs assistance Equipment used: 2 person hand held assist Transfers: Sit to/from Stand Sit to Stand: Mod assist, +2 safety/equipment Stand pivot transfers: Mod assist, +2 safety/equipment General transfer comment: Mod assist to initiate  and then +2 min assist to complete stand, support to ensure not pushing with UEs     Ambulation / Gait / Stairs / Wheelchair Mobility   Ambulation/Gait Ambulation/Gait assistance: Mod assist, +2 physical assistance Gait Distance (Feet): 3 Feet (forward and back) Assistive device: 1 person hand held assist, 2 person hand held assist Gait Pattern/deviations: Step-to pattern, Decreased step length - right, Decreased step length - left, Decreased stride length General Gait Details: uncoordinated, staggered steps with scissoring and overall instability Gait velocity interpretation: <1.31 ft/sec, indicative of household ambulator     Posture / Balance Dynamic Sitting Balance Sitting balance - Comments: heavy foward lean at times, brief bouts of sitting upright but unable to sustain Balance Overall balance assessment: Needs assistance Sitting-balance support: No upper extremity supported, Feet supported Sitting balance-Leahy Scale: Poor Sitting balance - Comments: heavy foward lean at times, brief bouts of sitting upright but unable to sustain Postural control: Other (comment) (foward lean) Standing balance support: Bilateral upper extremity supported, During functional activity Standing balance-Leahy Scale: Poor Standing balance comment: +2 assist for static standing     Special needs/care consideration Behavioral consideration agitation/resltess, and Designated visitor wife, Felicita Gage, to stay the night for agitation/safety    Previous Home Environment (from acute therapy documentation) Living Arrangements: Spouse/significant other, Children Available Help at Discharge: Family, Available 24 hours/day Type of Home: House Home Layout: Able to live on main level with bedroom/bathroom Home Access: Stairs to enter Entrance Stairs-Rails: None (can add one if needed) Entrance Stairs-Number of Steps: 2 Bathroom Shower/Tub: Chiropodist: Standard Bathroom Accessibility: Yes How  Accessible: Accessible via walker Home Care Services: No Additional Comments: pt with aphasia and still on some sedation, PT/OT unable to obtain history   Discharge Living Setting Plans for Discharge Living Setting: Patient's home, Lives with (comment) (spouse and 3 children) Type of Home at Discharge:  House Discharge Home Layout: Able to live on main level with bedroom/bathroom Discharge Home Access: Stairs to enter Entrance Stairs-Rails: None Entrance Stairs-Number of Steps: 2 Discharge Bathroom Shower/Tub: Tub/shower unit Discharge Bathroom Toilet: Standard Discharge Bathroom Accessibility: Yes How Accessible: Accessible via walker Does the patient have any problems obtaining your medications?: Yes (Describe) (uninsured)   Social/Family/Support Systems Patient Roles: Spouse, Parent Contact Information: children ages 52, 60, and 104 Anticipated Caregiver: spouse, Felicita Gage Anticipated Caregiver's Contact Information: [phone] Ability/Limitations of Caregiver: n/a Caregiver Availability: 24/7 Discharge Plan Discussed with Primary Caregiver: Yes Is Caregiver In Agreement with Plan?: Yes Does Caregiver/Family have Issues with Lodging/Transportation while Pt is in Rehab?: No   Goals Patient/Family Goal for Rehab: PT/OT min assist, SLP, min/mod assist Expected length of stay: 24-28 days Additional Information: Pt has been agitated/restless on acute, requiring BUE restraints and mittens. Pt/Family Agrees to Admission and willing to participate: Yes Program Orientation Provided & Reviewed with Pt/Caregiver Including Roles  & Responsibilities: Yes Additional Information Needs: Plan to bring to CIR to address behavior and begin to mobilize more  Barriers to Discharge: Insurance for SNF coverage, Home environment access/layout   Decrease burden of Care through IP rehab admission: n/a   Possible need for SNF placement upon discharge: Not anticipated. Reviewed need to d/c home with family  support given lack of payor source.    Patient Condition: I have reviewed medical records from [facility], spoken with CM, and patient and spouse. I met with patient at the bedside and discussed via phone for inpatient rehabilitation assessment.  Patient will benefit from ongoing PT, OT, and SLP, can actively participate in 3 hours of therapy a day 5 days of the week, and can make measurable gains during the admission.  Patient will also benefit from the coordinated team approach during an Inpatient Acute Rehabilitation admission.  The patient will receive intensive therapy as well as Rehabilitation physician, nursing, social worker, and care management interventions.  Due to bladder management, bowel management, safety, skin/wound care, disease management, medication administration, pain management, and patient education the patient requires 24 hour a day rehabilitation nursing.  The patient is currently mod to max +2 with mobility and basic ADLs.  Discharge setting and therapy post discharge at home with home health is anticipated.  Patient has agreed to participate in the Acute Inpatient Rehabilitation Program and will admit today.   Preadmission Screen Completed By:  Michel Santee, PT, DPT 11/13/2020 11:08 AM ______________________________________________________________________   Discussed status with Dr. Dagoberto Ligas on 11/13/20  at 11:14 AM  and received approval for admission today.   Admission Coordinator:  Michel Santee, PT, DPT time 11:14 AM Sudie Grumbling  11/13/20     Assessment/Plan: Diagnosis: Does the need for close, 24 hr/day Medical supervision in concert with the patient's rehab needs make it unreasonable for this patient to be served in a less intensive setting? 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 Yes Does the patient require coordinated care of a physician, rehab nurse, PT, OT, and SLP to address physical and functional deficits in the context of the above medical diagnosis(es)? Yes Addressing deficits in the following areas: balance, endurance, locomotion, strength, transferring, bowel/bladder control, bathing, dressing, feeding, grooming, toileting, cognition, speech, language, and swallowing Can the patient actively participate in  an intensive therapy program of at least 3 hrs of therapy 5 days a week? Yes The potential for patient to make measurable gains while on inpatient rehab is good and fair Anticipated functional outcomes upon discharge from inpatient rehab: min assist PT, min assist OT, min assist and mod assist SLP Estimated rehab length of stay to reach the above functional goals is: 24-28 days Anticipated discharge destination: Home 10. Overall Rehab/Functional Prognosis: good and fair     MD Signature:

## 2020-11-13 NOTE — Progress Notes (Signed)
eLink Physician-Brief Progress Note Patient Name: Jesse [name] DOB: [birth year] MRN: [id number]   Date of Service  11/13/2020  HPI/Events of Note  Patient with severe agitated delirium and impulsiveness, trying to get out of bed, denies pain anywhere when questioned by his spouse.  eICU Interventions  Zyprexa 10 mg IM x 1 now, will give an extra dose of Valproic acid 250 mg po now. Wife has agreed to spend the night by his side as there are no sitters available.        Jesse [name] 11/13/2020, 3:21 AM

## 2020-11-13 NOTE — Progress Notes (Addendum)
Subjective:  He is restrained and confused   Antibiotics:  Anti-infectives (From admission, onward)    Start     Dose/Rate Route Frequency Ordered Stop   11/11/20 2200  vancomycin (VANCOREADY) IVPB 1250 mg/250 mL  Status:  Discontinued        1,250 mg 166.7 mL/hr over 90 Minutes Intravenous Every 12 hours 11/11/20 1211 11/12/20 0917   11/11/20 1315  cephALEXin (KEFLEX) 250 MG/5ML suspension 500 mg  Status:  Discontinued        500 mg Per Tube Every 6 hours 11/11/20 1220 11/12/20 0910   11/11/20 1300  vancomycin (VANCOREADY) IVPB 1500 mg/300 mL        1,500 mg 150 mL/hr over 120 Minutes Intravenous  Once 11/11/20 1211 11/11/20 1526   11/11/20 1200  cephALEXin (KEFLEX) capsule 500 mg  Status:  Discontinued        500 mg Per Tube Every 6 hours 11/11/20 1005 11/11/20 1039   11/11/20 1200  cephALEXin (KEFLEX) 250 MG/5ML suspension 500 mg  Status:  Discontinued        500 mg Per Tube Every 6 hours 11/11/20 1039 11/11/20 1207   11/05/20 1130  vancomycin (VANCOCIN) IVPB 1000 mg/200 mL premix        1,000 mg 200 mL/hr over 60 Minutes Intravenous  Once 11/05/20 0325 11/05/20 1118   11/05/20 0730  ceFAZolin (ANCEF) IVPB 2g/100 mL premix        2 g 200 mL/hr over 30 Minutes Intravenous Every 8 hours 11/05/20 0325 11/06/20 2220   11/05/20 0058  vancomycin (VANCOCIN) 1,000 mg in sodium chloride 0.9 % 1,000 mL irrigation  Status:  Discontinued          As needed 11/05/20 0058 11/05/20 0322   11/04/20 2000  vancomycin (VANCOREADY) IVPB 1250 mg/250 mL        1,250 mg 166.7 mL/hr over 90 Minutes Intravenous To Surgery 11/04/20 1943 11/04/20 2028   11/04/20 2000  ceFAZolin (ANCEF) IVPB 2g/100 mL premix        2 g 200 mL/hr over 30 Minutes Intravenous To Surgery 11/04/20 1943 11/05/20 0212   11/04/20 2000  ceFAZolin (ANCEF) IVPB 2g/100 mL premix  Status:  Discontinued        2 g 200 mL/hr over 30 Minutes Intravenous To Surgery 11/04/20 1943 11/05/20 0325   11/04/20 2000  vancomycin  (VANCOCIN) 1,000 mg in sodium chloride 0.9 % 1,000 mL irrigation  Status:  Discontinued         Irrigation To Surgery 11/04/20 1948 11/05/20 0325       Medications: Scheduled Meds:  amLODipine  2.5 mg Per Tube Daily   aspirin  81 mg Per Tube Daily   bisacodyl  10 mg Oral Daily   Or   bisacodyl  10 mg Rectal Daily   buPROPion  75 mg Per Tube BID   chlorhexidine  15 mL Mouth Rinse BID   Chlorhexidine Gluconate Cloth  6 each Topical Daily   clonazepam  2 mg Per Tube Q8H   docusate  100 mg Per Tube BID   feeding supplement (PROSource TF)  45 mL Per Tube TID   folic acid  1 mg Per Tube Daily   free water  100 mL Per Tube Q8H   labetalol  200 mg Per Tube TID   mouth rinse  15 mL Mouth Rinse BID   multivitamin  15 mL Per Tube Daily   polyethylene glycol  17 g  Per Tube Daily   [START ON 11/14/2020] predniSONE  10 mg Per Tube Q breakfast   QUEtiapine  25 mg Per Tube BID   thiamine  100 mg Per Tube Daily   valproic acid  250 mg Per Tube TID   Continuous Infusions:  sodium chloride Stopped (11/08/20 0850)   feeding supplement (OSMOLITE 1.5 CAL) 1,000 mL (11/12/20 1059)   heparin 1,550 Units/hr (11/13/20 1201)   PRN Meds:.fentaNYL, hydrALAZINE, labetalol, LORazepam, melatonin, sodium chloride flush    Objective: Weight change: -4.4 kg  Intake/Output Summary (Last 24 hours) at 11/13/2020 1256 Last data filed at 11/13/2020 1201 Gross per 24 hour  Intake 3184.49 ml  Output 3450 ml  Net -265.51 ml   Blood pressure 130/80, pulse 71, temperature 98.9 F (37.2 C), temperature source Oral, resp. rate 18, height 6\' 2"  (1.88 m), weight 57.9 kg, SpO2 99 %. Temp:  [97.5 F (36.4 C)-98.9 F (37.2 C)] 98.9 F (37.2 C) (07/21 1131) Pulse Rate:  [37-145] 71 (07/21 1200) Resp:  [15-23] 18 (07/21 1200) BP: (107-170)/(72-100) 130/80 (07/21 1100) SpO2:  [77 %-100 %] 99 % (07/21 1200) Weight:  [57.9 kg] 57.9 kg (07/21 0500)  Physical Exam: Physical Exam Constitutional:      Appearance: He  is underweight.     Interventions: He is restrained.  HENT:     Head: Normocephalic and atraumatic.  Cardiovascular:     Rate and Rhythm: Regular rhythm. Tachycardia present.     Heart sounds: No murmur heard.   No friction rub. No gallop.  Pulmonary:     Effort: No respiratory distress.     Breath sounds: Rhonchi present. No wheezing.  Skin:    General: Skin is warm.  Neurological:     Mental Status: He is lethargic.  Psychiatric:        Cognition and Memory: Cognition is impaired. Memory is impaired. He exhibits impaired recent memory and impaired remote memory.    Left upper extremity site of phlebitis does not have erythema anymore and swelling seems to going down  There is a little area on his left buttocks that has a little bit of erythema but does not appear cellulitic at all.   CBC:    BMET Recent Labs    11/12/20 0422 11/13/20 0041  NA 135 136  K 4.5 4.3  CL 100 101  CO2 27 26  GLUCOSE 126* 136*  BUN 17 21*  CREATININE 0.99 1.01  CALCIUM 8.7* 8.8*     Liver Panel  No results for input(s): PROT, ALBUMIN, AST, ALT, ALKPHOS, BILITOT, BILIDIR, IBILI in the last 72 hours.     Sedimentation Rate No results for input(s): ESRSEDRATE in the last 72 hours. C-Reactive Protein No results for input(s): CRP in the last 72 hours.  Micro Results: Recent Results (from the past 720 hour(s))  Resp Panel by RT-PCR (Flu A&B, Covid) Nasopharyngeal Swab     Status: None   Collection Time: 11/04/20  7:34 PM   Specimen: Nasopharyngeal Swab; Nasopharyngeal(NP) swabs in vial transport medium  Result Value Ref Range Status   SARS Coronavirus 2 by RT PCR NEGATIVE NEGATIVE Final    Comment: (NOTE) SARS-CoV-2 target nucleic acids are NOT DETECTED.  The SARS-CoV-2 RNA is generally detectable in upper respiratory specimens during the acute phase of infection. The lowest concentration of SARS-CoV-2 viral copies this assay can detect is 138 copies/mL. A negative result does  not preclude SARS-Cov-2 infection and should not be used as the sole basis for treatment  or other patient management decisions. A negative result may occur with  improper specimen collection/handling, submission of specimen other than nasopharyngeal swab, presence of viral mutation(s) within the areas targeted by this assay, and inadequate number of viral copies(<138 copies/mL). A negative result must be combined with clinical observations, patient history, and epidemiological information. The expected result is Negative.  Fact Sheet for Patients:  BloggerCourse.com  Fact Sheet for Healthcare Providers:  SeriousBroker.it  This test is no t yet approved or cleared by the [place] FDA and  has been authorized for detection and/or diagnosis of SARS-CoV-2 by FDA under an Emergency Use Authorization (EUA). This EUA will remain  in effect (meaning this test can be used) for the duration of the COVID-19 declaration under Section 564(b)(1) of the Act, 21 U.S.C.section 360bbb-3(b)(1), unless the authorization is terminated  or revoked sooner.       Influenza A by PCR NEGATIVE NEGATIVE Final   Influenza B by PCR NEGATIVE NEGATIVE Final    Comment: (NOTE) The Xpert Xpress SARS-CoV-2/FLU/RSV plus assay is intended as an aid in the diagnosis of influenza from Nasopharyngeal swab specimens and should not be used as a sole basis for treatment. Nasal washings and aspirates are unacceptable for Xpert Xpress SARS-CoV-2/FLU/RSV testing.  Fact Sheet for Patients: BloggerCourse.com  Fact Sheet for Healthcare Providers: SeriousBroker.it  This test is not yet approved or cleared by the [place] FDA and has been authorized for detection and/or diagnosis of SARS-CoV-2 by FDA under an Emergency Use Authorization (EUA). This EUA will remain in effect (meaning this test can be used) for the  duration of the COVID-19 declaration under Section 564(b)(1) of the Act, 21 U.S.C. section 360bbb-3(b)(1), unless the authorization is terminated or revoked.  Performed at [facility], [address], [place], [place] [address]   Culture, Respiratory w Gram Stain     Status: None   Collection Time: 11/08/20  8:45 AM   Specimen: Tracheal Aspirate; Respiratory  Result Value Ref Range Status   Specimen Description TRACHEAL ASPIRATE  Final   Special Requests NONE  Final   Gram Stain   Final    ABUNDANT WBC PRESENT,BOTH PMN AND MONONUCLEAR RARE YEAST Performed at [facility], [address] [place], [place] [address]    Culture RARE CANDIDA ALBICANS  Final   Report Status 11/10/2020 FINAL  Final  Culture, blood (routine x 2)     Status: None (Preliminary result)   Collection Time: 11/11/20 11:00 AM   Specimen: BLOOD LEFT ARM  Result Value Ref Range Status   Specimen Description BLOOD LEFT ARM  Final   Special Requests   Final    BOTTLES DRAWN AEROBIC AND ANAEROBIC Blood Culture results may not be optimal due to an inadequate volume of blood received in culture bottles   Culture   Final    NO GROWTH < 24 HOURS Performed at [facility], [address] [place], [place] [address]    Report Status PENDING  Incomplete  Culture, blood (routine x 2)     Status: None (Preliminary result)   Collection Time: 11/11/20 11:06 AM   Specimen: BLOOD  Result Value Ref Range Status   Specimen Description BLOOD LEFT ANTECUBITAL  Final   Special Requests   Final    BOTTLES DRAWN AEROBIC ONLY Blood Culture results may not be optimal due to an inadequate volume of blood received in culture bottles   Culture   Final    NO GROWTH <  24 HOURS Performed at [facility], [address], [place], [place] [address]    Report Status PENDING  Incomplete    Studies/Results: CT HEAD WO CONTRAST  Result Date: 11/12/2020 CLINICAL DATA:  Neuro deficit, acute stroke suspected.  EXAM: CT HEAD WITHOUT CONTRAST TECHNIQUE: Contiguous axial images were obtained from the base of the skull through the vertex without intravenous contrast. COMPARISON:  CT head 11/07/2019. 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 Interval development faint curvilinear gyral hyperdensity in this region, compatible with petechial hemorrhage. No mass occupying hemorrhagic transformation. Mass effect is mildly progressed with approximately 4 mm of leftward midline shift at the foramen of Monro. No evidence of new/interval acute large vascular territory infarct. No hydrocephalus. Basal cisterns are patent. No extra-axial fluid collection. Vascular: Better evaluated on prior MRA. Skull: No acute fracture. Sinuses/Orbits: Polypoid mucosal thickening in the right sphenoid sinus and mild ethmoid air cell mucosal thickening. No acute orbital findings. Other: No mastoid effusions. 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 No mass occupying hemorrhagic transformation. Cytotoxic edema and mass effect is mildly progressed with approximately 4 mm of leftward midline shift at the foramen of Monro (previously 3 mm when remeasured). Electronically Signed   By: Feliberto Harts MD   On: 11/12/2020 08:02   DG Chest Port 1 View  Result Date: 11/13/2020 CLINICAL DATA:  Shortness of breath. Recent surgery for aortic dissection. EXAM: PORTABLE CHEST 1 VIEW COMPARISON:  Chest x-ray 11/09/2020. FINDINGS: Left costophrenic angle incompletely imaged. Feeding tube is been advanced, tip below left hemidiaphragm. Cardiomegaly again noted. Mild bilateral interstitial prominence suggesting mild interstitial edema. Small right pleural effusion cannot be excluded. No pneumothorax. Surgical clips over the  upper chest. IMPRESSION: 1. Interim advancement of feeding tube, tip below left hemidiaphragm. 2. Cardiomegaly again noted. Mild bilateral interstitial prominence suggesting mild interstitial edema. Small right pleural effusion cannot be excluded. Electronically Signed   By: Maisie Fus  Register   On: 11/13/2020 07:29   [place] LT UPPER EXTREM LTD SOFT TISSUE NON VASCULAR  Result Date: 11/13/2020 CLINICAL DATA:  Pain and swelling in the antecubital fossa. Evaluate for possible abscess. EXAM: ULTRASOUND left UPPER EXTREMITY LIMITED TECHNIQUE: Ultrasound examination of the upper extremity soft tissues was performed in the area of clinical concern. COMPARISON:  None. FINDINGS: There is moderate inflammation/edema in the antecubital fossa but no discrete fluid collection to suggest a drainable abscess. IMPRESSION: Inflammatory changes with moderate edema in the soft tissues but no discrete abscess. Electronically Signed   By: Rudie Meyer M.D.   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 male with subs abuse status post graft placement and subclavian right carotid bypass who developed some left-sided phlebitis.  He was on Keflex and was given also dose of vancomycin.  #  1 Postoperative fever with phlebitis  Doppler did prove that he has phlebitis but no deep clot.  He has been off antibiotics for 24 hours and not had evidence of infection in terms of fevers  or other symptoms to suggest infection.  Would continue to monitor him off antibiotics.  #2  Vitas continue warm compresses as needed  3.  Encephalopathy likely multifactorial but withdrawal could certainly be playing a role.  #5  Polysubstance abuse: We will check hepatitis panel  We will sign off for now please call if further questions.   LOS: 8 days   Acey LavCornelius Van Dam 11/13/2020, 12:56 PM

## 2020-11-13 NOTE — Progress Notes (Signed)
Hypoglycemic Event  CBG:[id number]   Treatment: 4 oz juice/soda given via cortrak  Symptoms: None  Follow-up CBG: [id number] CBG Result:70  Possible Reasons for Event: Unknown  Comments/MD notified:will notify this morning with rounds Dr. Everardo Pacific, Sabino Niemann

## 2020-11-13 NOTE — Progress Notes (Signed)
ANTICOAGULATION CONSULT NOTE - Follow Up Consult  Pharmacy Consult for heparin Indication:  superficial vein thrombosis in setting of stroke and small petechial hemorrhage   Labs: Recent Labs    11/10/20 0603 11/11/20 1059 11/11/20 1914 11/12/20 0422 11/13/20 0041  HGB 8.7*  --   --  10.3* 10.3*  HCT 27.5*  --   --  31.5* 31.2*  PLT 138*  --   --  191 189  HEPARINUNFRC  --   --  0.11* <0.10* <0.10*  CREATININE 0.93 0.89  --  0.99 1.01    Assessment: 44yo male subtherapeutic on heparin after resumed; no gtt issues or signs of bleeding per RN.  Goal of Therapy:  Heparin level 0.3-0.5 units/ml   Plan:  Will increase heparin gtt by 3 units/kg/hr to 1400 units/hr and check level in 6 hours.    Vernard Gambles, PharmD, BCPS  11/13/2020,2:46 AM

## 2020-11-14 ENCOUNTER — Inpatient Hospital Stay (HOSPITAL_COMMUNITY): Payer: Medicaid Other

## 2020-11-14 ENCOUNTER — Inpatient Hospital Stay (HOSPITAL_COMMUNITY)
Admission: AD | Admit: 2020-11-14 | Discharge: 2020-11-27 | DRG: 208 | Disposition: A | Discharge status: Rehabilitation Facility | Payer: Medicaid Other | Source: Intra-hospital | Attending: Internal Medicine | Admitting: Internal Medicine

## 2020-11-14 DIAGNOSIS — J9601 Acute respiratory failure with hypoxia: Secondary | ICD-10-CM | POA: Diagnosis present

## 2020-11-14 DIAGNOSIS — E43 Unspecified severe protein-calorie malnutrition: Secondary | ICD-10-CM | POA: Diagnosis not present

## 2020-11-14 DIAGNOSIS — I314 Cardiac tamponade: Secondary | ICD-10-CM | POA: Diagnosis not present

## 2020-11-14 DIAGNOSIS — I69398 Other sequelae of cerebral infarction: Secondary | ICD-10-CM

## 2020-11-14 DIAGNOSIS — Z4682 Encounter for fitting and adjustment of non-vascular catheter: Secondary | ICD-10-CM | POA: Diagnosis not present

## 2020-11-14 DIAGNOSIS — F17210 Nicotine dependence, cigarettes, uncomplicated: Secondary | ICD-10-CM | POA: Diagnosis present

## 2020-11-14 DIAGNOSIS — R092 Respiratory arrest: Secondary | ICD-10-CM | POA: Diagnosis present

## 2020-11-14 DIAGNOSIS — I69391 Dysphagia following cerebral infarction: Secondary | ICD-10-CM

## 2020-11-14 DIAGNOSIS — F419 Anxiety disorder, unspecified: Secondary | ICD-10-CM | POA: Diagnosis present

## 2020-11-14 DIAGNOSIS — N179 Acute kidney failure, unspecified: Secondary | ICD-10-CM | POA: Diagnosis present

## 2020-11-14 DIAGNOSIS — E870 Hyperosmolality and hypernatremia: Secondary | ICD-10-CM | POA: Diagnosis not present

## 2020-11-14 DIAGNOSIS — R339 Retention of urine, unspecified: Secondary | ICD-10-CM | POA: Diagnosis not present

## 2020-11-14 DIAGNOSIS — D696 Thrombocytopenia, unspecified: Secondary | ICD-10-CM | POA: Diagnosis not present

## 2020-11-14 DIAGNOSIS — Z681 Body mass index (BMI) 19 or less, adult: Secondary | ICD-10-CM | POA: Diagnosis not present

## 2020-11-14 DIAGNOSIS — G9349 Other encephalopathy: Secondary | ICD-10-CM | POA: Diagnosis present

## 2020-11-14 DIAGNOSIS — R748 Abnormal levels of other serum enzymes: Secondary | ICD-10-CM | POA: Diagnosis present

## 2020-11-14 DIAGNOSIS — D509 Iron deficiency anemia, unspecified: Secondary | ICD-10-CM | POA: Diagnosis present

## 2020-11-14 DIAGNOSIS — L89101 Pressure ulcer of unspecified part of back, stage 1: Secondary | ICD-10-CM | POA: Diagnosis present

## 2020-11-14 DIAGNOSIS — Z09 Encounter for follow-up examination after completed treatment for conditions other than malignant neoplasm: Secondary | ICD-10-CM

## 2020-11-14 DIAGNOSIS — I7101 Dissection of thoracic aorta: Secondary | ICD-10-CM | POA: Diagnosis not present

## 2020-11-14 DIAGNOSIS — I517 Cardiomegaly: Secondary | ICD-10-CM | POA: Diagnosis not present

## 2020-11-14 DIAGNOSIS — N1330 Unspecified hydronephrosis: Secondary | ICD-10-CM | POA: Diagnosis not present

## 2020-11-14 DIAGNOSIS — R1312 Dysphagia, oropharyngeal phase: Secondary | ICD-10-CM | POA: Diagnosis present

## 2020-11-14 DIAGNOSIS — J690 Pneumonitis due to inhalation of food and vomit: Secondary | ICD-10-CM | POA: Diagnosis not present

## 2020-11-14 DIAGNOSIS — I63311 Cerebral infarction due to thrombosis of right middle cerebral artery: Secondary | ICD-10-CM | POA: Diagnosis not present

## 2020-11-14 DIAGNOSIS — E875 Hyperkalemia: Secondary | ICD-10-CM | POA: Diagnosis present

## 2020-11-14 DIAGNOSIS — I69320 Aphasia following cerebral infarction: Secondary | ICD-10-CM | POA: Diagnosis not present

## 2020-11-14 DIAGNOSIS — I6310 Cerebral infarction due to embolism of unspecified precerebral artery: Secondary | ICD-10-CM | POA: Diagnosis not present

## 2020-11-14 DIAGNOSIS — I10 Essential (primary) hypertension: Secondary | ICD-10-CM | POA: Diagnosis present

## 2020-11-14 DIAGNOSIS — R2689 Other abnormalities of gait and mobility: Secondary | ICD-10-CM | POA: Diagnosis not present

## 2020-11-14 DIAGNOSIS — I959 Hypotension, unspecified: Secondary | ICD-10-CM | POA: Diagnosis present

## 2020-11-14 DIAGNOSIS — J9811 Atelectasis: Secondary | ICD-10-CM | POA: Diagnosis not present

## 2020-11-14 DIAGNOSIS — L8990 Pressure ulcer of unspecified site, unspecified stage: Secondary | ICD-10-CM | POA: Insufficient documentation

## 2020-11-14 DIAGNOSIS — Z452 Encounter for adjustment and management of vascular access device: Secondary | ICD-10-CM

## 2020-11-14 DIAGNOSIS — D62 Acute posthemorrhagic anemia: Secondary | ICD-10-CM | POA: Diagnosis not present

## 2020-11-14 DIAGNOSIS — Z4659 Encounter for fitting and adjustment of other gastrointestinal appliance and device: Secondary | ICD-10-CM

## 2020-11-14 DIAGNOSIS — E871 Hypo-osmolality and hyponatremia: Secondary | ICD-10-CM | POA: Diagnosis not present

## 2020-11-14 DIAGNOSIS — T424X5A Adverse effect of benzodiazepines, initial encounter: Secondary | ICD-10-CM | POA: Diagnosis present

## 2020-11-14 DIAGNOSIS — I63511 Cerebral infarction due to unspecified occlusion or stenosis of right middle cerebral artery: Secondary | ICD-10-CM | POA: Diagnosis not present

## 2020-11-14 DIAGNOSIS — M47816 Spondylosis without myelopathy or radiculopathy, lumbar region: Secondary | ICD-10-CM | POA: Diagnosis not present

## 2020-11-14 DIAGNOSIS — T50905A Adverse effect of unspecified drugs, medicaments and biological substances, initial encounter: Secondary | ICD-10-CM | POA: Diagnosis present

## 2020-11-14 DIAGNOSIS — F32A Depression, unspecified: Secondary | ICD-10-CM | POA: Diagnosis present

## 2020-11-14 DIAGNOSIS — I63411 Cerebral infarction due to embolism of right middle cerebral artery: Secondary | ICD-10-CM | POA: Diagnosis not present

## 2020-11-14 DIAGNOSIS — Z781 Physical restraint status: Secondary | ICD-10-CM

## 2020-11-14 DIAGNOSIS — Z79899 Other long term (current) drug therapy: Secondary | ICD-10-CM

## 2020-11-14 DIAGNOSIS — U071 COVID-19: Principal | ICD-10-CM | POA: Diagnosis present

## 2020-11-14 DIAGNOSIS — G936 Cerebral edema: Secondary | ICD-10-CM | POA: Diagnosis not present

## 2020-11-14 LAB — POCT I-STAT 7, (LYTES, BLD GAS, ICA,H+H)
Acid-base deficit: 1 mmol/L (ref 0.0–2.0)
Bicarbonate: 22 mmol/L (ref 20.0–28.0)
Calcium, Ion: 0.98 mmol/L — ABNORMAL LOW (ref 1.15–1.40)
HCT: 29 % — ABNORMAL LOW (ref 39.0–52.0)
Hemoglobin: 9.9 g/dL — ABNORMAL LOW (ref 13.0–17.0)
O2 Saturation: 100 %
Patient temperature: 98.29
Potassium: 6.6 mmol/L (ref 3.5–5.1)
Sodium: 135 mmol/L (ref 135–145)
TCO2: 23 mmol/L (ref 22–32)
pCO2 arterial: 31 mmHg — ABNORMAL LOW (ref 32.0–48.0)
pH, Arterial: 7.458 — ABNORMAL HIGH (ref 7.350–7.450)
pO2, Arterial: 162 mmHg — ABNORMAL HIGH (ref 83.0–108.0)

## 2020-11-14 LAB — CBC
HCT: 25.7 % — ABNORMAL LOW (ref 39.0–52.0)
HCT: 26.5 % — ABNORMAL LOW (ref 39.0–52.0)
Hemoglobin: 8.1 g/dL — ABNORMAL LOW (ref 13.0–17.0)
Hemoglobin: 8.5 g/dL — ABNORMAL LOW (ref 13.0–17.0)
MCH: 33.8 pg (ref 26.0–34.0)
MCH: 33.5 pg (ref 26.0–34.0)
MCHC: 31.5 g/dL (ref 30.0–36.0)
MCHC: 32.1 g/dL (ref 30.0–36.0)
MCV: 107.1 fL — ABNORMAL HIGH (ref 80.0–100.0)
MCV: 104.3 fL — ABNORMAL HIGH (ref 80.0–100.0)
Platelets: 149 10*3/uL — ABNORMAL LOW (ref 150–400)
Platelets: 78 10*3/uL — ABNORMAL LOW (ref 150–400)
RBC: 2.4 MIL/uL — ABNORMAL LOW (ref 4.22–5.81)
RBC: 2.54 MIL/uL — ABNORMAL LOW (ref 4.22–5.81)
RDW: 14.2 % (ref 11.5–15.5)
RDW: 14 % (ref 11.5–15.5)
WBC: 10.9 10*3/uL — ABNORMAL HIGH (ref 4.0–10.5)
WBC: 12 10*3/uL — ABNORMAL HIGH (ref 4.0–10.5)
nRBC: 0.6 % — ABNORMAL HIGH (ref 0.0–0.2)
nRBC: 0.3 % — ABNORMAL HIGH (ref 0.0–0.2)

## 2020-11-14 LAB — COMPREHENSIVE METABOLIC PANEL
ALT: 46 U/L — ABNORMAL HIGH (ref 0–44)
AST: 127 U/L — ABNORMAL HIGH (ref 15–41)
Albumin: 2.3 g/dL — ABNORMAL LOW (ref 3.5–5.0)
Alkaline Phosphatase: 77 U/L (ref 38–126)
Anion gap: 9 (ref 5–15)
BUN: 41 mg/dL — ABNORMAL HIGH (ref 6–20)
CO2: 24 mmol/L (ref 22–32)
Calcium: 7.3 mg/dL — ABNORMAL LOW (ref 8.9–10.3)
Chloride: 103 mmol/L (ref 98–111)
Creatinine, Ser: 2.28 mg/dL — ABNORMAL HIGH (ref 0.61–1.24)
GFR, Estimated: 36 mL/min — ABNORMAL LOW (ref >60)
Glucose, Bld: 125 mg/dL — ABNORMAL HIGH (ref 70–99)
Potassium: 6.2 mmol/L — ABNORMAL HIGH (ref 3.5–5.1)
Sodium: 136 mmol/L (ref 135–145)
Total Bilirubin: 0.4 mg/dL (ref 0.3–1.2)
Total Protein: 5.1 g/dL — ABNORMAL LOW (ref 6.5–8.1)

## 2020-11-14 LAB — GLUCOSE, CAPILLARY
Glucose-Capillary: 104 mg/dL — ABNORMAL HIGH (ref 70–99)
Glucose-Capillary: 106 mg/dL — ABNORMAL HIGH (ref 70–99)
Glucose-Capillary: 109 mg/dL — ABNORMAL HIGH (ref 70–99)
Glucose-Capillary: 121 mg/dL — ABNORMAL HIGH (ref 70–99)
Glucose-Capillary: 123 mg/dL — ABNORMAL HIGH (ref 70–99)
Glucose-Capillary: 148 mg/dL — ABNORMAL HIGH (ref 70–99)
Glucose-Capillary: 152 mg/dL — ABNORMAL HIGH (ref 70–99)
Glucose-Capillary: 83 mg/dL (ref 70–99)

## 2020-11-14 LAB — PROTIME-INR
INR: 1.3 — ABNORMAL HIGH (ref 0.8–1.2)
Prothrombin Time: 16.4 seconds — ABNORMAL HIGH (ref 11.4–15.2)

## 2020-11-14 LAB — ECHOCARDIOGRAM LIMITED

## 2020-11-14 LAB — CK: Total CK: 250 U/L (ref 49–397)

## 2020-11-14 LAB — PROCALCITONIN: Procalcitonin: 11.72 ng/mL

## 2020-11-14 LAB — TYPE AND SCREEN
ABO/RH(D): O POS
Antibody Screen: NEGATIVE

## 2020-11-14 LAB — MRSA NEXT GEN BY PCR, NASAL: MRSA by PCR Next Gen: NOT DETECTED

## 2020-11-14 LAB — BRAIN NATRIURETIC PEPTIDE: B Natriuretic Peptide: 353 pg/mL — ABNORMAL HIGH (ref 0.0–100.0)

## 2020-11-14 LAB — POTASSIUM
Potassium: 5.3 mmol/L — ABNORMAL HIGH (ref 3.5–5.1)
Potassium: 5.6 mmol/L — ABNORMAL HIGH (ref 3.5–5.1)
Potassium: 5 mmol/L (ref 3.5–5.1)

## 2020-11-14 LAB — LACTIC ACID, PLASMA: Lactic Acid, Venous: 1.2 mmol/L (ref 0.5–1.9)

## 2020-11-14 LAB — PHOSPHORUS: Phosphorus: 4.9 mg/dL — ABNORMAL HIGH (ref 2.5–4.6)

## 2020-11-14 LAB — HEPARIN LEVEL (UNFRACTIONATED): Heparin Unfractionated: <0.1 IU/mL — ABNORMAL LOW (ref 0.30–0.70)

## 2020-11-14 LAB — TROPONIN I (HIGH SENSITIVITY): Troponin I (High Sensitivity): 332 ng/L (ref <18)

## 2020-11-14 LAB — MAGNESIUM: Magnesium: 2.5 mg/dL — ABNORMAL HIGH (ref 1.7–2.4)

## 2020-11-14 MED ORDER — NOREPINEPHRINE 4 MG/250ML-% IV SOLN
0.0000 ug/min | INTRAVENOUS | Status: DC
  Administered 2020-11-14: 10 ug/min via INTRAVENOUS
  Filled 2020-11-14: qty 250

## 2020-11-14 MED ORDER — SODIUM ZIRCONIUM CYCLOSILICATE 10 G PO PACK
10.0000 g | PACK | Freq: Three times a day (TID) | ORAL | Status: AC
  Administered 2020-11-14 (×3): 10 g
  Filled 2020-11-14 (×3): qty 1

## 2020-11-14 MED ORDER — INSULIN ASPART 100 UNIT/ML IJ SOLN
0.0000 [IU] | INTRAMUSCULAR | Status: DC
  Administered 2020-11-14 (×2): 1 [IU] via SUBCUTANEOUS
  Administered 2020-11-15: 2 [IU] via SUBCUTANEOUS

## 2020-11-14 MED ORDER — INSULIN ASPART 100 UNIT/ML IV SOLN
10.0000 [IU] | Freq: Once | INTRAVENOUS | Status: AC
  Administered 2020-11-14: 10 [IU] via INTRAVENOUS

## 2020-11-14 MED ORDER — DEXTROSE 50 % IV SOLN
INTRAVENOUS | Status: AC
  Filled 2020-11-14: qty 50

## 2020-11-14 MED ORDER — DEXTROSE 50 % IV SOLN
1.0000 | Freq: Once | INTRAVENOUS | Status: AC
  Administered 2020-11-14: 50 mL via INTRAVENOUS
  Filled 2020-11-14: qty 50

## 2020-11-14 MED ORDER — PROPOFOL 1000 MG/100ML IV EMUL: 0.0000 ug/kg/min | INTRAVENOUS | Status: DC

## 2020-11-14 MED ORDER — CHLORHEXIDINE GLUCONATE CLOTH 2 % EX PADS
6.0000 | MEDICATED_PAD | Freq: Every day | CUTANEOUS | Status: DC
  Administered 2020-11-14 – 2020-11-16 (×3): 6 via TOPICAL

## 2020-11-14 MED ORDER — VANCOMYCIN HCL 1250 MG/250ML IV SOLN
1250.0000 mg | Freq: Once | INTRAVENOUS | Status: AC
  Administered 2020-11-14: 1250 mg via INTRAVENOUS
  Filled 2020-11-14: qty 250

## 2020-11-14 MED ORDER — PROSOURCE TF PO LIQD
45.0000 mL | Freq: Three times a day (TID) | ORAL | Status: DC
Start: 2020-11-14 — End: 2020-11-24
  Administered 2020-11-14 – 2020-11-24 (×28): 45 mL
  Filled 2020-11-14 (×32): qty 45

## 2020-11-14 MED ORDER — SODIUM CHLORIDE 0.9 % IV SOLN
2.0000 g | Freq: Three times a day (TID) | INTRAVENOUS | Status: DC
  Administered 2020-11-14 (×2): 2 g via INTRAVENOUS
  Filled 2020-11-14 (×2): qty 2

## 2020-11-14 MED ORDER — LACTATED RINGERS IV BOLUS
1000.0000 mL | Freq: Once | INTRAVENOUS | Status: AC
  Administered 2020-11-14: 1000 mL via INTRAVENOUS

## 2020-11-14 MED ORDER — DOCUSATE SODIUM 50 MG/5ML PO LIQD: 100.0000 mg | Freq: Two times a day (BID) | ORAL | Status: DC

## 2020-11-14 MED ORDER — OSMOLITE 1.5 CAL PO LIQD
1000.0000 mL | ORAL | Status: DC
  Administered 2020-11-14 – 2020-11-21 (×5): 1000 mL
  Filled 2020-11-14 (×2): qty 1000

## 2020-11-14 MED ORDER — POLYETHYLENE GLYCOL 3350 17 G PO PACK
17.0000 g | PACK | Freq: Every day | ORAL | Status: DC | PRN
Start: 2020-11-14 — End: 2020-11-16

## 2020-11-14 MED ORDER — SODIUM CHLORIDE 0.9 % IV SOLN: INTRAVENOUS | Status: DC

## 2020-11-14 MED ORDER — DEXMEDETOMIDINE HCL IN NACL 400 MCG/100ML IV SOLN
0.0000 ug/kg/h | INTRAVENOUS | Status: DC
  Administered 2020-11-14: 1 ug/kg/h via INTRAVENOUS
  Filled 2020-11-14: qty 100

## 2020-11-14 MED ORDER — FENTANYL CITRATE (PF) 100 MCG/2ML IJ SOLN
50.0000 ug | INTRAMUSCULAR | Status: DC | PRN
  Filled 2020-11-14: qty 2

## 2020-11-14 MED ORDER — VANCOMYCIN HCL 750 MG/150ML IV SOLN
750.0000 mg | Freq: Two times a day (BID) | INTRAVENOUS | Status: DC
  Filled 2020-11-14: qty 150

## 2020-11-14 MED ORDER — FLUMAZENIL 0.5 MG/5ML IV SOLN
0.5000 mg | Freq: Once | INTRAVENOUS | Status: AC
  Administered 2020-11-14: 0.5 mg via INTRAVENOUS

## 2020-11-14 MED ORDER — NOREPINEPHRINE 16 MG/250ML-% IV SOLN
0.0000 ug/min | INTRAVENOUS | Status: DC
  Administered 2020-11-14: 16 ug/min via INTRAVENOUS
  Filled 2020-11-14: qty 250

## 2020-11-14 MED ORDER — POLYETHYLENE GLYCOL 3350 17 G PO PACK: 17.0000 g | PACK | Freq: Every day | ORAL | Status: DC

## 2020-11-14 MED ORDER — CALCIUM GLUCONATE-NACL 1-0.675 GM/50ML-% IV SOLN
1.0000 g | Freq: Once | INTRAVENOUS | Status: AC
  Administered 2020-11-14: 1000 mg via INTRAVENOUS
  Filled 2020-11-14: qty 50

## 2020-11-14 MED ORDER — ORAL CARE MOUTH RINSE
15.0000 mL | OROMUCOSAL | Status: DC
  Administered 2020-11-14 – 2020-11-15 (×8): 15 mL via OROMUCOSAL

## 2020-11-14 MED ORDER — VASOPRESSIN 20 UNITS/100 ML INFUSION FOR SHOCK
0.0000 [IU]/min | INTRAVENOUS | Status: DC
Start: 2020-11-14 — End: 2020-11-16
  Administered 2020-11-14: 0.03 [IU]/min via INTRAVENOUS
  Filled 2020-11-14: qty 100

## 2020-11-14 MED ORDER — ACETAMINOPHEN 325 MG PO TABS: 650.0000 mg | ORAL_TABLET | Freq: Four times a day (QID) | ORAL | Status: DC | PRN

## 2020-11-14 MED ORDER — VANCOMYCIN VARIABLE DOSE PER UNSTABLE RENAL FUNCTION (PHARMACIST DOSING): Status: DC

## 2020-11-14 MED ORDER — FENTANYL CITRATE (PF) 100 MCG/2ML IJ SOLN
50.0000 ug | Freq: Once | INTRAMUSCULAR | Status: AC
  Administered 2020-11-14: 50 ug via INTRAVENOUS

## 2020-11-14 MED ORDER — CHLORHEXIDINE GLUCONATE 0.12% ORAL RINSE (MEDLINE KIT)
15.0000 mL | Freq: Two times a day (BID) | OROMUCOSAL | Status: DC
  Administered 2020-11-14 – 2020-11-15 (×3): 15 mL via OROMUCOSAL

## 2020-11-14 MED ORDER — ACETAMINOPHEN 160 MG/5ML PO SOLN
650.0000 mg | Freq: Four times a day (QID) | ORAL | Status: DC | PRN
  Administered 2020-11-14 – 2020-11-16 (×3): 650 mg
  Filled 2020-11-14 (×4): qty 20.3

## 2020-11-14 MED ORDER — PANTOPRAZOLE SODIUM 40 MG IV SOLR
40.0000 mg | Freq: Every day | INTRAVENOUS | Status: DC
  Administered 2020-11-14 – 2020-11-17 (×4): 40 mg via INTRAVENOUS
  Filled 2020-11-14 (×4): qty 40

## 2020-11-14 MED ORDER — DEXMEDETOMIDINE HCL IN NACL 400 MCG/100ML IV SOLN
0.4000 ug/kg/h | INTRAVENOUS | Status: DC
  Administered 2020-11-14 (×2): 1 ug/kg/h via INTRAVENOUS
  Administered 2020-11-14: 0.8 ug/kg/h via INTRAVENOUS
  Administered 2020-11-14: 1.2 ug/kg/h via INTRAVENOUS
  Administered 2020-11-15: 0.4 ug/kg/h via INTRAVENOUS
  Filled 2020-11-14 (×3): qty 100

## 2020-11-14 MED ORDER — SODIUM CHLORIDE 0.9 % IV SOLN
2.0000 g | Freq: Two times a day (BID) | INTRAVENOUS | Status: DC
  Administered 2020-11-14: 2 g via INTRAVENOUS
  Filled 2020-11-14 (×2): qty 2

## 2020-11-14 MED ORDER — DOCUSATE SODIUM 100 MG PO CAPS
100.0000 mg | ORAL_CAPSULE | Freq: Two times a day (BID) | ORAL | Status: DC | PRN
Start: 2020-11-14 — End: 2020-11-14

## 2020-11-14 MED ORDER — FENTANYL CITRATE (PF) 100 MCG/2ML IJ SOLN
50.0000 ug | INTRAMUSCULAR | Status: DC | PRN
  Administered 2020-11-14: 100 ug via INTRAVENOUS

## 2020-11-14 MED ORDER — FENTANYL 2500MCG IN NS 250ML (10MCG/ML) PREMIX INFUSION
50.0000 ug/h | INTRAVENOUS | Status: DC
  Administered 2020-11-14: 100 ug/h via INTRAVENOUS
  Filled 2020-11-14: qty 250

## 2020-11-14 MED ORDER — FENTANYL BOLUS VIA INFUSION
50.0000 ug | INTRAVENOUS | Status: DC | PRN
  Administered 2020-11-14: 100 ug via INTRAVENOUS
  Filled 2020-11-14: qty 100

## 2020-11-14 NOTE — Progress Notes (Signed)
EEG complete - results pending 

## 2020-11-14 NOTE — Progress Notes (Signed)
Pharmacy Antibiotic Note  Jesse [name] is a 45 y.o. male admitted on 11/14/2020 with sepsis.  Patient admitted to CVICU with stroke, went to IR and became hypotensive with fever. Transferred back to CVICU. Pharmacy has been consulted for vancomycin and cefepime dosing.  Patient noted to be in AKI after rechecking labs this morning post CT. Scr noted to have increased from 1>2.2.   Plan: Vancomycin load 20-25 mg/kg (1250 mg x1) Will hold standing doses and consider checking random level in am prior to redosing Cefepime 2 g q12h    Temp (24hrs), Avg:100.8 F (38.2 C), Min:97.9 F (36.6 C), Max:103.3 F (39.6 C)  Recent Labs  Lab 11/10/20 0603 11/11/20 1059 11/12/20 0422 11/13/20 0041 11/14/20 0854 11/14/20 1229  WBC 5.1  --  7.0 5.5  --  10.9*  CREATININE 0.93 0.89 0.99 1.01 2.28*  --   LATICACIDVEN  --   --   --   --  1.2  --      Estimated Creatinine Clearance: 33.2 mL/min (A) (by C-G formula based on SCr of 2.28 mg/dL (H)).    No Known Allergies  Antimicrobials this admission: Keflex 7/19 >> 7/20 Vanco 7/19 >> 7/20, 7/22>> Cefepime 7/22 >>  Dose adjustments this admission: Prior vancomycin 1500mg  x1 then 1250 mg IV Q 12 hrs. Goal AUC 400-550. Expected AUC: 550 SCr used: 0.89 No prior troughs as never at steady state  Microbiology results: 7/19 BCx: ngtd 7/12, 7/16 Sputum: ngtd   Thank you for allowing pharmacy to be a part of this patient's care.  8/16 PharmD., BCPS Clinical Pharmacist 11/14/2020 3:35 PM

## 2020-11-14 NOTE — Discharge Summary (Signed)
Physician Discharge Summary  Patient ID: Jesse [name] MRN: [id number] DOB/AGE: [birth year] 45 y.o.  Admit date: 11/13/2020 Discharge date: 11/14/2020  Discharge Diagnoses:  Principal Problem:   Acute ischemic right MCA stroke [facility]) Active Problems:   Aortic dissection (HCC)   Agitation Aortic dissection Dysphagia Hypertension  Discharged Condition: Guarded Significant Diagnostic Studies: DG Chest 1 View  Result Date: 11/07/2020 CLINICAL DATA:  45 year old male with code stroke. Attempted to pull out endotracheal tube. EXAM: CHEST  1 VIEW COMPARISON:  Chest x-ray 11/06/2020. FINDINGS: An endotracheal tube is in place with tip 8.9 cm above the carina. A nasogastric tube is seen extending into the stomach, however, the tip of the nasogastric tube extends below the lower margin of the image. There is a surgical drain projecting over the medial aspect of the right hemithorax, either a mediastinal drain or medially placed right-sided chest tube. Lung volumes are normal. Opacity in the left lower lobe, favored to represent postoperative atelectasis. Small left pleural effusion. Right lung is clear. No right pleural effusion. No appreciable pneumothorax. No evidence of pulmonary edema. Heart size is normal. Mediastinal contours are within normal limits. Surgical clips are noted to the right-side of the thoracic inlet. IMPRESSION: 1. Support apparatus, as above. 2. Probable atelectasis in the left lower lobe. 3. Small left pleural effusion. Electronically Signed   By: Trudie Reed M.D.   On: 11/07/2020 06:31   DG Abd 1 View  Result Date: 11/09/2020 CLINICAL DATA:  NG tube placement EXAM: ABDOMEN - 1 VIEW COMPARISON:  11/07/2020 FINDINGS: Limited radiograph of the lower chest and upper abdomen was obtained for the purposes of enteric tube localization. Enteric tube is seen coursing below the diaphragm with distal tip terminating within the expected location of the gastric body. IMPRESSION: Enteric  tube tip terminating within the gastric body. Electronically Signed   By: Duanne Guess D.O.   On: 11/09/2020 10:52   CT HEAD WO CONTRAST  Result Date: 11/12/2020 CLINICAL DATA:  Neuro deficit, acute stroke suspected. EXAM: CT HEAD WITHOUT CONTRAST TECHNIQUE: Contiguous axial images were obtained from the base of the skull through the vertex without intravenous contrast. COMPARISON:  CT head 11/07/2019. 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 Interval development faint curvilinear gyral hyperdensity in this region, compatible with petechial hemorrhage. No mass occupying hemorrhagic transformation. Mass effect is mildly progressed with approximately 4 mm of leftward midline shift at the foramen of Monro. No evidence of new/interval acute large vascular territory infarct. No hydrocephalus. Basal cisterns are patent. No extra-axial fluid collection. Vascular: Better evaluated on prior MRA. Skull: No acute fracture. Sinuses/Orbits: Polypoid mucosal thickening in the right sphenoid sinus and mild ethmoid air cell mucosal thickening. No acute orbital findings. Other: No mastoid effusions. 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 No mass occupying hemorrhagic transformation. Cytotoxic edema and mass effect is mildly progressed with approximately 4 mm of leftward midline shift at the foramen of Monro (previously 3 mm when remeasured). Electronically Signed   By: Feliberto Harts MD   On: 11/12/2020 08:02   CT HEAD WO CONTRAST  Result Date: 11/06/2020 CLINICAL DATA:  45 year old male code stroke presentation. Type a aortic dissection. Right carotid involvement and occlusion, distal right M2 occlusion. Subsequent encounter. EXAM: CT HEAD WITHOUT CONTRAST TECHNIQUE:  Contiguous axial images were obtained from the base of  the skull through the vertex without intravenous contrast. COMPARISON:  CTA head and neck, and CT headi 11/04/2020 FINDINGS: Brain: Confluent cytotoxic edema throughout a roughly 8 cm area of the posterior right hemisphere involving the posterior right MCA and right MCA/PCA watershed area. See sagittal image 15. Mild regional mass effect. Mild mass effect on the right lateral ventricle and trace leftward midline shift (series 3, image 23). No hemorrhagic transformation. Basilar cisterns remain patent. No ventriculomegaly. Deep gray nuclei appear stable. Left hemisphere and posterior fossa gray-white matter differentiation is stable. Vascular: No large vessel hyperdensity. Skull: Negative. Sinuses/Orbits: Visualized paranasal sinuses and mastoids are stable and well aerated. Other: Visualized orbits and scalp soft tissues are within normal limits. IMPRESSION: 1. Relatively large infarct with cytotoxic edema in the posterior Right MCA and MCA/PCA watershed territories. Mild mass effect including trace leftward midline shift. No hemorrhagic transformation. 2. Preserved gray-white matter differentiation elsewhere. Electronically Signed   By: Odessa Fleming M.D.   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 Status post graft repair. EXAM: MRI HEAD WITHOUT CONTRAST MRA HEAD WITHOUT CONTRAST MRA NECK WITHOUT AND WITH CONTRAST TECHNIQUE: Multiplanar, multi-echo pulse sequences of the brain and surrounding structures were acquired without intravenous contrast. Angiographic images of the Circle of Willis were acquired using MRA technique without intravenous contrast. Angiographic images of the neck were acquired using MRA technique without and with intravenous  contrast. Carotid stenosis measurements (when applicable) are obtained utilizing NASCET criteria, using the distal internal carotid diameter as the denominator. CONTRAST:  6.1mL GADAVIST GADOBUTROL 1 MMOL/ML IV SOLN COMPARISON:  Prior CTs from 11/06/2020 and 11/04/2020 FINDINGS: MRI HEAD FINDINGS Brain: Cerebral volume within normal limits. Few scattered subcentimeter foci of T2/FLAIR hyperintensity noted involving the periventricular and deep white matter both cerebral hemispheres, consistent with chronic microvascular ischemic disease, mild in nature. Moderately large area of restricted diffusion involving the right temporal occipital and parietal region, consistent with right MCA and MCA/PCA watershed infarct, relatively stable from prior CT. Associated gyral swelling and edema with regional mass effect. Associated right-to-left shift now measures 5 mm, slightly increased from prior. No associated hemorrhage. Multiple additional scattered subcentimeter acute ischemic infarcts noted involving the bilateral cerebral hemispheres, embolic in nature. Largest of these additional foci seen at the high posterior right parietal region and measures 8 mm (series 5, image 94). Patchy involvement of the basal ganglia/corona radiata noted. Few additional punctate ischemic infarcts noted involving the right cerebellum. No associated hemorrhage or mass effect about these additional infarcts. Gray-white matter differentiation otherwise maintained. No other areas of chronic cortical infarction. No acute intracranial hemorrhage. Few scattered punctate chronic micro hemorrhages noted involving both cerebral hemispheres, likely related to underlying hypertension. No mass lesion. No hydrocephalus or ventricular trapping. Basilar cisterns remain patent. No extra-axial fluid collection. Pituitary gland suprasellar region normal. Midline structures intact. Vascular: Major intracranial vascular flow voids are maintained. Skull and upper  cervical spine: Craniocervical junction within normal limits. Bone marrow signal intensity normal. No scalp soft tissue abnormality. Sinuses/Orbits: Globes and orbital soft tissues within normal limits. Scattered mucosal thickening noted throughout the ethmoidal air cells and maxillary sinuses. Fluid seen within the nasopharynx with small to moderate bilateral mastoid effusions. Patient is intubated. Other: None. MRA HEAD FINDINGS Anterior circulation: Widely patent antegrade flow seen within the distal cervical segments  of both internal carotid arteries, with interval re-establishment of flow within the cervical right ICA since prior. Both ICAs remain patent to the termini without definite stenosis. Apparent focal stenoses involving the anterior genus of both cavernous ICAs on MIP reconstructions favored to be artifactual in nature. A1 segments patent bilaterally. Normal anterior communicating artery complex. Anterior cerebral arteries patent to their distal aspects without stenosis. No M1 stenosis or occlusion. Normal MCA bifurcations. Previously seen right MCA branch occlusions not definitely seen by MRA. Right MCA branches now appear well perfused and symmetric. Posterior circulation: Both V4 segments patent to the vertebrobasilar junction without stenosis. Right vertebral artery dominant. Partially visualized PICA patent. Basilar patent to its distal aspect without stenosis. Superior cerebellar arteries patent bilaterally. Right PCA primarily supplied via the basilar. Fetal type origin of the left PCA. Both PCAs remain well perfused to their distal aspects. Anatomic variants: Fetal type origin of the left PCA.  No aneurysm. 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 The visualized native arch is normal in caliber. The native left common carotid and subclavian arteries arise directly from the native arch. There  has also been interval performance of an aorta to right subclavian bypass that supplies the right common and subclavian arteries. No visible stenosis or adverse features. Right carotid system: Right CCA now seen arising from the right subclavian bypass. Previously seen right CCA dissection has been repaired, with the right CCA now seen widely patent to the bifurcation. No significant atheromatous narrowing or irregularity about the right carotid bulb. Right ICA now patent and well perfused to the skull base without stenosis or occlusion. No MRA evidence for residual dissection. Left carotid system: Left CCA arises from the repaired native aortic arch and is widely patent to the bifurcation. No significant narrowing about the left carotid bulb. Left ICA patent distally to the skull base without stenosis, evidence for dissection or occlusion. Vertebral arteries: Left vertebral artery arises from the proximal left subclavian artery, just beyond its takeoff from the native aortic arch. Right vertebral artery arises from the right subclavian bypass. 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 Vertebral arteries themselves are patent within the neck without stenosis, evidence for dissection, or occlusion. Right vertebral artery dominant. Other: None. IMPRESSION: MRI HEAD IMPRESSION: 1. 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 Associated regional mass effect with 5 mm of right-to-left shift, slightly increased from prior. 2. Additional scattered subcentimeter acute ischemic infarcts involving the bilateral cerebral hemispheres and right cerebellum, embolic in nature. No associated hemorrhage or mass effect. 3. No other acute intracranial abnormality. MRA HEAD IMPRESSION: 1.  Interval re-establishment of flow within the right ICA, now patent to the terminus. Right MCA well perfused, with no visible persistent downstream occlusion evident by MRA. 2. Otherwise stable and unremarkable intracranial MRA. MRA NECK IMPRESSION: 1. 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 2. Right carotid artery system supplied via the right subclavian bypass, and is now widely patent without residual dissection or other abnormality. 3. Left carotid artery system supplied via the native aortic arch, and remains widely patent without stenosis or other abnormality. 4. 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 Follow-up examination with dedicated CTA could be performed for further evaluation of this finding as warranted. 5. Both vertebral arteries remain widely patent within the neck. Right vertebral artery dominant. Electronically Signed   By: Rise Mu M.D.   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 Status post graft repair. EXAM: MRI HEAD WITHOUT CONTRAST MRA HEAD WITHOUT CONTRAST MRA NECK WITHOUT AND WITH CONTRAST TECHNIQUE: Multiplanar, multi-echo pulse sequences of the brain and surrounding structures were acquired without intravenous contrast. Angiographic images of the Circle of Willis were acquired using MRA technique without intravenous contrast. Angiographic images of the neck were acquired using MRA technique without and with intravenous contrast. Carotid stenosis measurements (when applicable) are obtained utilizing NASCET criteria, using the distal internal carotid diameter as the denominator. CONTRAST:   6.50mL GADAVIST GADOBUTROL 1 MMOL/ML IV SOLN COMPARISON:  Prior CTs from 11/06/2020 and 11/04/2020 FINDINGS: MRI HEAD FINDINGS Brain: Cerebral volume within normal limits. Few scattered subcentimeter foci of T2/FLAIR hyperintensity noted involving the periventricular and deep white matter both cerebral hemispheres, consistent with chronic microvascular ischemic disease, mild in nature. Moderately large area of restricted diffusion involving the right temporal occipital and parietal region, consistent with right MCA and MCA/PCA watershed infarct, relatively stable from prior CT. Associated gyral swelling and edema with regional mass effect. Associated right-to-left shift now measures 5 mm, slightly increased from prior. No associated hemorrhage. Multiple additional scattered subcentimeter acute ischemic infarcts noted involving the bilateral cerebral hemispheres, embolic in nature. Largest of these additional foci seen at the high posterior right parietal region and measures 8 mm (series 5, image 94). Patchy involvement of the basal ganglia/corona radiata noted. Few additional punctate ischemic infarcts noted involving the right cerebellum. No associated hemorrhage or mass effect about these additional infarcts. Gray-white matter differentiation otherwise maintained. No other areas of chronic cortical infarction. No acute intracranial hemorrhage. Few scattered punctate chronic micro hemorrhages noted involving both cerebral hemispheres, likely related to underlying hypertension. No mass lesion. No hydrocephalus or ventricular trapping. Basilar cisterns remain patent. No extra-axial fluid collection. Pituitary gland suprasellar region normal. Midline structures intact. Vascular: Major intracranial vascular flow voids are maintained. Skull and upper cervical spine: Craniocervical junction within normal limits. Bone marrow signal intensity normal. No scalp soft tissue abnormality. Sinuses/Orbits: Globes and orbital soft  tissues within normal limits. Scattered mucosal thickening noted throughout the ethmoidal air cells and maxillary sinuses. Fluid seen within the nasopharynx with small to moderate bilateral mastoid effusions. Patient is intubated. Other: None. MRA HEAD FINDINGS Anterior circulation: Widely patent antegrade flow seen within the distal cervical segments of both internal carotid arteries, with interval re-establishment of flow within the cervical right ICA since prior. Both ICAs remain patent to the termini without definite stenosis. Apparent focal stenoses involving the anterior genus of both cavernous ICAs on MIP reconstructions favored to be artifactual in nature. A1 segments patent bilaterally. Normal anterior communicating artery complex. Anterior cerebral arteries patent to their distal aspects without stenosis. No M1 stenosis or occlusion. Normal MCA bifurcations. Previously seen right MCA branch occlusions not definitely seen by MRA. Right MCA branches now appear well perfused and symmetric. Posterior circulation: Both V4 segments patent to the vertebrobasilar junction without stenosis. Right vertebral artery dominant. Partially visualized PICA patent. Basilar patent  to its distal aspect without stenosis. Superior cerebellar arteries patent bilaterally. Right PCA primarily supplied via the basilar. Fetal type origin of the left PCA. Both PCAs remain well perfused to their distal aspects. Anatomic variants: Fetal type origin of the left PCA.  No aneurysm. 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 The visualized native arch is normal in caliber. The native left common carotid and subclavian arteries arise directly from the native arch. There has also been interval performance of an aorta to right subclavian bypass that supplies the right common and subclavian arteries. No visible stenosis or adverse features.  Right carotid system: Right CCA now seen arising from the right subclavian bypass. Previously seen right CCA dissection has been repaired, with the right CCA now seen widely patent to the bifurcation. No significant atheromatous narrowing or irregularity about the right carotid bulb. Right ICA now patent and well perfused to the skull base without stenosis or occlusion. No MRA evidence for residual dissection. Left carotid system: Left CCA arises from the repaired native aortic arch and is widely patent to the bifurcation. No significant narrowing about the left carotid bulb. Left ICA patent distally to the skull base without stenosis, evidence for dissection or occlusion. Vertebral arteries: Left vertebral artery arises from the proximal left subclavian artery, just beyond its takeoff from the native aortic arch. Right vertebral artery arises from the right subclavian bypass. 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 Vertebral arteries themselves are patent within the neck without stenosis, evidence for dissection, or occlusion. Right vertebral artery dominant. Other: None. IMPRESSION: MRI HEAD IMPRESSION: 1. 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 Associated regional mass effect with 5 mm of right-to-left shift, slightly increased from prior. 2. Additional scattered subcentimeter acute ischemic infarcts involving the bilateral cerebral hemispheres and right cerebellum, embolic in nature. No associated hemorrhage or mass effect. 3. No other acute intracranial abnormality. MRA HEAD IMPRESSION: 1. Interval re-establishment of flow within the right ICA, now patent to the terminus. Right MCA well perfused, with no visible persistent downstream occlusion evident by MRA. 2.  Otherwise stable and unremarkable intracranial MRA. MRA NECK IMPRESSION: 1. 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 2. Right carotid artery system supplied via the right subclavian bypass, and is now widely patent without residual dissection or other abnormality. 3. Left carotid artery system supplied via the native aortic arch, and remains widely patent without stenosis or other abnormality. 4. 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 Follow-up examination with dedicated CTA could be performed for further evaluation of this finding as warranted. 5. Both vertebral arteries remain widely patent within the neck. Right vertebral artery dominant. Electronically Signed   By: Rise Mu M.D.   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 Status post graft repair. EXAM: MRI HEAD WITHOUT CONTRAST MRA HEAD WITHOUT CONTRAST MRA NECK WITHOUT AND WITH CONTRAST TECHNIQUE: Multiplanar, multi-echo pulse sequences of the  brain and surrounding structures were acquired without intravenous contrast. Angiographic images of the Circle of Willis were acquired using MRA technique without intravenous contrast. Angiographic images of the neck were acquired using MRA technique without and with intravenous contrast. Carotid stenosis measurements (when applicable) are obtained utilizing NASCET criteria, using the distal internal carotid diameter as the denominator. CONTRAST:  6.41mL GADAVIST GADOBUTROL 1 MMOL/ML IV SOLN COMPARISON:  Prior CTs from 11/06/2020 and 11/04/2020 FINDINGS: MRI HEAD FINDINGS Brain: Cerebral volume within normal limits. Few scattered  subcentimeter foci of T2/FLAIR hyperintensity noted involving the periventricular and deep white matter both cerebral hemispheres, consistent with chronic microvascular ischemic disease, mild in nature. Moderately large area of restricted diffusion involving the right temporal occipital and parietal region, consistent with right MCA and MCA/PCA watershed infarct, relatively stable from prior CT. Associated gyral swelling and edema with regional mass effect. Associated right-to-left shift now measures 5 mm, slightly increased from prior. No associated hemorrhage. Multiple additional scattered subcentimeter acute ischemic infarcts noted involving the bilateral cerebral hemispheres, embolic in nature. Largest of these additional foci seen at the high posterior right parietal region and measures 8 mm (series 5, image 94). Patchy involvement of the basal ganglia/corona radiata noted. Few additional punctate ischemic infarcts noted involving the right cerebellum. No associated hemorrhage or mass effect about these additional infarcts. Gray-white matter differentiation otherwise maintained. No other areas of chronic cortical infarction. No acute intracranial hemorrhage. Few scattered punctate chronic micro hemorrhages noted involving both cerebral hemispheres, likely related to underlying hypertension. No mass lesion. No hydrocephalus or ventricular trapping. Basilar cisterns remain patent. No extra-axial fluid collection. Pituitary gland suprasellar region normal. Midline structures intact. Vascular: Major intracranial vascular flow voids are maintained. Skull and upper cervical spine: Craniocervical junction within normal limits. Bone marrow signal intensity normal. No scalp soft tissue abnormality. Sinuses/Orbits: Globes and orbital soft tissues within normal limits. Scattered mucosal thickening noted throughout the ethmoidal air cells and maxillary sinuses. Fluid seen within the nasopharynx with small to moderate  bilateral mastoid effusions. Patient is intubated. Other: None. MRA HEAD FINDINGS Anterior circulation: Widely patent antegrade flow seen within the distal cervical segments of both internal carotid arteries, with interval re-establishment of flow within the cervical right ICA since prior. Both ICAs remain patent to the termini without definite stenosis. Apparent focal stenoses involving the anterior genus of both cavernous ICAs on MIP reconstructions favored to be artifactual in nature. A1 segments patent bilaterally. Normal anterior communicating artery complex. Anterior cerebral arteries patent to their distal aspects without stenosis. No M1 stenosis or occlusion. Normal MCA bifurcations. Previously seen right MCA branch occlusions not definitely seen by MRA. Right MCA branches now appear well perfused and symmetric. Posterior circulation: Both V4 segments patent to the vertebrobasilar junction without stenosis. Right vertebral artery dominant. Partially visualized PICA patent. Basilar patent to its distal aspect without stenosis. Superior cerebellar arteries patent bilaterally. Right PCA primarily supplied via the basilar. Fetal type origin of the left PCA. Both PCAs remain well perfused to their distal aspects. Anatomic variants: Fetal type origin of the left PCA.  No aneurysm. 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 The visualized native arch is normal in caliber. The native left common carotid and subclavian arteries arise directly from the native arch. There has also been interval performance of an aorta to right subclavian bypass that supplies the right common and subclavian arteries. No visible stenosis or adverse features. Right carotid system:  Right CCA now seen arising from the right subclavian bypass. Previously seen right CCA dissection has been repaired, with the right CCA now seen widely patent  to the bifurcation. No significant atheromatous narrowing or irregularity about the right carotid bulb. Right ICA now patent and well perfused to the skull base without stenosis or occlusion. No MRA evidence for residual dissection. Left carotid system: Left CCA arises from the repaired native aortic arch and is widely patent to the bifurcation. No significant narrowing about the left carotid bulb. Left ICA patent distally to the skull base without stenosis, evidence for dissection or occlusion. Vertebral arteries: Left vertebral artery arises from the proximal left subclavian artery, just beyond its takeoff from the native aortic arch. Right vertebral artery arises from the right subclavian bypass. 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 Vertebral arteries themselves are patent within the neck without stenosis, evidence for dissection, or occlusion. Right vertebral artery dominant. Other: None. IMPRESSION: MRI HEAD IMPRESSION: 1. 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 Associated regional mass effect with 5 mm of right-to-left shift, slightly increased from prior. 2. Additional scattered subcentimeter acute ischemic infarcts involving the bilateral cerebral hemispheres and right cerebellum, embolic in nature. No associated hemorrhage or mass effect. 3. No other acute intracranial abnormality. MRA HEAD IMPRESSION: 1. Interval re-establishment of flow within the right ICA, now patent to the terminus. Right MCA well perfused, with no visible persistent downstream occlusion evident by MRA. 2. Otherwise stable and unremarkable intracranial MRA. MRA NECK IMPRESSION: 1. Interval graft repair of previously seen aortic dissection with performance of an aorta to right  subclavian bypass. 2. Right carotid artery system supplied via the right subclavian bypass, and is now widely patent without residual dissection or other abnormality. 3. Left carotid artery system supplied via the native aortic arch, and remains widely patent without stenosis or other abnormality. 4. 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 Follow-up examination with dedicated CTA could be performed for further evaluation of this finding as warranted. 5. Both vertebral arteries remain widely patent within the neck. Right vertebral artery dominant. Electronically Signed   By: Rise Mu M.D.   On: 11/07/2020 20:51   DG Chest Port 1 View  Result Date: 11/13/2020 CLINICAL DATA:  Shortness of breath. Recent surgery for aortic dissection. EXAM: PORTABLE CHEST 1 VIEW COMPARISON:  Chest x-ray 11/09/2020. FINDINGS: Left costophrenic angle incompletely imaged. Feeding tube is been advanced, tip below left hemidiaphragm. Cardiomegaly again noted. Mild bilateral interstitial prominence suggesting mild interstitial edema. Small right pleural effusion cannot be excluded. No pneumothorax. Surgical clips over the upper chest. IMPRESSION: 1. Interim advancement of feeding tube, tip below left hemidiaphragm. 2. Cardiomegaly again noted. Mild bilateral interstitial prominence suggesting mild interstitial edema. Small right pleural effusion cannot be excluded. Electronically Signed   By: Maisie Fus  Register   On: 11/13/2020 07:29   DG CHEST PORT 1 VIEW  Result Date: 11/09/2020 CLINICAL DATA:  NG tube placement EXAM: PORTABLE CHEST 1 VIEW COMPARISON:  11/08/2020 FINDINGS: Enteric tube tip terminates at the level of the distal esophagus. Stable cardiomediastinal contours. Hazy bibasilar opacities, slightly improved from prior. No pneumothorax is seen, although the lung apices were excluded from the field of view. IMPRESSION:  1.  Enteric tube tip terminates at the level of the distal esophagus. Subsequent abdominal radiograph in PACS demonstrates interval advancement. 2. Improving aeration of the lung bases. Electronically Signed   By: Duanne Guess D.O.   On: 11/09/2020 10:52   DG Chest Port 1 View  Result Date: 11/08/2020 CLINICAL DATA:  Self extubation EXAM: PORTABLE CHEST 1 VIEW COMPARISON:  11/07/2020 FINDINGS: Single frontal view of the chest demonstrates interval removal of the endotracheal tube and enteric catheter. There is a linear radiopaque structure overlying the trachea at the C7 level, indeterminate in nature. Cardiac silhouette is unremarkable. Bibasilar veiling opacities are identified, left greater than right, consistent with underlying consolidation and/or effusion. No pneumothorax. IMPRESSION: 1. Progressive bibasilar veiling opacities, left greater than right, consistent with underlying consolidation and effusions. 2. Interval removal of the endotracheal and enteric catheters. A short linear radiodensity projects over the trachea at the C7 level, of uncertain etiology. Electronically Signed   By: Sharlet Salina M.D.   On: 11/08/2020 17:06   DG CHEST PORT 1 VIEW  Result Date: 11/06/2020 CLINICAL DATA:  45 year old male status post intubation. EXAM: PORTABLE CHEST 1 VIEW COMPARISON:  Chest radiograph dated 11/06/2020. FINDINGS: Endotracheal tube with tip approximately 5.5 cm above the carina. Interval removal of the right IJ catheter. Two inferiorly accessed mediastinal drains again noted. Probable small left pleural effusion. Right infrahilar hazy density, possibly atelectasis. No obvious pneumothorax. Stable cardiomegaly. No acute osseous pathology. IMPRESSION: 1. Interval removal of the right IJ catheter. 2. Endotracheal tube above the carina. 3. Probable small left pleural effusion. Electronically Signed   By: Elgie Collard M.D.   On: 11/06/2020 20:42   DG Chest Port 1 View  Addendum Date:  11/06/2020   ADDENDUM REPORT: 11/06/2020 09:13 ADDENDUM: Also with slight increased retrocardiac opacification and RIGHT basilar opacification since prior study. These findings and findings in the initial report were discussed with the provider as outlined below. These results were called by telephone at the time of interpretation on 11/06/2020 at 9:13 am to provider Tressie Stalker , who verbally acknowledged these results. Electronically Signed   By: Donzetta Kohut M.D.   On: 11/06/2020 09:13   Result Date: 11/06/2020 CLINICAL DATA:  Post aortic dissection repair in a 45 year old male. EXAM: PORTABLE CHEST 1 VIEW COMPARISON:  November 05, 2020. FINDINGS: EKG leads project over the chest. Since the previous study the patient has been extubated. Chest support tubes project over the heart terminating just below the clavicular heads with respect to the more rightward oriented tube, not changed in position. The LEFT-sided tube terminates over the LEFT chest in the retrocardiac region with similar appearance. RIGHT IJ sheath in similar position Swan-Ganz catheter now removed. Persistent tiny RIGHT apical pneumothorax, unchanged. Trachea midline. Cardiomediastinal contours and hilar structures are stable. Graded opacity at the RIGHT lung base is subtle with obscured LEFT hemidiaphragm along the medial aspect when compared to the prior study. Possible deep sulcus sign on the LEFT otherwise no evidence of LEFT-sided pneumothorax. On limited assessment no acute skeletal process. IMPRESSION: 1. Persistent tiny RIGHT apical pneumothorax. 2. Possible deep sulcus sign on the LEFT potentially a very small LEFT pneumothorax. Attention on follow-up. 3. Interval extubation, removal of gastric tube and removal of Swan-Ganz catheter, otherwise stable position of support apparatus. A call is out to the referring provider to further discuss findings in the above case. Electronically Signed: By: Donzetta Kohut M.D. On: 11/06/2020 09:05    DG Chest Port 1 View  Result Date: 11/05/2020  CLINICAL DATA:  45 year old male with open heart surgery. EXAM: PORTABLE CHEST 1 VIEW COMPARISON:  Chest CT dated 11/04/2020. FINDINGS: Right IJ Swan-Ganz catheter with tip likely close to the bifurcation of the main pulmonary trunk. Endotracheal tube with tip approximately 6.5 cm above the carina. Enteric tube extends below the diaphragm with tip beyond the inferior margin of the image. Inferiorly accessed mediastinal drain. The lungs are clear. There is no pleural effusion. Tiny linear lucency in the right apex may be artifactual or represent minimal right pneumothorax measuring approximately 4 mm in thickness. Close follow-up recommended. The cardiac silhouette is within limits. No acute osseous pathology. IMPRESSION: 1. Status post open heart surgery with support apparatus as above. 2. Possible tiny right apical pneumothorax. Attention on follow-up imaging recommended. These results were called by telephone at the time of interpretation on 11/05/2020 at 4:03 am to the patient's nurse Jess Barters, who verbally acknowledged these results. Electronically Signed   By: Elgie Collard M.D.   On: 11/05/2020 04:07   DG Abd Portable 1V  Result Date: 11/07/2020 CLINICAL DATA:  Feeding tube placement EXAM: PORTABLE ABDOMEN - 1 VIEW COMPARISON:  11/07/2020 at 0310 hours FINDINGS: Interval placement of large bore feeding tube which extends below the diaphragm, distal tip beyond the inferior margin of the film. Additional enteric tube is again seen terminating within the gastric body. Mediastinal drain and left basilar chest tube are seen. IMPRESSION: Interval placement of feeding tube which extends below the diaphragm into the stomach with distal tip beyond the inferior margin of the film. Electronically Signed   By: Duanne Guess D.O.   On: 11/07/2020 12:33   DG Abd Portable 1V  Result Date: 11/07/2020 CLINICAL DATA:  45 year male status post feeding tube  placement. EXAM: PORTABLE ABDOMEN - 1 VIEW COMPARISON:  Chest radiograph dated 11/06/2020 and CT of the chest abdomen pelvis dated 11/04/2020. FINDINGS: Partially visualized enteric tube with side-port just distal to the GE junction and tip in the proximal body of the stomach. Inferiorly accessed mediastinal drains noted. No bowel dilatation or evidence of obstruction. No free air. Probable small left pleural effusion. IMPRESSION: Enteric tube with tip in the proximal body of the stomach. Electronically Signed   By: Elgie Collard M.D.   On: 11/07/2020 03:35   EEG adult  Result Date: 11/08/2020 Charlsie Quest, MD     11/08/2020  6:22 PM Patient Name: Yostin Malacara MRN: [id number] Epilepsy Attending: Charlsie Quest Referring Physician/Provider: Dr Delia Heady Date: 11/08/2020 Duration: 23.42 mins Patient history: 45yo M with R MCA infarct and ams. EEG to evaluate for seizure Level of alertness: Awake AEDs during EEG study: Clonazepam Technical aspects: This EEG study was done with scalp electrodes positioned according to the 10-20 International system of electrode placement. Electrical activity was acquired at a sampling rate of  and reviewed with a high frequency filter of  and a low frequency filter of . EEG data were recorded continuously and digitally stored. Description: No posterior dominant rhythm was seen. EEG showed continuous 5-8Hz  theta-alpha activity in left hemisphere and low amplitude 2-3h in right hemisphere. Hyperventilation and photic stimulation were not performed.   ABNORMALITY - Continuous slow, generalized and lateralized right hemisphere IMPRESSION: This study is suggestive of cortical dysfunction arising from right hemisphere likely secondary to underlying structural abnormality/stroke. Additionally, there is moderate diffuse encephalopathy, nonspecific etiology. No seizures or epileptiform discharges were seen throughout the recording. Priyanka Annabelle Harman   [place] LT UPPER  EXTREM LTD SOFT TISSUE  NON VASCULAR  Result Date: 11/13/2020 CLINICAL DATA:  Pain and swelling in the antecubital fossa. Evaluate for possible abscess. EXAM: ULTRASOUND left UPPER EXTREMITY LIMITED TECHNIQUE: Ultrasound examination of the upper extremity soft tissues was performed in the area of clinical concern. COMPARISON:  None. FINDINGS: There is moderate inflammation/edema in the antecubital fossa but no discrete fluid collection to suggest a drainable abscess. IMPRESSION: Inflammatory changes with moderate edema in the soft tissues but no discrete abscess. Electronically Signed   By: Rudie Meyer M.D.   On: 11/13/2020 08:10   ECHO INTRAOPERATIVE TEE  Result Date: 11/05/2020  *INTRAOPERATIVE TRANSESOPHAGEAL REPORT *  Patient Name:   JAXAN MICHEL Date of Exam: 11/04/2020 Medical Rec #:  [id number]     Height: Accession #:    [id number]    Weight:       145.5 lb Date of Birth:  1976/03/06    BSA:          1.63 m Patient Age:    44 years      BP:           163/100 mmHg Patient Gender: M             HR:           70 bpm. Exam Location:  Inpatient Transesophogeal exam was perform intraoperatively during surgical procedure. Patient was closely monitored under general anesthesia during the entirety of examination. Indications:     Aortic dissection Performing Phys: [id number] CLARENCE H OWEN Diagnosing Phys: Lewie Loron MD Complications: No known complications during this procedure. POST-OP IMPRESSIONS - Left Ventricle: The left ventricle is unchanged from pre-bypass. - Right Ventricle: The right ventricle appears unchanged from pre-bypass. - Aorta: there is no dissection present in the aorta A graft was placed in the ascending aorta for repair. - Left Atrial Appendage: The left atrial appendage appears unchanged from pre-bypass. - Aortic Valve: The aortic valve appears unchanged from pre-bypass. - Mitral Valve: The mitral valve appears unchanged from pre-bypass. - Tricuspid Valve: The tricuspid valve appears  unchanged from pre-bypass. PRE-OP FINDINGS  Left Ventricle: The left ventricle has normal systolic function, with an ejection fraction of 55-60%. The cavity size was normal. There is severe concentric left ventricular hypertrophy. Right Ventricle: The right ventricle has normal systolic function. The cavity was normal. There is no increase in right ventricular wall thickness. Left Atrium: Left atrial size was normal in size. No left atrial/left atrial appendage thrombus was detected. Right Atrium: Right atrial size was normal in size. Interatrial Septum: The interatrial septum was not assessed. There is no evidence of a patent foramen ovale. Pericardium: Trivial pericardial effusion is present. Mitral Valve: The mitral valve is normal in structure. Mitral valve regurgitation is trivial by color flow Doppler. Tricuspid Valve: The tricuspid valve was normal in structure. Tricuspid valve regurgitation is trivial by color flow Doppler. Aortic Valve: The aortic valve is tricuspid Aortic valve regurgitation is trivial by color flow Doppler. There is no stenosis of the aortic valve. Pulmonic Valve: The pulmonic valve was normal in structure. Pulmonic valve regurgitation is not visualized by color flow Doppler. Aorta: There is evidence of a dissection in the aortic root, ascending aorta and aortic arch. Shunts: There is no evidence of an atrial septal defect.  Lewie Loron MD Electronically signed by Lewie Loron MD Signature Date/Time: 11/05/2020/2:44:37 AM    Final    CT Angio Chest/Abd/Pel for Dissection W and/or W/WO  Result Date: 11/04/2020 CLINICAL DATA:  Further evaluation of aortic dissection  seen on prior same day imaging EXAM: CT ANGIOGRAPHY CHEST, ABDOMEN AND PELVIS TECHNIQUE: Non-contrast CT of the chest was initially obtained. Multidetector CT imaging through the chest, abdomen and pelvis was performed using the standard protocol during bolus administration of intravenous contrast. Multiplanar  reconstructed images and MIPs were obtained and reviewed to evaluate the vascular anatomy. CONTRAST:  OMNIPAQUE IOHEXOL 350 MG/ML SOLN COMPARISON:  Same day CT neck. FINDINGS: CTA CHEST FINDINGS Cardiovascular: Ascending aortic dissection extending from superior to the sino-tubular junction to the transverse arch with continuation of the dissection into the innominate artery with thrombus in the false lumen of the innominate artery. No involvement of the descending thoracic aorta. The left main and right coronary arteries not appear to be involved and are patent as are the portions of the LAD and left circumflex. Normal size heart. No significant pericardial effusion/thickening. No central pulmonary embolus. Mediastinum/Nodes: No mediastinal fluid collection. No discrete thyroid nodule. No pathologically enlarged mediastinal, hilar or axillary lymph nodes. The trachea and esophagus are grossly unremarkable. Lungs/Pleura: No suspicious pulmonary nodules or masses. No focal consolidation. No pleural effusion. No pneumothorax. Musculoskeletal: No acute osseous abnormality. Review of the MIP images confirms the above findings. CTA ABDOMEN AND PELVIS FINDINGS VASCULAR Aorta: Aortic atherosclerosis. Normal caliber abdominal aorta without aneurysm, dissection, vasculitis or significant stenosis. Celiac: Patent without evidence of aneurysm, dissection, vasculitis or significant stenosis. SMA: Patent without evidence of aneurysm, dissection, vasculitis or significant stenosis. Renals: Both renal arteries are patent without evidence of aneurysm, dissection, vasculitis, fibromuscular dysplasia or significant stenosis. IMA: Patent without evidence of aneurysm, dissection, vasculitis or significant stenosis. Inflow: Patent without evidence of aneurysm, dissection, vasculitis or significant stenosis. Veins: No obvious venous abnormality within the limitations of this arterial phase study. Review of the MIP images confirms  the above findings. NON-VASCULAR Hepatobiliary: No suspicious hepatic lesion. Gallbladder is unremarkable. No biliary ductal dilation. Pancreas: Within normal limits. Spleen: Within normal limits. Adrenals/Urinary Tract: Adrenal glands are unremarkable. Kidneys are normal, without renal calculi, solid enhancing lesion, or hydronephrosis. Symmetric renal enhancement. Bladder is unremarkable. Stomach/Bowel: Stomach is within normal limits. Appendix appears normal. No evidence of bowel wall thickening, distention, or inflammatory changes. Lymphatic: No pathologically enlarged abdominal or pelvic lymph nodes. Reproductive: Prostate is unremarkable. Other: No abdominopelvic ascites.  No pneumoperitoneum. Musculoskeletal: Mild multilevel degenerative change of the spine. No acute osseous abnormality Review of the MIP images confirms the above findings. IMPRESSION: 1. 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 No involvement of the descending thoracic aorta. No pericardial effusion or mediastinal fluid. 2. No acute findings within the abdomen or pelvis. 3.  Aortic Atherosclerosis (ICD10-I70.0). Electronically Signed   By: Maudry Mayhew MD   On: 11/04/2020 20:22   CT HEAD CODE STROKE WO CONTRAST  Result Date: 11/04/2020 CLINICAL DATA:  Code stroke.  Aphasia EXAM: CT HEAD WITHOUT CONTRAST TECHNIQUE: Contiguous axial images were obtained from the base of the skull through the vertex without intravenous contrast. COMPARISON:  None. FINDINGS: Brain: There is no acute intracranial hemorrhage or mass effect. Question small subtle loss of gray-white differentiation in the right insular region. Ventricles and sulci are normal in size and configuration. Vascular: Question of hyperdensity along distal right M2 or proximal M3 MCA  branch in the posterior sylvian fissure. Skull: Unremarkable. Sinuses/Orbits: No acute abnormality. Other: Mastoid  air cells are clear. ASPECTS [facility] Stroke Program Early CT Score) - Ganglionic level infarction (caudate, lentiform nuclei, internal capsule, insula, M1-M3 cortex): 6 - Supraganglionic infarction (M4-M6 cortex): 3 Total score (0-10 with 10 being normal): 9 IMPRESSION: No acute intracranial hemorrhage. Question subtle small loss of gray-white differentiation in the right insular region (ASPECT score is 9). Possible hyperdense distal right M2 or proximal M3 MCA branch in the posterior sylvian fissure. These results were communicated to Dr. Iver Nestle at 6:45 pm on 11/04/2020 by text page via the [facility] messaging system. Electronically Signed   By: Guadlupe Spanish M.D.   On: 11/04/2020 18:59   VAS [place] UPPER EXTREMITY VENOUS DUPLEX  Result Date: 11/11/2020 UPPER VENOUS STUDY  Patient Name:  CHANCELOR HARDRICK  Date of Exam:   11/11/2020 Medical Rec #: [id number]      Accession #:    [id number] Date of Birth: 1975-10-22     Patient Gender: M Patient Age:   89Y Exam Location:  [facility] Procedure:      VAS [place] UPPER EXTREMITY VENOUS DUPLEX Referring Phys: [id number] Steffanie Dunn --------------------------------------------------------------------------------  Indications: Septic thrombophlebitis Limitations: Patient in restraints, constant movement, unable/unwilling to reposition arm. Comparison Study: No prior studies. Performing Technologist: Jean Rosenthal RDMS,RVT  Examination Guidelines: A complete evaluation includes B-mode imaging, spectral Doppler, color Doppler, and power Doppler as needed of all accessible portions of each vessel. Bilateral testing is considered an integral part of a complete examination. Limited examinations for reoccurring indications may be performed as noted.  Left Findings: +--------+------------+---------+-----------+----------+-------+ LEFT     CompressiblePhasicitySpontaneousPropertiesSummary +--------+------------+---------+-----------+----------+-------+ Brachial    Full                                          +--------+------------+---------+-----------+----------+-------+ Radial      Full                                          +--------+------------+---------+-----------+----------+-------+ Ulnar       Full                                          +--------+------------+---------+-----------+----------+-------+ Cephalic  Partial                                  Acute  +--------+------------+---------+-----------+----------+-------+ Basilic   Partial                Yes               Acute  +--------+------------+---------+-----------+----------+-------+  Summary:  Left: Findings consistent with acute superficial vein thrombosis involving the left basilic vein and left cephalic vein.  *See table(s) above for measurements and observations.  Diagnosing physician: Waverly Ferrari MD Electronically signed by Waverly Ferrari MD on 11/11/2020 at 5:13:28 PM.    Final    CT ANGIO HEAD NECK W WO CM (CODE STROKE)  Result Date: 11/04/2020 CLINICAL DATA:  Code stroke EXAM: CT ANGIOGRAPHY HEAD AND NECK TECHNIQUE: Multidetector CT imaging of the head and neck was performed using the standard protocol during bolus administration of intravenous contrast. Multiplanar CT image reconstructions and MIPs were obtained  to evaluate the vascular anatomy. Carotid stenosis measurements (when applicable) are obtained utilizing NASCET criteria, using the distal internal carotid diameter as the denominator. CONTRAST:  75mL OMNIPAQUE IOHEXOL 350 MG/ML SOLN COMPARISON:  None. FINDINGS: CTA NECK Aortic arch: A dissection flap is present within the included ascending aorta extending into the transverse portion of the arch but not the descending portion. Dissection involves the innominate artery with thrombosis of the false lumen.  This continues into the right common carotid. Left common carotid and left subclavian artery origins are patent. Right carotid system: As noted above, there is extension of dissection into the common carotid with occlusion of the false lumen resulting in narrowing of the true lumen. Minimal diameter of 1.5 mm. The external carotid origin is patent. There is minimal contrast enhancement at the ICA origin and subsequent minimal enhancement along portions of the cervical ICA. Left carotid system: Patent. Trace calcified plaque along the proximal ICA. No stenosis. Vertebral arteries: Patent.  Right vertebral artery is dominant. Skeleton: Degenerative changes of the cervical spine. Other neck: Unremarkable. Upper chest: Included upper lungs are clear. Review of the MIP images confirms the above findings CTA HEAD Anterior circulation: Possible faint enhancement of the intracranial right internal carotid artery. There is reconstitution at the terminus. Right M1 and proximal M2 MCA are patent. There is occlusion of a small right distal M2 and proximal M3 MCA branch corresponding to abnormality on noncontrast CT. Left middle and both anterior cerebral arteries are patent. Anterior communicating artery is present. Posterior circulation: Intracranial vertebral arteries are patent. The left vertebral artery becomes small in caliber after PICA origin. Basilar artery is patent. Major cerebellar artery origins are patent. Posterior cerebral arteries are patent there is a left posterior communicating artery. Venous sinuses: Patent as allowed by contrast bolus timing. 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 This extends into the right common carotid with narrowing of the true lumen to a minimal diameter of 1.5 mm.  Minimal enhancement within the right cervical and intracranial ICA. Reconstitution at the right ICA terminus. No proximal intracranial vessel occlusion. 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 These results were called by telephone at the time of interpretation on 11/04/2020 at 6:59 pm to provider [facility] , who verbally acknowledged these results. Electronically Signed   By: Guadlupe Spanish M.D.   On: 11/04/2020 19:10    Labs:  Basic Metabolic Panel: Recent Labs  Lab 11/07/20 0624 11/07/20 1350 11/07/20 2111 11/08/20 0443 11/08/20 1648 11/09/20 0101 11/10/20 0603 11/11/20 1059 11/12/20 0422 11/13/20 0041  NA 136  --   --  136  --  139 140 140 135 136  K 3.4*  --  3.5 3.2* 4.6 4.1 3.6 3.8 4.5 4.3  CL  --   --   --  102  --  103 104 103 100 101  CO2  --   --   --  29  --  GLUCOSE  --   --   --  112*  --  96 121* 103* 126* 136*  BUN  --   --   --  25*  --  24* 24* 16 17 21*  CREATININE  --   --   --  1.11  --  0.98 0.93 0.89 0.99 1.01  CALCIUM  --   --   --  7.8*  --  7.9* 8.4* 8.7* 8.7* 8.8*  MG  --  2.3 2.2 2.3 2.3  --   --  2.0 2.0 2.2  PHOS  --  3.3 3.3 2.9 2.9  --   --  4.0 4.0 4.5    CBC: Recent Labs  Lab 11/10/20 0603 11/12/20 0422 11/13/20 0041  WBC 5.1 7.0 5.5  NEUTROABS 3.6 5.0  --   HGB 8.7* 10.3* 10.3*  HCT 27.5* 31.5* 31.2*  MCV 103.0* 101.6* 100.3*  PLT 138* 191 189    CBG: Recent Labs  Lab 11/13/20 1622 11/13/20 2008 11/14/20 0008 11/14/20 0401 11/14/20 0431  GLUCAP 76 91 104* 106* 123*    Brief HPI:   Zia Kanner is a 45 y.o. right-handed male with history of tobacco alcohol use as well as anxiety depression.  Presented 11/05/2020 acute onset of aphasia blurred vision altered mental status.  Admission chemistries alcohol negative glucose 112 sodium 133.  Cranial CT scan negative for acute changes question subtle small loss of gray-white differentiation in the  right insular region.  CT angiogram of chest as well as head neck showed partially imaged type aortic dissection involving the ascending aorta and transverse portion but not the descending aorta.  Dissection involved innominate artery with thrombosis of the false lumen.  This extended into the right common carotid with narrowing of the true lumen to a minimal diameter of 1.5 mm.  There was a noted occlusion of small distal right M2 and proximal M3 MCA branch.  Echocardiogram ejection fraction 55 to 60%.  Cardiothoracic surgery consulted for acute ascending aortic dissection underwent emergent repair 11/05/2020 per Dr. Cornelius Moras.  Follow-up cranial CT scan 11/06/2020 showed relatively large infarct with cytotoxic edema in the posterior right MCA/PCA watershed territories.  Mild mass-effect including trace leftward midline shift.  There was a superficial vein thrombosis left arm noted and intravenous heparin initiated 11/12/2020 as well as remaining on low-dose aspirin.  EEG negative for seizure.  Patient remained intubated followed by critical care medicine.  He remained n.p.o. with alternative means of nutritional support.  Bouts of agitation delirium placed on Seroquel and valproic acid.  Therapy evaluations completed and patient was admitted for a comprehensive rehab program.   Hospital Course: Christpher Stogsdill was admitted to rehab 11/13/2020 for inpatient therapies to consist of PT, ST and OT at least three hours five days a week. Past admission physiatrist, therapy team and rehab RN have worked together to provide customized collaborative inpatient rehab.  Pertaining to patient's right MCA with right ICA and M2 occlusion aortic dissection emergent repair 11/05/2020 follow-up per Dr. Cornelius Moras.  He remained on intravenous heparin low-dose aspirin for superficial vein thrombosis.  Nasogastric tube in place n.p.o. alternative means of nutritional support.  Hospital course complicated by mood stabilization agitation restlessness  he continued on Wellbutrin as well as Klonopin with the addition of valproic acid.  He was admitted to inpatient rehab services on the evening of 11/14/2020 awaiting therapy evaluations for a comprehensive rehab.  On the early morning of 11/15/2020 patient found apneic he did have a pulse with blood pressure 72/54 and unresponsive and code was initiated required intubation.  Critical care medicine consulted for discharge to acute care services for ongoing care.   Blood pressures were monitored on TID basis and soft and monitored     Rehab course: During patient's stay in rehab weekly team conferences were  held to monitor patient's progress, set goals and discuss barriers to discharge. At admission, patient required  He/She  has had improvement in activity tolerance, balance, postural control as well as ability to compensate for deficits. He/She has had improvement in functional use RUE/LUE  and RLE/LLE as well as improvement in awareness.  Therapies were yet to be initiated before patient coded.       Disposition: Discharged to acute care services    Diet: N.p.o.  Special Instructions:  Medications at discharge as per critical care medicine.      Signed: Mcarthur RossettiDaniel J Maliah Pyles 11/14/2020, 5:16 AM

## 2020-11-14 NOTE — Progress Notes (Signed)
RT note Pt transported to 2H with no complications.

## 2020-11-14 NOTE — Progress Notes (Signed)
Pharmacy Antibiotic Note  Jesse [name] is a 45 y.o. male admitted on 11/14/2020 with sepsis.  Patient admitted to CVICU with stroke, went to IR and became hypotensive with fever. Transferred back to CVICU. Pharmacy has been consulted for vancomycin and cefepime dosing.  Plan: Vancomycin load 20-25 mg/kg (1250 mg x1).  Then vancomycin 750 mg q12h. Goal AUC 400-600. (Dosing weight TBW, Vd 0.72, Scr 1.01). Calculated AUC 540.  Ordered MRSA pcr.   Cefepime 2 g q8h per CrCl 74.75ml/hr.    Temp (24hrs), Avg:98.8 F (37.1 C), Min:97.9 F (36.6 C), Max:103.3 F (39.6 C)  Recent Labs  Lab 11/09/20 0101 11/10/20 0603 11/11/20 1059 11/12/20 0422 11/13/20 0041  WBC  --  5.1  --  7.0 5.5  CREATININE 0.98 0.93 0.89 0.99 1.01    Estimated Creatinine Clearance: 74.8 mL/min (by C-G formula based on SCr of 1.01 mg/dL).    No Known Allergies  Antimicrobials this admission: Keflex 7/19 >> 7/20 Vanco 7/19 >> 7/20, 7/22>> Cefepime 7/22 >>  Dose adjustments this admission: Prior vancomycin 1500mg  x1 then 1250 mg IV Q 12 hrs. Goal AUC 400-550. Expected AUC: 550 SCr used: 0.89 No prior troughs as never at steady state  Microbiology results: 7/19 BCx: ngtd 7/12, 7/16 Sputum: ngtd   Thank you for allowing pharmacy to be a part of this patient's care.  8/16 A Jenessa Gillingham 11/14/2020 8:56 AM

## 2020-11-14 NOTE — Progress Notes (Signed)
Patient transferred to unit 2 heart, room 12, report given to RN Rogene Houston.

## 2020-11-14 NOTE — H&P (Signed)
NAME:  Jesse [name], MRN:  [id number], DOB:  [birth year], LOS: 0 ADMISSION DATE:  11/14/2020 CONSULTATION DATE:  11/14/2020 REFERRING MD:  Hermelinda Medicus - Rehab CHIEF COMPLAINT:  Post-Code, respiratory arrest   History of Present Illness:  45 year old man with PMHx significant for anxiety, depression, tobacco and EtOH use initially admitted to [facility] 7/12 for chest pain radiating to his neck. Symptoms worsened to include acute receptive/expressive aphasia, unilateral blurry vision in R eye and AMS. Code Stroke called, initial CT Head negative; however, CTA revealed acute dissection involving the aortic arch, through the innominate artery and R common carotid artery.   He was taken emergently to the OR 7/12 for repair. Intubated for procedure, extubated same day. Given aphasia at the time of presentation, Neuro was consulted postoperatively for possible CVA post-dissection. 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 The following day (7/15), patient had increasing O2 requirements and was reintubated (extubated to HFNC 7/16). Postoperative course was additionally complicated by coagulopathy, L antecubital superficial venous thrombus and persistent hyperactive delirium (requiring Precedex). He was transferred [facility] 7/21.  On 7/22 on 0400 vitals check, patient was noted to be hypotensive to MAPs 50s and tachypneic to 40s. Per patient's wife, she was present at bedside until around 0230, but notes that patient appeared "different" after last PM medications were given (~2100 7/21). He began to breathe agonally and a Code Blue was called. Per RRT/Code Team, patient did not lose a pulse or require compressions. Patient was reintubated and low dose Levo was initiated.  PCCM consulted for ICU admission post-code.  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 Intubated, IO placed. Low dose Levo initiated. Transferred to 2H.  Interim History / Subjective:  Called to bedside post-code  Objective:  There were no vitals taken for this visit.    Vent Mode: PRVC FiO2 (%):  [100 %] 100 % Set Rate:  [24 bmp] 24 bmp Vt Set:  [650 mL] 650 mL PEEP:  [5 cmH20] 5 cmH20 Plateau Pressure:  [16 cmH20] 16 cmH20  No intake or output data in the 24 hours ending 11/14/20 0604 There were no vitals filed for this visit.  Physical Examination: General: Chronically ill-appearing middle-aged man in NAD. HEENT: [place]/AT, anicteric sclera, PERRL, dry mucous membranes. Neuro:  Minimally responsive  Withdraws to pain in upper extremities. Not following commands. +Cough and +Gag  CV: RRR, no m/g/r. PULM: Breathing even and unlabored on vent (PEEP 5, FiO2 100%). Lung fields with scattered rhonchi. GI: Soft, nontender, nondistended. Normoactive bowel sounds. Extremities: No LE edema noted. Skin: Cool/dry, no rashes.  Resolved Hospital Problem List:    Assessment & Plan:  Acute hypoxic respiratory failure in the setting of stroke Respiratory suppression likely secondary to BZD administration Post-Code Blue 7/22AM for respiratory arrest; patient never lost pulse. Intubated initially 7/12 (extubated same day), reintubated 7/15 (extubated next day) and reintubated 7/22 during Code. - Continue full  vent support (4-8cc/kg IBW) - Wean FiO2 for O2 sat > 90% - Daily WUA/SBT - VAP bundle - Pulmonary hygiene - PAD  protocol for sedation: Minimize sedation as able, given likely component of BZDs in hypotension - F/u CXR - F/u ABG  Acute R MCA/PCA territory infarct Presented with expressive/receptive aphasia. CT Head 7/14 demonstrating R MCA/PCA territory infarct. - Consider Neuro reconsult, given change in mental status in the setting of code/less LE movement - Consider repeat CT Head - Frequent neurologic checks - Neuroprotective measures: HOB > 30 degrees, normoglycemia, normothermia, electrolytes WNL  Acute encephalopathy secondary to respiratory failure, stroke History of agitated delirium CT Head initially negative for stroke, most recent 7/20 demonstrating R petechial hemorrhage and evolving posterior R MCA/PCA watershed territory infarct - Concern for possible oversedation with BZD - Administer Romazicon - Hold further BZD doses at present - Continue valproic acid, hold Seroquel - Stroke management as above - Ensure electrolytes WNL - Limit sedation as able  Acute aortic dissection CTA 7/12 demonstrating aortic dissection involving aortic arch, innominate artery and R common carotid artery. S/p OR repair with TCTS 7/12. - Consider additional imaging if concern for code sequelae - Monitor BP/hemodynamics closely - Goal SBP < 160 - Hold antihypertensives in the setting of hypotension, resume as appropriate  Left brachial superficial venous thrombosis - Previously on heparin gtt - Resumption as appropriate, consider head reimaging/dissection and stroke reevaluation prior, given Code Blue  History of anxiety History of tobacco use disorder History of EtOH use - Continue Wellbutrin - Recommend cessation of tobacco/EtOH - MV  Dysphagia Deconditioning Severe protein calorie malnutrition - Resume Cortrak feeds as appropriate - Supplementation with thiamine, folate, MV - PT/OT/SLP once appropriate from clinical standpoint.  Best Practice: (right click and "Reselect all SmartList  Selections" daily)   Diet/type: NPO DVT prophylaxis: SCD GI prophylaxis: PPI Lines: N/A Foley:  N/A Code Status:  full code Last date of multidisciplinary goals of care discussion [Pending]  Labs:  CBC: Recent Labs  Lab 11/07/20 0624 11/10/20 0603 11/12/20 0422 11/13/20 0041  WBC  --  5.1 7.0 5.5  NEUTROABS  --  3.6 5.0  --   HGB 7.8* 8.7* 10.3* 10.3*  HCT 23.0* 27.5* 31.5* 31.2*  MCV  --  103.0* 101.6* 100.3*  PLT  --  138* 191 189   Basic Metabolic Panel: Recent Labs  Lab 11/08/20 0443 11/08/20 1648 11/09/20 0101 11/10/20 0603 11/11/20 1059 11/12/20 0422 11/13/20 0041  NA 136  --  139 140 140 135 136  K 3.2* 4.6 4.1 3.6 3.8 4.5 4.3  CL 102  --  103 104 103 100 101  CO2 29  --  30 26 28 27 26   GLUCOSE 112*  --  96 121* 103* 126* 136*  BUN 25*  --  24* 24* 16 17 21*  CREATININE 1.11  --  0.98 0.93 0.89 0.99 1.01  CALCIUM 7.8*  --  7.9* 8.4* 8.7* 8.7* 8.8*  MG 2.3 2.3  --   --  2.0 2.0 2.2  PHOS 2.9 2.9  --   --  4.0 4.0 4.5   GFR: Estimated Creatinine Clearance: 74.8 mL/min (by C-G formula based on SCr of 1.01 mg/dL). Recent Labs  Lab 11/10/20 0603 11/12/20 0422 11/13/20 0041  WBC 5.1 7.0 5.5   Liver Function Tests: No results for input(s): AST, ALT, ALKPHOS, BILITOT, PROT, ALBUMIN in the last 168 hours. No results for input(s): LIPASE, AMYLASE in the last 168 hours. No results for  input(s): AMMONIA in the last 168 hours.  ABG:    Component Value Date/Time   PHART 7.361 11/07/2020 0624   PCO2ART 48.9 (H) 11/07/2020 0624   PO2ART 57 (L) 11/07/2020 0624   HCO3 27.7 11/07/2020 0624   TCO2 29 11/07/2020 0624   ACIDBASEDEF 1.0 11/05/2020 1305   O2SAT 88.0 11/07/2020 0624    Coagulation Profile: No results for input(s): INR, PROTIME in the last 168 hours.  Cardiac Enzymes: No results for input(s): CKTOTAL, CKMB, CKMBINDEX, TROPONINI in the last 168 hours.  HbA1C: Hgb A1c MFr Bld  Date/Time Value Ref Range Status  11/06/2020 05:10 AM 5.2 4.8 -  5.6 % Final    Comment:    (NOTE) Pre diabetes:          5.7%-6.4%  Diabetes:              >6.4%  Glycemic control for   <7.0% adults with diabetes    CBG: Recent Labs  Lab 11/13/20 1622 11/13/20 2008 11/14/20 0008 11/14/20 0401 11/14/20 0431  GLUCAP 76 91 104* 106* 123*   Review of Systems:   Patient is encephalopathic and/or intubated. Therefore history has been obtained from chart review.   Past Medical History:  He,  has a past medical history of Anxiety, Depression, Essential hypertension, and S/P aortic dissection repair (11/05/2020).   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 WITH REPLACEMENT OF ASCENDING AORTA AND HEMIARCH USING HEMASHIELD PLATINUM GRAFT AND HEMASHIELD GOLD 14 X GRAFT, RESUSPENSION OF NATIVE VALVE, AORTA TO RIGHT CAROTID BYPASS, AORTA TO RIGHT SUBCLAVIAN BYPASS;  Surgeon: Purcell Nails, MD;  Location: [facility] OR;  Service: Open Heart Surgery;  Laterality: N/A;   TEE WITHOUT CARDIOVERSION N/A 11/04/2020   Procedure: TRANSESOPHAGEAL ECHOCARDIOGRAM (TEE);  Surgeon: Purcell Nails, MD;  Location: [facility] OR;  Service: Open Heart Surgery;  Laterality: N/A;    Social History:   reports that he has been smoking cigarettes. He has never used smokeless tobacco. He reports current alcohol use of about 2.0 standard drinks of alcohol per week. He reports previous drug use.   Family History:  His family history is not on file.   Allergies: No Known Allergies   Home Medications: Prior to Admission medications   Medication Sig Start Date End Date Taking? Authorizing Provider  amLODipine (NORVASC) 2.5 MG tablet Place 1 tablet (2.5 mg total) into feeding tube daily. 11/14/20   Steffanie Dunn, DO  aspirin 81 MG chewable tablet Place 1 tablet (81 mg total) into feeding tube daily. 11/14/20   Steffanie Dunn, DO  bisacodyl  (DULCOLAX) 10 MG suppository Place 1 suppository (10 mg total) rectally daily. 11/14/20   Steffanie Dunn, DO  bisacodyl (DULCOLAX) 5 MG EC tablet Take 2 tablets (10 mg total) by mouth daily. 11/14/20   Steffanie Dunn, DO  buPROPion (WELLBUTRIN) 75 MG tablet Place 1 tablet (75 mg total) into feeding tube 2 (two) times daily. 11/13/20   Steffanie Dunn, DO  chlorhexidine (PERIDEX) 0.12 % solution 15 mLs by Mouth Rinse route 2 (two) times daily. 11/13/20   Steffanie Dunn, DO  clonazePAM (KLONOPIN) 2 MG disintegrating tablet Place 1 tablet (2 mg total) into feeding tube every 8 (eight) hours. 11/13/20   Steffanie Dunn, DO  docusate (COLACE) 50 MG/5ML liquid Place 10 mLs (100 mg total) into feeding tube  2 (two) times daily. 11/13/20   Steffanie Dunnlark, Laura P, DO  folic acid (FOLVITE) 1 MG tablet Place 1 tablet (1 mg total) into feeding tube daily. 11/14/20   Steffanie Dunnlark, Laura P, DO  heparin 1610925000 UT/250ML infusion Inject 1,550 Units/hr into the vein continuous. 11/13/20   Steffanie Dunnlark, Laura P, DO  hydrALAZINE (APRESOLINE) 20 MG/ML injection Inject 0.5 mLs (10 mg total) into the vein every 6 (six) hours as needed. 11/13/20   Steffanie Dunnlark, Laura P, DO  labetalol (NORMODYNE) 200 MG tablet Place 1 tablet (200 mg total) into feeding tube 3 (three) times daily. 11/13/20   Steffanie Dunnlark, Laura P, DO  labetalol (NORMODYNE) 5 MG/ML injection Inject 2 mLs (10 mg total) into the vein every hour as needed (for MAP > 95). 11/13/20   Karie Fetchlark, Laura P, DO  LORazepam (ATIVAN) 2 MG/ML injection Inject 0.5-1 mLs (1-2 mg total) into the vein every 2 (two) hours as needed for sedation. 11/13/20   Karie Fetchlark, Laura P, DO  melatonin 5 MG TABS Place 1 tablet (5 mg total) into feeding tube at bedtime as needed. 11/13/20   Steffanie Dunnlark, Laura P, DO  Mouthwashes (MOUTH RINSE) LIQD solution 15 mLs by Mouth Rinse route 2 (two) times daily. 11/13/20   Steffanie Dunnlark, Laura P, DO  Multiple Vitamin (MULTIVITAMIN) LIQD Place 15 mLs into feeding tube daily. 11/14/20   Steffanie Dunnlark, Laura P, DO  Nutritional Supplements  (FEEDING SUPPLEMENT, OSMOLITE 1.5 CAL,) LIQD Place 1,000 mLs into feeding tube continuous. 11/13/20   Steffanie Dunnlark, Laura P, DO  Nutritional Supplements (FEEDING SUPPLEMENT, PROSOURCE TF,) liquid Place 45 mLs into feeding tube 3 (three) times daily. 11/13/20   Steffanie Dunnlark, Laura P, DO  polyethylene glycol (MIRALAX / GLYCOLAX) 17 g packet Place 17 g into feeding tube daily. 11/14/20   Steffanie Dunnlark, Laura P, DO  predniSONE 5 MG/5ML solution Place 10 mLs (10 mg total) into feeding tube daily with breakfast. 11/14/20   Karie Fetchlark, Laura P, DO  QUEtiapine (SEROQUEL) 25 MG tablet Place 1 tablet (25 mg total) into feeding tube 2 (two) times daily. 11/13/20   Steffanie Dunnlark, Laura P, DO  thiamine 100 MG tablet Place 1 tablet (100 mg total) into feeding tube daily. 11/14/20   Steffanie Dunnlark, Laura P, DO  valproic acid (DEPAKENE) 250 MG/5ML solution Place 5 mLs (250 mg total) into feeding tube 3 (three) times daily. 11/13/20   Steffanie Dunnlark, Laura P, DO  Water For Irrigation, Sterile (FREE WATER) SOLN Place 100 mLs into feeding tube every 8 (eight) hours. 11/13/20   Steffanie Dunnlark, Laura P, DO    Critical care time: 4546 minutes   Tim LairStephanie M Jacobe Study, [place] JerseyPA-C [facility] 11/14/20 6:04 AM  Please see [email or web address] for pager details.  From 7A-7P if no response, please call [phone] After hours, please call ELink [phone]

## 2020-11-14 NOTE — Progress Notes (Signed)
   11/14/20 0400  Clinical Encounter Type  Visited With Patient not available  Visit Type Code  Referral From Nurse  Consult/Referral To Chaplain  The chaplain responded to page for code. Patient will be transferred to an ICU in the hospital. No family is present. The charge nurse called the wife and she is on her way back to the hospital. The chaplain will follow up as needed to assist.

## 2020-11-14 NOTE — Progress Notes (Signed)
Initial Nutrition Assessment  DOCUMENTATION CODES:   Severe malnutrition in context of chronic illness, Underweight  INTERVENTION:   Initiate tube feeding via Cortrak: Osmolite 1.5 at 60 ml/h (1440 ml per day) Prosource TF 45 ml TID  Provides 2280 kcal, 123 gm protein, 1097 ml free water daily  NUTRITION DIAGNOSIS:   Severe Malnutrition related to chronic illness (anxiety, HTN, ETOH use) as evidenced by severe muscle depletion, severe fat depletion.  GOAL:   Patient will meet greater than or equal to 90% of their needs  MONITOR:   TF tolerance, Labs, Skin, Vent status  REASON FOR ASSESSMENT:   Ventilator    ASSESSMENT:   45 yo male with recent hospitalization 7/12-7/21 for R MCA stroke. Admitted to CIR on 7/21. Transferred from CIR to ICU on 7/22 S/P respiratory arrest requiring intubation. PMH includes anxiety, depression, HTN, ETOH use, tobacco use.  Cortrak placed last admission on 7/16 and TF was initiated.  Patient has been receiving Osmolite 1.5 TF via Cortrak.  No tolerance issues noted.  Per most recent abdominal x-ray on 7/17, tip of Cortrak is gastric.   Spoke with MD; okay to resume TF via Cortrak. Per RN, patient may be in tamponade, so will hold off on initiating TF at this time as he may require surgery.   Patient is currently intubated on ventilator support MV: 11.2 L/min Temp (24hrs), Avg:100.8 F (38.2 C), Min:97.9 F (36.6 C), Max:103.3 F (39.6 C)   Labs reviewed. K 6.2-->5.3, phos 4.9, mag 2.5 CBG: 109-148  Medications reviewed and include levophed, precedex, calcium gluconate.  NUTRITION - FOCUSED PHYSICAL EXAM:  Flowsheet Row Most Recent Value  Orbital Region Severe depletion  Upper Arm Region Severe depletion  Thoracic and Lumbar Region Severe depletion  Buccal Region Severe depletion  Temple Region Severe depletion  Clavicle Bone Region Severe depletion  Clavicle and Acromion Bone Region Severe depletion  Scapular Bone Region  Unable to assess  Dorsal Hand Severe depletion  Patellar Region Severe depletion  Anterior Thigh Region Severe depletion  Posterior Calf Region Severe depletion  Edema (RD Assessment) None  Hair Reviewed  Eyes Unable to assess  Mouth Unable to assess  Skin Reviewed  Nails Reviewed       Diet Order:   Diet Order             Diet NPO time specified  Diet effective now                   EDUCATION NEEDS:   Not appropriate for education at this time  Skin:  Skin Assessment: Skin Integrity Issues: Skin Integrity Issues:: Incisions Incisions: chest, groin  Last BM:  7/22 type 6  Height:   Ht Readings from Last 1 Encounters:  11/13/20 6\' 2"  (1.88 m)    Weight:   Wt Readings from Last 1 Encounters:  11/13/20 57.9 kg    BMI:  16.4   Estimated Nutritional Needs:   Kcal:  2100-2300  Protein:  115-130 gm  Fluid:  >/= 2 L    11/15/20, RD, LDN, CNSC Please refer to Amion for contact information.

## 2020-11-14 NOTE — Progress Notes (Signed)
RT NOTE: RT transported patient on ventilator from room 2H12 to CT and back to room 2H12 with no complications. Vitals are stable. RT will continue to monitor.  

## 2020-11-14 NOTE — Code Documentation (Signed)
  Patient Name: Jesse [name]   MRN: [id number]   Date of Birth/ Sex: Dec 25, 1975 , male      Admission Date: 11/13/2020  Attending Provider: Ranelle Oyster, MD  Primary Diagnosis: Acute ischemic right MCA stroke [facility])    Indication: Pt was in his usual state of health until this AM, when he was noted to be hypotensive and slow to respond. Code blue was subsequently called. At the time of arrival on scene, ACLS protocol was not underway because patient had a strong femoral pulse.    Technical Description:  - CPR performance duration:  Not performed  - Was defibrillation or cardioversion used? No   - Was external pacer placed? No  - Was patient intubated pre/post CPR? Yes   Medications Administered: Y = Yes; Blank = No Amiodarone    Atropine    Calcium    Epinephrine    Lidocaine    Magnesium    Norepinephrine  y  Phenylephrine    Sodium bicarbonate    Vasopressin     Post CPR evaluation:  - Final Status - Was patient successfully resuscitated ? Yes - What is current rhythm? Sinus  - What is current hemodynamic status? Hemodynamically stable on 10 mcg Levo.   Miscellaneous Information:  - Labs sent, including: none  - Primary team notified?  Yes  - Family Notified? Yes, wife came bedside.   - Additional notes/ transfer status: CODE BLUE was called for concern patient was decompensating.  Patient was never pulseless.  On my arrival patient had a good femoral pulse but was having agonal breathing.  Patient was intubated.  Review of vitals showed this was an acute drop in BP. Patient was on heparin and it was stopped in setting of low blood pressure and concern internal bleeding. Patient given 1 L IV fluids and then started on Levo. Second bag of fluids started. IO line placed by RN during code because patient needed additional access. Primary team was notified and came bedside. PCCM to accept patient and patient will be transferred to [facility] ICU. Patient signed out to PCCM PA.       Thurmon Fair, MD PGY3 Internal Medicine [phone]

## 2020-11-14 NOTE — Progress Notes (Signed)
Critical ABG value reported to MD.

## 2020-11-14 NOTE — Procedures (Signed)
Central Venous Catheter Insertion Procedure Note  Jesse [name]  [id number]  22-Jun-1975  Date:11/14/20  Time:8:57 AM   Provider Performing:Gabrella Stroh D Suzie Portela   Procedure: Insertion of Non-tunneled Central Venous [phone]) with US guidance ([id number])     Indication(s) Medication administration and Difficult access  Consent Risks of the procedure as well as the alternatives and risks of each were explained to the patient and/or caregiver.  Consent for the procedure was obtained and is signed in the bedside chart  Anesthesia Topical only with 1% lidocaine   Timeout Verified patient identification, verified procedure, site/side was marked, verified correct patient position, special equipment/implants available, medications/allergies/relevant history reviewed, required imaging and test results available.  Sterile Technique Maximal sterile technique including full sterile barrier drape, hand hygiene, sterile gown, sterile gloves, mask, hair covering, sterile ultrasound probe cover (if used).  Procedure Description Area of catheter insertion was cleaned with chlorhexidine and draped in sterile fashion.  With real-time ultrasound guidance a central venous catheter was placed into the right internal jugular vein. Nonpulsatile blood flow and easy flushing noted in all ports.  The catheter was sutured in place and sterile dressing applied.  Complications/Tolerance None; patient tolerated the procedure well. Chest X-ray is ordered to verify placement for internal jugular or subclavian cannulation.   Chest x-ray is not ordered for femoral cannulation.  EBL Minimal  Specimen(s) None  JD Anselm Lis [facility] 11/14/2020, 8:58 AM  Please see [email or web address] for pager details.  From 7A-7P if no response, please call [phone]. After hours, please call ELink [phone].

## 2020-11-14 NOTE — Progress Notes (Signed)
Patient transferred to  2 Heart,escorted by RR,RT and spouse,

## 2020-11-14 NOTE — Progress Notes (Signed)
   11/14/20 0600  Clinical Encounter Type  Visited With Family  Visit Type Code  Referral From Nurse  Consult/Referral To Chaplain  Spiritual Encounters  Spiritual Needs Emotional;Prayer  The chaplain waited with the wife while the doctors continued to work on the patient. The chaplain escorted the wife down to 2H. The chaplain provided active listening and social support to the patient. The wife was very emotional and spoke to the chaplain about the events leading up to her husband coming to the hospital. She and her husband have been married more than 20 years and have 3 children. The chaplain prayed with the wife prior to leaving and will follow up as needed.

## 2020-11-14 NOTE — Progress Notes (Addendum)
During morning Vitals around 4 am  patient's  BP noted 74/51, rechecked still showed low. Patient responded slower then before, immediately RRT called, charge nurse notified. Code called, MD suggested to stop heparin drip during code.

## 2020-11-14 NOTE — Progress Notes (Signed)
NAME:  Jesse [name], MRN:  [id number], DOB:  [birth year], LOS: 0 ADMISSION DATE:  11/14/2020 CONSULTATION DATE:  11/14/2020 REFERRING MD:  Hermelinda Medicus - Rehab CHIEF COMPLAINT:  Post-Code, respiratory arrest   History of Present Illness:  45 year old man with PMHx significant for anxiety, depression, tobacco and EtOH use initially admitted to [facility] 7/12 for chest pain radiating to his neck. Symptoms worsened to include acute receptive/expressive aphasia, unilateral blurry vision in R eye and AMS. Code Stroke called, initial CT Head negative; however, CTA revealed acute dissection involving the aortic arch, through the innominate artery and R common carotid artery.   He was taken emergently to the OR 7/12 for repair. Intubated for procedure, extubated same day. Given aphasia at the time of presentation, Neuro was consulted postoperatively for possible CVA post-dissection. 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 The following day (7/15), patient had increasing O2 requirements and was reintubated (extubated to HFNC 7/16). Postoperative course was additionally complicated by coagulopathy, L antecubital superficial venous thrombus and persistent hyperactive delirium (requiring Precedex). He was transferred to [facility] 7/21.  On 7/22 on 0400 vitals check, patient was noted to be hypotensive to MAPs 50s and tachypneic to 40s. Per patient's wife, she was present at bedside until around 0230, but notes that patient appeared "different" after last PM medications were given (~2100 7/21). He began to breathe agonally and a Code Blue was called. Per RRT/Code Team, patient did not lose a pulse or require compressions. Patient was reintubated and low dose Levo was initiated.  PCCM consulted for ICU admission post-code.  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 Intubated, IO placed. Low dose Levo initiated. Transferred to 2H. Central line placed.  Interim History / Subjective:  On levophed 10. Central line placed. O2 sat in high 90s with FiO2 50% and PEEP 5.   Objective:  Blood pressure 108/65, pulse 92, temperature (!) 103.3 F (39.6 C), temperature source Oral, resp. rate (!) 29, SpO2 97 %.    Vent Mode: PRVC FiO2 (%):  [50 %-100 %] 50 % Set Rate:  [20 bmp-24 bmp] 20 bmp Vt Set:  [500 mL-650 mL] 500 mL PEEP:  [5 cmH20] 5 cmH20 Plateau Pressure:  [16 cmH20] 16 cmH20  No intake or output data in the 24 hours ending 11/14/20 0854 There were no vitals filed for this visit.  Physical Examination: General: Chronically ill-appearing middle-aged man, intubated and sedated HEENT: [place]/AT, anicteric sclera, pinpoint pupils Neuro: sedated CV: Heart sounds distant. Pedal pulses +2 PULM: Breathing even and unlabored on vent (PEEP 5, FiO2 100%). CTAB on anterior exam. GI: Soft, nontender, nondistended. Normoactive bowel sounds. Extremities: No LE edema noted. Upper and lower extremities warm Skin: no rashes. No erythema or firmness to palpation in L antecubital fossa  Resolved Hospital Problem List:    Assessment & Plan:  Acute hypoxic respiratory failure in the setting of stroke Respiratory suppression; BZD administration  contributing  Post-Code Blue 7/22AM for respiratory arrest; patient never lost pulse. Intubated initially 7/12 (extubated same day), reintubated 7/15 (extubated next day)  and reintubated 7/22 during Code. - Continue full vent support (4-8cc/kg IBW) - Wean FiO2 for O2 sat > 90% - Daily WUA/SBT - VAP bundle - Pulmonary hygiene - PAD protocol for sedation: Minimize sedation as able, given likely component of BZDs in hypotension  Fever Pt with Tmax 39.6 this AM. Fever could be a due increased shift 2/2 cytotoxic edema in setting of stroke vs. Infectious vs. Subclinical seizures.  -Follow-up blood cultures -Follow-up labs -Repeat head CT -EEG -Start broad spectrum abx, vanc and cefepime -Follow-up CXR  Acute R MCA/PCA territory infarct Presented with expressive/receptive aphasia. CT Head 7/14 demonstrating R MCA/PCA territory infarct. - Head CT as above - Frequent neurologic checks - Neuroprotective measures: HOB > 30 degrees, normoglycemia, normothermia, electrolytes WNL  Acute encephalopathy secondary to respiratory failure, stroke History of agitated delirium CT Head initially negative for stroke, most recent 7/20 demonstrating R petechial hemorrhage and evolving posterior R MCA/PCA watershed territory infarct - Concern for possible oversedation with BZD, s/p Romazicon x1 dose - Hold further BZD doses at present - On fentanyl for sedation - Stroke management as above  Acute aortic dissection CTA 7/12 demonstrating aortic dissection involving aortic arch, innominate artery and R common carotid artery. S/p OR repair with TCTS 7/12. - Consider additional imaging if concern for code sequelae - Monitor BP/hemodynamics closely - Goal SBP < 160 - Hold antihypertensives in the setting of hypotension, resume as appropriate  Left brachial superficial venous thrombosis - Previously on heparin gtt - Consider resumption following head imaging results  History of anxiety History of tobacco use disorder History of EtOH use - Continue Wellbutrin - Recommend cessation of tobacco/EtOH - MV  Dysphagia Deconditioning Severe protein calorie malnutrition -  Resume Cortrak feeds as appropriate - Supplementation with thiamine, folate, MV - PT/OT/SLP once appropriate from clinical standpoint.  Best Practice: (right click and "Reselect all SmartList Selections" daily)   Diet/type: NPO DVT prophylaxis: SCD GI prophylaxis: PPI Lines: N/A Foley:  N/A Code Status:  full code Last date of multidisciplinary goals of care discussion [Pending]  Labs:  CBC: Recent Labs  Lab 11/10/20 0603 11/12/20 0422 11/13/20 0041 11/14/20 0609  WBC 5.1 7.0 5.5  --   NEUTROABS 3.6 5.0  --   --   HGB 8.7* 10.3* 10.3* 9.9*  HCT 27.5* 31.5* 31.2* 29.0*  MCV 103.0* 101.6* 100.3*  --   PLT 138* 191 189  --     Basic Metabolic Panel: Recent Labs  Lab 11/08/20 0443 11/08/20 1648 11/09/20 0101 11/10/20 0603 11/11/20 1059 11/12/20 0422 11/13/20 0041 11/14/20 0609  NA 136  --  139 140 140 135 136 135  K 3.2* 4.6 4.1 3.6 3.8 4.5 4.3 6.6*  CL 102  --  103 104 103 100 101  --   CO2 29  --  30 26 28 27 26   --   GLUCOSE 112*  --  96 121* 103* 126* 136*  --   BUN 25*  --  24* 24* 16 17 21*  --   CREATININE 1.11  --  0.98 0.93 0.89 0.99 1.01  --   CALCIUM 7.8*  --  7.9* 8.4* 8.7* 8.7* 8.8*  --   MG 2.3 2.3  --   --  2.0 2.0 2.2  --   PHOS 2.9 2.9  --   --  4.0 4.0 4.5  --  GFR: Estimated Creatinine Clearance: 74.8 mL/min (by C-G formula based on SCr of 1.01 mg/dL). Recent Labs  Lab 11/10/20 0603 11/12/20 0422 11/13/20 0041  WBC 5.1 7.0 5.5    Liver Function Tests: No results for input(s): AST, ALT, ALKPHOS, BILITOT, PROT, ALBUMIN in the last 168 hours. No results for input(s): LIPASE, AMYLASE in the last 168 hours. No results for input(s): AMMONIA in the last 168 hours.  ABG:    Component Value Date/Time   PHART 7.458 (H) 11/14/2020 0609   PCO2ART 31.0 (L) 11/14/2020 0609   PO2ART 162 (H) 11/14/2020 0609   HCO3 22.0 11/14/2020 0609   TCO2 23 11/14/2020 0609   ACIDBASEDEF 1.0 11/14/2020 0609   O2SAT 100.0 11/14/2020 0609      Coagulation Profile: No results for input(s): INR, PROTIME in the last 168 hours.  Cardiac Enzymes: No results for input(s): CKTOTAL, CKMB, CKMBINDEX, TROPONINI in the last 168 hours.  HbA1C: Hgb A1c MFr Bld  Date/Time Value Ref Range Status  11/06/2020 05:10 AM 5.2 4.8 - 5.6 % Final    Comment:    (NOTE) Pre diabetes:          5.7%-6.4%  Diabetes:              >6.4%  Glycemic control for   <7.0% adults with diabetes    CBG: Recent Labs  Lab 11/13/20 2008 11/14/20 0008 11/14/20 0401 11/14/20 0431 11/14/20 0819  GLUCAP 91 104* 106* 123* 109*    Review of Systems:   Patient is encephalopathic and/or intubated. Therefore history has been obtained from chart review.   Past Medical History:  He,  has a past medical history of Anxiety, Depression, Essential hypertension, and S/P aortic dissection repair (11/05/2020).   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 WITH REPLACEMENT OF ASCENDING AORTA AND HEMIARCH USING HEMASHIELD PLATINUM 26MM GRAFT AND HEMASHIELD GOLD 14 X 8MM GRAFT, RESUSPENSION OF NATIVE VALVE, AORTA TO RIGHT CAROTID BYPASS, AORTA TO RIGHT SUBCLAVIAN BYPASS;  Surgeon: Purcell Nailswen, Clarence H, MD;  Location: [facility] OR;  Service: Open Heart Surgery;  Laterality: N/A;   TEE WITHOUT CARDIOVERSION N/A 11/04/2020   Procedure: TRANSESOPHAGEAL ECHOCARDIOGRAM (TEE);  Surgeon: Purcell Nailswen, Clarence H, MD;  [facility];  Service: Open Heart Surgery;  Laterality: N/A;    Social History:   reports that he has been smoking cigarettes. He has never used smokeless tobacco. He reports current alcohol use of about 2.0 standard drinks of alcohol per week. He reports previous drug use.   Family History:  His family history is not on file.   Allergies: No Known Allergies   Home Medications: Prior to Admission medications   Medication Sig Start Date End  Date Taking? Authorizing Provider  amLODipine (NORVASC) 2.5 MG tablet Place 1 tablet (2.5 mg total) into feeding tube daily. 11/14/20   Steffanie Dunnlark, Laura P, DO  aspirin 81 MG chewable tablet Place 1 tablet (81 mg total) into feeding tube daily. 11/14/20   Steffanie Dunnlark, Laura P, DO  bisacodyl (DULCOLAX) 10 MG suppository Place 1 suppository (10 mg total) rectally daily. 11/14/20   Steffanie Dunnlark, Laura P, DO  bisacodyl (DULCOLAX) 5 MG EC tablet Take 2 tablets (10 mg total) by mouth daily. 11/14/20   Steffanie Dunnlark, Laura P, DO  buPROPion (WELLBUTRIN) 75 MG tablet Place 1 tablet (75 mg total) into feeding tube 2 (two) times daily. 11/13/20   Chestine Sporelark,  Vernona Rieger P, DO  chlorhexidine (PERIDEX) 0.12 % solution 15 mLs by Mouth Rinse route 2 (two) times daily. 11/13/20   Steffanie Dunn, DO  clonazePAM (KLONOPIN) 2 MG disintegrating tablet Place 1 tablet (2 mg total) into feeding tube every 8 (eight) hours. 11/13/20   Steffanie Dunn, DO  docusate (COLACE) 50 MG/5ML liquid Place 10 mLs (100 mg total) into feeding tube 2 (two) times daily. 11/13/20   Steffanie Dunn, DO  folic acid (FOLVITE) 1 MG tablet Place 1 tablet (1 mg total) into feeding tube daily. 11/14/20   Steffanie Dunn, DO  heparin 76546 UT/250ML infusion Inject 1,550 Units/hr into the vein continuous. 11/13/20   Steffanie Dunn, DO  hydrALAZINE (APRESOLINE) 20 MG/ML injection Inject 0.5 mLs (10 mg total) into the vein every 6 (six) hours as needed. 11/13/20   Steffanie Dunn, DO  labetalol (NORMODYNE) 200 MG tablet Place 1 tablet (200 mg total) into feeding tube 3 (three) times daily. 11/13/20   Steffanie Dunn, DO  labetalol (NORMODYNE) 5 MG/ML injection Inject 2 mLs (10 mg total) into the vein every hour as needed (for MAP > 95). 11/13/20   Karie Fetch P, DO  LORazepam (ATIVAN) 2 MG/ML injection Inject 0.5-1 mLs (1-2 mg total) into the vein every 2 (two) hours as needed for sedation. 11/13/20   Karie Fetch P, DO  melatonin 5 MG TABS Place 1 tablet (5 mg total) into feeding tube at bedtime as  needed. 11/13/20   Steffanie Dunn, DO  Mouthwashes (MOUTH RINSE) LIQD solution 15 mLs by Mouth Rinse route 2 (two) times daily. 11/13/20   Steffanie Dunn, DO  Multiple Vitamin (MULTIVITAMIN) LIQD Place 15 mLs into feeding tube daily. 11/14/20   Steffanie Dunn, DO  Nutritional Supplements (FEEDING SUPPLEMENT, OSMOLITE 1.5 CAL,) LIQD Place 1,000 mLs into feeding tube continuous. 11/13/20   Steffanie Dunn, DO  Nutritional Supplements (FEEDING SUPPLEMENT, PROSOURCE TF,) liquid Place 45 mLs into feeding tube 3 (three) times daily. 11/13/20   Steffanie Dunn, DO  polyethylene glycol (MIRALAX / GLYCOLAX) 17 g packet Place 17 g into feeding tube daily. 11/14/20   Steffanie Dunn, DO  predniSONE 5 MG/5ML solution Place 10 mLs (10 mg total) into feeding tube daily with breakfast. 11/14/20   Karie Fetch P, DO  QUEtiapine (SEROQUEL) 25 MG tablet Place 1 tablet (25 mg total) into feeding tube 2 (two) times daily. 11/13/20   Steffanie Dunn, DO  thiamine 100 MG tablet Place 1 tablet (100 mg total) into feeding tube daily. 11/14/20   Steffanie Dunn, DO  valproic acid (DEPAKENE) 250 MG/5ML solution Place 5 mLs (250 mg total) into feeding tube 3 (three) times daily. 11/13/20   Steffanie Dunn, DO  Water For Irrigation, Sterile (FREE WATER) SOLN Place 100 mLs into feeding tube every 8 (eight) hours. 11/13/20   Steffanie Dunn, DO    Critical care time:    Rolene Arbour, Medical Student 11/14/20 8:54 AM

## 2020-11-14 NOTE — Procedures (Addendum)
Patient Name: Jesse [name]  MRN: [id number]  Epilepsy Attending: Charlsie Quest  Referring Physician/Provider: Dr. Levon Hedger Date: 11/14/2020 Duration: 23.57 mins  Patient history: 45 year old male status post cardio-respiratory arrest.  EEG to evaluate for seizures.  Level of alertness: lethargic  AEDs during EEG study: None  Technical aspects: This EEG study was done with scalp electrodes positioned according to the 10-20 International system of electrode placement. Electrical activity was acquired at a sampling rate of 500Hz  and reviewed with a high frequency filter of 70Hz  and a low frequency filter of 1Hz . EEG data were recorded continuously and digitally stored.   Description: EEG showed continuous generalized polymorphic mixed frequencies with predominantly 6 to 9 Hz theta-alpha activity. Intermittent generalized 2 to 3 Hz delta slowing as well as frontocentral 15 to 18 Hz beta activity was also noted. Hyperventilation and photic stimulation were not performed.     ABNORMALITY -Continuous slow, generalized  IMPRESSION: This study is suggestive of severe diffuse encephalopathy, nonspecific etiology.  No seizures or epileptiform discharges were seen throughout the recording.  [name] 

## 2020-11-14 NOTE — Progress Notes (Addendum)
   11/14/20 0358  Assess: MEWS Score  Temp 98.2 F (36.8 C)  BP (!) 74/51  Pulse Rate 87  Resp 20  SpO2 (!) 61 %  O2 Device Room Air  Assess: MEWS Score  MEWS Temp 0  MEWS Systolic 2  MEWS Pulse 0  MEWS RR 0  MEWS LOC 0  MEWS Score 2  MEWS Score Color Yellow  Assess: if the MEWS score is Yellow or Red  Were vital signs taken at a resting state? Yes  Focused Assessment Change from prior assessment (see assessment flowsheet)  MEWS guidelines implemented *See Row Information* Yes  Treat  MEWS Interventions Escalated (See documentation below) (rapid response called)  Pain Scale 0-10  Pain Score 0  Take Vital Signs  Increase Vital Sign Frequency  Yellow: Q 2hr X 2 then Q 4hr X 2, if remains yellow, continue Q 4hrs  Escalate  MEWS: Escalate Yellow: discuss with charge nurse/RN and consider discussing with provider and RRT  Notify: Charge Nurse/RN  Name of Charge Nurse/RN Notified Lamar Benes, RN  Date Charge Nurse/RN Notified 11/14/20  Time Charge Nurse/RN Notified 0359  Notify: Provider  Provider Name/Title Code team  Date Provider Notified 11/14/20  Time Provider Notified 0410  Notification Type Face-to-face  Notification Reason Change in status  Provider response Other (Comment) (Code team present)  Date of Provider Response 11/14/20  Time of Provider Response 0410  Notify: Rapid Response  Name of Rapid Response RN Notified Onalee Hua, RN (code called)  Date Rapid Response Notified 11/14/20  Time Rapid Response Notified 0359  Document  Patient Outcome Transferred/level of care increased  Progress note created (see row info) Yes

## 2020-11-14 NOTE — Progress Notes (Addendum)
  Echocardiogram 2D Echocardiogram has been performed. Messaged cardiology with results.  Janalyn Harder 11/14/2020, 1:51 PM

## 2020-11-14 NOTE — Progress Notes (Signed)
Inpatient Rehabilitation  Patient information reviewed and entered into eRehab system by Markie Frith Sabre Romberger, OTR/L.   Information including medical coding, functional ability and quality indicators will be reviewed and updated through discharge.    

## 2020-11-15 LAB — URINE CULTURE: Culture: NO GROWTH

## 2020-11-15 LAB — MAGNESIUM: Magnesium: 2.6 mg/dL — ABNORMAL HIGH (ref 1.7–2.4)

## 2020-11-15 LAB — COMPREHENSIVE METABOLIC PANEL
ALT: 112 U/L — ABNORMAL HIGH (ref 0–44)
AST: 132 U/L — ABNORMAL HIGH (ref 15–41)
Albumin: 2.1 g/dL — ABNORMAL LOW (ref 3.5–5.0)
Alkaline Phosphatase: 78 U/L (ref 38–126)
Anion gap: 6 (ref 5–15)
BUN: 44 mg/dL — ABNORMAL HIGH (ref 6–20)
CO2: 24 mmol/L (ref 22–32)
Calcium: 7.4 mg/dL — ABNORMAL LOW (ref 8.9–10.3)
Chloride: 104 mmol/L (ref 98–111)
Creatinine, Ser: 1.56 mg/dL — ABNORMAL HIGH (ref 0.61–1.24)
GFR, Estimated: 55 mL/min — ABNORMAL LOW (ref >60)
Glucose, Bld: 182 mg/dL — ABNORMAL HIGH (ref 70–99)
Potassium: 4.4 mmol/L (ref 3.5–5.1)
Sodium: 134 mmol/L — ABNORMAL LOW (ref 135–145)
Total Bilirubin: 0.3 mg/dL (ref 0.3–1.2)
Total Protein: 4.8 g/dL — ABNORMAL LOW (ref 6.5–8.1)

## 2020-11-15 LAB — CBC
HCT: 25.1 % — ABNORMAL LOW (ref 39.0–52.0)
Hemoglobin: 8 g/dL — ABNORMAL LOW (ref 13.0–17.0)
MCH: 33.8 pg (ref 26.0–34.0)
MCHC: 31.9 g/dL (ref 30.0–36.0)
MCV: 105.9 fL — ABNORMAL HIGH (ref 80.0–100.0)
Platelets: 75 10*3/uL — ABNORMAL LOW (ref 150–400)
RBC: 2.37 MIL/uL — ABNORMAL LOW (ref 4.22–5.81)
RDW: 14 % (ref 11.5–15.5)
WBC: 9.8 10*3/uL (ref 4.0–10.5)
nRBC: 0.2 % (ref 0.0–0.2)

## 2020-11-15 LAB — GLUCOSE, CAPILLARY
Glucose-Capillary: 121 mg/dL — ABNORMAL HIGH (ref 70–99)
Glucose-Capillary: 122 mg/dL — ABNORMAL HIGH (ref 70–99)
Glucose-Capillary: 139 mg/dL — ABNORMAL HIGH (ref 70–99)
Glucose-Capillary: 146 mg/dL — ABNORMAL HIGH (ref 70–99)
Glucose-Capillary: 160 mg/dL — ABNORMAL HIGH (ref 70–99)
Glucose-Capillary: 186 mg/dL — ABNORMAL HIGH (ref 70–99)
Glucose-Capillary: 67 mg/dL — ABNORMAL LOW (ref 70–99)

## 2020-11-15 LAB — D-DIMER, QUANTITATIVE: D-Dimer, Quant: >20 ug/mL-FEU — ABNORMAL HIGH (ref 0.00–0.50)

## 2020-11-15 LAB — PROCALCITONIN: Procalcitonin: 18.97 ng/mL

## 2020-11-15 LAB — SARS CORONAVIRUS 2 (TAT 6-24 HRS): SARS Coronavirus 2: POSITIVE — AB

## 2020-11-15 LAB — POTASSIUM: Potassium: 5 mmol/L (ref 3.5–5.1)

## 2020-11-15 LAB — TRIGLYCERIDES: Triglycerides: 114 mg/dL (ref <150)

## 2020-11-15 MED ORDER — INSULIN ASPART 100 UNIT/ML IJ SOLN
0.0000 [IU] | INTRAMUSCULAR | Status: DC
  Administered 2020-11-15 – 2020-11-16 (×9): 2 [IU] via SUBCUTANEOUS
  Administered 2020-11-17: 3 [IU] via SUBCUTANEOUS
  Administered 2020-11-17 – 2020-11-18 (×7): 2 [IU] via SUBCUTANEOUS
  Administered 2020-11-18 – 2020-11-19 (×2): 3 [IU] via SUBCUTANEOUS
  Administered 2020-11-19 – 2020-11-20 (×4): 2 [IU] via SUBCUTANEOUS
  Administered 2020-11-20: 3 [IU] via SUBCUTANEOUS
  Administered 2020-11-20: 2 [IU] via SUBCUTANEOUS
  Administered 2020-11-21: 3 [IU] via SUBCUTANEOUS
  Administered 2020-11-21: 2 [IU] via SUBCUTANEOUS
  Administered 2020-11-22: 3 [IU] via SUBCUTANEOUS
  Administered 2020-11-24 – 2020-11-25 (×4): 2 [IU] via SUBCUTANEOUS
  Administered 2020-11-25: 3 [IU] via SUBCUTANEOUS
  Administered 2020-11-25 – 2020-11-27 (×4): 2 [IU] via SUBCUTANEOUS

## 2020-11-15 MED ORDER — ORAL CARE MOUTH RINSE
15.0000 mL | Freq: Two times a day (BID) | OROMUCOSAL | Status: DC
  Administered 2020-11-15 – 2020-11-27 (×20): 15 mL via OROMUCOSAL

## 2020-11-15 MED ORDER — SODIUM CHLORIDE 0.9 % IV SOLN
200.0000 mg | Freq: Once | INTRAVENOUS | Status: AC
  Administered 2020-11-15: 200 mg via INTRAVENOUS
  Filled 2020-11-15: qty 40

## 2020-11-15 MED ORDER — INSULIN ASPART 100 UNIT/ML IJ SOLN: 0.0000 [IU] | INTRAMUSCULAR | Status: DC

## 2020-11-15 MED ORDER — ASCORBIC ACID 500 MG PO TABS
500.0000 mg | ORAL_TABLET | Freq: Every day | ORAL | Status: DC
  Administered 2020-11-15: 500 mg via ORAL
  Filled 2020-11-15: qty 1

## 2020-11-15 MED ORDER — BARICITINIB 2 MG PO TABS
2.0000 mg | ORAL_TABLET | Freq: Every day | ORAL | Status: DC
  Administered 2020-11-15: 2 mg via ORAL
  Filled 2020-11-15 (×2): qty 1

## 2020-11-15 MED ORDER — SODIUM CHLORIDE 0.9 % IV SOLN
100.0000 mg | Freq: Every day | INTRAVENOUS | Status: AC
  Administered 2020-11-16 – 2020-11-19 (×4): 100 mg via INTRAVENOUS
  Filled 2020-11-15 (×4): qty 20

## 2020-11-15 MED ORDER — ZINC SULFATE 220 (50 ZN) MG PO CAPS
220.0000 mg | ORAL_CAPSULE | Freq: Every day | ORAL | Status: DC
  Administered 2020-11-15: 220 mg via ORAL
  Filled 2020-11-15: qty 1

## 2020-11-15 MED ORDER — OXYCODONE HCL 5 MG/5ML PO SOLN
5.0000 mg | ORAL | Status: DC | PRN
  Administered 2020-11-15: 5 mg
  Administered 2020-11-18 – 2020-11-20 (×2): 10 mg
  Filled 2020-11-15: qty 5
  Filled 2020-11-15 (×2): qty 10

## 2020-11-15 MED ORDER — DEXAMETHASONE SODIUM PHOSPHATE 10 MG/ML IJ SOLN
6.0000 mg | INTRAMUSCULAR | Status: AC
  Administered 2020-11-15 – 2020-11-24 (×10): 6 mg via INTRAVENOUS
  Filled 2020-11-15 (×10): qty 0.6

## 2020-11-15 MED ORDER — HEPARIN SODIUM (PORCINE) 5000 UNIT/ML IJ SOLN
5000.0000 [IU] | Freq: Three times a day (TID) | INTRAMUSCULAR | Status: DC
  Administered 2020-11-15 – 2020-11-16 (×4): 5000 [IU] via SUBCUTANEOUS
  Filled 2020-11-15 (×4): qty 1

## 2020-11-15 MED ORDER — DEXTROSE 50 % IV SOLN
12.5000 g | Freq: Once | INTRAVENOUS | Status: AC
  Administered 2020-11-15: 12.5 g via INTRAVENOUS

## 2020-11-15 MED ORDER — SODIUM CHLORIDE 0.9 % IV SOLN
2.0000 g | INTRAVENOUS | Status: AC
Start: 2020-11-15 — End: 2020-11-19
  Administered 2020-11-15 – 2020-11-19 (×5): 2 g via INTRAVENOUS
  Filled 2020-11-15 (×2): qty 20
  Filled 2020-11-15: qty 2
  Filled 2020-11-15 (×2): qty 20

## 2020-11-15 MED ORDER — HYDRALAZINE HCL 20 MG/ML IJ SOLN
20.0000 mg | INTRAMUSCULAR | Status: DC | PRN
  Administered 2020-11-16 – 2020-11-23 (×14): 20 mg via INTRAVENOUS
  Filled 2020-11-15 (×14): qty 1

## 2020-11-15 MED ORDER — HYDRALAZINE HCL 20 MG/ML IJ SOLN
10.0000 mg | INTRAMUSCULAR | Status: DC | PRN
  Administered 2020-11-15: 10 mg via INTRAVENOUS
  Filled 2020-11-15: qty 1

## 2020-11-15 MED ORDER — LABETALOL HCL 5 MG/ML IV SOLN
10.0000 mg | INTRAVENOUS | Status: AC | PRN
Start: 2020-11-15 — End: 2020-11-17
  Administered 2020-11-15: 10 mg via INTRAVENOUS
  Administered 2020-11-16: 20 mg via INTRAVENOUS
  Administered 2020-11-16 (×2): 10 mg via INTRAVENOUS
  Administered 2020-11-17 (×6): 20 mg via INTRAVENOUS
  Filled 2020-11-15 (×9): qty 4

## 2020-11-15 NOTE — Progress Notes (Signed)
Notified Elink nurse of Covid positive test this am

## 2020-11-15 NOTE — H&P (Signed)
NAME:  Jesse [name], MRN:  [id number], DOB:  Apr 06, 1976, LOS: 1 ADMISSION DATE:  11/14/2020 CONSULTATION DATE:  11/14/2020 REFERRING MD:  Hermelinda Medicus - Rehab CHIEF COMPLAINT:  Post-Code, respiratory arrest   History of Present Illness:  45 year old man with PMHx significant for anxiety, depression, tobacco and EtOH use initially admitted to [facility] 7/12 for chest pain radiating to his neck. Symptoms worsened to include acute receptive/expressive aphasia, unilateral blurry vision in R eye and AMS. Code Stroke called, initial CT Head negative; however, CTA revealed acute dissection involving the aortic arch, through the innominate artery and R common carotid artery.   He was taken emergently to the OR 7/12 for repair. Intubated for procedure, extubated same day. Given aphasia at the time of presentation, Neuro was consulted postoperatively for possible CVA post-dissection. 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 The following day (7/15), patient had increasing O2 requirements and was reintubated (extubated to HFNC 7/16). Postoperative course was additionally complicated by coagulopathy, L antecubital superficial venous thrombus and persistent hyperactive delirium (requiring Precedex). He was transferred to [facility] 7/21.  On 7/22 on 0400 vitals check, patient was noted to be hypotensive to MAPs 50s and tachypneic to 40s. Per patient's wife, she was present at bedside until around 0230, but notes that patient appeared "different" after last PM medications were given (~2100 7/21). He began to breathe agonally and a Code Blue was called. Per RRT/Code Team, patient did not lose a pulse or require compressions. Patient was reintubated and low dose Levo was initiated.  PCCM consulted for ICU admission post-code.  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 Intubated, IO placed. Low dose Levo initiated. Transferred to 2H. 7/23 COVID returned positive  Interim History / Subjective:  Remains on vent but more awake, following commands.  Objective:  Blood pressure 114/76, pulse 68, temperature 99.5 F (37.5 C), resp. rate (!) 22, weight 68.8 kg, SpO2 100 %. CVP:  [13 mmHg-19 mmHg] 16 mmHg  Vent Mode: PRVC FiO2 (%):  [40 %] 40 % Set Rate:  [20 bmp] 20 bmp Vt Set:  [500 mL] 500 mL PEEP:  [5 cmH20] 5 cmH20 Plateau Pressure:  [12 cmH20-13 cmH20] 12 cmH20   Intake/Output Summary (Last 24 hours) at 11/15/2020 0846 Last data filed at 11/15/2020 0645 Gross per 24 hour  Intake 3922.06 ml  Output 2610 ml  Net 1312.06 ml   Filed Weights   11/15/20 0500  Weight: 68.8 kg    Physical Examination: Constitutional: no distress on vent  Eyes: EOMI, pupils equal Ears, nose, mouth, and throat: ETT in place, minimal secretions Cardiovascular: RRR, ext warm Respiratory: diminished bases Gastrointestinal: soft, +BS Skin: No rashes, normal turgor Neurologic: moving all ext for me today, moves R>L Psychiatric: RASS -1  K better CBC pending  Resolved Hospital Problem List:    Assessment & Plan:  Presumed COVID-associated hypoxemic respiratory arrest Elevated Pct Aortic dissection post repair  Aortic dissection associated R MCA/PCA infarct Thrombocytopenia Left basilic and cephalic thrombus Recurrent delirium during stay  - Remdesivir, steroids, baricitinib - Trend D dimer, watch Plts, may  increase AC depending on trends, heparin 5000 units TID for now - Wean vent today and hopeful extubation - Cefepime x 5-7 days reasonable given elevated Pct question concomittant aspiration  Best Practice: (right click and "Reselect all SmartList Selections" daily)   Diet/type: TF DVT prophylaxis: SCD GI prophylaxis: PPI Lines: yes, needed Foley:  yes, needed Code Status:  full code Last date of multidisciplinary goals of care discussion [daily]  Patient critically ill due to respiratory failure, delirium Interventions to address this today precedex and vent titration Risk of deterioration without these interventions is high  I personally spent 41 minutes providing critical care not including any separately billable procedures  Myrla Halsted MD [facility]  Prefer epic messenger for cross cover needs If after hours, please call E-link

## 2020-11-15 NOTE — Evaluation (Signed)
Physical Therapy Evaluation Patient Details Name: Jesse [name] MRN: [id number] DOB: 18-Jan-1976 Today's Date: 11/15/2020   History of Present Illness  Pt is a 45 y.o. male admitted 11/04/20 with acute onset aphasia, blurry vision. CTA revealed acute aortic dissection. Pt with worsening o2 requirements requiring intubation 7/15, self extubated 7/16. 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 Transferred to CIR 7/21-7/22 and returned to acute hospital after respiratory failure with intubation x 24 hours.   PMH: Anxiety, Depression, Essential hypertension.  Clinical Impression   Pt admitted secondary to problem above with deficits below. PTA patient was transferred to [facility] for therapies and at that time was requiring +2 mod assist for transfers. Pt currently requires total assist as he is not following commands. Will need +2 to further assess.  Anticipate patient will benefit from PT to address problems listed below.Will continue to follow acutely to maximize functional mobility independence and safety.       Follow Up Recommendations CIR    Equipment Recommendations  Other (comment);Wheelchair (measurements PT);Wheelchair cushion (measurements PT);Hospital bed    Recommendations for Other Services Speech consult     Precautions / Restrictions Precautions Precautions: Fall;Sternal Precaution Comments: L inattention, cortrak      Mobility  Bed Mobility Overal bed mobility: Needs Assistance Bed Mobility: Rolling Rolling: Total assist         General bed mobility comments: pt not following commands or hand-over-hand instruction    Transfers                    Ambulation/Gait                Stairs            Wheelchair Mobility    Modified Rankin (Stroke Patients Only) Modified Rankin (Stroke Patients Only) Pre-Morbid Rankin Score: No symptoms Modified  Rankin: Severe disability     Balance                                             Pertinent Vitals/Pain Pain Assessment: Faces Faces Pain Scale: No hurt    Home Living Family/patient expects to be discharged to:: Private residence Living Arrangements: Spouse/significant other;Children Available Help at Discharge: Family;Available 24 hours/day Type of Home: House Home Access: Stairs to enter Entrance Stairs-Rails: None Entrance Stairs-Number of Steps: 2 Home Layout: Able to live on main level with bedroom/bathroom   Additional Comments: pt non-verbal; information from CIR charting    Prior Function Level of Independence: Independent         Comments: prior to initial admission 7/12     Hand Dominance        Extremity/Trunk Assessment   Upper Extremity Assessment Upper Extremity Assessment: Defer to OT evaluation    Lower Extremity Assessment Lower Extremity Assessment: Difficult to assess due to impaired cognition RLE Deficits / Details: pt raises both LEs against gravity at bed level LLE Deficits / Details: pt raises both LEs against gravity at bed level       Communication   Communication: Receptive difficulties;Expressive difficulties  Cognition Arousal/Alertness: Awake/alert Behavior During Therapy: Flat affect Overall Cognitive Status: Impaired/Different from baseline Area of Impairment: Following commands                       Following Commands:  (  not following 1 step commands even with multi-modal cues)              General Comments      Exercises Other Exercises Other Exercises: PROM at all joints of BUE and bil LEs   Assessment/Plan    PT Assessment Patient needs continued PT services  PT Problem List Decreased strength;Decreased activity tolerance;Decreased balance;Decreased mobility;Decreased cognition;Decreased knowledge of use of DME;Decreased safety awareness;Decreased knowledge of  precautions;Cardiopulmonary status limiting activity       PT Treatment Interventions DME instruction;Gait training;Stair training;Functional mobility training;Therapeutic activities;Therapeutic exercise;Balance training;Neuromuscular re-education;Cognitive remediation;Patient/family education    PT Goals (Current goals can be found in the Care Plan section)  Acute Rehab PT Goals Patient Stated Goal: unable to state PT Goal Formulation: Patient unable to participate in goal setting Time For Goal Achievement: 11/29/20 Potential to Achieve Goals: Fair    Frequency Min 4X/week   Barriers to discharge        Co-evaluation               AM-PAC PT "6 Clicks" Mobility  Outcome Measure Help needed turning from your back to your side while in a flat bed without using bedrails?: Total Help needed moving from lying on your back to sitting on the side of a flat bed without using bedrails?: Total Help needed moving to and from a bed to a chair (including a wheelchair)?: Total Help needed standing up from a chair using your arms (e.g., wheelchair or bedside chair)?: Total Help needed to walk in hospital room?: Total Help needed climbing 3-5 steps with a railing? : Total 6 Click Score: 6    End of Session   Activity Tolerance: Patient tolerated treatment well Patient left: in bed;with call bell/phone within reach;with bed alarm set Nurse Communication: Other (comment) (not following any commands; will need +2 for mobility) PT Visit Diagnosis: Other symptoms and signs involving the nervous system (R29.898) Hemiplegia - Right/Left: Left Hemiplegia - dominant/non-dominant: Non-dominant Hemiplegia - caused by: Cerebral infarction    Time: 2426-8341 PT Time Calculation (min) (ACUTE ONLY): 15 min   Charges:   PT Evaluation $PT Eval Low Complexity: 1 Low           Jerolyn Center, PT Pager [phone]   Zena Amos 11/15/2020, 2:34 PM

## 2020-11-15 NOTE — Progress Notes (Addendum)
eLink Physician-Brief Progress Note Patient Name: Jesse [name] DOB: [birth year] MRN: [id number]   Date of Service  11/15/2020  HPI/Events of Note  HTN to 176/99. Patient is calm and not endorsing pain.    eICU Interventions  Ordered hydralazine 10mg  IV Q4H PRN SBP >\= 150 mmHg. RN to notify MD if dose needs to be increased to achieve goal.   ADDENDUM 11/15/20 11:20 PM - BP remains elevated above goal. - Hydralazine dose increased to 20mg  IV Q4H PRN SBP > 150 mmHg. - Also added labetalol 20mg  IV Q2H PRN SBP > 150 mmHg (hold for HR <\= 60 bpm).  Intervention Category Intermediate Interventions: Hypertension - evaluation and management  11/17/20 11/15/2020, 9:33 PM

## 2020-11-15 NOTE — Procedures (Signed)
Extubation Procedure Note  Patient Details:   Name: Kian Ottaviano DOB: [birth year] MRN: [id number]   Airway Documentation:    Vent end date: 11/15/20 Vent end time: 0912   Evaluation  O2 sats: stable throughout Complications: No apparent complications Patient did tolerate procedure well. Bilateral Breath Sounds: Diminished, Clear   No, pt could not speak post extubation.  Pt extubated to 4 l/m Hoffman per physician's order without difficulty.  Audrie Lia 11/15/2020, 9:13 AM

## 2020-11-16 LAB — CULTURE, BLOOD (ROUTINE X 2)
Culture: NO GROWTH
Culture: NO GROWTH

## 2020-11-16 LAB — MAGNESIUM: Magnesium: 2.1 mg/dL (ref 1.7–2.4)

## 2020-11-16 LAB — CBC
HCT: 25.8 % — ABNORMAL LOW (ref 39.0–52.0)
Hemoglobin: 8.2 g/dL — ABNORMAL LOW (ref 13.0–17.0)
MCH: 32.9 pg (ref 26.0–34.0)
MCHC: 31.8 g/dL (ref 30.0–36.0)
MCV: 103.6 fL — ABNORMAL HIGH (ref 80.0–100.0)
Platelets: 91 10*3/uL — ABNORMAL LOW (ref 150–400)
RBC: 2.49 MIL/uL — ABNORMAL LOW (ref 4.22–5.81)
RDW: 13.5 % (ref 11.5–15.5)
WBC: 10 10*3/uL (ref 4.0–10.5)
nRBC: 0 % (ref 0.0–0.2)

## 2020-11-16 LAB — BASIC METABOLIC PANEL
Anion gap: 7 (ref 5–15)
BUN: 36 mg/dL — ABNORMAL HIGH (ref 6–20)
CO2: 28 mmol/L (ref 22–32)
Calcium: 8.2 mg/dL — ABNORMAL LOW (ref 8.9–10.3)
Chloride: 103 mmol/L (ref 98–111)
Creatinine, Ser: 1.07 mg/dL (ref 0.61–1.24)
GFR, Estimated: >60 mL/min (ref >60)
Glucose, Bld: 102 mg/dL — ABNORMAL HIGH (ref 70–99)
Potassium: 4 mmol/L (ref 3.5–5.1)
Sodium: 138 mmol/L (ref 135–145)

## 2020-11-16 LAB — PHOSPHORUS: Phosphorus: 2.5 mg/dL (ref 2.5–4.6)

## 2020-11-16 LAB — GLUCOSE, CAPILLARY
Glucose-Capillary: 101 mg/dL — ABNORMAL HIGH (ref 70–99)
Glucose-Capillary: 122 mg/dL — ABNORMAL HIGH (ref 70–99)
Glucose-Capillary: 129 mg/dL — ABNORMAL HIGH (ref 70–99)
Glucose-Capillary: 131 mg/dL — ABNORMAL HIGH (ref 70–99)
Glucose-Capillary: 133 mg/dL — ABNORMAL HIGH (ref 70–99)
Glucose-Capillary: 147 mg/dL — ABNORMAL HIGH (ref 70–99)

## 2020-11-16 LAB — PROCALCITONIN: Procalcitonin: 8.77 ng/mL

## 2020-11-16 MED ORDER — ZINC SULFATE 220 (50 ZN) MG PO CAPS
220.0000 mg | ORAL_CAPSULE | Freq: Every day | ORAL | Status: DC
  Administered 2020-11-16 – 2020-11-21 (×6): 220 mg
  Filled 2020-11-16 (×6): qty 1

## 2020-11-16 MED ORDER — ASPIRIN 81 MG PO CHEW
81.0000 mg | CHEWABLE_TABLET | Freq: Every day | ORAL | Status: DC
  Administered 2020-11-17 – 2020-11-21 (×5): 81 mg
  Filled 2020-11-16 (×5): qty 1

## 2020-11-16 MED ORDER — QUETIAPINE FUMARATE 50 MG PO TABS: 50.0000 mg | ORAL_TABLET | Freq: Every day | ORAL | Status: DC

## 2020-11-16 MED ORDER — CHLORHEXIDINE GLUCONATE CLOTH 2 % EX PADS
6.0000 | MEDICATED_PAD | Freq: Every day | CUTANEOUS | Status: DC
  Administered 2020-11-17 – 2020-11-27 (×11): 6 via TOPICAL

## 2020-11-16 MED ORDER — ASCORBIC ACID 500 MG PO TABS
500.0000 mg | ORAL_TABLET | Freq: Every day | ORAL | Status: DC
  Administered 2020-11-16 – 2020-11-21 (×6): 500 mg
  Filled 2020-11-16 (×6): qty 1

## 2020-11-16 MED ORDER — POLYETHYLENE GLYCOL 3350 17 G PO PACK
17.0000 g | PACK | Freq: Every day | ORAL | Status: DC | PRN
  Administered 2020-11-19: 17 g
  Filled 2020-11-16: qty 1

## 2020-11-16 MED ORDER — ENOXAPARIN SODIUM 40 MG/0.4ML IJ SOSY
40.0000 mg | PREFILLED_SYRINGE | Freq: Two times a day (BID) | INTRAMUSCULAR | Status: DC
  Administered 2020-11-17 – 2020-11-18 (×3): 40 mg via SUBCUTANEOUS
  Filled 2020-11-16 (×4): qty 0.4

## 2020-11-16 MED ORDER — QUETIAPINE FUMARATE 25 MG PO TABS
50.0000 mg | ORAL_TABLET | Freq: Every day | ORAL | Status: DC
  Administered 2020-11-16 – 2020-11-20 (×5): 50 mg
  Filled 2020-11-16 (×4): qty 1
  Filled 2020-11-16: qty 2

## 2020-11-16 MED ORDER — ENOXAPARIN SODIUM 300 MG/3ML IJ SOLN
0.5000 mg/kg | Freq: Two times a day (BID) | INTRAMUSCULAR | Status: DC
  Filled 2020-11-16 (×3): qty 0.35

## 2020-11-16 MED ORDER — AMLODIPINE BESYLATE 5 MG PO TABS: 5.0000 mg | ORAL_TABLET | Freq: Every day | ORAL | Status: DC

## 2020-11-16 MED ORDER — BARICITINIB 2 MG PO TABS
2.0000 mg | ORAL_TABLET | Freq: Every day | ORAL | Status: DC
Start: 2020-11-16 — End: 2020-11-21
  Administered 2020-11-16 – 2020-11-21 (×6): 2 mg
  Filled 2020-11-16 (×6): qty 1

## 2020-11-16 MED ORDER — AMLODIPINE BESYLATE 5 MG PO TABS
5.0000 mg | ORAL_TABLET | Freq: Every day | ORAL | Status: DC
  Administered 2020-11-16 – 2020-11-18 (×3): 5 mg
  Filled 2020-11-16 (×3): qty 1

## 2020-11-16 MED ORDER — ENOXAPARIN SODIUM 300 MG/3ML IJ SOLN
0.5000 mg/kg | Freq: Once | INTRAMUSCULAR | Status: AC
  Administered 2020-11-16: 35 mg via SUBCUTANEOUS

## 2020-11-16 NOTE — Progress Notes (Signed)
NAME:  Jesse [name], MRN:  [id number], DOB:  [birth year], LOS: 2 ADMISSION DATE:  11/14/2020 CONSULTATION DATE:  11/14/2020 REFERRING MD:  Hermelinda Medicus - Rehab CHIEF COMPLAINT:  Post-Code, respiratory arrest   History of Present Illness:  45 year old man with PMHx significant for anxiety, depression, tobacco and EtOH use initially admitted to [facility] 7/12 for chest pain radiating to his neck. Symptoms worsened to include acute receptive/expressive aphasia, unilateral blurry vision in R eye and AMS. Code Stroke called, initial CT Head negative; however, CTA revealed acute dissection involving the aortic arch, through the innominate artery and R common carotid artery.   He was taken emergently to the OR 7/12 for repair. Intubated for procedure, extubated same day. Given aphasia at the time of presentation, Neuro was consulted postoperatively for possible CVA post-dissection. 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 The following day (7/15), patient had increasing O2 requirements and was reintubated (extubated to HFNC 7/16). Postoperative course was additionally complicated by coagulopathy, L antecubital superficial venous thrombus and persistent hyperactive delirium (requiring Precedex). He was transferred to [facility] 7/21.  On 7/22 on 0400 vitals check, patient was noted to be hypotensive to MAPs 50s and tachypneic to 40s. Per patient's wife, she was present at bedside until around 0230, but notes that patient appeared "different" after last PM medications were given (~2100 7/21). He began to breathe agonally and a Code Blue was called. Per RRT/Code Team, patient did not lose a pulse or require compressions. Patient was reintubated and low dose Levo was initiated.  PCCM consulted for ICU admission post-code.  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 Intubated, IO placed. Low dose Levo initiated. Transferred to 2H. 7/23 COVID returned positive  Interim History / Subjective:  Now on RA More cooperative per nursing Diuresing well  Objective:  Blood pressure 138/78, pulse 73, temperature 100.22 F (37.9 C), resp. rate 16, weight 67.5 kg, SpO2 98 %. CVP:  [8 mmHg-15 mmHg] 12 mmHg  FiO2 (%):  [36 %] 36 %   Intake/Output Summary (Last 24 hours) at 11/16/2020 4098 Last data filed at 11/16/2020 0700 Gross per 24 hour  Intake 1102.78 ml  Output 3715 ml  Net -2612.22 ml    Filed Weights   11/15/20 0500 11/16/20 0500  Weight: 68.8 kg 67.5 kg    Physical Examination: No distress Lungs clear Sternal incision CDI Ext without edema Improved LUE swelling  Cr improved Improving plts  Resolved Hospital Problem List:    Assessment & Plan:  Presumed COVID-associated hypoxemic respiratory arrest w/ elevated pct tx as aspiration + COVID AKI resolved Hyperkalemia resolved Aortic dissection post repair Aortic dissection associated R MCA/PCA infarct stable on imaging Thrombocytopenia improved Left basilic and cephalic thrombus, high D dimer Recurrent delirium during stay, did not sleep last night Persistent dysphagia  - Remdesivir, steroids, baricitinib for usual courses - Ceftriaxone x 7 days - Increase AC to lovenox 0.5mg /kg BID  given known superficial thrombus, COVID infection, and high D dimer raising risk of VTE - Add back seroquel qHS - Continue TF - Stable for transfer to  floor, may benefit from CIR if still eligible, appreciate [facility] taking over care starting 7/25  Myrla Halsted MD PCCM

## 2020-11-16 NOTE — Progress Notes (Signed)
Inpatient Rehab Admissions Coordinator Note:   Per PT recommendation, pt was screened for CIR candidacy by Wolfgang Phoenix, MS, CCC-SLP.  At this time we are not recommending an inpatient rehab consult.  Noted COVID + 11/15/20. Patients are eligible to be considered for admission to CIR when cleared from airborne precautions by acute MD or infectious disease. Otherwise, they will need to be >20 days from their positive test with recovery/improvement in symptoms or 2 negative tests.  Please contact me with questions.    Wolfgang Phoenix, MS, CCC-SLP Admissions Coordinator [phone] 11/16/20 10:55 AM

## 2020-11-17 DIAGNOSIS — L8990 Pressure ulcer of unspecified site, unspecified stage: Secondary | ICD-10-CM | POA: Insufficient documentation

## 2020-11-17 LAB — CBC
HCT: 29.4 % — ABNORMAL LOW (ref 39.0–52.0)
Hemoglobin: 9.6 g/dL — ABNORMAL LOW (ref 13.0–17.0)
MCH: 32.8 pg (ref 26.0–34.0)
MCHC: 32.7 g/dL (ref 30.0–36.0)
MCV: 100.3 fL — ABNORMAL HIGH (ref 80.0–100.0)
Platelets: 116 10*3/uL — ABNORMAL LOW (ref 150–400)
RBC: 2.93 MIL/uL — ABNORMAL LOW (ref 4.22–5.81)
RDW: 13.5 % (ref 11.5–15.5)
WBC: 8.4 10*3/uL (ref 4.0–10.5)
nRBC: 0.2 % (ref 0.0–0.2)

## 2020-11-17 LAB — MAGNESIUM: Magnesium: 2.3 mg/dL (ref 1.7–2.4)

## 2020-11-17 LAB — BASIC METABOLIC PANEL
Anion gap: 11 (ref 5–15)
BUN: 41 mg/dL — ABNORMAL HIGH (ref 6–20)
CO2: 22 mmol/L (ref 22–32)
Calcium: 8.6 mg/dL — ABNORMAL LOW (ref 8.9–10.3)
Chloride: 105 mmol/L (ref 98–111)
Creatinine, Ser: 1.05 mg/dL (ref 0.61–1.24)
GFR, Estimated: >60 mL/min (ref >60)
Glucose, Bld: 91 mg/dL (ref 70–99)
Potassium: 4.3 mmol/L (ref 3.5–5.1)
Sodium: 138 mmol/L (ref 135–145)

## 2020-11-17 LAB — GLUCOSE, CAPILLARY
Glucose-Capillary: 149 mg/dL — ABNORMAL HIGH (ref 70–99)
Glucose-Capillary: 149 mg/dL — ABNORMAL HIGH (ref 70–99)
Glucose-Capillary: 145 mg/dL — ABNORMAL HIGH (ref 70–99)
Glucose-Capillary: 158 mg/dL — ABNORMAL HIGH (ref 70–99)
Glucose-Capillary: 91 mg/dL (ref 70–99)

## 2020-11-17 LAB — PHOSPHORUS: Phosphorus: 3.3 mg/dL (ref 2.5–4.6)

## 2020-11-17 MED ORDER — FREE WATER
200.0000 mL | Freq: Three times a day (TID) | Status: DC
  Administered 2020-11-17 – 2020-11-19 (×7): 200 mL

## 2020-11-17 NOTE — Progress Notes (Signed)
Nutrition Follow-up  DOCUMENTATION CODES:   Severe malnutrition in context of chronic illness, Underweight  INTERVENTION:   Continue Tube Feeding via Cortrak: Osmolite 1.5 at 60 ml/h (1440 ml per day) Prosource TF 45 ml TID  Provides 2280 kcal, 123 gm protein, 1097 ml free water daily    NUTRITION DIAGNOSIS:   Severe Malnutrition related to chronic illness (anxiety, HTN, ETOH use) as evidenced by severe muscle depletion, severe fat depletion.  Being addressed via TF   GOAL:   Patient will meet greater than or equal to 90% of their needs  Met via TF  MONITOR:   TF tolerance, Labs, Skin, Vent status  REASON FOR ASSESSMENT:   Ventilator    ASSESSMENT:   45 yo male with recent hospitalization 7/12-7/21 for R MCA stroke. Admitted to CIR on 7/21. Transferred from CIR to ICU on 7/22 S/P respiratory arrest requiring intubation. PMH includes anxiety, depression, HTN, ETOH use, tobacco use.  7/12 Admitted for R MCA Stroke 7/16 Cortrak placed-gastric 7/21 Admitted to [place] 7/22 Intubated 7/23 Extubated, COVID+  Remains NPO, currently on room air Tolerating Osmolite 1.5 at 60 ml/hr, Pro-Source TF 45 mL TID  Noted pt confused at times  Current wt 67.5 kg  Labs: reviewed Meds:ss novolog, zinc sulfate, Vit C, decadron   Diet Order:   Diet Order             Diet NPO time specified  Diet effective now                   EDUCATION NEEDS:   Not appropriate for education at this time  Skin:  Skin Assessment: Skin Integrity Issues: Skin Integrity Issues:: Stage I Stage I: vertebral column Incisions: chest, groin  Last BM:  7/25  Height:   Ht Readings from Last 1 Encounters:  11/13/20 _0  (1.88 m)    Weight:   Wt Readings from Last 1 Encounters:  11/17/20 67.5 kg     BMI:  Body mass index is 19.11 kg/m.  Estimated Nutritional Needs:   Kcal:  2100-2300  Protein:  115-130 gm  Fluid:  >/= 2 L   Kerman Passey MS, RDN, LDN, CNSC Registered  Dietitian III Clinical Nutrition RD Pager and On-Call Pager Number Located in [place]

## 2020-11-17 NOTE — Evaluation (Addendum)
Occupational Therapy Evaluation Patient Details Name: Jesse [name] MRN: [id number] DOB: August 17, 1975 Today's Date: 11/17/2020    History of Present Illness Pt is a 45 y.o. male admitted 11/04/20 with acute onset aphasia, blurry vision. CTA revealed acute aortic dissection. Pt with worsening o2 requirements requiring intubation 7/15, self extubated 7/16. 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 Transferred to CIR 7/21-7/22 and returned to acute hospital after respiratory failure with intubation x 24 hours.   PMH: Anxiety, Depression, Essential hypertension.   Clinical Impression   Pt was independent prior to admission on 11/04/20. He presents with impaired communication, decreased cognition, generalized weakness and impaired balance. He requires +2 assist for all mobility and total assist for ADL. Pt becomes alert when assisted to sit at EOB. VSS on RA throughout session.     Follow Up Recommendations  SNF;Supervision/Assistance - 24 hour    Equipment Recommendations  Other (comment) (defer to next venue)    Recommendations for Other Services       Precautions / Restrictions Precautions Precautions: Fall;Sternal Precaution Comments: cortrak, rectal pouch Restrictions Weight Bearing Restrictions: No      Mobility Bed Mobility Overal bed mobility: Needs Assistance Bed Mobility: Supine to Sit;Sit to Supine     Supine to sit: +2 for physical assistance;Max assist Sit to supine: +2 for physical assistance;Max assist   General bed mobility comments: assist to raise trunk into sitting (LEs already over EOB upon release of bed rail), to guide trunk and return LEs back into bed    Transfers Overall transfer level: Needs assistance Equipment used: 2 person hand held assist Transfers: Sit to/from Stand Sit to Stand: Mod assist;+2 physical assistance         General transfer  comment: +2 mod assist to stand, maintains with +2 min assist, took several steps to [facility] with +2 min assist prior to return to bed    Balance Overall balance assessment: Needs assistance Sitting-balance support: No upper extremity supported;Feet supported Sitting balance-Leahy Scale: Poor Sitting balance - Comments: props on L UE   Standing balance support: Bilateral upper extremity supported;During functional activity Standing balance-Leahy Scale: Poor                             ADL either performed or assessed with clinical judgement   ADL                                         General ADL Comments: total assist for all ADL     Vision   Vision Assessment?: Vision impaired- to be further tested in functional context     Perception  Deficits: Initiation   Praxis      Pertinent Vitals/Pain Pain Assessment: Faces Faces Pain Scale: No hurt     Hand Dominance     Extremity/Trunk Assessment Upper Extremity Assessment Upper Extremity Assessment: Generalized weakness;Difficult to assess due to impaired cognition       Cervical / Trunk Assessment Cervical / Trunk Assessment: Other exceptions Cervical / Trunk Exceptions: weakness   Communication Communication Communication: Receptive difficulties;Expressive difficulties   Cognition Arousal/Alertness: Awake/alert Behavior During Therapy: Flat affect Overall Cognitive Status: Impaired/Different from baseline Area of Impairment: Following commands;Attention;Safety/judgement  Current Attention Level: Focused   Following Commands:  (does not follow commands) Safety/Judgement: Decreased awareness of safety     General Comments: pt much more alert once assisted to EOB, requires mulitmodal cues to follow through with motor output   General Comments       Exercises     Shoulder Instructions      Home Living Family/patient expects to be discharged to:: Private  residence Living Arrangements: Spouse/significant other;Children Available Help at Discharge: Family;Available 24 hours/day Type of Home: House Home Access: Stairs to enter [employer] of Steps: 2 Entrance Stairs-Rails: None Home Layout: Able to live on main level with bedroom/bathroom     Bathroom Shower/Tub: Chief Strategy Officer: Standard         Additional Comments: pt non-verbal; information from CIR charting      Prior Functioning/Environment Level of Independence: Independent        Comments: prior to initial admission 7/12        OT Problem List: Decreased strength;Decreased activity tolerance;Impaired balance (sitting and/or standing);Impaired vision/perception;Decreased coordination;Decreased cognition;Decreased safety awareness;Decreased knowledge of use of DME or AE;Decreased knowledge of precautions;Cardiopulmonary status limiting activity;Impaired sensation;Impaired UE functional use      OT Treatment/Interventions: Self-care/ADL training;DME and/or AE instruction;Therapeutic activities;Cognitive remediation/compensation;Patient/family education;Balance training;Visual/perceptual remediation/compensation;Therapeutic exercise;Neuromuscular education    OT Goals(Current goals can be found in the care plan section) Acute Rehab OT Goals Patient Stated Goal: unable to state OT Goal Formulation: Patient unable to participate in goal setting Time For Goal Achievement: 12/01/20 Potential to Achieve Goals: Fair  OT Frequency: Min 3X/week   Barriers to D/C:            Co-evaluation PT/OT/SLP Co-Evaluation/Treatment: Yes Reason for Co-Treatment: For patient/therapist safety;Necessary to address cognition/behavior during functional activity   OT goals addressed during session: ADL's and self-care;Strengthening/ROM      AM-PAC OT "6 Clicks" Daily Activity     Outcome Measure Help from another person eating meals?: Total Help from  another person taking care of personal grooming?: Total Help from another person toileting, which includes using toliet, bedpan, or urinal?: Total Help from another person bathing (including washing, rinsing, drying)?: Total Help from another person to put on and taking off regular upper body clothing?: Total Help from another person to put on and taking off regular lower body clothing?: Total 6 Click Score: 6   End of Session Nurse Communication: Mobility status  Activity Tolerance: Patient tolerated treatment well Patient left: in bed;with call bell/phone within reach;with bed alarm set;with nursing/sitter in room  OT Visit Diagnosis: Other abnormalities of gait and mobility (R26.89);Muscle weakness (generalized) (M62.81);Cognitive communication deficit (R41.841);Other symptoms and signs involving cognitive function Symptoms and signs involving cognitive functions: Cerebral infarction                Time: 1110-1140 OT Time Calculation (min): 30 min Charges:  OT General Charges $OT Visit: 1 Visit OT Evaluation $OT Eval Moderate Complexity: 1 Mod  Martie Round, OTR/L Acute Rehabilitation Services Pager: [phone] Office: [phone]  Evern Bio 11/17/2020, 1:44 PM

## 2020-11-17 NOTE — Progress Notes (Signed)
Physical Therapy Treatment Patient Details Name: Jesse [name] MRN: [id number] DOB: 1975/12/03 Today's Date: 11/17/2020    History of Present Illness Pt is a 45 y.o. male admitted 11/04/20 with acute onset aphasia, blurry vision. CTA revealed acute aortic dissection. Pt with worsening o2 requirements requiring intubation 7/15, self extubated 7/16. 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 Transferred to CIR 7/21-7/22 and returned to acute hospital after respiratory failure with intubation x 24 hours.   PMH: Anxiety, Depression, Essential hypertension.    PT Comments    Pt making slow progress with mobility. Pt with significant cognitive and communication deficits that are complicating mobility deficits.   Follow Up Recommendations  SNF     Equipment Recommendations  Wheelchair (measurements PT);Wheelchair cushion (measurements PT);Hospital bed;Rolling walker with 5" wheels    Recommendations for Other Services       Precautions / Restrictions Precautions Precautions: Fall;Sternal Precaution Comments: cortrak, rectal pouch Restrictions Weight Bearing Restrictions: No    Mobility  Bed Mobility Overal bed mobility: Needs Assistance Bed Mobility: Supine to Sit;Sit to Supine     Supine to sit: +2 for physical assistance;Max assist Sit to supine: +2 for physical assistance;Max assist   General bed mobility comments: Assist to elevate trunk into sitting. Assist to lower trunk and bring legs back into the bed when returning to supine. Pt's cognition is causing the level of assist he needs to be much greater.    Transfers Overall transfer level: Needs assistance Equipment used: 2 person hand held assist Transfers: Sit to/from Stand Sit to Stand: Mod assist;+2 physical assistance         General transfer comment: Assist to bring hips up and for balance. Incr time to rise.  Multimodal cues to initiate. Stood x 3 from bed  Ambulation/Gait Ambulation/Gait assistance: +2 physical assistance;Mod assist Gait Distance (Feet): 2 Feet (sidestepping) Assistive device: 2 person hand held assist Gait Pattern/deviations: Step-to pattern;Shuffle   Gait velocity interpretation: <1.31 ft/sec, indicative of household ambulator General Gait Details: Assist for balance and support to side step up side of bed   Stairs             Wheelchair Mobility    Modified Rankin (Stroke Patients Only) Modified Rankin (Stroke Patients Only) Pre-Morbid Rankin Score: No symptoms Modified Rankin: Severe disability     Balance Overall balance assessment: Needs assistance Sitting-balance support: No upper extremity supported;Feet supported Sitting balance-Leahy Scale: Poor Sitting balance - Comments: props on L UE   Standing balance support: Bilateral upper extremity supported;During functional activity Standing balance-Leahy Scale: Poor Standing balance comment: +2 min assist to maintain static standing once upright. Stood x 3 for 20-40 seconds                            Cognition Arousal/Alertness: Awake/alert Behavior During Therapy: Flat affect Overall Cognitive Status: Impaired/Different from baseline Area of Impairment: Following commands;Attention;Safety/judgement                   Current Attention Level: Focused   Following Commands:  (does not follow commands) Safety/Judgement: Decreased awareness of safety     General Comments: pt much more alert once assisted to EOB, requires mulitmodal cues to follow through with motor output      Exercises      General Comments        Pertinent Vitals/Pain Pain Assessment: Faces Faces Pain Scale:  No hurt    Home Living Family/patient expects to be discharged to:: Private residence Living Arrangements: Spouse/significant other;Children Available Help at Discharge: Family;Available 24  hours/day Type of Home: House Home Access: Stairs to enter Entrance Stairs-Rails: None Home Layout: Able to live on main level with bedroom/bathroom   Additional Comments: pt non-verbal; information from CIR charting    Prior Function Level of Independence: Independent      Comments: prior to initial admission 7/12   PT Goals (current goals can now be found in the care plan section) Acute Rehab PT Goals Patient Stated Goal: unable to state Progress towards PT goals: Progressing toward goals    Frequency    Min 3X/week      PT Plan Discharge plan needs to be updated;Frequency needs to be updated    Co-evaluation PT/OT/SLP Co-Evaluation/Treatment: Yes Reason for Co-Treatment: Necessary to address cognition/behavior during functional activity;For patient/therapist safety PT goals addressed during session: Mobility/safety with mobility;Balance OT goals addressed during session: ADL's and self-care;Strengthening/ROM      AM-PAC PT "6 Clicks" Mobility   Outcome Measure  Help needed turning from your back to your side while in a flat bed without using bedrails?: Total Help needed moving from lying on your back to sitting on the side of a flat bed without using bedrails?: Total Help needed moving to and from a bed to a chair (including a wheelchair)?: Total Help needed standing up from a chair using your arms (e.g., wheelchair or bedside chair)?: Total Help needed to walk in hospital room?: Total Help needed climbing 3-5 steps with a railing? : Total 6 Click Score: 6    End of Session   Activity Tolerance: Patient tolerated treatment well Patient left: in bed;with call bell/phone within reach;with nursing/sitter in room Nurse Communication: Mobility status PT Visit Diagnosis: Other symptoms and signs involving the nervous system (R29.898);Other abnormalities of gait and mobility (R26.89)     Time: 9242-6834 PT Time Calculation (min) (ACUTE ONLY): 26 min  Charges:   $Therapeutic Activity: 8-22 mins                     [facility] PT Acute Rehabilitation Services Pager [phone] Office [phone]    Angelina Ok [facility] 11/17/2020, 2:37 PM

## 2020-11-17 NOTE — Plan of Care (Signed)
Patient discharged off unit due to medical reasons.

## 2020-11-17 NOTE — Progress Notes (Signed)
PROGRESS NOTE    Jesse [name]  ZOX:[id number] DOB: [birth year] DOA: 11/14/2020 PCP: Pcp, No    No chief complaint on file.   Brief Narrative: 45 year old man with PMHx significant for anxiety, depression, tobacco and EtOH use initially admitted to [facility] 7/12 for chest pain radiating to his neck. Symptoms worsened to include acute receptive/expressive aphasia, unilateral blurry vision in R eye and AMS. Code Stroke called, initial CT Head negative; however, CTA revealed acute dissection involving the aortic arch, through the innominate artery and R common carotid artery.   He was taken emergently to the OR 7/12 for repair. Intubated for procedure, extubated same day. Given aphasia at the time of presentation, Neuro was consulted postoperatively for possible CVA post-dissection. 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 The following day (7/15), patient had increasing O2 requirements and was reintubated (extubated to [facility] 7/16). Postoperative course was additionally complicated by coagulopathy, L antecubital superficial venous thrombus and persistent hyperactive delirium (requiring Precedex). He was transferred to [facility] 7/21.  7/22 patient was found to be hypotensive and tachycardic.,  CODE BLUE was called patient was reintubated and was started on vasopressors and admitted by [facility].  Patient was transferred to [facility] on 11/17/2020.  Assessment & Plan:   Principal Problem:   Acute respiratory failure with hypoxia (HCC) Active Problems:   Respiratory arrest (HCC)   Adverse effects of medication   Hypotension   Encounter for central line placement   Pressure injury of skin  Acute hypoxic respiratory failure in the setting of stroke With respiratory suppression probably secondary to benzodiazepine administration versus COVID associated hypoxic respiratory arrest and aspiration pneumonia. Patient was intubated, extubated and currently on room air. PCCM recommended Rocephin for  7 days. Started the patient on remdesivir steroids and baricitinib.  Acute right MCA/PCA territory infarct Patient had expressive and receptive aphasia on admission. CT of the head on 7/14 demonstrating right MCA PCA territory infarct   Acute aortic dissection CTA on 11/04/2020 shows arctic dissection involving aortic arch, innominate artery and right common carotid artery S/p repair with T CTS on 11/04/2020  Acute encephalopathy secondary to stroke, respiratory failure Patient currently not following commands. Continue with valproic acid.  Avoid benzodiazepines.    Left brachial superficial venous thrombosis Currently on Lovenox.   Dysphagia/deconditioning Severe protein calorie malnutrition Patient currently has core track feeds. SLP on board.  Pressure injury Pressure Injury 11/14/20 Vertebral column Stage 1 -  Intact skin with non-blanchable redness of a localized area usually over a bony prominence. 3x4 area w/ scab on outside and pink center on bony prominince (Active)  11/14/20 0800  Location: Vertebral column  Location Orientation:   Staging: Stage 1 -  Intact skin with non-blanchable redness of a localized area usually over a bony prominence.  Wound Description (Comments): 3x4 area w/ scab on outside and pink center on bony prominince  Present on Admission: Yes  Wound care consulted.        DVT prophylaxis: Lovenox Code Status: Full code Family Communication: None at bedside Disposition:   Status is: Inpatient  Remains inpatient appropriate because:Ongoing diagnostic testing needed not appropriate for outpatient work up, Unsafe d/c plan, and IV treatments appropriate due to intensity of illness or inability to take PO  Dispo: The patient is from:  [facility]              Anticipated d/c is to:  PENDING.  Patient currently is not medically stable to d/c.   Difficult to place patient No       Consultants:  PCCM.   Procedures:   S/P aortic  dissection repair on 11/05/20    Antimicrobials:  Antibiotics Given (last 72 hours)     Date/Time Action Medication Dose Rate   11/14/20 1337 New Bag/Given   ceFEPIme (MAXIPIME) 2 g in sodium chloride 0.9 % 100 mL IVPB 2 g 200 mL/hr   11/14/20 2217 New Bag/Given   ceFEPIme (MAXIPIME) 2 g in sodium chloride 0.9 % 100 mL IVPB 2 g 200 mL/hr   11/15/20 0941 New Bag/Given   remdesivir 200 mg in sodium chloride 0.9% 250 mL IVPB 200 mg 580 mL/hr   11/15/20 1118 New Bag/Given   cefTRIAXone (ROCEPHIN) 2 g in sodium chloride 0.9 % 100 mL IVPB 2 g 200 mL/hr   11/16/20 0951 New Bag/Given   cefTRIAXone (ROCEPHIN) 2 g in sodium chloride 0.9 % 100 mL IVPB 2 g 200 mL/hr   11/16/20 0956 New Bag/Given   remdesivir 100 mg in sodium chloride 0.9 % 100 mL IVPB 100 mg 200 mL/hr   11/17/20 1020 New Bag/Given   cefTRIAXone (ROCEPHIN) 2 g in sodium chloride 0.9 % 100 mL IVPB 2 g 200 mL/hr   11/17/20 1138 New Bag/Given   remdesivir 100 mg in sodium chloride 0.9 % 100 mL IVPB 100 mg 200 mL/hr         Subjective: Is not following commands. Alert and appears comfortable.   Objective: Vitals:   11/17/20 1030 11/17/20 1100 11/17/20 1130 11/17/20 1141  BP: (!) 171/103 (!) 169/103    Pulse: (!) 174     Resp: 20  18   Temp:    98.6 F (37 C)  TempSrc:    Axillary  SpO2: 91%     Weight:        Intake/Output Summary (Last 24 hours) at 11/17/2020 1317 Last data filed at 11/17/2020 1138 Gross per 24 hour  Intake 1366.49 ml  Output 1700 ml  Net -333.51 ml   Filed Weights   11/15/20 0500 11/16/20 0500 11/17/20 0500  Weight: 68.8 kg 67.5 kg 67.5 kg    Examination:  General exam: Appears calm and comfortable  Respiratory system: Clear to auscultation. Respiratory effort normal. Cardiovascular system: S1 & S2 heard, RRR. No JVD, No pedal edema. Gastrointestinal system: Abdomen is nondistended, soft and nontender. Normal bowel sounds heard. Central nervous system: Alert , not oriented, confused. Not  following commands.  Extremities: no pedal edema.  Skin: No rashes Psychiatry: cannot be assessed.     Data Reviewed: I have personally reviewed following labs and imaging studies  CBC: Recent Labs  Lab 11/12/20 0422 11/13/20 0041 11/14/20 1229 11/14/20 1750 11/15/20 0918 11/16/20 0532 11/17/20 0047  WBC 7.0   < > 10.9* 12.0* 9.8 10.0 8.4  NEUTROABS 5.0  --   --   --   --   --   --   HGB 10.3*   < > 8.1* 8.5* 8.0* 8.2* 9.6*  HCT 31.5*   < > 25.7* 26.5* 25.1* 25.8* 29.4*  MCV 101.6*   < > 107.1* 104.3* 105.9* 103.6* 100.3*  PLT 191   < > 149* 78* 75* 91* 116*   < > = values in this interval not displayed.    Basic Metabolic Panel: Recent Labs  Lab 11/12/20 0422 11/13/20 0041 11/14/20 5009 11/14/20 3818 11/14/20 1229 11/14/20 2106 11/15/20 2993 11/15/20 7169 11/16/20 0532 11/17/20  0047  NA 135 136 135 136  --   --   --  134* 138 138  K 4.5 4.3 6.6* 6.2*   < > 5.0 5.0 4.4 4.0 4.3  CL 100 101  --  103  --   --   --  104 103 105  CO2 27 26  --  24  --   --   --  24 28 22   GLUCOSE 126* 136*  --  125*  --   --   --  182* 102* 91  BUN 17 21*  --  41*  --   --   --  44* 36* 41*  CREATININE 0.99 1.01  --  2.28*  --   --   --  1.56* 1.07 1.05  CALCIUM 8.7* 8.8*  --  7.3*  --   --   --  7.4* 8.2* 8.6*  MG 2.0 2.2  --  2.5*  --   --   --  2.6* 2.1 2.3  PHOS 4.0 4.5  --  4.9*  --   --   --   --  2.5 3.3   < > = values in this interval not displayed.    GFR: Estimated Creatinine Clearance: 83.9 mL/min (by C-G formula based on SCr of 1.05 mg/dL).  Liver Function Tests: Recent Labs  Lab 11/14/20 0854 11/15/20 0918  AST 127* 132*  ALT 46* 112*  ALKPHOS 77 78  BILITOT 0.4 0.3  PROT 5.1* 4.8*  ALBUMIN 2.3* 2.1*    CBG: Recent Labs  Lab 11/16/20 2020 11/16/20 2336 11/17/20 0410 11/17/20 0812 11/17/20 1142  GLUCAP 129* 133* 149* 149* 145*     Recent Results (from the past 240 hour(s))  Culture, Respiratory w Gram Stain     Status: None   Collection Time:  11/08/20  8:45 AM   Specimen: Tracheal Aspirate; Respiratory  Result Value Ref Range Status   Specimen Description TRACHEAL ASPIRATE  Final   Special Requests NONE  Final   Gram Stain   Final    ABUNDANT WBC PRESENT,BOTH PMN AND MONONUCLEAR RARE YEAST Performed at [facility], [address] [place], [place] [address]    Culture RARE CANDIDA ALBICANS  Final   Report Status 11/10/2020 FINAL  Final  Culture, blood (routine x 2)     Status: None   Collection Time: 11/11/20 11:00 AM   Specimen: BLOOD LEFT ARM  Result Value Ref Range Status   Specimen Description BLOOD LEFT ARM  Final   Special Requests   Final    BOTTLES DRAWN AEROBIC AND ANAEROBIC Blood Culture results may not be optimal due to an inadequate volume of blood received in culture bottles   Culture   Final    NO GROWTH 5 DAYS Performed at [facility], [address] [place], [place] [address]    Report Status 11/16/2020 FINAL  Final  Culture, blood (routine x 2)     Status: None   Collection Time: 11/11/20 11:06 AM   Specimen: BLOOD  Result Value Ref Range Status   Specimen Description BLOOD LEFT ANTECUBITAL  Final   Special Requests   Final    BOTTLES DRAWN AEROBIC ONLY Blood Culture results may not be optimal due to an inadequate volume of blood received in culture bottles   Culture   Final    NO GROWTH 5 DAYS Performed at [facility], [address] [place], [place] [address]  Report Status 11/16/2020 FINAL  Final  Culture, blood (routine x 2)     Status: None (Preliminary result)   Collection Time: 11/14/20  8:50 AM   Specimen: BLOOD  Result Value Ref Range Status   Specimen Description BLOOD SITE NOT SPECIFIED  Final   Special Requests   Final    BOTTLES DRAWN AEROBIC AND ANAEROBIC Blood Culture results may not be optimal due to an excessive volume of blood received in culture bottles   Culture   Final    NO GROWTH 3 DAYS Performed at [facility], [address],  [place], [place] [address]    Report Status PENDING  Incomplete  Culture, blood (routine x 2)     Status: None (Preliminary result)   Collection Time: 11/14/20  9:00 AM   Specimen: BLOOD  Result Value Ref Range Status   Specimen Description BLOOD RIGHT ANTECUBITAL  Final   Special Requests   Final    BOTTLES DRAWN AEROBIC ONLY Blood Culture adequate volume   Culture   Final    NO GROWTH 3 DAYS Performed at [facility], [address] [place], [place] [address]    Report Status PENDING  Incomplete  Urine Culture     Status: None   Collection Time: 11/14/20  9:40 AM   Specimen: Urine, Catheterized  Result Value Ref Range Status   Specimen Description URINE, CATHETERIZED  Final   Special Requests NONE  Final   Culture   Final    NO GROWTH Performed at [facility], [address] [place], [place] [address]    Report Status 11/15/2020 FINAL  Final  MRSA Next Gen by PCR, Nasal     Status: None   Collection Time: 11/14/20 12:29 PM   Specimen: Nasal Mucosa; Nasal Swab  Result Value Ref Range Status   MRSA by PCR Next Gen NOT DETECTED NOT DETECTED Final    Comment: (NOTE) The GeneXpert MRSA Assay (FDA approved for NASAL specimens only), is one component of a comprehensive MRSA colonization surveillance program. It is not intended to diagnose MRSA infection nor to guide or monitor treatment for MRSA infections. Test performance is not FDA approved in patients less than 73 years old. Performed at [facility], [address] [place], [place] [address]   SARS CORONAVIRUS 2 (TAT 6-24 HRS) Nasopharyngeal Nasopharyngeal Swab     Status: Abnormal   Collection Time: 11/15/20 12:57 AM   Specimen: Nasopharyngeal Swab  Result Value Ref Range Status   SARS Coronavirus 2 POSITIVE (A) NEGATIVE Final    Comment: (NOTE) SARS-CoV-2 target nucleic acids are DETECTED.  The SARS-CoV-2 RNA is generally detectable in upper and lower respiratory specimens during the acute phase of  infection. Positive results are indicative of the presence of SARS-CoV-2 RNA. Clinical correlation with patient history and other diagnostic information is  necessary to determine patient infection status. Positive results do not rule out bacterial infection or co-infection with other viruses.  The expected result is Negative.  Fact Sheet for Patients: HairSlick.no  Fact Sheet for Healthcare Providers: quierodirigir.com  This test is not yet approved or cleared by the [place] FDA and  has been authorized for detection and/or diagnosis of SARS-CoV-2 by FDA under an Emergency Use Authorization (EUA). This EUA will remain  in effect (meaning this test can be used) for the duration of the COVID-19 declaration under Section 564(b)(1) of the Act, 21 U. S.C. section 360bbb-3(b)(1), unless the authorization is  terminated or revoked sooner.   Performed at [facility], [address] [place], [place] [address]          Radiology Studies: No results found.      Scheduled Meds:  amLODipine  5 mg Per Tube Daily   vitamin C  500 mg Per Tube Daily   aspirin  81 mg Per Tube Daily   baricitinib  2 mg Per Tube Daily   Chlorhexidine Gluconate Cloth  6 each Topical Daily   dexamethasone (DECADRON) injection  6 mg Intravenous Q24H   enoxaparin (LOVENOX) injection  40 mg Subcutaneous Q12H   feeding supplement (PROSource TF)  45 mL Per Tube TID   free water  200 mL Per Tube Q8H   insulin aspart  0-15 Units Subcutaneous Q4H   mouth rinse  15 mL Mouth Rinse BID   pantoprazole (PROTONIX) IV  40 mg Intravenous QHS   QUEtiapine  50 mg Per Tube QHS   zinc sulfate  220 mg Per Tube Daily   Continuous Infusions:  sodium chloride Stopped (11/16/20 1115)   cefTRIAXone (ROCEPHIN)  IV 2 g (11/17/20 1020)   feeding supplement (OSMOLITE 1.5 CAL) 1,000 mL (11/15/20 0700)   remdesivir 100 mg in NS 100 mL 100 mg (11/17/20 1138)      LOS: 3 days        Kathlen Mody, MD [facility]   To contact the attending provider between 7A-7P or the covering provider during after hours 7P-7A, please log into the web site [email or web address] and access using universal [place] password for that web site. If you do not have the password, please call the hospital operator.  11/17/2020, 1:17 PM

## 2020-11-17 NOTE — Consult Note (Signed)
WOC Nurse Consult Note: Reason for Consult:Consulted for Stage 1 (blanching erythema) to vertebral column. This was noted on admission on 7/22. Wound type:pressure Pressure Injury POA: Yes Measurement:3cm x 4cm area Wound bed: No break in skin Drainage (amount, consistency, odor) None Periwound:intact Dressing procedure/placement/frequency: Area is being covered with a silicone foam and is stable, according to Bedside RN, A. Juul.  Orders provided for padding of the area with a silicone foam dressing. Other pressure injury prevention interventions are also in place such as turning and repositioning and floatation of heels.  WOC nursing team will not follow, but will remain available to this patient, the nursing and medical teams.  Please re-consult if needed. Thanks, Ladona Mow, MSN, RN, GNP, [place]  Pager# [phone]

## 2020-11-18 ENCOUNTER — Inpatient Hospital Stay (HOSPITAL_COMMUNITY): Payer: Medicaid Other

## 2020-11-18 LAB — BASIC METABOLIC PANEL
Anion gap: 11 (ref 5–15)
BUN: 53 mg/dL — ABNORMAL HIGH (ref 6–20)
CO2: 23 mmol/L (ref 22–32)
Calcium: 8.7 mg/dL — ABNORMAL LOW (ref 8.9–10.3)
Chloride: 108 mmol/L (ref 98–111)
Creatinine, Ser: 1.28 mg/dL — ABNORMAL HIGH (ref 0.61–1.24)
GFR, Estimated: >60 mL/min (ref >60)
Glucose, Bld: 109 mg/dL — ABNORMAL HIGH (ref 70–99)
Potassium: 3.9 mmol/L (ref 3.5–5.1)
Sodium: 142 mmol/L (ref 135–145)

## 2020-11-18 LAB — MAGNESIUM: Magnesium: 2.5 mg/dL — ABNORMAL HIGH (ref 1.7–2.4)

## 2020-11-18 LAB — CBC
HCT: 29 % — ABNORMAL LOW (ref 39.0–52.0)
Hemoglobin: 9.5 g/dL — ABNORMAL LOW (ref 13.0–17.0)
MCH: 32.6 pg (ref 26.0–34.0)
MCHC: 32.8 g/dL (ref 30.0–36.0)
MCV: 99.7 fL (ref 80.0–100.0)
Platelets: 200 10*3/uL (ref 150–400)
RBC: 2.91 MIL/uL — ABNORMAL LOW (ref 4.22–5.81)
RDW: 13.9 % (ref 11.5–15.5)
WBC: 7.5 10*3/uL (ref 4.0–10.5)
nRBC: 0.3 % — ABNORMAL HIGH (ref 0.0–0.2)

## 2020-11-18 LAB — GLUCOSE, CAPILLARY
Glucose-Capillary: 102 mg/dL — ABNORMAL HIGH (ref 70–99)
Glucose-Capillary: 111 mg/dL — ABNORMAL HIGH (ref 70–99)
Glucose-Capillary: 133 mg/dL — ABNORMAL HIGH (ref 70–99)
Glucose-Capillary: 137 mg/dL — ABNORMAL HIGH (ref 70–99)
Glucose-Capillary: 144 mg/dL — ABNORMAL HIGH (ref 70–99)
Glucose-Capillary: 148 mg/dL — ABNORMAL HIGH (ref 70–99)
Glucose-Capillary: 193 mg/dL — ABNORMAL HIGH (ref 70–99)

## 2020-11-18 LAB — SARS CORONAVIRUS 2 (TAT 6-24 HRS): SARS Coronavirus 2: POSITIVE — AB

## 2020-11-18 LAB — PHOSPHORUS: Phosphorus: 4.6 mg/dL (ref 2.5–4.6)

## 2020-11-18 MED ORDER — AMLODIPINE BESYLATE 10 MG PO TABS
10.0000 mg | ORAL_TABLET | Freq: Every day | ORAL | Status: DC
  Administered 2020-11-18 – 2020-11-21 (×4): 10 mg
  Filled 2020-11-18 (×4): qty 1

## 2020-11-18 MED ORDER — ENOXAPARIN SODIUM 40 MG/0.4ML IJ SOSY
40.0000 mg | PREFILLED_SYRINGE | Freq: Two times a day (BID) | INTRAMUSCULAR | Status: DC
  Administered 2020-11-18 – 2020-11-27 (×18): 40 mg via SUBCUTANEOUS
  Filled 2020-11-18 (×17): qty 0.4

## 2020-11-18 MED ORDER — HYDRALAZINE HCL 25 MG PO TABS
25.0000 mg | ORAL_TABLET | Freq: Three times a day (TID) | ORAL | Status: DC
  Administered 2020-11-18 – 2020-11-20 (×6): 25 mg
  Filled 2020-11-18 (×6): qty 1

## 2020-11-18 MED ORDER — HYDRALAZINE HCL 25 MG PO TABS: 25.0000 mg | ORAL_TABLET | Freq: Three times a day (TID) | ORAL | Status: DC

## 2020-11-18 MED ORDER — LABETALOL HCL 5 MG/ML IV SOLN
10.0000 mg | INTRAVENOUS | Status: DC | PRN
  Administered 2020-11-18 (×4): 10 mg via INTRAVENOUS
  Filled 2020-11-18 (×5): qty 4

## 2020-11-18 MED ORDER — LABETALOL HCL 5 MG/ML IV SOLN
10.0000 mg | INTRAVENOUS | Status: DC | PRN
  Administered 2020-11-19 (×2): 20 mg via INTRAVENOUS
  Administered 2020-11-19: 10 mg via INTRAVENOUS
  Administered 2020-11-20 (×2): 20 mg via INTRAVENOUS
  Filled 2020-11-18 (×5): qty 4

## 2020-11-18 MED ORDER — PANTOPRAZOLE SODIUM 40 MG PO PACK
40.0000 mg | PACK | Freq: Every day | ORAL | Status: DC
  Administered 2020-11-18 – 2020-11-21 (×4): 40 mg
  Filled 2020-11-18 (×4): qty 20

## 2020-11-18 NOTE — Progress Notes (Signed)
MD paged regarding patient exam of pupils being unequal 6 on left and 5 on right with no neuro exam changes from patient's baseline. He has expressive and receptive aphasia but is able to move all extremities. Seems to have a right sided preference but can look and move on the left side. Per prior dayshift RN and night shift RN the pupils look different from prior. Patient also has had uncontrolled BP today despite PRN administration and POs.    MD paged. Orders placed. Off going RN passed info to oncoming RN(s).

## 2020-11-18 NOTE — Plan of Care (Signed)
ON CALL NOTE  Called by on call hospitalist re: anisocoria about 61mm. No other neuro change on exam. Recommended CTH to be repeated. CT head reviewed. No new changes. Expected evolution of the stroke. No changes needed in the plan from neurology. Relayed plan to Dr. Loney Loh    -- Milon Dikes, MD Neurologist [facility] Pager: [phone]

## 2020-11-18 NOTE — Progress Notes (Signed)
PROGRESS NOTE    Jesse [name]  MVH:[id number] DOB: 1976/04/13 DOA: 11/14/2020 PCP: Pcp, No    No chief complaint on file.   Brief Narrative: 45 year old man with PMHx significant for anxiety, depression, tobacco and EtOH use initially admitted to [facility] 7/12 for chest pain radiating to his neck. Symptoms worsened to include acute receptive/expressive aphasia, unilateral blurry vision in R eye and AMS. Code Stroke called, initial CT Head negative; however, CTA revealed acute dissection involving the aortic arch, through the innominate artery and R common carotid artery.   He was taken emergently to the OR 7/12 for repair. Intubated for procedure, extubated same day. Given aphasia at the time of presentation, Neuro was consulted postoperatively for possible CVA post-dissection. 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 The following day (7/15), patient had increasing O2 requirements and was reintubated (extubated to [facility] 7/16). Postoperative course was additionally complicated by coagulopathy, L antecubital superficial venous thrombus and persistent hyperactive delirium (requiring Precedex). He was transferred to [facility] 7/21.  7/22 patient was found to be hypotensive and tachycardic.,  CODE BLUE was called patient was reintubated and was started on vasopressors and admitted by [facility].  Patient was transferred to [facility] on 11/17/2020. Pt seen and examined , overnight had elevated BP parameters.  Overnight pt agitated, and is currently in mittens.   Assessment & Plan:   Principal Problem:   Acute respiratory failure with hypoxia (HCC) Active Problems:   Respiratory arrest (HCC)   Adverse effects of medication   Hypotension   Encounter for central line placement   Pressure injury of skin  Acute hypoxic respiratory failure in the setting of stroke With respiratory suppression probably secondary to benzodiazepine administration versus COVID associated hypoxic respiratory arrest  and aspiration pneumonia. Patient was intubated, extubated and currently on room air. PCCM recommended Rocephin for 7 days. Started the patient on remdesivir steroids and baricitinib.  Complete the course of antibiotics.  Pt remains on RA not in distress.   Acute right MCA/PCA territory infarct Patient had expressive and receptive aphasia on admission. CT of the head on 7/14 demonstrating right MCA PCA territory infarct   Acute aortic dissection CTA on 11/04/2020 shows arctic dissection involving aortic arch, innominate artery and right common carotid artery S/p repair with T CTS on 11/04/2020  Acute encephalopathy secondary to stroke, respiratory failure Patient has significant cognitive deficits, not following commands.  Continue with valproic acid.  Avoid benzodiazepines.    Left brachial superficial venous thrombosis Currently on Lovenox.   Dysphagia/deconditioning Severe protein calorie malnutrition Patient currently has cortrack feeds. SLP Consulted.   Pressure injury Pressure Injury 11/14/20 Vertebral column Stage 1 -  Intact skin with non-blanchable redness of a localized area usually over a bony prominence. 3x4 area w/ scab on outside and pink center on bony prominince (Active)  11/14/20 0800  Location: Vertebral column  Location Orientation:   Staging: Stage 1 -  Intact skin with non-blanchable redness of a localized area usually over a bony prominence.  Wound Description (Comments): 3x4 area w/ scab on outside and pink center on bony prominince  Present on Admission: Yes  Wound care consulted.    Hypertension;  BP parameters elevated, increased the dose of amlodipine and added hydralazine 25 mg TID.     DVT prophylaxis: Lovenox Code Status: Full code  Family Communication: None at bedside, will call wife a nd update her.  Disposition:   Status is: Inpatient  Remains inpatient appropriate because:Ongoing diagnostic testing needed not  appropriate for  outpatient work up, Unsafe d/c plan, and IV treatments appropriate due to intensity of illness or inability to take PO  Dispo: The patient is from:  [facility]              Anticipated d/c is to:  PENDING.               Patient currently is not medically stable to d/c.   Difficult to place patient No       Consultants:  PCCM.   Procedures:   S/P aortic dissection repair on 11/05/20    Antimicrobials:  Antibiotics Given (last 72 hours)     Date/Time Action Medication Dose Rate   11/15/20 1118 New Bag/Given   cefTRIAXone (ROCEPHIN) 2 g in sodium chloride 0.9 % 100 mL IVPB 2 g 200 mL/hr   11/16/20 0951 New Bag/Given   cefTRIAXone (ROCEPHIN) 2 g in sodium chloride 0.9 % 100 mL IVPB 2 g 200 mL/hr   11/16/20 0956 New Bag/Given   remdesivir 100 mg in sodium chloride 0.9 % 100 mL IVPB 100 mg 200 mL/hr   11/17/20 1020 New Bag/Given   cefTRIAXone (ROCEPHIN) 2 g in sodium chloride 0.9 % 100 mL IVPB 2 g 200 mL/hr   11/17/20 1138 New Bag/Given   remdesivir 100 mg in sodium chloride 0.9 % 100 mL IVPB 100 mg 200 mL/hr   11/18/20 1015 New Bag/Given   cefTRIAXone (ROCEPHIN) 2 g in sodium chloride 0.9 % 100 mL IVPB 2 g 200 mL/hr         Subjective: He is on RA and appears comfortable.   Objective: Vitals:   11/18/20 0700 11/18/20 0730 11/18/20 0745 11/18/20 0800  BP: (!) 189/105 (!) 189/107  (!) 180/99  Pulse: 98 87  97  Resp: 14 12  15   Temp:   98.4 F (36.9 C)   TempSrc:   Axillary   SpO2: 94% 96% 96% 95%  Weight:        Intake/Output Summary (Last 24 hours) at 11/18/2020 1020 Last data filed at 11/18/2020 0600 Gross per 24 hour  Intake 1845.7 ml  Output 1550 ml  Net 295.7 ml    Filed Weights   11/16/20 0500 11/17/20 0500 11/18/20 0500  Weight: 67.5 kg 67.5 kg 66.2 kg    Examination:  General exam:  appears comfortable.  Respiratory system: Air entry fair, bilateral. No wheezing heard on RA.  Cardiovascular system: S1 & S2 heard, RRR no JVD, no pedl edema.   Gastrointestinal system: Abdomen is soft, NT ND BS+ Central nervous system: Alert  but significant cognitive deficits,  Extremities: no pedal edema.  Skin: No rashes Psychiatry: cannot be assessed.     Data Reviewed: I have personally reviewed following labs and imaging studies  CBC: Recent Labs  Lab 11/12/20 0422 11/13/20 0041 11/14/20 1750 11/15/20 0918 11/16/20 0532 11/17/20 0047 11/18/20 0110  WBC 7.0   < > 12.0* 9.8 10.0 8.4 7.5  NEUTROABS 5.0  --   --   --   --   --   --   HGB 10.3*   < > 8.5* 8.0* 8.2* 9.6* 9.5*  HCT 31.5*   < > 26.5* 25.1* 25.8* 29.4* 29.0*  MCV 101.6*   < > 104.3* 105.9* 103.6* 100.3* 99.7  PLT 191   < > 78* 75* 91* 116* 200   < > = values in this interval not displayed.     Basic Metabolic Panel: Recent Labs  Lab 11/13/20 0041 11/14/20  [id number] 11/14/20 [id number] 11/14/20 1229 11/15/20 0348 11/15/20 0918 11/16/20 0532 11/17/20 0047 11/18/20 0110  NA 136   < > 136  --   --  134* 138 138 142  K 4.3   < > 6.2*   < > 5.0 4.4 4.0 4.3 3.9  CL 101  --  103  --   --  104 103 105 108  CO2 26  --  24  --   --  24 28 22 23   GLUCOSE 136*  --  125*  --   --  182* 102* 91 109*  BUN 21*  --  41*  --   --  44* 36* 41* 53*  CREATININE 1.01  --  2.28*  --   --  1.56* 1.07 1.05 1.28*  CALCIUM 8.8*  --  7.3*  --   --  7.4* 8.2* 8.6* 8.7*  MG 2.2  --  2.5*  --   --  2.6* 2.1 2.3 2.5*  PHOS 4.5  --  4.9*  --   --   --  2.5 3.3 4.6   < > = values in this interval not displayed.     GFR: Estimated Creatinine Clearance: 67.5 mL/min (A) (by C-G formula based on SCr of 1.28 mg/dL (H)).  Liver Function Tests: Recent Labs  Lab 11/14/20 0854 11/15/20 0918  AST 127* 132*  ALT 46* 112*  ALKPHOS 77 78  BILITOT 0.4 0.3  PROT 5.1* 4.8*  ALBUMIN 2.3* 2.1*     CBG: Recent Labs  Lab 11/17/20 1638 11/17/20 2029 11/18/20 0014 11/18/20 0424 11/18/20 0740  GLUCAP 158* 91 148* 137* 111*      Recent Results (from the past 240 hour(s))  Culture, blood  (routine x 2)     Status: None   Collection Time: 11/11/20 11:00 AM   Specimen: BLOOD LEFT ARM  Result Value Ref Range Status   Specimen Description BLOOD LEFT ARM  Final   Special Requests   Final    BOTTLES DRAWN AEROBIC AND ANAEROBIC Blood Culture results may not be optimal due to an inadequate volume of blood received in culture bottles   Culture   Final    NO GROWTH 5 DAYS Performed at [facility], [address] [place], [place] [address]    Report Status 11/16/2020 FINAL  Final  Culture, blood (routine x 2)     Status: None   Collection Time: 11/11/20 11:06 AM   Specimen: BLOOD  Result Value Ref Range Status   Specimen Description BLOOD LEFT ANTECUBITAL  Final   Special Requests   Final    BOTTLES DRAWN AEROBIC ONLY Blood Culture results may not be optimal due to an inadequate volume of blood received in culture bottles   Culture   Final    NO GROWTH 5 DAYS Performed at [facility], [address] [place], [place] [address]    Report Status 11/16/2020 FINAL  Final  Culture, blood (routine x 2)     Status: None (Preliminary result)   Collection Time: 11/14/20  8:50 AM   Specimen: BLOOD  Result Value Ref Range Status   Specimen Description BLOOD SITE NOT SPECIFIED  Final   Special Requests   Final    BOTTLES DRAWN AEROBIC AND ANAEROBIC Blood Culture results may not be optimal due to an excessive volume of blood received in culture bottles   Culture   Final    NO GROWTH 4 DAYS Performed at [facility], [address], [place], [place] [address]    Report Status PENDING  Incomplete  Culture, blood (routine x 2)     Status: None (Preliminary result)   Collection Time: 11/14/20  9:00 AM   Specimen: BLOOD  Result Value Ref Range Status   Specimen Description BLOOD RIGHT ANTECUBITAL  Final   Special Requests   Final    BOTTLES DRAWN AEROBIC ONLY Blood Culture adequate volume   Culture   Final    NO GROWTH 4 DAYS Performed at [facility],  [address] [place], [place] [address]    Report Status PENDING  Incomplete  Urine Culture     Status: None   Collection Time: 11/14/20  9:40 AM   Specimen: Urine, Catheterized  Result Value Ref Range Status   Specimen Description URINE, CATHETERIZED  Final   Special Requests NONE  Final   Culture   Final    NO GROWTH Performed at [facility], [address], [place], [place] [address]    Report Status 11/15/2020 FINAL  Final  MRSA Next Gen by PCR, Nasal     Status: None   Collection Time: 11/14/20 12:29 PM   Specimen: Nasal Mucosa; Nasal Swab  Result Value Ref Range Status   MRSA by PCR Next Gen NOT DETECTED NOT DETECTED Final    Comment: (NOTE) The GeneXpert MRSA Assay (FDA approved for NASAL specimens only), is one component of a comprehensive MRSA colonization surveillance program. It is not intended to diagnose MRSA infection nor to guide or monitor treatment for MRSA infections. Test performance is not FDA approved in patients less than 58 years old. Performed at [facility], [address] [place], [place] [address]   SARS CORONAVIRUS 2 (TAT 6-24 HRS) Nasopharyngeal Nasopharyngeal Swab     Status: Abnormal   Collection Time: 11/15/20 12:57 AM   Specimen: Nasopharyngeal Swab  Result Value Ref Range Status   SARS Coronavirus 2 POSITIVE (A) NEGATIVE Final    Comment: (NOTE) SARS-CoV-2 target nucleic acids are DETECTED.  The SARS-CoV-2 RNA is generally detectable in upper and lower respiratory specimens during the acute phase of infection. Positive results are indicative of the presence of SARS-CoV-2 RNA. Clinical correlation with patient history and other diagnostic information is  necessary to determine patient infection status. Positive results do not rule out bacterial infection or co-infection with other viruses.  The expected result is Negative.  Fact Sheet for Patients: HairSlick.no  Fact Sheet for Healthcare  Providers: quierodirigir.com  This test is not yet approved or cleared by the [place] FDA and  has been authorized for detection and/or diagnosis of SARS-CoV-2 by FDA under an Emergency Use Authorization (EUA). This EUA will remain  in effect (meaning this test can be used) for the duration of the COVID-19 declaration under Section 564(b)(1) of the Act, 21 U. S.C. section 360bbb-3(b)(1), unless the authorization is terminated or revoked sooner.   Performed at [facility], [address] [place], [place] [address]           Radiology Studies: No results found.      Scheduled Meds:  amLODipine  10 mg Per Tube Daily   vitamin C  500 mg Per Tube Daily   aspirin  81 mg Per Tube Daily   baricitinib  2 mg Per Tube Daily   Chlorhexidine Gluconate Cloth  6 each Topical Daily   dexamethasone (DECADRON) injection  6 mg Intravenous Q24H  enoxaparin (LOVENOX) injection  40 mg Subcutaneous Q12H   feeding supplement (PROSource TF)  45 mL Per Tube TID   free water  200 mL Per Tube Q8H   hydrALAZINE  25 mg Per Tube Q8H   insulin aspart  0-15 Units Subcutaneous Q4H   mouth rinse  15 mL Mouth Rinse BID   pantoprazole sodium  40 mg Per Tube Daily   QUEtiapine  50 mg Per Tube QHS   zinc sulfate  220 mg Per Tube Daily   Continuous Infusions:  sodium chloride Stopped (11/16/20 1115)   cefTRIAXone (ROCEPHIN)  IV 2 g (11/18/20 1015)   feeding supplement (OSMOLITE 1.5 CAL) 1,000 mL (11/15/20 0700)   remdesivir 100 mg in NS 100 mL Stopped (11/17/20 1210)     LOS: 4 days        Kathlen Mody, MD [facility]   To contact the attending provider between 7A-7P or the covering provider during after hours 7P-7A, please log into the web site [email or web address] and access using universal [place] password for that web site. If you do not have the password, please call the hospital operator.  11/18/2020, 10:20 AM

## 2020-11-19 ENCOUNTER — Inpatient Hospital Stay (HOSPITAL_COMMUNITY): Payer: Medicaid Other

## 2020-11-19 LAB — GLUCOSE, CAPILLARY
Glucose-Capillary: 129 mg/dL — ABNORMAL HIGH (ref 70–99)
Glucose-Capillary: 145 mg/dL — ABNORMAL HIGH (ref 70–99)
Glucose-Capillary: 137 mg/dL — ABNORMAL HIGH (ref 70–99)
Glucose-Capillary: 107 mg/dL — ABNORMAL HIGH (ref 70–99)
Glucose-Capillary: 142 mg/dL — ABNORMAL HIGH (ref 70–99)

## 2020-11-19 LAB — CBC
HCT: 31.3 % — ABNORMAL LOW (ref 39.0–52.0)
Hemoglobin: 9.9 g/dL — ABNORMAL LOW (ref 13.0–17.0)
MCH: 32.6 pg (ref 26.0–34.0)
MCHC: 31.6 g/dL (ref 30.0–36.0)
MCV: 103 fL — ABNORMAL HIGH (ref 80.0–100.0)
Platelets: 251 K/uL (ref 150–400)
RBC: 3.04 MIL/uL — ABNORMAL LOW (ref 4.22–5.81)
RDW: 14.3 % (ref 11.5–15.5)
WBC: 7.3 K/uL (ref 4.0–10.5)
nRBC: 0 % (ref 0.0–0.2)

## 2020-11-19 LAB — BASIC METABOLIC PANEL WITH GFR
Anion gap: 10 (ref 5–15)
BUN: 63 mg/dL — ABNORMAL HIGH (ref 6–20)
CO2: 25 mmol/L (ref 22–32)
Calcium: 8.9 mg/dL (ref 8.9–10.3)
Chloride: 111 mmol/L (ref 98–111)
Creatinine, Ser: 1.52 mg/dL — ABNORMAL HIGH (ref 0.61–1.24)
GFR, Estimated: 57 mL/min — ABNORMAL LOW
Glucose, Bld: 99 mg/dL (ref 70–99)
Potassium: 3.9 mmol/L (ref 3.5–5.1)
Sodium: 146 mmol/L — ABNORMAL HIGH (ref 135–145)

## 2020-11-19 LAB — CULTURE, BLOOD (ROUTINE X 2)
Culture: NO GROWTH
Culture: NO GROWTH
Special Requests: ADEQUATE

## 2020-11-19 LAB — MAGNESIUM: Magnesium: 2.5 mg/dL — ABNORMAL HIGH (ref 1.7–2.4)

## 2020-11-19 LAB — PHOSPHORUS: Phosphorus: 4.5 mg/dL (ref 2.5–4.6)

## 2020-11-19 MED ORDER — FREE WATER
200.0000 mL | Status: DC
  Administered 2020-11-19 – 2020-11-21 (×12): 200 mL

## 2020-11-19 NOTE — Progress Notes (Signed)
PROGRESS NOTE    Jesse [name]  IRW:[id number] DOB: 1975/10/08 DOA: 11/14/2020 PCP: Pcp, No    No chief complaint on file.   Brief Narrative: 45 year old man with PMHx significant for anxiety, depression, tobacco and EtOH use initially admitted to [facility] 7/12 for chest pain radiating to his neck. Symptoms worsened to include acute receptive/expressive aphasia, unilateral blurry vision in R eye and AMS. Code Stroke called, initial CT Head negative; however, CTA revealed acute dissection involving the aortic arch, through the innominate artery and R common carotid artery.   He was taken emergently to the OR 7/12 for repair. Intubated for procedure, extubated same day. Given aphasia at the time of presentation, Neuro was consulted postoperatively for possible CVA post-dissection. 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 The following day (7/15), patient had increasing O2 requirements and was reintubated (extubated to [facility]NC 7/16). Postoperative course was additionally complicated by coagulopathy, L antecubital superficial venous thrombus and persistent hyperactive delirium (requiring Precedex). He was transferred to [facility] 7/21.  7/22 patient was found to be hypotensive and tachycardic.,  CODE BLUE was called patient was reintubated and was started on vasopressors and admitted by [facility].  Patient was transferred to [facility] on 11/17/2020. Pt seen and examined , overnight pt had a repeat CT head without contrast  showing expected evolution of the stroke.   Assessment & Plan:   Principal Problem:   Acute respiratory failure with hypoxia (HCC) Active Problems:   Respiratory arrest (HCC)   Adverse effects of medication   Hypotension   Encounter for central line placement   Pressure injury of skin  Acute hypoxic respiratory failure in the setting of stroke With respiratory suppression probably secondary to benzodiazepine administration versus COVID associated hypoxic respiratory  arrest and aspiration pneumonia. Patient was intubated, extubated and currently on room air. PCCM recommended Rocephin for 7 days. Started the patient on remdesivir steroids and baricitinib.  Complete the course of antibiotics.  Pt remains on RA not in distress.   Acute right MCA/PCA territory infarct Patient had expressive and receptive aphasia on admission. CT of the head on 7/14 demonstrating right MCA PCA territory infarct   Acute aortic dissection CTA on 11/04/2020 shows arctic dissection involving aortic arch, innominate artery and right common carotid artery S/p repair with T CTS on 11/04/2020  Acute encephalopathy secondary to stroke, respiratory failure Patient has significant cognitive deficits, not following commands.  Continue with valproic acid.  Avoid benzodiazepines.  Thrombocytopenia:  Resolved.   Left brachial superficial venous thrombosis Currently on Lovenox.   Mild AKI with hypernatremia . Probably from free water deficit.  Increased free water infusion to 200 ml every 4 hours.  Rpt BMP today.    Dysphagia/deconditioning Severe protein calorie malnutrition Patient currently has cortrack feeds. SLP Consulted, recommended NPO with ice chips and possible MBS later on.    Pressure injury Pressure Injury 11/14/20 Vertebral column Stage 1 -  Intact skin with non-blanchable redness of a localized area usually over a bony prominence. 3x4 area w/ scab on outside and pink center on bony prominince (Active)  11/14/20 0800  Location: Vertebral column  Location Orientation:   Staging: Stage 1 -  Intact skin with non-blanchable redness of a localized area usually over a bony prominence.  Wound Description (Comments): 3x4 area w/ scab on outside and pink center on bony prominince  Present on Admission: Yes  Wound care consulted.    Hypertension;  BP parameters are better controlled.  Increased the dose of  amlodipine and added hydralazine 25 mg TID.    Anemia  of acute illness:  Macrocytic, anemia panel ordered for further evaluation.     DVT prophylaxis: Lovenox Code Status: Full code  Family Communication: None at bedside, updated wife today.  Disposition:   Status is: Inpatient  Remains inpatient appropriate because:Ongoing diagnostic testing needed not appropriate for outpatient work up, Unsafe d/c plan, and IV treatments appropriate due to intensity of illness or inability to take PO  Dispo: The patient is from:  [facility]              Anticipated d/c is to: SNF              Patient currently is not medically stable to d/c.   Difficult to place patient No       Consultants:  PCCM.   Procedures:   S/P aortic dissection repair on 11/05/20    Antimicrobials:  Antibiotics Given (last 72 hours)     Date/Time Action Medication Dose Rate   11/17/20 1020 New Bag/Given   cefTRIAXone (ROCEPHIN) 2 g in sodium chloride 0.9 % 100 mL IVPB 2 g 200 mL/hr   11/17/20 1138 New Bag/Given   remdesivir 100 mg in sodium chloride 0.9 % 100 mL IVPB 100 mg 200 mL/hr   11/18/20 1015 New Bag/Given   cefTRIAXone (ROCEPHIN) 2 g in sodium chloride 0.9 % 100 mL IVPB 2 g 200 mL/hr   11/18/20 1055 New Bag/Given   remdesivir 100 mg in sodium chloride 0.9 % 100 mL IVPB 100 mg 200 mL/hr   11/19/20 1000 New Bag/Given   cefTRIAXone (ROCEPHIN) 2 g in sodium chloride 0.9 % 100 mL IVPB 2 g 200 mL/hr   11/19/20 1046 New Bag/Given   remdesivir 100 mg in sodium chloride 0.9 % 100 mL IVPB 100 mg 200 mL/hr         Subjective: He is more communicative today.   Objective: Vitals:   11/19/20 1330 11/19/20 1337 11/19/20 1400 11/19/20 1430  BP:  (!) 135/91    Pulse: (!) 102  (!) 56   Resp: 16  13 14   Temp:      TempSrc:      SpO2: 100%     Weight:        Intake/Output Summary (Last 24 hours) at 11/19/2020 1540 Last data filed at 11/19/2020 1400 Gross per 24 hour  Intake 1759.87 ml  Output 2250 ml  Net -490.13 ml    Filed Weights   11/17/20 0500  11/18/20 0500 11/19/20 0411  Weight: 67.5 kg 66.2 kg 63.6 kg    Examination:  General exam:  alert and comfortable.  Respiratory system: air entry fair, no wheezing heard. Cardiovascular system: RRR, no JVD, no pedal edema.  Gastrointestinal system: Abdomen is soft, non tender non distended bowel sounds good. Central nervous system: Alert , aphasia, able to move all extremities, able to follow some simple commands.  Extremities: no leg edema.  Skin: No rashes, stage 1 back.  Psychiatry: cannot be assessed.     Data Reviewed: I have personally reviewed following labs and imaging studies  CBC: Recent Labs  Lab 11/15/20 0918 11/16/20 0532 11/17/20 0047 11/18/20 0110 11/19/20 0119  WBC 9.8 10.0 8.4 7.5 7.3  HGB 8.0* 8.2* 9.6* 9.5* 9.9*  HCT 25.1* 25.8* 29.4* 29.0* 31.3*  MCV 105.9* 103.6* 100.3* 99.7 103.0*  PLT 75* 91* 116* 200 251     Basic Metabolic Panel: Recent Labs  Lab 11/14/20 0854 11/14/20 1229 11/15/20  [id number] 11/16/20 0532 11/17/20 0047 11/18/20 0110 11/19/20 0119  NA 136  --  134* 138 138 142 146*  K 6.2*   < > 4.4 4.0 4.3 3.9 3.9  CL 103  --  104 103 105 108 111  CO2 24  --  24 28 22 23 25   GLUCOSE 125*  --  182* 102* 91 109* 99  BUN 41*  --  44* 36* 41* 53* 63*  CREATININE 2.28*  --  1.56* 1.07 1.05 1.28* 1.52*  CALCIUM 7.3*  --  7.4* 8.2* 8.6* 8.7* 8.9  MG 2.5*  --  2.6* 2.1 2.3 2.5* 2.5*  PHOS 4.9*  --   --  2.5 3.3 4.6 4.5   < > = values in this interval not displayed.     GFR: Estimated Creatinine Clearance: 54.6 mL/min (A) (by C-G formula based on SCr of 1.52 mg/dL (H)).  Liver Function Tests: Recent Labs  Lab 11/14/20 0854 11/15/20 0918  AST 127* 132*  ALT 46* 112*  ALKPHOS 77 78  BILITOT 0.4 0.3  PROT 5.1* 4.8*  ALBUMIN 2.3* 2.1*     CBG: Recent Labs  Lab 11/18/20 1603 11/18/20 1945 11/18/20 2339 11/19/20 0408 11/19/20 0850  GLUCAP 193* 102* 133* 129* 145*      Recent Results (from the past 240 hour(s))  Culture,  blood (routine x 2)     Status: None   Collection Time: 11/11/20 11:00 AM   Specimen: BLOOD LEFT ARM  Result Value Ref Range Status   Specimen Description BLOOD LEFT ARM  Final   Special Requests   Final    BOTTLES DRAWN AEROBIC AND ANAEROBIC Blood Culture results may not be optimal due to an inadequate volume of blood received in culture bottles   Culture   Final    NO GROWTH 5 DAYS Performed at [facility], [address] [place], [place] [address]    Report Status 11/16/2020 FINAL  Final  Culture, blood (routine x 2)     Status: None   Collection Time: 11/11/20 11:06 AM   Specimen: BLOOD  Result Value Ref Range Status   Specimen Description BLOOD LEFT ANTECUBITAL  Final   Special Requests   Final    BOTTLES DRAWN AEROBIC ONLY Blood Culture results may not be optimal due to an inadequate volume of blood received in culture bottles   Culture   Final    NO GROWTH 5 DAYS Performed at [facility], [address] [place], [place] [address]    Report Status 11/16/2020 FINAL  Final  Culture, blood (routine x 2)     Status: None   Collection Time: 11/14/20  8:50 AM   Specimen: BLOOD  Result Value Ref Range Status   Specimen Description BLOOD SITE NOT SPECIFIED  Final   Special Requests   Final    BOTTLES DRAWN AEROBIC AND ANAEROBIC Blood Culture results may not be optimal due to an excessive volume of blood received in culture bottles   Culture   Final    NO GROWTH 5 DAYS Performed at [facility], [address] [place], [place] [address]    Report Status 11/19/2020 FINAL  Final  Culture, blood (routine x 2)     Status: None   Collection Time: 11/14/20  9:00 AM   Specimen: BLOOD  Result Value Ref Range Status   Specimen Description BLOOD RIGHT ANTECUBITAL  Final   Special Requests   Final    BOTTLES  DRAWN AEROBIC ONLY Blood Culture adequate volume   Culture   Final    NO GROWTH 5 DAYS Performed at [facility], [address] [place], [place]  [address]    Report Status 11/19/2020 FINAL  Final  Urine Culture     Status: None   Collection Time: 11/14/20  9:40 AM   Specimen: Urine, Catheterized  Result Value Ref Range Status   Specimen Description URINE, CATHETERIZED  Final   Special Requests NONE  Final   Culture   Final    NO GROWTH Performed at [facility], [address], [place], [place] [address]    Report Status 11/15/2020 FINAL  Final  MRSA Next Gen by PCR, Nasal     Status: None   Collection Time: 11/14/20 12:29 PM   Specimen: Nasal Mucosa; Nasal Swab  Result Value Ref Range Status   MRSA by PCR Next Gen NOT DETECTED NOT DETECTED Final    Comment: (NOTE) The GeneXpert MRSA Assay (FDA approved for NASAL specimens only), is one component of a comprehensive MRSA colonization surveillance program. It is not intended to diagnose MRSA infection nor to guide or monitor treatment for MRSA infections. Test performance is not FDA approved in patients less than 30 years old. Performed at [facility], [address] [place], [place] [address]   SARS CORONAVIRUS 2 (TAT 6-24 HRS) Nasopharyngeal Nasopharyngeal Swab     Status: Abnormal   Collection Time: 11/15/20 12:57 AM   Specimen: Nasopharyngeal Swab  Result Value Ref Range Status   SARS Coronavirus 2 POSITIVE (A) NEGATIVE Final    Comment: (NOTE) SARS-CoV-2 target nucleic acids are DETECTED.  The SARS-CoV-2 RNA is generally detectable in upper and lower respiratory specimens during the acute phase of infection. Positive results are indicative of the presence of SARS-CoV-2 RNA. Clinical correlation with patient history and other diagnostic information is  necessary to determine patient infection status. Positive results do not rule out bacterial infection or co-infection with other viruses.  The expected result is Negative.  Fact Sheet for Patients: HairSlick.no  Fact Sheet for Healthcare  Providers: quierodirigir.com  This test is not yet approved or cleared by the [place] FDA and  has been authorized for detection and/or diagnosis of SARS-CoV-2 by FDA under an Emergency Use Authorization (EUA). This EUA will remain  in effect (meaning this test can be used) for the duration of the COVID-19 declaration under Section 564(b)(1) of the Act, 21 U. S.C. section 360bbb-3(b)(1), unless the authorization is terminated or revoked sooner.   Performed at [facility], [address], [place], [place] [address]   SARS CORONAVIRUS 2 (TAT 6-24 HRS) Nasopharyngeal Nasopharyngeal Swab     Status: Abnormal   Collection Time: 11/18/20  4:40 PM   Specimen: Nasopharyngeal Swab  Result Value Ref Range Status   SARS Coronavirus 2 POSITIVE (A) NEGATIVE Final    Comment: (NOTE) SARS-CoV-2 target nucleic acids are DETECTED.  The SARS-CoV-2 RNA is generally detectable in upper and lower respiratory specimens during the acute phase of infection. Positive results are indicative of the presence of SARS-CoV-2 RNA. Clinical correlation with patient history and other diagnostic information is  necessary to determine patient infection status. Positive results do not rule out bacterial infection or co-infection with other viruses.  The expected result is Negative.  Fact Sheet for Patients: HairSlick.no  Fact Sheet for Healthcare Providers: quierodirigir.com  This test is not yet approved or cleared by the [place]  FDA and  has been authorized for detection and/or diagnosis of SARS-CoV-2 by FDA under an Emergency Use Authorization (EUA). This EUA will remain  in effect (meaning this test can be used) for the duration of the COVID-19 declaration under Section 564(b)(1) of the Act, 21 U. S.C. section 360bbb-3(b)(1), unless the authorization is terminated or revoked sooner.   Performed at [facility], [address], [place], [place] [address]           Radiology Studies: CT HEAD WO CONTRAST  Result Date: 11/18/2020 CLINICAL DATA:  Stroke follow-up EXAM: CT HEAD WITHOUT CONTRAST TECHNIQUE: Contiguous axial images were obtained from the base of the skull through the vertex without intravenous contrast. COMPARISON:  11/14/2020 FINDINGS: Brain: Evolving infarction of the right parietotemporal lobes and lateral right occipital lobe again identified. Edema has decreased with improvement and mild mass effect. As before, there are areas of apparent cortical sparing that probably reflect laminar necrosis or petechial hemorrhage. There is no discrete hematoma. There are new small bilateral cerebellar infarcts, some of which may been subtly present on the prior study. No hydrocephalus or extra-axial collection. Vascular: No hyperdense vessel. Skull: No new finding. Sinuses/Orbits: No acute finding. Other: None. IMPRESSION: Evolving posterior right MCA territory infarction with decreased edema and mild mass effect. No new hemorrhage. New acute or subacute small bilateral cerebellar infarcts. Some may have been subtly present on the prior CT. Electronically Signed   By: Guadlupe Spanish M.D.   On: 11/18/2020 20:36   DG Abd Portable 1V  Result Date: 11/18/2020 CLINICAL DATA:  Feeding tube placement. EXAM: PORTABLE ABDOMEN - 1 VIEW COMPARISON:  11/09/2020. FINDINGS: Feeding tube noted with tip over the stomach in stable position. No bowel distention. Stool noted throughout the colon. No free air. Degenerative change lumbar spine. IMPRESSION: Feeding tube noted with tip over the stomach in stable position. No bowel distention. Electronically Signed   By: Maisie Fus  Register   On: 11/18/2020 13:53        Scheduled Meds:  amLODipine  10 mg Per Tube Daily   vitamin C  500 mg Per Tube Daily   aspirin  81 mg Per Tube Daily   baricitinib  2 mg Per Tube Daily   Chlorhexidine Gluconate Cloth  6 each  Topical Daily   dexamethasone (DECADRON) injection  6 mg Intravenous Q24H   enoxaparin (LOVENOX) injection  40 mg Subcutaneous Q12H   feeding supplement (PROSource TF)  45 mL Per Tube TID   free water  200 mL Per Tube Q8H   hydrALAZINE  25 mg Per Tube Q8H   insulin aspart  0-15 Units Subcutaneous Q4H   mouth rinse  15 mL Mouth Rinse BID   pantoprazole sodium  40 mg Per Tube Daily   QUEtiapine  50 mg Per Tube QHS   zinc sulfate  220 mg Per Tube Daily   Continuous Infusions:  sodium chloride Stopped (11/16/20 1115)   feeding supplement (OSMOLITE 1.5 CAL) 60 mL/hr at 11/19/20 1400     LOS: 5 days        Kathlen Mody, MD [facility]   To contact the attending provider between 7A-7P or the covering provider during after hours 7P-7A, please log into the web site [email or web address] and access using universal [place] password for that web site. If you do not have the password, please call the hospital operator.  11/19/2020, 3:40 PM

## 2020-11-19 NOTE — Evaluation (Signed)
Clinical/Bedside Swallow Evaluation Patient Details  Name: Jesse [name] MRN: [id number] Date of Birth: November 16, 1975  Today's Date: 11/19/2020 Time: SLP Start Time (ACUTE ONLY): 1130 SLP Stop Time (ACUTE ONLY): 1201 SLP Time Calculation (min) (ACUTE ONLY): 31 min  Past Medical History:  Past Medical History:  Diagnosis Date   Anxiety    Depression    Essential hypertension    S/P aortic dissection repair 11/05/2020   Straight graft replacement of ascending aorta and proximal transverse aortic arch with re-suspension of native aortic valve and open hemi arch distal anastomosis with aorta to right common carotid bypass and aorta to right subclavian bypass   Past Surgical History:  Past Surgical History:  Procedure Laterality Date   ASCENDING AORTIC ROOT REPLACEMENT N/A 11/04/2020   Procedure: 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 WITH REPLACEMENT OF ASCENDING AORTA AND HEMIARCH USING HEMASHIELD PLATINUM GRAFT AND HEMASHIELD GOLD 14 X GRAFT, RESUSPENSION OF NATIVE VALVE, AORTA TO RIGHT CAROTID BYPASS, AORTA TO RIGHT SUBCLAVIAN BYPASS;  Surgeon: Purcell Nails, MD;  Location: [facility] OR;  Service: Open Heart Surgery;  Laterality: N/A;   TEE WITHOUT CARDIOVERSION N/A 11/04/2020   Procedure: TRANSESOPHAGEAL ECHOCARDIOGRAM (TEE);  Surgeon: Purcell Nails, MD;  Location: [facility] OR;  Service: Open Heart Surgery;  Laterality: N/A;   HPI:  45 year old man with PMHx significant for anxiety, depression, tobacco and EtOH use initially admitted to [facility] 7/12 for chest pain radiating to his neck. Symptoms worsened to include acute receptive/expressive aphasia, unilateral blurry vision in R eye and AMS. Code Stroke called, initial CT Head negative; however, CTA revealed acute dissection involving the aortic arch, through the innominate artery and R common carotid artery.     He was taken emergently to the OR 7/12 for repair. Intubated for procedure, extubated same day. Given aphasia at the time of  presentation, Neuro was consulted postoperatively for possible CVA post-dissection. 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 The following day (7/15), patient had increasing O2 requirements and was reintubated (extubated to HFNC 7/16). Postoperative course was additionally complicated by coagulopathy, L antecubital superficial venous thrombus and persistent hyperactive delirium (requiring Precedex). He was transferred to [facility] 7/21.     7/22 patient was found to be hypotensive and tachycardic.,  CODE BLUE was called patient was reintubated and was started on vasopressors and admitted by [facility]. Prior BSE on 11/12/20 recommending NPO; pt currently on TF for nutrition/hydration purposes.  Pt with improvements, so f/u BSE and speech/language evaluation completed per current orders.  Assessment / Plan / Recommendation Clinical Impression  Pt seen for clinical swallowing evaluation for re-assessment of swallow function.  Pt able to inconsistently follow 1-step directives and answer yes/no questions with 50% accuracy and use min gestures and non-verbal communication primarily to communicate.  Pt presents with oropharyngeal dysphagia including oral holding, decreased oral manipulation and propulsion, delayed initiation of the swallow with all consistencies including thin, ice chips, puree and solids.  Delayed cough/impaired mastication only observed post-solid consumption.  Due to multiple intubations this hospitalization and nature of CVA, silent aspiration cannot be ruled out, so an objective assessment recommended to determine safest consistency and r/o aspiration.  Pt currently has TF for nutrition/hydration purposes.  Recommend NPO until objective assessment can be completed with ice chips allowed given pre/post oral care and 1 offered at a time by nursing staff.  ST will f/u for objective assessment and dysphagia tx.  Thank you for this consult. SLP Visit Diagnosis: Dysphagia, oropharyngeal  phase (R13.12)    Aspiration Risk  Risk for inadequate nutrition/hydration;Moderate aspiration risk    Diet Recommendation NPO;Ice chips PRN after oral care   Medication Administration: Via alternative means    Other  Recommendations Oral Care Recommendations: Oral care QID;Oral care prior to ice chip/H20   Follow up Recommendations Other (comment) (TBD)      Frequency and Duration min 2x/week  1 week       Prognosis Prognosis for Safe Diet Advancement: Good Barriers to Reach Goals: Cognitive deficits      Swallow Study   General Date of Onset: 11/04/20 HPI: 45 year old man with PMHx significant for anxiety, depression, tobacco and EtOH use initially admitted to [facility] 7/12 for chest pain radiating to his neck. Symptoms worsened to include acute receptive/expressive aphasia, unilateral blurry vision in R eye and AMS. Code Stroke called, initial CT Head negative; however, CTA revealed acute dissection involving the aortic arch, through the innominate artery and R common carotid artery.     He was taken emergently to the OR 7/12 for repair. Intubated for procedure, extubated same day. Given aphasia at the time of presentation, Neuro was consulted postoperatively for possible CVA post-dissection. 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 The following day (7/15), patient had increasing O2 requirements and was reintubated (extubated to HFNC 7/16). Postoperative course was additionally complicated by coagulopathy, L antecubital superficial venous thrombus and persistent hyperactive delirium (requiring Precedex). He was transferred to [facility] 7/21.     7/22 patient was found to be hypotensive and tachycardic.,  CODE BLUE was called patient was reintubated and was started on vasopressors and admitted by [facility]. Type of Study: Bedside Swallow Evaluation Previous Swallow Assessment: 11/12/20 BSE recommended NPO Diet Prior to this Study: NPO Temperature Spikes Noted: No Respiratory  Status: Room air History of Recent Intubation: Yes Length of Intubations (days):  (6 total; 3 intubations over course of stay) Date extubated: 11/15/20 Behavior/Cognition: Alert;Confused;Requires cueing Oral Cavity Assessment: Dry Oral Care Completed by SLP: Yes Oral Cavity - Dentition: Edentulous Self-Feeding Abilities: Needs assist;Needs set up Patient Positioning: Upright in bed Baseline Vocal Quality: Low vocal intensity;Breathy Volitional Cough: Cognitively unable to elicit Volitional Swallow: Unable to elicit    Oral/Motor/Sensory Function Overall Oral Motor/Sensory Function: Generalized oral weakness Facial Symmetry: Other (Comment) (DTA d/t pt unable to consistently follow directives)   Ice Chips Ice chips: Impaired Presentation: Spoon Oral Phase Functional Implications: Oral holding Pharyngeal Phase Impairments: Suspected delayed Swallow   Thin Liquid Thin Liquid: Impaired Presentation: Cup;Spoon Oral Phase Impairments: Reduced lingual movement/coordination Oral Phase Functional Implications: Prolonged oral transit;Oral holding Pharyngeal  Phase Impairments: Suspected delayed Swallow    Nectar Thick Nectar Thick Liquid: Not tested   Honey Thick Honey Thick Liquid: Not tested   Puree Puree: Impaired Presentation: Spoon Oral Phase Functional Implications: Oral holding Pharyngeal Phase Impairments: Suspected delayed Swallow   Solid     Solid: Impaired Presentation: Spoon Oral Phase Impairments: Impaired mastication;Reduced lingual movement/coordination Oral Phase Functional Implications: Oral holding;Impaired mastication;Prolonged oral transit;Other (comment) (removed from oral cavity by SLP) Pharyngeal Phase Impairments: Suspected delayed Swallow;Cough - Delayed      Tressie Stalker, M.S., CCC-SLP 11/19/2020,12:54 PM

## 2020-11-19 NOTE — Evaluation (Signed)
Speech Language Pathology Evaluation Patient Details Name: Jesse [name] MRN: [id number] DOB: Jan 26, 1976 Today's Date: 11/19/2020 Time: 6010-9323 SLP Time Calculation (min) (ACUTE ONLY): 31 min  Problem List:  Patient Active Problem List   Diagnosis Date Noted   Pressure injury of skin 11/17/2020   Respiratory arrest (HCC) 11/14/2020   Acute respiratory failure with hypoxia (HCC) 11/14/2020   Adverse effects of medication 11/14/2020   Hypotension 11/14/2020   Encounter for central line placement    Aortic dissection (HCC) 11/13/2020   Acute ischemic right MCA stroke (HCC) 11/13/2020   Agitation 11/13/2020   Atelectasis    Phlebitis    Polysubstance abuse (HCC)    Elevated troponin 11/07/2020   New onset left bundle branch block (LBBB) 11/07/2020   Protein-calorie malnutrition, severe 11/07/2020   Encephalopathy acute    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)    Acute hypoxemic respiratory failure (HCC)    Acute thoracic aortic dissection (HCC) 11/05/2020   S/P aortic dissection repair 11/05/2020   Cerebral embolism with cerebral infarction 11/05/2020   Essential hypertension    Past Medical History:  Past Medical History:  Diagnosis Date   Anxiety    Depression    Essential hypertension    S/P aortic dissection repair 11/05/2020   Straight graft replacement of ascending aorta and proximal transverse aortic arch with re-suspension of native aortic valve and open hemi arch distal anastomosis with aorta to right common carotid bypass and aorta to right subclavian bypass   Past Surgical History:  Past Surgical History:  Procedure Laterality Date   ASCENDING AORTIC ROOT REPLACEMENT N/A 11/04/2020   Procedure: 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 WITH REPLACEMENT OF ASCENDING AORTA AND HEMIARCH USING HEMASHIELD PLATINUM GRAFT AND HEMASHIELD GOLD 14 X GRAFT, RESUSPENSION OF NATIVE VALVE, AORTA TO RIGHT CAROTID BYPASS, AORTA TO RIGHT  SUBCLAVIAN BYPASS;  Surgeon: Purcell Nails, MD;  Location: [facility] OR;  Service: Open Heart Surgery;  Laterality: N/A;   TEE WITHOUT CARDIOVERSION N/A 11/04/2020   Procedure: TRANSESOPHAGEAL ECHOCARDIOGRAM (TEE);  Surgeon: Purcell Nails, MD;  Location: [facility] OR;  Service: Open Heart Surgery;  Laterality: N/A;   HPI:  45 year old man with PMHx significant for anxiety, depression, tobacco and EtOH use initially admitted to [facility] 7/12 for chest pain radiating to his neck. Symptoms worsened to include acute receptive/expressive aphasia, unilateral blurry vision in R eye and AMS. Code Stroke called, initial CT Head negative; however, CTA revealed acute dissection involving the aortic arch, through the innominate artery and R common carotid artery.     He was taken emergently to the OR 7/12 for repair. Intubated for procedure, extubated same day. Given aphasia at the time of presentation, Neuro was consulted postoperatively for possible CVA post-dissection. 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 The following day (7/15), patient had increasing O2 requirements and was reintubated (extubated to HFNC 7/16). Postoperative course was additionally complicated by coagulopathy, L antecubital superficial venous thrombus and persistent hyperactive delirium (requiring Precedex). He was transferred to [facility] 7/21.     7/22 patient was found to be hypotensive and tachycardic.,  CODE BLUE was called patient was reintubated and was started on vasopressors and admitted by [facility]. SLE generated d/t expressive/receptive aphasia noted in chart review.  Pt unable to participate earlier during hospitalization for SLE (speech/language evaluation) d/t delirium/alertness level.  Assessment / Plan / Recommendation Clinical Impression  Pt seen for speech/language cognitive evaluation with deficits noted in receptive/expressive aphasia.  Pt able to follow 1-step directives with 50% accuracy given multimodal cues by SLP.  He  communicates primarily via non-verbal communication/gesturing min such as thumbs up when agreeing with comment/statement made by SLP.  He stated "That was awesome" after consumption of tsp thin liquid during BSE.  Pt only 30% accurate when naming confrontational items within room.  Nursing reporting inconsistency with following directions and becoming frustrated when attempting to communicate wants/needs.  Pt responded with 50% accuracy with yes/no questions during assessment.  Due to lack of verbalizations, pt was not able to state name or DOB or other orientation questions during assessment, but did use head nod when asked name/DOB and simple requests such as "Do you want some water?" With gestures noting basic comprehension such as opening mouth or leaning head forward to take various consistencies from spoon when offered by SLP.  Vocal quality noted to be low intensity and breathy during limited verbalizations.  ST will continue to f/u for speech/language deficits and dysphagia tx/objective assessment in acute setting.       SLP Assessment  SLP Recommendation/Assessment: Patient needs continued Speech Language Pathology Services SLP Visit Diagnosis: Dysphagia, oropharyngeal phase (R13.12)    Follow Up Recommendations  24 hour supervision/assistance;Skilled Nursing facility;Inpatient Rehab (TBD)    Frequency and Duration min 2x/week  1 week      SLP Evaluation Cognition  Overall Cognitive Status: Impaired/Different from baseline Arousal/Alertness: Awake/alert Orientation Level: Other (comment) (Pt unable to verbalize orientation questions, but responded with head nods to name/DOB and place.) Attention: Sustained Sustained Attention: Impaired Sustained Attention Impairment: Verbal basic;Functional basic Memory:  (DTA) Awareness: Impaired Problem Solving: Impaired Problem Solving Impairment: Verbal basic;Functional basic       Comprehension  Auditory Comprehension Overall Auditory  Comprehension: Impaired Yes/No Questions: Impaired Basic Biographical Questions: 0-25% accurate Commands: Impaired One Step Basic Commands: 50-74% accurate Conversation: Other (comment) (min verbalizations noted) EffectiveTechniques: Visual/Gestural cues;Repetition Visual Recognition/Discrimination Discrimination: Not tested Reading Comprehension Reading Status: Not tested    Expression Expression Primary Mode of Expression: Nonverbal - gestures Verbal Expression Overall Verbal Expression: Impaired Initiation: Impaired Level of Generative/Spontaneous Verbalization: Word Repetition: Impaired Level of Impairment: Word level Naming: Impairment Responsive: 0-25% accurate Confrontation: Impaired Convergent: 0-24% accurate Divergent: 0-24% accurate Pragmatics: Unable to assess Non-Verbal Means of Communication: Gestures Written Expression Written Expression: Not tested   Oral / Motor  Oral Motor/Sensory Function Overall Oral Motor/Sensory Function: Generalized oral weakness Facial Symmetry: Other (Comment) (DTA d/t pt being unable to consistently follow directives) Motor Speech Respiration: Within functional limits Phonation: Low vocal intensity;Breathy Intelligibility: Intelligibility reduced Word: 25-49% accurate Phrase: 0-24% accurate Sentence: 0-24% accurate Conversation: 0-24% accurate Motor Planning: Not tested                       Tressie Stalker, M.S., CCC-SLP 11/19/2020, 1:14 PM

## 2020-11-19 NOTE — Progress Notes (Signed)
Physical Therapy Treatment Patient Details Name: Jesse [name] MRN: [id number] DOB: [birth year] Today's Date: 11/19/2020    History of Present Illness Pt is a 45 y.o. male admitted 11/04/20 with acute onset aphasia, blurry vision. CTA revealed acute aortic dissection. Pt with worsening o2 requirements requiring intubation 7/15, self extubated 7/16. 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 Transferred to CIR 7/21-7/22 and returned to acute hospital after respiratory failure with intubation x 24 hours.   PMH: Anxiety, Depression, Essential hypertension.    PT Comments    Pt starting to show progress toward goals, limited most by global aphasia.  Emphasis on directing attention to the left overall, work on transitions to EOB, scooting, sitting balance, sit to stands, standing balance with/without IV pole, progression of gait/stability.  Left sitting in chair position in the bed with TV on and biased in his left field with patient attending to the HGTV program.    Follow Up Recommendations  SNF     Equipment Recommendations  Other (comment) (TBA)    Recommendations for Other Services       Precautions / Restrictions Precautions Precautions: Fall;Sternal Precaution Comments: cortrak, rectal pouch    Mobility  Bed Mobility Overal bed mobility: Needs Assistance Bed Mobility: Supine to Sit;Sit to Supine     Supine to sit: Mod assist (min assist mod directional cues) Sit to supine: Min assist;Mod assist (mod directional cues, minor assist)   General bed mobility comments: pt needing minimal truncal assist, mostly directional, but needing various cues for direction, initiation and follow through.    Transfers Overall transfer level: Needs assistance   Transfers: Sit to/from Stand Sit to Stand: Mod assist;+2 safety/equipment         General transfer comment: cues for  direction/initiation, min truncal assist  Ambulation/Gait Ambulation/Gait assistance: Mod assist;+2 safety/equipment Gait Distance (Feet): 80 Feet Assistive device: IV Pole;1 person hand held assist Gait Pattern/deviations: Step-through pattern   Gait velocity interpretation: <1.8 ft/sec, indicate of risk for recurrent falls General Gait Details: stability assist.  cues for attending to left, assist to direct activity/gait to the left.  pt mildly unsteady overall, with step through, but variable step width and midl stagger at times when pt not directing attension L, but asking him to walk left with tactile/vc's.   Stairs             Wheelchair Mobility    Modified Rankin (Stroke Patients Only) Modified Rankin (Stroke Patients Only) Modified Rankin: Moderately severe disability     Balance Overall balance assessment: Needs assistance Sitting-balance support: No upper extremity supported;Feet supported Sitting balance-Leahy Scale: Fair Sitting balance - Comments: did not challenge balance   Standing balance support: No upper extremity supported;Single extremity supported;During functional activity Standing balance-Leahy Scale: Poor Standing balance comment: reliant on 1 UE at this time, but much improved.                            Cognition Arousal/Alertness: Awake/alert Behavior During Therapy: Flat affect;WFL for tasks assessed/performed (some smiles and attempts to react to social cues) Overall Cognitive Status: Impaired/Different from baseline Area of Impairment: Following commands;Attention;Safety/judgement                   Current Attention Level: Focused;Sustained   Following Commands: Follows one step commands inconsistently;Follows one step commands with increased time Safety/Judgement: Decreased awareness of safety Awareness: Intellectual Problem Solving:  Slow processing;Decreased initiation;Difficulty sequencing;Requires tactile cues         Exercises      General Comments General comments (skin integrity, edema, etc.): HR rose into the mid 140's, sats in the 90's on RA,      Pertinent Vitals/Pain Pain Assessment: Faces Faces Pain Scale: No hurt Pain Intervention(s): Monitored during session    Home Living     Available Help at Discharge: Family;Available 24 hours/day                Prior Function            PT Goals (current goals can now be found in the care plan section) Acute Rehab PT Goals Patient Stated Goal: unable to state PT Goal Formulation: Patient unable to participate in goal setting Time For Goal Achievement: 11/29/20 Potential to Achieve Goals: Fair Progress towards PT goals: Progressing toward goals    Frequency    Min 3X/week      PT Plan Current plan remains appropriate    Co-evaluation              AM-PAC PT "6 Clicks" Mobility   Outcome Measure  Help needed turning from your back to your side while in a flat bed without using bedrails?: A Lot Help needed moving from lying on your back to sitting on the side of a flat bed without using bedrails?: A Lot Help needed moving to and from a bed to a chair (including a wheelchair)?: A Lot Help needed standing up from a chair using your arms (e.g., wheelchair or bedside chair)?: A Lot Help needed to walk in hospital room?: A Lot Help needed climbing 3-5 steps with a railing? : Total 6 Click Score: 11    End of Session   Activity Tolerance: Patient tolerated treatment well Patient left: in bed;with call bell/phone within reach;with bed alarm set;Other (comment) (in chair position.) Nurse Communication: Mobility status PT Visit Diagnosis: Other symptoms and signs involving the nervous system (R29.898);Other abnormalities of gait and mobility (R26.89)     Time: 4967-5916 PT Time Calculation (min) (ACUTE ONLY): 23 min  Charges:  $Gait Training: 8-22 mins $Therapeutic Activity: 8-22 mins                      11/19/2020  Jacinto Halim., PT Acute Rehabilitation Services [phone]  (pager) [phone]  (office)   Jesse [name] 11/19/2020, 2:14 PM

## 2020-11-19 NOTE — Progress Notes (Signed)
Inpatient Rehab Admissions Coordinator:   Following since readmit from CIR.  Note therapies now recommending SNF.  I will follow from a distance for any change in therapy recommendation or improved progress with therapies to indicate he may be able to return.    Estill Dooms, PT, DPT Admissions Coordinator [phone] 11/19/20  10:51 AM

## 2020-11-20 ENCOUNTER — Encounter (HOSPITAL_COMMUNITY): Payer: Self-pay | Admitting: Pulmonary Disease

## 2020-11-20 ENCOUNTER — Inpatient Hospital Stay (HOSPITAL_COMMUNITY): Payer: Medicaid Other

## 2020-11-20 DIAGNOSIS — N179 Acute kidney failure, unspecified: Secondary | ICD-10-CM

## 2020-11-20 LAB — CREATININE, URINE, RANDOM: Creatinine, Urine: 54.45 mg/dL

## 2020-11-20 LAB — GLUCOSE, CAPILLARY
Glucose-Capillary: 108 mg/dL — ABNORMAL HIGH (ref 70–99)
Glucose-Capillary: 110 mg/dL — ABNORMAL HIGH (ref 70–99)
Glucose-Capillary: 120 mg/dL — ABNORMAL HIGH (ref 70–99)
Glucose-Capillary: 164 mg/dL — ABNORMAL HIGH (ref 70–99)
Glucose-Capillary: 153 mg/dL — ABNORMAL HIGH (ref 70–99)
Glucose-Capillary: 139 mg/dL — ABNORMAL HIGH (ref 70–99)

## 2020-11-20 LAB — BASIC METABOLIC PANEL
Anion gap: 9 (ref 5–15)
BUN: 74 mg/dL — ABNORMAL HIGH (ref 6–20)
CO2: 25 mmol/L (ref 22–32)
Calcium: 8.9 mg/dL (ref 8.9–10.3)
Chloride: 112 mmol/L — ABNORMAL HIGH (ref 98–111)
Creatinine, Ser: 1.62 mg/dL — ABNORMAL HIGH (ref 0.61–1.24)
GFR, Estimated: 53 mL/min — ABNORMAL LOW (ref >60)
Glucose, Bld: 112 mg/dL — ABNORMAL HIGH (ref 70–99)
Potassium: 3.9 mmol/L (ref 3.5–5.1)
Sodium: 146 mmol/L — ABNORMAL HIGH (ref 135–145)

## 2020-11-20 LAB — VITAMIN B12: Vitamin B-12: 1741 pg/mL — ABNORMAL HIGH (ref 180–914)

## 2020-11-20 LAB — HEPATIC FUNCTION PANEL
ALT: 203 U/L — ABNORMAL HIGH (ref 0–44)
AST: 62 U/L — ABNORMAL HIGH (ref 15–41)
Albumin: 2.9 g/dL — ABNORMAL LOW (ref 3.5–5.0)
Alkaline Phosphatase: 113 U/L (ref 38–126)
Bilirubin, Direct: 0.2 mg/dL (ref 0.0–0.2)
Indirect Bilirubin: 0.3 mg/dL (ref 0.3–0.9)
Total Bilirubin: 0.5 mg/dL (ref 0.3–1.2)
Total Protein: 6.4 g/dL — ABNORMAL LOW (ref 6.5–8.1)

## 2020-11-20 LAB — CBC
HCT: 34.9 % — ABNORMAL LOW (ref 39.0–52.0)
Hemoglobin: 10.8 g/dL — ABNORMAL LOW (ref 13.0–17.0)
MCH: 32.6 pg (ref 26.0–34.0)
MCHC: 30.9 g/dL (ref 30.0–36.0)
MCV: 105.4 fL — ABNORMAL HIGH (ref 80.0–100.0)
Platelets: 321 10*3/uL (ref 150–400)
RBC: 3.31 MIL/uL — ABNORMAL LOW (ref 4.22–5.81)
RDW: 14.3 % (ref 11.5–15.5)
WBC: 7.3 10*3/uL (ref 4.0–10.5)
nRBC: 0 % (ref 0.0–0.2)

## 2020-11-20 LAB — PHOSPHORUS: Phosphorus: 4.8 mg/dL — ABNORMAL HIGH (ref 2.5–4.6)

## 2020-11-20 LAB — SODIUM, URINE, RANDOM: Sodium, Ur: 66 mmol/L

## 2020-11-20 LAB — MAGNESIUM: Magnesium: 2.6 mg/dL — ABNORMAL HIGH (ref 1.7–2.4)

## 2020-11-20 LAB — IRON AND TIBC
Iron: 42 ug/dL — ABNORMAL LOW (ref 45–182)
Saturation Ratios: 13 % — ABNORMAL LOW (ref 17.9–39.5)
TIBC: 319 ug/dL (ref 250–450)
UIBC: 277 ug/dL

## 2020-11-20 LAB — FOLATE: Folate: 27.7 ng/mL (ref >5.9)

## 2020-11-20 LAB — FERRITIN: Ferritin: 172 ng/mL (ref 24–336)

## 2020-11-20 MED ORDER — HYDRALAZINE HCL 50 MG PO TABS: 50.0000 mg | ORAL_TABLET | Freq: Three times a day (TID) | ORAL | Status: DC

## 2020-11-20 MED ORDER — CARVEDILOL 3.125 MG PO TABS
3.1250 mg | ORAL_TABLET | Freq: Two times a day (BID) | ORAL | Status: DC
  Administered 2020-11-20 – 2020-11-27 (×14): 3.125 mg via ORAL
  Filled 2020-11-20 (×14): qty 1

## 2020-11-20 MED ORDER — HYDRALAZINE HCL 50 MG PO TABS
50.0000 mg | ORAL_TABLET | Freq: Three times a day (TID) | ORAL | Status: DC
  Administered 2020-11-20: 50 mg
  Administered 2020-11-20: 25 mg
  Administered 2020-11-21 (×2): 50 mg
  Filled 2020-11-20 (×5): qty 1

## 2020-11-20 NOTE — Progress Notes (Signed)
Modified Barium Swallow Progress Note  Patient Details  Name: Jesse [name] MRN: [id number] Date of Birth: [birth year]  Today's Date: 11/20/2020  Modified Barium Swallow completed.  Full report located under Chart Review in the Imaging Section.  Brief recommendations include the following:  Clinical Impression  Pt presented with oropharyngeal dysphagia characterized by reduced bolus awareness, prolonged mastication, reduced bolus cohesion, and an intermittent pharyngeal delay. Mastication was prolonged with dysphagia 2 solids and all regular texture boluses were spat out or removed by the pt without any attempt at mastication. Pt demonstrated premature spillage to the valleculae and pyriform sinuses which, with the pharyngeal delay, resulted in two instances of penetration (PAS 2, 3) with thin liquids across 14 swallows of thin liquids via cup and straw. Both of these instances were noted when pt was using consecutive swallows. A wet vocal quality was noted throughout the study, but this did not align with any instances of laryngeal invasion. A dysphagia 1 diet with thin liquids will be initiated at this time. SLP will follow to ensure diet tolerance and for advancement as clinically indicated.   Swallow Evaluation Recommendations       SLP Diet Recommendations: Dysphagia 1 (Puree) solids;Thin liquid   Liquid Administration via: Cup;Straw   Medication Administration: Crushed with puree (or given via NGT)   Supervision: Staff to assist with self feeding;Full supervision/cueing for compensatory strategies   Compensations: Minimize environmental distractions;Small sips/bites;Slow rate   Postural Changes: Seated upright at 90 degrees   Oral Care Recommendations: Oral care BID      Niranjan Rufener I. Vear Clock, MS, CCC-SLP Acute Rehabilitation Services Office number [phone] Pager [phone]   Scheryl Marten 11/20/2020,3:42 PM

## 2020-11-20 NOTE — Progress Notes (Signed)
Occupational Therapy Treatment Patient Details Name: Jesse [name] MRN: [id number] DOB: 12/30/1975 Today's Date: 11/20/2020    History of present illness Pt is a 45 y.o. male admitted 11/04/20 with acute onset aphasia, blurry vision. CTA revealed acute aortic dissection. Pt with worsening o2 requirements requiring intubation 7/15, self extubated 7/16. 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 Transferred to CIR 7/21-7/22 and returned to acute hospital after respiratory failure with intubation x 24 hours.   PMH: Anxiety, Depression, Essential hypertension.   OT comments  Patient supine in bed listening to music upon entry.  Engaged in ADL grooming tasks, initally from upright in bed and transitioned to EOB.  Requires mod assist to transfer to/from EOB, sitting EOB with supervision.  Hand over hand assist for grooming tasks with repetition (to wash face and complete oral care "brushing") but with brushing hair patient able to sustain task attention and alternate hands with multimodal cueing to brush the left side. Able to scan towards L side, wave at therapist upon entry and give thumbs up to "how are you doing?".  Remains limited by aphasia.  Will follow acutely, if continues to progress may upgrade dc plan to CIR.    Follow Up Recommendations  SNF;Supervision/Assistance - 24 hour    Equipment Recommendations  Other (comment) (TBD)    Recommendations for Other Services      Precautions / Restrictions Precautions Precautions: Fall;Sternal Precaution Comments: cortrak Restrictions Other Position/Activity Restrictions: sternal       Mobility Bed Mobility Overal bed mobility: Needs Assistance Bed Mobility: Supine to Sit;Sit to Supine     Supine to sit: Mod assist Sit to supine: Mod assist   General bed mobility comments: mod assist to initate BLE and trunk support to/from EOB but  once initated able to complete task with min assist    Transfers                      Balance Overall balance assessment: Needs assistance Sitting-balance support: No upper extremity supported;Feet supported Sitting balance-Leahy Scale: Fair                                     ADL either performed or assessed with clinical judgement   ADL Overall ADL's : Needs assistance/impaired Eating/Feeding: NPO   Grooming: Maximal assistance;Sitting Grooming Details (indicate cue type and reason): upright in bed/EOB to engage in ADLs--using R hand with hand over hand support while completing washing face, brushing teeth using suction toothbrush and combing hair.  Requires full support to wash face (continues to bring cloth to mouth to eat), able to sustain grasp on toothbrush but assist to brush (vs chew), able to comb hair after initaal hand over hand using R hand then switching to L hand to get L side of head without cueing             Lower Body Dressing: Sitting/lateral leans;Total assistance               Functional mobility during ADLs: Moderate assistance;Cueing for safety;Cueing for sequencing       Vision   Vision Assessment?: Vision impaired- to be further tested in functional context Additional Comments: pt able to focus on therapist and scan to L and R, scan L when thearpist enters room.  Continue assessment   Perception  Praxis      Cognition Arousal/Alertness: Awake/alert Behavior During Therapy: Flat affect Overall Cognitive Status: Impaired/Different from baseline Area of Impairment: Following commands;Attention;Safety/judgement;Awareness;Problem solving                   Current Attention Level: Sustained   Following Commands: Follows one step commands inconsistently;Follows one step commands with increased time Safety/Judgement: Decreased awareness of deficits;Decreased awareness of safety Awareness:  Intellectual Problem Solving: Slow processing;Decreased initiation;Difficulty sequencing;Requires verbal cues;Requires tactile cues General Comments: pt waving to therapist upon entry, giving "thumb up" when asked "how are you?", following simple commands with increased time versus copying thearpist for thumbs up and waving.  Mulitmodal cueing and repetition for engagement of functional tasks.        Exercises     Shoulder Instructions       General Comments HR up to 120s with EOB activity    Pertinent Vitals/ Pain       Pain Assessment: Faces Faces Pain Scale: No hurt  Home Living                                          Prior Functioning/Environment              Frequency  Min 3X/week        Progress Toward Goals  OT Goals(current goals can now be found in the care plan section)  Progress towards OT goals: Progressing toward goals  Acute Rehab OT Goals Patient Stated Goal: unable to state OT Goal Formulation: Patient unable to participate in goal setting  Plan Discharge plan remains appropriate;Frequency remains appropriate    Co-evaluation                 AM-PAC OT "6 Clicks" Daily Activity     Outcome Measure   Help from another person eating meals?: Total Help from another person taking care of personal grooming?: A Lot Help from another person toileting, which includes using toliet, bedpan, or urinal?: Total Help from another person bathing (including washing, rinsing, drying)?: Total Help from another person to put on and taking off regular upper body clothing?: Total Help from another person to put on and taking off regular lower body clothing?: Total 6 Click Score: 7    End of Session    OT Visit Diagnosis: Other abnormalities of gait and mobility (R26.89);Muscle weakness (generalized) (M62.81);Cognitive communication deficit (R41.841);Other symptoms and signs involving cognitive function Symptoms and signs involving  cognitive functions: Cerebral infarction   Activity Tolerance Patient tolerated treatment well   Patient Left in bed;with call bell/phone within reach;with bed alarm set   Nurse Communication Mobility status        Time: 8250-5397 OT Time Calculation (min): 25 min  Charges: OT General Charges $OT Visit: 1 Visit OT Treatments $Self Care/Home Management : 23-37 mins  Barry Brunner, OT Acute Rehabilitation Services Pager [phone] Office [phone]    Chancy Milroy 11/20/2020, 1:41 PM

## 2020-11-20 NOTE — Progress Notes (Signed)
  Speech Language Pathology Treatment: Dysphagia  Patient Details Name: Jesse [name] MRN: [id number] DOB: 1976-04-26 Today's Date: 11/20/2020 Time: 2025-4270 SLP Time Calculation (min) (ACUTE ONLY): 14 min  Assessment / Plan / Recommendation Clinical Impression  Pt was seen for dysphagia treatment. He was alert and cooperative during the session without c/o pain. Pt tolerated puree and regular texture solids without overt s/sx of aspiration. Delayed coughing was noted intermittently with thin liquids following intake of large boluses via straw. Pt demonstrated impulsive tendencies throughout the session characterized by grabbing the cup from the SLP and drinking continuously from it. 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 A modified barium swallow study was recommended following the initial evaluation on 7/27 and it will be scheduled for today at 1400. Pt's nurse reported that the pt is most calm when music is playing and SLP has coordinated having the same Pandora station playing during the MBS this afternoon.    HPI HPI: Pt is a 45 y.o. male admitted 11/04/20 with acute onset aphasia, blurry vision. CTA revealed acute aortic dissection. Pt with worsening O2 requirements requiring intubation 7/15, self extubated 7/16. 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 CTH on 7/20 for unequal pupils revealed interval development of petechial hemorrhage and mild progression of edema and mass effect with 38mm leftward shift. He was transferred to [facility] 7/21 and 7/22 patient was found to be hypotensive and tachycardic. Code blue was called patient was reintubated. ETT 7/22-7/23. PMH: Anxiety, Depression, Essential hypertension.      SLP Plan  MBS       Recommendations  Diet recommendations: NPO Medication Administration: Via alternative means                 Oral Care Recommendations: Oral care BID Follow up Recommendations: Other (comment) (TBD) SLP Visit Diagnosis: Dysphagia, oropharyngeal phase (R13.12) Plan: MBS       Devron Cohick I. Vear Clock, MS, CCC-SLP Acute Rehabilitation Services Office number [phone] Pager [phone]               Scheryl Marten 11/20/2020, 1:24 PM

## 2020-11-20 NOTE — Progress Notes (Signed)
Pt arrived to rm 15 from 2H. ICHG given. initiated tele. Re start tube feeding at 60 ml/hr. Call bell within reach. Bed alarm on  Lawson Radar, RN

## 2020-11-20 NOTE — Progress Notes (Signed)
PROGRESS NOTE    Jesse [name]  ZOX:[id number] DOB: 1975/12/09 DOA: 11/14/2020 PCP: Pcp, No    No chief complaint on file.   Brief Narrative: 45 year old man with PMHx significant for anxiety, depression, tobacco and EtOH use initially admitted to [facility] 7/12 for chest pain radiating to his neck. Symptoms worsened to include acute receptive/expressive aphasia, unilateral blurry vision in R eye and AMS. Code Stroke called, initial CT Head negative; however, CTA revealed acute dissection involving the aortic arch, through the innominate artery and R common carotid artery.   He was taken emergently to the OR 7/12 for repair. Intubated for procedure, extubated same day. Given aphasia at the time of presentation, Neuro was consulted postoperatively for possible CVA post-dissection. 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 The following day (7/15), patient had increasing O2 requirements and was reintubated (extubated to [facility]NC 7/16). Postoperative course was additionally complicated by coagulopathy, L antecubital superficial venous thrombus and persistent hyperactive delirium (requiring Precedex). He was transferred to [facility] 7/21.  7/22 patient was found to be hypotensive and tachycardic.,  CODE BLUE was called patient was reintubated and was started on vasopressors and admitted by [facility].  Patient was transferred to [facility] on 11/17/2020. Pt seen and examined , overnight pt had a repeat CT head without contrast  showing expected evolution of the stroke. Pt not following commands at this time.   Assessment & Plan:   Principal Problem:   Acute respiratory failure with hypoxia (HCC) Active Problems:   Respiratory arrest (HCC)   Adverse effects of medication   Hypotension   Encounter for central line placement   Pressure injury of skin  Acute hypoxic respiratory failure in the setting of stroke With respiratory suppression probably secondary to benzodiazepine administration versus  COVID associated hypoxic respiratory arrest and aspiration pneumonia. Patient was intubated, extubated and currently on room air. PCCM recommended Rocephin for 7 days. Started the patient on remdesivir steroids and baricitinib. Complete the course of baricitinib and steroids.  Repeat covid test is positive.  CXR on 7/27 shows mild residual left basilar linear atelectasis.  Complete the course of antibiotics.  Pt remains on RA not in distress.   Acute right MCA/PCA territory infarct Patient had expressive and receptive aphasia on admission. CT of the head on 7/14 demonstrating right MCA PCA territory infarct   Acute aortic dissection CTA on 11/04/2020 shows arctic dissection involving aortic arch, innominate artery and right common carotid artery S/p repair with TCTS on 11/04/2020  Acute encephalopathy secondary to stroke, respiratory failure Patient has significant cognitive deficits, not following commands.  Continue with valproic acid.  Avoid benzodiazepines. SLP eval recommending NPO.   Thrombocytopenia:  Resolved.   Left brachial superficial venous thrombosis Currently on Lovenox.   Mild AKI with hypernatremia . Probably from free water deficit.  Increased free water infusion to 200 ml every 4 hours.  US renal ordered, along with urine for sodium and creatinine.    Dysphagia/deconditioning Severe protein calorie malnutrition Patient currently has cortrack feeds. SLP Consulted, recommended NPO with ice chips and possible MBS later on when pt starts to follow  commands.    Pressure injury Pressure Injury 11/14/20 Vertebral column Stage 1 -  Intact skin with non-blanchable redness of a localized area usually over a bony prominence. 3x4 area w/ scab on outside and pink center on bony prominince (Active)  11/14/20 0800  Location: Vertebral column  Location Orientation:   Staging: Stage 1 -  Intact skin with non-blanchable redness  of a localized area usually over a bony  prominence.  Wound Description (Comments): 3x4 area w/ scab on outside and pink center on bony prominince  Present on Admission: Yes  Wound care consulted.    Hypertension;  BP parameters are sub optimal.  Currently on amlodipine, increase hydralazine to 50 mg TID, added coreg 3.125 BID.     Anemia of acute illness:  Macrocytic,  Anemia panel shows low iron levels.  B12 Levels are adequate.     DVT prophylaxis: Lovenox Code Status: Full code  Family Communication: None at bedside, updated wife  on 11/19/20 Disposition:   Status is: Inpatient  Remains inpatient appropriate because:Ongoing diagnostic testing needed not appropriate for outpatient work up, Unsafe d/c plan, and IV treatments appropriate due to intensity of illness or inability to take PO  Dispo: The patient is from:  CIR              Anticipated d/c is to: SNF              Patient currently is not medically stable to d/c.   Difficult to place patient No       Consultants:  PCCM.   Procedures:   S/P aortic dissection repair on 11/05/20    Antimicrobials:  Antibiotics Given (last 72 hours)     Date/Time Action Medication Dose Rate   11/18/20 1015 New Bag/Given   cefTRIAXone (ROCEPHIN) 2 g in sodium chloride 0.9 % 100 mL IVPB 2 g 200 mL/hr   11/18/20 1055 New Bag/Given   remdesivir 100 mg in sodium chloride 0.9 % 100 mL IVPB 100 mg 200 mL/hr   11/19/20 1000 New Bag/Given   cefTRIAXone (ROCEPHIN) 2 g in sodium chloride 0.9 % 100 mL IVPB 2 g 200 mL/hr   11/19/20 1046 New Bag/Given   remdesivir 100 mg in sodium chloride 0.9 % 100 mL IVPB 100 mg 200 mL/hr         Subjective: Appears comfortable.   Objective: Vitals:   11/20/20 0605 11/20/20 0800 11/20/20 0934 11/20/20 1200  BP: (!) 152/105 (!) 153/117 140/88 (!) 155/108  Pulse:      Resp:      Temp:      TempSrc:      SpO2:      Weight:      Height:        Intake/Output Summary (Last 24 hours) at 11/20/2020 1226 Last data filed at  11/20/2020 1220 Gross per 24 hour  Intake 2220 ml  Output 2105 ml  Net 115 ml    Filed Weights   11/18/20 0500 11/19/20 0411 11/20/20 0310  Weight: 66.2 kg 63.6 kg 60.1 kg    Examination:  General exam:  alert and comfortable. Not in distress.  Respiratory system: Air entry fair, no wheezing heard.  Cardiovascular system: RRR no JVD, no pedal edema.  Gastrointestinal system: Abdomen is soft, NT ND BS+ Central nervous system: Alert , APHASIC, Not following commands.  Extremities: no leg edema.  Skin: No rashes, stage 1 back.  Psychiatry: cannot be assessed.     Data Reviewed: I have personally reviewed following labs and imaging studies  CBC: Recent Labs  Lab 11/16/20 0532 11/17/20 0047 11/18/20 0110 11/19/20 0119 11/20/20 0130  WBC 10.0 8.4 7.5 7.3 7.3  HGB 8.2* 9.6* 9.5* 9.9* 10.8*  HCT 25.8* 29.4* 29.0* 31.3* 34.9*  MCV 103.6* 100.3* 99.7 103.0* 105.4*  PLT 91* 116* 200 251 321     Basic Metabolic Panel: Recent Labs  Lab 11/16/20 0532 11/17/20 0047 11/18/20 0110 11/19/20 0119 11/20/20 0130  NA 138 138 142 146* 146*  K 4.0 4.3 3.9 3.9 3.9  CL 103 105 108 111 112*  CO2 28 22 23 25 25   GLUCOSE 102* 91 109* 99 112*  BUN 36* 41* 53* 63* 74*  CREATININE 1.07 1.05 1.28* 1.52* 1.62*  CALCIUM 8.2* 8.6* 8.7* 8.9 8.9  MG 2.1 2.3 2.5* 2.5* 2.6*  PHOS 2.5 3.3 4.6 4.5 4.8*     GFR: Estimated Creatinine Clearance: 48.4 mL/min (A) (by C-G formula based on SCr of 1.62 mg/dL (H)).  Liver Function Tests: Recent Labs  Lab 11/14/20 0854 11/15/20 0918 11/20/20 0130  AST 127* 132* 62*  ALT 46* 112* 203*  ALKPHOS 77 78 113  BILITOT 0.4 0.3 0.5  PROT 5.1* 4.8* 6.4*  ALBUMIN 2.3* 2.1* 2.9*     CBG: Recent Labs  Lab 11/19/20 2019 11/19/20 2355 11/20/20 0457 11/20/20 0816 11/20/20 1109  GLUCAP 142* 110* 108* 120* 164*      Recent Results (from the past 240 hour(s))  Culture, blood (routine x 2)     Status: None   Collection Time: 11/11/20 11:00 AM    Specimen: BLOOD LEFT ARM  Result Value Ref Range Status   Specimen Description BLOOD LEFT ARM  Final   Special Requests   Final    BOTTLES DRAWN AEROBIC AND ANAEROBIC Blood Culture results may not be optimal due to an inadequate volume of blood received in culture bottles   Culture   Final    NO GROWTH 5 DAYS Performed at [facility], [address] [place], [place] [address]    Report Status 11/16/2020 FINAL  Final  Culture, blood (routine x 2)     Status: None   Collection Time: 11/11/20 11:06 AM   Specimen: BLOOD  Result Value Ref Range Status   Specimen Description BLOOD LEFT ANTECUBITAL  Final   Special Requests   Final    BOTTLES DRAWN AEROBIC ONLY Blood Culture results may not be optimal due to an inadequate volume of blood received in culture bottles   Culture   Final    NO GROWTH 5 DAYS Performed at [facility], [address] [place], [place] [address]    Report Status 11/16/2020 FINAL  Final  Culture, blood (routine x 2)     Status: None   Collection Time: 11/14/20  8:50 AM   Specimen: BLOOD  Result Value Ref Range Status   Specimen Description BLOOD SITE NOT SPECIFIED  Final   Special Requests   Final    BOTTLES DRAWN AEROBIC AND ANAEROBIC Blood Culture results may not be optimal due to an excessive volume of blood received in culture bottles   Culture   Final    NO GROWTH 5 DAYS Performed at [facility], [address] [place], [place] [address]    Report Status 11/19/2020 FINAL  Final  Culture, blood (routine x 2)     Status: None   Collection Time: 11/14/20  9:00 AM   Specimen: BLOOD  Result Value Ref Range Status   Specimen Description BLOOD RIGHT ANTECUBITAL  Final   Special Requests   Final    BOTTLES DRAWN AEROBIC ONLY Blood Culture adequate volume   Culture   Final    NO GROWTH 5 DAYS Performed at [facility], [address] [place], [place] [address]    Report Status 11/19/2020 FINAL  Final  Urine  Culture      Status: None   Collection Time: 11/14/20  9:40 AM   Specimen: Urine, Catheterized  Result Value Ref Range Status   Specimen Description URINE, CATHETERIZED  Final   Special Requests NONE  Final   Culture   Final    NO GROWTH Performed at [facility], [address], [place], [place] [address]    Report Status 11/15/2020 FINAL  Final  MRSA Next Gen by PCR, Nasal     Status: None   Collection Time: 11/14/20 12:29 PM   Specimen: Nasal Mucosa; Nasal Swab  Result Value Ref Range Status   MRSA by PCR Next Gen NOT DETECTED NOT DETECTED Final    Comment: (NOTE) The GeneXpert MRSA Assay (FDA approved for NASAL specimens only), is one component of a comprehensive MRSA colonization surveillance program. It is not intended to diagnose MRSA infection nor to guide or monitor treatment for MRSA infections. Test performance is not FDA approved in patients less than 9 years old. Performed at [facility], [address] [place], [place] [address]   SARS CORONAVIRUS 2 (TAT 6-24 HRS) Nasopharyngeal Nasopharyngeal Swab     Status: Abnormal   Collection Time: 11/15/20 12:57 AM   Specimen: Nasopharyngeal Swab  Result Value Ref Range Status   SARS Coronavirus 2 POSITIVE (A) NEGATIVE Final    Comment: (NOTE) SARS-CoV-2 target nucleic acids are DETECTED.  The SARS-CoV-2 RNA is generally detectable in upper and lower respiratory specimens during the acute phase of infection. Positive results are indicative of the presence of SARS-CoV-2 RNA. Clinical correlation with patient history and other diagnostic information is  necessary to determine patient infection status. Positive results do not rule out bacterial infection or co-infection with other viruses.  The expected result is Negative.  Fact Sheet for Patients: HairSlick.no  Fact Sheet for Healthcare Providers: quierodirigir.com  This test is not yet approved or cleared by the  [place] FDA and  has been authorized for detection and/or diagnosis of SARS-CoV-2 by FDA under an Emergency Use Authorization (EUA). This EUA will remain  in effect (meaning this test can be used) for the duration of the COVID-19 declaration under Section 564(b)(1) of the Act, 21 U. S.C. section 360bbb-3(b)(1), unless the authorization is terminated or revoked sooner.   Performed at [facility], [address] [place], [place] [address]   SARS CORONAVIRUS 2 (TAT 6-24 HRS) Nasopharyngeal Nasopharyngeal Swab     Status: Abnormal   Collection Time: 11/18/20  4:40 PM   Specimen: Nasopharyngeal Swab  Result Value Ref Range Status   SARS Coronavirus 2 POSITIVE (A) NEGATIVE Final    Comment: (NOTE) SARS-CoV-2 target nucleic acids are DETECTED.  The SARS-CoV-2 RNA is generally detectable in upper and lower respiratory specimens during the acute phase of infection. Positive results are indicative of the presence of SARS-CoV-2 RNA. Clinical correlation with patient history and other diagnostic information is  necessary to determine patient infection status. Positive results do not rule out bacterial infection or co-infection with other viruses.  The expected result is Negative.  Fact Sheet for Patients: HairSlick.no  Fact Sheet for Healthcare Providers: quierodirigir.com  This test is not yet approved or cleared by the [place] FDA and  has been authorized for detection and/or diagnosis of SARS-CoV-2 by FDA under an Emergency Use Authorization (EUA). This EUA will remain  in effect (meaning this test can be used) for the duration of the COVID-19 declaration under Section 564(b)(1) of  the Act, 21 U. S.C. section 360bbb-3(b)(1), unless the authorization is terminated or revoked sooner.   Performed at [facility], [address] [place], [place] [address]           Radiology Studies: CT HEAD WO  CONTRAST  Result Date: 11/18/2020 CLINICAL DATA:  Stroke follow-up EXAM: CT HEAD WITHOUT CONTRAST TECHNIQUE: Contiguous axial images were obtained from the base of the skull through the vertex without intravenous contrast. COMPARISON:  11/14/2020 FINDINGS: Brain: Evolving infarction of the right parietotemporal lobes and lateral right occipital lobe again identified. Edema has decreased with improvement and mild mass effect. As before, there are areas of apparent cortical sparing that probably reflect laminar necrosis or petechial hemorrhage. There is no discrete hematoma. There are new small bilateral cerebellar infarcts, some of which may been subtly present on the prior study. No hydrocephalus or extra-axial collection. Vascular: No hyperdense vessel. Skull: No new finding. Sinuses/Orbits: No acute finding. Other: None. IMPRESSION: Evolving posterior right MCA territory infarction with decreased edema and mild mass effect. No new hemorrhage. New acute or subacute small bilateral cerebellar infarcts. Some may have been subtly present on the prior CT. Electronically Signed   By: Guadlupe Spanish M.D.   On: 11/18/2020 20:36   DG CHEST PORT 1 VIEW  Result Date: 11/19/2020 CLINICAL DATA:  Postoperative follow-up.  History of COVID positive. EXAM: PORTABLE CHEST 1 VIEW COMPARISON:  November 14, 2020 FINDINGS: The endotracheal tube and right internal jugular venous catheter seen on the prior study has been removed. A nasogastric tube is noted with its distal end extending into the body of the stomach. Mild linear atelectasis is seen along the medial aspect of the left lung base. There is no evidence of a pleural effusion or pneumothorax. Radiopaque surgical clips are seen overlying the superior mediastinum. The cardiac silhouette is mildly enlarged and unchanged in size. The visualized skeletal structures are unremarkable. IMPRESSION: Stable cardiomegaly with mild residual left basilar linear atelectasis. Electronically  Signed   By: Aram Candela M.D.   On: 11/19/2020 19:34   DG Abd Portable 1V  Result Date: 11/18/2020 CLINICAL DATA:  Feeding tube placement. EXAM: PORTABLE ABDOMEN - 1 VIEW COMPARISON:  11/09/2020. FINDINGS: Feeding tube noted with tip over the stomach in stable position. No bowel distention. Stool noted throughout the colon. No free air. Degenerative change lumbar spine. IMPRESSION: Feeding tube noted with tip over the stomach in stable position. No bowel distention. Electronically Signed   By: Maisie Fus  Register   On: 11/18/2020 13:53        Scheduled Meds:  amLODipine  10 mg Per Tube Daily   vitamin C  500 mg Per Tube Daily   aspirin  81 mg Per Tube Daily   baricitinib  2 mg Per Tube Daily   carvedilol  3.125 mg Oral BID WC   Chlorhexidine Gluconate Cloth  6 each Topical Daily   dexamethasone (DECADRON) injection  6 mg Intravenous Q24H   enoxaparin (LOVENOX) injection  40 mg Subcutaneous Q12H   feeding supplement (PROSource TF)  45 mL Per Tube TID   free water  200 mL Per Tube Q4H   hydrALAZINE  50 mg Per Tube Q8H   insulin aspart  0-15 Units Subcutaneous Q4H   mouth rinse  15 mL Mouth Rinse BID   pantoprazole sodium  40 mg Per Tube Daily   QUEtiapine  50 mg Per Tube QHS   zinc sulfate  220 mg Per Tube Daily   Continuous  Infusions:  sodium chloride Stopped (11/16/20 1115)   feeding supplement (OSMOLITE 1.5 CAL) 60 mL/hr at 11/20/20 0600     LOS: 6 days        Kathlen Mody, MD [facility]   To contact the attending provider between 7A-7P or the covering provider during after hours 7P-7A, please log into the web site [email or web address] and access using universal [place] password for that web site. If you do not have the password, please call the hospital operator.  11/20/2020, 12:26 PM

## 2020-11-21 LAB — GLUCOSE, CAPILLARY
Glucose-Capillary: 86 mg/dL (ref 70–99)
Glucose-Capillary: 123 mg/dL — ABNORMAL HIGH (ref 70–99)
Glucose-Capillary: 96 mg/dL (ref 70–99)
Glucose-Capillary: 162 mg/dL — ABNORMAL HIGH (ref 70–99)
Glucose-Capillary: 80 mg/dL (ref 70–99)
Glucose-Capillary: 78 mg/dL (ref 70–99)
Glucose-Capillary: 113 mg/dL — ABNORMAL HIGH (ref 70–99)

## 2020-11-21 MED ORDER — HYDRALAZINE HCL 50 MG PO TABS
50.0000 mg | ORAL_TABLET | Freq: Three times a day (TID) | ORAL | Status: DC
Start: 2020-11-21 — End: 2020-11-27
  Administered 2020-11-21 – 2020-11-27 (×18): 50 mg via ORAL
  Filled 2020-11-21 (×18): qty 1

## 2020-11-21 MED ORDER — ASPIRIN 81 MG PO CHEW
81.0000 mg | CHEWABLE_TABLET | Freq: Every day | ORAL | Status: DC
  Administered 2020-11-22 – 2020-11-27 (×6): 81 mg via ORAL
  Filled 2020-11-21 (×6): qty 1

## 2020-11-21 MED ORDER — PANTOPRAZOLE SODIUM 40 MG PO PACK
40.0000 mg | PACK | Freq: Every day | ORAL | Status: DC
  Administered 2020-11-23 – 2020-11-27 (×5): 40 mg via ORAL
  Filled 2020-11-21 (×6): qty 20

## 2020-11-21 MED ORDER — AMLODIPINE BESYLATE 10 MG PO TABS
10.0000 mg | ORAL_TABLET | Freq: Every day | ORAL | Status: DC
  Administered 2020-11-22 – 2020-11-27 (×6): 10 mg via ORAL
  Filled 2020-11-21 (×6): qty 1

## 2020-11-21 MED ORDER — ACETAMINOPHEN 160 MG/5ML PO SOLN: 650.0000 mg | Freq: Four times a day (QID) | ORAL | Status: DC | PRN

## 2020-11-21 MED ORDER — ASCORBIC ACID 500 MG PO TABS
500.0000 mg | ORAL_TABLET | Freq: Every day | ORAL | Status: DC
  Administered 2020-11-22 – 2020-11-27 (×6): 500 mg via ORAL
  Filled 2020-11-21 (×6): qty 1

## 2020-11-21 MED ORDER — BARICITINIB 2 MG PO TABS
2.0000 mg | ORAL_TABLET | Freq: Every day | ORAL | Status: DC
  Administered 2020-11-22 – 2020-11-27 (×6): 2 mg via ORAL
  Filled 2020-11-21 (×6): qty 1

## 2020-11-21 MED ORDER — POLYETHYLENE GLYCOL 3350 17 G PO PACK: 17.0000 g | PACK | Freq: Every day | ORAL | Status: DC | PRN

## 2020-11-21 MED ORDER — OXYCODONE HCL 5 MG/5ML PO SOLN: 5.0000 mg | ORAL | Status: DC | PRN

## 2020-11-21 MED ORDER — ZINC SULFATE 220 (50 ZN) MG PO CAPS
220.0000 mg | ORAL_CAPSULE | Freq: Every day | ORAL | Status: DC
  Administered 2020-11-22 – 2020-11-27 (×6): 220 mg via ORAL
  Filled 2020-11-21 (×5): qty 1

## 2020-11-21 MED ORDER — QUETIAPINE FUMARATE 25 MG PO TABS
50.0000 mg | ORAL_TABLET | Freq: Every day | ORAL | Status: DC
  Administered 2020-11-21 – 2020-11-26 (×6): 50 mg via ORAL
  Filled 2020-11-21 (×6): qty 2

## 2020-11-21 NOTE — Progress Notes (Signed)
Pt needs safety sitter due to impulsivity and lack of awareness of cognitive/physical limitations.  At this time we do not have sitter available for today or tonight. Spoke to pt's wife, [name], who said she would be willing and able to stay the night to help ensure pt's safety.  She needs to get some things arranged at home for kids, etc and will be here later this evening.

## 2020-11-21 NOTE — Progress Notes (Signed)
Pt's wife, [name], called requesting a meeting with MD including pt's brother and father.  Dr. Blake Divine aware and agreeable.  Will message MD when family arrives to 4E conference room. Family aware that, due to isolation restrictions, wife is pt's only permitted visitor to room.

## 2020-11-21 NOTE — Progress Notes (Signed)
Physical Therapy Treatment Patient Details Name: Jesse [name] MRN: [id number] DOB: Mar 11, 1976 Today's Date: 11/21/2020    History of Present Illness Pt is a 45 y.o. male admitted 11/04/20 with acute onset aphasia, blurry vision. CTA revealed acute aortic dissection. Pt with worsening o2 requirements requiring intubation 7/15, self extubated 7/16. 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 Transferred to CIR 7/21-7/22 and returned to acute hospital after respiratory failure with intubation x 24 hours.   PMH: Anxiety, Depression, Essential hypertension.    PT Comments    Pt continues to make steady progress with mobility. Communication continues to be difficult. Continue to recommend SNF since pt can't be considered for CIR with +covid.   Follow Up Recommendations  SNF     Equipment Recommendations  Other (comment) (TBA)    Recommendations for Other Services       Precautions / Restrictions Precautions Precautions: Fall;Sternal Precaution Comments: cortrak Restrictions Weight Bearing Restrictions: Yes RUE Weight Bearing:  (sternal precautions) LUE Weight Bearing:  (sternal precautions) Other Position/Activity Restrictions: sternal    Mobility  Bed Mobility               General bed mobility comments: Pt has sitting  EOB toward the end of the bed with the bed rail up and bed alarm going off. Obviously had gotten himself up to the EOB.    Transfers Overall transfer level: Needs assistance Equipment used: 1 person hand held assist Transfers: Sit to/from Stand Sit to Stand: Min assist;+2 safety/equipment         General transfer comment: Manual cues to initiate. Assist to bring hips up and for balance.  Ambulation/Gait Ambulation/Gait assistance: Mod assist;+2 safety/equipment Gait Distance (Feet): 100 Feet Assistive device: 1 person hand held assist Gait  Pattern/deviations: Step-through pattern;Decreased stride length;Staggering left;Staggering right Gait velocity: decr Gait velocity interpretation: <1.8 ft/sec, indicate of risk for recurrent falls General Gait Details: Pt with in lateral trunk sway at times causing staggering. Attempted rollling walker but pt not able to process how to use and kept letting go of walker   Stairs             Wheelchair Mobility    Modified Rankin (Stroke Patients Only) Modified Rankin (Stroke Patients Only) Pre-Morbid Rankin Score: No symptoms Modified Rankin: Moderately severe disability     Balance Overall balance assessment: Needs assistance Sitting-balance support: No upper extremity supported;Feet supported Sitting balance-Leahy Scale: Fair     Standing balance support: No upper extremity supported;Single extremity supported;During functional activity Standing balance-Leahy Scale: Poor Standing balance comment: UE support and min assist for static standing                            Cognition Arousal/Alertness: Awake/alert Behavior During Therapy: WFL for tasks assessed/performed Overall Cognitive Status: Difficult to assess                                 General Comments: Pt follows some general commands like stand up with gestures and manual cues      Exercises      General Comments General comments (skin integrity, edema, etc.): VSS on RA      Pertinent Vitals/Pain Pain Assessment: Faces Faces Pain Scale: No hurt    Home Living  Prior Function            PT Goals (current goals can now be found in the care plan section) Acute Rehab PT Goals Patient Stated Goal: unable to state PT Goal Formulation: Patient unable to participate in goal setting Time For Goal Achievement: 12/05/20 Potential to Achieve Goals: Good Progress towards PT goals: Goals met and updated - see care plan    Frequency    Min  3X/week      PT Plan Current plan remains appropriate    Co-evaluation              AM-PAC PT "6 Clicks" Mobility   Outcome Measure  Help needed turning from your back to your side while in a flat bed without using bedrails?: A Little Help needed moving from lying on your back to sitting on the side of a flat bed without using bedrails?: A Little Help needed moving to and from a bed to a chair (including a wheelchair)?: A Little Help needed standing up from a chair using your arms (e.g., wheelchair or bedside chair)?: A Little Help needed to walk in hospital room?: A Lot Help needed climbing 3-5 steps with a railing? : Total 6 Click Score: 15    End of Session Equipment Utilized During Treatment: Gait belt Activity Tolerance: Patient tolerated treatment well Patient left: Other (comment);in chair (NT present changing bed and bathing pt) Nurse Communication: Mobility status PT Visit Diagnosis: Other symptoms and signs involving the nervous system (R29.898);Other abnormalities of gait and mobility (R26.89)     Time: 0813-8871 PT Time Calculation (min) (ACUTE ONLY): 18 min  Charges:  $Gait Training: 8-22 mins                     [place] Pager [phone] Office [place] 11/21/2020, 10:13 AM

## 2020-11-21 NOTE — Plan of Care (Signed)

## 2020-11-21 NOTE — Progress Notes (Signed)
  Speech Language Pathology Treatment: Dysphagia  Patient Details Name: Jesse [name] MRN: [id number] DOB: 02/01/1976 Today's Date: 11/21/2020 Time: 3474-2595 SLP Time Calculation (min) (ACUTE ONLY): 19 min  Assessment / Plan / Recommendation Clinical Impression  Pt was seen for dysphagia tyreatment. Pt's nurse, Marchelle Folks, reported that the pt ate ~15% of his lunch, and did not like the food. She denied any signs of aspiration with p.o. intake. Pt was seated upright in chair upon SLP's arrival. He continues to demonstrate prolonged mastication with dysphagia 3 solids (meat on white bread) and periods of oral holding were intermittently noted with this consistency. Despite the extended (>5 minutes per single small bite) mastication, effectiveness was reduced; meats were unmasticated and semisolid pieces of bread were noted. Mastication was more functionally prolonged with dysphagia 2 solids, but length of bolus manipulation appeared prolonged secondary to impairments in attention. No s/sx of aspiration were noted with thin liquids or dysphagia 2 solids, but coughing was inconsistently observed with dysphagia 3. Pt's diet will be upgraded to dysphagia 2 solids with thin liquids. SLP will continue to follow pt.    HPI HPI: Pt is a 45 y.o. male admitted 11/04/20 with acute onset aphasia, blurry vision. CTA revealed acute aortic dissection. Pt with worsening O2 requirements requiring intubation 7/15, self extubated 7/16. 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 CTH on 7/20 for unequal pupils revealed interval development of petechial hemorrhage and mild progression of edema and mass effect with 26mm leftward shift. He was transferred to [facility] 7/21 and 7/22 patient was found to be hypotensive and tachycardic. Code blue was called patient was reintubated. ETT 7/22-7/23. PMH: Anxiety, Depression, Essential  hypertension.      SLP Plan  Continue with current plan of care       Recommendations  Diet recommendations: Dysphagia 2 (fine chop);Thin liquid Liquids provided via: Cup;Straw Medication Administration: Crushed with puree Supervision: Patient able to self feed;Full supervision/cueing for compensatory strategies Compensations: Minimize environmental distractions;Small sips/bites;Slow rate;Follow solids with liquid (check for pocketing) Postural Changes and/or Swallow Maneuvers: Seated upright 90 degrees                Oral Care Recommendations: Oral care BID Follow up Recommendations: Other (comment) (TBD) SLP Visit Diagnosis: Dysphagia, oropharyngeal phase (R13.12) Plan: Continue with current plan of care       Eevie Lapp I. Vear Clock, MS, CCC-SLP Acute Rehabilitation Services Office number [phone] Pager [phone]                Scheryl Marten 11/21/2020, 6:01 PM

## 2020-11-21 NOTE — Progress Notes (Signed)
PROGRESS NOTE    Jesse [name]  ZOX:[id number] DOB: 1976-03-16 DOA: 11/14/2020 PCP: Pcp, No    No chief complaint on file.   Brief Narrative: 45 year old man with PMHx significant for anxiety, depression, tobacco and EtOH use initially admitted to [facility] 7/12 for chest pain radiating to his neck. Symptoms worsened to include acute receptive/expressive aphasia, unilateral blurry vision in R eye and AMS. Code Stroke called, initial CT Head negative; however, CTA revealed acute dissection involving the aortic arch, through the innominate artery and R common carotid artery.   He was taken emergently to the OR 7/12 for repair. Intubated for procedure, extubated same day. Given aphasia at the time of presentation, Neuro was consulted postoperatively for possible CVA post-dissection. 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 The following day (7/15), patient had increasing O2 requirements and was reintubated (extubated to HFNC 7/16). Postoperative course was additionally complicated by coagulopathy, L antecubital superficial venous thrombus and persistent hyperactive delirium (requiring Precedex). He was transferred to [facility] 7/21.  7/22 patient was found to be hypotensive and tachycardic.,  CODE BLUE was called patient was reintubated and was started on vasopressors and admitted by [facility].  Patient was transferred to [facility] on 11/17/2020. Pt seen and examined , overnight pt had a repeat CT head without contrast  showing expected evolution of the stroke.  Pt seen and examined, able to hold a simple conversation, currenlty on dysphagia 1 diet.   Assessment & Plan:   Principal Problem:   Acute respiratory failure with hypoxia (HCC) Active Problems:   Respiratory arrest (HCC)   Adverse effects of medication   Hypotension   Encounter for central line placement   Pressure injury of skin  Acute hypoxic respiratory failure in the setting of stroke With respiratory suppression probably  secondary to benzodiazepine administration versus COVID associated hypoxic respiratory arrest and aspiration pneumonia. Patient was intubated, extubated and currently on room air. PCCM recommended Rocephin for 7 days. Started the patient on remdesivir steroids and baricitinib. Complete the course of baricitinib and steroids.  Repeat covid test is positive.  CXR on 7/27 shows mild residual left basilar linear atelectasis.  Complete the course of antibiotics.  Pt remains on RA not in distress.   Acute right MCA/PCA territory infarct Patient had expressive and receptive aphasia on admission. CT of the head on 7/14 demonstrating right MCA PCA territory infarct   Acute aortic dissection CTA on 11/04/2020 shows arctic dissection involving aortic arch, innominate artery and right common carotid artery S/p repair with TCTS on 11/04/2020  Acute encephalopathy secondary to stroke, respiratory failure Patient has significant cognitive deficits, not following commands.  Continue with valproic acid.  Avoid benzodiazepines. SLP eval recommending NPO.   Thrombocytopenia:  Resolved.   Left brachial superficial venous thrombosis Currently on Lovenox.   Mild AKI with hypernatremia . Probably from free water deficit.  Increased free water infusion to 200 ml every 4 hours.  US renal ordered, showed distended bladder and mild bilateral hydronephrosis.  Foley catheter to be placed. Recheck labs today.    Dysphagia/deconditioning Severe protein calorie malnutrition Patient currently has cortrack feeds. SLP re consulted, able to take in dysphagia 1 diet. Will dc tube feeds.    Pressure injury Pressure Injury 11/14/20 Vertebral column Stage 1 -  Intact skin with non-blanchable redness of a localized area usually over a bony prominence. 3x4 area w/ scab on outside and pink center on bony prominince (Active)  11/14/20 0800  Location: Vertebral column  Location  Orientation:   Staging: Stage 1 -   Intact skin with non-blanchable redness of a localized area usually over a bony prominence.  Wound Description (Comments): 3x4 area w/ scab on outside and pink center on bony prominince  Present on Admission: Yes  Wound care consulted.    Hypertension;  BP parameters are improving.  Currently on amlodipine, increase hydralazine to 50 mg TID, added coreg 3.125 BID.     Anemia of acute illness:  Macrocytic,  Anemia panel shows low iron levels.  B12 Levels are adequate.     DVT prophylaxis: Lovenox Code Status: Full code  Family Communication: None at bedside, updated wife  on 11/19/20 Disposition:   Status is: Inpatient  Remains inpatient appropriate because:Ongoing diagnostic testing needed not appropriate for outpatient work up, Unsafe d/c plan, and IV treatments appropriate due to intensity of illness or inability to take PO  Dispo: The patient is from:  [facility]              Anticipated d/c is to: SNF              Patient currently is not medically stable to d/c.   Difficult to place patient No       Consultants:  PCCM.   Procedures:   S/P aortic dissection repair on 11/05/20    Antimicrobials:  Antibiotics Given (last 72 hours)     Date/Time Action Medication Dose Rate   11/19/20 1000 New Bag/Given   cefTRIAXone (ROCEPHIN) 2 g in sodium chloride 0.9 % 100 mL IVPB 2 g 200 mL/hr   11/19/20 1046 New Bag/Given   remdesivir 100 mg in sodium chloride 0.9 % 100 mL IVPB 100 mg 200 mL/hr         Subjective: Comfortable, no new complaints.  Large BM earlier this am.   Objective: Vitals:   11/21/20 0432 11/21/20 0506 11/21/20 1242 11/21/20 1557  BP: (!) 143/100  (!) 152/115 (!) 134/99  Pulse: (!) 103   (!) 103  Resp: 16  15 19   Temp: 98.8 F (37.1 C)  98.8 F (37.1 C) 97.8 F (36.6 C)  TempSrc: Oral  Oral Axillary  SpO2: 98%  99% 96%  Weight:  60.3 kg    Height:        Intake/Output Summary (Last 24 hours) at 11/21/2020 1658 Last data filed at 11/21/2020  1300 Gross per 24 hour  Intake 1440 ml  Output 1125 ml  Net 315 ml    Filed Weights   11/19/20 0411 11/20/20 0310 11/21/20 0506  Weight: 63.6 kg 60.1 kg 60.3 kg    Examination:  General exam:  alert and comfortable, on RA.  Respiratory system: CLEAR to auscultation, no wheezing heard.  Cardiovascular system: RRR, no JVD, no pedal edema.  Gastrointestinal system: Abdomen is soft, supra pubic fullness. No tenderness.  Central nervous system: alert and able to follow some commands.eating  Extremities: no leg edema.  Skin: stage 1 back pressure injury.   Psychiatry: restless, anxious.     Data Reviewed: I have personally reviewed following labs and imaging studies  CBC: Recent Labs  Lab 11/16/20 0532 11/17/20 0047 11/18/20 0110 11/19/20 0119 11/20/20 0130  WBC 10.0 8.4 7.5 7.3 7.3  HGB 8.2* 9.6* 9.5* 9.9* 10.8*  HCT 25.8* 29.4* 29.0* 31.3* 34.9*  MCV 103.6* 100.3* 99.7 103.0* 105.4*  PLT 91* 116* 200 251 321     Basic Metabolic Panel: Recent Labs  Lab 11/16/20 0532 11/17/20 0047 11/18/20 0110 11/19/20 0119 11/20/20 0130  NA 138 138 142 146* 146*  K 4.0 4.3 3.9 3.9 3.9  CL 103 105 108 111 112*  CO2 28 22 23 25 25   GLUCOSE 102* 91 109* 99 112*  BUN 36* 41* 53* 63* 74*  CREATININE 1.07 1.05 1.28* 1.52* 1.62*  CALCIUM 8.2* 8.6* 8.7* 8.9 8.9  MG 2.1 2.3 2.5* 2.5* 2.6*  PHOS 2.5 3.3 4.6 4.5 4.8*     GFR: Estimated Creatinine Clearance: 48.6 mL/min (A) (by C-G formula based on SCr of 1.62 mg/dL (H)).  Liver Function Tests: Recent Labs  Lab 11/15/20 0918 11/20/20 0130  AST 132* 62*  ALT 112* 203*  ALKPHOS 78 113  BILITOT 0.3 0.5  PROT 4.8* 6.4*  ALBUMIN 2.1* 2.9*     CBG: Recent Labs  Lab 11/21/20 0102 11/21/20 0430 11/21/20 0848 11/21/20 1242 11/21/20 1545  GLUCAP 86 123* 96 162* 80      Recent Results (from the past 240 hour(s))  Culture, blood (routine x 2)     Status: None   Collection Time: 11/14/20  8:50 AM   Specimen: BLOOD   Result Value Ref Range Status   Specimen Description BLOOD SITE NOT SPECIFIED  Final   Special Requests   Final    BOTTLES DRAWN AEROBIC AND ANAEROBIC Blood Culture results may not be optimal due to an excessive volume of blood received in culture bottles   Culture   Final    NO GROWTH 5 DAYS Performed at [facility], [address] [place], [place]    Report Status 11/19/2020 FINAL  Final  Culture, blood (routine x 2)     Status: None   Collection Time: 11/14/20  9:00 AM   Specimen: BLOOD  Result Value Ref Range Status   Specimen Description BLOOD RIGHT ANTECUBITAL  Final   Special Requests   Final    BOTTLES DRAWN AEROBIC ONLY Blood Culture adequate volume   Culture   Final    NO GROWTH 5 DAYS Performed at [facility], [address], [place], [place]    Report Status 11/19/2020 FINAL  Final  Urine Culture     Status: None   Collection Time: 11/14/20  9:40 AM   Specimen: Urine, Catheterized  Result Value Ref Range Status   Specimen Description URINE, CATHETERIZED  Final   Special Requests NONE  Final   Culture   Final    NO GROWTH Performed at [facility], [address], [place], [place]    Report Status 11/15/2020 FINAL  Final  MRSA Next Gen by PCR, Nasal     Status: None   Collection Time: 11/14/20 12:29 PM   Specimen: Nasal Mucosa; Nasal Swab  Result Value Ref Range Status   MRSA by PCR Next Gen NOT DETECTED NOT DETECTED Final    Comment: (NOTE) The GeneXpert MRSA Assay (FDA approved for NASAL specimens only), is one component of a comprehensive MRSA colonization surveillance program. It is not intended to diagnose MRSA infection nor to guide or monitor treatment for MRSA infections. Test performance is not FDA approved in patients less than 32 years old. Performed at [facility], [address] [place], [place]   SARS CORONAVIRUS 2 (TAT 6-24 HRS) Nasopharyngeal Nasopharyngeal Swab     Status:  Abnormal   Collection Time: 11/15/20 12:57 AM   Specimen: Nasopharyngeal Swab  Result Value Ref Range Status   SARS Coronavirus 2 POSITIVE (A) NEGATIVE Final  Comment: (NOTE) SARS-CoV-2 target nucleic acids are DETECTED.  The SARS-CoV-2 RNA is generally detectable in upper and lower respiratory specimens during the acute phase of infection. Positive results are indicative of the presence of SARS-CoV-2 RNA. Clinical correlation with patient history and other diagnostic information is  necessary to determine patient infection status. Positive results do not rule out bacterial infection or co-infection with other viruses.  The expected result is Negative.  Fact Sheet for Patients: HairSlick.no  Fact Sheet for Healthcare Providers: quierodirigir.com  This test is not yet approved or cleared by the [place] FDA and  has been authorized for detection and/or diagnosis of SARS-CoV-2 by FDA under an Emergency Use Authorization (EUA). This EUA will remain  in effect (meaning this test can be used) for the duration of the COVID-19 declaration under Section 564(b)(1) of the Act, 21 U. S.C. section 360bbb-3(b)(1), unless the authorization is terminated or revoked sooner.   Performed at [facility], [address] [place], [place] [address]   SARS CORONAVIRUS 2 (TAT 6-24 HRS) Nasopharyngeal Nasopharyngeal Swab     Status: Abnormal   Collection Time: 11/18/20  4:40 PM   Specimen: Nasopharyngeal Swab  Result Value Ref Range Status   SARS Coronavirus 2 POSITIVE (A) NEGATIVE Final    Comment: (NOTE) SARS-CoV-2 target nucleic acids are DETECTED.  The SARS-CoV-2 RNA is generally detectable in upper and lower respiratory specimens during the acute phase of infection. Positive results are indicative of the presence of SARS-CoV-2 RNA. Clinical correlation with patient history and other diagnostic information is  necessary to  determine patient infection status. Positive results do not rule out bacterial infection or co-infection with other viruses.  The expected result is Negative.  Fact Sheet for Patients: HairSlick.no  Fact Sheet for Healthcare Providers: quierodirigir.com  This test is not yet approved or cleared by the [place] FDA and  has been authorized for detection and/or diagnosis of SARS-CoV-2 by FDA under an Emergency Use Authorization (EUA). This EUA will remain  in effect (meaning this test can be used) for the duration of the COVID-19 declaration under Section 564(b)(1) of the Act, 21 U. S.C. section 360bbb-3(b)(1), unless the authorization is terminated or revoked sooner.   Performed at [facility], [address], [place], [place] [address]           Radiology Studies: US RENAL  Result Date: 11/20/2020 CLINICAL DATA:  Acute kidney injury. EXAM: RENAL / URINARY TRACT ULTRASOUND COMPLETE COMPARISON:  None. FINDINGS: Right Kidney: Renal measurements: 11.1 x 5.9 x 5.1 cm = volume: 173 mL. Mild hydronephrosis is noted. Echogenicity within normal limits. No mass visualized. Left Kidney: Renal measurements: 9.5 x 5.0 x 4.7 cm = volume: 116 mL. Mild hydronephrosis is noted. Echogenicity within normal limits. No mass visualized. Bladder: Urinary bladder appears to be moderately distended. Other: None. IMPRESSION: Moderately distended urinary bladder. Mild bilateral hydronephrosis. Electronically Signed   By: Lupita Raider M.D.   On: 11/20/2020 21:13   DG CHEST PORT 1 VIEW  Result Date: 11/19/2020 CLINICAL DATA:  Postoperative follow-up.  History of COVID positive. EXAM: PORTABLE CHEST 1 VIEW COMPARISON:  November 14, 2020 FINDINGS: The endotracheal tube and right internal jugular venous catheter seen on the prior study has been removed. A nasogastric tube is noted with its distal end extending into the body of the stomach. Mild  linear atelectasis is seen along the medial aspect of the left lung base. There is no evidence of a pleural  effusion or pneumothorax. Radiopaque surgical clips are seen overlying the superior mediastinum. The cardiac silhouette is mildly enlarged and unchanged in size. The visualized skeletal structures are unremarkable. IMPRESSION: Stable cardiomegaly with mild residual left basilar linear atelectasis. Electronically Signed   By: Aram Candela M.D.   On: 11/19/2020 19:34   DG Swallowing Func-Speech Pathology  Result Date: 11/20/2020 Formatting of this result is different from the original. Objective Swallowing Evaluation: Type of Study: MBS-Modified Barium Swallow Study  Patient Details Name: Jesse [name] MRN: [id number] Date of Birth: [birth year] Today's Date: 11/20/2020 Time: SLP Start Time (ACUTE ONLY): 1440 -SLP Stop Time (ACUTE ONLY): 1456 SLP Time Calculation (min) (ACUTE ONLY): 16 min Past Medical History: Past Medical History: Diagnosis Date  Anxiety   Depression   Essential hypertension   S/P aortic dissection repair 11/05/2020  Straight graft replacement of ascending aorta and proximal transverse aortic arch with re-suspension of native aortic valve and open hemi arch distal anastomosis with aorta to right common carotid bypass and aorta to right subclavian bypass Past Surgical History: Past Surgical History: Procedure Laterality Date  ASCENDING AORTIC ROOT REPLACEMENT N/A 11/04/2020  Procedure: 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 WITH REPLACEMENT OF ASCENDING AORTA AND HEMIARCH USING HEMASHIELD PLATINUM GRAFT AND HEMASHIELD GOLD 14 X GRAFT, RESUSPENSION OF NATIVE VALVE, AORTA TO RIGHT CAROTID BYPASS, AORTA TO RIGHT SUBCLAVIAN BYPASS;  Surgeon: Purcell Nails, MD;  Location: [facility] OR;  Service: Open Heart Surgery;  Laterality: N/A;  TEE WITHOUT CARDIOVERSION N/A 11/04/2020  Procedure: TRANSESOPHAGEAL ECHOCARDIOGRAM (TEE);  Surgeon: Purcell Nails, MD;  Location: [facility] OR;  Service: Open  Heart Surgery;  Laterality: N/A; HPI: 45 year old man with PMHx significant for anxiety, depression, tobacco and EtOH use initially admitted to [facility] 7/12 for chest pain radiating to his neck. Symptoms worsened to include acute receptive/expressive aphasia, unilateral blurry vision in R eye and AMS. Code Stroke called, initial CT Head negative; however, CTA revealed acute dissection involving the aortic arch, through the innominate artery and R common carotid artery.     He was taken emergently to the OR 7/12 for repair. Intubated for procedure, extubated same day. Given aphasia at the time of presentation, Neuro was consulted postoperatively for possible CVA post-dissection. 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 The following day (7/15), patient had increasing O2 requirements and was reintubated (extubated to HFNC 7/16). Postoperative course was additionally complicated by coagulopathy, L antecubital superficial venous thrombus and persistent hyperactive delirium (requiring Precedex). He was transferred to [facility] 7/21.     7/22 patient was found to be hypotensive and tachycardic.,  CODE BLUE was called patient was reintubated and was started on vasopressors and admitted by [facility].  Subjective: "That was awesome" (in response to tsp of thin) Assessment / Plan / Recommendation CHL IP CLINICAL IMPRESSIONS 11/20/2020 Clinical Impression Pt presented with oropharyngeal dysphagia characterized by reduced bolus awareness, prolonged mastication, reduced bolus cohesion, and an intermittent pharyngeal delay. Mastication was prolonged with dysphagia 2 solids and all regular texture boluses were spat out or removed by the pt without any attempt at mastication. Pt demonstrated premature spillage to the valleculae and pyriform sinuses which, with the pharyngeal delay, resulted in two instances of penetration (PAS 2, 3) with thin liquids across 14 swallows of thin liquids via cup and straw. Both of these  instances were noted when pt was using consecutive swallows. A wet vocal quality was noted throughout the study, but this did not align with any instances  of laryngeal invasion. A dysphagia 1 diet with thin liquids will be initiated at this time. SLP will follow to ensure diet tolerance and for advancement as clinically indicated. SLP Visit Diagnosis Dysphagia, oropharyngeal phase (R13.12) Attention and concentration deficit following -- Frontal lobe and executive function deficit following -- Impact on safety and function Risk for inadequate nutrition/hydration;Moderate aspiration risk   CHL IP TREATMENT RECOMMENDATION 11/20/2020 Treatment Recommendations Therapy as outlined in treatment plan below   Prognosis 11/20/2020 Prognosis for Safe Diet Advancement Good Barriers to Reach Goals Cognitive deficits Barriers/Prognosis Comment -- CHL IP DIET RECOMMENDATION 11/20/2020 SLP Diet Recommendations Dysphagia 1 (Puree) solids;Thin liquid Liquid Administration via Cup;Straw Medication Administration Crushed with puree Compensations Minimize environmental distractions;Small sips/bites;Slow rate Postural Changes Seated upright at 90 degrees   CHL IP OTHER RECOMMENDATIONS 11/20/2020 Recommended Consults -- Oral Care Recommendations Oral care BID Other Recommendations --   CHL IP FOLLOW UP RECOMMENDATIONS 11/20/2020 Follow up [facility]ations [facility] D[facility]/2020 Speech Therapy Frequency (ACUTE ONLY) min 2x/week Treatment Duration 2 weeks      CHL IP ORAL PHASE 11/20/2020 Oral Phase Impaired Oral - Pudding Teaspoon -- Oral - Pudding Cup -- Oral - Honey Teaspoon -- Oral - Honey Cup -- Oral - Nectar Teaspoon -- Oral - Nectar Cup -- Oral - Nectar Straw Premature spillage;Decreased bolus cohesion Oral - Thin Teaspoon -- Oral - Thin Cup Premature spillage;Decreased bolus cohesion Oral - Thin Straw Premature spillage;Decreased bolus cohesion Oral - Puree Decreased bolus cohesion;Premature  spillage Oral - Mech Soft Impaired mastication;Holding of bolus Oral - Regular Impaired mastication Oral - Multi-Consistency -- Oral - Pill -- Oral Phase - Comment --  CHL IP PHARYNGEAL PHASE 11/20/2020 Pharyngeal Phase Impaired Pharyngeal- Pudding Teaspoon -- Pharyngeal -- Pharyngeal- Pudding Cup -- Pharyngeal -- Pharyngeal- Honey Teaspoon -- Pharyngeal -- Pharyngeal- Honey Cup -- Pharyngeal -- Pharyngeal- Nectar Teaspoon -- Pharyngeal -- Pharyngeal- Nectar Cup -- Pharyngeal -- Pharyngeal- Nectar Straw Delayed swallow initiation-vallecula Pharyngeal -- Pharyngeal- Thin Teaspoon -- Pharyngeal -- Pharyngeal- Thin Cup Delayed swallow initiation-vallecula;Delayed swallow initiation-pyriform sinuses Pharyngeal -- Pharyngeal- Thin Straw Delayed swallow initiation-vallecula;Delayed swallow initiation-pyriform sinuses Pharyngeal -- Pharyngeal- Puree -- Pharyngeal -- Pharyngeal- Mechanical Soft -- Pharyngeal -- Pharyngeal- Regular -- Pharyngeal -- Pharyngeal- Multi-consistency -- Pharyngeal -- Pharyngeal- Pill -- Pharyngeal -- Pharyngeal Comment --  CHL IP CERVICAL ESOPHAGEAL PHASE 11/20/2020 Cervical Esophageal Phase WFL Pudding Teaspoon -- Pudding Cup -- Honey Teaspoon -- Honey Cup -- Nectar Teaspoon -- Nectar Cup -- Nectar Straw -- Thin Teaspoon -- Thin Cup -- Thin Straw -- Puree -- Mechanical Soft -- Regular -- Multi-consistency -- Pill -- Cervical Esophageal Comment -- Shanika I. Vear ClockPhillips, MS, CCC-SLP Acute Rehabilitation Services Office number [phone] Pager [phone] Scheryl MartenShanika I Phillips 11/20/2020, 3:42 PM                   Scheduled Meds:  amLODipine  10 mg Per Tube Daily   vitamin C  500 mg Per Tube Daily   aspirin  81 mg Per Tube Daily   baricitinib  2 mg Per Tube Daily   carvedilol  3.125 mg Oral BID WC   Chlorhexidine Gluconate Cloth  6 each Topical Daily   dexamethasone (DECADRON) injection  6 mg Intravenous Q24H   enoxaparin (LOVENOX) injection  40 mg Subcutaneous Q12H   feeding supplement  (PROSource TF)  45 mL Per Tube TID   hydrALAZINE  50 mg Per Tube Q8H   insulin aspart  0-15 Units Subcutaneous Q4H  mouth rinse  15 mL Mouth Rinse BID   pantoprazole sodium  40 mg Per Tube Daily   QUEtiapine  50 mg Per Tube QHS   zinc sulfate  220 mg Per Tube Daily   Continuous Infusions:  sodium chloride Stopped (11/16/20 1115)     LOS: 7 days        Kathlen Mody, MD [facility]   To contact the attending provider between 7A-7P or the covering provider during after hours 7P-7A, please log into the web site [email or web address] and access using universal [place] password for that web site. If you do not have the password, please call the hospital operator.  11/21/2020, 4:58 PM

## 2020-11-22 ENCOUNTER — Inpatient Hospital Stay: Payer: Self-pay

## 2020-11-22 LAB — BASIC METABOLIC PANEL
Anion gap: 9 (ref 5–15)
BUN: 53 mg/dL — ABNORMAL HIGH (ref 6–20)
CO2: 23 mmol/L (ref 22–32)
Calcium: 8.7 mg/dL — ABNORMAL LOW (ref 8.9–10.3)
Chloride: 107 mmol/L (ref 98–111)
Creatinine, Ser: 1.54 mg/dL — ABNORMAL HIGH (ref 0.61–1.24)
GFR, Estimated: 56 mL/min — ABNORMAL LOW (ref >60)
Glucose, Bld: 80 mg/dL (ref 70–99)
Potassium: 4.1 mmol/L (ref 3.5–5.1)
Sodium: 139 mmol/L (ref 135–145)

## 2020-11-22 LAB — GLUCOSE, CAPILLARY
Glucose-Capillary: 98 mg/dL (ref 70–99)
Glucose-Capillary: 189 mg/dL — ABNORMAL HIGH (ref 70–99)
Glucose-Capillary: 69 mg/dL — ABNORMAL LOW (ref 70–99)
Glucose-Capillary: 71 mg/dL (ref 70–99)

## 2020-11-22 MED ORDER — SODIUM CHLORIDE 0.9% FLUSH
10.0000 mL | Freq: Two times a day (BID) | INTRAVENOUS | Status: DC
Start: 2020-11-22 — End: 2020-11-27
  Administered 2020-11-22 – 2020-11-23 (×2): 20 mL
  Administered 2020-11-23 – 2020-11-27 (×7): 10 mL

## 2020-11-22 MED ORDER — SODIUM CHLORIDE 0.9% FLUSH: 10.0000 mL | INTRAVENOUS | Status: DC | PRN

## 2020-11-22 MED ORDER — SODIUM CHLORIDE 0.9 % IV SOLN: INTRAVENOUS | Status: DC

## 2020-11-22 NOTE — Progress Notes (Signed)
PROGRESS NOTE    Jesse [name]  SWF:[id number] DOB: Dec 01, 1975 DOA: 11/14/2020 PCP: Pcp, No    No chief complaint on file.   Brief Narrative: 44 year old man with PMHx significant for anxiety, depression, tobacco and EtOH use initially admitted to [facility] 7/12 for chest pain radiating to his neck. Symptoms worsened to include acute receptive/expressive aphasia, unilateral blurry vision in R eye and AMS. Code Stroke called, initial CT Head negative; however, CTA revealed acute dissection involving the aortic arch, through the innominate artery and R common carotid artery.   He was taken emergently to the OR 7/12 for repair. Intubated for procedure, extubated same day. Given aphasia at the time of presentation, Neuro was consulted postoperatively for possible CVA post-dissection. 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 The following day (7/15), patient had increasing O2 requirements and was reintubated (extubated to HFNC 7/16). Postoperative course was additionally complicated by coagulopathy, L antecubital superficial venous thrombus and persistent hyperactive delirium (requiring Precedex). He was transferred to [facility] 7/21.  7/22 patient was found to be hypotensive and tachycardic.,  CODE BLUE was called patient was reintubated and was started on vasopressors and admitted by [facility].  Patient was transferred to [facility] on 11/17/2020. Pt seen and examined , overnight pt had a repeat CT head without contrast  showing expected evolution of the stroke.  Pt seen and examined at bedside.  Diet advanced to dysphagia 2 diet.   Assessment & Plan:   Principal Problem:   Acute respiratory failure with hypoxia (HCC) Active Problems:   Respiratory arrest (HCC)   Adverse effects of medication   Hypotension   Encounter for central line placement   Pressure injury of skin  Acute hypoxic respiratory failure in the setting of stroke With respiratory suppression probably secondary to  benzodiazepine administration versus COVID associated hypoxic respiratory arrest and aspiration pneumonia. Patient was intubated, extubated and currently on room air. PCCM recommended Rocephin for 7 days. Started the patient on remdesivir steroids and baricitinib. Complete the course of baricitinib and steroids.  Repeat covid test is positive.  CXR on 7/27 shows mild residual left basilar linear atelectasis.  Complete the course of antibiotics.  Pt remains on RA not in distress.   Acute right MCA/PCA territory infarct Patient had expressive and receptive aphasia on admission. CT of the head on 7/14 demonstrating right MCA PCA territory infarct   Acute aortic dissection CTA on 11/04/2020 shows arctic dissection involving aortic arch, innominate artery and right common carotid artery S/p repair with TCTS on 11/04/2020  Acute encephalopathy secondary to stroke, respiratory failure Patient has significant cognitive deficits, not following commands.  Continue with valproic acid.  Avoid benzodiazepines. SLP eval recommending NPO.   Thrombocytopenia:  Resolved.   Left brachial superficial venous thrombosis Currently on Lovenox.   Mild AKI with hypernatremia . Probably from free water deficit.  Increased free water infusion to 200 ml every 4 hours.  US renal ordered, showed distended bladder and mild bilateral hydronephrosis.  Foley catheter to be placed. Improving  creatinine. Will start him on gentle hydration.    Dysphagia/deconditioning Severe protein calorie malnutrition Patient currently has cortrack feeds. SLP re consulted, advanced to dysphagia 2 diet.    Pressure injury Pressure Injury 11/14/20 Vertebral column Stage 1 -  Intact skin with non-blanchable redness of a localized area usually over a bony prominence. 3x4 area w/ scab on outside and pink center on bony prominince (Active)  11/14/20 0800  Location: Vertebral column  Location Orientation:  Staging: Stage 1 -   Intact skin with non-blanchable redness of a localized area usually over a bony prominence.  Wound Description (Comments): 3x4 area w/ scab on outside and pink center on bony prominince  Present on Admission: Yes  Wound care consulted.    Hypertension;  BP parameters are improving.  Currently on amlodipine, increase hydralazine to 50 mg TID, added coreg 3.125 BID.     Anemia of acute illness:  Macrocytic,  Anemia panel shows low iron levels.  B12 Levels are adequate.     DVT prophylaxis: Lovenox Code Status: Full code  Family Communication: None at bedside,  Disposition:   Status is: Inpatient  Remains inpatient appropriate because:Ongoing diagnostic testing needed not appropriate for outpatient work up, Unsafe d/c plan, and IV treatments appropriate due to intensity of illness or inability to take PO  Dispo: The patient is from:  CIR              Anticipated d/c is to: SNF              Patient currently is not medically stable to d/c.   Difficult to place patient No       Consultants:  PCCM.   Procedures:   S/P aortic dissection repair on 11/05/20    Antimicrobials:  Antibiotics Given (last 72 hours)     None         Subjective: No new complaints. Pt is trying to talk.  Objective: Vitals:   11/22/20 0003 11/22/20 0300 11/22/20 0802 11/22/20 1100  BP: 126/78 (!) 157/93 (!) 134/95 101/73  Pulse: 85 90    Resp: 16 16    Temp: 98.2 F (36.8 C) 98.6 F (37 C) 98.2 F (36.8 C) 97.9 F (36.6 C)  TempSrc: Axillary Oral Oral Oral  SpO2: 98% 100%    Weight:  60.3 kg    Height:        Intake/Output Summary (Last 24 hours) at 11/22/2020 1428 Last data filed at 11/22/2020 1321 Gross per 24 hour  Intake 360 ml  Output 4100 ml  Net -3740 ml    Filed Weights   11/20/20 0310 11/21/20 0506 11/22/20 0300  Weight: 60.1 kg 60.3 kg 60.3 kg    Examination:  General exam:  Alert and comfortable.  Respiratory system: Air entry fair, no wheezing heard.  On RA.  Cardiovascular system: RRR no JVD, no pedal edema.  Gastrointestinal system: Abdomen is soft, NT ND BS+ Central nervous system: alert and following commands.  Extremities: no pedal edema.  Skin: stage 1 back pressure injury.   Psychiatry: restless.     Data Reviewed: I have personally reviewed following labs and imaging studies  CBC: Recent Labs  Lab 11/16/20 0532 11/17/20 0047 11/18/20 0110 11/19/20 0119 11/20/20 0130  WBC 10.0 8.4 7.5 7.3 7.3  HGB 8.2* 9.6* 9.5* 9.9* 10.8*  HCT 25.8* 29.4* 29.0* 31.3* 34.9*  MCV 103.6* 100.3* 99.7 103.0* 105.4*  PLT 91* 116* 200 251 321     Basic Metabolic Panel: Recent Labs  Lab 11/16/20 0532 11/17/20 0047 11/18/20 0110 11/19/20 0119 11/20/20 0130  NA 138 138 142 146* 146*  K 4.0 4.3 3.9 3.9 3.9  CL 103 105 108 111 112*  CO2 28 22 23 25 25   GLUCOSE 102* 91 109* 99 112*  BUN 36* 41* 53* 63* 74*  CREATININE 1.07 1.05 1.28* 1.52* 1.62*  CALCIUM 8.2* 8.6* 8.7* 8.9 8.9  MG 2.1 2.3 2.5* 2.5* 2.6*  PHOS 2.5 3.3 4.6  4.5 4.8*     GFR: Estimated Creatinine Clearance: 48.6 mL/min (A) (by C-G formula based on SCr of 1.62 mg/dL (H)).  Liver Function Tests: Recent Labs  Lab 11/20/20 0130  AST 62*  ALT 203*  ALKPHOS 113  BILITOT 0.5  PROT 6.4*  ALBUMIN 2.9*     CBG: Recent Labs  Lab 11/21/20 1545 11/21/20 2005 11/21/20 2309 11/22/20 0514 11/22/20 1205  GLUCAP 80 78 113* 98 189*      Recent Results (from the past 240 hour(s))  Culture, blood (routine x 2)     Status: None   Collection Time: 11/14/20  8:50 AM   Specimen: BLOOD  Result Value Ref Range Status   Specimen Description BLOOD SITE NOT SPECIFIED  Final   Special Requests   Final    BOTTLES DRAWN AEROBIC AND ANAEROBIC Blood Culture results may not be optimal due to an excessive volume of blood received in culture bottles   Culture   Final    NO GROWTH 5 DAYS Performed at [facility], [address] [place], [place] [address]    Report  Status 11/19/2020 FINAL  Final  Culture, blood (routine x 2)     Status: None   Collection Time: 11/14/20  9:00 AM   Specimen: BLOOD  Result Value Ref Range Status   Specimen Description BLOOD RIGHT ANTECUBITAL  Final   Special Requests   Final    BOTTLES DRAWN AEROBIC ONLY Blood Culture adequate volume   Culture   Final    NO GROWTH 5 DAYS Performed at [facility], [address] [place], [place] [address]    Report Status 11/19/2020 FINAL  Final  Urine Culture     Status: None   Collection Time: 11/14/20  9:40 AM   Specimen: Urine, Catheterized  Result Value Ref Range Status   Specimen Description URINE, CATHETERIZED  Final   Special Requests NONE  Final   Culture   Final    NO GROWTH Performed at [facility], [address], [place], [place] [address]    Report Status 11/15/2020 FINAL  Final  MRSA Next Gen by PCR, Nasal     Status: None   Collection Time: 11/14/20 12:29 PM   Specimen: Nasal Mucosa; Nasal Swab  Result Value Ref Range Status   MRSA by PCR Next Gen NOT DETECTED NOT DETECTED Final    Comment: (NOTE) The GeneXpert MRSA Assay (FDA approved for NASAL specimens only), is one component of a comprehensive MRSA colonization surveillance program. It is not intended to diagnose MRSA infection nor to guide or monitor treatment for MRSA infections. Test performance is not FDA approved in patients less than 69 years old. Performed at [facility], [address], [place], [place] [address]   SARS CORONAVIRUS 2 (TAT 6-24 HRS) Nasopharyngeal Nasopharyngeal Swab     Status: Abnormal   Collection Time: 11/15/20 12:57 AM   Specimen: Nasopharyngeal Swab  Result Value Ref Range Status   SARS Coronavirus 2 POSITIVE (A) NEGATIVE Final    Comment: (NOTE) SARS-CoV-2 target nucleic acids are DETECTED.  The SARS-CoV-2 RNA is generally detectable in upper and lower respiratory specimens during the acute phase of infection. Positive results are indicative of  the presence of SARS-CoV-2 RNA. Clinical correlation with patient history and other diagnostic information is  necessary to determine patient infection status. Positive results do not rule out bacterial infection or co-infection with other viruses.  The expected result is Negative.  Fact Sheet for Patients: HairSlick.no  Fact Sheet for Healthcare Providers: quierodirigir.com  This test is not yet approved or cleared by the [place] FDA and  has been authorized for detection and/or diagnosis of SARS-CoV-2 by FDA under an Emergency Use Authorization (EUA). This EUA will remain  in effect (meaning this test can be used) for the duration of the COVID-19 declaration under Section 564(b)(1) of the Act, 21 U. S.C. section 360bbb-3(b)(1), unless the authorization is terminated or revoked sooner.   Performed at [facility], [address], [place], [place] [address]   SARS CORONAVIRUS 2 (TAT 6-24 HRS) Nasopharyngeal Nasopharyngeal Swab     Status: Abnormal   Collection Time: 11/18/20  4:40 PM   Specimen: Nasopharyngeal Swab  Result Value Ref Range Status   SARS Coronavirus 2 POSITIVE (A) NEGATIVE Final    Comment: (NOTE) SARS-CoV-2 target nucleic acids are DETECTED.  The SARS-CoV-2 RNA is generally detectable in upper and lower respiratory specimens during the acute phase of infection. Positive results are indicative of the presence of SARS-CoV-2 RNA. Clinical correlation with patient history and other diagnostic information is  necessary to determine patient infection status. Positive results do not rule out bacterial infection or co-infection with other viruses.  The expected result is Negative.  Fact Sheet for Patients: HairSlick.no  Fact Sheet for Healthcare Providers: quierodirigir.com  This test is not yet approved or cleared by the [place] FDA and   has been authorized for detection and/or diagnosis of SARS-CoV-2 by FDA under an Emergency Use Authorization (EUA). This EUA will remain  in effect (meaning this test can be used) for the duration of the COVID-19 declaration under Section 564(b)(1) of the Act, 21 U. S.C. section 360bbb-3(b)(1), unless the authorization is terminated or revoked sooner.   Performed at [facility], [address], [place], [place] [address]           Radiology Studies: US RENAL  Result Date: 11/20/2020 CLINICAL DATA:  Acute kidney injury. EXAM: RENAL / URINARY TRACT ULTRASOUND COMPLETE COMPARISON:  None. FINDINGS: Right Kidney: Renal measurements: 11.1 x 5.9 x 5.1 cm = volume: 173 mL. Mild hydronephrosis is noted. Echogenicity within normal limits. No mass visualized. Left Kidney: Renal measurements: 9.5 x 5.0 x 4.7 cm = volume: 116 mL. Mild hydronephrosis is noted. Echogenicity within normal limits. No mass visualized. Bladder: Urinary bladder appears to be moderately distended. Other: None. IMPRESSION: Moderately distended urinary bladder. Mild bilateral hydronephrosis. Electronically Signed   By: Lupita Raider M.D.   On: 11/20/2020 21:13   DG Swallowing Func-Speech Pathology  Result Date: 11/20/2020 Formatting of this result is different from the original. Objective Swallowing Evaluation: Type of Study: MBS-Modified Barium Swallow Study  Patient Details Name: Lavern Maslow MRN: [id number] Date of Birth: [birth year] Today's Date: 11/20/2020 Time: SLP Start Time (ACUTE ONLY): 1440 -SLP Stop Time (ACUTE ONLY): 1456 SLP Time Calculation (min) (ACUTE ONLY): 16 min Past Medical History: Past Medical History: Diagnosis Date  Anxiety   Depression   Essential hypertension   S/P aortic dissection repair 11/05/2020  Straight graft replacement of ascending aorta and proximal transverse aortic arch with re-suspension of native aortic valve and open hemi arch distal anastomosis with aorta to right common carotid  bypass and aorta to right subclavian bypass Past Surgical History: Past Surgical History: Procedure Laterality Date  ASCENDING AORTIC ROOT REPLACEMENT N/A 11/04/2020  Procedure: 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 WITH REPLACEMENT OF ASCENDING AORTA AND HEMIARCH USING HEMASHIELD  PLATINUM 26MM GRAFT AND HEMASHIELD GOLD 14 X 8MM GRAFT, RESUSPENSION OF NATIVE VALVE, AORTA TO RIGHT CAROTID BYPASS, AORTA TO RIGHT SUBCLAVIAN BYPASS;  Surgeon: Purcell Nailswen, Clarence H, MD;  Location: [facility] OR;  Service: Open Heart Surgery;  Laterality: N/A;  TEE WITHOUT CARDIOVERSION N/A 11/04/2020  Procedure: TRANSESOPHAGEAL ECHOCARDIOGRAM (TEE);  Surgeon: Purcell Nailswen, Clarence H, MD;  [facility];  Service: Open Heart Surgery;  Laterality: N/A; HPI: 45 year old man with PMHx significant for anxiety, depression, tobacco and EtOH use [facility]12 for chest pain radiating to his neck. Symptoms worsened to include acute receptive/expressive aphasia, unilateral blurry vision in R eye and AMS. Code Stroke called, initial CT Head negative; however, CTA revealed acute dissection involving the aortic arch, through the innominate artery and R common carotid artery.     He was taken emergently to the OR 7/12 for repair. Intubated for procedure, extubated same day. Given aphasia at the time of presentation, Neuro was consulted postoperatively for possible CVA post-dissection. 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 The following day (7/15), patient had increasing O2 requirements and was reintubated (extubated to HFNC 7/16). Postoperative course was additionally complicated by coagulopathy, L antecubital superficial venous thrombus and persistent hyperactive delirium (requiring Precedex). [facility]     7/22 patient was found to be hypotensive and tachycardic.,  CODE BLUE was called patient was reintubated and was started on vasopressors [facility]  Subjective: "That was awesome" (in  response to tsp of thin) Assessment / Plan / Recommendation CHL IP CLINICAL IMPRESSIONS 11/20/2020 Clinical Impression Pt presented with oropharyngeal dysphagia characterized by reduced bolus awareness, prolonged mastication, reduced bolus cohesion, and an intermittent pharyngeal delay. Mastication was prolonged with dysphagia 2 solids and all regular texture boluses were spat out or removed by the pt without any attempt at mastication. Pt demonstrated premature spillage to the valleculae and pyriform sinuses which, with the pharyngeal delay, resulted in two instances of penetration (PAS 2, 3) with thin liquids across 14 swallows of thin liquids via cup and straw. Both of these instances were noted when pt was using consecutive swallows. A wet vocal quality was noted throughout the study, but this did not align with any instances of laryngeal invasion. A dysphagia 1 diet with thin liquids will be initiated at this time. SLP will follow to ensure diet tolerance and for advancement as clinically indicated. SLP Visit Diagnosis Dysphagia, oropharyngeal phase (R13.12) Attention and concentration deficit following -- Frontal lobe and executive function deficit following -- Impact on safety and function Risk for inadequate nutrition/hydration;Moderate aspiration risk   CHL IP TREATMENT RECOMMENDATION 11/20/2020 Treatment Recommendations Therapy as outlined in treatment plan below   Prognosis 11/20/2020 Prognosis for Safe Diet Advancement Good Barriers to Reach Goals Cognitive deficits Barriers/Prognosis Comment -- CHL IP DIET RECOMMENDATION 11/20/2020 SLP Diet Recommendations Dysphagia 1 (Puree) solids;Thin liquid Liquid Administration via Cup;Straw Medication Administration Crushed with puree Compensations Minimize environmental distractions;Small sips/bites;Slow rate Postural Changes Seated upright at 90 degrees   CHL IP OTHER RECOMMENDATIONS 11/20/2020 Recommended Consults -- Oral Care Recommendations Oral care BID Other  Recommendations --   CHL IP FOLLOW UP RECOMMENDATIONS 11/20/2020 Follow up Recommendations [facility] D[facility] 11/20/2020 Speech Therapy Frequency (ACUTE ONLY) min 2x/week Treatment Duration 2 weeks      CHL IP ORAL PHASE 11/20/2020 Oral Phase Impaired Oral - Pudding Teaspoon -- Oral - Pudding Cup -- Oral - Honey Teaspoon -- Oral -  Honey Cup -- Oral - Nectar Teaspoon -- Oral - Nectar Cup -- Oral - Nectar Straw Premature spillage;Decreased bolus cohesion Oral - Thin Teaspoon -- Oral - Thin Cup Premature spillage;Decreased bolus cohesion Oral - Thin Straw Premature spillage;Decreased bolus cohesion Oral - Puree Decreased bolus cohesion;Premature spillage Oral - Mech Soft Impaired mastication;Holding of bolus Oral - Regular Impaired mastication Oral - Multi-Consistency -- Oral - Pill -- Oral Phase - Comment --  CHL IP PHARYNGEAL PHASE 11/20/2020 Pharyngeal Phase Impaired Pharyngeal- Pudding Teaspoon -- Pharyngeal -- Pharyngeal- Pudding Cup -- Pharyngeal -- Pharyngeal- Honey Teaspoon -- Pharyngeal -- Pharyngeal- Honey Cup -- Pharyngeal -- Pharyngeal- Nectar Teaspoon -- Pharyngeal -- Pharyngeal- Nectar Cup -- Pharyngeal -- Pharyngeal- Nectar Straw Delayed swallow initiation-vallecula Pharyngeal -- Pharyngeal- Thin Teaspoon -- Pharyngeal -- Pharyngeal- Thin Cup Delayed swallow initiation-vallecula;Delayed swallow initiation-pyriform sinuses Pharyngeal -- Pharyngeal- Thin Straw Delayed swallow initiation-vallecula;Delayed swallow initiation-pyriform sinuses Pharyngeal -- Pharyngeal- Puree -- Pharyngeal -- Pharyngeal- Mechanical Soft -- Pharyngeal -- Pharyngeal- Regular -- Pharyngeal -- Pharyngeal- Multi-consistency -- Pharyngeal -- Pharyngeal- Pill -- Pharyngeal -- Pharyngeal Comment --  CHL IP CERVICAL ESOPHAGEAL PHASE 11/20/2020 Cervical Esophageal Phase WFL Pudding Teaspoon -- Pudding Cup -- Honey Teaspoon -- Honey Cup -- Nectar Teaspoon -- Nectar Cup -- Nectar Straw -- Thin Teaspoon --  Thin Cup -- Thin Straw -- Puree -- Mechanical Soft -- Regular -- Multi-consistency -- Pill -- Cervical Esophageal Comment -- Shanika I. Vear Clock, MS, CCC-SLP Acute Rehabilitation Services Office number [phone] Pager [phone] Scheryl Marten 11/20/2020, 3:42 PM              [place] EKG SITE RITE  Result Date: 11/22/2020 If [facility] image not attached, placement could not be confirmed due to current cardiac rhythm.       Scheduled Meds:  amLODipine  10 mg Oral Daily   vitamin C  500 mg Oral Daily   aspirin  81 mg Oral Daily   baricitinib  2 mg Oral Daily   carvedilol  3.125 mg Oral BID WC   Chlorhexidine Gluconate Cloth  6 each Topical Daily   dexamethasone (DECADRON) injection  6 mg Intravenous Q24H   enoxaparin (LOVENOX) injection  40 mg Subcutaneous Q12H   feeding supplement (PROSource TF)  45 mL Per Tube TID   hydrALAZINE  50 mg Oral Q8H   insulin aspart  0-15 Units Subcutaneous Q4H   mouth rinse  15 mL Mouth Rinse BID   pantoprazole sodium  40 mg Oral Daily   QUEtiapine  50 mg Oral QHS   sodium chloride flush  10-40 mL Intracatheter Q12H   zinc sulfate  220 mg Oral Daily   Continuous Infusions:  sodium chloride Stopped (11/16/20 1115)     LOS: 8 days        Kathlen Mody, MD [facility]   To contact the attending provider between 7A-7P or the covering provider during after hours 7P-7A, please log into the web site [email or web address] and access using universal [place] password for that web site. If you do not have the password, please call the hospital operator.  11/22/2020, 2:28 PM

## 2020-11-22 NOTE — Progress Notes (Signed)
Peripherally Inserted Central Catheter Placement  The IV Nurse has discussed with the patient and/or persons authorized to consent for the patient, the purpose of this procedure and the potential benefits and risks involved with this procedure.  The benefits include less needle sticks, lab draws from the catheter, and the patient may be discharged home with the catheter. Risks include, but not limited to, infection, bleeding, blood clot (thrombus formation), and puncture of an artery; nerve damage and irregular heartbeat and possibility to perform a PICC exchange if needed/ordered by physician.  Alternatives to this procedure were also discussed.  Bard Power PICC patient education guide, fact sheet on infection prevention and patient information card has been provided to patient /or left at bedside.  Telephone consent obtained from wife.  PICC Placement Documentation  PICC Single Lumen 11/22/20 Right Basilic 43 cm (Active)  Indication for Insertion or Continuance of Line Limited venous access - need for IV therapy >5 days (PICC only) 11/22/20 1405  Exposed Catheter (cm) 0 cm 11/22/20 1405  Site Assessment Clean;Dry;Intact 11/22/20 1405  Line Status Saline locked;Flushed;Blood return noted 11/22/20 1405  Dressing Type Transparent 11/22/20 1405  Dressing Status Clean;Dry;Intact 11/22/20 1405  Antimicrobial disc in place? Yes 11/22/20 1405  Safety Lock Not Applicable 11/22/20 1405  Line Care Connections checked and tightened 11/22/20 1405  Line Adjustment (NICU/IV Team Only) No 11/22/20 1405  Dressing Intervention New dressing 11/22/20 1405  Dressing Change Due 11/29/20 11/22/20 1405       Elliot Dally 11/22/2020, 2:06 PM

## 2020-11-23 LAB — GLUCOSE, CAPILLARY
Glucose-Capillary: 104 mg/dL — ABNORMAL HIGH (ref 70–99)
Glucose-Capillary: 118 mg/dL — ABNORMAL HIGH (ref 70–99)
Glucose-Capillary: 81 mg/dL (ref 70–99)
Glucose-Capillary: 86 mg/dL (ref 70–99)
Glucose-Capillary: 89 mg/dL (ref 70–99)
Glucose-Capillary: 96 mg/dL (ref 70–99)

## 2020-11-23 LAB — COMPREHENSIVE METABOLIC PANEL WITH GFR
ALT: 78 U/L — ABNORMAL HIGH (ref 0–44)
AST: 32 U/L (ref 15–41)
Albumin: 2.9 g/dL — ABNORMAL LOW (ref 3.5–5.0)
Alkaline Phosphatase: 107 U/L (ref 38–126)
Anion gap: 8 (ref 5–15)
BUN: 44 mg/dL — ABNORMAL HIGH (ref 6–20)
CO2: 21 mmol/L — ABNORMAL LOW (ref 22–32)
Calcium: 8.5 mg/dL — ABNORMAL LOW (ref 8.9–10.3)
Chloride: 106 mmol/L (ref 98–111)
Creatinine, Ser: 1.4 mg/dL — ABNORMAL HIGH (ref 0.61–1.24)
GFR, Estimated: >60 mL/min
Glucose, Bld: 133 mg/dL — ABNORMAL HIGH (ref 70–99)
Potassium: 4.5 mmol/L (ref 3.5–5.1)
Sodium: 135 mmol/L (ref 135–145)
Total Bilirubin: 1 mg/dL (ref 0.3–1.2)
Total Protein: 6.2 g/dL — ABNORMAL LOW (ref 6.5–8.1)

## 2020-11-23 MED ORDER — HALOPERIDOL LACTATE 5 MG/ML IJ SOLN
2.0000 mg | Freq: Four times a day (QID) | INTRAMUSCULAR | Status: DC | PRN
  Administered 2020-11-23: 2 mg via INTRAVENOUS
  Filled 2020-11-23: qty 1

## 2020-11-23 MED ORDER — LORAZEPAM 2 MG/ML IJ SOLN
0.5000 mg | Freq: Four times a day (QID) | INTRAMUSCULAR | Status: DC | PRN
  Administered 2020-11-23: 1 mg via INTRAVENOUS
  Filled 2020-11-23 (×2): qty 1

## 2020-11-23 NOTE — Progress Notes (Signed)
Pt became very agitated this afternoon, pulling telemetry off, pulling at PICC, and foley. Pt was cussing and climbing out of bed. Wife was called to bedside and pt still remained agitated. Administered haldol which was not effective. MD notified and ordered ativan. Pt is now calm, alert, and resting in bed, watching TV, with wife in room.  Brooke Pace, RN

## 2020-11-23 NOTE — Progress Notes (Signed)
Pt curled up in bed in fetal position, does not want be bothered. When asked to take his night time, pt started cussing and pushed me away. Didn't want to co-operate for vitals or to reapply tele monitor. BP taken in his leg, might not be very accurate due to his position. Prn hydralazine administered as he refused to take anything by mouth. Wife by bedside. Will attempt to give his medication later at night. Pt sleepy and clam when not bothered. Will continue to monitor.

## 2020-11-23 NOTE — Progress Notes (Signed)
Pt was very anxious and agitated at shift change. Refused to wear heart monitor, refused to take vitals. upset and cussing the staff. Pt calmed down after his wife was in the room. Started being co-operative and was able to administer his pm meds. Wife is staying over night. Pt resting in bed, calm. Will continue to monitor.

## 2020-11-23 NOTE — Progress Notes (Signed)
SLP Cancellation Note  Patient Details Name: Jesse [name] MRN: [id number] DOB: January 17, 1976   Cancelled treatment:       Reason Eval/Treat Not Completed: Other (comment) (patient continues to be too agitated to be seen) . Angela Nevin, MA, CCC-SLP Speech Therapy

## 2020-11-23 NOTE — Plan of Care (Signed)

## 2020-11-23 NOTE — Progress Notes (Addendum)
PROGRESS NOTE    Jesse [name]  ZOX:[id number] DOB: 1976/02/24 DOA: 11/14/2020 PCP: Pcp, No    No chief complaint on file.   Brief Narrative: 45 year old man with PMHx significant for anxiety, depression, tobacco and EtOH use initially admitted to [facility] 7/12 for chest pain radiating to his neck. Symptoms worsened to include acute receptive/expressive aphasia, unilateral blurry vision in R eye and AMS. Code Stroke called, initial CT Head negative; however, CTA revealed acute dissection involving the aortic arch, through the innominate artery and R common carotid artery.   He was taken emergently to the OR 7/12 for repair. Intubated for procedure, extubated same day. Given aphasia at the time of presentation, Neuro was consulted postoperatively for possible CVA post-dissection. 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 The following day (7/15), patient had increasing O2 requirements and was reintubated (extubated to HFNC 7/16). Postoperative course was additionally complicated by coagulopathy, L antecubital superficial venous thrombus and persistent hyperactive delirium (requiring Precedex). He was transferred to [facility] 7/21.  7/22 patient was found to be hypotensive and tachycardic.,  CODE BLUE was called patient was reintubated and was started on vasopressors and admitted by [facility].  Patient was transferred to [facility] on 11/17/2020. Pt seen and examined , overnight pt had a repeat CT head without contrast  showing expected evolution of the stroke.  Pt seen and examined at bedside.  Patient is slightly agitated but currently is calm Diet advanced to dysphagia 2 diet.   Assessment & Plan:   Principal Problem:   Acute respiratory failure with hypoxia (HCC) Active Problems:   Respiratory arrest (HCC)   Adverse effects of medication   Hypotension   Encounter for central line placement   Pressure injury of skin  Acute hypoxic respiratory failure in the setting of stroke With  respiratory suppression probably secondary to benzodiazepine administration versus COVID associated hypoxic respiratory arrest and aspiration pneumonia. Patient was intubated, extubated and currently on room air. PCCM recommended Rocephin for 7 days. Started the patient on remdesivir steroids and baricitinib. Complete the course of baricitinib and steroids.  Repeat covid test is positive.  CXR on 7/27 shows mild residual left basilar linear atelectasis.  Complete the course of antibiotics.  Pt remains on RA not in distress.   Acute right MCA/PCA territory infarct Patient had expressive and receptive aphasia on admission. CT of the head on 7/14 demonstrating right MCA PCA territory infarct   Acute aortic dissection CTA on 11/04/2020 shows arctic dissection involving aortic arch, innominate artery and right common carotid artery S/p repair with TCTS on 11/04/2020  Acute encephalopathy secondary to stroke, respiratory failure Patient has significant cognitive deficits, able to follow simple commands. Continue with valproic acid.  Avoid benzodiazepines. SLP eval recommending NPO.   Thrombocytopenia:  Resolved.   Left brachial superficial venous thrombosis Currently on Lovenox.   Mild AKI with hypernatremia . Probably from free water deficit.  Increased free water infusion to 200 ml every 4 hours.  US renal ordered, showed distended bladder and mild bilateral hydronephrosis.  Foley catheter placed, improving  creatinine.  Discussed with Dr.Wrenn over the phone recommend to continue with Foley catheter at this time and if creatinine improves, possibly a voiding trial in the future Will start him on gentle hydration.    Dysphagia/deconditioning Severe protein calorie malnutrition Patient currently has cortrack feeds. SLP re consulted, advanced to dysphagia 2 diet.    Pressure injury Pressure Injury 11/14/20 Vertebral column Stage 1 -  Intact skin with non-blanchable  redness of a  localized area usually over a bony prominence. 3x4 area w/ scab on outside and pink center on bony prominince (Active)  11/14/20 0800  Location: Vertebral column  Location Orientation:   Staging: Stage 1 -  Intact skin with non-blanchable redness of a localized area usually over a bony prominence.  Wound Description (Comments): 3x4 area w/ scab on outside and pink center on bony prominince  Present on Admission: Yes  Wound care consulted.    Hypertension;  Blood pressure parameters are optimal at this time. Currently on amlodipine, increase hydralazine to 50 mg TID, added coreg 3.125 BID.     Anemia of acute illness:  Macrocytic,  Anemia panel shows low iron levels.  B12 Levels are adequate.  Will start on iron supplementation on discharge    Elevated liver enzymes Unclear etiology, improving at this time.  Patient denies any nausea.   In view of poor access a PICC line was placed for blood draws and medications.  DVT prophylaxis: Lovenox Code Status: Full code  Family Communication: None at bedside,  Disposition:   Status is: Inpatient  Remains inpatient appropriate because:Ongoing diagnostic testing needed not appropriate for outpatient work up, Unsafe d/c plan, and IV treatments appropriate due to intensity of illness or inability to take PO  Dispo: The patient is from:  CIR              Anticipated d/c is to: SNF              Patient currently is not medically stable to d/c.   Difficult to place patient No       Consultants:  PCCM.   Procedures:   S/P aortic dissection repair on 11/05/20    Antimicrobials:  Antibiotics Given (last 72 hours)     None         Subjective: Patient still with significant cognitive deficits follows some commands, appears to be comfortable  Objective: Vitals:   11/23/20 0639 11/23/20 0859 11/23/20 1016 11/23/20 1159  BP: (!) 157/102  (!) 142/101 (!) 126/96  Pulse: 73 75 96 84  Resp: 15   16  Temp: 98.2 F (36.8  C) 98.5 F (36.9 C)  97.6 F (36.4 C)  TempSrc: Oral Oral  Oral  SpO2: 94% 99%  96%  Weight:      Height:        Intake/Output Summary (Last 24 hours) at 11/23/2020 1222 Last data filed at 11/23/2020 1100 Gross per 24 hour  Intake 1053.99 ml  Output 2625 ml  Net -1571.01 ml    Filed Weights   11/21/20 0506 11/22/20 0300 11/23/20 0432  Weight: 60.3 kg 60.3 kg 60.7 kg    Examination:  General exam: Alert and comfortable not in any kind of distress Respiratory system: Clear to auscultation bilaterally no wheezing or rhonchi on room air Cardiovascular system: Regular rate rhythm, no JVD no pedal edema Gastrointestinal system: Abdomen is soft nontender nondistended bowel sounds normal Central nervous system: Alert and is able to follow only simple commands. Extremities: No pedal edema or cyanosis Skin: stage 1 back pressure injury.   Psychiatry: Patient appears restless at this time.    Data Reviewed: I have personally reviewed following labs and imaging studies  CBC: Recent Labs  Lab 11/17/20 0047 11/18/20 0110 11/19/20 0119 11/20/20 0130  WBC 8.4 7.5 7.3 7.3  HGB 9.6* 9.5* 9.9* 10.8*  HCT 29.4* 29.0* 31.3* 34.9*  MCV 100.3* 99.7 103.0* 105.4*  PLT 116* 200 251  321     Basic Metabolic Panel: Recent Labs  Lab 11/17/20 0047 11/18/20 0110 11/19/20 0119 11/20/20 0130 11/22/20 1644 11/23/20 0923  NA 138 142 146* 146* 139 135  K 4.3 3.9 3.9 3.9 4.1 4.5  CL 105 108 111 112* 107 106  CO2 22 23 25 25 23  21*  GLUCOSE 91 109* 99 112* 80 133*  BUN 41* 53* 63* 74* 53* 44*  CREATININE 1.05 1.28* 1.52* 1.62* 1.54* 1.40*  CALCIUM 8.6* 8.7* 8.9 8.9 8.7* 8.5*  MG 2.3 2.5* 2.5* 2.6*  --   --   PHOS 3.3 4.6 4.5 4.8*  --   --      GFR: Estimated Creatinine Clearance: 56.6 mL/min (A) (by C-G formula based on SCr of 1.4 mg/dL (H)).  Liver Function Tests: Recent Labs  Lab 11/20/20 0130 11/23/20 0923  AST 62* 32  ALT 203* 78*  ALKPHOS 113 107  BILITOT 0.5 1.0   PROT 6.4* 6.2*  ALBUMIN 2.9* 2.9*     CBG: Recent Labs  Lab 11/22/20 2006 11/23/20 0041 11/23/20 0426 11/23/20 0854 11/23/20 1136  GLUCAP 71 81 89 96 86      Recent Results (from the past 240 hour(s))  Culture, blood (routine x 2)     Status: None   Collection Time: 11/14/20  8:50 AM   Specimen: BLOOD  Result Value Ref Range Status   Specimen Description BLOOD SITE NOT SPECIFIED  Final   Special Requests   Final    BOTTLES DRAWN AEROBIC AND ANAEROBIC Blood Culture results may not be optimal due to an excessive volume of blood received in culture bottles   Culture   Final    NO GROWTH 5 DAYS Performed at [facility], [address] [place], [place] [address]    Report Status 11/19/2020 FINAL  Final  Culture, blood (routine x 2)     Status: None   Collection Time: 11/14/20  9:00 AM   Specimen: BLOOD  Result Value Ref Range Status   Specimen Description BLOOD RIGHT ANTECUBITAL  Final   Special Requests   Final    BOTTLES DRAWN AEROBIC ONLY Blood Culture adequate volume   Culture   Final    NO GROWTH 5 DAYS Performed at [facility], [address] [place], [place] [address]    Report Status 11/19/2020 FINAL  Final  Urine Culture     Status: None   Collection Time: 11/14/20  9:40 AM   Specimen: Urine, Catheterized  Result Value Ref Range Status   Specimen Description URINE, CATHETERIZED  Final   Special Requests NONE  Final   Culture   Final    NO GROWTH Performed at [facility], [address] [place], [place] [address]    Report Status 11/15/2020 FINAL  Final  MRSA Next Gen by PCR, Nasal     Status: None   Collection Time: 11/14/20 12:29 PM   Specimen: Nasal Mucosa; Nasal Swab  Result Value Ref Range Status   MRSA by PCR Next Gen NOT DETECTED NOT DETECTED Final    Comment: (NOTE) The GeneXpert MRSA Assay (FDA approved for NASAL specimens only), is one component of a comprehensive MRSA colonization surveillance program. It is not  intended to diagnose MRSA infection nor to guide or monitor treatment for MRSA infections. Test performance is not FDA approved in patients less than 47 years old. Performed at [facility], [address] [place], [place] [address]  SARS CORONAVIRUS 2 (TAT 6-24 HRS) Nasopharyngeal Nasopharyngeal Swab     Status: Abnormal   Collection Time: 11/15/20 12:57 AM   Specimen: Nasopharyngeal Swab  Result Value Ref Range Status   SARS Coronavirus 2 POSITIVE (A) NEGATIVE Final    Comment: (NOTE) SARS-CoV-2 target nucleic acids are DETECTED.  The SARS-CoV-2 RNA is generally detectable in upper and lower respiratory specimens during the acute phase of infection. Positive results are indicative of the presence of SARS-CoV-2 RNA. Clinical correlation with patient history and other diagnostic information is  necessary to determine patient infection status. Positive results do not rule out bacterial infection or co-infection with other viruses.  The expected result is Negative.  Fact Sheet for Patients: HairSlick.no  Fact Sheet for Healthcare Providers: quierodirigir.com  This test is not yet approved or cleared by the [place] FDA and  has been authorized for detection and/or diagnosis of SARS-CoV-2 by FDA under an Emergency Use Authorization (EUA). This EUA will remain  in effect (meaning this test can be used) for the duration of the COVID-19 declaration under Section 564(b)(1) of the Act, 21 U. S.C. section 360bbb-3(b)(1), unless the authorization is terminated or revoked sooner.   Performed at [facility], [address] [place], [place] [address]   SARS CORONAVIRUS 2 (TAT 6-24 HRS) Nasopharyngeal Nasopharyngeal Swab     Status: Abnormal   Collection Time: 11/18/20  4:40 PM   Specimen: Nasopharyngeal Swab  Result Value Ref Range Status   SARS Coronavirus 2 POSITIVE (A) NEGATIVE Final    Comment:  (NOTE) SARS-CoV-2 target nucleic acids are DETECTED.  The SARS-CoV-2 RNA is generally detectable in upper and lower respiratory specimens during the acute phase of infection. Positive results are indicative of the presence of SARS-CoV-2 RNA. Clinical correlation with patient history and other diagnostic information is  necessary to determine patient infection status. Positive results do not rule out bacterial infection or co-infection with other viruses.  The expected result is Negative.  Fact Sheet for Patients: HairSlick.no  Fact Sheet for Healthcare Providers: quierodirigir.com  This test is not yet approved or cleared by the [place] FDA and  has been authorized for detection and/or diagnosis of SARS-CoV-2 by FDA under an Emergency Use Authorization (EUA). This EUA will remain  in effect (meaning this test can be used) for the duration of the COVID-19 declaration under Section 564(b)(1) of the Act, 21 U. S.C. section 360bbb-3(b)(1), unless the authorization is terminated or revoked sooner.   Performed at [facility], [address] [place], [place] [address]           Radiology Studies: [place] EKG SITE RITE  Result Date: 11/22/2020 If [facility] image not attached, placement could not be confirmed due to current cardiac rhythm.       Scheduled Meds:  amLODipine  10 mg Oral Daily   vitamin C  500 mg Oral Daily   aspirin  81 mg Oral Daily   baricitinib  2 mg Oral Daily   carvedilol  3.125 mg Oral BID WC   Chlorhexidine Gluconate Cloth  6 each Topical Daily   dexamethasone (DECADRON) injection  6 mg Intravenous Q24H   enoxaparin (LOVENOX) injection  40 mg Subcutaneous Q12H   feeding supplement (PROSource TF)  45 mL Per Tube TID   hydrALAZINE  50 mg Oral Q8H   insulin aspart  0-15 Units Subcutaneous Q4H   mouth rinse  15 mL Mouth Rinse BID   pantoprazole sodium  40  mg Oral Daily   QUEtiapine  50 mg  Oral QHS   sodium chloride flush  10-40 mL Intracatheter Q12H   zinc sulfate  220 mg Oral Daily   Continuous Infusions:  sodium chloride Stopped (11/16/20 1115)   sodium chloride 75 mL/hr at 11/22/20 2204     LOS: 9 days        Kathlen Mody, MD [facility]   To contact the attending provider between 7A-7P or the covering provider during after hours 7P-7A, please log into the web site [email or web address] and access using universal [place] password for that web site. If you do not have the password, please call the hospital operator.  11/23/2020, 12:22 PM

## 2020-11-24 ENCOUNTER — Inpatient Hospital Stay (HOSPITAL_COMMUNITY): Payer: Medicaid Other

## 2020-11-24 LAB — CBC WITH DIFFERENTIAL/PLATELET
Abs Immature Granulocytes: 0.13 10*3/uL — ABNORMAL HIGH (ref 0.00–0.07)
Basophils Absolute: 0 10*3/uL (ref 0.0–0.1)
Basophils Relative: 0 %
Eosinophils Absolute: 0.1 10*3/uL (ref 0.0–0.5)
Eosinophils Relative: 1 %
HCT: 31.7 % — ABNORMAL LOW (ref 39.0–52.0)
Hemoglobin: 10.4 g/dL — ABNORMAL LOW (ref 13.0–17.0)
Immature Granulocytes: 2 %
Lymphocytes Relative: 20 %
Lymphs Abs: 1.2 10*3/uL (ref 0.7–4.0)
MCH: 32.6 pg (ref 26.0–34.0)
MCHC: 32.8 g/dL (ref 30.0–36.0)
MCV: 99.4 fL (ref 80.0–100.0)
Monocytes Absolute: 0.6 10*3/uL (ref 0.1–1.0)
Monocytes Relative: 10 %
Neutro Abs: 4.2 10*3/uL (ref 1.7–7.7)
Neutrophils Relative %: 67 %
Platelets: 332 10*3/uL (ref 150–400)
RBC: 3.19 MIL/uL — ABNORMAL LOW (ref 4.22–5.81)
RDW: 13.2 % (ref 11.5–15.5)
WBC: 6.3 10*3/uL (ref 4.0–10.5)
nRBC: 0.3 % — ABNORMAL HIGH (ref 0.0–0.2)

## 2020-11-24 LAB — BASIC METABOLIC PANEL
Anion gap: 9 (ref 5–15)
BUN: 31 mg/dL — ABNORMAL HIGH (ref 6–20)
CO2: 21 mmol/L — ABNORMAL LOW (ref 22–32)
Calcium: 8.2 mg/dL — ABNORMAL LOW (ref 8.9–10.3)
Chloride: 105 mmol/L (ref 98–111)
Creatinine, Ser: 1.13 mg/dL (ref 0.61–1.24)
GFR, Estimated: >60 mL/min (ref >60)
Glucose, Bld: 79 mg/dL (ref 70–99)
Potassium: 4.1 mmol/L (ref 3.5–5.1)
Sodium: 135 mmol/L (ref 135–145)

## 2020-11-24 LAB — GLUCOSE, CAPILLARY
Glucose-Capillary: 105 mg/dL — ABNORMAL HIGH (ref 70–99)
Glucose-Capillary: 121 mg/dL — ABNORMAL HIGH (ref 70–99)
Glucose-Capillary: 128 mg/dL — ABNORMAL HIGH (ref 70–99)
Glucose-Capillary: 138 mg/dL — ABNORMAL HIGH (ref 70–99)
Glucose-Capillary: 80 mg/dL (ref 70–99)
Glucose-Capillary: 83 mg/dL (ref 70–99)

## 2020-11-24 MED ORDER — OSMOLITE 1.5 CAL PO LIQD
1200.0000 mL | ORAL | Status: DC
  Administered 2020-11-24 – 2020-11-26 (×3): 1200 mL
  Filled 2020-11-24 (×5): qty 2000

## 2020-11-24 MED ORDER — PROSOURCE TF PO LIQD
45.0000 mL | Freq: Four times a day (QID) | ORAL | Status: DC
  Administered 2020-11-24 – 2020-11-27 (×12): 45 mL
  Filled 2020-11-24 (×15): qty 45

## 2020-11-24 NOTE — Progress Notes (Signed)
Nutrition Follow-up  DOCUMENTATION CODES:   Severe malnutrition in context of chronic illness, Underweight  INTERVENTION:   Transition to nocturnal feedings: -Osmolite 1.5 @ 75 ml/hr via Cortrak x 16 hours (1700-0900) -ProSource TF 45 ml QID  Provides: 1960 kcals, 119 grams protein, 914  ml free water. Meets 93% kcal needs and 100% of protein needs.    NUTRITION DIAGNOSIS:   Severe Malnutrition related to chronic illness (anxiety, HTN, ETOH use) as evidenced by severe muscle depletion, severe fat depletion.  Ongoing  GOAL:   Patient will meet greater than or equal to 90% of their needs  Addressed via TF  MONITOR:   TF tolerance, Labs, Skin, Vent status  REASON FOR ASSESSMENT:   Ventilator    ASSESSMENT:   45 yo male with recent hospitalization 7/12-7/21 for R MCA stroke. Admitted to CIR on 7/21. Transferred from CIR to ICU on 7/22 S/P respiratory arrest requiring intubation. PMH includes anxiety, depression, HTN, ETOH use, tobacco use.  7/12 Admitted for R MCA Stroke 7/16 Cortrak placed-gastric 7/21 Admitted to CIR 7/22 Intubated 7/23 Extubated, COVID+ 7/28 Diet advanced to D1 with thin liquids  Patient's diet was advanced to dysphagia 2 on 7/29, had 1-2 meals that he was able to consume >50% of his tray per report. The decision was made to pull the Cortrak. Mental status has worsened over the last few days, and intake has declined significantly. Gastric Cortrak was replaced at bedside. Plan to start nocturnal feeding to allow for PO during the day. Do not recommend pulling Cortrak until patient shows consistency in PO intake as mental status has remained a large barrier.   Admission weight: 68.8 kg  Current weight: 60.7 kg   UOP: 1400 ml x 24 hrs   Medications: 500 mg Vitamin C, SS novolog, 220 mg zinc sulfate  Labs: CBG 80-118  Diet Order:   Diet Order             DIET DYS 2 Room service appropriate? No; Fluid consistency: Thin  Diet effective now                    EDUCATION NEEDS:   Not appropriate for education at this time  Skin:  Skin Assessment: Skin Integrity Issues: Skin Integrity Issues:: Stage I Stage I: vertebral column Incisions: chest, groin  Last BM:  7/31  Height:   Ht Readings from Last 1 Encounters:  11/20/20 6\' 2"  (1.88 m)    Weight:   Wt Readings from Last 1 Encounters:  11/24/20 60.7 kg     BMI:  Body mass index is 17.18 kg/m.  Estimated Nutritional Needs:   Kcal:  2100-2300  Protein:  115-130 gm  Fluid:  >/= 2 L  Brittany Amirault MS, RD, LDN, CNSC Clinical Nutrition Pager listed in AMION

## 2020-11-24 NOTE — Progress Notes (Signed)
  Speech Language Pathology Treatment: Dysphagia;Cognitive-Linquistic  Patient Details Name: Jesse [name] MRN: [id number] DOB: 1975/05/18 Today's Date: 11/24/2020 Time: 9935-7017 SLP Time Calculation (min) (ACUTE ONLY): 19 min  Assessment / Plan / Recommendation Clinical Impression  Pt was seen for treatment. He was alert and cooperative during the session. Pt demonstrated increased difficulty with attention during this session despite cues and his responses were note more consistently unrelated to questions. Pt required verbal prompts for reasoning during a temporal orientation task. Pt demonstrated 20% accuracy with a simple reasoning task increasing to 40% with verbal prompts and part-word cues. He achieved 0% accuracy with immediate recall of 2 items despite prompts and cues. He completed verbally-presented phrases with 40% accuracy given repetition and intermittent phonemic cues. Pt tolerated puree solids, dysphagia 2 solids and thin liquids via straw without overt s/sx of aspiration. Mastication and oral clearance were adequate. Vocal quality was more hoarse than when he was last seen by SLP. A mildly wet vocal quality was noted at the onset of the session, but this was eliminated with pt's independent use of throat clearing. Pt's dysphagia 2 diet and thin liquids will be continued. SLP will continue to follow pt.    HPI HPI: Pt is a 45 y.o. male admitted 11/04/20 with acute onset aphasia, blurry vision. CTA revealed acute aortic dissection. Pt with worsening O2 requirements requiring intubation 7/15, self extubated 7/16. 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 CTH on 7/20 for unequal pupils revealed interval development of petechial hemorrhage and mild progression of edema and mass effect with 84mm leftward shift. He was [facility] to [facility] 7/21 and 7/22 patient was found to be hypotensive and  tachycardic. Code blue was called patient was reintubated. ETT 7/22-7/23. Cortrak placed 7/16 and then removed after p.o. intake was started. Cortrak placed again 8/1 due to poor p.o. intake. PMH: Anxiety, Depression, Essential hypertension.      SLP Plan  Continue with current plan of care       Recommendations  Diet recommendations: Dysphagia 2 (fine chop);Thin liquid Liquids provided via: Cup;Straw Medication Administration: Crushed with puree Supervision: Patient able to self feed;Full supervision/cueing for compensatory strategies Compensations: Minimize environmental distractions;Small sips/bites;Slow rate;Follow solids with liquid (check for pocketing) Postural Changes and/or Swallow Maneuvers: Seated upright 90 degrees                Oral Care Recommendations: Oral care BID Follow up Recommendations: Other (comment) (TBD) SLP Visit Diagnosis: Dysphagia, oropharyngeal phase (R13.12) Plan: Continue with current plan of care       Joshua Soulier I. Vear Clock, MS, CCC-SLP Acute Rehabilitation Services Office number [phone] Pager [phone]                Scheryl Marten 11/24/2020, 5:09 PM

## 2020-11-24 NOTE — Progress Notes (Signed)
Physical Therapy Treatment Patient Details Name: Jesse [name] MRN: [id number] DOB: 29-Dec-1975 Today's Date: 11/24/2020    History of Present Illness Pt is a 45 y.o. male re-admitted 11/14/20 from CIR with acute respiratory failure requiring intubation 7/22-7/23, hypotension, tacycardic.  Pt found to be COVID (+) 7/23.  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 Pt. transferred to [facility] 7/21-7/22.  PMH: anxiety, depression, essential hypertension.    PT Comments    Pt had SLP just prior to PT and was self limiting in his mobility (returned to bed even though I think he could have walked further and participated in LE exercises and balance training).  He did better with the RW use today, however, still needs cues to hold onto it without letting go during gait and may do well with continued trials of gait without an assistive device.  If he remains hospitalized until he is out of quarantine he may be a good candidate to return to CIR.  If he his medically stable before then he will need a COVID SNF.  PT will continue to follow acutely for safe mobility progression.  Follow Up Recommendations  SNF (may consider CIR if he is here long enough to be outside of his COVID quarantine.)     Equipment Recommendations  Rolling walker with 5" wheels;Wheelchair (measurements PT);Wheelchair cushion (measurements PT)    Recommendations for Other Services       Precautions / Restrictions Precautions Precautions: Fall;Sternal Precaution Comments: cortrak    Mobility  Bed Mobility Overal bed mobility: Needs Assistance Bed Mobility: Supine to Sit;Sit to Supine Rolling: Mod assist     Sit to supine: Min assist   General bed mobility comments: Mod assist to come to sitting EOB as pt has difficulty processing verbal commands and needed PT to initiate movement.    Transfers Overall transfer level: Needs assistance Equipment  used: Rolling walker (2 wheeled) Transfers: Sit to/from Stand Sit to Stand: Min assist         General transfer comment: Min assist to stand to RW, RW adjusted up for tall stature.  Pt needed cues and manual assist to maintain bil hands on RW during standing and gait. wanted to walk before therapist was ready.  Ambulation/Gait Ambulation/Gait assistance: Min assist Gait Distance (Feet): 60 Feet Assistive device: Rolling walker (2 wheeled) Gait Pattern/deviations: Step-through pattern;Decreased stride length;Staggering left;Staggering right     General Gait Details: Pt did better than previous session with RW today, however, continues to take hands off, so may be better to continue efforts at gait wihout AD (hand held assist unilateral or bil).   Stairs             Wheelchair Mobility    Modified Rankin (Stroke Patients Only) Modified Rankin (Stroke Patients Only) Pre-Morbid Rankin Score: No symptoms Modified Rankin: Moderately severe disability     Balance Overall balance assessment: Needs assistance Sitting-balance support: Feet supported;No upper extremity supported Sitting balance-Leahy Scale: Fair     Standing balance support: Bilateral upper extremity supported;No upper extremity supported;Single extremity supported Standing balance-Leahy Scale: Poor Standing balance comment: needs external support in standing.                            Cognition Arousal/Alertness: Awake/alert Behavior During Therapy: WFL for tasks assessed/performed Overall Cognitive Status: Impaired/Different from baseline Area of Impairment: Attention;Following commands;Safety/judgement;Problem solving  Current Attention Level: Sustained   Following Commands: Follows one step commands inconsistently Safety/Judgement: Decreased awareness of safety;Decreased awareness of deficits Awareness: Intellectual Problem Solving: Difficulty sequencing General  Comments: Pt with significant difficulty of sequencing.  Does best with manual initiation of movement vs understanding of verbal (even basic) cues.      Exercises      General Comments General comments (skin integrity, edema, etc.): Pt could have walked further, but decided he had enough and got back into bed, no further participation in my session, he did have SLP just prior to me and was unable to tell me what he did with SLP (global aphasia).      Pertinent Vitals/Pain Pain Assessment: Faces Faces Pain Scale: No hurt    Home Living                      Prior Function            PT Goals (current goals can now be found in the care plan section) Acute Rehab PT Goals Patient Stated Goal: unable to state Progress towards PT goals: Progressing toward goals    Frequency    Min 3X/week      PT Plan Current plan remains appropriate    Co-evaluation              AM-PAC PT "6 Clicks" Mobility   Outcome Measure  Help needed turning from your back to your side while in a flat bed without using bedrails?: A Little Help needed moving from lying on your back to sitting on the side of a flat bed without using bedrails?: A Lot Help needed moving to and from a bed to a chair (including a wheelchair)?: A Little Help needed standing up from a chair using your arms (e.g., wheelchair or bedside chair)?: A Little Help needed to walk in hospital room?: A Little Help needed climbing 3-5 steps with a railing? : Total 6 Click Score: 15    End of Session Equipment Utilized During Treatment: Gait belt Activity Tolerance: Patient tolerated treatment well Patient left: in bed;with bed alarm set;with call bell/phone within reach;with restraints reapplied   PT Visit Diagnosis: Other symptoms and signs involving the nervous system (R29.898);Other abnormalities of gait and mobility (R26.89) Hemiplegia - Right/Left: Left Hemiplegia - dominant/non-dominant:  Non-dominant Hemiplegia - caused by: Cerebral infarction     Time: 2878-6767 PT Time Calculation (min) (ACUTE ONLY): 20 min  Charges:  $Gait Training: 8-22 mins                    Corinna Capra, PT, DPT  Acute Rehabilitation Ortho Tech Supervisor [phone] pager [phone]) [phone] office

## 2020-11-24 NOTE — Procedures (Signed)
Cortrak  Person Inserting Tube:  Nailea Whitehorn C, RD Tube Type:  Cortrak - 43 inches Tube Size:  10 Tube Location:  Left nare Initial Placement:  Stomach Secured by: Bridle Technique Used to Measure Tube Placement:  Marking at nare/corner of mouth Cortrak Secured At:  74 cm  Cortrak Tube Team Note:  Consult received to place a Cortrak feeding tube.   X-ray is required, abdominal x-ray has been ordered by the Cortrak team. Please confirm tube placement before using the Cortrak tube.   If the tube becomes dislodged please keep the tube and contact the Cortrak team at [email or web address] (password [email or web address]) for replacement.  If after hours and replacement cannot be delayed, place a NG tube and confirm placement with an abdominal x-ray.    Hannah Crill P., RD, LDN, CNSC See AMiON for contact information     

## 2020-11-24 NOTE — Progress Notes (Signed)
PROGRESS NOTE    Jesse [name]  PTW:[id number] DOB: [birth year] DOA: 11/14/2020 PCP: Pcp, No    No chief complaint on file.   Brief Narrative: 45 year old man with PMHx significant for anxiety, depression, tobacco and EtOH use initially admitted to [facility] 7/12 for chest pain radiating to his neck. Symptoms worsened to include acute receptive/expressive aphasia, unilateral blurry vision in R eye and AMS. Code Stroke called, initial CT Head negative; however, CTA revealed acute dissection involving the aortic arch, through the innominate artery and R common carotid artery.   He was taken emergently to the OR 7/12 for repair. Intubated for procedure, extubated same day. Given aphasia at the time of presentation, Neuro was consulted postoperatively for possible CVA post-dissection. 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 The following day (7/15), patient had increasing O2 requirements and was reintubated (extubated to HFNC 7/16). Postoperative course was additionally complicated by coagulopathy, L antecubital superficial venous thrombus and persistent hyperactive delirium (requiring Precedex). He was transferred to [facility] 7/21.  7/22 patient was found to be hypotensive and tachycardic.,  CODE BLUE was called patient was reintubated and was started on vasopressors and admitted by [facility].  Patient was transferred to [facility] on 11/17/2020. Pt seen and examined , overnight pt had a repeat CT head without contrast  showing expected evolution of the stroke.  Pt seen and examined at bedside, he is agitated currents in restraints.  Diet advanced to dysphagia 2 diet.   Assessment & Plan:   Principal Problem:   Acute respiratory failure with hypoxia (HCC) Active Problems:   Respiratory arrest (HCC)   Adverse effects of medication   Hypotension   Encounter for central line placement   Pressure injury of skin  Acute hypoxic respiratory failure in the setting of stroke With respiratory  suppression probably secondary to benzodiazepine administration versus COVID associated hypoxic respiratory arrest and aspiration pneumonia. Patient was intubated, extubated and currently on room air. PCCM recommended Rocephin for 7 days. Started the patient on remdesivir steroids and baricitinib. Complete the course of baricitinib and steroids.  Repeat covid test is positive.  CXR on 7/27 shows mild residual left basilar linear atelectasis.  Complete the course of antibiotics.  Pt remains on RA not in distress.   Acute right MCA/PCA territory infarct Patient had expressive and receptive aphasia on admission. CT of the head on 7/14 demonstrating right MCA PCA territory infarct   Acute aortic dissection CTA on 11/04/2020 shows arctic dissection involving aortic arch, innominate artery and right common carotid artery S/p repair with TCTS on 11/04/2020  Acute encephalopathy secondary to stroke, respiratory failure Patient has significant cognitive deficits, able to follow simple commands only.  Continue with valproic acid.  Avoid benzodiazepines. SLP eva recommending dysphagia 2 diet.   Thrombocytopenia:  Resolved.   Left brachial superficial venous thrombosis Currently on Lovenox.   Mild AKI with hypernatremia . Probably from free water deficit.  Increased free water infusion to 200 ml every 4 hours.  US renal ordered, showed distended bladder and mild bilateral hydronephrosis.  Foley catheter placed, improving  creatinine.  Discussed with Dr.Wrenn over the phone recommend to continue with Foley catheter at this time and if creatinine improves, possibly a voiding trial in the future Renal parameters are back to baseline with IV lfuids and foley catheter.    Dysphagia/deconditioning Severe protein calorie malnutrition Started him on back on tube feeds as he has not eaten any in the last 48 hours.  SLP re consulted, advanced  to dysphagia 2 diet.    Pressure injury Pressure  Injury 11/14/20 Vertebral column Stage 1 -  Intact skin with non-blanchable redness of a localized area usually over a bony prominence. 3x4 area w/ scab on outside and pink center on bony prominince (Active)  11/14/20 0800  Location: Vertebral column  Location Orientation:   Staging: Stage 1 -  Intact skin with non-blanchable redness of a localized area usually over a bony prominence.  Wound Description (Comments): 3x4 area w/ scab on outside and pink center on bony prominince  Present on Admission: Yes  Wound care consulted.    Hypertension;  Blood pressure parameters are well controlled.  Currently on amlodipine, increase hydralazine to 50 mg TID, added coreg 3.125 BID.     Anemia of acute illness:  Macrocytic,  Anemia panel shows low iron levels.  B12 Levels are adequate.  Will start on iron supplementation on discharge.    Elevated liver enzymes Unclear etiology, improving at this time.  Patient denies any nausea or vomiting.    In view of poor access a PICC line was placed for blood draws and medications.  DVT prophylaxis: Lovenox Code Status: Full code  Family Communication: None at bedside,  Disposition:   Status is: Inpatient  Remains inpatient appropriate because:Ongoing diagnostic testing needed not appropriate for outpatient work up, Unsafe d/c plan, and IV treatments appropriate due to intensity of illness or inability to take PO  Dispo: The patient is from:  CIR              Anticipated d/c is to: SNF              Patient currently is not medically stable to d/c.   Difficult to place patient Yes       Consultants:  PCCM.   Procedures:   S/P aortic dissection repair on 11/05/20    Antimicrobials:  Antibiotics Given (last 72 hours)     None         Subjective: Pt has cognitive deficits.  Was able to finishe 85% of breakfast as per RN/ tech at bedside.  He needed help feeding, unable to hold a fork or spoon .   Objective: Vitals:    11/24/20 0516 11/24/20 0535 11/24/20 0745 11/24/20 1230  BP: 129/75 129/75 (!) 136/105 (!) 135/94  Pulse: 87  89 84  Resp: 13  15 12   Temp: 97.8 F (36.6 C)  98.2 F (36.8 C) 98.5 F (36.9 C)  TempSrc: Oral  Oral Oral  SpO2: 96%  100% 100%  Weight: 60.7 kg     Height:        Intake/Output Summary (Last 24 hours) at 11/24/2020 1244 Last data filed at 11/24/2020 0930 Gross per 24 hour  Intake 1348.71 ml  Output 700 ml  Net 648.71 ml    Filed Weights   11/22/20 0300 11/23/20 0432 11/24/20 0516  Weight: 60.3 kg 60.7 kg 60.7 kg    Examination:  General exam: Alert , comfortable, calmed down a little.  Respiratory system: air entry fair, no wheezing heard, no tachypnea on RA.  Cardiovascular system: RRR no JVD, no pedal edema.  Gastrointestinal system: Abdomen is soft non tender bowel sounds wnl.  Central nervous system: Alert, cognitive deficits, able to move all extremities.  Extremities: No pedal edema.  Skin: stage 1 back pressure injury.   Psychiatry: agitated earlier this am, but calmed down.     Data Reviewed: I have personally reviewed following labs and imaging studies  CBC: Recent Labs  Lab 11/18/20 0110 11/19/20 0119 11/20/20 0130 11/24/20 0535  WBC 7.5 7.3 7.3 6.3  NEUTROABS  --   --   --  4.2  HGB 9.5* 9.9* 10.8* 10.4*  HCT 29.0* 31.3* 34.9* 31.7*  MCV 99.7 103.0* 105.4* 99.4  PLT 200 251 321 332     Basic Metabolic Panel: Recent Labs  Lab 11/18/20 0110 11/19/20 0119 11/20/20 0130 11/22/20 1644 11/23/20 0923 11/24/20 0535  NA 142 146* 146* 139 135 135  K 3.9 3.9 3.9 4.1 4.5 4.1  CL 108 111 112* 107 106 105  CO2 23 25 25 23  21* 21*  GLUCOSE 109* 99 112* 80 133* 79  BUN 53* 63* 74* 53* 44* 31*  CREATININE 1.28* 1.52* 1.62* 1.54* 1.40* 1.13  CALCIUM 8.7* 8.9 8.9 8.7* 8.5* 8.2*  MG 2.5* 2.5* 2.6*  --   --   --   PHOS 4.6 4.5 4.8*  --   --   --      GFR: Estimated Creatinine Clearance: 70.1 mL/min (by C-G formula based on SCr of 1.13  mg/dL).  Liver Function Tests: Recent Labs  Lab 11/20/20 0130 11/23/20 0923  AST 62* 32  ALT 203* 78*  ALKPHOS 113 107  BILITOT 0.5 1.0  PROT 6.4* 6.2*  ALBUMIN 2.9* 2.9*     CBG: Recent Labs  Lab 11/23/20 1628 11/23/20 2043 11/24/20 0013 11/24/20 0512 11/24/20 0759  GLUCAP 118* 104* 80 83 105*      Recent Results (from the past 240 hour(s))  SARS CORONAVIRUS 2 (TAT 6-24 HRS) Nasopharyngeal Nasopharyngeal Swab     Status: Abnormal   Collection Time: 11/15/20 12:57 AM   Specimen: Nasopharyngeal Swab  Result Value Ref Range Status   SARS Coronavirus 2 POSITIVE (A) NEGATIVE Final    Comment: (NOTE) SARS-CoV-2 target nucleic acids are DETECTED.  The SARS-CoV-2 RNA is generally detectable in upper and lower respiratory specimens during the acute phase of infection. Positive results are indicative of the presence of SARS-CoV-2 RNA. Clinical correlation with patient history and other diagnostic information is  necessary to determine patient infection status. Positive results do not rule out bacterial infection or co-infection with other viruses.  The expected result is Negative.  Fact Sheet for Patients: 11/17/20  Fact Sheet for Healthcare Providers: HairSlick.no  This test is not yet approved or cleared by the [place] FDA and  has been authorized for detection and/or diagnosis of SARS-CoV-2 by FDA under an Emergency Use Authorization (EUA). This EUA will remain  in effect (meaning this test can be used) for the duration of the COVID-19 declaration under Section 564(b)(1) of the Act, 21 U. S.C. section 360bbb-3(b)(1), unless the authorization is terminated or revoked sooner.   Performed at [facility], [address], [place], [place]   SARS CORONAVIRUS 2 (TAT 6-24 HRS) Nasopharyngeal Nasopharyngeal Swab     Status: Abnormal   Collection Time: 11/18/20  4:40 PM   Specimen:  Nasopharyngeal Swab  Result Value Ref Range Status   SARS Coronavirus 2 POSITIVE (A) NEGATIVE Final    Comment: (NOTE) SARS-CoV-2 target nucleic acids are DETECTED.  The SARS-CoV-2 RNA is generally detectable in upper and lower respiratory specimens during the acute phase of infection. Positive results are indicative of the presence of SARS-CoV-2 RNA. Clinical correlation with patient history and other diagnostic information is  necessary to determine patient infection status. Positive results do not rule out bacterial infection or co-infection with other viruses.  The expected result is Negative.  Fact Sheet for Patients: HairSlick.no  Fact Sheet for Healthcare Providers: quierodirigir.com  This test is not yet approved or cleared by the [place] FDA and  has been authorized for detection and/or diagnosis of SARS-CoV-2 by FDA under an Emergency Use Authorization (EUA). This EUA will remain  in effect (meaning this test can be used) for the duration of the COVID-19 declaration under Section 564(b)(1) of the Act, 21 U. S.C. section 360bbb-3(b)(1), unless the authorization is terminated or revoked sooner.   Performed at [facility], [address] [place], [place] [address]           Radiology Studies: No results found.      Scheduled Meds:  amLODipine  10 mg Oral Daily   vitamin C  500 mg Oral Daily   aspirin  81 mg Oral Daily   baricitinib  2 mg Oral Daily   carvedilol  3.125 mg Oral BID WC   Chlorhexidine Gluconate Cloth  6 each Topical Daily   enoxaparin (LOVENOX) injection  40 mg Subcutaneous Q12H   feeding supplement (PROSource TF)  45 mL Per Tube TID   hydrALAZINE  50 mg Oral Q8H   insulin aspart  0-15 Units Subcutaneous Q4H   mouth rinse  15 mL Mouth Rinse BID   pantoprazole sodium  40 mg Oral Daily   QUEtiapine  50 mg Oral QHS   sodium chloride flush  10-40 mL Intracatheter Q12H    zinc sulfate  220 mg Oral Daily   Continuous Infusions:     LOS: 10 days        Kathlen Mody, MD [facility]   To contact the attending provider between 7A-7P or the covering provider during after hours 7P-7A, please log into the web site [email or web address] and access using universal [place] password for that web site. If you do not have the password, please call the hospital operator.  11/24/2020, 12:44 PM

## 2020-11-24 NOTE — Plan of Care (Signed)

## 2020-11-25 LAB — GLUCOSE, CAPILLARY
Glucose-Capillary: 130 mg/dL — ABNORMAL HIGH (ref 70–99)
Glucose-Capillary: 132 mg/dL — ABNORMAL HIGH (ref 70–99)
Glucose-Capillary: 161 mg/dL — ABNORMAL HIGH (ref 70–99)
Glucose-Capillary: 96 mg/dL (ref 70–99)

## 2020-11-25 NOTE — Progress Notes (Signed)
Patient resting in bed and had 12 beats V-Tach followed by few S.R. Beats then 6 beats V-Tach then back to S.R. with a few short runs Of V-tach and PVC's then stays in S.R.  Reden R.N. aware cont. To monitor patient and rhythm. Wife at bedside

## 2020-11-25 NOTE — Plan of Care (Signed)
  Problem: Clinical Measurements: Goal: Respiratory complications will improve Outcome: Progressing   Problem: Activity: Goal: Risk for activity intolerance will decrease Outcome: Progressing   Problem: Nutrition: Goal: Adequate nutrition will be maintained Outcome: Progressing   Problem: Coping: Goal: Level of anxiety will decrease Outcome: Progressing   Problem: Pain Managment: Goal: General experience of comfort will improve Outcome: Progressing   

## 2020-11-25 NOTE — Progress Notes (Signed)
Occupational Therapy Treatment Patient Details Name: Jesse [name] MRN: [id number] DOB: 1975-06-19 Today's Date: 11/25/2020    History of present illness Pt is a 45 y.o. male re-admitted 11/14/20 from CIR with acute respiratory failure requiring intubation 7/22-7/23, hypotension, tacycardic.  Pt found to be COVID (+) 7/23.  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 Pt. transferred to [facility] 7/21-7/22.  PMH: anxiety, depression, essential hypertension.   OT comments  Pt with gradual progress towards OT goals, pleasant and participatory. Due to cognitive and communication impairments, pt benefits from tactile cues more so than verbal cues, as well as assist in initiating tasks. Pt able to mobilize to/from bathroom with RW at min guard and without AD at Min A. Pt noted to keep one UE on therapist's shoulder for support during both bouts of mobility. Pt able to demo initiation of washing hands with Supervision, but difficulty initiating washing face - requiring therapist's assist. Continue to recommend postacute rehab. If cleared from COVID precautions, would recommend CIR at this time.   Follow Up Recommendations  SNF;Supervision/Assistance - 24 hour (if cleared from COVID+ precautions, would benefit from CIR)    Equipment Recommendations  Other (comment) (TBD)    Recommendations for Other Services Rehab consult    Precautions / Restrictions Precautions Precautions: Fall;Sternal Precaution Comments: cortrak Restrictions Weight Bearing Restrictions: No       Mobility Bed Mobility Overal bed mobility: Needs Assistance Bed Mobility: Supine to Sit     Supine to sit: Min assist;HOB elevated     General bed mobility comments: Min A to initiate and move LEs to EOB due to difficulty following verbal cues    Transfers Overall transfer level: Needs assistance Equipment used: Rolling walker (2 wheeled);1 person hand  held assist Transfers: Sit to/from Stand Sit to Stand: Min guard         General transfer comment: min guard for safety, no physical assist needed to power up, noted to push RW away from in front of self    Balance Overall balance assessment: Needs assistance Sitting-balance support: Feet supported;No upper extremity supported Sitting balance-Leahy Scale: Fair     Standing balance support: Bilateral upper extremity supported;No upper extremity supported;Single extremity supported Standing balance-Leahy Scale: Poor Standing balance comment: reliant on at least one UE support with mobility, external support needed with only one UE support                           ADL either performed or assessed with clinical judgement   ADL Overall ADL's : Needs assistance/impaired     Grooming: Moderate assistance;Sitting;Wash/dry hands;Wash/dry face Grooming Details (indicate cue type and reason): able to wash hands with washcloth when given to pt though unable to follow cues to wipe face (specifically food in facial hair) so assist needed to complete this task                 Toilet Transfer: Minimal assistance;Ambulation;BSC;Regular Toilet;RW Toilet Transfer Details (indicate cue type and reason): Pt overall min guard with use of RW though had R hand on RW and L hand on therapist's shoulders. able to stand without assist from toilet [facility] over regular toilet due to tall stature). On exit of bathroom, trialed without AD with unsteadiness noted with only one hand on therapist's shoulder. Cues to avoid crossing feet and taking big steps  Functional mobility during ADLs: Minimal assistance;Rolling walker;Cueing for sequencing;Cueing for safety General ADL Comments: Consistent cues/assist needed for safety due to impulsivity though progressing mobility. Difficulty sequencing ADLs but appears to respond better to tactile cues and initiation of tasks with pt     Vision    Vision Assessment?: Vision impaired- to be further tested in functional context Additional Comments: appeared [facility], will continually assess   Perception     Praxis      Cognition Arousal/Alertness: Awake/alert Behavior During Therapy: WFL for tasks assessed/performed Overall Cognitive Status: Impaired/Different from baseline Area of Impairment: Attention;Following commands;Safety/judgement;Problem solving                   Current Attention Level: Sustained   Following Commands: Follows one step commands inconsistently;Follows one step commands with increased time Safety/Judgement: Decreased awareness of safety;Decreased awareness of deficits Awareness: Intellectual Problem Solving: Difficulty sequencing General Comments: Pt with significant difficulty of sequencing.  Does best with manual initiation of movement vs understanding of verbal (even basic) cues. Does laugh/respond appropriately to humor        Exercises     Shoulder Instructions       General Comments Cued for sternal precautions though pt with difficulty adhering to "no pushing" up with UEs. Diastolic BP slightly elevated after activity    Pertinent Vitals/ Pain       Pain Assessment: Faces Faces Pain Scale: No hurt Pain Intervention(s): Monitored during session  Home Living                                          Prior Functioning/Environment              Frequency  Min 3X/week        Progress Toward Goals  OT Goals(current goals can now be found in the care plan section)  Progress towards OT goals: Progressing toward goals  Acute Rehab OT Goals Patient Stated Goal: unable to state OT Goal Formulation: Patient unable to participate in goal setting Time For Goal Achievement: 12/01/20 Potential to Achieve Goals: Fair ADL Goals Pt Will Perform Grooming: with mod assist;sitting Pt Will Perform Upper Body Bathing: with mod assist;sitting Pt Will Perform Lower Body  Dressing: with mod assist;sit to/from stand Pt Will Transfer to Toilet: with mod assist;stand pivot transfer;bedside commode Pt/caregiver will Perform Home Exercise Program: Increased strength;Left upper extremity Additional ADL Goal #1: Pt will follow one step commands with mulitmodal cues with 25% accuracy. Additional ADL Goal #2: Pt will participate in vision assessment to determine impact on ADL and mobility.  Plan Discharge plan remains appropriate;Frequency remains appropriate    Co-evaluation                 AM-PAC OT "6 Clicks" Daily Activity     Outcome Measure   Help from another person eating meals?: A Little Help from another person taking care of personal grooming?: A Lot Help from another person toileting, which includes using toliet, bedpan, or urinal?: A Lot Help from another person bathing (including washing, rinsing, drying)?: A Lot Help from another person to put on and taking off regular upper body clothing?: A Lot Help from another person to put on and taking off regular lower body clothing?: A Lot 6 Click Score: 13    End of Session Equipment Utilized During Treatment: Gait belt;Rolling walker  OT Visit Diagnosis: Other abnormalities  of gait and mobility (R26.89);Muscle weakness (generalized) (M62.81);Cognitive communication deficit (R41.841);Other symptoms and signs involving cognitive function Symptoms and signs involving cognitive functions: Cerebral infarction   Activity Tolerance Patient tolerated treatment well   Patient Left in chair;with call bell/phone within reach;Other (comment) Radiographer, therapeutic in room, declined need for chair alarm)   Nurse Communication Mobility status        Time: 1040-1109 OT Time Calculation (min): 29 min  Charges: OT General Charges $OT Visit: 1 Visit OT Treatments $Self Care/Home Management : 8-22 mins $Therapeutic Activity: 8-22 mins  Bradd Canary, OTR/L Acute Rehab Services Office: [phone]    Lorre Munroe 11/25/2020, 12:30 PM

## 2020-11-25 NOTE — Progress Notes (Signed)
PROGRESS NOTE    Jesse [name]  TUU:[id number] DOB: [birth year] DOA: 11/14/2020 PCP: Pcp, No    No chief complaint on file.   Brief Narrative: 45 year old man with PMHx significant for anxiety, depression, tobacco and EtOH use initially admitted to [facility] 7/12 for chest pain radiating to his neck. Symptoms worsened to include acute receptive/expressive aphasia, unilateral blurry vision in R eye and AMS. Code Stroke called, initial CT Head negative; however, CTA revealed acute dissection involving the aortic arch, through the innominate artery and R common carotid artery.   He was taken emergently to the OR 7/12 for repair. Intubated for procedure, extubated same day. Given aphasia at the time of presentation, Neuro was consulted postoperatively for possible CVA post-dissection. 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 The following day (7/15), patient had increasing O2 requirements and was reintubated (extubated to [facility]NC 7/16). Postoperative course was additionally complicated by coagulopathy, L antecubital superficial venous thrombus and persistent hyperactive delirium (requiring Precedex). He was transferred to [facility] 7/21.  7/22 patient was found to be hypotensive and tachycardic.,  CODE BLUE was called patient was reintubated and was started on vasopressors and admitted by [facility].  Patient was transferred to [facility] on 11/17/2020. Follow up CT head without contrast  showing expected evolution of the stroke.  Diet advanced to dysphagia 2 diet.  Pt seen and examined at bedside. No new events overnight.   Assessment & Plan:   Principal Problem:   Acute respiratory failure with hypoxia (HCC) Active Problems:   Respiratory arrest (HCC)   Adverse effects of medication   Hypotension   Encounter for central line placement   Pressure injury of skin  Acute hypoxic respiratory failure in the setting of stroke With respiratory suppression probably secondary to benzodiazepine  administration versus COVID associated hypoxic respiratory arrest and aspiration pneumonia. Patient was intubated, extubated and currently on room air. Pt completed the course of antibiotics, remdesevir , and steroids.  Repeat covid test is positive.  CXR on 7/27 shows mild residual left basilar linear atelectasis. Pt remains on RA not in distress.   Acute right MCA/PCA territory infarct Patient had expressive and receptive aphasia on admission, continues to have cognitive deficits.  CT of the head on 7/14 demonstrating right MCA PCA territory infarct   Acute aortic dissection CTA on 11/04/2020 shows arctic dissection involving aortic arch, innominate artery and right common carotid artery S/p repair with TCTS on 11/04/2020  Acute encephalopathy secondary to stroke, respiratory failure Patient has significant cognitive deficits, able to follow simple commands only.  Continue with valproic acid.  Avoid benzodiazepines. SLP eva recommending dysphagia 2 diet.   Thrombocytopenia:  Resolved.   Left brachial superficial venous thrombosis Currently on Lovenox.   Mild AKI with hypernatremia . Probably from free water deficit.  Increased free water infusion to 200 ml every 4 hours.  US renal ordered, showed distended bladder and mild bilateral hydronephrosis.  Foley catheter placed, improving  creatinine.  Discussed with Dr.Wrenn over the phone recommend to continue with Foley catheter at this time and if creatinine improves, possibly a voiding trial in the future Renal parameters are back to baseline with IV lfuids and foley catheter.  Recommend outpatient follow up with Urology on discharge.  Recheck labs in am.    Dysphagia/deconditioning Severe protein calorie malnutrition Started him on back on tube feeds as he has not eaten any in the last 48 hours.  SLP re consulted, advanced to dysphagia 2 diet.    Pressure  injury Pressure Injury 11/14/20 Vertebral column Stage 1 -  Intact  skin with non-blanchable redness of a localized area usually over a bony prominence. 3x4 area w/ scab on outside and pink center on bony prominince (Active)  11/14/20 0800  Location: Vertebral column  Location Orientation:   Staging: Stage 1 -  Intact skin with non-blanchable redness of a localized area usually over a bony prominence.  Wound Description (Comments): 3x4 area w/ scab on outside and pink center on bony prominince  Present on Admission: Yes  Wound care consulted.    Hypertension;  BP parameters are optimal.  Currently on amlodipine, increase hydralazine to 50 mg TID, added coreg 3.125 BID.     Anemia of acute illness:  Macrocytic,  Anemia panel shows low iron levels.  B12 Levels are adequate.  Will start on iron supplementation on discharge.    Elevated liver enzymes Unclear etiology, improving at this time.  Patient denies any nausea or vomiting.    In view of poor access a PICC line was placed for blood draws and medications.  DVT prophylaxis: Lovenox Code Status: Full code  Family Communication: None at bedside,  Disposition:   Status is: Inpatient  Remains inpatient appropriate because:Ongoing diagnostic testing needed not appropriate for outpatient work up, Unsafe d/c plan, and IV treatments appropriate due to intensity of illness or inability to take PO  Dispo: The patient is from:  CIR              Anticipated d/c is to: SNF              Patient currently is not medically stable to d/c.   Difficult to place patient Yes       Consultants:  PCCM.   Procedures:   S/P aortic dissection repair on 11/05/20    Antimicrobials:  Antibiotics Given (last 72 hours)     None         Subjective: He appears calm today and no restraints  Objective: Vitals:   11/25/20 0500 11/25/20 0502 11/25/20 0518 11/25/20 0736  BP:  (!) 156/101  (!) 152/113  Pulse:  86  87  Resp:  16  15  Temp:  97.9 F (36.6 C)    TempSrc:  Oral    SpO2:  100%  100%   Weight: 61 kg  61 kg   Height:        Intake/Output Summary (Last 24 hours) at 11/25/2020 0916 Last data filed at 11/25/2020 0817 Gross per 24 hour  Intake 1140 ml  Output 3100 ml  Net -1960 ml    Filed Weights   11/24/20 0516 11/25/20 0500 11/25/20 0518  Weight: 60.7 kg 61 kg 61 kg    Examination:  General exam: alert and comfortable, no distress  Respiratory system: clear to auscultation, no wheezing heard.  Cardiovascular system: RRR no JVD, no pedal edema.  Gastrointestinal system: Abdomen is soft non tender non distended bowel sounds wnl.  Central nervous system: Alert, able to move all extremities, has significant cognitive deficits.  Extremities: No pedal edema.  Skin: stage 1 back pressure injury.   Psychiatry: no agitation today.     Data Reviewed: I have personally reviewed following labs and imaging studies  CBC: Recent Labs  Lab 11/19/20 0119 11/20/20 0130 11/24/20 0535  WBC 7.3 7.3 6.3  NEUTROABS  --   --  4.2  HGB 9.9* 10.8* 10.4*  HCT 31.3* 34.9* 31.7*  MCV 103.0* 105.4* 99.4  PLT 251 321 332  Basic Metabolic Panel: Recent Labs  Lab 11/19/20 0119 11/20/20 0130 11/22/20 1644 11/23/20 0923 11/24/20 0535  NA 146* 146* 139 135 135  K 3.9 3.9 4.1 4.5 4.1  CL 111 112* 107 106 105  CO2 25 25 23  21* 21*  GLUCOSE 99 112* 80 133* 79  BUN 63* 74* 53* 44* 31*  CREATININE 1.52* 1.62* 1.54* 1.40* 1.13  CALCIUM 8.9 8.9 8.7* 8.5* 8.2*  MG 2.5* 2.6*  --   --   --   PHOS 4.5 4.8*  --   --   --      GFR: Estimated Creatinine Clearance: 70.5 mL/min (by C-G formula based on SCr of 1.13 mg/dL).  Liver Function Tests: Recent Labs  Lab 11/20/20 0130 11/23/20 0923  AST 62* 32  ALT 203* 78*  ALKPHOS 113 107  BILITOT 0.5 1.0  PROT 6.4* 6.2*  ALBUMIN 2.9* 2.9*     CBG: Recent Labs  Lab 11/24/20 1547 11/24/20 2015 11/25/20 0009 11/25/20 0459 11/25/20 0813  GLUCAP 138* 128* 130* 132* 96      Recent Results (from the past 240 hour(s))   SARS CORONAVIRUS 2 (TAT 6-24 HRS) Nasopharyngeal Nasopharyngeal Swab     Status: Abnormal   Collection Time: 11/18/20  4:40 PM   Specimen: Nasopharyngeal Swab  Result Value Ref Range Status   SARS Coronavirus 2 POSITIVE (A) NEGATIVE Final    Comment: (NOTE) SARS-CoV-2 target nucleic acids are DETECTED.  The SARS-CoV-2 RNA is generally detectable in upper and lower respiratory specimens during the acute phase of infection. Positive results are indicative of the presence of SARS-CoV-2 RNA. Clinical correlation with patient history and other diagnostic information is  necessary to determine patient infection status. Positive results do not rule out bacterial infection or co-infection with other viruses.  The expected result is Negative.  Fact Sheet for Patients: HairSlick.nohttps://www.fda.gov/media/138098/download  Fact Sheet for Healthcare Providers: quierodirigir.comhttps://www.fda.gov/media/138095/download  This test is not yet approved or cleared by the [place] FDA and  has been authorized for detection and/or diagnosis of SARS-CoV-2 by FDA under an Emergency Use Authorization (EUA). This EUA will remain  in effect (meaning this test can be used) for the duration of the COVID-19 declaration under Section 564(b)(1) of the Act, 21 U. S.C. section 360bbb-3(b)(1), unless the authorization is terminated or revoked sooner.   Performed at [facility], [address] [place], [place] [address]           Radiology Studies: DG Abd Portable 1V  Result Date: 11/24/2020 CLINICAL DATA:  Feeding tube placement EXAM: PORTABLE ABDOMEN - 1 VIEW COMPARISON:  11/18/2020 FINDINGS: Limited radiograph of the lower chest and upper abdomen was obtained for the purposes of enteric tube localization. Enteric tube is seen coursing below the diaphragm with distal tip and side port terminating within the expected location of the distal gastric body. Visualized bowel gas pattern is nonobstructive. There is oral  contrast seen within the colon. IMPRESSION: Enteric tube within the distal gastric body. Electronically Signed   By: Duanne GuessNicholas  Plundo D.O.   On: 11/24/2020 14:21        Scheduled Meds:  amLODipine  10 mg Oral Daily   vitamin C  500 mg Oral Daily   aspirin  81 mg Oral Daily   baricitinib  2 mg Oral Daily   carvedilol  3.125 mg Oral BID WC   Chlorhexidine Gluconate Cloth  6 each Topical Daily   enoxaparin (LOVENOX) injection  40 mg Subcutaneous Q12H  feeding supplement (OSMOLITE 1.5 CAL)  1,200 mL Per Tube Q24H   feeding supplement (PROSource TF)  45 mL Per Tube QID   hydrALAZINE  50 mg Oral Q8H   insulin aspart  0-15 Units Subcutaneous Q4H   mouth rinse  15 mL Mouth Rinse BID   pantoprazole sodium  40 mg Oral Daily   QUEtiapine  50 mg Oral QHS   sodium chloride flush  10-40 mL Intracatheter Q12H   zinc sulfate  220 mg Oral Daily   Continuous Infusions:     LOS: 11 days        Kathlen Mody, MD [facility]   To contact the attending provider between 7A-7P or the covering provider during after hours 7P-7A, please log into the web site [email or web address] and access using universal [place] password for that web site. If you do not have the password, please call the hospital operator.  11/25/2020, 9:16 AM

## 2020-11-26 LAB — GLUCOSE, CAPILLARY
Glucose-Capillary: 108 mg/dL — ABNORMAL HIGH (ref 70–99)
Glucose-Capillary: 115 mg/dL — ABNORMAL HIGH (ref 70–99)
Glucose-Capillary: 117 mg/dL — ABNORMAL HIGH (ref 70–99)
Glucose-Capillary: 129 mg/dL — ABNORMAL HIGH (ref 70–99)
Glucose-Capillary: 133 mg/dL — ABNORMAL HIGH (ref 70–99)
Glucose-Capillary: 142 mg/dL — ABNORMAL HIGH (ref 70–99)
Glucose-Capillary: 147 mg/dL — ABNORMAL HIGH (ref 70–99)
Glucose-Capillary: 97 mg/dL (ref 70–99)

## 2020-11-26 LAB — CBC
HCT: 33.7 % — ABNORMAL LOW (ref 39.0–52.0)
Hemoglobin: 11.3 g/dL — ABNORMAL LOW (ref 13.0–17.0)
MCH: 32.8 pg (ref 26.0–34.0)
MCHC: 33.5 g/dL (ref 30.0–36.0)
MCV: 98 fL (ref 80.0–100.0)
Platelets: 395 10*3/uL (ref 150–400)
RBC: 3.44 MIL/uL — ABNORMAL LOW (ref 4.22–5.81)
RDW: 13.8 % (ref 11.5–15.5)
WBC: 6.7 10*3/uL (ref 4.0–10.5)
nRBC: 0 % (ref 0.0–0.2)

## 2020-11-26 LAB — BASIC METABOLIC PANEL
Anion gap: 8 (ref 5–15)
BUN: 31 mg/dL — ABNORMAL HIGH (ref 6–20)
CO2: 26 mmol/L (ref 22–32)
Calcium: 8.4 mg/dL — ABNORMAL LOW (ref 8.9–10.3)
Chloride: 101 mmol/L (ref 98–111)
Creatinine, Ser: 0.96 mg/dL (ref 0.61–1.24)
GFR, Estimated: >60 mL/min (ref >60)
Glucose, Bld: 105 mg/dL — ABNORMAL HIGH (ref 70–99)
Potassium: 3.9 mmol/L (ref 3.5–5.1)
Sodium: 135 mmol/L (ref 135–145)

## 2020-11-26 NOTE — Progress Notes (Signed)
  Speech Language Pathology Treatment: Dysphagia;Cognitive-Linquistic  Patient Details Name: Jesse [name] MRN: [id number] DOB: [birth year] Today's Date: 11/26/2020 Time: 8546-2703 SLP Time Calculation (min) (ACUTE ONLY): 19 min  Assessment / Plan / Recommendation Clinical Impression  Pt was seen for treatment with his wife present. Pt's wife reported that the pt ate 100% of lunch and she denied observance of any signs of aspiration. Pt tolerated puree solids, dysphagia 2 and dysphagia 3 solids, and thin liquids via straw using consecutive swallows without overt s/sx of aspiration. Mastication was prolonged with dysphagia 3 solids, but improved compared to that noted during prior sessions and pt's wife reported that the pt does not typically eat without maxillary dentures in place. Pt's dysphagia 2 diet and thin liquids will be continued. Pt was better able to attend during this session, but cues were still required for sustained attention. With cues, pt agreed that he was in the hospital, but he was unable to verbally provide his location. Pt demonstrated 0% accuracy with phrase completion despite prompts and cues. He responded to simple yes/no questions with 60% accuracy increasing to 80% with rephrasing and verbal prompts. SLP will continue to follow pt.    HPI HPI: Pt is a 45 y.o. male admitted 11/04/20 with acute onset aphasia, blurry vision. CTA revealed acute aortic dissection. Pt with worsening O2 requirements requiring intubation 7/15, self extubated 7/16. 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 CTH on 7/20 for unequal pupils revealed interval development of petechial hemorrhage and mild progression of edema and mass effect with 10mm leftward shift. He was [facility] to [facility] 7/21 and 7/22 patient was found to be hypotensive and tachycardic. Code blue was called patient was reintubated. ETT  7/22-7/23. Cortrak placed 7/16 and then removed after p.o. intake was started. Cortrak placed again 8/1 due to poor p.o. intake. PMH: Anxiety, Depression, Essential hypertension.      SLP Plan  Continue with current plan of care       Recommendations  Diet recommendations: Dysphagia 2 (fine chop);Thin liquid Liquids provided via: Cup;Straw Medication Administration: Crushed with puree Supervision: Patient able to self feed;Full supervision/cueing for compensatory strategies Compensations: Minimize environmental distractions;Small sips/bites;Slow rate;Follow solids with liquid (check for pocketing) Postural Changes and/or Swallow Maneuvers: Seated upright 90 degrees                Oral Care Recommendations: Oral care BID Follow up Recommendations: Other (comment) (TBD) SLP Visit Diagnosis: Dysphagia, oropharyngeal phase (R13.12) Plan: Continue with current plan of care       Chey Cho I. Vear Clock, MS, CCC-SLP Acute Rehabilitation Services Office number [phone] Pager [phone]   Scheryl Marten 11/26/2020, 5:13 PM

## 2020-11-26 NOTE — Progress Notes (Signed)
Physical Therapy Treatment Patient Details Name: Jesse [name] MRN: [id number] DOB: [birth year] Today's Date: 11/26/2020    History of Present Illness Pt is a 45 y.o. male re-admitted 11/14/20 from CIR with acute respiratory failure requiring intubation 7/22-7/23, hypotension, tacycardic.  Pt found to be COVID (+) 7/23.  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 Pt. transferred to [facility] 7/21-7/22.  PMH: anxiety, depression, essential hypertension.    PT Comments    Patient received up in recliner, wife at side. Patient is agreeable to PT session. States "whenever you are ready." Able to stand with min guard and min assist to manage lines. Patient ambulated 150 feet out in hallway with single hand held assist. Patient continues to stagger to left and right with ambulation. Will reach out for furniture/rails to assist at times. Decreased left side awareness bumping into objects on this side. He will continue to benefit from skilled PT while here to improve safety with mobility.     Follow Up Recommendations  CIR     Equipment Recommendations  Other (comment) (TBD)    Recommendations for Other Services Rehab consult     Precautions / Restrictions Precautions Precautions: Fall;Sternal Precaution Booklet Issued: No Precaution Comments: cortrak Restrictions Weight Bearing Restrictions: No RUE Weight Bearing: Weight bearing as tolerated LUE Weight Bearing: Weight bearing as tolerated    Mobility  Bed Mobility               General bed mobility comments: Patient received up in recliner with wife present in room.    Transfers Overall transfer level: Needs assistance Equipment used: None Transfers: Sit to/from Stand Sit to Stand: Min guard            Ambulation/Gait Ambulation/Gait assistance: Min Chemical engineer (Feet): 150 Feet Assistive device: 1 person hand held assist Gait  Pattern/deviations: Step-through pattern;Staggering right;Staggering left Gait velocity: slightly decreased   General Gait Details: Patient ambulated out in hallway with single hand held assist. Reaches for rail in hallway on occasion. Unsteady with ambulation.   Stairs             Wheelchair Mobility    Modified Rankin (Stroke Patients Only) Modified Rankin (Stroke Patients Only) Pre-Morbid Rankin Score: No symptoms Modified Rankin: Moderately severe disability     Balance Overall balance assessment: Needs assistance Sitting-balance support: Feet supported Sitting balance-Leahy Scale: Good     Standing balance support: Single extremity supported;During functional activity Standing balance-Leahy Scale: Poor Standing balance comment: reliant on at least one UE support with mobility, external support needed with only one UE support                            Cognition Arousal/Alertness: Awake/alert Behavior During Therapy: WFL for tasks assessed/performed Overall Cognitive Status: Difficult to assess Area of Impairment: Safety/judgement;Problem solving                   Current Attention Level: Sustained   Following Commands: Follows one step commands consistently Safety/Judgement: Decreased awareness of safety;Decreased awareness of deficits Awareness: Intellectual Problem Solving: Requires verbal cues;Requires tactile cues General Comments: patient more verbal this session than previous sessions. Continues to have some word finding difficulty.      Exercises      General Comments        Pertinent Vitals/Pain Pain Assessment: Faces Faces Pain Scale: No hurt  Home Living                      Prior Function            PT Goals (current goals can now be found in the care plan section) Acute Rehab PT Goals Patient Stated Goal: to maximize independence PT Goal Formulation: With family Time For Goal Achievement:  12/05/20 Potential to Achieve Goals: Good Additional Goals Additional Goal #1: Pt will attend to 5/10 objects on L side with minA for verbal cueing to improve L sided attention and awareness Progress towards PT goals: Progressing toward goals    Frequency    Min 3X/week      PT Plan Discharge plan needs to be updated    Co-evaluation              AM-PAC PT "6 Clicks" Mobility   Outcome Measure  Help needed turning from your back to your side while in a flat bed without using bedrails?: A Little Help needed moving from lying on your back to sitting on the side of a flat bed without using bedrails?: A Little Help needed moving to and from a bed to a chair (including a wheelchair)?: A Little Help needed standing up from a chair using your arms (e.g., wheelchair or bedside chair)?: A Little Help needed to walk in hospital room?: A Little Help needed climbing 3-5 steps with a railing? : A Lot 6 Click Score: 17    End of Session Equipment Utilized During Treatment: Gait belt Activity Tolerance: Patient tolerated treatment well Patient left: in chair;with call bell/phone within reach;with chair alarm set;with family/visitor present Nurse Communication: Mobility status PT Visit Diagnosis: Unsteadiness on feet (R26.81);Difficulty in walking, not elsewhere classified (R26.2);Other symptoms and signs involving the nervous system (R29.898) Hemiplegia - caused by: Cerebral infarction     Time: 1205-1220 PT Time Calculation (min) (ACUTE ONLY): 15 min  Charges:  $Gait Training: 8-22 mins                    Persephanie Laatsch, PT, GCS 11/26/20,1:08 PM

## 2020-11-26 NOTE — Progress Notes (Signed)
PROGRESS NOTE    Jesse [name]  DUK:[id number] DOB: 1975-10-28 DOA: 11/14/2020 PCP: Pcp, No    No chief complaint on file.   Brief Narrative: 45 year old man with PMHx significant for anxiety, depression, tobacco and EtOH use initially admitted to [facility] 7/12 for chest pain radiating to his neck. Symptoms worsened to include acute receptive/expressive aphasia, unilateral blurry vision in R eye and AMS. Code Stroke called, initial CT Head negative; however, CTA revealed acute dissection involving the aortic arch, through the innominate artery and R common carotid artery.   He was taken emergently to the OR 7/12 for repair. Intubated for procedure, extubated same day. Given aphasia at the time of presentation, Neuro was consulted postoperatively for possible CVA post-dissection. 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 The following day (7/15), patient had increasing O2 requirements and was reintubated (extubated to [facility]NC 7/16). Postoperative course was additionally complicated by coagulopathy, L antecubital superficial venous thrombus and persistent hyperactive delirium (requiring Precedex). He was transferred to [facility] 7/21.  7/22 patient was found to be hypotensive and tachycardic.,  CODE BLUE was called patient was reintubated and was started on vasopressors and admitted by [facility].  Patient was transferred to [facility] on 11/17/2020. Follow up CT head without contrast  showing expected evolution of the stroke.  Diet advanced to dysphagia 2 diet.  Pt seen and examined, wife at bedside. No new complaints.   Assessment & Plan:   Principal Problem:   Acute respiratory failure with hypoxia (HCC) Active Problems:   Respiratory arrest (HCC)   Adverse effects of medication   Hypotension   Encounter for central line placement   Pressure injury of skin  Acute hypoxic respiratory failure in the setting of stroke With respiratory suppression probably secondary to benzodiazepine  administration versus COVID associated hypoxic respiratory arrest and aspiration pneumonia. Patient was intubated, extubated and currently on room air. Pt completed the course of antibiotics, remdesevir , and steroids.  Repeat covid test is positive.  CXR on 7/27 shows mild residual left basilar linear atelectasis. Pt remains on RA not in distress.  Pt has completed 10 days post COVID Positive testing, air borne precautions discontinued.  Acute right MCA/PCA territory infarct Patient had expressive and receptive aphasia on admission, continues to have cognitive deficits.  CT of the head on 7/14 demonstrating right MCA PCA territory infarct   Acute aortic dissection CTA on 11/04/2020 shows arctic dissection involving aortic arch, innominate artery and right common carotid artery S/p repair with TCTS on 11/04/2020  Acute encephalopathy secondary to stroke, respiratory failure Much improved. He is able to communicate with wife. He is more calm today.  Continue with valproic acid.  Avoid benzodiazepines. SLP eva recommending dysphagia 2 diet. He is able to tolerate without any issues.   Thrombocytopenia:  Resolved.   Left brachial superficial venous thrombosis Currently on Lovenox.   Mild AKI with hypernatremia . Probably from free water deficit.  Resolved.  US renal ordered, showed distended bladder and mild bilateral hydronephrosis.  Foley catheter placed, improving  creatinine.  Discussed with Dr.Wrenn over the phone recommend to continue with Foley catheter at this time and if creatinine improves, possibly a voiding trial in the future Renal parameters are back to baseline with IV lfuids and foley catheter.  Recommend outpatient follow up with Urology on discharge.  Recheck labs in am.    Dysphagia/deconditioning Severe protein calorie malnutrition Started him on back on tube feeds as he has poor oral intake.  SLP re  consulted, advanced to dysphagia 2 diet.    Pressure  injury Pressure Injury 11/14/20 Vertebral column Stage 1 -  Intact skin with non-blanchable redness of a localized area usually over a bony prominence. 3x4 area w/ scab on outside and pink center on bony prominince (Active)  11/14/20 0800  Location: Vertebral column  Location Orientation:   Staging: Stage 1 -  Intact skin with non-blanchable redness of a localized area usually over a bony prominence.  Wound Description (Comments): 3x4 area w/ scab on outside and pink center on bony prominince  Present on Admission: Yes  Wound care consulted.    Hypertension;  BP parameters are optimal.  Currently on amlodipine, increase hydralazine to 50 mg TID, added coreg 3.125 BID.     Anemia of acute illness:  Macrocytic,  Anemia panel shows low iron levels.  B12 Levels are adequate.  Will start on iron supplementation on discharge.    Elevated liver enzymes Unclear etiology, improving at this time.   No nausea or vomiting.    In view of poor access a PICC line was placed for blood draws and medications. Therapy eval recommending CIR vs SNF.  Consult placed.   DVT prophylaxis: Lovenox Code Status: Full code  Family Communication: wife at bedside.  Disposition:   Status is: Inpatient  Remains inpatient appropriate because:Ongoing diagnostic testing needed not appropriate for outpatient work up, Unsafe d/c plan, and IV treatments appropriate due to intensity of illness or inability to take PO  Dispo: The patient is from:  CIR              Anticipated d/c is to: SNF              Patient currently is medically stable to d/c.   Difficult to place patient Yes       Consultants:  PCCM.   Procedures:   S/P aortic dissection repair on 11/05/20    Antimicrobials:  Antibiotics Given (last 72 hours)     None         Subjective: No new complaints.   Objective: Vitals:   11/26/20 0029 11/26/20 0334 11/26/20 0738 11/26/20 1105  BP: (!) 145/91 (!) 152/90 (!) 122/95 (!)  116/104  Pulse: 85 95  93  Resp: 11 11 14 16   Temp: 98.5 F (36.9 C) 97.7 F (36.5 C) 98.3 F (36.8 C) 98.1 F (36.7 C)  TempSrc: Oral Oral Axillary Axillary  SpO2: 100% 100% 100% 100%  Weight:  61.1 kg    Height:        Intake/Output Summary (Last 24 hours) at 11/26/2020 1120 Last data filed at 11/26/2020 0752 Gross per 24 hour  Intake 1080 ml  Output 3050 ml  Net -1970 ml    Filed Weights   11/25/20 0500 11/25/20 0518 11/26/20 0334  Weight: 61 kg 61 kg 61.1 kg    Examination:  General exam: cachetic looking gentleman, not in distress.  Respiratory system: air entry fair, no wheezing heard.  Cardiovascular system: RRR no JVD, no pedal edema.  Gastrointestinal system: Abdomen is soft, NT BS+ has cortrack for tube feeds temporarily.  Central nervous system: alert, following simple commands.  Extremities: No leg edema.  Skin: stage 1 back pressure injury.   Psychiatry: calmer .     Data Reviewed: I have personally reviewed following labs and imaging studies  CBC: Recent Labs  Lab 11/20/20 0130 11/24/20 0535 11/26/20 0500  WBC 7.3 6.3 6.7  NEUTROABS  --  4.2  --  HGB 10.8* 10.4* 11.3*  HCT 34.9* 31.7* 33.7*  MCV 105.4* 99.4 98.0  PLT 321 332 395     Basic Metabolic Panel: Recent Labs  Lab 11/20/20 0130 11/22/20 1644 11/23/20 0923 11/24/20 0535 11/26/20 0500  NA 146* 139 135 135 135  K 3.9 4.1 4.5 4.1 3.9  CL 112* 107 106 105 101  CO2 25 23 21* 21* 26  GLUCOSE 112* 80 133* 79 105*  BUN 74* 53* 44* 31* 31*  CREATININE 1.62* 1.54* 1.40* 1.13 0.96  CALCIUM 8.9 8.7* 8.5* 8.2* 8.4*  MG 2.6*  --   --   --   --   PHOS 4.8*  --   --   --   --      GFR: Estimated Creatinine Clearance: 83.1 mL/min (by C-G formula based on SCr of 0.96 mg/dL).  Liver Function Tests: Recent Labs  Lab 11/20/20 0130 11/23/20 0923  AST 62* 32  ALT 203* 78*  ALKPHOS 113 107  BILITOT 0.5 1.0  PROT 6.4* 6.2*  ALBUMIN 2.9* 2.9*     CBG: Recent Labs  Lab  11/25/20 2039 11/26/20 0027 11/26/20 0336 11/26/20 0733 11/26/20 1104  GLUCAP 161* 108* 142* 133* 117*      Recent Results (from the past 240 hour(s))  SARS CORONAVIRUS 2 (TAT 6-24 HRS) Nasopharyngeal Nasopharyngeal Swab     Status: Abnormal   Collection Time: 11/18/20  4:40 PM   Specimen: Nasopharyngeal Swab  Result Value Ref Range Status   SARS Coronavirus 2 POSITIVE (A) NEGATIVE Final    Comment: (NOTE) SARS-CoV-2 target nucleic acids are DETECTED.  The SARS-CoV-2 RNA is generally detectable in upper and lower respiratory specimens during the acute phase of infection. Positive results are indicative of the presence of SARS-CoV-2 RNA. Clinical correlation with patient history and other diagnostic information is  necessary to determine patient infection status. Positive results do not rule out bacterial infection or co-infection with other viruses.  The expected result is Negative.  Fact Sheet for Patients: HairSlick.nohttps://www.fda.gov/media/138098/download  Fact Sheet for Healthcare Providers: quierodirigir.comhttps://www.fda.gov/media/138095/download  This test is not yet approved or cleared by the [place] FDA and  has been authorized for detection and/or diagnosis of SARS-CoV-2 by FDA under an Emergency Use Authorization (EUA). This EUA will remain  in effect (meaning this test can be used) for the duration of the COVID-19 declaration under Section 564(b)(1) of the Act, 21 U. S.C. section 360bbb-3(b)(1), unless the authorization is terminated or revoked sooner.   Performed at [facility], [address] [place], [place] [address]           Radiology Studies: DG Abd Portable 1V  Result Date: 11/24/2020 CLINICAL DATA:  Feeding tube placement EXAM: PORTABLE ABDOMEN - 1 VIEW COMPARISON:  11/18/2020 FINDINGS: Limited radiograph of the lower chest and upper abdomen was obtained for the purposes of enteric tube localization. Enteric tube is seen coursing below the diaphragm  with distal tip and side port terminating within the expected location of the distal gastric body. Visualized bowel gas pattern is nonobstructive. There is oral contrast seen within the colon. IMPRESSION: Enteric tube within the distal gastric body. Electronically Signed   By: Duanne GuessNicholas  Plundo D.O.   On: 11/24/2020 14:21        Scheduled Meds:  amLODipine  10 mg Oral Daily   vitamin C  500 mg Oral Daily   aspirin  81 mg Oral Daily   baricitinib  2 mg Oral Daily   carvedilol  3.125 mg Oral BID WC   Chlorhexidine Gluconate Cloth  6 each Topical Daily   enoxaparin (LOVENOX) injection  40 mg Subcutaneous Q12H   feeding supplement (OSMOLITE 1.5 CAL)  1,200 mL Per Tube Q24H   feeding supplement (PROSource TF)  45 mL Per Tube QID   hydrALAZINE  50 mg Oral Q8H   insulin aspart  0-15 Units Subcutaneous Q4H   mouth rinse  15 mL Mouth Rinse BID   pantoprazole sodium  40 mg Oral Daily   QUEtiapine  50 mg Oral QHS   sodium chloride flush  10-40 mL Intracatheter Q12H   zinc sulfate  220 mg Oral Daily   Continuous Infusions:     LOS: 12 days        Kathlen Mody, MD [facility]   To contact the attending provider between 7A-7P or the covering provider during after hours 7P-7A, please log into the web site [email or web address] and access using universal [place] password for that web site. If you do not have the password, please call the hospital operator.  11/26/2020, 11:20 AM

## 2020-11-26 NOTE — Progress Notes (Signed)
Inpatient Rehab Admissions Coordinator:   Met with patient and his spouse and brother at the bedside.  They are excited to return to CIR and begin therapies.  I reviewed that ELOS will likely still be between 1-2 weeks, goals of supervision and Barnett Applebaum confirms she can provide that.  Will follow for potential admission in the next few days pending bed availability.   Shann Medal, PT, DPT Admissions Coordinator [phone] 11/26/20  3:01 PM

## 2020-11-27 ENCOUNTER — Inpatient Hospital Stay (HOSPITAL_COMMUNITY)
Admission: RE | Admit: 2020-11-27 | Discharge: 2020-12-11 | DRG: 091 | Disposition: A | Discharge status: Home Health | Payer: Medicaid Other | Source: Intra-hospital | Attending: Physical Medicine & Rehabilitation | Admitting: Physical Medicine & Rehabilitation

## 2020-11-27 ENCOUNTER — Other Ambulatory Visit: Payer: Self-pay

## 2020-11-27 ENCOUNTER — Encounter (HOSPITAL_COMMUNITY): Payer: Self-pay | Admitting: Physical Medicine & Rehabilitation

## 2020-11-27 DIAGNOSIS — L89101 Pressure ulcer of unspecified part of back, stage 1: Secondary | ICD-10-CM | POA: Diagnosis present

## 2020-11-27 DIAGNOSIS — F419 Anxiety disorder, unspecified: Secondary | ICD-10-CM | POA: Diagnosis present

## 2020-11-27 DIAGNOSIS — I6310 Cerebral infarction due to embolism of unspecified precerebral artery: Secondary | ICD-10-CM | POA: Diagnosis not present

## 2020-11-27 DIAGNOSIS — Z681 Body mass index (BMI) 19 or less, adult: Secondary | ICD-10-CM

## 2020-11-27 DIAGNOSIS — U071 COVID-19: Secondary | ICD-10-CM | POA: Diagnosis present

## 2020-11-27 DIAGNOSIS — F17210 Nicotine dependence, cigarettes, uncomplicated: Secondary | ICD-10-CM | POA: Diagnosis present

## 2020-11-27 DIAGNOSIS — G931 Anoxic brain damage, not elsewhere classified: Secondary | ICD-10-CM | POA: Diagnosis present

## 2020-11-27 DIAGNOSIS — I10 Essential (primary) hypertension: Secondary | ICD-10-CM | POA: Diagnosis not present

## 2020-11-27 DIAGNOSIS — R339 Retention of urine, unspecified: Secondary | ICD-10-CM | POA: Diagnosis not present

## 2020-11-27 DIAGNOSIS — R64 Cachexia: Secondary | ICD-10-CM | POA: Diagnosis present

## 2020-11-27 DIAGNOSIS — I69319 Unspecified symptoms and signs involving cognitive functions following cerebral infarction: Secondary | ICD-10-CM

## 2020-11-27 DIAGNOSIS — E43 Unspecified severe protein-calorie malnutrition: Secondary | ICD-10-CM | POA: Diagnosis present

## 2020-11-27 DIAGNOSIS — E871 Hypo-osmolality and hyponatremia: Secondary | ICD-10-CM | POA: Diagnosis not present

## 2020-11-27 DIAGNOSIS — R1312 Dysphagia, oropharyngeal phase: Secondary | ICD-10-CM | POA: Diagnosis not present

## 2020-11-27 DIAGNOSIS — Z79899 Other long term (current) drug therapy: Secondary | ICD-10-CM | POA: Diagnosis not present

## 2020-11-27 DIAGNOSIS — Z8674 Personal history of sudden cardiac arrest: Secondary | ICD-10-CM

## 2020-11-27 DIAGNOSIS — J1282 Pneumonia due to coronavirus disease 2019: Secondary | ICD-10-CM | POA: Diagnosis present

## 2020-11-27 DIAGNOSIS — Z794 Long term (current) use of insulin: Secondary | ICD-10-CM

## 2020-11-27 DIAGNOSIS — I63511 Cerebral infarction due to unspecified occlusion or stenosis of right middle cerebral artery: Secondary | ICD-10-CM | POA: Diagnosis not present

## 2020-11-27 DIAGNOSIS — F32A Depression, unspecified: Secondary | ICD-10-CM | POA: Diagnosis present

## 2020-11-27 DIAGNOSIS — I6931 Attention and concentration deficit following cerebral infarction: Secondary | ICD-10-CM

## 2020-11-27 DIAGNOSIS — R2689 Other abnormalities of gait and mobility: Secondary | ICD-10-CM | POA: Diagnosis present

## 2020-11-27 DIAGNOSIS — Z7982 Long term (current) use of aspirin: Secondary | ICD-10-CM

## 2020-11-27 DIAGNOSIS — D62 Acute posthemorrhagic anemia: Secondary | ICD-10-CM | POA: Diagnosis present

## 2020-11-27 DIAGNOSIS — I69391 Dysphagia following cerebral infarction: Secondary | ICD-10-CM

## 2020-11-27 DIAGNOSIS — I69320 Aphasia following cerebral infarction: Secondary | ICD-10-CM | POA: Diagnosis not present

## 2020-11-27 DIAGNOSIS — N179 Acute kidney failure, unspecified: Secondary | ICD-10-CM | POA: Diagnosis present

## 2020-11-27 DIAGNOSIS — I959 Hypotension, unspecified: Secondary | ICD-10-CM

## 2020-11-27 LAB — GLUCOSE, CAPILLARY
Glucose-Capillary: 135 mg/dL — ABNORMAL HIGH (ref 70–99)
Glucose-Capillary: 101 mg/dL — ABNORMAL HIGH (ref 70–99)
Glucose-Capillary: 173 mg/dL — ABNORMAL HIGH (ref 70–99)
Glucose-Capillary: 121 mg/dL — ABNORMAL HIGH (ref 70–99)

## 2020-11-27 MED ORDER — POLYETHYLENE GLYCOL 3350 17 G PO PACK: 17.0000 g | PACK | Freq: Every day | ORAL | Status: DC | PRN

## 2020-11-27 MED ORDER — FERROUS SULFATE 325 (65 FE) MG PO TABS: 325.0000 mg | ORAL_TABLET | Freq: Every day | ORAL | 3 refills | Status: DC

## 2020-11-27 MED ORDER — PROCHLORPERAZINE MALEATE 5 MG PO TABS: 5.0000 mg | ORAL_TABLET | Freq: Four times a day (QID) | ORAL | Status: DC | PRN

## 2020-11-27 MED ORDER — PANTOPRAZOLE SODIUM 40 MG PO PACK
40.0000 mg | PACK | Freq: Every day | ORAL | Status: DC
  Administered 2020-11-28 – 2020-12-11 (×14): 40 mg via ORAL
  Filled 2020-11-27 (×7): qty 20

## 2020-11-27 MED ORDER — PROSOURCE TF PO LIQD
45.0000 mL | Freq: Four times a day (QID) | ORAL | Status: DC
  Administered 2020-11-27 – 2020-11-29 (×6): 45 mL
  Filled 2020-11-27 (×6): qty 45

## 2020-11-27 MED ORDER — BLOOD PRESSURE CONTROL BOOK
Freq: Once | Status: AC
  Filled 2020-11-27: qty 1

## 2020-11-27 MED ORDER — ZINC SULFATE 220 (50 ZN) MG PO CAPS
220.0000 mg | ORAL_CAPSULE | Freq: Every day | ORAL | Status: DC
Start: 2020-11-27 — End: 2020-12-10

## 2020-11-27 MED ORDER — DIPHENHYDRAMINE HCL 12.5 MG/5ML PO ELIX: 12.5000 mg | ORAL_SOLUTION | Freq: Four times a day (QID) | ORAL | Status: DC | PRN

## 2020-11-27 MED ORDER — ASPIRIN 81 MG PO CHEW: 81.0000 mg | CHEWABLE_TABLET | Freq: Every day | ORAL | Status: AC

## 2020-11-27 MED ORDER — QUETIAPINE FUMARATE 50 MG PO TABS
50.0000 mg | ORAL_TABLET | Freq: Every day | ORAL | Status: DC
  Administered 2020-11-27 – 2020-12-09 (×13): 50 mg via ORAL
  Filled 2020-11-27 (×13): qty 1

## 2020-11-27 MED ORDER — ASCORBIC ACID 500 MG PO TABS
500.0000 mg | ORAL_TABLET | Freq: Every day | ORAL | Status: DC
  Administered 2020-11-28 – 2020-12-11 (×14): 500 mg via ORAL
  Filled 2020-11-27 (×14): qty 1

## 2020-11-27 MED ORDER — ENOXAPARIN SODIUM 40 MG/0.4ML IJ SOSY
40.0000 mg | PREFILLED_SYRINGE | INTRAMUSCULAR | Status: DC
  Administered 2020-11-28 – 2020-12-11 (×14): 40 mg via SUBCUTANEOUS
  Filled 2020-11-27 (×14): qty 0.4

## 2020-11-27 MED ORDER — ORAL CARE MOUTH RINSE
15.0000 mL | Freq: Two times a day (BID) | OROMUCOSAL | Status: DC
  Administered 2020-11-27 – 2020-12-11 (×27): 15 mL via OROMUCOSAL

## 2020-11-27 MED ORDER — PROCHLORPERAZINE 25 MG RE SUPP: 12.5000 mg | Freq: Four times a day (QID) | RECTAL | Status: DC | PRN

## 2020-11-27 MED ORDER — OSMOLITE 1.5 CAL PO LIQD: 1200.0000 mL | ORAL | Status: DC

## 2020-11-27 MED ORDER — ZINC SULFATE 220 (50 ZN) MG PO CAPS
220.0000 mg | ORAL_CAPSULE | Freq: Every day | ORAL | Status: DC
  Administered 2020-11-28 – 2020-12-11 (×14): 220 mg via ORAL
  Filled 2020-11-27 (×14): qty 1

## 2020-11-27 MED ORDER — BISACODYL 10 MG RE SUPP: 10.0000 mg | Freq: Every day | RECTAL | Status: DC | PRN

## 2020-11-27 MED ORDER — ROSUVASTATIN CALCIUM 20 MG PO TABS: 20.0000 mg | ORAL_TABLET | Freq: Every day | ORAL | 11 refills | Status: DC

## 2020-11-27 MED ORDER — OSMOLITE 1.5 CAL PO LIQD
1200.0000 mL | ORAL | Status: DC
  Administered 2020-11-27: 1200 mL
  Filled 2020-11-27: qty 2000

## 2020-11-27 MED ORDER — FLEET ENEMA 7-19 GM/118ML RE ENEM: 1.0000 | ENEMA | Freq: Once | RECTAL | Status: DC | PRN

## 2020-11-27 MED ORDER — ENOXAPARIN SODIUM 40 MG/0.4ML IJ SOSY: 40.0000 mg | PREFILLED_SYRINGE | INTRAMUSCULAR | Status: DC

## 2020-11-27 MED ORDER — CARVEDILOL 3.125 MG PO TABS
3.1250 mg | ORAL_TABLET | Freq: Two times a day (BID) | ORAL | Status: DC
  Administered 2020-11-27 – 2020-12-11 (×28): 3.125 mg via ORAL
  Filled 2020-11-27 (×28): qty 1

## 2020-11-27 MED ORDER — POLYETHYLENE GLYCOL 3350 17 G PO PACK: 17.0000 g | PACK | Freq: Every day | ORAL | 0 refills | Status: DC | PRN

## 2020-11-27 MED ORDER — PROCHLORPERAZINE EDISYLATE 10 MG/2ML IJ SOLN: 5.0000 mg | Freq: Four times a day (QID) | INTRAMUSCULAR | Status: DC | PRN

## 2020-11-27 MED ORDER — ACETAMINOPHEN 325 MG PO TABS
325.0000 mg | ORAL_TABLET | ORAL | Status: DC | PRN
  Filled 2020-11-27: qty 2

## 2020-11-27 MED ORDER — PANTOPRAZOLE SODIUM 40 MG PO PACK
40.0000 mg | PACK | Freq: Every day | ORAL | Status: DC
Start: 2020-11-27 — End: 2020-12-11

## 2020-11-27 MED ORDER — OSMOLITE 1.5 CAL PO LIQD: 1200.0000 mL | ORAL | 0 refills | Status: DC

## 2020-11-27 MED ORDER — ALUM & MAG HYDROXIDE-SIMETH 200-200-20 MG/5ML PO SUSP: 30.0000 mL | ORAL | Status: DC | PRN

## 2020-11-27 MED ORDER — POLYETHYLENE GLYCOL 3350 17 G PO PACK
17.0000 g | PACK | Freq: Every day | ORAL | Status: DC | PRN
  Administered 2020-11-30: 17 g via ORAL
  Filled 2020-11-27: qty 1

## 2020-11-27 MED ORDER — QUETIAPINE FUMARATE 50 MG PO TABS: 50.0000 mg | ORAL_TABLET | Freq: Every day | ORAL | Status: DC

## 2020-11-27 MED ORDER — PROSOURCE TF PO LIQD: 45.0000 mL | Freq: Four times a day (QID) | ORAL | Status: DC

## 2020-11-27 MED ORDER — ASCORBIC ACID 500 MG PO TABS: 500.0000 mg | ORAL_TABLET | Freq: Every day | ORAL | Status: DC

## 2020-11-27 MED ORDER — INSULIN ASPART 100 UNIT/ML IJ SOLN
0.0000 [IU] | INTRAMUSCULAR | Status: DC
  Administered 2020-11-27 – 2020-11-28 (×3): 2 [IU] via SUBCUTANEOUS
  Administered 2020-11-29: 3 [IU] via SUBCUTANEOUS

## 2020-11-27 MED ORDER — GUAIFENESIN-DM 100-10 MG/5ML PO SYRP: 5.0000 mL | ORAL_SOLUTION | Freq: Four times a day (QID) | ORAL | Status: DC | PRN

## 2020-11-27 MED ORDER — ASPIRIN 81 MG PO CHEW
81.0000 mg | CHEWABLE_TABLET | Freq: Every day | ORAL | Status: DC
  Administered 2020-11-28 – 2020-12-11 (×14): 81 mg via ORAL
  Filled 2020-11-27 (×14): qty 1

## 2020-11-27 MED ORDER — BARICITINIB 2 MG PO TABS
2.0000 mg | ORAL_TABLET | Freq: Every day | ORAL | Status: AC
  Administered 2020-11-28: 2 mg via ORAL
  Filled 2020-11-27: qty 1

## 2020-11-27 MED ORDER — AMLODIPINE BESYLATE 10 MG PO TABS: 10.0000 mg | ORAL_TABLET | Freq: Every day | ORAL | Status: DC

## 2020-11-27 MED ORDER — HYDRALAZINE HCL 50 MG PO TABS
50.0000 mg | ORAL_TABLET | Freq: Three times a day (TID) | ORAL | Status: DC
  Administered 2020-11-27 – 2020-12-11 (×41): 50 mg via ORAL
  Filled 2020-11-27 (×41): qty 1

## 2020-11-27 MED ORDER — INSULIN ASPART 100 UNIT/ML IJ SOLN: INTRAMUSCULAR | 11 refills | Status: DC

## 2020-11-27 MED ORDER — TRAZODONE HCL 50 MG PO TABS
25.0000 mg | ORAL_TABLET | Freq: Every evening | ORAL | Status: DC | PRN
  Administered 2020-12-01: 50 mg via ORAL
  Filled 2020-11-27: qty 1

## 2020-11-27 MED ORDER — AMLODIPINE BESYLATE 10 MG PO TABS
10.0000 mg | ORAL_TABLET | Freq: Every day | ORAL | Status: DC
  Administered 2020-11-28 – 2020-12-11 (×14): 10 mg via ORAL
  Filled 2020-11-27 (×14): qty 1

## 2020-11-27 MED ORDER — HYDRALAZINE HCL 50 MG PO TABS: 50.0000 mg | ORAL_TABLET | Freq: Three times a day (TID) | ORAL | Status: DC

## 2020-11-27 MED ORDER — OXYCODONE HCL 5 MG/5ML PO SOLN
5.0000 mg | ORAL | Status: DC | PRN
  Filled 2020-11-27: qty 10

## 2020-11-27 MED ORDER — CARVEDILOL 3.125 MG PO TABS: 3.1250 mg | ORAL_TABLET | Freq: Two times a day (BID) | ORAL | Status: DC

## 2020-11-27 MED ORDER — ENOXAPARIN SODIUM 40 MG/0.4ML IJ SOSY: 40.0000 mg | PREFILLED_SYRINGE | Freq: Two times a day (BID) | INTRAMUSCULAR | Status: DC

## 2020-11-27 MED ORDER — LORAZEPAM 2 MG/ML IJ SOLN: 0.5000 mg | Freq: Four times a day (QID) | INTRAMUSCULAR | Status: DC | PRN

## 2020-11-27 NOTE — Progress Notes (Signed)
Occupational Therapy Treatment Patient Details Name: Jesse [name] MRN: [id number] DOB: [birth year] Today's Date: 11/27/2020    History of present illness Pt is a 45 y.o. male re-admitted 11/14/20 from CIR with acute respiratory failure requiring intubation 7/22-7/23, hypotension, tacycardic.  Pt found to be COVID (+) 7/23.  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 Pt. transferred to [facility] 7/21-7/22.  PMH: anxiety, depression, essential hypertension.   OT comments  Pt making good progress towards OT goals. Pt able to verbalize more complete, appropriate sentences though aphasia still noted. Pt able to mobilize in room via handheld assist at Min A to steadying support. Questionable L inattention noted as pt bumps into furniture at times though able to track within L visual field. Pt overall Min A for basic one step grooming tasks standing at sink with multimodal cues to sequence. Pt continues to benefit from assist to initiate tasks with overall Mod A for LB ADLs. First OT session since pt cleared from COVID precautions. Recommend CIR level therapies as pt motivated, participatory and making progress towards goals.   Follow Up Recommendations  Supervision/Assistance - 24 hour;CIR    Equipment Recommendations  Other (comment) (defer to postacute rehab)    Recommendations for Other Services Rehab consult    Precautions / Restrictions Precautions Precautions: Fall;Sternal Precaution Booklet Issued: No Precaution Comments: cortrak Restrictions Weight Bearing Restrictions: No       Mobility Bed Mobility Overal bed mobility: Needs Assistance Bed Mobility: Supine to Sit     Supine to sit: Supervision;HOB elevated     General bed mobility comments: Supervision with improved ability to initiate with verbal cues only    Transfers Overall transfer level: Needs assistance Equipment used: None Transfers: Sit  to/from Stand Sit to Stand: Min guard         General transfer comment: min guard for balance, no assist to powerup    Balance Overall balance assessment: Needs assistance Sitting-balance support: Feet supported Sitting balance-Leahy Scale: Good     Standing balance support: Single extremity supported;During functional activity Standing balance-Leahy Scale: Poor Standing balance comment: reliant on at least one UE support with mobility, external support needed with only one UE support                           ADL either performed or assessed with clinical judgement   ADL Overall ADL's : Needs assistance/impaired     Grooming: Minimal assistance;Standing;Wash/dry face;Brushing hair Grooming Details (indicate cue type and reason): Min A overall for basic grooming tasks standing at sink, cues (verbal and pointing to area to wash face) to complete task. Min A to brush hair for thoroughness and cues to look in mirror to correct. assist to brush L side of hair thoroughly             Lower Body Dressing: Sitting/lateral leans;Moderate assistance Lower Body Dressing Details (indicate cue type and reason): Mod A to don socks. assist to doff socks and assist to initiate in donning clean socks. Placed sock around toes with pt able to follow directions to don sock completely. tactile cued to cross LE to don other sock without assist             Functional mobility during ADLs: Minimal assistance;Cueing for sequencing;Cueing for safety General ADL Comments: Improving sequencing with initiation cues. Questionable inattention to L side as pt bumping into furniture when  ambulating. Continued assist for balance, cognition for ADLs     Vision   Vision Assessment?: Vision impaired- to be further tested in functional context Additional Comments: questionable L inattention. able to track in all planes though noted to bump into objects with mobility at times   Perception      Praxis      Cognition Arousal/Alertness: Awake/alert Behavior During Therapy: [facility] for tasks assessed/performed Overall Cognitive Status: Difficult to assess Area of Impairment: Safety/judgement;Problem solving                         Safety/Judgement: Decreased awareness of safety;Decreased awareness of deficits   Problem Solving: Requires verbal cues;Requires tactile cues General Comments: Pt with improved ability to verbalize appropriate sentences though aphasia still noted. Pt with improved ability to follow one step directions > 75% of the time though will benefit from assist in initiating some tasks        Exercises     Shoulder Instructions       General Comments VSS on RA    Pertinent Vitals/ Pain       Pain Assessment: Faces Faces Pain Scale: No hurt Pain Intervention(s): Monitored during session  Home Living                                          Prior Functioning/Environment              Frequency  Min 3X/week        Progress Toward Goals  OT Goals(current goals can now be found in the care plan section)  Progress towards OT goals: Progressing toward goals  Acute Rehab OT Goals Patient Stated Goal: go to CIR OT Goal Formulation: Patient unable to participate in goal setting Time For Goal Achievement: 12/01/20 Potential to Achieve Goals: Good ADL Goals Pt Will Perform Grooming: with mod assist;sitting Pt Will Perform Upper Body Bathing: with mod assist;sitting Pt Will Perform Lower Body Dressing: with mod assist;sit to/from stand Pt Will Transfer to Toilet: with mod assist;stand pivot transfer;bedside commode Pt/caregiver will Perform Home Exercise Program: Increased strength;Left upper extremity Additional ADL Goal #1: Pt will follow one step commands with mulitmodal cues with 25% accuracy. Additional ADL Goal #2: Pt will participate in vision assessment to determine impact on ADL and mobility.  Plan Discharge  plan needs to be updated    Co-evaluation                 AM-PAC OT "6 Clicks" Daily Activity     Outcome Measure   Help from another person eating meals?: A Little Help from another person taking care of personal grooming?: A Little Help from another person toileting, which includes using toliet, bedpan, or urinal?: A Lot Help from another person bathing (including washing, rinsing, drying)?: A Lot Help from another person to put on and taking off regular upper body clothing?: A Lot Help from another person to put on and taking off regular lower body clothing?: A Lot 6 Click Score: 14    End of Session Equipment Utilized During Treatment: Gait belt  OT Visit Diagnosis: Other abnormalities of gait and mobility (R26.89);Muscle weakness (generalized) (M62.81);Cognitive communication deficit (R41.841);Other symptoms and signs involving cognitive function Symptoms and signs involving cognitive functions: Cerebral infarction   Activity Tolerance Patient tolerated treatment well   Patient Left in chair;with call bell/phone  within reach;with chair alarm set   Nurse Communication          Time: [phone] OT Time Calculation (min): 26 min  Charges: OT General Charges $OT Visit: 1 Visit OT Treatments $Self Care/Home Management : 8-22 mins $Therapeutic Activity: 8-22 mins  Bradd Canary, OTR/L Acute Rehab Services Office: [phone]    Lorre Munroe 11/27/2020, 11:35 AM

## 2020-11-27 NOTE — Progress Notes (Signed)
Inpatient Rehabilitation  Patient information reviewed and entered into eRehab system by Jesse [name] M. Jesse [name], M.A., CCC/SLP, PPS Coordinator.  Information including medical coding, functional ability and quality indicators will be reviewed and updated through discharge.    

## 2020-11-27 NOTE — PMR Pre-admission (Signed)
PMR Admission Coordinator Pre-Admission Assessment   Patient: Jesse [name] is an 45 y.o., male MRN: [id number] DOB: 04/15/1976 Height: 6' 2" (188 cm) Weight: 62.7 kg   Insurance Information HMO:     PPO:      PCP:      IPA:      80/20:      OTHER: PRIMARY: Uninsured/medicaid pending      Policy#:       Subscriber: CM Name:       Phone#:      Fax#: Pre-Cert#:       Employer: Benefits:  Phone #:      Name: Eff. Date:      Deduct:       Out of Pocket Max:       Life Max: CIR:       SNF: Outpatient:      Co-Pay: Home Health:       Co-Pay: DME:      Co-Pay: Providers:  SECONDARY:       Policy#:      Phone#:   Financial Counselor:       Phone#:   The "Data Collection Information Summary" for patients in Inpatient Rehabilitation Facilities with attached "Privacy Act Statement-Health Care Records" was provided and verbally reviewed with: N/A   Emergency Contact Information Contact Information       Name Relation Home Work Mobile    Jesse,[name] Spouse     [phone]    Jesse,THOMAS [name] Father     [phone]           Current Medical History  Patient Admitting Diagnosis: CVA, aortic dissection, post-covid   History of Present Illness: Jesse [name] is a 45-year-old right-handed male with history of anxiety/depression with longstanding tobacco alcohol use.  Presented 11/05/2020 with acute onset of aphasia and blurred vision as well as altered mental status.  Admission chemistries unremarkable except sodium 133 glucose 112 creatinine 1.32, alcohol negative, WBC 12,700.  Cranial CT scan negative for acute changes.  Question subtle small loss of gray-white differentiation in the right insular region.  CT angiogram of chest as well as head and neck showed partially imaged type of a aortic dissection involving the ascending aorta and transverse portion but not the descending aorta.  Dissection involves innominate artery with thrombosis of the false lumen.  This extended into the right common  carotid with narrowing of the true lumen to a minimal diameter of 1.5 mm.  No proximal intracranial vessel occlusion.  There was noted occlusion of small distal right M2 and proximal M3 MCA branch.  Echocardiogram with ejection fraction of 55 to 60%.  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 Owen.  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  Mild mass-effect including trace leftward midline shift and again repeated 11/12/2020 for comparison showing interval development of petechial hemorrhage associated with evolving posterior right MCA and MCA/PCA watershed territory infarct no mass occupying hemorrhagic transformation. There was a superficial vein thrombosis left arm noted and intravenous heparin was initiated 11/12/2020 as well as remaining on low-dose aspirin.  EEG completed showing no seizure activity.  Hospital course patient did require intubation followed by pulmonary services.  Patient currently remains n.p.o. with alternative means of nutritional support.  Hospital course complicated by severe agitation delirium and impulsiveness placed on  Seroquel and   valproic acid.  Therapy evaluations completed due to patient decreased functional ability altered mental status was recommended for a comprehensive rehab program on 7/21.  On 7/22 pt was found unresponsive, in respiratory arrest.  Code blue was called and pt was intubated and transferred back to acute care.  Followup head CT showed expected evolution of CVA.  Pt also noted to be covid positive.  Pt required vasopressors.  Extubated and currently tolerating room air.  Completed antibiotics, remdesevir, and steroids.  Repeat chest xray with mild residual left basilar linear atelectasis.  Encephalopathy  resolving.  Therapy ongoing and recommendations are for pt to return to CIR.   Patient's medical record from [facility] has been reviewed by the rehabilitation admission coordinator and physician.   Past Medical History      Past Medical History:  Diagnosis Date   Anxiety     Depression     Essential hypertension     S/P aortic dissection repair 11/05/2020    Straight graft replacement of ascending aorta and proximal transverse aortic arch with re-suspension of native aortic valve and open hemi arch distal anastomosis with aorta to right common carotid bypass and aorta to right subclavian bypass      Family History   family history is not on file.   Prior Rehab/Hospitalizations Has the patient had prior rehab or hospitalizations prior to admission? Yes   Has the patient had major surgery during 100 days prior to admission? Yes              Current Medications   Current Facility-Administered Medications:   acetaminophen (TYLENOL) 160 MG/5ML solution 650 mg, 650 mg, Oral, Q6H PRN, Akula, Vijaya, MD   amLODipine (NORVASC) tablet 10 mg, 10 mg, Oral, Daily, Akula, Vijaya, MD, 10 mg at 11/26/20 0845   ascorbic acid (VITAMIN C) tablet 500 mg, 500 mg, Oral, Daily, Akula, Vijaya, MD, 500 mg at 11/26/20 0846   aspirin chewable tablet 81 mg, 81 mg, Oral, Daily, Akula, Vijaya, MD, 81 mg at 11/26/20 0845   baricitinib (OLUMIANT) tablet 2 mg, 2 mg, Oral, Daily, Akula, Vijaya, MD, 2 mg at 11/26/20 0845   carvedilol (COREG) tablet 3.125 mg, 3.125 mg, Oral, BID WC, Akula, Vijaya, MD, 3.125 mg at 11/26/20 1703   Chlorhexidine Gluconate Cloth 2 % PADS 6 each, 6 each, Topical, Daily, Smith, Daniel C, MD, 6 each at 11/26/20 0847   enoxaparin (LOVENOX) injection 40 mg, 40 mg, Subcutaneous, Q12H, Rathore, Vasundhra, MD, 40 mg at 11/26/20 2231   feeding supplement (OSMOLITE 1.5 CAL) liquid 1,200 mL, 1,200 mL, Per Tube, Q24H, Akula, Vijaya, MD, Last Rate: 75 mL/hr at 11/26/20 1714, 1,200 mL at 11/26/20  1714   feeding supplement (PROSource TF) liquid 45 mL, 45 mL, Per Tube, QID, Akula, Vijaya, MD, 45 mL at 11/26/20 2232   haloperidol lactate (HALDOL) injection 2 mg, 2 mg, Intravenous, Q6H PRN, Akula, Vijaya, MD, 2 mg at 11/23/20 1704   hydrALAZINE (APRESOLINE) injection 20 mg, 20 mg, Intravenous, Q4H PRN, Stretch, Robert J, MD, 20 mg at 11/23/20 2132   hydrALAZINE (APRESOLINE) tablet 50 mg, 50 mg, Oral, Q8H, Akula, Vijaya, MD, 50 mg at 11/27/20 0542   insulin aspart (novoLOG) injection 0-15 Units, 0-15 Units, Subcutaneous, Q4H, Smith, Daniel C, MD, 2 Units at 11/27/20 0400   labetalol (NORMODYNE) injection 10-20 mg, 10-20 mg, Intravenous, Q2H PRN, Rathore, Vasundhra, MD, 20 mg at 11/20/20 1220   LORazepam (ATIVAN) injection 0.5-1 mg, 0.5-1 mg, Intravenous, Q6H PRN, Akula, Vijaya, MD, 1   mg at 11/23/20 1804   MEDLINE mouth rinse, 15 mL, Mouth Rinse, BID, Smith, Daniel C, MD, 15 mL at 11/26/20 0847   oxyCODONE (ROXICODONE) 5 MG/5ML solution 5-10 mg, 5-10 mg, Oral, Q4H PRN, Akula, Vijaya, MD   pantoprazole sodium (PROTONIX) 40 mg/20 mL oral suspension 40 mg, 40 mg, Oral, Daily, Akula, Vijaya, MD, 40 mg at 11/26/20 0846   polyethylene glycol (MIRALAX / GLYCOLAX) packet 17 g, 17 g, Oral, Daily PRN, Akula, Vijaya, MD   QUEtiapine (SEROQUEL) tablet 50 mg, 50 mg, Oral, QHS, Akula, Vijaya, MD, 50 mg at 11/26/20 2224   sodium chloride flush (NS) 0.9 % injection 10-40 mL, 10-40 mL, Intracatheter, Q12H, Akula, Vijaya, MD, 10 mL at 11/26/20 0847   sodium chloride flush (NS) 0.9 % injection 10-40 mL, 10-40 mL, Intracatheter, PRN, Akula, Vijaya, MD   zinc sulfate capsule 220 mg, 220 mg, Oral, Daily, Akula, Vijaya, MD, 220 mg at 11/26/20 0845   Patients Current Diet:  Diet Order                  Diet - low sodium heart healthy             DIET DYS 2 Room service appropriate? No; Fluid consistency: Thin  Diet effective now                         Precautions / Restrictions Precautions Precautions:  Fall, Sternal Precaution Booklet Issued: No Precaution Comments: cortrak Restrictions Weight Bearing Restrictions: No RUE Weight Bearing: Weight bearing as tolerated LUE Weight Bearing: Weight bearing as tolerated Other Position/Activity Restrictions: sternal    Has the patient had 2 or more falls or a fall with injury in the past year? No   Prior Activity Level Community (5-7x/wk): active PTA, no DME, driving, has 3 children   Prior Functional Level Self Care: Did the patient need help bathing, dressing, using the toilet or eating? Independent   Indoor Mobility: Did the patient need assistance with walking from room to room (with or without device)? Independent   Stairs: Did the patient need assistance with internal or external stairs (with or without device)? Independent   Functional Cognition: Did the patient need help planning regular tasks such as shopping or remembering to take medications? Independent   Home Assistive Devices / Equipment Home Assistive Devices/Equipment: None   Prior Device Use: Indicate devices/aids used by the patient prior to current illness, exacerbation or injury? None of the above   Current Functional Level Cognition   Arousal/Alertness: Awake/alert Overall Cognitive Status: Difficult to assess Difficult to assess due to: Impaired communication Current Attention Level: Sustained Orientation Level: Disoriented X4 Following Commands: Follows one step commands consistently Safety/Judgement: Decreased awareness of safety, Decreased awareness of deficits General Comments: patient more verbal this session than previous sessions. Continues to have some word finding difficulty. Attention: Sustained Sustained Attention: Impaired Sustained Attention Impairment: Verbal basic, Functional basic Memory:  (DTA) Awareness: Impaired Problem Solving: Impaired Problem Solving Impairment: Verbal basic, Functional basic    Extremity Assessment (includes  Sensation/Coordination)   Upper Extremity Assessment: Difficult to assess due to impaired cognition RUE Deficits / Details: able to hold R UE up against gravity, but difficult to assess due to cognition LUE Coordination: decreased fine motor, decreased gross motor  Lower Extremity Assessment: Defer to PT evaluation RLE Deficits / Details: pt raises both LEs against gravity at bed level LLE Deficits / Details: pt raises both LEs against gravity at bed level       ADLs   Overall ADL's : Needs assistance/impaired Eating/Feeding: NPO Grooming: Moderate assistance, Sitting, Wash/dry hands, Wash/dry face Grooming Details (indicate cue type and reason): able to wash hands with washcloth when given to pt though unable to follow cues to wipe face (specifically food in facial hair) so assist needed to complete this task Lower Body Dressing: Sitting/lateral leans, Total assistance Toilet Transfer: Minimal assistance, Ambulation, BSC, Regular Toilet, RW Toilet Transfer Details (indicate cue type and reason): Pt overall min guard with use of RW though had R hand on RW and L hand on therapist's shoulders. able to stand without assist from toilet (BSC over regular toilet due to tall stature). On exit of bathroom, trialed without AD with unsteadiness noted with only one hand on therapist's shoulder. Cues to avoid crossing feet and taking big steps Functional mobility during ADLs: Minimal assistance, Rolling walker, Cueing for sequencing, Cueing for safety General ADL Comments: Consistent cues/assist needed for safety due to impulsivity though progressing mobility. Difficulty sequencing ADLs but appears to respond better to tactile cues and initiation of tasks with pt     Mobility   Overal bed mobility: Needs Assistance Bed Mobility: Supine to Sit Rolling: Mod assist Supine to sit: Min assist, HOB elevated Sit to supine: Min assist General bed mobility comments: Patient received up in recliner with wife  present in room.     Transfers   Overall transfer level: Needs assistance Equipment used: None Transfers: Sit to/from Stand Sit to Stand: Min guard General transfer comment: min guard for safety, no physical assist needed to power up, noted to push RW away from in front of self     Ambulation / Gait / Stairs / Wheelchair Mobility   Ambulation/Gait Ambulation/Gait assistance: Min assist Gait Distance (Feet): 150 Feet Assistive device: 1 person hand held assist Gait Pattern/deviations: Step-through pattern, Staggering right, Staggering left General Gait Details: Patient ambulated out in hallway with single hand held assist. Reaches for rail in hallway on occasion. Unsteady with ambulation. Gait velocity: slightly decreased Gait velocity interpretation: <1.8 ft/sec, indicate of risk for recurrent falls     Posture / Balance Dynamic Sitting Balance Sitting balance - Comments: did not challenge balance Balance Overall balance assessment: Needs assistance Sitting-balance support: Feet supported Sitting balance-Leahy Scale: Good Sitting balance - Comments: did not challenge balance Standing balance support: Single extremity supported, During functional activity Standing balance-Leahy Scale: Poor Standing balance comment: reliant on at least one UE support with mobility, external support needed with only one UE support     Special needs/care consideration N/a    Previous Home Environment (from acute therapy documentation) Living Arrangements: Children, Spouse/significant other  Lives With: Spouse Available Help at Discharge: Family, Available 24 hours/day Type of Home: House Home Layout: Able to live on main level with bedroom/bathroom Home Access: Stairs to enter Entrance Stairs-Rails: None Entrance Stairs-Number of Steps: 2 Bathroom Shower/Tub: Tub/shower unit Bathroom Toilet: Standard Bathroom Accessibility: Yes Home Care Services: No Additional Comments: pt non-verbal;  information from CIR charting   Discharge Living Setting Plans for Discharge Living Setting: Patient's home, Lives with (comment) (spouse and 3 children) Type of Home at Discharge: House Discharge Home Layout: Able to live on main level with bedroom/bathroom Discharge Home Access: Stairs to enter Entrance Stairs-Rails: None Entrance Stairs-Number of Steps: 2 Discharge Bathroom Shower/Tub: Tub/shower unit Discharge Bathroom Toilet: Standard Discharge Bathroom Accessibility: Yes How Accessible: Accessible via walker Does the patient have any problems obtaining your medications?: No   Social/Family/Support Systems Patient Roles: Spouse,   Parent Contact Information: children ages 18, 13, and 10 Anticipated Caregiver: spouse, Jesse Anticipated Caregiver's Contact Information: [phone] Ability/Limitations of Caregiver: n/a Caregiver Availability: 24/7 Discharge Plan Discussed with Primary Caregiver: Yes Is Caregiver In Agreement with Plan?: Yes Does Caregiver/Family have Issues with Lodging/Transportation while Pt is in Rehab?: No   Goals Patient/Family Goal for Rehab: PT/OT supervision; SLP supervision to min assist Expected length of stay: 8-12 days Additional Information: agitation/restlessness significantly improved Pt/Family Agrees to Admission and willing to participate: Yes Program Orientation Provided & Reviewed with Pt/Caregiver Including Roles  & Responsibilities: Yes Additional Information Needs: n/a  Barriers to Discharge: Insurance for SNF coverage, Home environment access/layout   Decrease burden of Care through IP rehab admission: n/a   Possible need for SNF placement upon discharge: no   Patient Condition: I have reviewed medical records from [facility], spoken with CSW, and patient and spouse. I met with patient at the bedside for inpatient rehabilitation assessment.  Patient will benefit from ongoing PT, OT, and SLP, can actively participate in 3 hours of therapy  a day 5 days of the week, and can make measurable gains during the admission.  Patient will also benefit from the coordinated team approach during an Inpatient Acute Rehabilitation admission.  The patient will receive intensive therapy as well as Rehabilitation physician, nursing, social worker, and care management interventions.  Due to safety, skin/wound care, disease management, medication administration, pain management, and patient education the patient requires 24 hour a day rehabilitation nursing.  The patient is currently min assist with mobility and basic ADLs.  Discharge setting and therapy post discharge at  home   is anticipated.  Patient has agreed to participate in the Acute Inpatient Rehabilitation Program and will admit today.   Preadmission Screen Completed By:  Caitlin E Warren, PT, DPT 11/27/2020 10:35 AM ______________________________________________________________________   Discussed status with Dr. Jonell Krontz on 11/27/20  at 10:40 AM  and received approval for admission today.   Admission Coordinator:  Caitlin E Warren, PT, DPT, time 10:40 AM /Date 11/27/20     Assessment/Plan: Diagnosis: Debility Does the need for close, 24 hr/day Medical supervision in concert with the patient's rehab needs make it unreasonable for this patient to be served in a less intensive setting? Yes Co-Morbidities requiring supervision/potential complications: acute respiratory failure with hypoxia, respiratory arrest, hypotension, pressure injury of skin, essential hypertension Due to bladder management, bowel management, safety, skin/wound care, disease management, medication administration, pain management, and patient education, does the patient require 24 hr/day rehab nursing? Yes Does the patient require coordinated care of a physician, rehab nurse, PT, OT, and SLP to address physical and functional deficits in the context of the above medical diagnosis(es)? Yes Addressing deficits in the following  areas: balance, endurance, locomotion, strength, transferring, bowel/bladder control, bathing, dressing, feeding, grooming, toileting, swallowing, and psychosocial support Can the patient actively participate in an intensive therapy program of at least 3 hrs of therapy 5 days a week? Yes The potential for patient to make measurable gains while on inpatient rehab is excellent Anticipated functional outcomes upon discharge from inpatient rehab: modified independent PT, modified independent OT, modified independent SLP Estimated rehab length of stay to reach the above functional goals is: 5-7 days Anticipated discharge destination: Home 10. Overall Rehab/Functional Prognosis: excellent     MD Signature: Darcel Frane, MD 

## 2020-11-27 NOTE — TOC Transition Note (Signed)
Transition of Care [facility]) - CM/SW Discharge Note   Patient Details  Name: Jesse [name] MRN: [id number] Date of Birth: 1975-10-30  [facility] CM/SW Contact:  Leone Haven, RN Phone Number: 11/27/2020, 10:10 AM   Clinical Narrative:    Patient to dc to CIR today.   Final next level of care: IP Rehab Facility Barriers to Discharge: No Barriers Identified   Patient Goals and CMS Choice Patient states their goals for this hospitalization and ongoing recovery are:: dc to CIR      Discharge Placement                       Discharge Plan and Services                                     Social Determinants of Health (SDOH) Interventions     Readmission Risk Interventions No flowsheet data found.

## 2020-11-27 NOTE — Progress Notes (Signed)
Inpatient Rehabilitation Medication Review by a Pharmacist  A complete drug regimen review was completed for this patient to identify any potential clinically significant medication issues.  Clinically significant medication issues were identified:  no  Check AMION for pharmacist assigned to patient if future medication questions/issues arise during this admission.  Pharmacist comments:   Time spent performing this drug regimen review (minutes):  20   Jesse [name] 11/27/2020 8:13 PM

## 2020-11-27 NOTE — Discharge Summary (Addendum)
Physician Discharge Summary  Cristin Szatkowski AVW:[id number] DOB: 01-30-1976 DOA: 11/14/2020  PCP: Pcp, No  Admit date: 11/14/2020 Discharge date: 11/27/2020  Admitted From: CIR Disposition:  CIR  Recommendations for Outpatient Follow-up:  Follow up with PCP in 1-2 weeks Please obtain BMP/CBC in one week Please follow up with neurology in 4 weeks.     Discharge Condition:stable. CODE STATUS:full code.  Diet recommendation: Heart Healthy / dysphagia 2 diet.   Brief/Interim Summary: 60-year-old man with PMHx significant for anxiety, depression, tobacco and EtOH use initially admitted to [facility] 7/12 for chest pain radiating to his neck. Symptoms worsened to include acute receptive/expressive aphasia, unilateral blurry vision in R eye and AMS. Code Stroke called, initial CT Head negative; however, CTA revealed acute dissection involving the aortic arch, through the innominate artery and R common carotid artery.   He was taken emergently to the OR 7/12 for repair. Intubated for procedure, extubated same day. Given aphasia at the time of presentation, Neuro was consulted postoperatively for possible CVA post-dissection. 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 The following day (7/15), patient had increasing O2 requirements and was reintubated (extubated to [facility] 7/16). Postoperative course was additionally complicated by coagulopathy, L antecubital superficial venous thrombus and persistent hyperactive delirium (requiring Precedex). He was transferred to [facility] 7/21.   7/22 patient was found to be hypotensive and tachycardic.,  CODE BLUE was called patient was reintubated and was started on vasopressors and admitted by [facility].   Patient was transferred to [facility] on 11/17/2020. Follow up CT head without contrast  showing expected evolution of the stroke. Diet advanced to dysphagia 2 diet.   Discharge Diagnoses:  Principal Problem:   Acute respiratory failure with hypoxia (HCC) Active  Problems:   Respiratory arrest (HCC)   Adverse effects of medication   Hypotension   Encounter for central line placement   Pressure injury of skin   Acute hypoxic respiratory failure in the setting of stroke With respiratory suppression probably secondary to benzodiazepine administration versus COVID associated hypoxic respiratory arrest and aspiration pneumonia. Patient was intubated, extubated and currently on room air. Pt completed the course of antibiotics, remdesevir , and steroids. Repeat covid test is positive. CXR on 7/27 shows mild residual left basilar linear atelectasis. Pt remains on RA not in distress.  Pt has completed 10 days post COVID Positive testing, air borne precautions discontinued.   Acute right MCA/PCA territory infarct Patient had expressive and receptive aphasia on admission, continues to have cognitive deficits.  CT of the head on 7/14 demonstrating right MCA PCA territory infarct. Continue with aspirin and crestor.      Acute aortic dissection CTA on 11/04/2020 shows arctic dissection involving aortic arch, innominate artery and right common carotid artery S/p repair with TCTS on 11/04/2020   Acute encephalopathy secondary to stroke, respiratory failure Much improved. He is able to communicate with wife. He is more calm today.  Continue with valproic acid.  Avoid benzodiazepines. SLP eva recommending dysphagia 2 diet. He is able to tolerate without any issues.    Thrombocytopenia: Resolved.   Left brachial superficial venous thrombosis Currently on Lovenox.     Mild AKI with hypernatremia . Probably from free water deficit.  Resolved.  US renal ordered, showed distended bladder and mild bilateral hydronephrosis. Foley catheter placed, improving  creatinine.  Discussed with Dr.Wrenn over the phone recommend to continue with Foley catheter at this time and if creatinine improves, possibly a voiding trial in the future Renal parameters  are back to  baseline with IV lfuids and foley catheter. Recommend outpatient follow up with Urology on discharge.      Dysphagia/deconditioning Severe protein calorie malnutrition Started him on back on tube feeds as he has poor oral intake.  SLP re consulted, advanced to dysphagia 2 diet.     Pressure injury Pressure Injury 11/14/20 Vertebral column Stage 1 -  Intact skin with non-blanchable redness of a localized area usually over a bony prominence. 3x4 area w/ scab on outside and pink center on bony prominince (Active)  11/14/20 0800  Location: Vertebral column  Location Orientation:  Staging: Stage 1 -  Intact skin with non-blanchable redness of a localized area usually over a bony prominence.  Wound Description (Comments): 3x4 area w/ scab on outside and pink center on bony prominince  Present on Admission: Yes  Wound care consulted.       Hypertension;  BP parameters are optimal.  Currently on amlodipine, increase hydralazine to 50 mg TID, added coreg 3.125 BID.       Anemia of acute illness: Macrocytic, Anemia panel shows low iron levels. B12 Levels are adequate.  Will start on iron supplementation on discharge.       Elevated liver enzymes Unclear etiology, improving at this time.   No nausea or vomiting.      In view of poor access a PICC line was placed for blood draws and medications. Therapy eval recommending CIR vs SNF. Consult placed.     Discharge Instructions  Discharge Instructions     Diet - low sodium heart healthy   Complete by: As directed    Discharge wound care:   Complete by: As directed    Place a silicone foam dressing over affected area. Lift to assess each shift. Change form every 3 days and  PRN rolling of dressing edges.   Increase activity slowly   Complete by: As directed       Allergies as of 11/27/2020   No Known Allergies      Medication List     STOP taking these medications    aspirin-acetaminophen-caffeine 250-250-65 MG  tablet Commonly known as: EXCEDRIN MIGRAINE   Potassium 99 MG Tabs       TAKE these medications    amLODipine 10 MG tablet Commonly known as: NORVASC Take 1 tablet (10 mg total) by mouth daily.   ascorbic acid 500 MG tablet Commonly known as: VITAMIN C Take 1 tablet (500 mg total) by mouth daily.   aspirin 81 MG chewable tablet Chew 1 tablet (81 mg total) by mouth daily.   carvedilol 3.125 MG tablet Commonly known as: COREG Take 1 tablet (3.125 mg total) by mouth 2 (two) times daily with a meal.   feeding supplement (PROSource TF) liquid Place 45 mLs into feeding tube 4 (four) times daily.   feeding supplement (OSMOLITE 1.5 CAL) Liqd Place 1,200 mLs into feeding tube daily.   ferrous sulfate 325 (65 FE) MG tablet Take 1 tablet (325 mg total) by mouth daily.   hydrALAZINE 50 MG tablet Commonly known as: APRESOLINE Take 1 tablet (50 mg total) by mouth every 8 (eight) hours.   insulin aspart 100 UNIT/ML injection Commonly known as: novoLOG Moderate (average weight, post-op) CBG < 70: Implement Hypoglycemia Standing Orders and refer to Hypoglycemia Standing Orders sidebar report CBG 70 - 120: 0 units CBG 121 - 150: 2 units CBG 151 - 200: 3 units CBG 201 - 250: 5 units CBG 251 - 300: 8 units  CBG 301 - 350: 11 units CBG 351 - 400: 15 units CBG > 400: call MD and obtain STAT lab verification   melatonin 5 MG Tabs Place 1 tablet (5 mg total) into feeding tube at bedtime as needed. What changed: reasons to take this   pantoprazole sodium 40 mg/20 mL Pack Commonly known as: PROTONIX Take 20 mLs (40 mg total) by mouth daily.   polyethylene glycol 17 g packet Commonly known as: MIRALAX / GLYCOLAX Take 17 g by mouth daily as needed for moderate constipation.   QUEtiapine 50 MG tablet Commonly known as: SEROQUEL Take 1 tablet (50 mg total) by mouth at bedtime.   rosuvastatin 20 MG tablet Commonly known as: Crestor Take 1 tablet (20 mg total) by mouth daily.   zinc  sulfate 220 (50 Zn) MG capsule Take 1 capsule (220 mg total) by mouth daily.               Discharge Care Instructions  (From admission, onward)           Start     Ordered   11/27/20 0000  Discharge wound care:       Comments: Place a silicone foam dressing over affected area. Lift to assess each shift. Change form every 3 days and  PRN rolling of dressing edges.   11/27/20 1026            Follow-up Information     primary care physician. Schedule an appointment as soon as possible for a visit in 1 week(s).                 No Known Allergies  Consultations: PCCM.     Procedures/Studies: DG Chest 1 View  Result Date: 11/07/2020 CLINICAL DATA:  45 year old male with code stroke. Attempted to pull out endotracheal tube. EXAM: CHEST  1 VIEW COMPARISON:  Chest x-ray 11/06/2020. FINDINGS: An endotracheal tube is in place with tip 8.9 cm above the carina. A nasogastric tube is seen extending into the stomach, however, the tip of the nasogastric tube extends below the lower margin of the image. There is a surgical drain projecting over the medial aspect of the right hemithorax, either a mediastinal drain or medially placed right-sided chest tube. Lung volumes are normal. Opacity in the left lower lobe, favored to represent postoperative atelectasis. Small left pleural effusion. Right lung is clear. No right pleural effusion. No appreciable pneumothorax. No evidence of pulmonary edema. Heart size is normal. Mediastinal contours are within normal limits. Surgical clips are noted to the right-side of the thoracic inlet. IMPRESSION: 1. Support apparatus, as above. 2. Probable atelectasis in the left lower lobe. 3. Small left pleural effusion. Electronically Signed   By: Trudie Reed M.D.   On: 11/07/2020 06:31   DG Abd 1 View  Result Date: 11/09/2020 CLINICAL DATA:  NG tube placement EXAM: ABDOMEN - 1 VIEW COMPARISON:  11/07/2020 FINDINGS: Limited radiograph of the  lower chest and upper abdomen was obtained for the purposes of enteric tube localization. Enteric tube is seen coursing below the diaphragm with distal tip terminating within the expected location of the gastric body. IMPRESSION: Enteric tube tip terminating within the gastric body. Electronically Signed   By: Duanne Guess D.O.   On: 11/09/2020 10:52   CT HEAD WO CONTRAST  Result Date: 11/18/2020 CLINICAL DATA:  Stroke follow-up EXAM: CT HEAD WITHOUT CONTRAST TECHNIQUE: Contiguous axial images were obtained from the base of the skull through the vertex without intravenous contrast. COMPARISON:  11/14/2020 FINDINGS: Brain: Evolving infarction of the right parietotemporal lobes and lateral right occipital lobe again identified. Edema has decreased with improvement and mild mass effect. As before, there are areas of apparent cortical sparing that probably reflect laminar necrosis or petechial hemorrhage. There is no discrete hematoma. There are new small bilateral cerebellar infarcts, some of which may been subtly present on the prior study. No hydrocephalus or extra-axial collection. Vascular: No hyperdense vessel. Skull: No new finding. Sinuses/Orbits: No acute finding. Other: None. IMPRESSION: Evolving posterior right MCA territory infarction with decreased edema and mild mass effect. No new hemorrhage. New acute or subacute small bilateral cerebellar infarcts. Some may have been subtly present on the prior CT. Electronically Signed   By: Guadlupe Spanish M.D.   On: 11/18/2020 20:36   CT HEAD WO CONTRAST  Result Date: 11/14/2020 CLINICAL DATA:  Stroke follow-up. EXAM: CT HEAD WITHOUT CONTRAST TECHNIQUE: Contiguous axial images were obtained from the base of the skull through the vertex without intravenous contrast. COMPARISON:  CT head 11/12/2020.  MRI head 11/07/2020 FINDINGS: Brain: Cytotoxic edema compatible with acute infarct in the right temporoparietal lobe. Interval development of progressive  intermediate density in the cortex throughout the infarct which is diffuse and significantly more prominent compared with the prior CT. Review of the prior MRI reveals no spared cortex in the infarct with diffuse restricted diffusion. This may represent evolution of the infarct with laminar necrosis. Hemorrhagic transformation in the cortex with intermediate density blood is possible the difficult to differentiate on CT alone. Ventricle size normal.  No midline shift.  No new area of infarct. Vascular: Negative for hyperdense vessel Skull: Negative Sinuses/Orbits: Mild mucosal edema paranasal sinuses. Negative orbit Other: None IMPRESSION: Acute infarct in the posterior right MCA territory. There is progressive gyral intermediate density throughout the infarct with significant change from 2 days ago. It is unclear if this is evolution of infarct with laminar necrosis versus petechial hemorrhage related to hemorrhagic transformation. Differentiation of these 2 findings could be best performed with MRI. Electronically Signed   By: Marlan Palau M.D.   On: 11/14/2020 11:01   CT HEAD WO CONTRAST  Result Date: 11/12/2020 CLINICAL DATA:  Neuro deficit, acute stroke suspected. EXAM: CT HEAD WITHOUT CONTRAST TECHNIQUE: Contiguous axial images were obtained from the base of the skull through the vertex without intravenous contrast. COMPARISON:  CT head 11/07/2019. 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 Interval development faint curvilinear gyral hyperdensity in this region, compatible with petechial hemorrhage. No mass occupying hemorrhagic transformation. Mass effect is mildly progressed with approximately 4 mm of leftward midline shift at the foramen of Monro. No evidence of new/interval acute large vascular territory infarct. No hydrocephalus. Basal cisterns are patent. No extra-axial fluid collection. Vascular: Better  evaluated on prior MRA. Skull: No acute fracture. Sinuses/Orbits: Polypoid mucosal thickening in the right sphenoid sinus and mild ethmoid air cell mucosal thickening. No acute orbital findings. Other: No mastoid effusions. 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 No mass occupying hemorrhagic transformation. Cytotoxic edema and mass effect is mildly progressed with approximately 4 mm of leftward midline shift at the foramen of Monro (previously 3 mm when remeasured). Electronically Signed   By: Feliberto Harts MD   On: 11/12/2020 08:02   CT HEAD WO CONTRAST  Result Date: 11/06/2020 CLINICAL DATA:  44  year old male code stroke presentation. Type a aortic dissection. Right carotid involvement and occlusion, distal right M2 occlusion. Subsequent encounter. EXAM: CT HEAD WITHOUT CONTRAST TECHNIQUE: Contiguous axial images were obtained from the base of the skull through the vertex without intravenous contrast. COMPARISON:  CTA head and neck, and CT headi 11/04/2020 FINDINGS: Brain: Confluent cytotoxic edema throughout a roughly 8 cm area of the posterior right hemisphere involving the posterior right MCA and right MCA/PCA watershed area. See sagittal image 15. Mild regional mass effect. Mild mass effect on the right lateral ventricle and trace leftward midline shift (series 3, image 23). No hemorrhagic transformation. Basilar cisterns remain patent. No ventriculomegaly. Deep gray nuclei appear stable. Left hemisphere and posterior fossa gray-white matter differentiation is stable. Vascular: No large vessel hyperdensity. Skull: Negative. Sinuses/Orbits: Visualized paranasal sinuses and mastoids are stable and well aerated. Other: Visualized orbits and scalp soft tissues are within normal limits. IMPRESSION: 1. Relatively large infarct with cytotoxic edema in the  posterior Right MCA and MCA/PCA watershed territories. Mild mass effect including trace leftward midline shift. No hemorrhagic transformation. 2. Preserved gray-white matter differentiation elsewhere. Electronically Signed   By: Odessa Fleming M.D.   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 Status post graft repair. EXAM: MRI HEAD WITHOUT CONTRAST MRA HEAD WITHOUT CONTRAST MRA NECK WITHOUT AND WITH CONTRAST TECHNIQUE: Multiplanar, multi-echo pulse sequences of the brain and surrounding structures were acquired without intravenous contrast. Angiographic images of the Circle of Willis were acquired using MRA technique without intravenous contrast. Angiographic images of the neck were acquired using MRA technique without and with intravenous contrast. Carotid stenosis measurements (when applicable) are obtained utilizing NASCET criteria, using the distal internal carotid diameter as the denominator. CONTRAST:  6.69mL GADAVIST GADOBUTROL 1 MMOL/ML IV SOLN COMPARISON:  Prior CTs from 11/06/2020 and 11/04/2020 FINDINGS: MRI HEAD FINDINGS Brain: Cerebral volume within normal limits. Few scattered subcentimeter foci of T2/FLAIR hyperintensity noted involving the periventricular and deep white matter both cerebral hemispheres, consistent with chronic microvascular ischemic disease, mild in nature. Moderately large area of restricted diffusion involving the right temporal occipital and parietal region, consistent with right MCA and MCA/PCA watershed infarct, relatively stable from prior CT. Associated gyral swelling and edema with regional mass effect. Associated right-to-left shift now measures 5 mm, slightly increased from prior. No associated hemorrhage. Multiple additional scattered subcentimeter acute ischemic  infarcts noted involving the bilateral cerebral hemispheres, embolic in nature. Largest of these additional foci seen at the high posterior right parietal region and measures 8 mm (series 5, image 94). Patchy involvement of the basal ganglia/corona radiata noted. Few additional punctate ischemic infarcts noted involving the right cerebellum. No associated hemorrhage or mass effect about these additional infarcts. Gray-white matter differentiation otherwise maintained. No other areas of chronic cortical infarction. No acute intracranial hemorrhage. Few scattered punctate chronic micro hemorrhages noted involving both cerebral hemispheres, likely related to underlying hypertension. No mass lesion. No hydrocephalus or ventricular trapping. Basilar cisterns remain patent. No extra-axial fluid collection. Pituitary gland suprasellar region normal. Midline structures intact. Vascular: Major intracranial vascular flow voids are maintained. Skull and upper cervical spine: Craniocervical junction within normal limits. Bone marrow signal intensity normal. No scalp soft tissue abnormality. Sinuses/Orbits: Globes and orbital soft tissues within normal limits. Scattered mucosal thickening noted throughout the ethmoidal air  cells and maxillary sinuses. Fluid seen within the nasopharynx with small to moderate bilateral mastoid effusions. Patient is intubated. Other: None. MRA HEAD FINDINGS Anterior circulation: Widely patent antegrade flow seen within the distal cervical segments of both internal carotid arteries, with interval re-establishment of flow within the cervical right ICA since prior. Both ICAs remain patent to the termini without definite stenosis. Apparent focal stenoses involving the anterior genus of both cavernous ICAs on MIP reconstructions favored to be artifactual in nature. A1 segments patent bilaterally. Normal anterior communicating artery complex. Anterior cerebral arteries patent to their distal aspects  without stenosis. No M1 stenosis or occlusion. Normal MCA bifurcations. Previously seen right MCA branch occlusions not definitely seen by MRA. Right MCA branches now appear well perfused and symmetric. Posterior circulation: Both V4 segments patent to the vertebrobasilar junction without stenosis. Right vertebral artery dominant. Partially visualized PICA patent. Basilar patent to its distal aspect without stenosis. Superior cerebellar arteries patent bilaterally. Right PCA primarily supplied via the basilar. Fetal type origin of the left PCA. Both PCAs remain well perfused to their distal aspects. Anatomic variants: Fetal type origin of the left PCA.  No aneurysm. 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 The visualized native arch is normal in caliber. The native left common carotid and subclavian arteries arise directly from the native arch. There has also been interval performance of an aorta to right subclavian bypass that supplies the right common and subclavian arteries. No visible stenosis or adverse features. Right carotid system: Right CCA now seen arising from the right subclavian bypass. Previously seen right CCA dissection has been repaired, with the right CCA now seen widely patent to the bifurcation. No significant atheromatous narrowing or irregularity about the right carotid bulb. Right ICA now patent and well perfused to the skull base without stenosis or occlusion. No MRA evidence for residual dissection. Left carotid system: Left CCA arises from the repaired native aortic arch and is widely patent to the bifurcation. No significant narrowing about the left carotid bulb. Left ICA patent distally to the skull base without stenosis, evidence for dissection or occlusion. Vertebral arteries: Left vertebral artery arises from the proximal left subclavian artery, just beyond its takeoff from the  native aortic arch. Right vertebral artery arises from the right subclavian bypass. 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 Vertebral arteries themselves are patent within the neck without stenosis, evidence for dissection, or occlusion. Right vertebral artery dominant. Other: None. IMPRESSION: MRI HEAD IMPRESSION: 1. 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 Associated regional mass effect with 5 mm of right-to-left shift, slightly increased from prior. 2. Additional scattered subcentimeter acute ischemic infarcts involving the bilateral cerebral hemispheres and right cerebellum, embolic in nature. No associated hemorrhage or mass effect. 3. No other acute intracranial abnormality. MRA HEAD IMPRESSION: 1. Interval re-establishment of flow within the right ICA, now patent to the terminus. Right MCA well perfused, with no visible persistent downstream occlusion evident by MRA. 2. Otherwise stable and unremarkable intracranial MRA. MRA NECK IMPRESSION: 1. 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 2. Right carotid artery system supplied via the right subclavian bypass, and is now widely patent without  residual dissection or other abnormality. 3. Left carotid artery system supplied via the native aortic arch, and remains widely patent without stenosis or other abnormality. 4. 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 Follow-up examination with dedicated CTA could be performed for further evaluation of this finding as warranted. 5. Both vertebral arteries remain widely patent within the neck. Right  vertebral artery dominant. Electronically Signed   By: Rise Mu M.D.   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 Status post graft repair. EXAM: MRI HEAD WITHOUT CONTRAST MRA HEAD WITHOUT CONTRAST MRA NECK WITHOUT AND WITH CONTRAST TECHNIQUE: Multiplanar, multi-echo pulse sequences of the brain and surrounding structures were acquired without intravenous contrast. Angiographic images of the Circle of Willis were acquired using MRA technique without intravenous contrast. Angiographic images of the neck were acquired using MRA technique without and with intravenous contrast. Carotid stenosis measurements (when applicable) are obtained utilizing NASCET criteria, using the distal internal carotid diameter as the denominator. CONTRAST:  6.64mL GADAVIST GADOBUTROL 1 MMOL/ML IV SOLN COMPARISON:  Prior CTs from 11/06/2020 and 11/04/2020 FINDINGS: MRI HEAD FINDINGS Brain: Cerebral volume within normal limits. Few scattered subcentimeter foci of T2/FLAIR hyperintensity noted involving the periventricular and deep white matter both cerebral hemispheres, consistent with chronic microvascular ischemic disease, mild in nature. Moderately large area of restricted diffusion involving the right temporal occipital and parietal region, consistent with right MCA and MCA/PCA watershed infarct, relatively stable from prior CT. Associated gyral swelling and edema with regional mass effect. Associated right-to-left shift now measures 5 mm, slightly increased from prior. No associated hemorrhage. Multiple additional scattered subcentimeter acute ischemic infarcts noted involving the bilateral cerebral hemispheres, embolic in nature. Largest of these additional foci seen at the high posterior right parietal region  and measures 8 mm (series 5, image 94). Patchy involvement of the basal ganglia/corona radiata noted. Few additional punctate ischemic infarcts noted involving the right cerebellum. No associated hemorrhage or mass effect about these additional infarcts. Gray-white matter differentiation otherwise maintained. No other areas of chronic cortical infarction. No acute intracranial hemorrhage. Few scattered punctate chronic micro hemorrhages noted involving both cerebral hemispheres, likely related to underlying hypertension. No mass lesion. No hydrocephalus or ventricular trapping. Basilar cisterns remain patent. No extra-axial fluid collection. Pituitary gland suprasellar region normal. Midline structures intact. Vascular: Major intracranial vascular flow voids are maintained. Skull and upper cervical spine: Craniocervical junction within normal limits. Bone marrow signal intensity normal. No scalp soft tissue abnormality. Sinuses/Orbits: Globes and orbital soft tissues within normal limits. Scattered mucosal thickening noted throughout the ethmoidal air cells and maxillary sinuses. Fluid seen within the nasopharynx with small to moderate bilateral mastoid effusions. Patient is intubated. Other: None. MRA HEAD FINDINGS Anterior circulation: Widely patent antegrade flow seen within the distal cervical segments of both internal carotid arteries, with interval re-establishment of flow within the cervical right ICA since prior. Both ICAs remain patent to the termini without definite stenosis. Apparent focal stenoses involving the anterior genus of both cavernous ICAs on MIP reconstructions favored to be artifactual in nature. A1 segments patent bilaterally. Normal anterior communicating artery complex. Anterior cerebral arteries patent to their distal aspects without stenosis. No M1 stenosis or occlusion. Normal MCA bifurcations. Previously seen right MCA branch occlusions  not definitely seen by MRA. Right MCA branches now  appear well perfused and symmetric. Posterior circulation: Both V4 segments patent to the vertebrobasilar junction without stenosis. Right vertebral artery dominant. Partially visualized PICA patent. Basilar patent to its distal aspect without stenosis. Superior cerebellar arteries patent bilaterally. Right PCA primarily supplied via the basilar. Fetal type origin of the left PCA. Both PCAs remain well perfused to their distal aspects. Anatomic variants: Fetal type origin of the left PCA.  No aneurysm. 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 The visualized native arch is normal in caliber. The native left common carotid and subclavian arteries arise directly from the native arch. There has also been interval performance of an aorta to right subclavian bypass that supplies the right common and subclavian arteries. No visible stenosis or adverse features. Right carotid system: Right CCA now seen arising from the right subclavian bypass. Previously seen right CCA dissection has been repaired, with the right CCA now seen widely patent to the bifurcation. No significant atheromatous narrowing or irregularity about the right carotid bulb. Right ICA now patent and well perfused to the skull base without stenosis or occlusion. No MRA evidence for residual dissection. Left carotid system: Left CCA arises from the repaired native aortic arch and is widely patent to the bifurcation. No significant narrowing about the left carotid bulb. Left ICA patent distally to the skull base without stenosis, evidence for dissection or occlusion. Vertebral arteries: Left vertebral artery arises from the proximal left subclavian artery, just beyond its takeoff from the native aortic arch. Right vertebral artery arises from the right subclavian bypass. 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 Vertebral arteries themselves are patent within the neck without stenosis, evidence for dissection, or occlusion. Right vertebral artery dominant. Other: None. IMPRESSION: MRI HEAD IMPRESSION: 1. 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 Associated regional mass effect with 5 mm of right-to-left shift, slightly increased from prior. 2. Additional scattered subcentimeter acute ischemic infarcts involving the bilateral cerebral hemispheres and right cerebellum, embolic in nature. No associated hemorrhage or mass effect. 3. No other acute intracranial abnormality. MRA HEAD IMPRESSION: 1. Interval re-establishment of flow within the right ICA, now patent to the terminus. Right MCA well perfused, with no visible persistent downstream occlusion evident by MRA. 2. Otherwise stable and unremarkable intracranial MRA. MRA NECK IMPRESSION: 1. 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 2. Right carotid artery system supplied via the right subclavian bypass, and is now widely patent without residual dissection or other abnormality. 3. Left carotid artery system supplied via the native aortic arch, and remains widely patent without stenosis or other abnormality. 4. 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 Follow-up examination with dedicated CTA could be performed for further evaluation of this finding as warranted. 5. Both vertebral arteries remain widely patent within the neck. Right vertebral artery dominant. Electronically Signed   By: Rise Mu M.D.   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 Status post graft repair. EXAM: MRI HEAD WITHOUT CONTRAST MRA HEAD WITHOUT CONTRAST MRA NECK WITHOUT AND WITH CONTRAST TECHNIQUE: Multiplanar, multi-echo pulse sequences of the brain and surrounding structures were acquired without intravenous contrast. Angiographic images of the Circle of Willis were acquired using MRA technique without intravenous contrast. Angiographic images of the neck were acquired using MRA technique without and with intravenous contrast. Carotid stenosis measurements (when applicable) are obtained utilizing NASCET criteria, using the distal internal carotid diameter as the denominator. CONTRAST:  6.19mL GADAVIST GADOBUTROL 1 MMOL/ML IV SOLN COMPARISON:  Prior CTs from 11/06/2020 and 11/04/2020 FINDINGS: MRI HEAD FINDINGS Brain: Cerebral volume within normal limits. Few scattered subcentimeter foci of T2/FLAIR hyperintensity noted involving the periventricular and deep white matter both cerebral hemispheres, consistent with chronic microvascular ischemic disease, mild in nature. Moderately large area of restricted diffusion involving the right temporal occipital and parietal region, consistent with right MCA and MCA/PCA watershed infarct, relatively stable from prior CT. Associated gyral swelling and edema with regional mass effect. Associated right-to-left shift now measures 5 mm, slightly increased from prior. No associated hemorrhage. Multiple additional scattered subcentimeter acute ischemic infarcts noted involving the bilateral cerebral hemispheres, embolic in nature. Largest of these additional foci seen at the high posterior right parietal region and measures 8 mm (series 5, image 94). Patchy involvement of the basal ganglia/corona radiata noted. Few additional punctate ischemic infarcts noted involving the right  cerebellum. No associated hemorrhage or mass effect about these additional infarcts. Gray-white matter differentiation otherwise maintained. No other areas of chronic cortical infarction. No acute intracranial hemorrhage. Few scattered punctate chronic micro hemorrhages noted involving both cerebral hemispheres, likely related to underlying hypertension. No mass lesion. No hydrocephalus or ventricular trapping. Basilar cisterns remain patent. No extra-axial fluid collection. Pituitary gland suprasellar region normal. Midline structures intact. Vascular: Major intracranial vascular flow voids are maintained. Skull and upper cervical spine: Craniocervical junction within normal limits. Bone marrow signal intensity normal. No scalp soft tissue abnormality. Sinuses/Orbits: Globes and orbital soft tissues within normal limits. Scattered mucosal thickening noted throughout the ethmoidal air cells and maxillary sinuses. Fluid seen within the nasopharynx with small to moderate bilateral mastoid effusions. Patient is intubated. Other: None. MRA HEAD FINDINGS Anterior circulation: Widely patent antegrade flow seen within the distal cervical segments of both internal carotid arteries, with interval re-establishment of flow within the cervical right ICA since prior. Both ICAs remain patent to the termini without definite stenosis. Apparent focal stenoses involving the anterior genus of both cavernous ICAs on MIP reconstructions favored to be artifactual in nature. A1 segments patent bilaterally. Normal anterior communicating artery complex. Anterior cerebral arteries patent to their distal aspects without stenosis. No M1 stenosis or occlusion. Normal MCA bifurcations. Previously seen right MCA branch occlusions not definitely seen by MRA. Right MCA branches now appear well perfused and symmetric. Posterior circulation: Both V4 segments patent to the vertebrobasilar junction without stenosis. Right vertebral artery dominant.  Partially visualized PICA patent. Basilar patent to its distal aspect without stenosis. Superior cerebellar arteries patent bilaterally. Right PCA primarily supplied via the basilar. Fetal type origin of the left PCA. Both PCAs remain well perfused to their distal aspects. Anatomic variants: Fetal type origin of the left PCA.  No aneurysm. 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 The visualized native arch is normal in caliber. The native left common carotid and subclavian  arteries arise directly from the native arch. There has also been interval performance of an aorta to right subclavian bypass that supplies the right common and subclavian arteries. No visible stenosis or adverse features. Right carotid system: Right CCA now seen arising from the right subclavian bypass. Previously seen right CCA dissection has been repaired, with the right CCA now seen widely patent to the bifurcation. No significant atheromatous narrowing or irregularity about the right carotid bulb. Right ICA now patent and well perfused to the skull base without stenosis or occlusion. No MRA evidence for residual dissection. Left carotid system: Left CCA arises from the repaired native aortic arch and is widely patent to the bifurcation. No significant narrowing about the left carotid bulb. Left ICA patent distally to the skull base without stenosis, evidence for dissection or occlusion. Vertebral arteries: Left vertebral artery arises from the proximal left subclavian artery, just beyond its takeoff from the native aortic arch. Right vertebral artery arises from the right subclavian bypass. 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 Vertebral arteries themselves are patent within the neck without stenosis,  evidence for dissection, or occlusion. Right vertebral artery dominant. Other: None. IMPRESSION: MRI HEAD IMPRESSION: 1. 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 Associated regional mass effect with 5 mm of right-to-left shift, slightly increased from prior. 2. Additional scattered subcentimeter acute ischemic infarcts involving the bilateral cerebral hemispheres and right cerebellum, embolic in nature. No associated hemorrhage or mass effect. 3. No other acute intracranial abnormality. MRA HEAD IMPRESSION: 1. Interval re-establishment of flow within the right ICA, now patent to the terminus. Right MCA well perfused, with no visible persistent downstream occlusion evident by MRA. 2. Otherwise stable and unremarkable intracranial MRA. MRA NECK IMPRESSION: 1. 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 2. Right carotid artery system supplied via the right subclavian bypass, and is now widely patent without residual dissection or other abnormality. 3. Left carotid artery system supplied via the native aortic arch, and remains widely patent without stenosis or other abnormality. 4. 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 Follow-up examination with dedicated CTA could be performed for further evaluation of this finding as warranted. 5. Both vertebral arteries remain widely patent within the neck. Right vertebral artery dominant. Electronically Signed   By: Rise Mu M.D.   On: 11/07/2020 20:51   US RENAL  Result Date: 11/20/2020 CLINICAL DATA:  Acute kidney injury. EXAM: RENAL / URINARY TRACT ULTRASOUND COMPLETE COMPARISON:  None. FINDINGS: Right Kidney: Renal measurements: 11.1 x 5.9 x 5.1 cm = volume: 173 mL. Mild  hydronephrosis is noted. Echogenicity within normal limits. No mass visualized. Left Kidney: Renal measurements: 9.5 x 5.0 x 4.7 cm = volume: 116 mL. Mild hydronephrosis is noted. Echogenicity within normal limits. No mass visualized. Bladder: Urinary bladder appears to be moderately distended. Other: None. IMPRESSION: Moderately distended urinary bladder. Mild bilateral hydronephrosis. Electronically Signed   By: Lupita Raider M.D.   On: 11/20/2020 21:13   DG CHEST PORT 1 VIEW  Result Date: 11/19/2020 CLINICAL DATA:  Postoperative follow-up.  History of COVID positive. EXAM: PORTABLE CHEST 1 VIEW COMPARISON:  November 14, 2020 FINDINGS: The endotracheal tube and  right internal jugular venous catheter seen on the prior study has been removed. A nasogastric tube is noted with its distal end extending into the body of the stomach. Mild linear atelectasis is seen along the medial aspect of the left lung base. There is no evidence of a pleural effusion or pneumothorax. Radiopaque surgical clips are seen overlying the superior mediastinum. The cardiac silhouette is mildly enlarged and unchanged in size. The visualized skeletal structures are unremarkable. IMPRESSION: Stable cardiomegaly with mild residual left basilar linear atelectasis. Electronically Signed   By: Aram Candela M.D.   On: 11/19/2020 19:34   DG CHEST PORT 1 VIEW  Result Date: 11/14/2020 CLINICAL DATA:  New central line EXAM: PORTABLE CHEST 1 VIEW COMPARISON:  Earlier today FINDINGS: New central line with tip at the SVC. Feeding and endotracheal tubes are in stable position. No mediastinal widening or pneumothorax. The extreme lateral right chest is obscured from view. IMPRESSION: Interval right IJ line without complicating feature. Electronically Signed   By: Marnee Spring M.D.   On: 11/14/2020 09:24   DG Chest Port 1 View  Result Date: 11/14/2020 CLINICAL DATA:  Intubation EXAM: PORTABLE CHEST 1 VIEW COMPARISON:  Yesterday FINDINGS:  Endotracheal tube with tip just above the clavicular heads. The feeding tube at least reaches the stomach. Artifact from support hardware. Mildly improved hazy density at the bases. No visible effusion or pneumothorax. IMPRESSION: 1. New endotracheal tube in unremarkable position. 2. Mildly improved aeration. Electronically Signed   By: Marnee Spring M.D.   On: 11/14/2020 06:18   DG Chest Port 1 View  Result Date: 11/13/2020 CLINICAL DATA:  Shortness of breath. Recent surgery for aortic dissection. EXAM: PORTABLE CHEST 1 VIEW COMPARISON:  Chest x-ray 11/09/2020. FINDINGS: Left costophrenic angle incompletely imaged. Feeding tube is been advanced, tip below left hemidiaphragm. Cardiomegaly again noted. Mild bilateral interstitial prominence suggesting mild interstitial edema. Small right pleural effusion cannot be excluded. No pneumothorax. Surgical clips over the upper chest. IMPRESSION: 1. Interim advancement of feeding tube, tip below left hemidiaphragm. 2. Cardiomegaly again noted. Mild bilateral interstitial prominence suggesting mild interstitial edema. Small right pleural effusion cannot be excluded. Electronically Signed   By: Maisie Fus  Register   On: 11/13/2020 07:29   DG CHEST PORT 1 VIEW  Result Date: 11/09/2020 CLINICAL DATA:  NG tube placement EXAM: PORTABLE CHEST 1 VIEW COMPARISON:  11/08/2020 FINDINGS: Enteric tube tip terminates at the level of the distal esophagus. Stable cardiomediastinal contours. Hazy bibasilar opacities, slightly improved from prior. No pneumothorax is seen, although the lung apices were excluded from the field of view. IMPRESSION: 1. Enteric tube tip terminates at the level of the distal esophagus. Subsequent abdominal radiograph in PACS demonstrates interval advancement. 2. Improving aeration of the lung bases. Electronically Signed   By: Duanne Guess D.O.   On: 11/09/2020 10:52   DG Chest Port 1 View  Result Date: 11/08/2020 CLINICAL DATA:  Self extubation  EXAM: PORTABLE CHEST 1 VIEW COMPARISON:  11/07/2020 FINDINGS: Single frontal view of the chest demonstrates interval removal of the endotracheal tube and enteric catheter. There is a linear radiopaque structure overlying the trachea at the C7 level, indeterminate in nature. Cardiac silhouette is unremarkable. Bibasilar veiling opacities are identified, left greater than right, consistent with underlying consolidation and/or effusion. No pneumothorax. IMPRESSION: 1. Progressive bibasilar veiling opacities, left greater than right, consistent with underlying consolidation and effusions. 2. Interval removal of the endotracheal and enteric catheters. A short linear radiodensity projects over the trachea at the C7  level, of uncertain etiology. Electronically Signed   By: Sharlet Salina M.D.   On: 11/08/2020 17:06   DG CHEST PORT 1 VIEW  Result Date: 11/06/2020 CLINICAL DATA:  45 year old male status post intubation. EXAM: PORTABLE CHEST 1 VIEW COMPARISON:  Chest radiograph dated 11/06/2020. FINDINGS: Endotracheal tube with tip approximately 5.5 cm above the carina. Interval removal of the right IJ catheter. Two inferiorly accessed mediastinal drains again noted. Probable small left pleural effusion. Right infrahilar hazy density, possibly atelectasis. No obvious pneumothorax. Stable cardiomegaly. No acute osseous pathology. IMPRESSION: 1. Interval removal of the right IJ catheter. 2. Endotracheal tube above the carina. 3. Probable small left pleural effusion. Electronically Signed   By: Elgie Collard M.D.   On: 11/06/2020 20:42   DG Chest Port 1 View  Addendum Date: 11/06/2020   ADDENDUM REPORT: 11/06/2020 09:13 ADDENDUM: Also with slight increased retrocardiac opacification and RIGHT basilar opacification since prior study. These findings and findings in the initial report were discussed with the provider as outlined below. These results were called by telephone at the time of interpretation on 11/06/2020 at  9:13 am to provider Tressie Stalker , who verbally acknowledged these results. Electronically Signed   By: Donzetta Kohut M.D.   On: 11/06/2020 09:13   Result Date: 11/06/2020 CLINICAL DATA:  Post aortic dissection repair in a 45 year old male. EXAM: PORTABLE CHEST 1 VIEW COMPARISON:  November 05, 2020. FINDINGS: EKG leads project over the chest. Since the previous study the patient has been extubated. Chest support tubes project over the heart terminating just below the clavicular heads with respect to the more rightward oriented tube, not changed in position. The LEFT-sided tube terminates over the LEFT chest in the retrocardiac region with similar appearance. RIGHT IJ sheath in similar position Swan-Ganz catheter now removed. Persistent tiny RIGHT apical pneumothorax, unchanged. Trachea midline. Cardiomediastinal contours and hilar structures are stable. Graded opacity at the RIGHT lung base is subtle with obscured LEFT hemidiaphragm along the medial aspect when compared to the prior study. Possible deep sulcus sign on the LEFT otherwise no evidence of LEFT-sided pneumothorax. On limited assessment no acute skeletal process. IMPRESSION: 1. Persistent tiny RIGHT apical pneumothorax. 2. Possible deep sulcus sign on the LEFT potentially a very small LEFT pneumothorax. Attention on follow-up. 3. Interval extubation, removal of gastric tube and removal of Swan-Ganz catheter, otherwise stable position of support apparatus. A call is out to the referring provider to further discuss findings in the above case. Electronically Signed: By: Donzetta Kohut M.D. On: 11/06/2020 09:05   DG Chest Port 1 View  Result Date: 11/05/2020 CLINICAL DATA:  45 year old male with open heart surgery. EXAM: PORTABLE CHEST 1 VIEW COMPARISON:  Chest CT dated 11/04/2020. FINDINGS: Right IJ Swan-Ganz catheter with tip likely close to the bifurcation of the main pulmonary trunk. Endotracheal tube with tip approximately 6.5 cm above the carina.  Enteric tube extends below the diaphragm with tip beyond the inferior margin of the image. Inferiorly accessed mediastinal drain. The lungs are clear. There is no pleural effusion. Tiny linear lucency in the right apex may be artifactual or represent minimal right pneumothorax measuring approximately 4 mm in thickness. Close follow-up recommended. The cardiac silhouette is within limits. No acute osseous pathology. IMPRESSION: 1. Status post open heart surgery with support apparatus as above. 2. Possible tiny right apical pneumothorax. Attention on follow-up imaging recommended. These results were called by telephone at the time of interpretation on 11/05/2020 at 4:03 am to the patient's nurse Jess Barters, who  verbally acknowledged these results. Electronically Signed   By: Elgie Collard M.D.   On: 11/05/2020 04:07   DG Abd Portable 1V  Result Date: 11/24/2020 CLINICAL DATA:  Feeding tube placement EXAM: PORTABLE ABDOMEN - 1 VIEW COMPARISON:  11/18/2020 FINDINGS: Limited radiograph of the lower chest and upper abdomen was obtained for the purposes of enteric tube localization. Enteric tube is seen coursing below the diaphragm with distal tip and side port terminating within the expected location of the distal gastric body. Visualized bowel gas pattern is nonobstructive. There is oral contrast seen within the colon. IMPRESSION: Enteric tube within the distal gastric body. Electronically Signed   By: Duanne Guess D.O.   On: 11/24/2020 14:21   DG Abd Portable 1V  Result Date: 11/18/2020 CLINICAL DATA:  Feeding tube placement. EXAM: PORTABLE ABDOMEN - 1 VIEW COMPARISON:  11/09/2020. FINDINGS: Feeding tube noted with tip over the stomach in stable position. No bowel distention. Stool noted throughout the colon. No free air. Degenerative change lumbar spine. IMPRESSION: Feeding tube noted with tip over the stomach in stable position. No bowel distention. Electronically Signed   By: Maisie Fus  Register   On: 11/18/2020  13:53   DG Abd Portable 1V  Result Date: 11/07/2020 CLINICAL DATA:  Feeding tube placement EXAM: PORTABLE ABDOMEN - 1 VIEW COMPARISON:  11/07/2020 at 0310 hours FINDINGS: Interval placement of large bore feeding tube which extends below the diaphragm, distal tip beyond the inferior margin of the film. Additional enteric tube is again seen terminating within the gastric body. Mediastinal drain and left basilar chest tube are seen. IMPRESSION: Interval placement of feeding tube which extends below the diaphragm into the stomach with distal tip beyond the inferior margin of the film. Electronically Signed   By: Duanne Guess D.O.   On: 11/07/2020 12:33   DG Abd Portable 1V  Result Date: 11/07/2020 CLINICAL DATA:  45 year male status post feeding tube placement. EXAM: PORTABLE ABDOMEN - 1 VIEW COMPARISON:  Chest radiograph dated 11/06/2020 and CT of the chest abdomen pelvis dated 11/04/2020. FINDINGS: Partially visualized enteric tube with side-port just distal to the GE junction and tip in the proximal body of the stomach. Inferiorly accessed mediastinal drains noted. No bowel dilatation or evidence of obstruction. No free air. Probable small left pleural effusion. IMPRESSION: Enteric tube with tip in the proximal body of the stomach. Electronically Signed   By: Elgie Collard M.D.   On: 11/07/2020 03:35   DG Swallowing Func-Speech Pathology  Result Date: 11/20/2020 Formatting of this result is different from the original. 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  Straight graft replacement  of ascending aorta and proximal transverse aortic arch with re-suspension of native aortic valve and open hemi arch distal anastomosis with aorta to right common carotid bypass and aorta to right subclavian bypass Past Surgical History: Past Surgical History: Procedure Laterality Date  ASCENDING AORTIC ROOT REPLACEMENT N/A 11/04/2020  Procedure: 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 WITH REPLACEMENT OF ASCENDING AORTA AND HEMIARCH USING HEMASHIELD PLATINUM GRAFT AND HEMASHIELD GOLD 14 X GRAFT, RESUSPENSION OF NATIVE VALVE, AORTA TO RIGHT CAROTID  BYPASS, AORTA TO RIGHT SUBCLAVIAN BYPASS;  Surgeon: Purcell Nails, MD;  [facility];  Service: Open Heart Surgery;  Laterality: N/A;  TEE WITHOUT CARDIOVERSION N/A 11/04/2020  Procedure: TRANSESOPHAGEAL ECHOCARDIOGRAM (TEE);  Surgeon: Purcell Nails, MD;  Location: [facility] OR;  Service: Open Heart Surgery;  Laterality: N/A; HPI: 45 year old man with PMHx significant for anxiety, depression, tobacco and EtOH use initially admitted to [facility] 7/12 for chest pain radiating to his neck. Symptoms worsened to include acute receptive/expressive aphasia, unilateral blurry vision in R eye and AMS. Code Stroke called, initial CT Head negative; however, CTA revealed acute dissection involving the aortic arch, through the innominate artery and R common carotid artery.     He was taken emergently to the OR 7/12 for repair. Intubated for procedure, extubated same day. Given aphasia at the time of presentation, Neuro was consulted postoperatively for possible CVA post-dissection. 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 The following day (7/15), patient had increasing O2 requirements and was reintubated (extubated to [facility] 7/16). Postoperative course was additionally complicated by coagulopathy, L antecubital superficial venous thrombus and persistent hyperactive delirium (requiring Precedex). He was transferred to [facility] 7/21.     7/22 patient was found  to be hypotensive and tachycardic.,  CODE BLUE was called patient was reintubated and was started on vasopressors and admitted by [facility].  Subjective: "That was awesome" (in response to tsp of thin) Assessment / Plan / Recommendation CHL IP CLINICAL IMPRESSIONS 11/20/2020 Clinical Impression Pt presented with oropharyngeal dysphagia characterized by reduced bolus awareness, prolonged mastication, reduced bolus cohesion, and an intermittent pharyngeal delay. Mastication was prolonged with dysphagia 2 solids and all regular texture boluses were spat out or removed by the pt without any attempt at mastication. Pt demonstrated premature spillage to the valleculae and pyriform sinuses which, with the pharyngeal delay, resulted in two instances of penetration (PAS 2, 3) with thin liquids across 14 swallows of thin liquids via cup and straw. Both of these instances were noted when pt was using consecutive swallows. A wet vocal quality was noted throughout the study, but this did not align with any instances of laryngeal invasion. A dysphagia 1 diet with thin liquids will be initiated at this time. SLP will follow to ensure diet tolerance and for advancement as clinically indicated. SLP Visit Diagnosis Dysphagia, oropharyngeal phase (R13.12) Attention and concentration deficit following -- Frontal lobe and executive function deficit following -- Impact on safety and function Risk for inadequate nutrition/hydration;Moderate aspiration risk   CHL IP TREATMENT RECOMMENDATION 11/20/2020 Treatment Recommendations Therapy as outlined in treatment plan below   Prognosis 11/20/2020 Prognosis for Safe Diet Advancement Good Barriers to Reach Goals Cognitive deficits Barriers/Prognosis Comment -- CHL IP DIET RECOMMENDATION 11/20/2020 SLP Diet Recommendations Dysphagia 1 (Puree) solids;Thin liquid Liquid Administration via Cup;Straw Medication Administration Crushed with puree Compensations Minimize environmental distractions;Small  sips/bites;Slow rate Postural Changes Seated upright at 90 degrees   CHL IP OTHER RECOMMENDATIONS 11/20/2020 Recommended Consults -- Oral Care Recommendations Oral care BID Other Recommendations --   CHL IP FOLLOW UP RECOMMENDATIONS 11/20/2020 Follow up Recommendations Skilled Nursing facility   [facility] IP FREQUENCY AND DURATION 11/20/2020 Speech Therapy Frequency (ACUTE ONLY) min 2x/week Treatment Duration 2 weeks      CHL IP ORAL PHASE 11/20/2020 Oral Phase Impaired Oral - Pudding Teaspoon -- Oral - Pudding Cup -- Oral - Honey Teaspoon -- Oral - Honey Cup -- Oral - Nectar Teaspoon -- Oral - Nectar Cup -- Oral - Nectar Straw  Premature spillage;Decreased bolus cohesion Oral - Thin Teaspoon -- Oral - Thin Cup Premature spillage;Decreased bolus cohesion Oral - Thin Straw Premature spillage;Decreased bolus cohesion Oral - Puree Decreased bolus cohesion;Premature spillage Oral - Mech Soft Impaired mastication;Holding of bolus Oral - Regular Impaired mastication Oral - Multi-Consistency -- Oral - Pill -- Oral Phase - Comment --  CHL IP PHARYNGEAL PHASE 11/20/2020 Pharyngeal Phase Impaired Pharyngeal- Pudding Teaspoon -- Pharyngeal -- Pharyngeal- Pudding Cup -- Pharyngeal -- Pharyngeal- Honey Teaspoon -- Pharyngeal -- Pharyngeal- Honey Cup -- Pharyngeal -- Pharyngeal- Nectar Teaspoon -- Pharyngeal -- Pharyngeal- Nectar Cup -- Pharyngeal -- Pharyngeal- Nectar Straw Delayed swallow initiation-vallecula Pharyngeal -- Pharyngeal- Thin Teaspoon -- Pharyngeal -- Pharyngeal- Thin Cup Delayed swallow initiation-vallecula;Delayed swallow initiation-pyriform sinuses Pharyngeal -- Pharyngeal- Thin Straw Delayed swallow initiation-vallecula;Delayed swallow initiation-pyriform sinuses Pharyngeal -- Pharyngeal- Puree -- Pharyngeal -- Pharyngeal- Mechanical Soft -- Pharyngeal -- Pharyngeal- Regular -- Pharyngeal -- Pharyngeal- Multi-consistency -- Pharyngeal -- Pharyngeal- Pill -- Pharyngeal -- Pharyngeal Comment --  CHL IP CERVICAL ESOPHAGEAL  PHASE 11/20/2020 Cervical Esophageal Phase WFL Pudding Teaspoon -- Pudding Cup -- Honey Teaspoon -- Honey Cup -- Nectar Teaspoon -- Nectar Cup -- Nectar Straw -- Thin Teaspoon -- Thin Cup -- Thin Straw -- Puree -- Mechanical Soft -- Regular -- Multi-consistency -- Pill -- Cervical Esophageal Comment -- Shanika I. Vear ClockPhillips, MS, CCC-SLP Acute Rehabilitation Services Office number [phone] Pager [phone] Scheryl MartenShanika I Phillips 11/20/2020, 3:42 PM              EEG adult  Result Date: 11/14/2020 Charlsie QuestYadav, Priyanka O, MD     11/14/2020 12:09 PM Patient Name: Ephriam JenkinsBradley Strzelecki MRN: [id number] Epilepsy Attending: Charlsie QuestPriyanka O Yadav Referring Physician/Provider: Dr. Levon Hedgeraniel Smith Date: 11/14/2020 Duration: 23.57 mins Patient history: 45 year old male status post cardio-respiratory arrest.  EEG to evaluate for seizures. Level of alertness: lethargic AEDs during EEG study: None Technical aspects: This EEG study was done with scalp electrodes positioned according to the 10-20 International system of electrode placement. Electrical activity was acquired at a sampling rate of 500Hz  and reviewed with a high frequency filter of 70Hz  and a low frequency filter of 1Hz . EEG data were recorded continuously and digitally stored. Description: EEG showed continuous generalized polymorphic mixed frequencies with predominantly 6 to 9 Hz theta-alpha activity. Intermittent generalized 2 to 3 Hz delta slowing as well as frontocentral 15 to 18 Hz beta activity was also noted. Hyperventilation and photic stimulation were not performed.   ABNORMALITY -Continuous slow, generalized IMPRESSION: This study is suggestive of severe diffuse encephalopathy, nonspecific etiology.  No seizures or epileptiform discharges were seen throughout the recording. Charlsie QuestPriyanka O Yadav   EEG adult  Result Date: 11/08/2020 Charlsie QuestYadav, Priyanka O, MD     11/08/2020  6:22 PM Patient Name: Ephriam JenkinsBradley Falter MRN: [id number] Epilepsy Attending: Charlsie QuestPriyanka O Yadav Referring  Physician/Provider: Dr Delia HeadyPramod Sethi Date: 11/08/2020 Duration: 23.42 mins Patient history: 45yo M with R MCA infarct and ams. EEG to evaluate for seizure Level of alertness: Awake AEDs during EEG study: Clonazepam Technical aspects: This EEG study was done with scalp electrodes positioned according to the 10-20 International system of electrode placement. Electrical activity was acquired at a sampling rate of 500Hz  and reviewed with a high frequency filter of 70Hz  and a low frequency filter of 1Hz . EEG data were recorded continuously and digitally stored. Description: No posterior dominant rhythm was seen. EEG showed continuous 5-8Hz  theta-alpha activity in left hemisphere and low amplitude 2-3h in right hemisphere. Hyperventilation and photic stimulation were not performed.   ABNORMALITY -  Continuous slow, generalized and lateralized right hemisphere IMPRESSION: This study is suggestive of cortical dysfunction arising from right hemisphere likely secondary to underlying structural abnormality/stroke. Additionally, there is moderate diffuse encephalopathy, nonspecific etiology. No seizures or epileptiform discharges were seen throughout the recording. Priyanka Annabelle Harman   [place] LT UPPER EXTREM LTD SOFT TISSUE NON VASCULAR  Result Date: 11/13/2020 CLINICAL DATA:  Pain and swelling in the antecubital fossa. Evaluate for possible abscess. EXAM: ULTRASOUND left UPPER EXTREMITY LIMITED TECHNIQUE: Ultrasound examination of the upper extremity soft tissues was performed in the area of clinical concern. COMPARISON:  None. FINDINGS: There is moderate inflammation/edema in the antecubital fossa but no discrete fluid collection to suggest a drainable abscess. IMPRESSION: Inflammatory changes with moderate edema in the soft tissues but no discrete abscess. Electronically Signed   By: Rudie Meyer M.D.   On: 11/13/2020 08:10   ECHO INTRAOPERATIVE TEE  Result Date: 11/05/2020  *INTRAOPERATIVE TRANSESOPHAGEAL REPORT *  Patient  Name:   DELONTE MUSICH Date of Exam: 11/04/2020 Medical Rec #:  [id number]     Height: Accession #:    [id number]    Weight:       145.5 lb Date of Birth:  1975-10-03    BSA:          1.63 m Patient Age:    44 years      BP:           163/100 mmHg Patient Gender: M             HR:           70 bpm. Exam Location:  Inpatient Transesophogeal exam was perform intraoperatively during surgical procedure. Patient was closely monitored under general anesthesia during the entirety of examination. Indications:     Aortic dissection Performing Phys: [id number] CLARENCE H OWEN Diagnosing Phys: Lewie Loron MD Complications: No known complications during this procedure. POST-OP IMPRESSIONS - Left Ventricle: The left ventricle is unchanged from pre-bypass. - Right Ventricle: The right ventricle appears unchanged from pre-bypass. - Aorta: there is no dissection present in the aorta A graft was placed in the ascending aorta for repair. - Left Atrial Appendage: The left atrial appendage appears unchanged from pre-bypass. - Aortic Valve: The aortic valve appears unchanged from pre-bypass. - Mitral Valve: The mitral valve appears unchanged from pre-bypass. - Tricuspid Valve: The tricuspid valve appears unchanged from pre-bypass. PRE-OP FINDINGS  Left Ventricle: The left ventricle has normal systolic function, with an ejection fraction of 55-60%. The cavity size was normal. There is severe concentric left ventricular hypertrophy. Right Ventricle: The right ventricle has normal systolic function. The cavity was normal. There is no increase in right ventricular wall thickness. Left Atrium: Left atrial size was normal in size. No left atrial/left atrial appendage thrombus was detected. Right Atrium: Right atrial size was normal in size. Interatrial Septum: The interatrial septum was not assessed. There is no evidence of a patent foramen ovale. Pericardium: Trivial pericardial effusion is present. Mitral Valve: The mitral valve is normal in  structure. Mitral valve regurgitation is trivial by color flow Doppler. Tricuspid Valve: The tricuspid valve was normal in structure. Tricuspid valve regurgitation is trivial by color flow Doppler. Aortic Valve: The aortic valve is tricuspid Aortic valve regurgitation is trivial by color flow Doppler. There is no stenosis of the aortic valve. Pulmonic Valve: The pulmonic valve was normal in structure. Pulmonic valve regurgitation is not visualized by color flow Doppler. Aorta: There is evidence of a dissection in the aortic root,  ascending aorta and aortic arch. Shunts: There is no evidence of an atrial septal defect.  Lewie Loron MD Electronically signed by Lewie Loron MD Signature Date/Time: 11/05/2020/2:44:37 AM    Final    CT Angio Chest/Abd/Pel for Dissection W and/or W/WO  Result Date: 11/04/2020 CLINICAL DATA:  Further evaluation of aortic dissection seen on prior same day imaging EXAM: CT ANGIOGRAPHY CHEST, ABDOMEN AND PELVIS TECHNIQUE: Non-contrast CT of the chest was initially obtained. Multidetector CT imaging through the chest, abdomen and pelvis was performed using the standard protocol during bolus administration of intravenous contrast. Multiplanar reconstructed images and MIPs were obtained and reviewed to evaluate the vascular anatomy. CONTRAST:  OMNIPAQUE IOHEXOL 350 MG/ML SOLN COMPARISON:  Same day CT neck. FINDINGS: CTA CHEST FINDINGS Cardiovascular: Ascending aortic dissection extending from superior to the sino-tubular junction to the transverse arch with continuation of the dissection into the innominate artery with thrombus in the false lumen of the innominate artery. No involvement of the descending thoracic aorta. The left main and right coronary arteries not appear to be involved and are patent as are the portions of the LAD and left circumflex. Normal size heart. No significant pericardial effusion/thickening. No central pulmonary embolus. Mediastinum/Nodes: No mediastinal  fluid collection. No discrete thyroid nodule. No pathologically enlarged mediastinal, hilar or axillary lymph nodes. The trachea and esophagus are grossly unremarkable. Lungs/Pleura: No suspicious pulmonary nodules or masses. No focal consolidation. No pleural effusion. No pneumothorax. Musculoskeletal: No acute osseous abnormality. Review of the MIP images confirms the above findings. CTA ABDOMEN AND PELVIS FINDINGS VASCULAR Aorta: Aortic atherosclerosis. Normal caliber abdominal aorta without aneurysm, dissection, vasculitis or significant stenosis. Celiac: Patent without evidence of aneurysm, dissection, vasculitis or significant stenosis. SMA: Patent without evidence of aneurysm, dissection, vasculitis or significant stenosis. Renals: Both renal arteries are patent without evidence of aneurysm, dissection, vasculitis, fibromuscular dysplasia or significant stenosis. IMA: Patent without evidence of aneurysm, dissection, vasculitis or significant stenosis. Inflow: Patent without evidence of aneurysm, dissection, vasculitis or significant stenosis. Veins: No obvious venous abnormality within the limitations of this arterial phase study. Review of the MIP images confirms the above findings. NON-VASCULAR Hepatobiliary: No suspicious hepatic lesion. Gallbladder is unremarkable. No biliary ductal dilation. Pancreas: Within normal limits. Spleen: Within normal limits. Adrenals/Urinary Tract: Adrenal glands are unremarkable. Kidneys are normal, without renal calculi, solid enhancing lesion, or hydronephrosis. Symmetric renal enhancement. Bladder is unremarkable. Stomach/Bowel: Stomach is within normal limits. Appendix appears normal. No evidence of bowel wall thickening, distention, or inflammatory changes. Lymphatic: No pathologically enlarged abdominal or pelvic lymph nodes. Reproductive: Prostate is unremarkable. Other: No abdominopelvic ascites.  No pneumoperitoneum. Musculoskeletal: Mild multilevel degenerative  change of the spine. No acute osseous abnormality Review of the MIP images confirms the above findings. IMPRESSION: 1. 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 No involvement of the descending thoracic aorta. No pericardial effusion or mediastinal fluid. 2. No acute findings within the abdomen or pelvis. 3.  Aortic Atherosclerosis (ICD10-I70.0). Electronically Signed   By: Maudry Mayhew MD   On: 11/04/2020 20:22   CT HEAD CODE STROKE WO CONTRAST  Result Date: 11/04/2020 CLINICAL DATA:  Code stroke.  Aphasia EXAM: CT HEAD WITHOUT CONTRAST TECHNIQUE: Contiguous axial images were obtained from the base of the skull through the vertex without intravenous contrast. COMPARISON:  None. FINDINGS: Brain: There  is no acute intracranial hemorrhage or mass effect. Question small subtle loss of gray-white differentiation in the right insular region. Ventricles and sulci are normal in size and configuration. Vascular: Question of hyperdensity along distal right M2 or proximal M3 MCA branch in the posterior sylvian fissure. Skull: Unremarkable. Sinuses/Orbits: No acute abnormality. Other: Mastoid air cells are clear. ASPECTS [facility] Stroke Program Early CT Score) - Ganglionic level infarction (caudate, lentiform nuclei, internal capsule, insula, M1-M3 cortex): 6 - Supraganglionic infarction (M4-M6 cortex): 3 Total score (0-10 with 10 being normal): 9 IMPRESSION: No acute intracranial hemorrhage. Question subtle small loss of gray-white differentiation in the right insular region (ASPECT score is 9). Possible hyperdense distal right M2 or proximal M3 MCA branch in the posterior sylvian fissure. These results were communicated to Dr. Iver Nestle at 6:45 pm on 11/04/2020 by text page via the [facility] messaging system. Electronically  Signed   By: Guadlupe Spanish M.D.   On: 11/04/2020 18:59   VAS [place] UPPER EXTREMITY VENOUS DUPLEX  Result Date: 11/11/2020 UPPER VENOUS STUDY  Patient Name:  RAYSHAWN MANEY  Date of Exam:   11/11/2020 Medical Rec #: [id number]      Accession #:    [id number] Date of Birth: 1975-10-22     Patient Gender: M Patient Age:   9Y Exam Location:  [facility] Procedure:      VAS [place] UPPER EXTREMITY VENOUS DUPLEX Referring Phys: [id number] Steffanie Dunn --------------------------------------------------------------------------------  Indications: Septic thrombophlebitis Limitations: Patient in restraints, constant movement, unable/unwilling to reposition arm. Comparison Study: No prior studies. Performing Technologist: Jean Rosenthal RDMS,RVT  Examination Guidelines: A complete evaluation includes B-mode imaging, spectral Doppler, color Doppler, and power Doppler as needed of all accessible portions of each vessel. Bilateral testing is considered an integral part of a complete examination. Limited examinations for reoccurring indications may be performed as noted.  Left Findings: +--------+------------+---------+-----------+----------+-------+ LEFT    CompressiblePhasicitySpontaneousPropertiesSummary +--------+------------+---------+-----------+----------+-------+ Brachial    Full                                          +--------+------------+---------+-----------+----------+-------+ Radial      Full                                          +--------+------------+---------+-----------+----------+-------+ Ulnar       Full                                          +--------+------------+---------+-----------+----------+-------+ Cephalic  Partial                                  Acute  +--------+------------+---------+-----------+----------+-------+ Basilic   Partial                Yes               Acute  +--------+------------+---------+-----------+----------+-------+  Summary:   Left: Findings consistent with acute superficial vein thrombosis involving the left basilic vein and left cephalic vein.  *See table(s) above for measurements and observations.  Diagnosing physician: Waverly Ferrari MD Electronically signed by Waverly Ferrari MD on 11/11/2020 at 5:13:28 PM.    Final  ECHOCARDIOGRAM LIMITED  Result Date: 11/14/2020    ECHOCARDIOGRAM REPORT   Patient Name:   CASSEY HURRELL Date of Exam: 11/14/2020 Medical Rec #:  [id number]     Height:       74.0 in Accession #:    [id number]    Weight:       127.6 lb Date of Birth:  08-06-1975    BSA:          1.796 m Patient Age:    44 years      BP:           111/78 mmHg Patient Gender: M             HR:           74 bpm. Exam Location:  Inpatient Procedure: Limited Echo, Cardiac Doppler and Color Doppler STAT ECHO REPORT CONTAINS CRITICAL RESULT Indications:    Cardiac tamponade  History:        Patient has prior history of Echocardiogram examinations. CAD,                 Abnormal ECG, Aortic Dissection, Signs/Symptoms:Dyspnea,                 Shortness of Breath and Chest Pain; Risk Factors:Hypertension.                 S/P aortic dissection repair. Polysubstance abuse.  Sonographer:    Sheralyn Boatman RDCS Referring Phys: [id number] Lorin Glass  Sonographer Comments: Technically difficult study due to poor echo windows and echo performed with patient supine and on artificial respirator. Pectus excavatum. IMPRESSIONS  1. Limited study; not all views obtained and doppler incomplete; normal LV function; small pericardial effusion (most prominent in posterior distribution); no evidence of tamponade.  2. Left ventricular ejection fraction, by estimation, is 55 to 60%. The left ventricle has normal function. The left ventricle has no regional wall motion abnormalities. Left ventricular diastolic function could not be evaluated.  3. Right ventricular systolic function is normal. The right ventricular size is normal.  4. A small pericardial  effusion is present. The pericardial effusion is posterior to the left ventricle.  5. The mitral valve is normal in structure. No evidence of mitral valve regurgitation. No evidence of mitral stenosis.  6. The aortic valve is tricuspid. Aortic valve regurgitation is trivial.  7. The inferior vena cava is dilated in size with >50% respiratory variability, suggesting right atrial pressure of 8 mmHg. FINDINGS  Left Ventricle: Left ventricular ejection fraction, by estimation, is 55 to 60%. The left ventricle has normal function. The left ventricle has no regional wall motion abnormalities. The left ventricular internal cavity size was normal in size. There is  no left ventricular hypertrophy. Left ventricular diastolic function could not be evaluated. Right Ventricle: The right ventricular size is normal. Right ventricular systolic function is normal. Left Atrium: Left atrial size was normal in size. Right Atrium: Right atrial size was normal in size. Pericardium: A small pericardial effusion is present. The pericardial effusion is posterior to the left ventricle. Mitral Valve: The mitral valve is normal in structure. No evidence of mitral valve regurgitation. No evidence of mitral valve stenosis. Tricuspid Valve: The tricuspid valve is normal in structure. Tricuspid valve regurgitation is trivial. No evidence of tricuspid stenosis. Aortic Valve: The aortic valve is tricuspid. Aortic valve regurgitation is trivial. Pulmonic Valve: The pulmonic valve was normal in structure. Pulmonic valve regurgitation is not visualized. No evidence of pulmonic stenosis. Aorta: The  aortic root is normal in size and structure. Venous: The inferior vena cava is dilated in size with greater than 50% respiratory variability, suggesting right atrial pressure of 8 mmHg. IAS/Shunts: The interatrial septum was not assessed. Additional Comments: Limited study; not all views obtained and doppler incomplete; normal LV function; small pericardial  effusion (most prominent in posterior distribution); no evidence of tamponade.  IVC IVC diam: 2.70 cm  AORTA Ao Asc diam: 3.20 cm Olga Millers MD Electronically signed by Olga Millers MD Signature Date/Time: 11/14/2020/3:13:14 PM    Final    [place] EKG SITE RITE  Result Date: 11/22/2020 If Site Rite image not attached, placement could not be confirmed due to current cardiac rhythm.  CT ANGIO HEAD NECK W WO CM (CODE STROKE)  Result Date: 11/04/2020 CLINICAL DATA:  Code stroke EXAM: CT ANGIOGRAPHY HEAD AND NECK TECHNIQUE: Multidetector CT imaging of the head and neck was performed using the standard protocol during bolus administration of intravenous contrast. Multiplanar CT image reconstructions and MIPs were obtained to evaluate the vascular anatomy. Carotid stenosis measurements (when applicable) are obtained utilizing NASCET criteria, using the distal internal carotid diameter as the denominator. CONTRAST:  75mL OMNIPAQUE IOHEXOL 350 MG/ML SOLN COMPARISON:  None. FINDINGS: CTA NECK Aortic arch: A dissection flap is present within the included ascending aorta extending into the transverse portion of the arch but not the descending portion. Dissection involves the innominate artery with thrombosis of the false lumen. This continues into the right common carotid. Left common carotid and left subclavian artery origins are patent. Right carotid system: As noted above, there is extension of dissection into the common carotid with occlusion of the false lumen resulting in narrowing of the true lumen. Minimal diameter of 1.5 mm. The external carotid origin is patent. There is minimal contrast enhancement at the ICA origin and subsequent minimal enhancement along portions of the cervical ICA. Left carotid system: Patent. Trace calcified plaque along the proximal ICA. No stenosis. Vertebral arteries: Patent.  Right vertebral artery is dominant. Skeleton: Degenerative changes of the cervical spine. Other neck:  Unremarkable. Upper chest: Included upper lungs are clear. Review of the MIP images confirms the above findings CTA HEAD Anterior circulation: Possible faint enhancement of the intracranial right internal carotid artery. There is reconstitution at the terminus. Right M1 and proximal M2 MCA are patent. There is occlusion of a small right distal M2 and proximal M3 MCA branch corresponding to abnormality on noncontrast CT. Left middle and both anterior cerebral arteries are patent. Anterior communicating artery is present. Posterior circulation: Intracranial vertebral arteries are patent. The left vertebral artery becomes small in caliber after PICA origin. Basilar artery is patent. Major cerebellar artery origins are patent. Posterior cerebral arteries are patent there is a left posterior communicating artery. Venous sinuses: Patent as allowed by contrast bolus timing. 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 This extends into the right common carotid with narrowing of the true lumen to a minimal diameter of 1.5 mm. Minimal enhancement within the right cervical and intracranial ICA. Reconstitution at the right ICA terminus. No proximal intracranial vessel occlusion. 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 These results were called by telephone at the time of interpretation on 11/04/2020 at 6:59 pm to provider [facility] , who  verbally acknowledged these results. Electronically Signed   By: Guadlupe Spanish M.D.   On: 11/04/2020 19:10      Subjective:no new complaints.   Discharge Exam: Vitals:   11/27/20 0807 11/27/20 1102  BP: 136/90 127/83  Pulse: 95 (!) 101  Resp: 16 16  Temp: 98.8 F (37.1 C) 99.1 F (37.3 C)  SpO2:  100% 98%   Vitals:   11/26/20 2337 11/27/20 0341 11/27/20 0807 11/27/20 1102  BP: 117/87 133/79 136/90 127/83  Pulse: 93 97 95 (!) 101  Resp: Temp: 98.4 F (36.9 C) 98.1 F (36.7 C) 98.8 F (37.1 C) 99.1 F (37.3 C)  TempSrc: Oral Oral Oral Oral  SpO2: 97% 100% 100% 98%  Weight:  62.7 kg    Height:        General: Pt is alert, awake, not in acute distress Cardiovascular: RRR, S1/S2 +, no rubs, no gallops Respiratory: CTA bilaterally, no wheezing, no rhonchi Abdominal: Soft, NT, ND, bowel sounds + Extremities: no edema, no cyanosis    The results of significant diagnostics from this hospitalization (including imaging, microbiology, ancillary and laboratory) are listed below for reference.     Microbiology: Recent Results (from the past 240 hour(s))  SARS CORONAVIRUS 2 (TAT 6-24 HRS) Nasopharyngeal Nasopharyngeal Swab     Status: Abnormal   Collection Time: 11/18/20  4:40 PM   Specimen: Nasopharyngeal Swab  Result Value Ref Range Status   SARS Coronavirus 2 POSITIVE (A) NEGATIVE Final    Comment: (NOTE) SARS-CoV-2 target nucleic acids are DETECTED.  The SARS-CoV-2 RNA is generally detectable in upper and lower respiratory specimens during the acute phase of infection. Positive results are indicative of the presence of SARS-CoV-2 RNA. Clinical correlation with patient history and other diagnostic information is  necessary to determine patient infection status. Positive results do not rule out bacterial infection or co-infection with other viruses.  The expected result is Negative.  Fact Sheet for Patients: HairSlick.no  Fact Sheet for Healthcare Providers: quierodirigir.com  This test is not yet approved or cleared by the [place] FDA and  has been authorized for detection and/or diagnosis of SARS-CoV-2 by FDA under an Emergency Use Authorization (EUA). This EUA will remain  in effect (meaning  this test can be used) for the duration of the COVID-19 declaration under Section 564(b)(1) of the Act, 21 U. S.C. section 360bbb-3(b)(1), unless the authorization is terminated or revoked sooner.   Performed at [facility], [address] [place], [place] [address]      Labs: BNP (last 3 results) Recent Labs    11/14/20 0854  BNP 353.0*   Basic Metabolic Panel: Recent Labs  Lab 11/22/20 1644 11/23/20 0923 11/24/20 0535 11/26/20 0500  NA 139 135 135 135  K 4.1 4.5 4.1 3.9  CL 107 106 105 101  CO2 23 21* 21* 26  GLUCOSE 80 133* 79 105*  BUN 53* 44* 31* 31*  CREATININE 1.54* 1.40* 1.13 0.96  CALCIUM 8.7* 8.5* 8.2* 8.4*   Liver Function Tests: Recent Labs  Lab 11/23/20 0923  AST 32  ALT 78*  ALKPHOS 107  BILITOT 1.0  PROT 6.2*  ALBUMIN 2.9*   No results for input(s): LIPASE, AMYLASE in the last 168 hours. No results for input(s): AMMONIA in the last 168 hours. CBC: Recent Labs  Lab 11/24/20 0535 11/26/20 0500  WBC 6.3 6.7  NEUTROABS 4.2  --   HGB 10.4* 11.3*  HCT 31.7* 33.7*  MCV  99.4 98.0  PLT 332 395   Cardiac Enzymes: No results for input(s): CKTOTAL, CKMB, CKMBINDEX, TROPONINI in the last 168 hours. BNP: Invalid input(s): POCBNP CBG: Recent Labs  Lab 11/26/20 1641 11/26/20 1919 11/26/20 2346 11/27/20 0338 11/27/20 1118  GLUCAP 97 147* 115* 135* 101*   D-Dimer No results for input(s): DDIMER in the last 72 hours. Hgb A1c No results for input(s): HGBA1C in the last 72 hours. Lipid Profile No results for input(s): CHOL, HDL, LDLCALC, TRIG, CHOLHDL, LDLDIRECT in the last 72 hours. Thyroid function studies No results for input(s): TSH, T4TOTAL, T3FREE, THYROIDAB in the last 72 hours.  Invalid input(s): FREET3 Anemia work up No results for input(s): VITAMINB12, FOLATE, FERRITIN, TIBC, IRON, RETICCTPCT in the last 72 hours. Urinalysis No results found for: COLORURINE, APPEARANCEUR, LABSPEC, PHURINE, GLUCOSEU, HGBUR, BILIRUBINUR,  KETONESUR, PROTEINUR, UROBILINOGEN, NITRITE, LEUKOCYTESUR Sepsis Labs Invalid input(s): PROCALCITONIN,  WBC,  LACTICIDVEN Microbiology Recent Results (from the past 240 hour(s))  SARS CORONAVIRUS 2 (TAT 6-24 HRS) Nasopharyngeal Nasopharyngeal Swab     Status: Abnormal   Collection Time: 11/18/20  4:40 PM   Specimen: Nasopharyngeal Swab  Result Value Ref Range Status   SARS Coronavirus 2 POSITIVE (A) NEGATIVE Final    Comment: (NOTE) SARS-CoV-2 target nucleic acids are DETECTED.  The SARS-CoV-2 RNA is generally detectable in upper and lower respiratory specimens during the acute phase of infection. Positive results are indicative of the presence of SARS-CoV-2 RNA. Clinical correlation with patient history and other diagnostic information is  necessary to determine patient infection status. Positive results do not rule out bacterial infection or co-infection with other viruses.  The expected result is Negative.  Fact Sheet for Patients: HairSlick.no  Fact Sheet for Healthcare Providers: quierodirigir.com  This test is not yet approved or cleared by the [place] FDA and  has been authorized for detection and/or diagnosis of SARS-CoV-2 by FDA under an Emergency Use Authorization (EUA). This EUA will remain  in effect (meaning this test can be used) for the duration of the COVID-19 declaration under Section 564(b)(1) of the Act, 21 U. S.C. section 360bbb-3(b)(1), unless the authorization is terminated or revoked sooner.   Performed at [facility], [address], [place], [place] [address]      Time coordinating discharge: 36 minutes.  SIGNED:   Kathlen Mody, MD  [facility] 11/27/2020, 1:32 PM

## 2020-11-27 NOTE — H&P (Signed)
Physical Medicine and Rehabilitation Admission H&P    CC: stroke with functional deficits   HPI: Jesse [name] is a 45 year old male with history of HTN, depression, anxiety who was originally admitted to [facility] on 11/04/2020 with aphasia, blurry vision in right eye and mental status changes.  He was found to have aortic dissection extending into the right common carotid with narrowing of the true lumen as well as occlusion of small distal right M2 and proximal M3 MCA branch.  He underwent emergent repair of ascending thoracic aortic dissection with resuspension of native aortic valve by Dr. Cornelius Moraswen on the same day.  Follow-up CT of head showed relatively large infarct with cytotoxic edema in posterior right MCA and MCA/PCA watershed territories.  Follow-up CT on 07/20 showed interval development of petechial hemorrhage with evolving posterior right MCA and PCA MCA infarct.  He was found to have superficial thrombosis of left antecubital vein which was treated with IV heparin as well as low-dose ASA.  Hospital course was significant for issues with severe agitation due to delirium as well as poor safety awareness with impulsivity.  Therapy evaluations were completed and CIR was recommended due to functional decline.  He was admitted to CIR on 11/13/2020 and therapy evaluations completed however on 07/22 he was noted to be hypotensive and tachycardic with agonal breathing.  CODE BLUE initiated and he was briefly intubated and started on low-dose levo.  Respiratory arrest felt to be due to aspiration pneumonia and he was started on Rocephin.  He was also noted to be COVID positive and was started on remdesivir, steroids as well as baricitinib.  Follow-up CT showed acute infarct with progressive gyral density with question of hemorrhagic transformation.  He tolerated extubation without difficulty on 07/23 and neurology felt patient with evolution of stroke.  Repeat CT of head of serial CT of head done for  follow-up to repeat CT of 07/29 showed evolving right MCA territory infarct with decreasing edema and mass-effect and new acute or subacute small bilateral cerebellar infarcts.  Cortak placed due to poor p.o. intake and for nutritional support secondary to severe malnutrition.  Swallow evaluation done revealing oropharyngeal dysphagia with prolonged mastication and he was started on dysphagia 1 with thin liquids.  He continues to have issues with dysphagia.  He has poor attention, expressive/receptive deficits, cognitive deficits, weakness, unsteady/staggering gait mobility as well as ADLs.  CIR recommended due to functional decline.    Review of Systems  Unable to perform ROS: Language      Past Medical History:  Diagnosis Date   Anxiety    Depression    Essential hypertension    S/P aortic dissection repair 11/05/2020   Straight graft replacement of ascending aorta and proximal transverse aortic arch with re-suspension of native aortic valve and open hemi arch distal anastomosis with aorta to right common carotid bypass and aorta to right subclavian bypass    Past Surgical History:  Procedure Laterality Date   ASCENDING AORTIC ROOT REPLACEMENT N/A 11/04/2020   Procedure: 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 WITH REPLACEMENT OF ASCENDING AORTA AND HEMIARCH USING HEMASHIELD PLATINUM 26MM GRAFT AND HEMASHIELD GOLD 14 X 8MM GRAFT, RESUSPENSION OF NATIVE VALVE, AORTA TO RIGHT CAROTID BYPASS, AORTA TO RIGHT SUBCLAVIAN BYPASS;  Surgeon: Purcell Nailswen, Clarence H, MD;  Location: [facility] OR;  Service: Open Heart Surgery;  Laterality: N/A;   TEE WITHOUT CARDIOVERSION N/A 11/04/2020   Procedure: TRANSESOPHAGEAL ECHOCARDIOGRAM (TEE);  Surgeon: Purcell Nailswen, Clarence H, MD;  Location:  [facility] OR;  Service: Open Heart Surgery;  Laterality: N/A;    Family History: Unable to elicit due to aphasia.    Social History:  Per reports that he has been smoking cigarettes. He has never used smokeless tobacco. Per reports current  alcohol use of about 2.0 standard drinks of alcohol per week. Per reports previous drug use.   Allergies: No Known Allergies   Medications Prior to Admission  Medication Sig Dispense Refill   amLODipine (NORVASC) 10 MG tablet Take 1 tablet (10 mg total) by mouth daily.     ascorbic acid (VITAMIN C) 500 MG tablet Take 1 tablet (500 mg total) by mouth daily.     aspirin 81 MG chewable tablet Chew 1 tablet (81 mg total) by mouth daily.     carvedilol (COREG) 3.125 MG tablet Take 1 tablet (3.125 mg total) by mouth 2 (two) times daily with a meal.     ferrous sulfate 325 (65 FE) MG tablet Take 1 tablet (325 mg total) by mouth daily. 30 tablet 3   hydrALAZINE (APRESOLINE) 50 MG tablet Take 1 tablet (50 mg total) by mouth every 8 (eight) hours.     insulin aspart (NOVOLOG) 100 UNIT/ML injection Moderate (average weight, post-op) CBG < 70: Implement Hypoglycemia Standing Orders and refer to Hypoglycemia Standing Orders sidebar report CBG 70 - 120: 0 units CBG 121 - 150: 2 units CBG 151 - 200: 3 units CBG 201 - 250: 5 units CBG 251 - 300: 8 units CBG 301 - 350: 11 units CBG 351 - 400: 15 units CBG > 400: call MD and obtain STAT lab verification 10 mL 11   melatonin 5 MG TABS Place 1 tablet (5 mg total) into feeding tube at bedtime as needed.  0   Nutritional Supplements (FEEDING SUPPLEMENT, OSMOLITE 1.5 CAL,) LIQD Place 1,200 mLs into feeding tube daily. 75 mL 0   Nutritional Supplements (FEEDING SUPPLEMENT, PROSOURCE TF,) liquid Place 45 mLs into feeding tube 4 (four) times daily.     pantoprazole sodium (PROTONIX) 40 mg/20 mL PACK Take 20 mLs (40 mg total) by mouth daily. 30 mL    polyethylene glycol (MIRALAX / GLYCOLAX) 17 g packet Take 17 g by mouth daily as needed for moderate constipation. 14 each 0   QUEtiapine (SEROQUEL) 50 MG tablet Take 1 tablet (50 mg total) by mouth at bedtime.     rosuvastatin (CRESTOR) 20 MG tablet Take 1 tablet (20 mg total) by mouth daily. 30 tablet 11   zinc sulfate 220  (50 Zn) MG capsule Take 1 capsule (220 mg total) by mouth daily.      Drug Regimen Review  Drug regimen was reviewed and remains appropriate with no significant issues identified  Home: Home Living Family/patient expects to be discharged to:: Private residence Living Arrangements: Children, Spouse/significant other Available Help at Discharge: Family, Available 24 hours/day Type of Home: House Home Access: Stairs to enter [employer] of Steps: 2 Entrance Stairs-Rails: None Home Layout: Able to live on main level with bedroom/bathroom Bathroom Shower/Tub: Engineer, manufacturing systems: Standard Bathroom Accessibility: Yes Additional Comments: pt non-verbal; information from CIR charting  Lives With: Spouse   Functional History: Prior Function Level of Independence: Independent Comments: prior to initial admission 7/12   Functional Status:  Mobility: Bed Mobility Overal bed mobility: Needs Assistance Bed Mobility: Supine to Sit Rolling: Mod assist Supine to sit: Supervision, HOB elevated Sit to supine: Min assist General bed mobility comments: Supervision with improved ability to initiate with  verbal cues only Transfers Overall transfer level: Needs assistance Equipment used: None Transfers: Sit to/from Stand Sit to Stand: Min guard General transfer comment: min guard for balance, no assist to powerup Ambulation/Gait Ambulation/Gait assistance: Min assist Gait Distance (Feet): 150 Feet Assistive device: 1 person hand held assist Gait Pattern/deviations: Step-through pattern, Staggering right, Staggering left General Gait Details: Patient ambulated out in hallway with single hand held assist. Reaches for rail in hallway on occasion. Unsteady with ambulation. Gait velocity: slightly decreased Gait velocity interpretation: <1.8 ft/sec, indicate of risk for recurrent falls   ADL: ADL Overall ADL's : Needs assistance/impaired Eating/Feeding: NPO Grooming:  Minimal assistance, Standing, Wash/dry face, Brushing hair Grooming Details (indicate cue type and reason): Min A overall for basic grooming tasks standing at sink, cues (verbal and pointing to area to wash face) to complete task. Min A to brush hair for thoroughness and cues to look in mirror to correct. assist to brush L side of hair thoroughly Lower Body Dressing: Sitting/lateral leans, Moderate assistance Lower Body Dressing Details (indicate cue type and reason): Mod A to don socks. assist to doff socks and assist to initiate in donning clean socks. Placed sock around toes with pt able to follow directions to don sock completely. tactile cued to cross LE to don other sock without assist Toilet Transfer: Minimal assistance, Ambulation, BSC, Regular Toilet, RW Toilet Transfer Details (indicate cue type and reason): Pt overall min guard with use of RW though had R hand on RW and L hand on therapist's shoulders. able to stand without assist from toilet [facility] over regular toilet due to tall stature). On exit of bathroom, trialed without AD with unsteadiness noted with only one hand on therapist's shoulder. Cues to avoid crossing feet and taking big steps Functional mobility during ADLs: Minimal assistance, Cueing for sequencing, Cueing for safety General ADL Comments: Improving sequencing with initiation cues. Questionable inattention to L side as pt bumping into furniture when ambulating. Continued assist for balance, cognition for ADLs   Cognition: Cognition Overall Cognitive Status: Difficult to assess Arousal/Alertness: Awake/alert Orientation Level: Disoriented X4 Attention: Sustained Sustained Attention: Impaired Sustained Attention Impairment: Verbal basic, Functional basic Memory:  (DTA) Awareness: Impaired Problem Solving: Impaired Problem Solving Impairment: Verbal basic, Functional basic Cognition Arousal/Alertness: Awake/alert Behavior During Therapy: WFL for tasks  assessed/performed Overall Cognitive Status: Difficult to assess Area of Impairment: Safety/judgement, Problem solving Current Attention Level: Sustained Following Commands: Follows one step commands consistently Safety/Judgement: Decreased awareness of safety, Decreased awareness of deficits Awareness: Intellectual Problem Solving: Requires verbal cues, Requires tactile cues General Comments: Pt with improved ability to verbalize appropriate sentences though aphasia still noted. Pt with improved ability to follow one step directions > 75% of the time though will benefit from assist in initiating some tasks Difficult to assess due to: Impaired communication   Blood pressure (!) 129/92, pulse 93, temperature 98.2 F (36.8 C), temperature source Oral, resp. rate 18, height 6\' 2"  (1.88 m), weight 62.7 kg, SpO2 99 %. Physical Exam Constitutional:      General: He is awake.     Appearance: He is cachectic.     HEENT:  Cortak in place  Cardio: Reg rate Chest: normal effort, normal rate of breathing Abd: soft, non-distended Ext: no edema Psych: pleasant, normal affect Skin: Multiple healing abrasions bilateral forearms. PICC RUE.  Stage 1 pressure injury to back. Neuro: Pleasant and interactive, respectful. Has fluent aphasia --unable to select name or place with choice of two. Unable to point. Moves all  four with verbal/tactile cues.  Mild left sided inattention  Results for orders placed or performed during the hospital encounter of 11/14/20 (from the past 48 hour(s))  Glucose, capillary     Status: Abnormal   Collection Time: 11/25/20  8:39 PM  Result Value Ref Range   Glucose-Capillary 161 (H) 70 - 99 mg/dL    Comment: Glucose reference range applies only to samples taken after fasting for at least 8 hours.   Comment 1 Notify RN    Comment 2 Document in Chart   Glucose, capillary     Status: Abnormal   Collection Time: 11/26/20 12:27 AM  Result Value Ref Range   Glucose-Capillary  108 (H) 70 - 99 mg/dL    Comment: Glucose reference range applies only to samples taken after fasting for at least 8 hours.  Glucose, capillary     Status: Abnormal   Collection Time: 11/26/20  3:36 AM  Result Value Ref Range   Glucose-Capillary 142 (H) 70 - 99 mg/dL    Comment: Glucose reference range applies only to samples taken after fasting for at least 8 hours.   Comment 1 Notify RN    Comment 2 Document in Chart   CBC     Status: Abnormal   Collection Time: 11/26/20  5:00 AM  Result Value Ref Range   WBC 6.7 4.0 - 10.5 K/uL   RBC 3.44 (L) 4.22 - 5.81 MIL/uL   Hemoglobin 11.3 (L) 13.0 - 17.0 g/dL   HCT 21.3 (L) 08.6 - 57.8 %   MCV 98.0 80.0 - 100.0 fL   MCH 32.8 26.0 - 34.0 pg   MCHC 33.5 30.0 - 36.0 g/dL   RDW 46.9 62.9 - 52.8 %   Platelets 395 150 - 400 K/uL   nRBC 0.0 0.0 - 0.2 %    Comment: Performed at [facility], [address], [place], [place] [address]  Basic metabolic panel     Status: Abnormal   Collection Time: 11/26/20  5:00 AM  Result Value Ref Range   Sodium 135 135 - 145 mmol/L   Potassium 3.9 3.5 - 5.1 mmol/L   Chloride 101 98 - 111 mmol/L   CO2 26 22 - 32 mmol/L   Glucose, Bld 105 (H) 70 - 99 mg/dL    Comment: Glucose reference range applies only to samples taken after fasting for at least 8 hours.   BUN 31 (H) 6 - 20 mg/dL   Creatinine, Ser 4.01 0.61 - 1.24 mg/dL   Calcium 8.4 (L) 8.9 - 10.3 mg/dL   GFR, Estimated >02 >72 mL/min    Comment: (NOTE) Calculated using the CKD-EPI Creatinine Equation (2021)    Anion gap 8 5 - 15    Comment: Performed at [facility], [address] [place], [place] [address]  Glucose, capillary     Status: Abnormal   Collection Time: 11/26/20  7:33 AM  Result Value Ref Range   Glucose-Capillary 133 (H) 70 - 99 mg/dL    Comment: Glucose reference range applies only to samples taken after fasting for at least 8 hours.  Glucose, capillary     Status: Abnormal   Collection Time: 11/26/20 11:04 AM  Result  Value Ref Range   Glucose-Capillary 117 (H) 70 - 99 mg/dL    Comment: Glucose reference range applies only to samples taken after fasting for at least 8 hours.  Glucose, capillary     Status: None   Collection Time: 11/26/20  4:41  PM  Result Value Ref Range   Glucose-Capillary 97 70 - 99 mg/dL    Comment: Glucose reference range applies only to samples taken after fasting for at least 8 hours.  Glucose, capillary     Status: Abnormal   Collection Time: 11/26/20  7:19 PM  Result Value Ref Range   Glucose-Capillary 147 (H) 70 - 99 mg/dL    Comment: Glucose reference range applies only to samples taken after fasting for at least 8 hours.  Glucose, capillary     Status: Abnormal   Collection Time: 11/26/20 11:46 PM  Result Value Ref Range   Glucose-Capillary 115 (H) 70 - 99 mg/dL    Comment: Glucose reference range applies only to samples taken after fasting for at least 8 hours.   Comment 1 Notify RN    Comment 2 Document in Chart   Glucose, capillary     Status: Abnormal   Collection Time: 11/27/20  3:38 AM  Result Value Ref Range   Glucose-Capillary 135 (H) 70 - 99 mg/dL    Comment: Glucose reference range applies only to samples taken after fasting for at least 8 hours.  Glucose, capillary     Status: Abnormal   Collection Time: 11/27/20 11:18 AM  Result Value Ref Range   Glucose-Capillary 101 (H) 70 - 99 mg/dL    Comment: Glucose reference range applies only to samples taken after fasting for at least 8 hours.   Comment 1 Notify RN    Comment 2 Document in Chart   Glucose, capillary     Status: Abnormal   Collection Time: 11/27/20  4:06 PM  Result Value Ref Range   Glucose-Capillary 173 (H) 70 - 99 mg/dL    Comment: Glucose reference range applies only to samples taken after fasting for at least 8 hours.   Comment 1 Notify RN    Comment 2 Document in Chart    No results found.     Medical Problem List and Plan: 1.  Right MCA CVA  -patient may shower  -ELOS/Goals: 7-10  days modI  -Admit to CIR 2.  Antithrombotics: -DVT/anticoagulation:  Pharmaceutical: Lovenox  -antiplatelet therapy: ASA 3. Pain Management:  Oxycodone 4. Mood: LCSW to follow for evaluation and support.   -antipsychotic agents: N/A 5. Neuropsych: This patient is not capable of making decisions on his own behalf. 6. Skin/Wound Care:  Routine pressure-relief measures.  -- Monitor wound for healing. 7. Fluids/Electrolytes/Nutrition: Monitor I's/O. 8.  COVID PNA: To continue baricitinib for 1 additional day 9.  HTN: Monitor blood pressures 3 times daily.  Continue Coreg and hydralazine. 10.  Dysphagia: Tolerating dysphagia 2, thin liquids without aspiration.  --Change tube feeds to nights for now  -- will start calorie count to monitor intake. 11.  Anxiety/agitation: Will continue ativan prn. D/c haldol--last used 07/31. Continue SSI while on tube feeds.   I have personally performed a face to face diagnostic evaluation, including, but not limited to relevant history and physical exam findings, of this patient and developed relevant assessment and plan.  Additionally, I have reviewed and concur with the physician assistant's documentation above.  Jacquelynn Cree, PA-C   Horton Chin, MD 11/27/2020

## 2020-11-27 NOTE — Progress Notes (Signed)
PMR Admission Coordinator Pre-Admission Assessment   Patient: Jesse [name] is an 45 y.o., male MRN: [id number] DOB: [birth year] Height: 6' 2" (188 cm) Weight: 62.7 kg   Insurance Information HMO:     PPO:      PCP:      IPA:      80/20:      OTHER: PRIMARY: Uninsured/medicaid pending      Policy#:       Subscriber: CM Name:       Phone#:      Fax#: Pre-Cert#:       Employer: Benefits:  Phone #:      Name: Eff. Date:      Deduct:       Out of Pocket Max:       Life Max: CIR:       SNF: Outpatient:      Co-Pay: Home Health:       Co-Pay: DME:      Co-Pay: Providers:  SECONDARY:       Policy#:      Phone#:   Development worker, community:       Phone#:   The Engineer, petroleum" for patients in Inpatient Rehabilitation Facilities with attached "Privacy Act Cable Records" was provided and verbally reviewed with: N/A   Emergency Contact Information Contact Information       Name Relation Home Work Mobile    [place] Spouse     [phone]    Jesse [name] Father     [phone]           Current Medical History  Patient Admitting Diagnosis: CVA, aortic dissection, post-covid   History of Present Illness: Jesse [name] is a 45 year old right-handed male with history of anxiety/depression with longstanding tobacco alcohol use.  Presented 11/05/2020 with acute onset of aphasia and blurred vision as well as altered mental status.  Admission chemistries unremarkable except sodium 133 glucose 112 creatinine 1.32, alcohol negative, WBC 12,700.  Cranial CT scan negative for acute changes.  Question subtle small loss of gray-white differentiation in the right insular region.  CT angiogram of chest as well as head and neck showed partially imaged type of a aortic dissection involving the ascending aorta and transverse portion but not the descending aorta.  Dissection involves innominate artery with thrombosis of the false lumen.  This extended into the right common  carotid with narrowing of the true lumen to a minimal diameter of 1.5 mm.  No proximal intracranial vessel occlusion.  There was noted occlusion of small distal right M2 and proximal M3 MCA branch.  Echocardiogram with ejection fraction of 55 to 60%.  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 Roxy [name].  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  Mild mass-effect including trace leftward midline shift and again repeated 11/12/2020 for comparison showing interval development of petechial hemorrhage associated with evolving posterior right MCA and MCA/PCA watershed territory infarct no mass occupying hemorrhagic transformation. There was a superficial vein thrombosis left arm noted and intravenous heparin was initiated 11/12/2020 as well as remaining on low-dose aspirin.  EEG completed showing no seizure activity.  Hospital course patient did require intubation followed by pulmonary services.  Patient currently remains n.p.o. with alternative means of nutritional support.  Hospital course complicated by severe agitation delirium and impulsiveness placed on  Seroquel and  valproic acid.  Therapy evaluations completed due to patient decreased functional ability altered mental status was recommended for a comprehensive rehab program on 7/21.  On 7/22 pt was found unresponsive, in respiratory arrest.  Code blue was called and pt was intubated and transferred back to acute care.  Followup head CT showed expected evolution of CVA.  Pt also noted to be covid positive.  Pt required vasopressors.  Extubated and currently tolerating room air.  Completed antibiotics, remdesevir, and steroids.  Repeat chest xray with mild residual left basilar linear atelectasis.  Encephalopathy  resolving.  Therapy ongoing and recommendations are for pt to return to CIR.   Patient's medical record from Jesse [name] has been reviewed by the rehabilitation admission coordinator and physician.   Past Medical History      Past Medical History:  Diagnosis Date   Anxiety     Depression     Essential hypertension     S/P aortic dissection repair 11/05/2020    Straight graft replacement of ascending aorta and proximal transverse aortic arch with re-suspension of native aortic valve and open hemi arch distal anastomosis with aorta to right common carotid bypass and aorta to right subclavian bypass      Family History   family history is not on file.   Prior Rehab/Hospitalizations Has the patient had prior rehab or hospitalizations prior to admission? Yes   Has the patient had major surgery during 100 days prior to admission? Yes              Current Medications   Current Facility-Administered Medications:   acetaminophen (TYLENOL) 160 MG/5ML solution 650 mg, 650 mg, Oral, Q6H PRN, Jesse Poisson, MD   amLODipine (NORVASC) tablet 10 mg, 10 mg, Oral, Daily, Jesse [name], Vijaya, MD, 10 mg at 11/26/20 0845   ascorbic acid (VITAMIN C) tablet 500 mg, 500 mg, Oral, Daily, Jesse [name], Vijaya, MD, 500 mg at 11/26/20 0846   aspirin chewable tablet 81 mg, 81 mg, Oral, Daily, Jesse Poisson, MD, 81 mg at 11/26/20 0845   baricitinib (OLUMIANT) tablet 2 mg, 2 mg, Oral, Daily, Jesse [name], Vijaya, MD, 2 mg at 11/26/20 0845   carvedilol (COREG) tablet 3.125 mg, 3.125 mg, Oral, BID WC, Jesse [name], Vijaya, MD, 3.125 mg at 11/26/20 1703   Chlorhexidine Gluconate Cloth 2 % PADS 6 each, 6 each, Topical, Daily, Jesse Furbish, MD, 6 each at 11/26/20 0847   enoxaparin (LOVENOX) injection 40 mg, 40 mg, Subcutaneous, Q12H, Jesse Leff, MD, 40 mg at 11/26/20 2231   feeding supplement (OSMOLITE 1.5 CAL) liquid 1,200 mL, 1,200 mL, Per Tube, Q24H, Jesse Poisson, MD, Last Rate: 75 mL/hr at 11/26/20 1714, 1,200 mL at 11/26/20  1714   feeding supplement (PROSource TF) liquid 45 mL, 45 mL, Per Tube, QID, Jesse Poisson, MD, 45 mL at 11/26/20 2232   haloperidol lactate (HALDOL) injection 2 mg, 2 mg, Intravenous, Q6H PRN, Jesse Poisson, MD, 2 mg at 11/23/20 1704   hydrALAZINE (APRESOLINE) injection 20 mg, 20 mg, Intravenous, Q4H PRN, Stretch, Marily Lente, MD, 20 mg at 11/23/20 2132   hydrALAZINE (APRESOLINE) tablet 50 mg, 50 mg, Oral, Q8H, Akula, Vijaya, MD, 50 mg at 11/27/20 0542   insulin aspart (novoLOG) injection 0-15 Units, 0-15 Units, Subcutaneous, Q4H, Jesse Furbish, MD, 2 Units at 11/27/20 0400   labetalol (NORMODYNE) injection 10-20 mg, 10-20 mg, Intravenous, Q2H PRN, Jesse Leff, MD, 20 mg at 11/20/20 1220   LORazepam (ATIVAN) injection 0.5-1 mg, 0.5-1 mg, Intravenous, Q6H PRN, Jesse Poisson, MD, 1  mg at 11/23/20 1804   MEDLINE mouth rinse, 15 mL, Mouth Rinse, BID, Jesse Furbish, MD, 15 mL at 11/26/20 0847   oxyCODONE (ROXICODONE) 5 MG/5ML solution 5-10 mg, 5-10 mg, Oral, Q4H PRN, Jesse Poisson, MD   pantoprazole sodium (PROTONIX) 40 mg/20 mL oral suspension 40 mg, 40 mg, Oral, Daily, Jesse Poisson, MD, 40 mg at 11/26/20 0846   polyethylene glycol (MIRALAX / GLYCOLAX) packet 17 g, 17 g, Oral, Daily PRN, Jesse Poisson, MD   QUEtiapine (SEROQUEL) tablet 50 mg, 50 mg, Oral, QHS, Akula, Vijaya, MD, 50 mg at 11/26/20 2224   sodium chloride flush (NS) 0.9 % injection 10-40 mL, 10-40 mL, Intracatheter, Q12H, Jesse [name], Vijaya, MD, 10 mL at 11/26/20 0847   sodium chloride flush (NS) 0.9 % injection 10-40 mL, 10-40 mL, Intracatheter, PRN, Jesse Poisson, MD   zinc sulfate capsule 220 mg, 220 mg, Oral, Daily, Jesse Poisson, MD, 220 mg at 11/26/20 0845   Patients Current Diet:  Diet Order                  Diet - low sodium heart healthy             DIET DYS 2 Room service appropriate? No; Fluid consistency: Thin  Diet effective now                         Precautions / Restrictions Precautions Precautions:  Fall, Sternal Precaution Booklet Issued: No Precaution Comments: cortrak Restrictions Weight Bearing Restrictions: No RUE Weight Bearing: Weight bearing as tolerated LUE Weight Bearing: Weight bearing as tolerated Other Position/Activity Restrictions: sternal    Has the patient had 2 or more falls or a fall with injury in the past year? No   Prior Activity Level Community (5-7x/wk): active PTA, no DME, driving, has 3 children   Prior Functional Level Self Care: Did the patient need help bathing, dressing, using the toilet or eating? Independent   Indoor Mobility: Did the patient need assistance with walking from room to room (with or without device)? Independent   Stairs: Did the patient need assistance with internal or external stairs (with or without device)? Independent   Functional Cognition: Did the patient need help planning regular tasks such as shopping or remembering to take medications? Independent   Home Assistive Devices / Equipment Home Assistive Devices/Equipment: None   Prior Device Use: Indicate devices/aids used by the patient prior to current illness, exacerbation or injury? None of the above   Current Functional Level Cognition   Arousal/Alertness: Awake/alert Overall Cognitive Status: Difficult to assess Difficult to assess due to: Impaired communication Current Attention Level: Sustained Orientation Level: Disoriented X4 Following Commands: Follows one step commands consistently Safety/Judgement: Decreased awareness of safety, Decreased awareness of deficits General Comments: patient more verbal this session than previous sessions. Continues to have some word finding difficulty. Attention: Sustained Sustained Attention: Impaired Sustained Attention Impairment: Verbal basic, Functional basic Memory:  (DTA) Awareness: Impaired Problem Solving: Impaired Problem Solving Impairment: Verbal basic, Functional basic    Extremity Assessment (includes  Sensation/Coordination)   Upper Extremity Assessment: Difficult to assess due to impaired cognition RUE Deficits / Details: able to hold R UE up against gravity, but difficult to assess due to cognition LUE Coordination: decreased fine motor, decreased gross motor  Lower Extremity Assessment: Defer to PT evaluation RLE Deficits / Details: pt raises both LEs against gravity at bed level LLE Deficits / Details: pt raises both LEs against gravity at bed level  ADLs   Overall ADL's : Needs assistance/impaired Eating/Feeding: NPO Grooming: Moderate assistance, Sitting, Wash/dry hands, Wash/dry face Grooming Details (indicate cue type and reason): able to wash hands with washcloth when given to pt though unable to follow cues to wipe face (specifically food in facial hair) so assist needed to complete this task Lower Body Dressing: Sitting/lateral leans, Total assistance Toilet Transfer: Minimal assistance, Ambulation, BSC, Regular Toilet, RW Toilet Transfer Details (indicate cue type and reason): Pt overall min guard with use of RW though had R hand on RW and L hand on therapist's shoulders. able to stand without assist from toilet [facility] over regular toilet due to tall stature). On exit of bathroom, trialed without AD with unsteadiness noted with only one hand on therapist's shoulder. Cues to avoid crossing feet and taking big steps Functional mobility during ADLs: Minimal assistance, Rolling walker, Cueing for sequencing, Cueing for safety General ADL Comments: Consistent cues/assist needed for safety due to impulsivity though progressing mobility. Difficulty sequencing ADLs but appears to respond better to tactile cues and initiation of tasks with pt     Mobility   Overal bed mobility: Needs Assistance Bed Mobility: Supine to Sit Rolling: Mod assist Supine to sit: Min assist, HOB elevated Sit to supine: Min assist General bed mobility comments: Patient received up in recliner with wife  present in room.     Transfers   Overall transfer level: Needs assistance Equipment used: None Transfers: Sit to/from Stand Sit to Stand: Min guard General transfer comment: min guard for safety, no physical assist needed to power up, noted to push RW away from in front of self     Ambulation / Gait / Stairs / Wheelchair Mobility   Ambulation/Gait Ambulation/Gait assistance: Herbalist (Feet): 150 Feet Assistive device: 1 person hand held assist Gait Pattern/deviations: Step-through pattern, Staggering right, Staggering left General Gait Details: Patient ambulated out in hallway with single hand held assist. Reaches for rail in hallway on occasion. Unsteady with ambulation. Gait velocity: slightly decreased Gait velocity interpretation: <1.8 ft/sec, indicate of risk for recurrent falls     Posture / Balance Dynamic Sitting Balance Sitting balance - Comments: did not challenge balance Balance Overall balance assessment: Needs assistance Sitting-balance support: Feet supported Sitting balance-Leahy Scale: Good Sitting balance - Comments: did not challenge balance Standing balance support: Single extremity supported, During functional activity Standing balance-Leahy Scale: Poor Standing balance comment: reliant on at least one UE support with mobility, external support needed with only one UE support     Special needs/care consideration N/a    Previous Home Environment (from acute therapy documentation) Living Arrangements: Children, Spouse/significant other  Lives With: Spouse Available Help at Discharge: Family, Available 24 hours/day Type of Home: House Home Layout: Able to live on main level with bedroom/bathroom Home Access: Stairs to enter Entrance Stairs-Rails: None Entrance Stairs-Number of Steps: 2 Bathroom Shower/Tub: Optometrist: Yes Home Care Services: No Additional Comments: pt non-verbal;  information from CIR charting   Discharge Living Setting Plans for Discharge Living Setting: Patient's home, Lives with (comment) (spouse and 3 children) Type of Home at Discharge: House Discharge Home Layout: Able to live on main level with bedroom/bathroom Discharge Home Access: Stairs to enter Entrance Stairs-Rails: None Entrance Stairs-Number of Steps: 2 Discharge Bathroom Shower/Tub: Tub/shower unit Discharge Bathroom Toilet: Standard Discharge Bathroom Accessibility: Yes How Accessible: Accessible via walker Does the patient have any problems obtaining your medications?: No   Social/Family/Support Systems Patient Roles: Spouse,  Parent Contact Information: children ages 26, 48, and 36 Anticipated Caregiver: spouse, Barnett Applebaum Anticipated Caregiver's Contact Information: [phone] Ability/Limitations of Caregiver: n/a Caregiver Availability: 24/7 Discharge Plan Discussed with Primary Caregiver: Yes Is Caregiver In Agreement with Plan?: Yes Does Caregiver/Family have Issues with Lodging/Transportation while Pt is in Rehab?: No   Goals Patient/Family Goal for Rehab: PT/OT supervision; SLP supervision to min assist Expected length of stay: 8-12 days Additional Information: agitation/restlessness significantly improved Pt/Family Agrees to Admission and willing to participate: Yes Program Orientation Provided & Reviewed with Pt/Caregiver Including Roles  & Responsibilities: Yes Additional Information Needs: n/a  Barriers to Discharge: Insurance for SNF coverage, Home environment access/layout   Decrease burden of Care through IP rehab admission: n/a   Possible need for SNF placement upon discharge: no   Patient Condition: I have reviewed medical records from [facility], spoken with CSW, and patient and spouse. I met with patient at the bedside for inpatient rehabilitation assessment.  Patient will benefit from ongoing PT, OT, and SLP, can actively participate in 3 hours of therapy  a day 5 days of the week, and can make measurable gains during the admission.  Patient will also benefit from the coordinated team approach during an Inpatient Acute Rehabilitation admission.  The patient will receive intensive therapy as well as Rehabilitation physician, nursing, social worker, and care management interventions.  Due to safety, skin/wound care, disease management, medication administration, pain management, and patient education the patient requires 24 hour a day rehabilitation nursing.  The patient is currently min assist with mobility and basic ADLs.  Discharge setting and therapy post discharge at  home   is anticipated.  Patient has agreed to participate in the Acute Inpatient Rehabilitation Program and will admit today.   Preadmission Screen Completed By:  Michel Santee, PT, DPT 11/27/2020 10:35 AM ______________________________________________________________________   Discussed status with Dr. Ranell Patrick on 11/27/20  at 10:40 AM  and received approval for admission today.   Admission Coordinator:  Michel Santee, PT, DPT, time 10:40 AM Sudie Grumbling 11/27/20     Assessment/Plan: Diagnosis: Debility Does the need for close, 24 hr/day Medical supervision in concert with the patient's rehab needs make it unreasonable for this patient to be served in a less intensive setting? Yes Co-Morbidities requiring supervision/potential complications: acute respiratory failure with hypoxia, respiratory arrest, hypotension, pressure injury of skin, essential hypertension Due to bladder management, bowel management, safety, skin/wound care, disease management, medication administration, pain management, and patient education, does the patient require 24 hr/day rehab nursing? Yes Does the patient require coordinated care of a physician, rehab nurse, PT, OT, and SLP to address physical and functional deficits in the context of the above medical diagnosis(es)? Yes Addressing deficits in the following  areas: balance, endurance, locomotion, strength, transferring, bowel/bladder control, bathing, dressing, feeding, grooming, toileting, swallowing, and psychosocial support Can the patient actively participate in an intensive therapy program of at least 3 hrs of therapy 5 days a week? Yes The potential for patient to make measurable gains while on inpatient rehab is excellent Anticipated functional outcomes upon discharge from inpatient rehab: modified independent PT, modified independent OT, modified independent SLP Estimated rehab length of stay to reach the above functional goals is: 5-7 days Anticipated discharge destination: Home 10. Overall Rehab/Functional Prognosis: excellent     MD Signature: Leeroy Cha, MD

## 2020-11-27 NOTE — Progress Notes (Signed)
Inpatient Rehab Admissions Coordinator:   I have a bed for this patient to admit to CIR today.  Dr. Blake Divine in agreement and [facility] aware.  Will let pt/family know.   Estill Dooms, PT, DPT Admissions Coordinator [phone] 11/27/20  10:33 AM

## 2020-11-28 LAB — CBC WITH DIFFERENTIAL/PLATELET
Abs Immature Granulocytes: 0.07 10*3/uL (ref 0.00–0.07)
Basophils Absolute: 0 10*3/uL (ref 0.0–0.1)
Basophils Relative: 0 %
Eosinophils Absolute: 0.1 10*3/uL (ref 0.0–0.5)
Eosinophils Relative: 1 %
HCT: 33.8 % — ABNORMAL LOW (ref 39.0–52.0)
Hemoglobin: 11.1 g/dL — ABNORMAL LOW (ref 13.0–17.0)
Immature Granulocytes: 1 %
Lymphocytes Relative: 18 %
Lymphs Abs: 1.4 10*3/uL (ref 0.7–4.0)
MCH: 32.4 pg (ref 26.0–34.0)
MCHC: 32.8 g/dL (ref 30.0–36.0)
MCV: 98.5 fL (ref 80.0–100.0)
Monocytes Absolute: 0.6 10*3/uL (ref 0.1–1.0)
Monocytes Relative: 7 %
Neutro Abs: 5.8 10*3/uL (ref 1.7–7.7)
Neutrophils Relative %: 73 %
Platelets: 354 10*3/uL (ref 150–400)
RBC: 3.43 MIL/uL — ABNORMAL LOW (ref 4.22–5.81)
RDW: 13.9 % (ref 11.5–15.5)
WBC: 8 10*3/uL (ref 4.0–10.5)
nRBC: 0 % (ref 0.0–0.2)

## 2020-11-28 LAB — GLUCOSE, CAPILLARY
Glucose-Capillary: 101 mg/dL — ABNORMAL HIGH (ref 70–99)
Glucose-Capillary: 101 mg/dL — ABNORMAL HIGH (ref 70–99)
Glucose-Capillary: 117 mg/dL — ABNORMAL HIGH (ref 70–99)
Glucose-Capillary: 124 mg/dL — ABNORMAL HIGH (ref 70–99)
Glucose-Capillary: 124 mg/dL — ABNORMAL HIGH (ref 70–99)
Glucose-Capillary: 132 mg/dL — ABNORMAL HIGH (ref 70–99)

## 2020-11-28 LAB — COMPREHENSIVE METABOLIC PANEL
ALT: 27 U/L (ref 0–44)
AST: 19 U/L (ref 15–41)
Albumin: 2.4 g/dL — ABNORMAL LOW (ref 3.5–5.0)
Alkaline Phosphatase: 90 U/L (ref 38–126)
Anion gap: 7 (ref 5–15)
BUN: 36 mg/dL — ABNORMAL HIGH (ref 6–20)
CO2: 26 mmol/L (ref 22–32)
Calcium: 8.5 mg/dL — ABNORMAL LOW (ref 8.9–10.3)
Chloride: 101 mmol/L (ref 98–111)
Creatinine, Ser: 1.2 mg/dL (ref 0.61–1.24)
GFR, Estimated: >60 mL/min (ref >60)
Glucose, Bld: 125 mg/dL — ABNORMAL HIGH (ref 70–99)
Potassium: 4.3 mmol/L (ref 3.5–5.1)
Sodium: 134 mmol/L — ABNORMAL LOW (ref 135–145)
Total Bilirubin: 0.5 mg/dL (ref 0.3–1.2)
Total Protein: 5.6 g/dL — ABNORMAL LOW (ref 6.5–8.1)

## 2020-11-28 MED ORDER — ENSURE ENLIVE PO LIQD
237.0000 mL | Freq: Two times a day (BID) | ORAL | Status: DC
  Administered 2020-11-28 – 2020-12-11 (×22): 237 mL via ORAL

## 2020-11-28 MED ORDER — CHLORHEXIDINE GLUCONATE CLOTH 2 % EX PADS
6.0000 | MEDICATED_PAD | Freq: Every day | CUTANEOUS | Status: DC
  Administered 2020-11-28 – 2020-12-05 (×8): 6 via TOPICAL

## 2020-11-28 MED ORDER — LIDOCAINE HCL URETHRAL/MUCOSAL 2 % EX GEL
CUTANEOUS | Status: DC | PRN
  Administered 2020-11-28 – 2020-12-07 (×9): 6 via TOPICAL
  Filled 2020-11-28 (×12): qty 6

## 2020-11-28 MED ORDER — FREE WATER
200.0000 mL | Freq: Three times a day (TID) | Status: DC
  Administered 2020-11-28 – 2020-11-29 (×3): 200 mL

## 2020-11-28 NOTE — Evaluation (Signed)
Physical Therapy Assessment and Plan  Patient Details  Name: Jesse [name] MRN: [id number] Date of Birth: [birth year]  PT Diagnosis: Abnormal posture, Abnormality of gait, Cognitive deficits, Coordination disorder, Difficulty walking, Impaired cognition, and Muscle weakness Rehab Potential: Good ELOS: 7-10 days   Today's Date: 11/28/2020 PT Individual Time: 1315-1415 PT Individual Time Calculation (min): 60 min    Hospital Problem: Principal Problem:   Acute ischemic right MCA stroke [facility])   Past Medical History:  Past Medical History:  Diagnosis Date   Anxiety    Depression    Essential hypertension    S/P aortic dissection repair 11/05/2020   Straight graft replacement of ascending aorta and proximal transverse aortic arch with re-suspension of native aortic valve and open hemi arch distal anastomosis with aorta to right common carotid bypass and aorta to right subclavian bypass   Past Surgical History:  Past Surgical History:  Procedure Laterality Date   ASCENDING AORTIC ROOT REPLACEMENT N/A 11/04/2020   Procedure: 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 WITH REPLACEMENT OF ASCENDING AORTA AND HEMIARCH USING HEMASHIELD PLATINUM 26MM GRAFT AND HEMASHIELD GOLD 14 X 8MM GRAFT, RESUSPENSION OF NATIVE VALVE, AORTA TO RIGHT CAROTID BYPASS, AORTA TO RIGHT SUBCLAVIAN BYPASS;  Surgeon: Rexene Alberts, MD;  Location: [facility];  Service: Open Heart Surgery;  Laterality: N/A;   TEE WITHOUT CARDIOVERSION N/A 11/04/2020   Procedure: TRANSESOPHAGEAL ECHOCARDIOGRAM (TEE);  Surgeon: Rexene Alberts, MD;  Location: [place];  Service: Open Heart Surgery;  Laterality: N/A;    Assessment & Plan Clinical Impression: Jesse [name] is a 45 year old male with history of HTN, depression, anxiety who was originally admitted to [facility] on 11/04/2020 with aphasia, blurry vision in right eye and mental status changes.  He was found to have aortic dissection extending into the right common carotid with narrowing  of the true lumen as well as occlusion of small distal right M2 and proximal M3 MCA branch.  He underwent emergent repair of ascending thoracic aortic dissection with resuspension of native aortic valve by Dr. Roxy Manns on the same day.  Follow-up CT of head showed relatively large infarct with cytotoxic edema in posterior right MCA and MCA/PCA watershed territories.  Follow-up CT on 07/20 showed interval development of petechial hemorrhage with evolving posterior right MCA and PCA MCA infarct.  He was found to have superficial thrombosis of left antecubital vein which was treated with IV heparin as well as low-dose ASA.  Hospital course was significant for issues with severe agitation due to delirium as well as poor safety awareness with impulsivity.  Therapy evaluations were completed and CIR was recommended due to functional decline.   He was admitted to CIR on 11/13/2020 and therapy evaluations completed however on 07/22 he was noted to be hypotensive and tachycardic with agonal breathing.  CODE BLUE initiated and he was briefly intubated and started on low-dose levo.  Respiratory arrest felt to be due to aspiration pneumonia and he was started on Rocephin.  He was also noted to be COVID positive and was started on remdesivir, steroids as well as baricitinib.  Follow-up CT showed acute infarct with progressive gyral density with question of hemorrhagic transformation.  He tolerated extubation without difficulty on 07/23 and neurology felt patient with evolution of stroke.  Repeat CT of head of serial CT of head done for follow-up to repeat CT of 07/29 showed evolving right MCA territory infarct with decreasing edema and mass-effect and new acute or subacute small bilateral cerebellar infarcts.  Cortak  placed due to poor p.o. intake and for nutritional support secondary to severe malnutrition.  Swallow evaluation done revealing oropharyngeal dysphagia with prolonged mastication and he was started on dysphagia 1 with  thin liquids.  He continues to have issues with dysphagia.  He has poor attention, expressive/receptive deficits, cognitive deficits, weakness, unsteady/staggering gait mobility as well as ADLs.  CIR recommended due to functional decline.  Patient transferred to CIR on 11/27/2020 .   Patient currently requires min with mobility secondary to muscle weakness, decreased cardiorespiratoy endurance, impaired timing and sequencing and motor apraxia, possible field cut, decreased attention to left and ideational apraxia, decreased initiation, decreased attention, decreased awareness, decreased problem solving, decreased safety awareness, and decreased memory, and decreased standing balance, decreased postural control, and decreased balance strategies.  Prior to hospitalization, patient was independent  with mobility and lived with Spouse in a House home.  Home access is 2Stairs to enter.  Patient will benefit from skilled PT intervention to maximize safe functional mobility, minimize fall risk, and decrease caregiver burden for planned discharge home with 24 hour supervision.  Anticipate patient will benefit from follow up OP at discharge.  PT - End of Session Activity Tolerance: Tolerates < 10 min activity, no significant change in vital signs Endurance Deficit: Yes PT Assessment Rehab Potential (ACUTE/IP ONLY): Good PT Patient demonstrates impairments in the following area(s): Balance;Behavior;Endurance;Motor;Nutrition;Perception;Safety;Sensory;Skin Integrity PT Transfers Functional Problem(s): Bed Mobility;Bed to Chair;Car;Furniture PT Locomotion Functional Problem(s): Ambulation;Stairs PT Plan PT Intensity: Minimum of 1-2 x/day ,45 to 90 minutes PT Frequency: 5 out of 7 days PT Duration Estimated Length of Stay: 7-10 days PT Treatment/Interventions: Ambulation/gait training;Discharge planning;Functional mobility training;Psychosocial support;Therapeutic Activities;Visual/perceptual  remediation/compensation;Balance/vestibular training;Disease management/prevention;Neuromuscular re-education;Skin care/wound management;Therapeutic Exercise;Wheelchair propulsion/positioning;Cognitive remediation/compensation;DME/adaptive equipment instruction;Pain management;Splinting/orthotics;UE/LE Strength taining/ROM;Community reintegration;Functional electrical stimulation;Patient/family education;Stair training;UE/LE Coordination activities PT Transfers Anticipated Outcome(s): Supervision PT Locomotion Anticipated Outcome(s): Ambulatory with supervision LRAD / CGA with hand held assist PT Recommendation Follow Up Recommendations: Outpatient PT Patient destination: Home Equipment Recommended: To be determined   PT Evaluation Precautions/Restrictions Precautions Precautions: Sternal;Fall Precaution Comments: cortrak Restrictions Other Position/Activity Restrictions: sternal General Chart Reviewed: Yes Family/Caregiver Present: No Vital Signs Pain Pain Assessment Pain Scale: Faces Faces Pain Scale: No hurt Home Living/Prior Functioning Home Living Available Help at Discharge: Family;Available 24 hours/day Type of Home: House Home Access: Stairs to enter [employer] of Steps: 2 Entrance Stairs-Rails: None Home Layout: Able to live on main level with bedroom/bathroom Bathroom Shower/Tub: Chiropodist: Standard Bathroom Accessibility: Yes Additional Comments: pt non-verbal; information from CIR charting  Lives With: Spouse Prior Function Level of Independence: Independent with basic ADLs;Independent with transfers;Independent with gait;Independent with homemaking with ambulation  Able to Take Stairs?: Yes Driving: Yes Vocation: Unemployed Vocation Requirements: per brother, pt worked as a tow Administrator Comments: prior to initial admission 7/12 Vision/Perception  Vision - Assessment Additional Comments: Noted left field cut and/or  inattention. Possible impaired depth perception or diplopia to the right. Pt able to track in all fields smoothly with no noticable difference between L/R. Difficulty with assessment 2/2 to global aphasia. Observations from mobility and during tasks Perception Inattention/Neglect: Does not attend to left visual field;Other (comment) (Noted left field cut and/or inattention.Difficulty with assessment 2/2 to global aphasia. Observations from mobility and during tasks) Praxis Praxis: Impaired Praxis Impairment Details: Initiation;Ideation  Cognition Overall Cognitive Status: Impaired/Different from baseline Arousal/Alertness: Awake/alert Orientation Level: Disoriented X4 Self Correcting: Impaired Self Correcting Impairment: Functional basic Safety/Judgment: Impaired Sensation Sensation Light Touch: Appears Intact Additional Comments: difficulty  assessing 2/2 global aphasia. Pt responds with head turn with light touch to LUE/LLE Coordination Gross Motor Movements are Fluid and Coordinated: No Fine Motor Movements are Fluid and Coordinated: No Heel Shin Test: pt unable to follow commands for assessment Motor  Motor Motor: Motor apraxia   Trunk/Postural Assessment  Cervical Assessment Cervical Assessment: Within Functional Limits Thoracic Assessment Thoracic Assessment: Within Functional Limits Lumbar Assessment Lumbar Assessment: Within Functional Limits (posterior pelvic tilt in sititng) Postural Control Postural Control: Deficits on evaluation Protective Responses: delayed  Balance Balance Balance Assessed: Yes Static Sitting Balance Static Sitting - Balance Support: No upper extremity supported;Feet supported Static Sitting - Level of Assistance: 5: Stand by assistance Dynamic Sitting Balance Dynamic Sitting - Balance Support: Feet supported;No upper extremity supported Dynamic Sitting - Level of Assistance: 5: Stand by assistance Dynamic Sitting - Balance Activities:  Lateral lean/weight shifting;Reaching for objects;Reaching across midline Sitting balance - Comments: did not challenge balance Dynamic Standing Balance Dynamic Standing - Balance Support: Left upper extremity supported Dynamic Standing - Level of Assistance: 4: Min assist Extremity Assessment      RLE Assessment RLE Assessment: Within Functional Limits General Strength Comments: Possible hip weakness. Observations during functional mobility. Unable to formally MMT 2/2 global aphasia LLE Assessment LLE Assessment: Within Functional Limits General Strength Comments: Possible hip weakness. Observations during functional mobility. Unable to formally MMT 2/2 global aphasia.  Care Tool Care Tool Bed Mobility Roll left and right activity   Roll left and right assist level: Minimal Assistance - Patient > 75%    Sit to lying activity   Sit to lying assist level: Supervision/Verbal cueing    Lying to sitting edge of bed activity   Lying to sitting edge of bed assist level: Minimal Assistance - Patient > 75%     Care Tool Transfers Sit to stand transfer   Sit to stand assist level: Contact Guard/Touching assist    Chair/bed transfer   Chair/bed transfer assist level: Minimal Assistance - Patient > 75%     Toilet transfer   Assist Level: Minimal Assistance - Patient > 75%    Car transfer   Car transfer assist level: Minimal Assistance - Patient > 75%      Care Tool Locomotion Ambulation   Assist level: Minimal Assistance - Patient > 75% Assistive device: Hand held assist Max distance: 429  Walk 10 feet activity   Assist level: Minimal Assistance - Patient > 75% Assistive device: Hand held assist   Walk 50 feet with 2 turns activity   Assist level: Minimal Assistance - Patient > 75% Assistive device: Hand held assist  Walk 150 feet activity   Assist level: Minimal Assistance - Patient > 75% Assistive device: Hand held assist  Walk 10 feet on uneven surfaces activity Walk  10 feet on uneven surfaces activity did not occur: Safety/medical concerns      Stairs   Assist level: Minimal Assistance - Patient > 75% Stairs assistive device: 2 hand rails Max number of stairs: 4  Walk up/down 1 step activity Walk up/down 1 step or curb (drop down) activity did not occur: Safety/medical concerns        Walk up/down 4 steps activity Walk up/down 4 steps assist level: Minimal Assistance - Patient > 75% Walk up/down 4 steps assistive device: 2 hand rails  Walk up/down 12 steps activity Walk up/down 12 steps activity did not occur: Safety/medical concerns      Pick up small objects from floor Pick up small object from  the floor (from standing position) activity did not occur: Safety/medical concerns      Wheelchair Will patient use wheelchair at discharge?: No   Wheelchair activity did not occur: N/A      Wheel 50 feet with 2 turns activity Wheelchair 50 feet with 2 turns activity did not occur: N/A    Wheel 150 feet activity Wheelchair 150 feet activity did not occur: N/A      Refer to Care Plan for [place] 1 PT Short Term Goal 1 (Week 1): STG = LTG due to ELOS  Recommendations for other services: None   Skilled Therapeutic Intervention Pt received in bed upon entry. Pt unable to state or give "yes/no" answer to name. Evaluation completed (see details above). Unable to give patient education 2/2 global aphasia. Family not present during eval (however, present with OT), plan to follow up with family regarding purpose of PT evaluation, PT POC and goals, therapy schedule, weekly team meetings, and other CIR information including safety plan and fall risk safety.  Gait training with several bouts, longest distance during 3MWT. Narrowed BOS that grew increasingly more narrow, increased adduction and need for maintaining balance with fatigue. Pt requires manual guidance and large visual gestures for turns. Pt benefits from single hand  held assist, unsuccessful with RW due to deficits in sequencing with AD and inattention to L.   Pt completed 3 minute walk test with a distance of 458f single hand held assist CGA-MinA. ([place]orms for 3MWT 7656f Norms for patients that have experienced a stroke: [phone]  Car transfer with MinA hand held assist. 4" steps completed with MinA for steadying with 2 hand rails. Pt unable to understand directions to turn and come back down, instead descending using 3" steps.   Seated reaching activity with cones. Pt required hand over hand guidance for directions with reaching and placing cones cross body. After two hand over hand guidance, pt able to complete task with visual gestures. Noted possible depth perception deficits/diplopia on the right during task. Difficulty officially assessing 2/2 global aphasia.   Nustep completed 3 minutes with multimodal cuing for attention to left side of body. Intermittently required repositioning L hand on handle due to inattention/sliding off handle.   Vitals taken in session: 115/92 BP, 102 HR, 100% SpO2 with seated rest break following gait.  128/85 BP, 102 HR, 89-100% SpO2 following 3 minutes on NuStep. Asymptomatic -initial SpO2 reading could be 2/2 poor perfusion. RN notified.  *Pt benefits from one step verbal cuing with large visual gestures during all functional mobility and activities.   Pt in bed with bed alarm on, given call bell and attempted to give directions for call button pt states "okay," however, unable to assess if pt understands 2/2 global aphasia.   Mobility Bed Mobility Bed Mobility: Rolling Left;Left Sidelying to Sit Rolling Left: Supervision/Verbal cueing Left Sidelying to Sit: Minimal Assistance - Patient >75% Transfers Transfers: Sit to Stand;Stand to Sit;Stand Pivot Transfers Sit to Stand: Contact Guard/Touching assist Stand to Sit: Contact Guard/Touching assist Stand Pivot Transfers: Contact Guard/Touching assist Transfer  (Assistive device): 1 person hand held assist Locomotion  Gait Ambulation: Yes Gait Assistance: Contact Guard/Touching assist;Minimal Assistance - Patient > 75% (CGA with MinA towards the end of distance) Gait Distance (Feet): 429 Feet Assistive device: 1 person hand held assist Gait Assistance Details: Visual cues/gestures for sequencing;Tactile cues for initiation;Tactile cues for posture Gait Assistance Details: Pt benefits from one step commands with large visual  gesture and manual gudiance when turn via hand held assist Gait Gait: Yes Gait Pattern: Impaired Gait Pattern: Narrow base of support (Narrow BOS that grew increasingly more narrow and boarderline adduction on left 2/2 hip weakness. Cervical flexion) Gait velocity: slightly decreased Stairs / Additional Locomotion Stairs: Yes Stairs Assistance: Minimal Assistance - Patient > 75% Stair Management Technique: Two rails Number of Stairs: 4 Height of Stairs: 6 Wheelchair Mobility Wheelchair Mobility: No   Discharge Criteria: Patient will be discharged from PT if patient refuses treatment 3 consecutive times without medical reason, if treatment goals not met, if there is a change in medical status, if patient makes no progress towards goals or if patient is discharged from hospital.  The above assessment, treatment plan, treatment alternatives and goals were discussed and mutually agreed upon: No family available/patient unable 2/2 global aphasia.  Aiman Sonn, SPT  11/28/2020, 5:20 PM

## 2020-11-28 NOTE — Plan of Care (Signed)
  Problem: RH Balance Goal: LTG Patient will maintain dynamic sitting balance (PT) Description: LTG:  Patient will maintain dynamic sitting balance with assistance during mobility activities (PT) Flowsheets (Taken 11/28/2020 1752) LTG: Pt will maintain dynamic sitting balance during mobility activities with:: Supervision/Verbal cueing Goal: LTG Patient will maintain dynamic standing balance (PT) Description: LTG:  Patient will maintain dynamic standing balance with assistance during mobility activities (PT) Flowsheets (Taken 11/28/2020 1752) LTG: Pt will maintain dynamic standing balance during mobility activities with:: Supervision/Verbal cueing   Problem: Sit to Stand Goal: LTG:  Patient will perform sit to stand with assistance level (PT) Description: LTG:  Patient will perform sit to stand with assistance level (PT) Flowsheets (Taken 11/28/2020 1752) LTG: PT will perform sit to stand in preparation for functional mobility with assistance level: Supervision/Verbal cueing   Problem: RH Bed Mobility Goal: LTG Patient will perform bed mobility with assist (PT) Description: LTG: Patient will perform bed mobility with assistance, with/without cues (PT). Flowsheets (Taken 11/28/2020 1752) LTG: Pt will perform bed mobility with assistance level of: Independent with assistive device    Problem: RH Bed to Chair Transfers Goal: LTG Patient will perform bed/chair transfers w/assist (PT) Description: LTG: Patient will perform bed to chair transfers with assistance (PT). Flowsheets (Taken 11/28/2020 1752) LTG: Pt will perform Bed to Chair Transfers with assistance level: Supervision/Verbal cueing   Problem: RH Car Transfers Goal: LTG Patient will perform car transfers with assist (PT) Description: LTG: Patient will perform car transfers with assistance (PT). Flowsheets (Taken 11/28/2020 1752) LTG: Pt will perform car transfers with assist:: Supervision/Verbal cueing   Problem: RH Ambulation Goal: LTG  Patient will ambulate in controlled environment (PT) Description: LTG: Patient will ambulate in a controlled environment, # of feet with assistance (PT). Flowsheets (Taken 11/28/2020 1752) LTG: Pt will ambulate in controlled environ  assist needed:: Supervision/Verbal cueing LTG: Ambulation distance in controlled environment: 500 Goal: LTG Patient will ambulate in home environment (PT) Description: LTG: Patient will ambulate in home environment, # of feet with assistance (PT). Flowsheets (Taken 11/28/2020 1752) LTG: Pt will ambulate in home environ  assist needed:: Supervision/Verbal cueing LTG: Ambulation distance in home environment: 150   Problem: RH Stairs Goal: LTG Patient will ambulate up and down stairs w/assist (PT) Description: LTG: Patient will ambulate up and down # of stairs with assistance (PT) Flowsheets (Taken 11/28/2020 1752) LTG: Pt will ambulate up/down stairs assist needed:: Supervision/Verbal cueing LTG: Pt will  ambulate up and down number of stairs: 4

## 2020-11-28 NOTE — IPOC Note (Signed)
Overall Plan of Care [facility]) Patient Details Name: Jesse [name] MRN: [id number] DOB: Jul 28, 1975  Admitting Diagnosis: Acute ischemic right MCA stroke [facility])  Hospital Problems: Principal Problem:   Acute ischemic right MCA stroke [facility])     Functional Problem List: Nursing Bowel, Nutrition, Medication Management, Safety, Pain, Endurance, Skin Integrity  PT Balance, Behavior, Endurance, Motor, Nutrition, Perception, Safety, Sensory, Skin Integrity  OT Balance, Perception, Safety, Cognition, Endurance, Vision, Motor, Nutrition  SLP Behavior, Cognition, Perception, Safety, Linguistic, Nutrition  TR         Basic ADL's: OT Grooming, Bathing, Dressing, Toileting, Eating     Advanced  ADL's: OT       Transfers: PT Bed Mobility, Bed to Chair, Car, Occupational psychologist, Research scientist (life sciences): PT Ambulation, Stairs     Additional Impairments: OT    SLP Swallowing, Communication, Social Cognition comprehension, expression Social Interaction, Attention, Problem Solving  TR      Anticipated Outcomes Item Anticipated Outcome  Self Feeding supervision  Swallowing  supervision A   Basic self-care  mod I-supervision  Toileting  supervision   Bathroom Transfers supervision  Bowel/Bladder  manage bowel w mod I and bladder with no assist  Transfers     Locomotion     Communication  modA basic multimodal expression, modA basic comprehension  Cognition  modA basic problem solving  Pain  at or below level 4  Safety/Judgment  maintain safety with cues/reminders   Therapy Plan: PT Intensity: Minimum of 1-2 x/day ,45 to 90 minutes PT Frequency: 5 out of 7 days PT Duration Estimated Length of Stay: 7-10 days OT Intensity: Minimum of 1-2 x/day, 45 to 90 minutes OT Frequency: 5 out of 7 days OT Duration/Estimated Length of Stay: 7-10 days SLP Frequency: 3 to 5 out of 7 days SLP Duration/Estimated Length of Stay: 7-10 days   Due to the current state of emergency,  patients may not be receiving their 3-hours of Medicare-mandated therapy.   Team Interventions: Nursing Interventions Bladder Management, Disease Management/Prevention, Medication Management, Discharge Planning, Pain Management, Bowel Management, Patient/Family Education, Dysphagia/Aspiration Precaution Training, Skin Care/Wound Management  PT interventions Ambulation/gait training, Discharge planning, Functional mobility training, Psychosocial support, Therapeutic Activities, Visual/perceptual remediation/compensation, Balance/vestibular training, Disease management/prevention, Neuromuscular re-education, Skin care/wound management, Therapeutic Exercise, Wheelchair propulsion/positioning, Cognitive remediation/compensation, DME/adaptive equipment instruction, Pain management, Splinting/orthotics, UE/LE Strength taining/ROM, Community reintegration, Development worker, international aid stimulation, Patient/family education, Museum/gallery curator, UE/LE Coordination activities  OT Interventions Warden/ranger, Discharge planning, Self Care/advanced ADL retraining, Therapeutic Activities, UE/LE Coordination activities, Pain management, Cognitive remediation/compensation, Disease mangement/prevention, Functional mobility training, Patient/family education, Therapeutic Exercise, Visual/perceptual remediation/compensation, DME/adaptive equipment instruction, Skin care/wound managment, Neuromuscular re-education, Psychosocial support, UE/LE Strength taining/ROM  SLP Interventions Cognitive remediation/compensation, Speech/Language facilitation, Environmental controls, Cueing hierarchy, Functional tasks, Patient/family education, Multimodal communication approach, Dysphagia/aspiration precaution training  TR Interventions    SW/CM Interventions Discharge Planning, Psychosocial Support, Patient/Family Education, Disease Management/Prevention   Barriers to Discharge MD  Medical stability  Nursing Decreased caregiver  support, Home environment Best boy, Insurance for SNF coverage, Nutrition means 2 level 2 ste 0 rail w spouse/ B+B on main, w 3 children in the home  PT      OT Nutrition means [employer]trak  SLP      SW Insurance for SNF coverage, Home environment access/layout patient uninsured   Team Discharge Planning: Destination: PT-Home ,OT- Home , SLP-Home Projected Follow-up: PT- , OT-  24 hour supervision/assistance, SLP-Home Health SLP, 24 hour supervision/assistance Projected Equipment Needs: PT-To be determined, OT-  To be determined, SLP-None recommended by SLP Equipment Details: PT- , OT-Pts brother reports they have a transport w/c that their mom used; not sure of measurements. Patient/family involved in discharge planning: PT- Patient unable/family or caregiver not available,  OT-Family member/caregiver, Patient, SLP-Patient unable/family or caregive not available  MD ELOS: 7-10d Medical Rehab Prognosis:  Good Assessment: 45 year old male with history of HTN, depression, anxiety who was originally admitted to [facility] on 11/04/2020 with aphasia, blurry vision in right eye and mental status changes.  He was found to have aortic dissection extending into the right common carotid with narrowing of the true lumen as well as occlusion of small distal right M2 and proximal M3 MCA branch.  He underwent emergent repair of ascending thoracic aortic dissection with resuspension of native aortic valve by Dr. Cornelius Moras on the same day.  Follow-up CT of head showed relatively large infarct with cytotoxic edema in posterior right MCA and MCA/PCA watershed territories.  Follow-up CT on 07/20 showed interval development of petechial hemorrhage with evolving posterior right MCA and PCA MCA infarct.  He was found to have superficial thrombosis of left antecubital vein which was treated with IV heparin as well as low-dose ASA.  Hospital course was significant for issues with severe agitation due to delirium as well as poor  safety awareness with impulsivity.  Therapy evaluations were completed and CIR was recommended due to functional decline.   He was admitted to CIR on 11/13/2020 and therapy evaluations completed however on 07/22 he was noted to be hypotensive and tachycardic with agonal breathing.  CODE BLUE initiated and he was briefly intubated and started on low-dose levo.  Respiratory arrest felt to be due to aspiration pneumonia and he was started on Rocephin.  He was also noted to be COVID positive and was started on remdesivir, steroids as well as baricitinib.  Follow-up CT showed acute infarct with progressive gyral density with question of hemorrhagic transformation.  He tolerated extubation without difficulty on 07/23 and neurology felt patient with evolution of stroke.  Repeat CT of head of serial CT of head done for follow-up to repeat CT of 07/29 showed evolving right MCA territory infarct with decreasing edema and mass-effect and new acute or subacute small bilateral cerebellar infarcts.  Cortak placed due to poor p.o. intake and for nutritional support secondary to severe malnutrition.  Swallow evaluation done revealing oropharyngeal dysphagia with prolonged mastication and he was started on dysphagia 1 with thin liquids.  He continues to have issues with dysphagia.  He has poor attention, expressive/receptive deficits, cognitive deficits, weakness, unsteady/staggering gait mobility as well as ADLs.  CIR recommended due to functional decline.      See Team Conference Notes for weekly updates to the plan of care

## 2020-11-28 NOTE — Evaluation (Signed)
Speech Language Pathology Assessment and Plan  Patient Details  Name: Jesse [name] MRN: [id number] Date of Birth: 18-Dec-1975  SLP Diagnosis: Aphasia;Dysphagia;Cognitive Impairments  Rehab Potential: Good ELOS: 7-10 days    Today's Date: 11/28/2020 SLP Individual Time: 0800-0900 60 minutes     Hospital Problem: Principal Problem:   Acute ischemic right MCA stroke [name])  Past Medical History:  Past Medical History:  Diagnosis Date   Anxiety    Depression    Essential hypertension    S/P aortic dissection repair 11/05/2020   Straight graft replacement of ascending aorta and proximal transverse aortic arch with re-suspension of native aortic valve and open hemi arch distal anastomosis with aorta to right common carotid bypass and aorta to right subclavian bypass   Past Surgical History:  Past Surgical History:  Procedure Laterality Date   ASCENDING AORTIC ROOT REPLACEMENT N/A 11/04/2020   Procedure: 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 WITH REPLACEMENT OF ASCENDING AORTA AND HEMIARCH USING HEMASHIELD PLATINUM 26MM GRAFT AND HEMASHIELD GOLD 14 X 8MM GRAFT, RESUSPENSION OF NATIVE VALVE, AORTA TO RIGHT CAROTID BYPASS, AORTA TO RIGHT SUBCLAVIAN BYPASS;  Surgeon: Rexene Alberts, MD;  Location: [place];  Service: Open Heart Surgery;  Laterality: N/A;   TEE WITHOUT CARDIOVERSION N/A 11/04/2020   Procedure: TRANSESOPHAGEAL ECHOCARDIOGRAM (TEE);  Surgeon: Rexene Alberts, MD;  Location: [place];  Service: Open Heart Surgery;  Laterality: N/A;    Assessment / Plan / Recommendation  Jesse [name] is a 45 year old male with history of HTN, depression, anxiety who was originally admitted to [facility] on 11/04/2020 with aphasia, blurry vision in right eye and mental status changes.  He was found to have aortic dissection extending into the right common carotid with narrowing of the true lumen as well as occlusion of small distal right M2 and proximal M3 MCA branch.  He underwent emergent repair of  ascending thoracic aortic dissection with resuspension of native aortic valve by Dr. Roxy Manns on the same day.  Follow-up CT of head showed relatively large infarct with cytotoxic edema in posterior right MCA and MCA/PCA watershed territories.  Follow-up CT on 07/20 showed interval development of petechial hemorrhage with evolving posterior right MCA and PCA MCA infarct.  He was found to have superficial thrombosis of left antecubital vein which was treated with IV heparin as well as low-dose ASA.  Hospital course was significant for issues with severe agitation due to delirium as well as poor safety awareness with impulsivity.  Therapy evaluations were completed and CIR was recommended due to functional decline.   He was admitted to CIR on 11/13/2020 and therapy evaluations completed however on 07/22 he was noted to be hypotensive and tachycardic with agonal breathing.  CODE BLUE initiated and he was briefly intubated and started on low-dose levo.  Respiratory arrest felt to be due to aspiration pneumonia and he was started on Rocephin.  He was also noted to be COVID positive and was started on remdesivir, steroids as well as baricitinib.  Follow-up CT showed acute infarct with progressive gyral density with question of hemorrhagic transformation.  He tolerated extubation without difficulty on 07/23 and neurology felt patient with evolution of stroke.  Repeat CT of head of serial CT of head done for follow-up to repeat CT of 07/29 showed evolving right MCA territory infarct with decreasing edema and mass-effect and new acute or subacute small bilateral cerebellar infarcts.  Cortak placed due to poor p.o. intake and for nutritional support secondary to severe malnutrition.  Swallow evaluation done revealing oropharyngeal dysphagia with prolonged mastication and he was started on dysphagia 1 with thin liquids.  He continues to have issues with dysphagia.  He has poor attention, expressive/receptive deficits, cognitive  deficits, weakness, unsteady/staggering gait mobility as well as ADLs.  Patient transferred to CIR on 11/27/2020 .    Clinical Impression Patient presents with a severe mixed receptive-expressive aphasia, mod-severe cognitive impairment and mild-mod oropharyngeal dysphagia. No overt s/s aspiration or penetration with thin liquids but did exhibit oral delay with mastication and transit of Dys 2 solids. Patient answered all biographical and immediate environment questions with "yeah" or "okay" but never replied with a negatory response. He demonstrated some echolalia when SLP asking him immediate wants/needs questions, and would respond with phrases such as "whatever you like". He was initially able to count number of fingers that SLP held out in front of him, but when this was attempted again later, his accuracy declined significantly. When presented with an object, picture or photo, he was not able to name or demonstrate that he was even seeing adequately. (he would put hand above eyes as if shielding the sun and would turn his head as if attempting to get a better angle visually, but this did not result in an improved response. He was not able to follow any 1-step verbal directions, even with SLP modeling. He was able to feed self with spoon and pick up and drink from cup or straw, but exhibited some questionable ideational apraxia, (putting toothbrush in mouth but barely moving it, trying to drink with lid still on and inability to wipe face with washcloth. Patient was very pleasant and cooperative with no agitation. He drank all liquids on meal tray but ate not more than 20% of solids. (breakfast meal). SLP was not able to assess memory due to severe aphasia. Patient will require skilled SLP intervention during CIR stay and at next venue of care due to severely impaired communication, cognition and mild-mod impaired swallow function.  Skilled Therapeutic Interventions          BSE, SLE  SLP Assessment  Patient  will need skilled Speech Lanaguage Pathology Services during CIR admission    Recommendations  SLP Diet Recommendations: Dysphagia 2 (Fine chop);Thin Liquid Administration via: Straw;Cup Medication Administration: Crushed with puree Supervision: Patient able to self feed;Full supervision/cueing for compensatory strategies Compensations: Minimize environmental distractions;Small sips/bites;Slow rate;Follow solids with liquid Oral Care Recommendations: Oral care BID;Staff/trained caregiver to provide oral care Patient destination: Home Follow up Recommendations: Home Health SLP;24 hour supervision/assistance Equipment Recommended: None recommended by SLP    SLP Frequency 3 to 5 out of 7 days   SLP Duration  SLP Intensity  SLP Treatment/Interventions 7-10 days     Cognitive remediation/compensation;Speech/Language facilitation;Environmental controls;Cueing hierarchy;Functional tasks;Patient/family education;Multimodal communication approach;Dysphagia/aspiration precaution training    Pain Pain Assessment Pain Scale: Faces Faces Pain Scale: No hurt  Prior Functioning Cognitive/Linguistic Baseline: Within functional limits Type of Home: House  Lives With: Spouse Available Help at Discharge: Family;Available 24 hours/day Vocation: Unemployed  SLP Evaluation Cognition Overall Cognitive Status: Impaired/Different from baseline Arousal/Alertness: Awake/alert Orientation Level: Disoriented X4 Attention: Sustained;Focused Focused Attention: Appears intact Sustained Attention: Impaired Sustained Attention Impairment: Functional basic Memory: Impaired Memory Impairment: Storage deficit;Retrieval deficit;Decreased recall of new information;Decreased short term memory Decreased Short Term Memory: Functional basic;Verbal basic Immediate Memory Recall:  (unable to assess due to global aphasia) Memory Recall Blue:  (difficult to assess due to global aphasia) Memory Recall Bed:   (difficult to assess due  to global aphasia) Awareness: Impaired Awareness Impairment: Intellectual impairment Problem Solving: Impaired Problem Solving Impairment: Functional basic Executive Function: Initiating;Self Correcting;Sequencing;Decision Making Sequencing: Impaired Sequencing Impairment: Functional basic Decision Making: Impaired Decision Making Impairment: Functional basic Initiating: Impaired Initiating Impairment: Functional basic Self Correcting: Impaired Self Correcting Impairment: Functional basic Safety/Judgment: Impaired  Comprehension Auditory Comprehension Overall Auditory Comprehension: Impaired Yes/No Questions: Impaired Basic Biographical Questions: 0-25% accurate Basic Immediate Environment Questions: 0-24% accurate Commands: Impaired One Step Basic Commands: 25-49% accurate Interfering Components: Visual impairments;Attention EffectiveTechniques: Extra processing time;Visual/Gestural cues Visual Recognition/Discrimination Discrimination: Not tested Reading Comprehension Reading Status: Not tested Expression Expression Primary Mode of Expression: Verbal Verbal Expression Overall Verbal Expression: Impaired Initiation: Impaired Automatic Speech: Name Level of Generative/Spontaneous Verbalization: Word Repetition: Impaired Level of Impairment: Word level Naming: Impairment Responsive: 0-25% accurate Confrontation: Impaired Convergent: Not tested Divergent: Not tested Verbal Errors: Not aware of errors;Echolalia;Perseveration Pragmatics: Impairment Impairments: Eye contact;Abnormal affect Interfering Components: Attention Effective Techniques: Open ended questions;Semantic cues Non-Verbal Means of Communication: Gestures Written Expression Dominant Hand: Right Written Expression: Not tested Oral Motor Oral Motor/Sensory Function Overall Oral Motor/Sensory Function: Generalized oral weakness (patient unable to participate in full oral motor  exam) Motor Speech Overall Motor Speech: Other (comment) (very limited verbal production, difficult to fully assess) Respiration: Within functional limits Phonation: Low vocal intensity;Hoarse Resonance: Within functional limits Motor Planning: Not tested  Care Tool Care Tool Cognition Expression of Ideas and Wants Expression of Ideas and Wants: Rarely/Never expressess or very difficult - rarely/never expresses self or speech is very difficult to understand   Understanding Verbal and Non-Verbal Content Understanding Verbal and Non-Verbal Content: Sometimes understands - understands only basic conversations or simple, direct phrases. Frequently requires cues to understand   Memory/Recall Ability *first 3 days only Memory/Recall Ability *first 3 days only: None of the above were recalled     Bedside Swallowing Assessment General Date of Onset: 11/04/20 Previous Swallow Assessment: 7/28 BSE Diet Prior to this Study: Dysphagia 2 (chopped);Thin liquids Temperature Spikes Noted: No Respiratory Status: Room air History of Recent Intubation: Yes Date extubated: 11/15/20 Behavior/Cognition: Alert;Cooperative;Pleasant mood Oral Cavity - Dentition: Edentulous Self-Feeding Abilities: Needs assist;Needs set up Patient Positioning: Upright in bed Baseline Vocal Quality: Low vocal intensity;Hoarse Volitional Cough: Cognitively unable to elicit Volitional Swallow: Unable to elicit  Oral Care Assessment   Ice Chips   Thin Liquid Thin Liquid: Within functional limits Presentation: Cup;Straw Other Comments: No overt s/s aspiration or penetration with thin liquids, oral phase appeared WFL Nectar Thick   Honey Thick   Puree   Solid Solid: Impaired Oral Phase Impairments: Impaired mastication Oral Phase Functional Implications: Impaired mastication;Prolonged oral transit BSE Assessment Risk for Aspiration Impact on safety and function: Mild aspiration risk Other Related Risk Factors:  Previous CVA;Cognitive impairment  Short Term Goals: Week 1: SLP Short Term Goal 1 (Week 1): Patient will follow one step verbal directions with maxA cues. SLP Short Term Goal 2 (Week 1): Patient will tolerate trials of Dys 3 solids without significant oral phase difficulty with modA. SLP Short Term Goal 3 (Week 1): Patient will point to common objects in field of two with 60% accuracy and maxA cues. SLP Short Term Goal 4 (Week 1): Patient will perform basic level functional tasks (brush teeth, feed self, wash up, etc) with setup and modA cues. SLP Short Term Goal 5 (Week 1): Patient will indicate basic level needs via multi-modal communication (verbal, visual, gestural) with maxA cues.  Refer to Care Plan for Long Term Goals  Recommendations  for other services: None   Discharge Criteria: Patient will be discharged from SLP if patient refuses treatment 3 consecutive times without medical reason, if treatment goals not met, if there is a change in medical status, if patient makes no progress towards goals or if patient is discharged from hospital.  The above assessment, treatment plan, treatment alternatives and goals were discussed and mutually agreed upon: by family (SLP spoke with patient's brother who arrived after evaluation completed)   Sonia Baller, MA, CCC-SLP Speech Therapy

## 2020-11-28 NOTE — Progress Notes (Signed)
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/05/2020   Straight graft replacement of ascending aorta and proximal transverse aortic arch with re-suspension of native aortic valve and open hemi arch distal anastomosis with aorta to right common carotid bypass and aorta to right subclavian bypass   Past Surgical History:  Past Surgical History:  Procedure Laterality Date   ASCENDING AORTIC ROOT REPLACEMENT N/A 11/04/2020   Procedure: 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 He has never used smokeless tobacco. He reports current alcohol use of about 2.0 standard drinks of alcohol per week. He reports previous drug use.  Family / Support Systems Marital Status: Married Patient Roles: Spouse Spouse/Significant Other: Richelle Ito Children: 3 Children (25, 13, 10) Other Supports: Allayne Gitelman (Father) Anticipated Caregiver: Gina (Spouse) Ability/Limitations of Caregiver: n/a Caregiver Availability: 24/7 Family Dynamics: Patient has family support  Social History Preferred  language: English Religion: Christian Read: Yes Write: Yes Employment Status: Employed Name of Employer: Programmer, applications Issues: n/a Guardian/Conservator: n/a   Abuse/Neglect Abuse/Neglect Assessment Can Be Completed: Yes Physical Abuse: Denies Verbal Abuse: Denies Sexual Abuse: Denies Exploitation of patient/patient's resources: Denies Self-Neglect: Denies  Emotional Status Pt's affect, behavior and adjustment status: Patient pleasant, cognition def Recent Psychosocial Issues: hx of anxiety, depression, coping Psychiatric History: n/a Substance Abuse History: n/a  Patient / Family Perceptions, Expectations & Goals Pt/Family understanding of illness & functional limitations: patient non -verbal? Premorbid pt/family roles/activities: Independent and driving Anticipated changes in roles/activities/participation: spouse able to assist with roles and task Pt/family expectations/goals: Supervision to [place]: None Premorbid Home Care/DME Agencies: None Transportation available at discharge: Family able to transport Resource referrals recommended: Neuropsychology (hx of anxiety, depression, coping)  Discharge Planning Living Arrangements: Spouse/significant other, Children Support Systems: Spouse/significant other, Children Type of Residence: Private residence (2 steps to enter home D/C to mothers home) Insurance Resources: Teacher, adult education Resources: Employment Museum/gallery curator Screen Referred: No Living Expenses: Lives with family Money Management: Patient, Spouse Does the patient have any problems obtaining your medications?: Yes (Describe) (patient uninsured) Home Management: Independent Patient/Family Preliminary Plans: SW will arrange spouse. Family to assist with money and medication mang Care Coordinator Barriers to Discharge: Insurance for SNF coverage, Home environment access/layout Care Coordinator  Barriers to Discharge Comments: patient uninsured DC Planning Additional Notes/Comments: uninsured, patient non verbal Expected length of stay: 8-12 Days  Clinical Impression Sw met with patient and family at bedside. Introduced self, explained role and addressed questions and concerns. SPH present informing family of intake levels. Family reports spouse is currently working n Medicaid, sw will follow  up with spouse on Monday. Patient plans to discharge to mothers home until patients home construction is complete. Mothers home is 2 level, 2 steps to enter. Patient home require no steps. No additional questions or concerns, sw will continue to follow up.  Dyanne Iha 11/28/2020, 12:16 PM

## 2020-11-28 NOTE — Progress Notes (Addendum)
PROGRESS NOTE   Subjective/Complaints:  Pt has difficulty following commands, perseverate on UE testing  ROS- answers yes to all questions  Objective:   No results found. Recent Labs    11/26/20 0500 11/28/20 0514  WBC 6.7 8.0  HGB 11.3* 11.1*  HCT 33.7* 33.8*  PLT 395 354   Recent Labs    11/26/20 0500 11/28/20 0514  NA 135 134*  K 3.9 4.3  CL 101 101  CO2 26 26  GLUCOSE 105* 125*  BUN 31* 36*  CREATININE 0.96 1.20  CALCIUM 8.4* 8.5*    Intake/Output Summary (Last 24 hours) at 11/28/2020 7510 Last data filed at 11/28/2020 0600 Gross per 24 hour  Intake 683.75 ml  Output 1800 ml  Net -1116.25 ml     Pressure Injury 11/14/20 Vertebral column Stage 1 -  Intact skin with non-blanchable redness of a localized area usually over a bony prominence. 3x4 area w/ scab on outside and pink center on bony prominince (Active)  11/14/20 0800  Location: Vertebral column  Location Orientation:   Staging: Stage 1 -  Intact skin with non-blanchable redness of a localized area usually over a bony prominence.  Wound Description (Comments): 3x4 area w/ scab on outside and pink center on bony prominince  Present on Admission: Yes    Physical Exam: Vital Signs Blood pressure 128/84, pulse (!) 107, temperature 98.8 F (37.1 C), temperature source Oral, resp. rate 18, height 6\' 2"  (1.88 m), weight 61 kg, SpO2 100 %.  ENT COrtrak tube  General: No acute distress Mood and affect are appropriate Heart: Regular rate and rhythm no rubs murmurs or extra sounds,  Lungs: Clear to auscultation, breathing unlabored, no rales or wheezes Abdomen: Positive bowel sounds, soft nontender to palpation, nondistended Extremities: No clubbing, cyanosis, or edema Skin: No evidence of breakdown, no evidence of rash, sternotomy incision CDI  Neurologic: Cranial nerves II through XII intact, motor strength is 5/5 in bilateral deltoid, bicep, tricep,  grip, hip flexor, knee extensors, ankle dorsiflexor and plantar flexor Sensory exam normal sensation to light touch and proprioception in bilateral upper and lower extremities Cerebellar exam normal finger to nose to finger as well as heel to shin in bilateral upper and lower extremities Musculoskeletal: Full range of motion in all 4 extremities. No joint swelling   Assessment/Plan: 1. Functional deficits which require 3+ hours per day of interdisciplinary therapy in a comprehensive inpatient rehab setting. Physiatrist is providing close team supervision and 24 hour management of active medical problems listed below. Physiatrist and rehab team continue to assess barriers to discharge/monitor patient progress toward functional and medical goals  Care Tool:  Bathing              Bathing assist       Upper Body Dressing/Undressing Upper body dressing        Upper body assist Assist Level: Moderate Assistance - Patient 50 - 74%    Lower Body Dressing/Undressing Lower body dressing            Lower body assist       Toileting Toileting    Toileting assist       Transfers Chair/bed transfer  Transfers assist           Locomotion Ambulation   Ambulation assist              Walk 10 feet activity   Assist           Walk 50 feet activity   Assist           Walk 150 feet activity   Assist           Walk 10 feet on uneven surface  activity   Assist           Wheelchair     Assist               Wheelchair 50 feet with 2 turns activity    Assist            Wheelchair 150 feet activity     Assist          Blood pressure 128/84, pulse (!) 107, temperature 98.8 F (37.1 C), temperature source Oral, resp. rate 18, height 6\' 2"  (1.88 m), weight 61 kg, SpO2 100 %.    Medical Problem List and Plan: 1.  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  Antithrombotics: -DVT/anticoagulation:  Pharmaceutical: Lovenox             -antiplatelet therapy: ASA 3. Pain Management:  Oxycodone 4. Mood: LCSW to follow for evaluation and support.             -antipsychotic agents: N/A 5. Neuropsych: This patient is not capable of making decisions on his own behalf. 6. Skin/Wound Care:  Routine pressure-relief measures. -- Monitor wound for healing. 7. Fluids/Electrolytes/Nutrition: Monitor I's/O.eating 100% meals but poor po fluid intake cont cortrak for free H2O , d/c TF 8.  COVID PNA: To continue baricitinib for 1 additional day 9.  HTN: Monitor blood pressures 3 times daily.  Continue Coreg and hydralazine. Vitals:   11/27/20 1936 11/28/20 0421  BP: 129/90 128/84  Pulse: (!) 101 (!) 107  Resp: 18 18  Temp: 99.4 F (37.4 C) 98.8 F (37.1 C)  SpO2: 100% 100%    10.  Dysphagia: Tolerating dysphagia 2, thin liquids without aspiration.             --Change tube feeds to nights for now             -- will start calorie count to monitor intake. 11.  Anxiety/agitation: Will continue ativan prn. D/c haldol--last used 07/31. Continue SSI while on tube feeds.     LOS: 1 days A FACE TO FACE EVALUATION WAS PERFORMED  8/31 11/28/2020, 9:23 AM

## 2020-11-28 NOTE — Progress Notes (Signed)
Inpatient Rehabilitation Center Individual Statement of Services  Patient Name:  Jesse [name]  Date:  11/28/2020  Welcome to the Inpatient Rehabilitation Center.  Our goal is to provide you with an individualized program based on your diagnosis and situation, designed to meet your specific needs.  With this comprehensive rehabilitation program, you will be expected to participate in at least 3 hours of rehabilitation therapies Monday-Friday, with modified therapy programming on the weekends.  Your rehabilitation program will include the following services:  Physical Therapy (PT), Occupational Therapy (OT), Speech Therapy (ST), 24 hour per day rehabilitation nursing, Therapeutic Recreaction (TR), Neuropsychology, Care Coordinator, Rehabilitation Medicine, Nutrition Services, Pharmacy Services, and Other  Weekly team conferences will be held on Wednesday to discuss your progress.  Your Inpatient Rehabilitation Care Coordinator will talk with you frequently to get your input and to update you on team discussions.  Team conferences with you and your family in attendance may also be held.  Expected length of stay:  8-12 Days  Overall anticipated outcome:  Supervision to [place] [facility]  Depending on your progress and recovery, your program may change. Your Inpatient Rehabilitation Care Coordinator will coordinate services and will keep you informed of any changes. Your Inpatient Rehabilitation Care Coordinator's name and contact numbers are listed  below.  The following services may also be recommended but are not provided by the Inpatient Rehabilitation Center:   Home Health Rehabiltiation Services Outpatient Rehabilitation Services    Arrangements will be made to provide these services after discharge if needed.  Arrangements include referral to agencies that provide these services.  Your insurance has been verified to be:  uninsured  Your primary doctor is:  No PCP  Pertinent information will be shared  with your doctor and your insurance company.  Inpatient Rehabilitation Care Coordinator:  Lavera Guise, [place] [phone] or [phone]  Information discussed with and copy given to patient by: Andria Rhein, 11/28/2020, 10:30 AM

## 2020-11-28 NOTE — Evaluation (Signed)
Occupational Therapy Assessment and Plan  Patient Details  Name: Jesse [name] MRN: [id number] Date of Birth: December 30, 1975  OT Diagnosis: apraxia, cognitive deficits, disturbance of vision, and muscle weakness (generalized) Rehab Potential: Rehab Potential (ACUTE ONLY): Good ELOS: 7-10 days   Today's Date: 11/28/2020 OT Individual Time: 1100-1200 OT Individual Time Calculation (min): 60 min     Hospital Problem: Principal Problem:   Acute ischemic right MCA stroke [facility])   Past Medical History:  Past Medical History:  Diagnosis Date   Anxiety    Depression    Essential hypertension    S/P aortic dissection repair 11/05/2020   Straight graft replacement of ascending aorta and proximal transverse aortic arch with re-suspension of native aortic valve and open hemi arch distal anastomosis with aorta to right common carotid bypass and aorta to right subclavian bypass   Past Surgical History:  Past Surgical History:  Procedure Laterality Date   ASCENDING AORTIC ROOT REPLACEMENT N/A 11/04/2020   Procedure: 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 WITH REPLACEMENT OF ASCENDING AORTA AND HEMIARCH USING HEMASHIELD PLATINUM 26MM GRAFT AND HEMASHIELD GOLD 14 X 8MM GRAFT, RESUSPENSION OF NATIVE VALVE, AORTA TO RIGHT CAROTID BYPASS, AORTA TO RIGHT SUBCLAVIAN BYPASS;  Surgeon: Rexene Alberts, MD;  Location: [place];  Service: Open Heart Surgery;  Laterality: N/A;   TEE WITHOUT CARDIOVERSION N/A 11/04/2020   Procedure: TRANSESOPHAGEAL ECHOCARDIOGRAM (TEE);  Surgeon: Rexene Alberts, MD;  Location: [place];  Service: Open Heart Surgery;  Laterality: N/A;    Assessment & Plan Clinical Impression: Jesse [name] is a 45 year old male with history of HTN, depression, anxiety who was originally admitted to [facility] on 11/04/2020 with aphasia, blurry vision in right eye and mental status changes.  He was found to have aortic dissection extending into the right common carotid with narrowing of the true lumen as  well as occlusion of small distal right M2 and proximal M3 MCA branch.  He underwent emergent repair of ascending thoracic aortic dissection with resuspension of native aortic valve by Dr. Roxy Manns on the same day.  Follow-up CT of head showed relatively large infarct with cytotoxic edema in posterior right MCA and MCA/PCA watershed territories.  Follow-up CT on 07/20 showed interval development of petechial hemorrhage with evolving posterior right MCA and PCA MCA infarct.  He was found to have superficial thrombosis of left antecubital vein which was treated with IV heparin as well as low-dose ASA.  Hospital course was significant for issues with severe agitation due to delirium as well as poor safety awareness with impulsivity.  Therapy evaluations were completed and CIR was recommended due to functional decline.   He was admitted to CIR on 11/13/2020 and therapy evaluations completed however on 07/22 he was noted to be hypotensive and tachycardic with agonal breathing.  CODE BLUE initiated and he was briefly intubated and started on low-dose levo.  Respiratory arrest felt to be due to aspiration pneumonia and he was started on Rocephin.  He was also noted to be COVID positive and was started on remdesivir, steroids as well as baricitinib.  Follow-up CT showed acute infarct with progressive gyral density with question of hemorrhagic transformation.  He tolerated extubation without difficulty on 07/23 and neurology felt patient with evolution of stroke.  Repeat CT of head of serial CT of head done for follow-up to repeat CT of 07/29 showed evolving right MCA territory infarct with decreasing edema and mass-effect and new acute or subacute small bilateral cerebellar infarcts.  Cortak placed  due to poor p.o. intake and for nutritional support secondary to severe malnutrition.  Swallow evaluation done revealing oropharyngeal dysphagia with prolonged mastication and he was started on dysphagia 1 with thin liquids.  He  continues to have issues with dysphagia.  He has poor attention, expressive/receptive deficits, cognitive deficits, weakness, unsteady/staggering gait mobility as well as ADLs.  Patient transferred to CIR on 11/27/2020 .    Patient currently requires min with basic self-care skills secondary to muscle weakness, decreased cardiorespiratoy endurance, motor apraxia, decreased coordination, and decreased motor planning, decreased visual perceptual skills, decreased attention to left and ideational apraxia, decreased initiation, decreased attention, decreased awareness, decreased problem solving, decreased safety awareness, decreased memory, and delayed processing, and decreased sitting balance, decreased standing balance, and decreased balance strategies.  Prior to hospitalization, patient could complete ADLs and IADls with independent .  Patient will benefit from skilled intervention to decrease level of assist with basic self-care skills prior to discharge home with care partner.  Anticipate patient will require 24 hour supervision and no further OT follow recommended.  OT - End of Session Activity Tolerance: Tolerates 30+ min activity with multiple rests Endurance Deficit: Yes Endurance Deficit Description: Pt required seated rest breaks during basic self care OT Assessment Rehab Potential (ACUTE ONLY): Good OT Barriers to Discharge: Nutrition means OT Barriers to Discharge Comments: [employer]trak OT Patient demonstrates impairments in the following area(s): Balance;Perception;Safety;Cognition;Endurance;Vision;Motor;Nutrition OT Basic ADL's Functional Problem(s): Grooming;Bathing;Dressing;Toileting;Eating OT Transfers Functional Problem(s): Toilet;Tub/Shower OT Plan OT Intensity: Minimum of 1-2 x/day, 45 to 90 minutes OT Frequency: 5 out of 7 days OT Duration/Estimated Length of Stay: 7-10 days OT Treatment/Interventions: Balance/vestibular training;Discharge planning;Self Care/advanced ADL  retraining;Therapeutic Activities;UE/LE Coordination activities;Pain management;Cognitive remediation/compensation;Disease mangement/prevention;Functional mobility training;Patient/family education;Therapeutic Exercise;Visual/perceptual remediation/compensation;DME/adaptive equipment instruction;Skin care/wound managment;Neuromuscular re-education;Psychosocial support;UE/LE Strength taining/ROM OT Self Feeding Anticipated Outcome(s): supervision OT Basic Self-Care Anticipated Outcome(s): mod I-supervision OT Toileting Anticipated Outcome(s): supervision OT Bathroom Transfers Anticipated Outcome(s): supervision OT Recommendation Patient destination: Home Follow Up Recommendations: 24 hour supervision/assistance Equipment Recommended: To be determined Equipment Details: Pts brother reports they have a transport w/c that their mom used; not sure of measurements.   OT Evaluation Precautions/Restrictions  Precautions Precautions: Sternal;Fall Precaution Comments: cortrak Restrictions Weight Bearing Restrictions: No RUE Weight Bearing: Weight bearing as tolerated LUE Weight Bearing: Weight bearing as tolerated Other Position/Activity Restrictions: sternal General Chart Reviewed: Yes Pain Pain Assessment Pain Scale: Faces Pain Score: 0-No pain Faces Pain Scale: No hurt Home Living/Prior Functioning Home Living Family/patient expects to be discharged to:: Private residence Living Arrangements: Spouse/significant other, Children Available Help at Discharge: Family, Available 24 hours/day Type of Home: House Home Access: Stairs to enter [employer] of Steps: 2 Entrance Stairs-Rails: None Home Layout: Able to live on main level with bedroom/bathroom Bathroom Shower/Tub: Government social research officer Accessibility: Yes Additional Comments: pt non-verbal; information from CIR charting  Lives With: Spouse Prior Function Level of Independence: Independent  with basic ADLs, Independent with transfers, Independent with gait, Independent with homemaking with ambulation  Able to Take Stairs?: Yes Driving: Yes Vocation: Unemployed Vocation Requirements: per brother, pt worked as a tow Administrator Comments: prior to initial admission 7/12 Vision Baseline Vision/History: No visual deficits Patient Visual Report: Other (comment) (difficult to assess due to global aphasia) Vision Assessment?: Vision impaired- to be further tested in functional context Perception  Perception: Impaired Inattention/Neglect: Does not attend to left visual field Praxis Praxis: Impaired Praxis Impairment Details: Initiation;Ideation;Ideomotor Cognition Overall Cognitive Status: Impaired/Different from baseline Arousal/Alertness: Awake/alert Orientation Level: Person;Place;Situation;Other(comment) (  difficult to assess due to global aphasia) Person: Oriented Place: Disoriented Situation: Disoriented Year: Other (Comment) (difficult to assess due to global aphasia) Month:  (difficult to assess due to global aphasia) Day of Week:  (difficult to assess due to global aphasia) Memory: Impaired Memory Impairment: Storage deficit;Retrieval deficit;Decreased recall of new information;Decreased short term memory Decreased Short Term Memory: Functional basic;Verbal basic Immediate Memory Recall:  (unable to assess due to global aphasia) Memory Recall Blue:  (difficult to assess due to global aphasia) Memory Recall Bed:  (difficult to assess due to global aphasia) Attention: Sustained;Focused Focused Attention: Appears intact Sustained Attention: Impaired Sustained Attention Impairment: Functional basic Awareness: Impaired Awareness Impairment: Intellectual impairment Problem Solving: Impaired Problem Solving Impairment: Functional basic Executive Function: Initiating;Self Correcting;Sequencing;Decision Making Sequencing: Impaired Sequencing Impairment: Functional  basic Decision Making: Impaired Decision Making Impairment: Functional basic Initiating: Impaired Initiating Impairment: Functional basic Self Correcting: Impaired Self Correcting Impairment: Functional basic Safety/Judgment: Impaired Sensation Sensation Light Touch: Appears Intact Hot/Cold: Appears Intact Proprioception: Appears Intact Stereognosis: Not tested Coordination Gross Motor Movements are Fluid and Coordinated: No Fine Motor Movements are Fluid and Coordinated: No Finger Nose Finger Test: pt unable to follow commands for assessment Motor  Motor Motor: Motor apraxia  Trunk/Postural Assessment  Cervical Assessment Cervical Assessment: Within Functional Limits Thoracic Assessment Thoracic Assessment: Within Functional Limits Lumbar Assessment Lumbar Assessment: Within Functional Limits Postural Control Postural Control: Deficits on evaluation Protective Responses: delayed  Balance Balance Balance Assessed: Yes Static Sitting Balance Static Sitting - Balance Support: No upper extremity supported;Feet supported Static Sitting - Level of Assistance: 5: Stand by assistance Dynamic Sitting Balance Dynamic Sitting - Balance Support: Feet supported;No upper extremity supported Dynamic Sitting - Level of Assistance: 5: Stand by assistance Static Standing Balance Static Standing - Balance Support: No upper extremity supported;During functional activity Static Standing - Level of Assistance: 5: Stand by assistance Dynamic Standing Balance Dynamic Standing - Balance Support: No upper extremity supported Dynamic Standing - Level of Assistance: 4: Min assist;3: Mod assist Extremity/Trunk Assessment RUE Assessment RUE Assessment: Within Functional Limits Active Range of Motion (AROM) Comments: within limitations of sternal precautions General Strength Comments: within limitations of sternal precautions LUE Assessment LUE Assessment: Within Functional Limits Active Range  of Motion (AROM) Comments: within limitations of sternal precautions General Strength Comments: within limitations of sternal precautions  Care Tool Care Tool Self Care Eating   Eating Assist Level: Moderate Assistance - Patient 50 - 74%    Oral Care    Oral Care Assist Level: Moderate Assistance - Patient 50 - 74%    Bathing     Body parts bathed by helper: Front perineal area;Buttocks   Assist Level: Minimal Assistance - Patient > 75%    Upper Body Dressing(including orthotics)       Assist Level: Maximal Assistance - Patient 25 - 49%    Lower Body Dressing (excluding footwear)     Assist for lower body dressing: Maximal Assistance - Patient 25 - 49%    Putting on/Taking off footwear   What is the patient wearing?: Non-skid slipper socks Assist for footwear: Minimal Assistance - Patient > 75%       Care Tool Toileting Toileting activity         Care Tool Bed Mobility Roll left and right activity        Sit to lying activity        Lying to sitting edge of bed activity         Care Tool Transfers  Sit to stand transfer        Chair/bed transfer         Toilet transfer   Assist Level: Minimal Assistance - Patient > 75%     Care Tool Cognition Expression of Ideas and Wants Expression of Ideas and Wants: Rarely/Never expressess or very difficult - rarely/never expresses self or speech is very difficult to understand   Understanding Verbal and Non-Verbal Content Understanding Verbal and Non-Verbal Content: Sometimes understands - understands only basic conversations or simple, direct phrases. Frequently requires cues to understand   Memory/Recall Ability *first 3 days only Memory/Recall Ability *first 3 days only: None of the above were recalled    Refer to Care Plan for [place] 1 OT Short Term Goal 1 (Week 1): STGs=LTGs due to ELOS  Recommendations for other services: None    Skilled Therapeutic  Intervention ADL ADL Grooming: Minimal assistance;Maximal cueing Where Assessed-Grooming: Sitting at sink Upper Body Bathing: Moderate assistance;Maximal cueing Where Assessed-Upper Body Bathing: Sitting at sink Lower Body Bathing: Minimal assistance;Maximal cueing Where Assessed-Lower Body Bathing: Sitting at sink;Standing at sink Upper Body Dressing: Maximal cueing;Moderate assistance Where Assessed-Upper Body Dressing: Sitting at sink;Standing at sink Lower Body Dressing: Maximal cueing;Maximal assistance Where Assessed-Lower Body Dressing: Sitting at sink;Standing at sink Mobility  Bed Mobility Bed Mobility: Rolling Left;Left Sidelying to Sit Rolling Left: Supervision/Verbal cueing Left Sidelying to Sit: Minimal Assistance - Patient >75% Transfers Sit to Stand: Contact Guard/Touching assist Stand to Sit: Contact Guard/Touching assist  Skilled Intervention: Pt semi upright in bed, pleasant, however limited intelligible speech when asked yes/no questions and open ended questions.  No signs of pain present throughout session.  Initial evaluation complete and brother arrived mid session and collaboration complete with therapist regarding POC. Functional mobility and self care completed per above levels of assist.  Therapist provided w/c cushion for weight distribution and skin integrity protection.  Pt showing no signs of impulsivity throughout session therefore allowed to sit up in w/c with pelvic safety alarm belt donned, and brother present in room.  Call bell in reach.    Discharge Criteria: Patient will be discharged from OT if patient refuses treatment 3 consecutive times without medical reason, if treatment goals not met, if there is a change in medical status, if patient makes no progress towards goals or if patient is discharged from hospital.  The above assessment, treatment plan, treatment alternatives and goals were discussed and mutually agreed upon: by patient and by  family  Ezekiel Slocumb 11/28/2020, 12:56 PM

## 2020-11-28 NOTE — Progress Notes (Signed)
Initial Nutrition Assessment  DOCUMENTATION CODES:   Underweight  INTERVENTION:  72 hour calorie count initiated.   Provide Ensure Enlive po BID, each supplement provides 350 kcal and 20 grams of protein.  Continue 45 ml Prosource TF QID per tube.   Continue free water flushes of 200 ml q 8 hours per tube.   NUTRITION DIAGNOSIS:   Increased nutrient needs related to  (therapy) as evidenced by estimated needs.  GOAL:   Patient will meet greater than or equal to 90% of their needs  MONITOR:   PO intake, Supplement acceptance, Skin, Weight trends, Labs, I & O's, Diet advancement  REASON FOR ASSESSMENT:   Consult Calorie Count  ASSESSMENT:   45 year old male with history of HTN originally admitted to [facility] on 11/04/2020 with aphasia, blurry vision in right eye and mental status changes. He was found to have aortic dissection extending into the right common carotid with narrowing of the true lumen as well as occlusion of small distal right M2 and proximal M3 MCA branch. He underwent emergent repair of ascending thoracic aortic dissection with resuspension of native aortic valve. Follow-up CT on 07/20 showed interval development of petechial hemorrhage with evolving posterior right MCA and PCA MCA infarct. Hospital course was significant for issues with severe agitation due to delirium as well as poor safety awareness with impulsivity. CIR was recommended due to functional decline. 07/22 he was noted to be hypotensive and tachycardic with agonal breathing.  CODE BLUE initiated and he was briefly intubated and noted to have aspiration PNA and COVID positive. Cortak placed due to poor p.o. intake and for nutritional support secondary to severe malnutrition. Diet advanced to dysphagia 2 diet with thin liquids.  Meal completion has been 100%. Pt has been tolerating his PO diet. Tube feeding discontinued today via MD. Cortrak NGT remains in place as pt with poor po fluid intake. Free water  flushes ordered for adequate fluid hydration. 72 hour calorie count initiated today to ensure adequate nutrition is met. RD to order nutritional supplements to aid in increased caloric and protein needs. Prosource TF ordered to ensure protein needs are met. Unable to complete Nutrition-Focused physical exam at this time.   Labs and medications reviewed.   Diet Order:   Diet Order             DIET DYS 2 Room service appropriate? No; Fluid consistency: Thin  Diet effective now                   EDUCATION NEEDS:   Not appropriate for education at this time  Skin:  Skin Assessment: Skin Integrity Issues: Skin Integrity Issues:: Stage I Stage I: vertebral column  Last BM:  7/31  Height:   Ht Readings from Last 1 Encounters:  11/27/20 _0  (1.88 m)    Weight:   Wt Readings from Last 1 Encounters:  11/28/20 61 kg    Ideal Body Weight:  86.36 kg  BMI:  Body mass index is 17.27 kg/m.  Estimated Nutritional Needs:   Kcal:  2100-2300  Protein:  115-130 grams  Fluid:  >/= 2 L/day  Corrin Parker, MS, RD, LDN RD pager number/after hours weekend pager number on Amion.

## 2020-11-29 DIAGNOSIS — I10 Essential (primary) hypertension: Secondary | ICD-10-CM

## 2020-11-29 DIAGNOSIS — R1312 Dysphagia, oropharyngeal phase: Secondary | ICD-10-CM

## 2020-11-29 LAB — GLUCOSE, CAPILLARY
Glucose-Capillary: 113 mg/dL — ABNORMAL HIGH (ref 70–99)
Glucose-Capillary: 104 mg/dL — ABNORMAL HIGH (ref 70–99)
Glucose-Capillary: 159 mg/dL — ABNORMAL HIGH (ref 70–99)
Glucose-Capillary: 79 mg/dL (ref 70–99)
Glucose-Capillary: 96 mg/dL (ref 70–99)
Glucose-Capillary: 79 mg/dL (ref 70–99)

## 2020-11-29 MED ORDER — INSULIN ASPART 100 UNIT/ML IJ SOLN
0.0000 [IU] | Freq: Three times a day (TID) | INTRAMUSCULAR | Status: DC
  Administered 2020-12-02 – 2020-12-08 (×7): 2 [IU] via SUBCUTANEOUS

## 2020-11-29 MED FILL — Medication: Qty: 1 | Status: AC

## 2020-11-29 NOTE — Progress Notes (Signed)
Physical Therapy Session Note  Patient Details  Name: Jesse [name] MRN: [id number] Date of Birth: May 26, 1975  Today's Date: 11/29/2020 PT Individual Time: 0803-0828; 7829-5621; 3086-5784 PT Individual Time Calculation (min): 25 min; 49 min; 42 min  Short Term Goals: Week 1:  PT Short Term Goal 1 (Week 1): STG = LTG due to ELOS  Skilled Therapeutic Interventions/Progress Updates:    Session One: Pt received in bed and agreeable to therapy stating. Gait belt placed prior to initiation of mobility. Super vision for bed mobility and STS throughout all sessions.   Corn hole activity completed x3. Bean bags placed to the left for improved left attention. Pt required hand over hand assistance with reaching/toss first 3 bags to understand task, then demonstrating great aim (all bags on the board!). 1x floor surface CGA, 1x aireax requiring minA to step up onto airex then CGA, 1x wedge minA to step up/ intermittently for steadying.   Pt ambulated <> day room with CGA, single hand held assist. Verbal cuing to promote upright posture. Intermittently increased narrow BOS/slight cross over, pt able to self correct balance with no additional assist.   Pt in bed with bed alarm on and call bell within reach.  Session Two: Pt received in bed and agreeable to therapy stating. Gait belt placed prior to initiation of mobility.  Pt ambulated <> day room with CGA, single hand held assist. Verbal cuing to promote upright posture. Noted improvement in BOS/balance this session.    Environmental scanning with cone location task 3x6 cones CGA. Pt required verbal/visual cuing for locating 5/6 cones (mostly pointing directly to cones) and improved to generalized area cuing for 1/6 cones during 3rd round. Increased difficulty with placing cones at varying heights and placement including grabbing cone from floor and over head.    Several bouts of obstacle navigation over 65ft, hand held assist. This included weaving  through bowling pins, stepping over objects of varying height, and unlevel surfaces. Pt required increased frequency of visual and one step cuing each time obstacles were changed to allow for understanding/motor planning task. CGA with intermittent MinA with directional changes and over unlevel surfaces. Utilized different objects with each task to improve success and prevent cross over/confusion 2/2 global aphasia.   Step ups 6" minA hand held assist forwards 2x16 alt. Attempted lateral step ups to target hip weakness, however pt unable to understand instructions and safely complete task 2/2 global aphasia. ModA for steadying and redirecting.   Nustep completed 3 minutes with multimodal cuing for attention to left side of body. Intermittently required repositioning L hand on handle due to inattention/sliding off handle.  Pt in bed with bed alarm on and call bell within reach. Father in room.   Session Three:  Pt received in bed and agreeable to therapy stating. Gait belt placed prior to initiation of mobility.  Pt completed multiple longer bouts of gait training, furthest distance >366ft CGA no hand held assist. One instance of "stumble step" when redirected with change in direction down hall with pt reaching out for therapist shoulder, no LOB.  Gait training on uneven surface with bean bags placed under mat x4 MinA progressing to CGA. Pt demonstrated ability to step up onto and off of mat and make necessary balance adjustments when stepping half way off of mat. Increased difficulty with stepping over objects of varying heights x4, occasional MinA for steadying. Verbal and visual cuing for object navigation. Pt improved ability to navigate obstacles as activity progressed. Hand held assist  throughout.  Ball toss on rebounder x 4 minA-modA. Transitioned to ball toss (CGA) with +2 for safety due to difficulty understanding instructions. Catching at various lateral angles x2 min. Noted difficulty visual  tracking when tossed towards the left. After a few near-miss catches, pt demonstrated problem solving skills of stepping to the left adjusting to visual deficits.  BITS visual scanning utilized x60min CGA-close supervision. Therapist provided hand over hand assist with first ~30 seconds until pt was able to understand task. 1x3 min on airex pad for balance training and intermittent minA for steadying. Pt demonstrated improved accuracy to 100% and 33 targets during second round with increased speed of tracking targets.   Pt in bed with bed alarm on and call bell within reach.   Therapy Documentation Precautions:  Precautions Precautions: Sternal, Fall Precaution Comments: cortrak Restrictions Weight Bearing Restrictions: No RUE Weight Bearing: Weight bearing as tolerated LUE Weight Bearing: Weight bearing as tolerated Other Position/Activity Restrictions: sternal  Pain: Pain Assessment Pain Scale: 0-10 Faces Pain Scale: No hurt  Therapy/Group: Individual Therapy  Ranesha Val, SPT 11/29/2020, 12:03 PM

## 2020-11-29 NOTE — Progress Notes (Signed)
Occupational Therapy Session Note  Patient Details  Name: Jesse [name] MRN: [id number] Date of Birth: [birth year]  Today's Date: 11/29/2020 OT Individual Time: 0900-1000 OT Individual Time Calculation (min): 60 min    Short Term Goals: Week 1:  OT Short Term Goal 1 (Week 1): STGs=LTGs due to ELOS  Skilled Therapeutic Interventions/Progress Updates:    OT session focused on safety awareness, cognitive remediation, ADL retraining, and functional mobility. Pt washed face, completed mouth wash rinse, and changing of clothes with min assist and mod-max multimodal cues. Transitioned to smaller therapy gym for minimal distractions. Pt completed dynamic standing balance task while prompting to identify and choose colors from field of 2 with poor accuracy noted, however CGA assist for balance. Completed pipe maze design 2x using same design to promote problem solving skills, direction following, and recall. Pt completed with overall mod prompting as OT also provide exact pieces to maze. Returned to room and encouraged toileting as pt ambulated to bathroom, however declined voiding. Pt left with in bed with all needs in reach.   Therapy Documentation Precautions:  Precautions Precautions: Sternal, Fall Precaution Comments: cortrak Restrictions Weight Bearing Restrictions: No RUE Weight Bearing: Weight bearing as tolerated LUE Weight Bearing: Weight bearing as tolerated Other Position/Activity Restrictions: sternal General:   Vital Signs:  Pain:   ADL: ADL Grooming: Minimal assistance, Maximal cueing Where Assessed-Grooming: Sitting at sink Upper Body Bathing: Moderate assistance, Maximal cueing Where Assessed-Upper Body Bathing: Sitting at sink Lower Body Bathing: Minimal assistance, Maximal cueing Where Assessed-Lower Body Bathing: Sitting at sink, Standing at sink Upper Body Dressing: Maximal cueing, Moderate assistance Where Assessed-Upper Body Dressing: Sitting at sink, Standing at  sink Lower Body Dressing: Maximal cueing, Maximal assistance Where Assessed-Lower Body Dressing: Sitting at sink, Standing at sink Vision   Perception    Praxis   Exercises:   Other Treatments:     Therapy/Group: Individual Therapy  [name] 11/29/2020, 9:50 AM

## 2020-11-29 NOTE — Progress Notes (Signed)
PROGRESS NOTE   Subjective/Complaints:  Pt in good spirits. Better results in rehab this time!. Eating well.   ROS: Patient denies fever, rash, sore throat, blurred vision, nausea, vomiting, diarrhea, cough, shortness of breath or chest pain, joint or back pain, headache, or mood change.   Objective:   No results found. Recent Labs    11/28/20 0514  WBC 8.0  HGB 11.1*  HCT 33.8*  PLT 354   Recent Labs    11/28/20 0514  NA 134*  K 4.3  CL 101  CO2 26  GLUCOSE 125*  BUN 36*  CREATININE 1.20  CALCIUM 8.5*    Intake/Output Summary (Last 24 hours) at 11/29/2020 1243 Last data filed at 11/29/2020 1015 Gross per 24 hour  Intake 418 ml  Output 3800 ml  Net -3382 ml     Pressure Injury 11/14/20 Vertebral column Stage 1 -  Intact skin with non-blanchable redness of a localized area usually over a bony prominence. 3x4 area w/ scab on outside and pink center on bony prominince (Active)  11/14/20 0800  Location: Vertebral column  Location Orientation:   Staging: Stage 1 -  Intact skin with non-blanchable redness of a localized area usually over a bony prominence.  Wound Description (Comments): 3x4 area w/ scab on outside and pink center on bony prominince  Present on Admission: Yes    Physical Exam: Vital Signs Blood pressure 128/84, pulse (!) 107, temperature 98.8 F (37.1 C), temperature source Oral, resp. rate 18, height 6\' 2"  (1.88 m), weight 63.2 kg, SpO2 100 %.  Constitutional: No distress . Vital signs reviewed. Frail appearing HEENT: NCAT, EOMI, oral membranes moist, NGT Neck: supple Cardiovascular: RRR without murmur. No JVD    Respiratory/Chest: CTA Bilaterally without wheezes or rales. Normal effort    GI/Abdomen: BS +, non-tender, non-distended Ext: no clubbing, cyanosis, or edema Psych: pleasant and cooperative  Skin: No evidence of breakdown, no evidence of rash, sternotomy incision CDI  Neurologic:  Cranial nerves II through XII intact, motor strength is 5/5 in bilateral deltoid, bicep, tricep, grip, hip flexor, knee extensors, ankle dorsiflexor and plantar flexor Sensory exam normal sensation to light touch and proprioception in bilateral upper and lower extremities Cerebellar exam normal finger to nose to finger as well as heel to shin in bilateral upper and lower extremities Musculoskeletal: Full range of motion in all 4 extremities. No joint swelling   Assessment/Plan: 1. Functional deficits which require 3+ hours per day of interdisciplinary therapy in a comprehensive inpatient rehab setting. Physiatrist is providing close team supervision and 24 hour management of active medical problems listed below. Physiatrist and rehab team continue to assess barriers to discharge/monitor patient progress toward functional and medical goals  Care Tool:  Bathing    Body parts bathed by patient: Right arm, Left arm, Chest, Abdomen, Right upper leg, Left upper leg, Right lower leg, Left lower leg, Face   Body parts bathed by helper: Front perineal area, Buttocks     Bathing assist Assist Level: Minimal Assistance - Patient > 75%     Upper Body Dressing/Undressing Upper body dressing   What is the patient wearing?: Pull over shirt    Upper  body assist Assist Level: Minimal Assistance - Patient > 75%    Lower Body Dressing/Undressing Lower body dressing      What is the patient wearing?: Pants     Lower body assist Assist for lower body dressing: Minimal Assistance - Patient > 75%     Toileting Toileting Toileting Activity did not occur (Clothing management and hygiene only): N/A (no void or bm)  Toileting assist       Transfers Chair/bed transfer  Transfers assist     Chair/bed transfer assist level: Minimal Assistance - Patient > 75%     Locomotion Ambulation   Ambulation assist      Assist level: Minimal Assistance - Patient > 75% Assistive device: Hand held  assist Max distance: 429   Walk 10 feet activity   Assist     Assist level: Minimal Assistance - Patient > 75% Assistive device: Hand held assist   Walk 50 feet activity   Assist    Assist level: Minimal Assistance - Patient > 75% Assistive device: Hand held assist    Walk 150 feet activity   Assist    Assist level: Minimal Assistance - Patient > 75% Assistive device: Hand held assist    Walk 10 feet on uneven surface  activity   Assist Walk 10 feet on uneven surfaces activity did not occur: Safety/medical concerns         Wheelchair     Assist Will patient use wheelchair at discharge?: No   Wheelchair activity did not occur: N/A         Wheelchair 50 feet with 2 turns activity    Assist    Wheelchair 50 feet with 2 turns activity did not occur: N/A       Wheelchair 150 feet activity     Assist  Wheelchair 150 feet activity did not occur: N/A       Blood pressure 128/84, pulse (!) 107, temperature 98.8 F (37.1 C), temperature source Oral, resp. rate 18, height 6\' 2"  (1.88 m), weight 63.2 kg, SpO2 100 %.    Medical Problem List and Plan: 1.  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  Antithrombotics: -DVT/anticoagulation:  Pharmaceutical: Lovenox             -antiplatelet therapy: ASA 3. Pain Management:  Oxycodone 4. Mood: LCSW to follow for evaluation and support.             -antipsychotic agents: N/A 5. Neuropsych: This patient is not capable of making decisions on his own behalf. 6. Skin/Wound Care:  Routine pressure-relief measures. -- Monitor wound for healing. 7. Fluids/Electrolytes/Nutrition: Monitor I's/O.eating 100% meals but poor po fluid intake cont cortrak for free H2O , d/c TF 8.  COVID PNA: To continue baricitinib for 1  additional day 9.  HTN: Monitor blood pressures 3 times daily.  Continue Coreg and hydralazine. Vitals:   11/27/20 1936 11/28/20 0421  BP: 129/90 128/84  Pulse: (!) 101 (!) 107  Resp: 18 18  Temp: 99.4 F (37.4 C) 98.8 F (37.1 C)  SpO2: 100% 100%    8/6 well controlled 10.  Dysphagia: Tolerating dysphagia 2,  thin liquids without aspiration.             --eating 70-100% 8/6 dc NGT. 11.  Anxiety/agitation: Will continue ativan prn. D/c haldol--last used 07/31. Continue SSI while on tube feeds.   -appears much improved.     LOS: 2 days A FACE TO FACE EVALUATION WAS PERFORMED  Ranelle Oyster 11/29/2020, 12:43 PM

## 2020-11-29 NOTE — Progress Notes (Signed)
NGT discontinued per MD order. Patient tolerated well.

## 2020-11-30 LAB — GLUCOSE, CAPILLARY
Glucose-Capillary: 105 mg/dL — ABNORMAL HIGH (ref 70–99)
Glucose-Capillary: 101 mg/dL — ABNORMAL HIGH (ref 70–99)
Glucose-Capillary: 88 mg/dL (ref 70–99)
Glucose-Capillary: 108 mg/dL — ABNORMAL HIGH (ref 70–99)

## 2020-11-30 MED ORDER — SODIUM CHLORIDE 0.9% FLUSH
10.0000 mL | INTRAVENOUS | Status: DC | PRN
  Administered 2020-12-01: 10 mL

## 2020-11-30 NOTE — Plan of Care (Signed)
Care plan reviewed with patient.

## 2020-11-30 NOTE — Progress Notes (Signed)
PROGRESS NOTE   Subjective/Complaints:  NGT out without issue. Continues to eat very well  ROS: Patient denies fever, rash, sore throat, blurred vision, nausea, vomiting, diarrhea, cough, shortness of breath or chest pain, joint or back pain, headache, or mood change.   Objective:   No results found. Recent Labs    11/28/20 0514  WBC 8.0  HGB 11.1*  HCT 33.8*  PLT 354   Recent Labs    11/28/20 0514  NA 134*  K 4.3  CL 101  CO2 26  GLUCOSE 125*  BUN 36*  CREATININE 1.20  CALCIUM 8.5*    Intake/Output Summary (Last 24 hours) at 11/30/2020 1037 Last data filed at 11/30/2020 3976 Gross per 24 hour  Intake 1317 ml  Output 2125 ml  Net -808 ml     Pressure Injury 11/14/20 Vertebral column Stage 1 -  Intact skin with non-blanchable redness of a localized area usually over a bony prominence. 3x4 area w/ scab on outside and pink center on bony prominince (Active)  11/14/20 0800  Location: Vertebral column  Location Orientation:   Staging: Stage 1 -  Intact skin with non-blanchable redness of a localized area usually over a bony prominence.  Wound Description (Comments): 3x4 area w/ scab on outside and pink center on bony prominince  Present on Admission: Yes    Physical Exam: Vital Signs Blood pressure 130/82, pulse 99, temperature 98.8 F (37.1 C), temperature source Oral, resp. rate 18, height 6\' 2"  (1.88 m), weight 58.2 kg, SpO2 100 %.  Constitutional: No distress . Vital signs reviewed. HEENT: NCAT, EOMI, oral membranes moist Neck: supple Cardiovascular: RRR without murmur. No JVD    Respiratory/Chest: CTA Bilaterally without wheezes or rales. Normal effort    GI/Abdomen: BS +, non-tender, non-distended Ext: no clubbing, cyanosis, or edema Psych: pleasant and cooperative   Skin: No evidence of breakdown, back wound, sternotomy incision CDI  Neurologic: Cranial nerves II through XII intact, motor strength  is 5/5 in bilateral deltoid, bicep, tricep, grip, hip flexor, knee extensors, ankle dorsiflexor and plantar flexor Sensory exam normal sensation to light touch and proprioception in bilateral upper and lower extremities Cerebellar exam normal finger to nose to finger as well as heel to shin in bilateral upper and lower extremities Musculoskeletal: Full range of motion in all 4 extremities. No joint swelling   Assessment/Plan: 1. Functional deficits which require 3+ hours per day of interdisciplinary therapy in a comprehensive inpatient rehab setting. Physiatrist is providing close team supervision and 24 hour management of active medical problems listed below. Physiatrist and rehab team continue to assess barriers to discharge/monitor patient progress toward functional and medical goals  Care Tool:  Bathing    Body parts bathed by patient: Right arm, Left arm, Chest, Abdomen, Right upper leg, Left upper leg, Right lower leg, Left lower leg, Face   Body parts bathed by helper: Front perineal area, Buttocks     Bathing assist Assist Level: Minimal Assistance - Patient > 75%     Upper Body Dressing/Undressing Upper body dressing   What is the patient wearing?: Pull over shirt    Upper body assist Assist Level: Minimal Assistance - Patient >  75%    Lower Body Dressing/Undressing Lower body dressing      What is the patient wearing?: Pants     Lower body assist Assist for lower body dressing: Minimal Assistance - Patient > 75%     Toileting Toileting Toileting Activity did not occur (Clothing management and hygiene only): N/A (no void or bm)  Toileting assist       Transfers Chair/bed transfer  Transfers assist     Chair/bed transfer assist level: Minimal Assistance - Patient > 75%     Locomotion Ambulation   Ambulation assist      Assist level: Minimal Assistance - Patient > 75% Assistive device: Hand held assist Max distance: 429   Walk 10 feet  activity   Assist     Assist level: Minimal Assistance - Patient > 75% Assistive device: Hand held assist   Walk 50 feet activity   Assist    Assist level: Minimal Assistance - Patient > 75% Assistive device: Hand held assist    Walk 150 feet activity   Assist    Assist level: Minimal Assistance - Patient > 75% Assistive device: Hand held assist    Walk 10 feet on uneven surface  activity   Assist Walk 10 feet on uneven surfaces activity did not occur: Safety/medical concerns         Wheelchair     Assist Will patient use wheelchair at discharge?: No   Wheelchair activity did not occur: N/A         Wheelchair 50 feet with 2 turns activity    Assist    Wheelchair 50 feet with 2 turns activity did not occur: N/A       Wheelchair 150 feet activity     Assist  Wheelchair 150 feet activity did not occur: N/A       Blood pressure 130/82, pulse 99, temperature 98.8 F (37.1 C), temperature source Oral, resp. rate 18, height 6\' 2"  (1.88 m), weight 58.2 kg, SpO2 100 %.    Medical Problem List and Plan: 1.  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  Antithrombotics: -DVT/anticoagulation:  Pharmaceutical: Lovenox             -antiplatelet therapy: ASA 3. Pain Management:  Oxycodone 4. Mood: LCSW to follow for evaluation and support.             -antipsychotic agents: N/A 5. Neuropsych: This patient is not capable of making decisions on his own behalf. 6. Skin/Wound Care:  Routine pressure-relief measures. -- Monitor wound for healing. 7. Fluids/Electrolytes/Nutrition: Monitor I's/O.eating 100% meals but poor po fluid intake cont cortrak for free H2O , d/c TF 8.  COVID PNA: To continue baricitinib for 1 additional day 9.  HTN: Monitor blood pressures  3 times daily.  Continue Coreg and hydralazine. Vitals:   11/29/20 1432 11/29/20 1809  BP: 111/77 130/82  Pulse:  99  Resp:    Temp:    SpO2:      8/6 well controlled 10.  Dysphagia: Tolerating dysphagia 2, thin liquids without aspiration.             --eating 100%  NGT removed 8/6---check labs tomorrow 11.  Anxiety/agitation: Will continue ativan prn. D/c haldol--last used 07/31. Continue SSI while on tube feeds.   -appears much improved.     LOS: 3 days A FACE TO FACE EVALUATION WAS PERFORMED  Ranelle Oyster 11/30/2020, 10:37 AM

## 2020-12-01 LAB — BASIC METABOLIC PANEL
Anion gap: 9 (ref 5–15)
BUN: 23 mg/dL — ABNORMAL HIGH (ref 6–20)
CO2: 26 mmol/L (ref 22–32)
Calcium: 8.8 mg/dL — ABNORMAL LOW (ref 8.9–10.3)
Chloride: 101 mmol/L (ref 98–111)
Creatinine, Ser: 1.18 mg/dL (ref 0.61–1.24)
GFR, Estimated: >60 mL/min (ref >60)
Glucose, Bld: 98 mg/dL (ref 70–99)
Potassium: 4.1 mmol/L (ref 3.5–5.1)
Sodium: 136 mmol/L (ref 135–145)

## 2020-12-01 LAB — GLUCOSE, CAPILLARY
Glucose-Capillary: 108 mg/dL — ABNORMAL HIGH (ref 70–99)
Glucose-Capillary: 108 mg/dL — ABNORMAL HIGH (ref 70–99)
Glucose-Capillary: 102 mg/dL — ABNORMAL HIGH (ref 70–99)
Glucose-Capillary: 100 mg/dL — ABNORMAL HIGH (ref 70–99)

## 2020-12-01 LAB — CBC
HCT: 33.4 % — ABNORMAL LOW (ref 39.0–52.0)
Hemoglobin: 10.5 g/dL — ABNORMAL LOW (ref 13.0–17.0)
MCH: 31.5 pg (ref 26.0–34.0)
MCHC: 31.4 g/dL (ref 30.0–36.0)
MCV: 100.3 fL — ABNORMAL HIGH (ref 80.0–100.0)
Platelets: 278 10*3/uL (ref 150–400)
RBC: 3.33 MIL/uL — ABNORMAL LOW (ref 4.22–5.81)
RDW: 13.6 % (ref 11.5–15.5)
WBC: 6.2 10*3/uL (ref 4.0–10.5)
nRBC: 0 % (ref 0.0–0.2)

## 2020-12-01 MED ORDER — ACETAMINOPHEN 325 MG PO TABS: 325.0000 mg | ORAL_TABLET | ORAL | Status: DC | PRN

## 2020-12-01 NOTE — Progress Notes (Signed)
Physical Therapy Session Note  Patient Details  Name: Jesse [name] MRN: [id number] Date of Birth: 04-17-1976  Today's Date: 12/01/2020 PT Individual Time: 0933-1015 PT Individual Time Calculation (min): 42 min   Short Term Goals: Week 1:  PT Short Term Goal 1 (Week 1): STG = LTG due to ELOS  Skilled Therapeutic Interventions/Progress Updates:  Patient supine in bed on entrance to room. Patient alert and agreeable to PT session. Patient denied pain throughout session.  Therapeutic Activity: Bed Mobility: Patient performed supine <> sit with Mod I. No cues required for performance. While seated EOB, pt provided with pants and asked pt to don himself. He threads arms into pant legs. Pt corrected and shown how to don to each foot, then pull up pants with pt quickly taking over and pulling up to highest point. Then able to perform STS with CGA to complete donning.  Transfers: Patient performed STS and SPVT transfers throughout session with close supervision/ CGA. Provided minimal verbal cues for initiation, reaching back for w/c.  Neuromuscular Re-ed: NMR facilitated during session with focus on following instructions/ cognition, standing balance. Pt guided in start drill  with toe tapping to  6" cones. Pt able to follow instruction with verbal cue and visual demonstration prior to performance. Decreased accuracy with RLE that improves with practice. Pt then guided in static balance training with stepping onto blue foam wedge. Pt having difficulty initially with overcoming posterior bias and able to attain with Min A and hold with CGA for . Blocked practice in stepping over 6" obstacles of small hurdles and cones requiring overall CGA. Improves with repetition and vc/ tc for balance shift and increased step height bilaterally with special focus on trailing LE. Static balance reaching task at end of session proving difficult as focus has shifted to performance with UE. Verbal instructions provided  initially with pt attempting to step forward to tablet with each attempt. Requires hand over hand assist to correctly complete initially as pt continues to move feet to initiate task despite multimodal cues for UE use. Then is able to complete correctly with visual  and verbal cues.   NMR performed for improvements in motor control and coordination, balance, sequencing, judgement, and self confidence/ efficacy in performing all aspects of mobility at highest level of independence.   Patient supine  in bed at end of session with brakes locked, bed alarm set, and all needs within reach.     Therapy Documentation Precautions:  Precautions Precautions: Sternal, Fall Precaution Comments: cortrak Restrictions Weight Bearing Restrictions: No RUE Weight Bearing: Weight bearing as tolerated LUE Weight Bearing: Weight bearing as tolerated Other Position/Activity Restrictions: sternal  Therapy/Group: Individual Therapy  Loel Dubonnet PT, DPT 12/01/2020, 5:30 PM

## 2020-12-01 NOTE — Progress Notes (Signed)
Patient alert and  confused. Urinary catheterization performed this shift with of clear yellow urine obtained. Bladder remains soft and non distended.

## 2020-12-01 NOTE — Progress Notes (Signed)
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 Recent Labs    12/01/20 0334  WBC 6.2  HGB 10.5*  HCT 33.4*  PLT 278    Recent Labs    12/01/20 0334  NA 136  K 4.1  CL 101  CO2 26  GLUCOSE 98  BUN 23*  CREATININE 1.18  CALCIUM 8.8*     Intake/Output Summary (Last 24 hours) at 12/01/2020 0916 Last data filed at 12/01/2020 0440 Gross per 24 hour  Intake 980 ml  Output 1950 ml  Net -970 ml      Pressure Injury 11/14/20 Vertebral column Stage 1 -  Intact skin with non-blanchable redness of a localized area usually over a bony prominence. 3x4 area w/ scab on outside and pink center on bony prominince (Active)  11/14/20 0800  Location: Vertebral column  Location Orientation:   Staging: Stage 1 -  Intact skin with non-blanchable redness of a localized area usually over a bony prominence.  Wound Description (Comments): 3x4 area w/ scab on outside and pink center on bony prominince  Present on Admission: Yes    Physical Exam: Vital Signs Blood pressure 120/85, pulse 89, temperature 98.8 F (37.1 C), temperature source Oral, resp. rate 18, height 6\' 2"  (1.88 m), weight 58.4 kg, SpO2 100 %.   General: No acute distress Mood and affect are appropriate Heart: Regular rate and rhythm no rubs murmurs or extra sounds Lungs: Clear to auscultation, breathing unlabored, no rales or wheezes Abdomen: Positive bowel sounds, soft nontender to palpation, nondistended Extremities: No clubbing, cyanosis, or edema Skin: No evidence of breakdown, no evidence of rash   Psych: pleasant and cooperative   Skin: No evidence of breakdown, back wound, sternotomy incision CDI  Neurologic: Cranial nerves II through XII intact, motor strength is 5/5 in bilateral deltoid, bicep, tricep, grip,  hip flexor, knee extensors, ankle dorsiflexor and plantar flexor Sensory exam normal sensation to light touch and proprioception in bilateral upper and lower extremities Cerebellar exam normal finger to nose to finger as well as heel to shin in bilateral upper and lower extremities Musculoskeletal: Full range of motion in all 4 extremities. No joint swelling   Assessment/Plan: 1. Functional deficits which require 3+ hours per day of interdisciplinary therapy in a comprehensive inpatient rehab setting. Physiatrist is providing close team supervision and 24 hour management of active medical problems listed below. Physiatrist and rehab team continue to assess barriers to discharge/monitor patient progress toward functional and medical goals  Care Tool:  Bathing    Body parts bathed by patient: Right arm, Left arm, Chest, Abdomen, Right upper leg, Left upper leg, Right lower leg, Left lower leg, Face   Body parts bathed by helper: Front perineal area, Buttocks     Bathing assist Assist Level: Minimal Assistance - Patient > 75%     Upper Body Dressing/Undressing Upper body dressing   What is the patient wearing?: Pull over shirt    Upper body assist Assist Level: Minimal Assistance - Patient > 75%    Lower  Body Dressing/Undressing Lower body dressing      What is the patient wearing?: Pants     Lower body assist Assist for lower body dressing: Minimal Assistance - Patient > 75%     Toileting Toileting Toileting Activity did not occur (Clothing management and hygiene only): N/A (no void or bm)  Toileting assist       Transfers Chair/bed transfer  Transfers assist     Chair/bed transfer assist level: Minimal Assistance - Patient > 75%     Locomotion Ambulation   Ambulation assist      Assist level: Minimal Assistance - Patient > 75% Assistive device: Hand held assist Max distance: 429   Walk 10 feet activity   Assist     Assist level: Minimal  Assistance - Patient > 75% Assistive device: Hand held assist   Walk 50 feet activity   Assist    Assist level: Minimal Assistance - Patient > 75% Assistive device: Hand held assist    Walk 150 feet activity   Assist    Assist level: Minimal Assistance - Patient > 75% Assistive device: Hand held assist    Walk 10 feet on uneven surface  activity   Assist Walk 10 feet on uneven surfaces activity did not occur: Safety/medical concerns         Wheelchair     Assist Will patient use wheelchair at discharge?: No   Wheelchair activity did not occur: N/A         Wheelchair 50 feet with 2 turns activity    Assist    Wheelchair 50 feet with 2 turns activity did not occur: N/A       Wheelchair 150 feet activity     Assist  Wheelchair 150 feet activity did not occur: N/A       Blood pressure 120/85, pulse 89, temperature 98.8 F (37.1 C), temperature source Oral, resp. rate 18, height 6\' 2"  (1.88 m), weight 58.4 kg, SpO2 100 %.    Medical Problem List and Plan: 1.  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  Antithrombotics: -DVT/anticoagulation:  Pharmaceutical: Lovenox             -antiplatelet therapy: ASA 3. Pain Management:  Oxycodone 4. Mood: LCSW to follow for evaluation and support.             -antipsychotic agents: N/A 5. Neuropsych: This patient is not capable of making decisions on his own behalf. 6. Skin/Wound Care:  Routine pressure-relief measures. -- Monitor wound for healing. 7. Fluids/Electrolytes/Nutrition: Monitor I's/O.eating 100% meals but poor po fluid intake  8.  COVID PNA: To continue baricitinib for 1 additional day 9.  HTN: Monitor blood pressures 3 times daily.  Continue Coreg and hydralazine. Vitals:   11/29/20 1809 11/30/20  1416  BP: 130/82 120/85  Pulse: 99 89  Resp:    Temp:    SpO2:      8/8 well controlled 10.  Dysphagia: Tolerating dysphagia 2, thin liquids without aspiration.             --eating 100%   NGT removed 8/6---BMET normal on 8/8 11.  Anxiety/agitation: Will  continue ativan prn. D/c haldol--last used 07/31. Continue SSI while on tube feeds.   -appears much improved.     LOS: 4 days A FACE TO FACE EVALUATION WAS PERFORMED  Erick Colace 12/01/2020, 9:16 AM

## 2020-12-01 NOTE — Progress Notes (Signed)
Speech Language Pathology Daily Session Note  Patient Details  Name: Jesse [name] MRN: [id number] Date of Birth: 22-Sep-1975  Today's Date: 12/01/2020 SLP Individual Time: 1093-2355 SLP Individual Time Calculation (min): 45 min  Short Term Goals: Week 1: SLP Short Term Goal 1 (Week 1): Patient will follow one step verbal directions with maxA cues. SLP Short Term Goal 2 (Week 1): Patient will tolerate trials of Dys 3 solids without significant oral phase difficulty with modA. SLP Short Term Goal 3 (Week 1): Patient will point to common objects in field of two with 60% accuracy and maxA cues. SLP Short Term Goal 4 (Week 1): Patient will perform basic level functional tasks (brush teeth, feed self, wash up, etc) with setup and modA cues. SLP Short Term Goal 5 (Week 1): Patient will indicate basic level needs via multi-modal communication (verbal, visual, gestural) with maxA cues.  Skilled Therapeutic Interventions:Skilled SLP intervention focused on cognition. Pt followed one step verbal commands during functional task eating snack with mod A verbal cues. He often responded to questions with fluent speech however responses were unrelated to questions. He was unable to follow simple oral motor commands with visual models. Automatic speech task with pt counting 1-10 completed with mod A then cues faded to min A. Pt repeated 2/10 simple CV combinations. He made attempts and often produced other common/familiar words or names such as "Robbert" "tom" "pen." Improved responses to greetings and everyday responses such as "thank you" "im sorry" "that's it" "its good". Pt completed trials with dys 3 snack and end of session. He demonstrated decreased mastication with dys 3 solids and needed sips of thin liquid to increased oral clearance. No overt s/sx of aspiration or penetration noted with snack trials . Cont with therapy per plan of care.      Pain Pain Assessment Pain Scale: Faces Faces Pain Scale: No  hurt  Therapy/Group: Individual Therapy  Carlean Jews Geanna Divirgilio 12/01/2020, 8:38 AM

## 2020-12-01 NOTE — Progress Notes (Signed)
Occupational Therapy Session Note  Patient Details  Name: Jesse [name] MRN: [id number] Date of Birth: 05/01/1975  Today's Date: 12/01/2020 OT Individual Time: 3893-7342 OT Individual Time Calculation (min): 55 min    Short Term Goals: Week 1:  OT Short Term Goal 1 (Week 1): STGs=LTGs due to ELOS  Skilled Therapeutic Interventions/Progress Updates:   Pt greeted supine in bed agreeable to session. Pt currently requires CGA for bed mobility and CGA for stand pivot transfer to w/c with no AD. Session focus on therapeutic activities focused on cognition and dynamic standing balance as well as BADL reeducation. Pt transported to quiet day room with totalA. Worked on simple matching task where pt able to match number of animals to number on card. When asked "is that number 1 or 7?" Pt answered correctly ~ 40% of time. Pt easily frustrated repeating phrases such as "why am I like this?"  Switched gears to focus on dynamic standing balance via bean bag toss with pt completing task with CGA, attempted to add in cog component of having pt reach for certain colors however pt with no carryover of colors. Pt was able to reach down to retrieve bean bags with MIN A. Pt transported back to room as pt fixated on "not being able to see." Pt completed BADL retraining tasks such as washing face and applying lotion, MAX multimodal cues needed to sequence steps. Played music from pts teenage era with pt noted to sing along to words of "[place]" by [employer]asis, music noted to calm pt down and pt tapping feet and nodding along to beat. Pt left supine in bed with bed alarm activated and all needs within reach.   Therapy Documentation Precautions:  Precautions Precautions: Sternal, Fall Precaution Comments: cortrak Restrictions Weight Bearing Restrictions: No RUE Weight Bearing: Weight bearing as tolerated LUE Weight Bearing: Weight bearing as tolerated Other Position/Activity Restrictions: sternal    Pain: No  reports or indications of pain during session.    Therapy/Group: Individual Therapy  Barron Schmid 12/01/2020, 12:12 PM

## 2020-12-01 NOTE — Progress Notes (Signed)
Patient ID: Jesse [name], male   DOB: 05/11/1975, 45 y.o.   MRN: [id number] Follow up with the patient and his father regarding role of the nurse CM and introduce self. Discussed current status with incision to abd and chest, stage 1 to back. Reviewed D2 diet and low protein stores. Discussed food options to increase protein intake. Patient does not like ensure drinks or vanilla magic cups. Reports he does not feel the need to restart smoking; quitting "shouldn't be a problem". Continue to follow along to discharge to address educational needs and skin care. Collaborate with the SW to facilitate preparation for discharge. Pamelia Hoit

## 2020-12-01 NOTE — Progress Notes (Signed)
Occupational Therapy Session Note  Patient Details  Name: Jesse [name] MRN: [id number] Date of Birth: 04-10-1976  Today's Date: 12/01/2020 OT Individual Time: 1431-1458 OT Individual Time Calculation (min): 27 min    Short Term Goals: Week 1:  OT Short Term Goal 1 (Week 1): STGs=LTGs due to ELOS  Skilled Therapeutic Interventions/Progress Updates:    Pt greeted supine in bed with pts father present. Utilized this opportunity to update pts family member on pts progress and provided education on aphasia. Pts father [name]) reports that pt enjoys Hockey ( [place] hurricanes) and soccer. Tom agreeable to bring in pictures of pts family as well as pts reading glasses and some of pts clothes. Remainder of session to focus on therapeutic activities related to command following and cognition. Pt able to work on bilateral integration task of Manufacturing engineer structures with initial hand over hand assist but progressing to supervision with MOD cues. Pt able to recognize [employer] when visual aid provided with max cues such as "is this football or hockey?" Pt left supine in bed with bed alarm activated and all needs within reach.   Therapy Documentation Precautions:  Precautions Precautions: Sternal, Fall Precaution Comments: cortrak Restrictions Weight Bearing Restrictions: No RUE Weight Bearing: Weight bearing as tolerated LUE Weight Bearing: Weight bearing as tolerated Other Position/Activity Restrictions: sternal  Pain: Pt with no indications of pain during session.    Therapy/Group: Individual Therapy  Barron Schmid 12/01/2020, 3:59 PM

## 2020-12-02 LAB — GLUCOSE, CAPILLARY
Glucose-Capillary: 106 mg/dL — ABNORMAL HIGH (ref 70–99)
Glucose-Capillary: 142 mg/dL — ABNORMAL HIGH (ref 70–99)
Glucose-Capillary: 96 mg/dL (ref 70–99)
Glucose-Capillary: 85 mg/dL (ref 70–99)

## 2020-12-02 NOTE — Progress Notes (Signed)
In and Out catheterization performed at this as ordered if patient does not void in 4-6 hrs. Performed  via sterile technique with of clear yellow urine obtained.

## 2020-12-02 NOTE — Progress Notes (Signed)
Physical Therapy Session Note  Patient Details  Name: Jesse [name] MRN: [id number] Date of Birth: 1976/04/20  Today's Date: 12/02/2020 PT Individual Time: 0804-0918 PT Individual Time Calculation (min): 74 min   Short Term Goals: Week 1:  PT Short Term Goal 1 (Week 1): STG = LTG due to ELOS  Skilled Therapeutic Interventions/Progress Updates:  Patient supine in bed on entrance to room. RN present to provide morning medications. Patient alert and agreeable to PT session. Patient denied pain during session.  Therapeutic Activity: Bed Mobility: Patient performed supine <> sit with supervision/ mod I. Supervision for performance with correct timing and safety. While seated, noted that pt's shirt is dirty and asked pt to remove shirt and replace with new shirt. Pt makes move to stand several times with therapist responding with "no, take off your shirt" while increasing additional visual and tactile cues for attention to pt's shirt. Pt gets frustrated and responds with "I don't know what you want me to do." Pt allowed to stand and remove pants. Provided with new pants. Then provided with shirt and pt then able to doff old shirt and on new one.  Transfers: Patient performed STS and SPVT transfers throughout session with  with supervision for balance and positioning/ safety. No vc required for technique.  Gait Training:  Patient ambulated >300 feet using no AD with CGA/ Min A intermittently for balance. Demonstrated crossover stepping for adjustments in balance during gait. Provided vc/ tc for directions and tc for balance throughout. Pt challenged with ambulation up/ down flight of stairs in building and is able to reciprocate steps up to next floor using R railing for UE support throughout. Also reciprocates steps down with R railing. Completes with CGA and noted increased resp rate that quickly returns to resting rate with seated rest following effort.    Neuromuscular Re-ed: NMR facilitated  during session with focus on standing balance, dual tasking, attention/ focus on task, following instructions, sequencing of numbers. Pt guided in use of BITS to follow and track ball movement over 100% of screen. Pt continuously stepping closer and closer to screen making it difficult to ascertain large changes in placement of targets and requires vc/ tc to step back for improved visualization of entire field. Pt demos some difficulty with pinpointing directly to target requiring some time and correction in coordination of arm/ hand for accurate hit of target. Pt has glasses donned but is looking over tops of glasses as through they are reading glasses.   Balance challenged with standing on Airex pad as well as performing forward lunges with step onto Airex pad. Good balance noted throughout with no UE support. Pt also guided in standing match of colored bean bags to matching color targets on table placed anterior to pt and just at edge of BOS. Pt able to correctly locate 50% of called color and requires vc/ tc to correctly match ~50% of attempts.  NMR performed for improvements in motor control and coordination, balance, sequencing, judgement, and self confidence/ efficacy in performing all aspects of mobility at highest level of independence.   Patient supine  in bed at end of session with brakes locked, bed alarm set, and all needs within reach.     Therapy Documentation Precautions:  Precautions Precautions: Sternal, Fall Precaution Comments: cortrak Restrictions Weight Bearing Restrictions: No RUE Weight Bearing: Weight bearing as tolerated LUE Weight Bearing: Weight bearing as tolerated Other Position/Activity Restrictions: sternal  Therapy/Group: Individual Therapy  Loel Dubonnet PT, DPT 12/02/2020, 8:11 AM

## 2020-12-02 NOTE — Progress Notes (Signed)
Occupational Therapy Session Note  Patient Details  Name: Cayden Granholm MRN: [id number] Date of Birth: 01/31/1976  Today's Date: 12/02/2020 OT Individual Time: 1005-1059 OT Individual Time Calculation (min): 54 min    Short Term Goals: Week 1:  OT Short Term Goal 1 (Week 1): STGs=LTGs due to ELOS  Skilled Therapeutic Interventions/Progress Updates:    Pt received semi-reclined in bed, no s/sx pain throughout session, agreeable to therapy. Session focus on self-care retraining, activity tolerance, command follow, problem solving, L visual scanning, static standing balance in prep for improved ADL/IADL/func mobility performance + decreased caregiver burden.  Pt offered change of clothes as he was still in scrubs, pt attempting to don pants over exisiting pants and req max verbal and visual cues to doff scrub pants prior to donning new ones. Req min A to thread B feet for initiation. Declined changing shirt. Offered mask and pt attempted to thread mask over legs like pants, ultimately req max A to don correctly on face. Stand-pivot to w/c throughout session with CGA and no AD, visual cues to indicate where to sit. Pt becoming easily frustrated, repeating "this is bullshit" and " I don't know what you want me to do with dressing tasks. Total A w/c transport to and from gym 2/2 time management and energy conservation. Seated, completed 2 pipe designs, but req step by step verbal and visual cues for each step. Pt was able to sort and retrieve bean bags by colors with minimal cuing, but  then req max cuing and demonstration to copy block designs. Standing at [facility], completed single target game with 66% accuracy and  4.32 sec RT. Req CGA for balance throughout, but only very minimal cues for locating targets. Finaly, completed Bell Cancellation task while seated, but req step-by-step cuing to locate and hit targets.  In room amb to and from toilet with no AD and CGA for balance. Total A for LB clothing  management and posterior pericare, no void but small smear of incontinent BM noted in brief. Amb back to bed and returned to supine with close S. Noted to have family picture album in room, pt able to verbalize people's names and became tearful stating " I need to get my mind right so I cant see them."   Pt left in bed with HOB > 30 degrees, RN present,  with bed alarm engaged, call bell in reach, and all immediate needs met.    Therapy Documentation Precautions:  Precautions Precautions: Sternal, Fall Precaution Comments: cortrak Restrictions Weight Bearing Restrictions: No RUE Weight Bearing: Weight bearing as tolerated LUE Weight Bearing: Weight bearing as tolerated Other Position/Activity Restrictions: sternal  Pain:  No s/sx throughout ADL: See Care Tool for more details. Therapy/Group: Individual Therapy  Volanda Napoleon MS, OTR/L  12/02/2020, 6:47 AM

## 2020-12-02 NOTE — Progress Notes (Signed)
Nutrition Follow-up  DOCUMENTATION CODES:   Severe malnutrition in context of chronic illness, Underweight  INTERVENTION:  Continue Ensure Enlive po BID, each supplement provides 350 kcal and 20 grams of protein  Encourage adequate PO intake.   NUTRITION DIAGNOSIS:   Severe Malnutrition related to chronic illness as evidenced by severe fat depletion, severe muscle depletion; ongoing  GOAL:   Patient will meet greater than or equal to 90% of their needs; met  MONITOR:   PO intake, Supplement acceptance, Skin, Weight trends, Labs, I & O's, Diet advancement  REASON FOR ASSESSMENT:   Consult Calorie Count  ASSESSMENT:   45 year old male with history of HTN originally admitted to [facility] on 11/04/2020 with aphasia, blurry vision in right eye and mental status changes. He was found to have aortic dissection extending into the right common carotid with narrowing of the true lumen as well as occlusion of small distal right M2 and proximal M3 MCA branch. He underwent emergent repair of ascending thoracic aortic dissection with resuspension of native aortic valve. Follow-up CT on 07/20 showed interval development of petechial hemorrhage with evolving posterior right MCA and PCA MCA infarct. Hospital course was significant for issues with severe agitation due to delirium as well as poor safety awareness with impulsivity. CIR was recommended due to functional decline. 07/22 he was noted to be hypotensive and tachycardic with agonal breathing.  CODE BLUE initiated and he was briefly intubated and noted to have aspiration PNA and COVID positive. Cortak placed due to poor p.o. intake and for nutritional support secondary to severe malnutrition. Diet advanced to dysphagia 2 diet with thin liquids. Cortrak removed 8/6.   Meal completion has been 100%. Pt has been tolerating his po diet well. Pt currently has Ensure ordered and has been consuming them. RD to continue with current orders to aid in caloric  and protein needs. RD to discontinue calorie count. Pt meeting nutrition needs.   72 hour calorie count results: 8/5- 2342 kcal (100% of kcal needs), 104 grams of protein (90% of protein needs) 8/6- 2208 kcal (100% of kcal needs), 103 grams of protein (90% of protein needs) 8/7- 2402 kcal (100% of kcal needs), 118 grams of protein (100% of protein needs)  NUTRITION - FOCUSED PHYSICAL EXAM:  Flowsheet Row Most Recent Value  Orbital Region Unable to assess  Upper Arm Region Severe depletion  Thoracic and Lumbar Region Moderate depletion  Buccal Region Unable to assess  Temple Region Unable to assess  Clavicle Bone Region Severe depletion  Clavicle and Acromion Bone Region Severe depletion  Scapular Bone Region Unable to assess  Dorsal Hand Unable to assess  Patellar Region Severe depletion  Anterior Thigh Region Severe depletion  Posterior Calf Region Severe depletion  Edema (RD Assessment) None  Hair Reviewed  Eyes Reviewed  Mouth Reviewed  Skin Reviewed  Nails Reviewed      Labs and medications reviewed.   Diet Order:   Diet Order             DIET DYS 2 Room service appropriate? No; Fluid consistency: Thin  Diet effective now                   EDUCATION NEEDS:   Not appropriate for education at this time  Skin:  Skin Assessment: Reviewed RN Assessment Skin Integrity Issues:: Stage I Stage I: vertebral column  Last BM:  8/6  Height:   Ht Readings from Last 1 Encounters:  11/27/20 6' 2"  (1.88 m)  Weight:   Wt Readings from Last 1 Encounters:  12/02/20 58.6 kg    Ideal Body Weight:  86.36 kg  BMI:  Body mass index is 16.59 kg/m.  Estimated Nutritional Needs:   Kcal:  2100-2300  Protein:  115-130 grams  Fluid:  >/= 2 L/day  Corrin Parker, MS, RD, LDN RD pager number/after hours weekend pager number on Amion.

## 2020-12-02 NOTE — Progress Notes (Signed)
PROGRESS NOTE   Subjective/Complaints:  Remains with fluent aphasia , severe   ROS: cannot obtain due to aphasia   Objective:   No results found. Recent Labs    12/01/20 0334  WBC 6.2  HGB 10.5*  HCT 33.4*  PLT 278    Recent Labs    12/01/20 0334  NA 136  K 4.1  CL 101  CO2 26  GLUCOSE 98  BUN 23*  CREATININE 1.18  CALCIUM 8.8*     Intake/Output Summary (Last 24 hours) at 12/02/2020 0848 Last data filed at 12/02/2020 0735 Gross per 24 hour  Intake 990 ml  Output 2425 ml  Net -1435 ml      Pressure Injury 11/14/20 Vertebral column Stage 1 -  Intact skin with non-blanchable redness of a localized area usually over a bony prominence. 3x4 area w/ scab on outside and pink center on bony prominince (Active)  11/14/20 0800  Location: Vertebral column  Location Orientation:   Staging: Stage 1 -  Intact skin with non-blanchable redness of a localized area usually over a bony prominence.  Wound Description (Comments): 3x4 area w/ scab on outside and pink center on bony prominince  Present on Admission: Yes    Physical Exam: Vital Signs Blood pressure 130/89, pulse 89, temperature 98.5 F (36.9 C), temperature source Oral, resp. rate 14, height 6\' 2"  (1.88 m), weight 58.6 kg, SpO2 100 %.  General: No acute distress Mood and affect are appropriate Heart: Regular rate and rhythm no rubs murmurs or extra sounds Lungs: Clear to auscultation, breathing unlabored, no rales or wheezes Abdomen: Positive bowel sounds, soft nontender to palpation, nondistended Extremities: No clubbing, cyanosis, or edema  Psych: pleasant and cooperative   Skin: No evidence of breakdown, back wound, sternotomy incision CDI  Neurologic: Cranial nerves II through XII intact, motor strength is 5/5 in bilateral deltoid, bicep, tricep, grip, hip flexor, knee extensors, ankle dorsiflexor and plantar flexor Sensory exam normal sensation to  light touch and proprioception in bilateral upper and lower extremities Cerebellar exam normal finger to nose to finger as well as heel to shin in bilateral upper and lower extremities Musculoskeletal: Full range of motion in all 4 extremities. No joint swelling   Assessment/Plan: 1. Functional deficits which require 3+ hours per day of interdisciplinary therapy in a comprehensive inpatient rehab setting. Physiatrist is providing close team supervision and 24 hour management of active medical problems listed below. Physiatrist and rehab team continue to assess barriers to discharge/monitor patient progress toward functional and medical goals  Care Tool:  Bathing    Body parts bathed by patient: Face   Body parts bathed by helper: Front perineal area, Buttocks     Bathing assist Assist Level: Contact Guard/Touching assist (standing at sink)     Upper Body Dressing/Undressing Upper body dressing   What is the patient wearing?: Pull over shirt    Upper body assist Assist Level: Minimal Assistance - Patient > 75%    Lower Body Dressing/Undressing Lower body dressing      What is the patient wearing?: Pants     Lower body assist Assist for lower body dressing: Minimal Assistance - Patient > 75%  Toileting Toileting Toileting Activity did not occur Press photographer and hygiene only): N/A (no void or bm)  Toileting assist Assist for toileting: Minimal Assistance - Patient > 75%     Transfers Chair/bed transfer  Transfers assist     Chair/bed transfer assist level: Minimal Assistance - Patient > 75%     Locomotion Ambulation   Ambulation assist      Assist level: Minimal Assistance - Patient > 75% Assistive device: Hand held assist Max distance: 429   Walk 10 feet activity   Assist     Assist level: Minimal Assistance - Patient > 75% Assistive device: Hand held assist   Walk 50 feet activity   Assist    Assist level: Minimal Assistance -  Patient > 75% Assistive device: Hand held assist    Walk 150 feet activity   Assist    Assist level: Minimal Assistance - Patient > 75% Assistive device: Hand held assist    Walk 10 feet on uneven surface  activity   Assist Walk 10 feet on uneven surfaces activity did not occur: Safety/medical concerns         Wheelchair     Assist Will patient use wheelchair at discharge?: No   Wheelchair activity did not occur: N/A         Wheelchair 50 feet with 2 turns activity    Assist    Wheelchair 50 feet with 2 turns activity did not occur: N/A       Wheelchair 150 feet activity     Assist  Wheelchair 150 feet activity did not occur: N/A       Blood pressure 130/89, pulse 89, temperature 98.5 F (36.9 C), temperature source Oral, resp. rate 14, height 6\' 2"  (1.88 m), weight 58.6 kg, SpO2 100 %.    Medical Problem List and Plan: 1.  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  Antithrombotics: -DVT/anticoagulation:  Pharmaceutical: Lovenox             -antiplatelet therapy: ASA 3. Pain Management:  Oxycodone 4. Mood: LCSW to follow for evaluation and support.             -antipsychotic agents: N/A 5. Neuropsych: This patient is not capable of making decisions on his own behalf. 6. Skin/Wound Care:  Routine pressure-relief measures. -- Monitor wound for healing. 7. Fluids/Electrolytes/Nutrition: Monitor I's/O.eating 100% meals but poor po fluid intake  8.  COVID PNA: To continue baricitinib for 1 additional day 9.  HTN: Monitor blood pressures 3 times daily.  Continue Coreg and hydralazine. Vitals:   12/01/20 2025 12/02/20 0514  BP: 125/81 130/89  Pulse:    Resp:    Temp:    SpO2:      8/9 well controlled 10.  Dysphagia: Tolerating  dysphagia 2, thin liquids without aspiration.             --eating 100%   NGT removed 8/6---BMET normal on 8/8 11.  Anxiety/agitation: Will continue ativan prn. D/c haldol--last used 07/31. Continue SSI while on tube feeds.   -appears much improved.     LOS: 5 days A FACE  TO FACE EVALUATION WAS PERFORMED  Erick Colace 12/02/2020, 8:48 AM

## 2020-12-02 NOTE — Progress Notes (Signed)
Speech Language Pathology Daily Session Note  Patient Details  Name: Jesse [name] MRN: [id number] Date of Birth: 07-07-1975  Today's Date: 12/02/2020 SLP Individual Time: 1115-1200 SLP Individual Time Calculation (min): 45 min  Short Term Goals: Week 1: SLP Short Term Goal 1 (Week 1): Patient will follow one step verbal directions with maxA cues. SLP Short Term Goal 2 (Week 1): Patient will tolerate trials of Dys 3 solids without significant oral phase difficulty with modA. SLP Short Term Goal 3 (Week 1): Patient will point to common objects in field of two with 60% accuracy and maxA cues. SLP Short Term Goal 4 (Week 1): Patient will perform basic level functional tasks (brush teeth, feed self, wash up, etc) with setup and modA cues. SLP Short Term Goal 5 (Week 1): Patient will indicate basic level needs via multi-modal communication (verbal, visual, gestural) with maxA cues.  Skilled Therapeutic Interventions: Patient agreeable to skilled ST intervention with focus on expressive/receptive language. SLP facilitated automatic speech tasks. Produced counting 1-10 with 60% accuracy (1-6 only) with max A verbal/visual/tactile cues fading to mod A. Exhibited approximations only with days of the week. Singing "happy birthday" with approximately 75% accuracy with max A fading to mod A verbal/tactile cues. SLP implemented modified melodic intonation with right hand tapping and intoning during automatic speech tasks. Patient exhibited significant difficulty with imitation of consonants, vowels, and verbal repetition at single word, single and two syllable word level. Patient successfully pointed to field of 2 pictures with 80% accuracy and mod A verbal/visual cues. Similar to observations yesterday, his spontaneous speech often consisted of common names "Jesse [name], etc.", phonemic and literal paraphasias. Patient appeared anxious and emotional throughout session and often perseverated on the phrase "I  don'Jesse know what's wrong." SLP provided emotional support and education on speech/language deficits following CVA. Patient appeared appreciative and often expressed "thank you ma'am" and reached out to SLP's hand for embrace. Patient was left in bed with alarm activated and immediate needs within reach at end of session. Continue per current plan of care.      Pain Pain Assessment Pain Scale: 0-10 Pain Score: 0-No pain  Therapy/Group: Individual Therapy  Jesse [name] Jesse [name] 12/02/2020, 1:00 PM

## 2020-12-03 LAB — GLUCOSE, CAPILLARY
Glucose-Capillary: 107 mg/dL — ABNORMAL HIGH (ref 70–99)
Glucose-Capillary: 129 mg/dL — ABNORMAL HIGH (ref 70–99)
Glucose-Capillary: 99 mg/dL (ref 70–99)
Glucose-Capillary: 108 mg/dL — ABNORMAL HIGH (ref 70–99)

## 2020-12-03 MED ORDER — BETHANECHOL CHLORIDE 10 MG PO TABS
10.0000 mg | ORAL_TABLET | Freq: Three times a day (TID) | ORAL | Status: DC
  Administered 2020-12-03 – 2020-12-05 (×7): 10 mg via ORAL
  Filled 2020-12-03 (×7): qty 1

## 2020-12-03 MED ORDER — TAMSULOSIN HCL 0.4 MG PO CAPS
0.4000 mg | ORAL_CAPSULE | Freq: Every day | ORAL | Status: DC
  Administered 2020-12-03 – 2020-12-05 (×3): 0.4 mg via ORAL
  Filled 2020-12-03 (×3): qty 1

## 2020-12-03 NOTE — Progress Notes (Signed)
Speech Language Pathology Daily Session Note  Patient Details  Name: Jesse [name] MRN: [id number] Date of Birth: December 20, 1975  Today's Date: 12/03/2020 SLP Individual Time: 1445-1530 SLP Individual Time Calculation (min): 45 min  Short Term Goals: Week 1: SLP Short Term Goal 1 (Week 1): Patient will follow one step verbal directions with maxA cues. SLP Short Term Goal 2 (Week 1): Patient will tolerate trials of Dys 3 solids without significant oral phase difficulty with modA. SLP Short Term Goal 3 (Week 1): Patient will point to common objects in field of two with 60% accuracy and maxA cues. SLP Short Term Goal 4 (Week 1): Patient will perform basic level functional tasks (brush teeth, feed self, wash up, etc) with setup and modA cues. SLP Short Term Goal 5 (Week 1): Patient will indicate basic level needs via multi-modal communication (verbal, visual, gestural) with maxA cues.  Skilled Therapeutic Interventions: Patient agreeable to skilled ST intervention with focus on communication goals. SLP initially facilitated object naming using [employer] "Naming" app on on iPad, however patient exhibited increased difficulty with visual perception thus utilized physical objects from [employer] instead. Patient named 2/11 objects (18% accuracy) with max A verbal/visual cues. He exhibited frequent phonemic/semantic paraphasias and neologisms and easy to frustrate with task. He was only able to verbally repeat 1/11 words during structured task with max verbal/visual/tactile cues (hand tapping and intoning). Exhibited occasional spontaneous repetition at word level. He identified 4/4 objects (100% accuracy) in field of 2. Identified 9/14 (64% accuracy) objects in field of 3 without cues, and 11/14 with mod A verbal/visual cues and additional processing time. Unable to ID at letter and single word level and became increasingly agitated with task. Patient displayed some improvement with spontaneous responses,  although remains significantly restricted in ability to functionally communicate at this time. Patient was left in bed with alarm activated and immediate needs within reach at end of session. Continue per current plan of care.      Pain Pain Assessment Pain Scale: 0-10 Pain Score: 0-No pain  Therapy/Group: Individual Therapy  Patty Sermons 12/03/2020, 4:40 PM

## 2020-12-03 NOTE — Patient Care Conference (Signed)
Inpatient RehabilitationTeam Conference and Plan of Care Update Date: 12/03/2020   Time: 10:08 AM    Patient Name: Jesse [name]      Medical Record Number: [id number]  Date of Birth: February 03, 1976 Sex: Male         Room/Bed: 3N36R/4E31V-40 Payor Info: Payor: /    Admit Date/Time:  11/27/2020  4:24 PM  Primary Diagnosis:  Acute ischemic right MCA stroke [facility])  Hospital Problems: Principal Problem:   Acute ischemic right MCA stroke [facility])    Expected Discharge Date: Expected Discharge Date: 12/11/20  Team Members Present: Physician leading conference: Dr. Claudette Laws Social Worker Present: Lavera Guise, BSW Nurse Present: Chana Bode, RN PT Present: Grier Rocher, PT OT Present: Annye English, OT PPS Coordinator present : Fae Pippin, SLP     Current Status/Progress Goal Weekly Team Focus  Bowel/Bladder   pt is incontinenet B/B. Cannot void. q6-6hrs bladder scan. LBM 12/02/20  Pt. will maintain normal B/B pattern.  Toilet pt. q2hr and bladder scan q4-6hrs for I/O cath over   Swallow/Nutrition/ Hydration   Dys 2, thin - oral delay with mastication and transit. Poor intake. Sup A - full supervision during meals  Sup A  Dys 3 trials   ADL's   min UBD, LBD, CGA ambulatory toilet transfer no AD, max for toileting tasks; heavy visual/tactile/verbal cues for sequencing / initiation 2/2 ideational apraxia; low frustration tolerance  S ADL and transfers  self-care/balanace/transfer retraining, func cognition, pt/family/DME/AE education   Mobility             Communication   Max A, Severe expressive/receptive aphasia  Mod A  Communicating functional needs, automatic speech tasks, object identification, following simple commands, trial communication boards.   Safety/Cognition/ Behavioral Observations  mod-to-max A, difficult to assess cognition secondary to severity of language deficits  Mod A for problem solving, min A for attention  sequencing and initiation  with ADLs, safety awareness   Pain   Pain is currently manage with ordered pain medication.  If pain not relieve by ordered pain medication, notify provider  Assess pt. for pain on qshift and administer ordered pain medication   Skin   No skin issuees  Pt. will maintain intact skin intergrity  Assess pt. skin for skin break down daily and on evry shift.     Discharge Planning:  Patient discharging to mothers home (initally, until home construction is complete) 2 level home, 2 steps. (uninsured)   Team Discussion: Cortrak out and eating well. Currently on D2 thin diet due to oral delay with mastication and transit of food. Urinary retention and requiring I+O caths addressed with addition of medicaitons per MD. Need to monitor vital signs and side effects of meds.   Patient on target to meet rehab goals: yes, currently min assist for dressing. Functional status fluctuates and patient requires multimodal cues. Completes sit - stand with supervision and ambulates with CGA. Requires min assist for off balance activities, and changing of direction and navigation of obstacles. Requires max assist for communication due to severe receptive and express aphasia that impairs communication of functional needs. Also requires frequent redirection and emotional support.  The patient has a low frustration tolerance which is more noticeable with requirement of cues to correct/redirect. Discharge goals set at supervision overall and  mod assist for cognition.   *See Care Plan and progress notes for long and short-term goals.   Revisions to Treatment Plan:  D3 diet trials Picture board trials   Teaching Needs: Safety,  medications, transfers, etc  Current Barriers to Discharge: Decreased caregiver support and Home enviroment access/layout  Possible Resolutions to Barriers: Family education     Medical Summary Current Status: Patient with urinary retention, question etiology, oral intake improving, no  longer has feeding tube.  Barriers to Discharge: Medical stability;Neurogenic Bowel & Bladder   Possible Resolutions to Barriers/Weekly Focus: In and out catheterization until patient is able to spontaneously void.  Trial of Urecholine as well as tamsulosin, monitor blood pressure which is on the low side   Continued Need for Acute Rehabilitation Level of Care: The patient requires daily medical management by a physician with specialized training in physical medicine and rehabilitation for the following reasons: Direction of a multidisciplinary physical rehabilitation program to maximize functional independence : Yes Medical management of patient stability for increased activity during participation in an intensive rehabilitation regime.: Yes Analysis of laboratory values and/or radiology reports with any subsequent need for medication adjustment and/or medical intervention. : Yes   I attest that I was present, lead the team conference, and concur with the assessment and plan of the team.   Chana Bode B 12/03/2020, 12:18 PM

## 2020-12-03 NOTE — Progress Notes (Signed)
Patient unable to void on his own.  In/out catheterization performed at this time and obtained of clear, yellow urine. Patient tolerated procedure well.

## 2020-12-03 NOTE — Progress Notes (Signed)
Occupational Therapy Session Note  Patient Details  Name: Jesse [name] MRN: [id number] Date of Birth: May 05, 1975  Today's Date: 12/03/2020 OT Individual Time: 0786-7544 OT Individual Time Calculation (min): 53 min    Short Term Goals: Week 1:  OT Short Term Goal 1 (Week 1): STGs=LTGs due to ELOS  Skilled Therapeutic Interventions/Progress Updates:    Pt received semi-reclined in bed, no c/o pain throughout session, agreeable to therapy. Session focus on self-care retraining, activity tolerance, func cog, dynamic standing balance in prep for improved ADL/IADL/func mobility performance + decreased caregiver burden. Pt declines shower or need to toilet, stating "I just tried." Came to so sitting EOB with close S and visual cue for initiation. Short amb transfer CGA and no AD > w/c with visual cue for initiation. Presented with brush and pt able to brush his hair with no additional cuing for initiation/sequencing. Donned mask with mod A to put ties around ears, visual cue of pointing to face to correctly don. Total A w/c transport 2/2 time management and energy conservation. Stood to play 4 rounds of corn hole with CGA for balance, and only minimal initial cuing to toss bags. Pt able to toss majority of bags onto board and pick up from ground with CGA. Did req multimodal cuing for safety as pt with poor awareness of stepping around edges of board. Pt able to count each bag that he tossed up to "11", but unable to tally up points. Pt additionally able to copy 4 easy level difficulty lego designs after initially moderate visual cues to copy therapist's design. Able to correct errors with minimal cuing (stacked 4 blocks and matched various color patterns.) Finally, in kitchen pt presented with mixed up utensils and able to sort into utensil organizer with occasional min visual cues for accurately matching. Presented with various coffee ingredients (spoon, lid, creamer, sugar, etc) and instructed to make a  cup of coffee. Req min step-by-step cuing to mix in ingredients, pt with poor awareness of spilling creamer. Pt drank coffee and very appreciative of beverage. Therapy dog Dixie also present momentarily during session. Short amb transfer back to bed while holding coffee with CGA for balance. Returned to supine close S. Some spillage of beverage noted, but pt declining to change shirt.  Pt left with HOB at 30 degrees, with bed alarm engaged, call bell in reach, and all immediate needs met.    Therapy Documentation Precautions:  Precautions Precautions: Sternal, Fall Precaution Comments: cortrak Restrictions Weight Bearing Restrictions: No RUE Weight Bearing: Weight bearing as tolerated LUE Weight Bearing: Weight bearing as tolerated Other Position/Activity Restrictions: sternal  Pain:  No s/sx ADL: See Care Tool for more details.  Therapy/Group: Individual Therapy  Volanda Napoleon MS, OTR/L  12/03/2020, 6:49 AM

## 2020-12-03 NOTE — Progress Notes (Signed)
Physical Therapy Session Note  Patient Details  Name: Jesse [name] MRN: [id number] Date of Birth: 02/05/1976  Today's Date: 12/03/2020 PT Individual Time: 0800-0857 PT Individual Time Calculation (min): 57 min   Short Term Goals: Week 1:  PT Short Term Goal 1 (Week 1): STG = LTG due to ELOS  Skilled Therapeutic Interventions/Progress Updates:    Pt received asleep in bed with HOB elevated, easily roused. Supine>EOB supervision. Pt given pants/socks and able to donn with multimodal cuing. STS supervision while donning pants.   Gait training room <> therapy gyms and in between gyms with furthest distance >300ft x2. Pt demonstrated improved balance CGA requiring no hand held assist this session.   Environmental scanning with cone location task 3x6 cones CGA-intermittent minA for balance correction with directional changes.  Obstacles set up around the day room to increase difficulty with navigation with stepping over/around objects. Pt improved to generalized area cuing for 1/6 cones during 3rd round with varying heights from over head - down to ground.   Several bouts of obstacle navigation over 25ft, weaving through bowling pins, stepping over objects of varying height, and unlevel surfaces CGA. Pt required increased frequency of visual and one step cuing each time obstacles were changed to allow for understanding/motor planning task. CGA with intermittent MinA with directional changes and over unlevel surfaces. Utilized different objects with each task to improve success and prevent cross over/confusion 2/2 global aphasia  Pt completed a flight of stairs x2 (seat break in between) CGA ascending and intermittent minA descending. Single UE support on hand rail and reciprocal gait pattern. Attempted second trial without hand rail. Pt unable to complete / difficulty with instructions.   Horse shoe toss 3x10 rings. Airex pad x1 CGA, wedge x2 demonstrating posterior lean requiring up to MinA for  maintaining balance. First two rounds reaching towards left, 3rd round towards right.   Pt handoff to NT in bathroom. All needs met at this time.   Therapy Documentation Precautions:  Precautions Precautions: Sternal, Fall Precaution Comments: cortrak Restrictions Weight Bearing Restrictions: No RUE Weight Bearing: Weight bearing as tolerated LUE Weight Bearing: Weight bearing as tolerated Other Position/Activity Restrictions: sternal   Pain: Pain Assessment Pain Scale: 0-10 Faces Pain Scale: No hurt   Therapy/Group: Individual Therapy   , SPT  12/03/2020, 10:03 AM  

## 2020-12-03 NOTE — Progress Notes (Signed)
Patient ID: Jesse [name], male   DOB: 1975/11/01, 46 y.o.   MRN: [id number] Team Conference Report to Patient/Family  Team Conference discussion was reviewed with the patient and caregiver, including goals, any changes in plan of care and target discharge date.  Patient and caregiver express understanding and are in agreement.  The patient has a target discharge date of 12/11/20.  Sw met with patient and called spouse at bedside. Provided team conference updates. SW was able to schedule family education with patient spouse on 8/15 8:30-11:30 AM. No additional questions or concerns, sw will continue to follow up  Dyanne Iha 12/03/2020, 1:20 PM

## 2020-12-03 NOTE — Progress Notes (Signed)
PROGRESS NOTE   Subjective/Complaints: Remains severely aphasic  Required cath yesterday   ROS: cannot obtain due to aphasia   Objective:   No results found. Recent Labs    12/01/20 0334  WBC 6.2  HGB 10.5*  HCT 33.4*  PLT 278    Recent Labs    12/01/20 0334  NA 136  K 4.1  CL 101  CO2 26  GLUCOSE 98  BUN 23*  CREATININE 1.18  CALCIUM 8.8*     Intake/Output Summary (Last 24 hours) at 12/03/2020 3646 Last data filed at 12/03/2020 0455 Gross per 24 hour  Intake 118 ml  Output 3200 ml  Net -3082 ml      Pressure Injury 11/14/20 Vertebral column Stage 1 -  Intact skin with non-blanchable redness of a localized area usually over a bony prominence. 3x4 area w/ scab on outside and pink center on bony prominince (Active)  11/14/20 0800  Location: Vertebral column  Location Orientation:   Staging: Stage 1 -  Intact skin with non-blanchable redness of a localized area usually over a bony prominence.  Wound Description (Comments): 3x4 area w/ scab on outside and pink center on bony prominince  Present on Admission: Yes    Physical Exam: Vital Signs Blood pressure 115/82, pulse 84, temperature 98.9 F (37.2 C), resp. rate 16, height 6' 2"  (1.88 m), weight 57.9 kg, SpO2 100 %.    General: No acute distress Mood and affect are appropriate Heart: Regular rate and rhythm no rubs murmurs or extra sounds Lungs: Clear to auscultation, breathing unlabored, no rales or wheezes Abdomen: Positive bowel sounds, soft nontender to palpation, nondistended Extremities: No clubbing, cyanosis, or edema  Psych: pleasant and cooperative   Skin: No evidence of breakdown, back wound, sternotomy incision CDI  Neurologic: Cranial nerves II through XII intact, motor strength is 5/5 in bilateral deltoid, bicep, tricep, grip, hip flexor, knee extensors, ankle dorsiflexor and plantar flexor Sensory exam normal sensation to light  touch and proprioception in bilateral upper and lower extremities Cerebellar exam normal finger to nose to finger as well as heel to shin in bilateral upper and lower extremities Musculoskeletal: Full range of motion in all 4 extremities. No joint swelling   Assessment/Plan: 1. Functional deficits which require 3+ hours per day of interdisciplinary therapy in a comprehensive inpatient rehab setting. Physiatrist is providing close team supervision and 24 hour management of active medical problems listed below. Physiatrist and rehab team continue to assess barriers to discharge/monitor patient progress toward functional and medical goals  Care Tool:  Bathing    Body parts bathed by patient: Face   Body parts bathed by helper: Front perineal area, Buttocks     Bathing assist Assist Level: Contact Guard/Touching assist (standing at sink)     Upper Body Dressing/Undressing Upper body dressing   What is the patient wearing?: Pull over shirt    Upper body assist Assist Level: Minimal Assistance - Patient > 75%    Lower Body Dressing/Undressing Lower body dressing      What is the patient wearing?: Pants     Lower body assist Assist for lower body dressing: Minimal Assistance - Patient > 75%  Toileting Toileting Toileting Activity did not occur Landscape architect and hygiene only): N/A (no void or bm)  Toileting assist Assist for toileting: Maximal Assistance - Patient 25 - 49%     Transfers Chair/bed transfer  Transfers assist     Chair/bed transfer assist level: Minimal Assistance - Patient > 75%     Locomotion Ambulation   Ambulation assist      Assist level: Minimal Assistance - Patient > 75% Assistive device: Hand held assist Max distance: 429   Walk 10 feet activity   Assist     Assist level: Minimal Assistance - Patient > 75% Assistive device: Hand held assist   Walk 50 feet activity   Assist    Assist level: Minimal Assistance -  Patient > 75% Assistive device: Hand held assist    Walk 150 feet activity   Assist    Assist level: Minimal Assistance - Patient > 75% Assistive device: Hand held assist    Walk 10 feet on uneven surface  activity   Assist Walk 10 feet on uneven surfaces activity did not occur: Safety/medical concerns         Wheelchair     Assist Will patient use wheelchair at discharge?: No   Wheelchair activity did not occur: N/A         Wheelchair 50 feet with 2 turns activity    Assist    Wheelchair 50 feet with 2 turns activity did not occur: N/A       Wheelchair 150 feet activity     Assist  Wheelchair 150 feet activity did not occur: N/A       Blood pressure 115/82, pulse 84, temperature 98.9 F (37.2 C), resp. rate 16, height 6' 2"  (1.88 m), weight 57.9 kg, SpO2 100 %.    Medical Problem List and Plan: 1.  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 Formulized individual treatment plan based on medical history, underlying problem and comorbidities.  2.  Antithrombotics: -DVT/anticoagulation:  Pharmaceutical: Lovenox             -antiplatelet therapy: ASA 3. Pain Management:  Oxycodone 4. Mood: LCSW to follow for evaluation and support.             -antipsychotic agents: N/A 5. Neuropsych: This patient is not capable of making decisions on his own behalf. 6. Skin/Wound Care:  Routine pressure-relief measures. -- Monitor wound for healing. 7. Fluids/Electrolytes/Nutrition: Monitor I's/O.eating 100% meals but poor po fluid intake  8.  COVID PNA: To continue baricitinib for 1 additional day 9.  HTN: Monitor blood pressures 3 times  daily.  Continue Coreg and hydralazine. Vitals:   12/02/20 0514 12/02/20 2026  BP: 130/89 115/82  Pulse:  84  Resp:  16  Temp:  98.9 F (37.2 C)  SpO2:  100%    8/10 well controlled 10.  Dysphagia: Tolerating dysphagia 2, thin liquids without aspiration.             --eating 100%   NGT removed 8/6---BMET normal on 8/8 11.  Anxiety/agitation: Will continue ativan prn. D/c haldol--last used 07/31. Continue SSI while on tube feeds.   -appears much improved.   12.  Urinary retention no obvious meds intake fair will add urecholine and flomax monitor BP   LOS: 6 days A FACE TO FACE EVALUATION WAS PERFORMED  Charlett Blake 12/03/2020, 8:33 AM

## 2020-12-03 NOTE — Progress Notes (Signed)
Physical Therapy Session Note  Patient Details  Name: Jesse [name] MRN: [id number] Date of Birth: [birth year]  Today's Date: 12/03/2020 PT Individual Time: 1300-1345 PT Individual Time Calculation (min): 45 min   Short Term Goals: Week 1:  PT Short Term Goal 1 (Week 1): STG = LTG due to ELOS  Skilled Therapeutic Interventions/Progress Updates:  Patient supine in bed on entrance to room. Patient alert and agreeable to PT session. Patient denied pain during session.  Therapeutic Activity: Bed Mobility: Patient performed supine <> sit with Mod I.  Transfers: Patient performed STS and SPVT transfers throughout session with close supervision/ int CGA. Provided vc for push from chair.  Gait Training:  Patient ambulated >200 feet x2 with no AD and CGA/ intermittent short bouts of  close supervision. Demonstrated flexed posture d/t height, decreased overall step height with good heel to toe technique. Provided vc/ tc for increasing step height.   Neuromuscular Re-ed: NMR facilitated during session with focus on following instructions, sequencing and recognizing numbers and colors, standing balance. Pt guided in standing balance activity involving reaching to numbered targets on wall from mid thigh height to overhead height and just at as well as outside of BOS. Pt able to accurately and correctly touch 90% of attempts. With request for touching numbers in sequence with R then L hands, pt able to correctly sequence but unable to state correct number. Progressed to standing balance activity at table and request to pt to place black disc over colored disc on table when color is called. Pt correctly chooses color 60% of time. Continues to have difficulty with green, orange and yellow colors with improvement noted during session with recognition of green color. In attempt to state word "yellow" pt uses word with associated "y" sound in middle of word with all four attempts. Pt praised for consistency and  approximating "y" sound. Pt then guided in stance on [employer] and able to maintain balance with CGA and self perturbations with arm movements, progressed to using 4# weight in AP movements as well as with pot stirrers. Pt steps over 6" hurdles and through 5 cones in slalom with good balance requiring extensive vc for technique and direction.  NMR performed for improvements in motor control and coordination, balance, sequencing, judgement, and self confidence/ efficacy in performing all aspects of mobility at highest level of independence.   Pt very appreciative at end of session and states, "thank you for helping me".   Patient supine  in bed at end of session with brakes locked, bed alarm set, and all needs within reach. RN present to in/ out cath in order to void bladder.      Therapy Documentation Precautions:  Precautions Precautions: Sternal, Fall Precaution Comments: global aphasia, DYS 2 diet, full supervision with thin liquids Restrictions Weight Bearing Restrictions: No Other Position/Activity Restrictions: sternal  Therapy/Group: Individual Therapy  Loel Dubonnet PT, DPT 12/03/2020, 9:03 AM

## 2020-12-04 LAB — GLUCOSE, CAPILLARY
Glucose-Capillary: 112 mg/dL — ABNORMAL HIGH (ref 70–99)
Glucose-Capillary: 113 mg/dL — ABNORMAL HIGH (ref 70–99)
Glucose-Capillary: 129 mg/dL — ABNORMAL HIGH (ref 70–99)
Glucose-Capillary: 107 mg/dL — ABNORMAL HIGH (ref 70–99)

## 2020-12-04 NOTE — Progress Notes (Signed)
Speech Language Pathology Weekly Progress and Session Note  Patient Details  Name: Jesse [name] MRN: [id number] Date of Birth: [birth year]  Beginning of progress report period: November 28, 2020 End of progress report period: December 04, 2020  Today's Date: 12/05/2020 SLP Individual Time: 1450-1530 SLP Individual Time Calculation (min): 40 min  Short Term Goals: Week 1: SLP Short Term Goal 1 (Week 1): Patient will follow one step verbal directions with maxA cues. SLP Short Term Goal 1 - Progress (Week 1): Met SLP Short Term Goal 2 (Week 1): Patient will tolerate trials of Dys 3 solids without significant oral phase difficulty with modA. SLP Short Term Goal 2 - Progress (Week 1): Progressing toward goal SLP Short Term Goal 3 (Week 1): Patient will point to common objects in field of two with 60% accuracy and maxA cues. SLP Short Term Goal 3 - Progress (Week 1): Met SLP Short Term Goal 4 (Week 1): Patient will perform basic level functional tasks (brush teeth, feed self, wash up, etc) with setup and modA cues. SLP Short Term Goal 4 - Progress (Week 1): Met SLP Short Term Goal 5 (Week 1): Patient will indicate basic level needs via multi-modal communication (verbal, visual, gestural) with maxA cues. SLP Short Term Goal 5 - Progress (Week 1): Met    New Short Term Goals: Week 2: SLP Short Term Goal 1 (Week 2): STG=LTG due to ELOS  Weekly Progress Updates: Patient has met 4 of 5 short term goals this reporting period. Patient has made functional progress with ST over the past week due to improved automatic speech, object identification, following directions, spontaneous speech, and progressing toward indicating basic needs with multimodal communication. Patient currently completing language receptive language tasks with mod A verbal/visual cues, and expressive language tasks with max A multimodal cues. Severity of deficits continue to significantly impact communication of functional needs.  Although improving, patient can be easy to frustrate and emotional about his communication deficits which appear to significantly impact socialization and quality of life. Patient is currently tolerating Dys 2 diet and thin liquids with continued trials of Dys 3 textures. Family education has not yet been completed but highly recommended. Recommend continued skilled ST intervention to maximize expressive/receptive language function and functional independence prior to discharge. Continue progression toward established LTGs.   Intensity: Minimum of 1-2x/day, 30 to 90 minutes Frequency: 3 to 5 out of 7 days Duration/Length of Stay: 8/18 Treatment/Interventions: Cognitive remediation/compensation;Speech/Language facilitation;Environmental controls;Cueing hierarchy;Functional tasks;Patient/family education;Multimodal communication approach;Dysphagia/aspiration precaution training  Daily Session Skilled Therapeutic Interventions: Patient agreeable to skilled ST intervention with focus on communication and swallowing goals. Patient completed self feeding with set-up and sup A cues for safety with rate of consumption. Patient exhibited decreased/minimal mastication with Dys 3 textures resulting in poor bolus breakdown. Patient was not receptive to cues for more thorough mastication 2/2 receptive language deficits. No overt s/sx of aspiration noted. Patient does not appear appropriate for diet advancement at this time. Continue Dys 2 textures and thin liquids. Facilitated object naming in ADL apartment with max multimodal cues. Patient was unable to independently name any items (15 attempts). He was able to verbally repeat verbal stimulus during 1/15 occasions with approximations most consistent with initial consonant during 7/13 attempts. 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 Patient was left in bed  with alarm activated and immediate needs within reach at end of session. Continue per current plan  of care.       General    Pain - no pain   Therapy/Group: Individual Therapy  Patty Sermons 12/05/2020, 5:10 PM

## 2020-12-04 NOTE — Progress Notes (Signed)
Occupational Therapy Session Note  Patient Details  Name: Jesse [name] MRN: [id number] Date of Birth: 02-10-1976  Today's Date: 12/04/2020 OT Individual Time: 7680-8811 OT Individual Time Calculation (min): 53 min    Short Term Goals: Week 1:  OT Short Term Goal 1 (Week 1): STGs=LTGs due to ELOS  Skilled Therapeutic Interventions/Progress Updates:    Pt received asleep in bed, but easily awakened to voice, no s/sx pain throughout session, agreeable to therapy. Session focus on self-care retraining, activity tolerance, func cognition, IADL retraining, dynamic standing balance, command follow in prep for improved ADL/IADL/func mobility performance + decreased caregiver burden. Came to sitting EOB with visual cue and S for balance. Presented with shirt to change into, but pt initially donning over leg like pants. Pt asked "is this not right?" And more receptive to gentle correction this date. Req light tactile cue to doff shirt and visual cue to don new one with S. Tactile cue to doff pants and then donned new ones with CGA for STS. Donned R sock as well with S. Pt cont to decline shower/need to toilet. Standing at sink, pt able to brush hair when presented with comb, wash face with wash cloth after visual demonstration, and brush teeth with set-up A of toothbrush. However, poor awareness of spilled toothpaste. Amb to and from [facility] nurses station to retrieve coffee. Pt presented with creamer, coffee cup and spoon, and miin visual cuing to mix ingredients in with spoon. Amb back to room while holding coffee with CGA for balance and min cueing for safe obstacle navigation. Seated EOB, pt self-fed 90% of breakfast, noted to eat quickly and req consistent cuing to slow down and alternative sips per SLP swallowing sheet, but noted poor comprehension. Pt able to open all items, and minimal spilling noted, although poor awareness of spilling that did occur. Short amb > sink to brush teeth again, handed items and  pt initially squirting tooth paste directly into mouth req assist to place on tooth brush. Completed oral care with close S. Amb > gym > nurses station  > room with CGA, mild swaying and furniture walking noted at times. After minimal visual demonstration, pt able to fold various towels, pillow cases, and wash clothes to place in laundry basket with S and increased time. Finally, presented again to coffee machine, req step-by-step cuing to use machine, and pt able to communicate that he would like to add 2 creamers. Transitioned back to bed with close S.   Pt left with HOB at 30 degrees with bed alarm engaged, call bell in reach, and all immediate needs met.    Therapy Documentation Precautions:  Precautions Precautions: Sternal, Fall Precaution Comments: global aphasia, DYS 2 diet, full supervision with thin liquids Restrictions Weight Bearing Restrictions: No RUE Weight Bearing: Weight bearing as tolerated LUE Weight Bearing: Weight bearing as tolerated Other Position/Activity Restrictions: sternal  Pain:  No s/sx throughout session ADL: See Care Tool for more details.  Therapy/Group: Individual Therapy  Volanda Napoleon MS, OTR/L  12/04/2020, 7:00 AM

## 2020-12-04 NOTE — Progress Notes (Signed)
Physical Therapy Session Note  Patient Details  Name: Jesse [name] MRN: [id number] Date of Birth: [birth year]  Today's Date: 12/04/2020 PT Individual Time: 0902-0933 PT Individual Time Calculation (min): 31 min   Short Term Goals: Week 1:  PT Short Term Goal 1 (Week 1): STG = LTG due to ELOS  Skilled Therapeutic Interventions/Progress Updates:    Pt received supine in bed and agreeable to therapy session. Supine>sitting L EOB, HOB flat and not using bedrails, supervision. Sit<>stands, no AD, with CGA for steadying during session. Pt does not have shoes at this time. Gait training ~150ft to main therapy gym, no AD, with CGA for steadying - demos reciprocal stepping pattern with adequate gait speed but slight increased unsteadiness noted when turning. Attempted standing foot taps to external targets on verbal command; however, due to aphasia pt unable to understand task and ends up picking up the objects from the floor - does so with only CGA - therapist attempts to provide visual demonstration of foot taps but pt becomes frustrated with his inability to understand - discontinued this task. Gait training ~12ft to ADL apartment, no AD, with CGA for steadying as described above. Engaged pt in dual-task verbal and balance challenge via having to say the names of fruit and then find them in the cabinets/drawers - pt unable to verbalize the name of each fruit despite therapist stating them aloud first (often pt saying "petro" "one" "two" or a word starting with "t" sound) pt aware of his verbal mistakes and becomes frustrated with his inability to state the fruit names (able to communicate his frustration in clear, full sentences with only a few instances of incorrect word selection). Therapist educated him on location of CVA and reasoning for this with encouragement to continue practicing language skills. While ambulating in kitchen able to maintain balance with CGA/light min assist - does require cuing to  visually scan and locate all cabinets with some possible visual impairments noted. Gait training back to main therapy gym. Focused on recall of colors from yesterday's PT session with pt able to select correct color when given choice of 4 but not verbalize the color name. Gait training back to room and pt left sitting up in bed with needs in reach and bed alarm on.  Therapy Documentation Precautions:  Precautions Precautions: Sternal, Fall Precaution Comments: global aphasia, DYS 2 diet, full supervision with thin liquids Restrictions Weight Bearing Restrictions: No RUE Weight Bearing: Weight bearing as tolerated LUE Weight Bearing: Weight bearing as tolerated Other Position/Activity Restrictions: sternal   Pain:  Denies pain during session.  Therapy/Group: Individual Therapy  Ginny Forth , PT, DPT, NCS, CSRS 12/04/2020, 7:49 AM

## 2020-12-04 NOTE — Progress Notes (Signed)
PROGRESS NOTE   Subjective/Complaints:  Remains with fluent aphasia   ROS: cannot obtain due to aphasia   Objective:   No results found. No results for input(s): WBC, HGB, HCT, PLT in the last 72 hours.  No results for input(s): NA, K, CL, CO2, GLUCOSE, BUN, CREATININE, CALCIUM in the last 72 hours.   Intake/Output Summary (Last 24 hours) at 12/04/2020 0808 Last data filed at 12/04/2020 0435 Gross per 24 hour  Intake 1320 ml  Output 3205 ml  Net -1885 ml      Pressure Injury 11/14/20 Vertebral column Stage 1 -  Intact skin with non-blanchable redness of a localized area usually over a bony prominence. 3x4 area w/ scab on outside and pink center on bony prominince (Active)  11/14/20 0800  Location: Vertebral column  Location Orientation:   Staging: Stage 1 -  Intact skin with non-blanchable redness of a localized area usually over a bony prominence.  Wound Description (Comments): 3x4 area w/ scab on outside and pink center on bony prominince  Present on Admission: Yes    Physical Exam: Vital Signs Blood pressure 115/87, pulse 79, temperature 98.4 F (36.9 C), temperature source Oral, resp. rate 20, height 6\' 2"  (1.88 m), weight 58.9 kg, SpO2 100 %.  General: No acute distress Mood and affect are appropriate Heart: Regular rate and rhythm no rubs murmurs or extra sounds Lungs: Clear to auscultation, breathing unlabored, no rales or wheezes Abdomen: Positive bowel sounds, soft nontender to palpation, nondistended Extremities: No clubbing, cyanosis, or edema   Psych: pleasant and cooperative   Skin: No evidence of breakdown, back wound, sternotomy incision CDI  Neurologic: Cranial nerves II through XII intact, motor strength is 5/5 in bilateral deltoid, bicep, tricep, grip, hip flexor, knee extensors, ankle dorsiflexor and plantar flexor Sensory exam normal sensation to light touch and proprioception in bilateral  upper and lower extremities +motor apraxia  Musculoskeletal: Full range of motion in all 4 extremities. No joint swelling   Assessment/Plan: 1. Functional deficits which require 3+ hours per day of interdisciplinary therapy in a comprehensive inpatient rehab setting. Physiatrist is providing close team supervision and 24 hour management of active medical problems listed below. Physiatrist and rehab team continue to assess barriers to discharge/monitor patient progress toward functional and medical goals  Care Tool:  Bathing    Body parts bathed by patient: Face   Body parts bathed by helper: Front perineal area, Buttocks     Bathing assist Assist Level: Contact Guard/Touching assist (standing at sink)     Upper Body Dressing/Undressing Upper body dressing   What is the patient wearing?: Pull over shirt    Upper body assist Assist Level: Minimal Assistance - Patient > 75%    Lower Body Dressing/Undressing Lower body dressing      What is the patient wearing?: Pants     Lower body assist Assist for lower body dressing: Minimal Assistance - Patient > 75%     Toileting Toileting Toileting Activity did not occur (Clothing management and hygiene only): N/A (no void or bm)  Toileting assist Assist for toileting: Maximal Assistance - Patient 25 - 49%     Transfers Chair/bed transfer  Transfers assist     Chair/bed transfer assist level: Minimal Assistance - Patient > 75%     Locomotion Ambulation   Ambulation assist      Assist level: Minimal Assistance - Patient > 75% Assistive device: Hand held assist Max distance: 429   Walk 10 feet activity   Assist     Assist level: Minimal Assistance - Patient > 75% Assistive device: Hand held assist   Walk 50 feet activity   Assist    Assist level: Minimal Assistance - Patient > 75% Assistive device: Hand held assist    Walk 150 feet activity   Assist    Assist level: Minimal Assistance - Patient  > 75% Assistive device: Hand held assist    Walk 10 feet on uneven surface  activity   Assist Walk 10 feet on uneven surfaces activity did not occur: Safety/medical concerns         Wheelchair     Assist Will patient use wheelchair at discharge?: No   Wheelchair activity did not occur: N/A         Wheelchair 50 feet with 2 turns activity    Assist    Wheelchair 50 feet with 2 turns activity did not occur: N/A       Wheelchair 150 feet activity     Assist  Wheelchair 150 feet activity did not occur: N/A       Blood pressure 115/87, pulse 79, temperature 98.4 F (36.9 C), temperature source Oral, resp. rate 20, height 6\' 2"  (1.88 m), weight 58.9 kg, SpO2 100 %.    Medical Problem List and Plan: 1.  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  Antithrombotics: -DVT/anticoagulation:  Pharmaceutical: Lovenox             -antiplatelet therapy: ASA 3. Pain Management:  Oxycodone 4. Mood: LCSW to follow for evaluation and support.             -antipsychotic agents: N/A 5. Neuropsych: This patient is not capable of making decisions on his own behalf. 6. Skin/Wound Care:  Routine pressure-relief measures. -- Monitor wound for healing. 7. Fluids/Electrolytes/Nutrition: Monitor I's/O.eating 100% meals but poor po fluid intake  8.  COVID PNA: To continue baricitinib for 1 additional day 9.  HTN: Monitor blood pressures 3 times daily.  Continue Coreg and hydralazine. Vitals:   12/03/20 2000 12/04/20 0443  BP: 111/77 115/87  Pulse: 91 79  Resp: 20 20  Temp: 98.3 F (36.8 C) 98.4 F (36.9 C)  SpO2: 100% 100%    8/11 well controlled 10.  Dysphagia: Tolerating dysphagia 2, thin liquids without aspiration.             --eating 100%   NGT removed 8/6---BMET normal  on 8/8 11.  Anxiety/agitation: Will continue ativan prn. D/c haldol--last used 07/31. Continue SSI while on tube feeds.   -appears much improved.   12.  Urinary retention no obvious meds intake fair will add urecholine and flomax monitor BP , no spont voids recorded   LOS: 7 days A FACE TO FACE EVALUATION WAS PERFORMED  8/31 12/04/2020, 8:08 AM

## 2020-12-04 NOTE — Progress Notes (Signed)
Patient ID: Jesse [name], male   DOB: [birth year], 45 y.o.   MRN: [id number] Wife here to get incapacity letter signed by PA or MD. Pam-PA singed form for wife and returned to her.

## 2020-12-04 NOTE — Progress Notes (Signed)
Speech Language Pathology Daily Session Note  Patient Details  Name: Jesse [name] MRN: [id number] Date of Birth: 1975/06/16  Today's Date: 12/04/2020 SLP Individual Time: 0930-1015 SLP Individual Time Calculation (min): 45 min  Short Term Goals: Week 1: SLP Short Term Goal 1 (Week 1): Patient will follow one step verbal directions with maxA cues. SLP Short Term Goal 2 (Week 1): Patient will tolerate trials of Dys 3 solids without significant oral phase difficulty with modA. SLP Short Term Goal 3 (Week 1): Patient will point to common objects in field of two with 60% accuracy and maxA cues. SLP Short Term Goal 4 (Week 1): Patient will perform basic level functional tasks (brush teeth, feed self, wash up, etc) with setup and modA cues. SLP Short Term Goal 5 (Week 1): Patient will indicate basic level needs via multi-modal communication (verbal, visual, gestural) with maxA cues.  Skilled Therapeutic Interventions: Patient agreeable to skilled ST intervention with focus on communication goals. SLP facilitated color matching task by scooping colored balls with large spoon and placing them into matching cup. Patient completed initially requiring mod A verbal and visual demonstration for motor planning with spoon, fading to sup A verbal cues. He matched colors with 100% accuracy. Letter identification was 100% accuracy with field of 3, progressing to field of 5 with 75% accuracy with mod A verbal/visual cues. Patient completed object functions with 100% accuracy with field of 2. Automatic speech tasks involved counting 1-10 in which patient produced 1-9 during second attempt. Difficulty with production of the word "ten." Unable to produce days of the week. Continues to exhibit significant difficulty with phoneme and word repetition as evidenced by success during <10% of attempts with max verbal/visual/tactile cueing. Improved spontaneous speech noted on this date during unstructured speech tasks. Patient  was left in bed with alarm activated and immediate needs within reach at end of session. Continue per current plan of care.      Pain Pain Assessment Pain Scale: 0-10 Pain Score: 0-No pain  Therapy/Group: Individual Therapy  Conor Lata T Hao Dion 12/04/2020, 10:10 AM

## 2020-12-04 NOTE — Progress Notes (Signed)
Physical Therapy Session Note  Patient Details  Name: Jesse [name] MRN: [id number] Date of Birth: 06-24-1975  Today's Date: 12/04/2020 PT Individual Time: 1400-1458 PT Individual Time Calculation (min): 58 min   Short Term Goals: Week 1:  PT Short Term Goal 1 (Week 1): STG = LTG due to ELOS  Skilled Therapeutic Interventions/Progress Updates:    Pt asleep in bed, responding to name upon arrival. No expressions of pain throughout session   Supine HOB <> EOB with supervision and visual cuing for task. STS throughout session supervision.   Gait training room <>therapy gym >200 feet x2 with no AD and CGA- intermittent short bouts of  close supervision. Demonstrated cervical flexed with downward gaze, decreased overall step height with good heel to toe technique.  Standing dynamic balance with ball toss x3 min on purple mat. Attempted to have patient perform step with catch, however, unable to complete 2/2 global aphasia. Pt demonstrated good reaction adjustments while catching outside BOS. No LOB.   Pt able to identify red, green, yellow, blue, purple, orange bean bags x3.Unable to correctly verbalize color when asked. Bean bags placed around therapy gym with obstacles including unlevel surface, stairs, tangled in basketball hoop and at various heights.  Pt required cuing for generalized direction ~25% of the time. 3 times pt attempted to grab wrong color, however, pt able to self correct stating "no." Prior to grabbing bag. 2 instances of noted frustration, requiring encouragement to continue with task. All completed with CGA-close supervision.   Standing balance on wedge with clothes pin activity. CGA-intermittent MinA for balance 2/2 increased posterior lean. Pt able to grab and organize red, yellow, green pins. Intermittently requiring verbal cuing for correct pin color.  Therapist noted signs of BM, guided patient into bathroom. Pt incontinent of bowel. Pt doffed pants with verbal cuing,  hesitant to doff brief. Pt expressed through body language and attempted to verbally express discomfort with male therapist assist. Male clinical instructor brought in to assist. TotalA for peri care/ hygiene/fresh brief 2/2 motor apraxia and aphasia.   Clean scrub top and bottoms donned with minA to initiate task with shirt, set up assist for pants.  Pt in bed with bed alarm on, call bell within reach.   Therapy Documentation Precautions:  Precautions Precautions: Sternal, Fall Precaution Comments: global aphasia, DYS 2 diet, full supervision with thin liquids Restrictions Weight Bearing Restrictions: No RUE Weight Bearing: Weight bearing as tolerated LUE Weight Bearing: Weight bearing as tolerated Other Position/Activity Restrictions: sternal  Pain: Pain Assessment Pain Scale: 0-10 Faces Pain Scale: No hurt   Therapy/Group: Individual Therapy  Yittel Emrich, SPT  12/04/2020, 3:14 PM

## 2020-12-05 LAB — GLUCOSE, CAPILLARY
Glucose-Capillary: 141 mg/dL — ABNORMAL HIGH (ref 70–99)
Glucose-Capillary: 94 mg/dL (ref 70–99)
Glucose-Capillary: 122 mg/dL — ABNORMAL HIGH (ref 70–99)
Glucose-Capillary: 102 mg/dL — ABNORMAL HIGH (ref 70–99)

## 2020-12-05 LAB — BASIC METABOLIC PANEL
Anion gap: 9 (ref 5–15)
BUN: 26 mg/dL — ABNORMAL HIGH (ref 6–20)
CO2: 24 mmol/L (ref 22–32)
Calcium: 8.3 mg/dL — ABNORMAL LOW (ref 8.9–10.3)
Chloride: 96 mmol/L — ABNORMAL LOW (ref 98–111)
Creatinine, Ser: 1.28 mg/dL — ABNORMAL HIGH (ref 0.61–1.24)
GFR, Estimated: >60 mL/min (ref >60)
Glucose, Bld: 131 mg/dL — ABNORMAL HIGH (ref 70–99)
Potassium: 3.9 mmol/L (ref 3.5–5.1)
Sodium: 129 mmol/L — ABNORMAL LOW (ref 135–145)

## 2020-12-05 MED ORDER — BETHANECHOL CHLORIDE 25 MG PO TABS
25.0000 mg | ORAL_TABLET | Freq: Three times a day (TID) | ORAL | Status: DC
  Administered 2020-12-05 – 2020-12-06 (×4): 25 mg via ORAL
  Filled 2020-12-05 (×4): qty 1

## 2020-12-05 NOTE — Progress Notes (Signed)
Patient ID: Jesse [name], male   DOB: [birth year], 45 y.o.   MRN: [id number]  Transfer Bench ordered through [employer]

## 2020-12-05 NOTE — Progress Notes (Signed)
Inpatient Rehabilitation Care Coordinator Discharge Note  The overall goal for the admission was met for:   Discharge location: Yes, home  Length of Stay: Yes, 13 Days  Discharge activity level: Yes. Supervision.   Home/community participation: Yes. Limited.  Services provided included: MD, RD, PT, OT, SLP, RN, CM, TR, Pharmacy, Neuropsych, and SW  Financial Services: Other: uninsured  Choices offered to/list presented to: pt/spouse  Follow-up services arranged: Outpatient: [facility] for HHPT/OT/SLP with [facility]. Family encouraged to still schedule appointment with outpatient clinic.   Comments (or additional information): PT OT ST Transfer Bench  Patient/Family verbalized understanding of follow-up arrangements: Yes  Individual responsible for coordination of the follow-up plan: Barnett Applebaum [phone]  Confirmed correct DME delivered: Dyanne Iha 12/05/2020    Dyanne Iha

## 2020-12-05 NOTE — Progress Notes (Signed)
PROGRESS NOTE   Subjective/Complaints:  No new issues. Found him walking back from therapy.   ROS: limited due to language/communication   Objective:   No results found. No results for input(s): WBC, HGB, HCT, PLT in the last 72 hours.  No results for input(s): NA, K, CL, CO2, GLUCOSE, BUN, CREATININE, CALCIUM in the last 72 hours.   Intake/Output Summary (Last 24 hours) at 12/05/2020 1220 Last data filed at 12/05/2020 5625 Gross per 24 hour  Intake 600 ml  Output 2780 ml  Net -2180 ml     Pressure Injury 11/14/20 Vertebral column Stage 1 -  Intact skin with non-blanchable redness of a localized area usually over a bony prominence. 3x4 area w/ scab on outside and pink center on bony prominince (Active)  11/14/20 0800  Location: Vertebral column  Location Orientation:   Staging: Stage 1 -  Intact skin with non-blanchable redness of a localized area usually over a bony prominence.  Wound Description (Comments): 3x4 area w/ scab on outside and pink center on bony prominince  Present on Admission: Yes    Physical Exam: Vital Signs Blood pressure 122/73, pulse 76, temperature 98.6 F (37 C), temperature source Oral, resp. rate 17, height 6\' 2"  (1.88 m), weight 58.8 kg, SpO2 98 %.  Constitutional: No distress . Vital signs reviewed. HEENT: NCAT, EOMI, oral membranes moist Neck: supple Cardiovascular: RRR without murmur. No JVD    Respiratory/Chest: CTA Bilaterally without wheezes or rales. Normal effort    GI/Abdomen: BS +, non-tender, non-distended Ext: no clubbing, cyanosis, or edema Psych: pleasant and cooperative    Skin: No evidence of breakdown, back wound, sternotomy incision CDI  Neurologic: Cranial nerves II through XII intact, motor strength is 5/5 in bilateral deltoid, bicep, tricep, grip, hip flexor, knee extensors, ankle dorsiflexor and plantar flexor Sensory exam normal sensation to light touch and  proprioception in bilateral upper and lower extremities +motor apraxia. Good with automatic movements. Language appears to be improving. Answered me appropriately a number of times. Musculoskeletal: Full range of motion in all 4 extremities. No joint swelling   Assessment/Plan: 1. Functional deficits which require 3+ hours per day of interdisciplinary therapy in a comprehensive inpatient rehab setting. Physiatrist is providing close team supervision and 24 hour management of active medical problems listed below. Physiatrist and rehab team continue to assess barriers to discharge/monitor patient progress toward functional and medical goals  Care Tool:  Bathing    Body parts bathed by patient: Face   Body parts bathed by helper: Front perineal area, Buttocks     Bathing assist Assist Level: Contact Guard/Touching assist (standing at sink)     Upper Body Dressing/Undressing Upper body dressing   What is the patient wearing?: Pull over shirt    Upper body assist Assist Level: Contact Guard/Touching assist    Lower Body Dressing/Undressing Lower body dressing      What is the patient wearing?: Pants     Lower body assist Assist for lower body dressing: Contact Guard/Touching assist     Toileting Toileting Toileting Activity did not occur (Clothing management and hygiene only): N/A (no void or bm)  Toileting assist Assist for toileting: Maximal Assistance -  Patient 25 - 49%     Transfers Chair/bed transfer  Transfers assist     Chair/bed transfer assist level: Minimal Assistance - Patient > 75%     Locomotion Ambulation   Ambulation assist      Assist level: Minimal Assistance - Patient > 75% Assistive device: Hand held assist Max distance: 429   Walk 10 feet activity   Assist     Assist level: Minimal Assistance - Patient > 75% Assistive device: Hand held assist   Walk 50 feet activity   Assist    Assist level: Minimal Assistance - Patient >  75% Assistive device: Hand held assist    Walk 150 feet activity   Assist    Assist level: Minimal Assistance - Patient > 75% Assistive device: Hand held assist    Walk 10 feet on uneven surface  activity   Assist Walk 10 feet on uneven surfaces activity did not occur: Safety/medical concerns         Wheelchair     Assist Will patient use wheelchair at discharge?: No   Wheelchair activity did not occur: N/A         Wheelchair 50 feet with 2 turns activity    Assist    Wheelchair 50 feet with 2 turns activity did not occur: N/A       Wheelchair 150 feet activity     Assist  Wheelchair 150 feet activity did not occur: N/A       Blood pressure 122/73, pulse 76, temperature 98.6 F (37 C), temperature source Oral, resp. rate 17, height 6\' 2"  (1.88 m), weight 58.8 kg, SpO2 98 %.    Medical Problem List and Plan: 1.  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  Antithrombotics: -DVT/anticoagulation:  Pharmaceutical: Lovenox             -antiplatelet therapy: ASA 3. Pain Management:  Oxycodone 4. Mood: LCSW to follow for evaluation and support.             -antipsychotic agents: N/A 5. Neuropsych: This patient is not capable of making decisions on his own behalf. 6. Skin/Wound Care:  Routine pressure-relief measures. -- Monitor wound for healing. 7. Fluids/Electrolytes/Nutrition: Monitor I's/O.eating 100% meals    -I/O's -13,700 since admit---check bmet today 8.  COVID PNA: To continue baricitinib for 1 additional day 9.  HTN: Monitor blood pressures 3 times daily.  Continue Coreg and hydralazine. Vitals:   12/05/20 0413 12/05/20 0550  BP: 111/68 122/73  Pulse: 73 76  Resp: 20 17  Temp: 98.6 F (37 C)   SpO2: 99% 98%    8/12 well controlled 10.   Dysphagia: Tolerating dysphagia 2, thin liquids without aspiration.             --eating 100%   -normal labs---recheck today  11.  Anxiety/agitation: Will continue ativan prn. D/c haldol--last used 07/31. Continue SSI while on tube feeds.   -appears much improved.   12.  Urinary retention no obvious meds intake -continue  flomax monitor BP , no spont voids recorded   8/12-increase urecholine to 25mg  LOS: 8 days A FACE TO FACE EVALUATION WAS PERFORMED  8/31  Ermalene Postin 12/05/2020, 12:20 PM

## 2020-12-05 NOTE — Discharge Instructions (Addendum)
Inpatient Rehab Discharge Instructions  Jesse [name] Discharge date and time: No discharge date for patient encounter.   Activities/Precautions/ Functional Status: Activity: activity as tolerated Diet: Dysphagia #2 thin liquids Wound Care: Wound care of stage I pressure injury on vertebral column.  Cleanse with normal saline, pat dry.  Place a silicone foam dressing over affected area.  Change foam every 3 days and as needed rolling of dressing edges Functional status:  ___ No restrictions     ___ Walk up steps independently ___ 24/7 supervision/assistance   ___ Walk up steps with assistance ___ Intermittent supervision/assistance  ___ Bathe/dress independently ___ Walk with walker     _x__ Bathe/dress with assistance ___ Walk Independently    ___ Shower independently ___ Walk with assistance    ___ Shower with assistance ___ No alcohol     ___ Return to work/school ________  COMMUNITY REFERRALS UPON DISCHARGE:    Home Health : PT     OT    ST               Agency: [facility] (char[facility]) Phone: [phone] *Please understand sessions will be limited. Expect follow-up to schedule home visit within 2-3 days of discharge. If you have not received follow-up, be sure to contact the branch directly.*              Outpatient: PT     OT    ST                Agency: [facility]  Phone: [phone]              Appointment Date/Time: TBD *Please be sure to expect follow-up within 7-10 business days.If you have not received follow-up be sure to call and schedule. Be sure to expect this *  Medical Equipment/Items Ordered: Advertising copywriter, 3in1 BSC                                                 Agency/Supplier: [employer]  Special Instructions:  Ambulatory referral obtained with urology services to address urinary retention and Foley catheter tube care   No driving smoking or alcoholSTROKE/TIA DISCHARGE INSTRUCTIONS SMOKING Cigarette smoking nearly doubles your  risk of having a stroke & is the single most alterable risk factor  If you smoke or have smoked in the last 12 months, you are advised to quit smoking for your health. Most of the excess cardiovascular risk related to smoking disappears within a year of stopping. Ask you doctor about anti-smoking medications [place] Quit Line: 1-800-QUIT NOW Free Smoking Cessation Classes [phone]  CHOLESTEROL Know your levels; limit fat & cholesterol in your diet  Lipid Panel     Component Value Date/Time   CHOL 112 11/06/2020 0510   TRIG 114 11/15/2020 0348   HDL 24 (L) 11/06/2020 0510   CHOLHDL 4.7 11/06/2020 0510   VLDL 39 11/06/2020 0510   LDLCALC 49 11/06/2020 0510     Many patients benefit from treatment even if their cholesterol is at goal. Goal: Total Cholesterol (CHOL) less than 160 Goal:  Triglycerides (TRIG) less than 150 Goal:  HDL greater than 40 Goal:  LDL (LDLCALC) less than 100   BLOOD PRESSURE American Stroke Association blood pressure target is less that 120/80 mm/Hg  Your discharge blood pressure is:  BP: 122/73 Monitor your  blood pressure Limit your salt and alcohol intake Many individuals will require more than one medication for high blood pressure  DIABETES (A1c is a blood sugar average for last 3 months) Goal HGBA1c is under 7% (HBGA1c is blood sugar average for last 3 months)  Diabetes: No known diagnosis of diabetes    Lab Results  Component Value Date   HGBA1C 5.2 11/06/2020    Your HGBA1c can be lowered with medications, healthy diet, and exercise. Check your blood sugar as directed by your physician Call your physician if you experience unexplained or low blood sugars.  PHYSICAL ACTIVITY/REHABILITATION Goal is 30 minutes at least 4 days per week  Activity: Increase activity slowly, Therapies: Physical Therapy: Home Health Return to work:  Activity decreases your risk of heart attack and stroke and makes your heart stronger.  It helps control your weight and blood  pressure; helps you relax and can improve your mood. Participate in a regular exercise program. Talk with your doctor about the best form of exercise for you (dancing, walking, swimming, cycling).  DIET/WEIGHT Goal is to maintain a healthy weight  Your discharge diet is:  Diet Order             DIET DYS 2 Room service appropriate? No; Fluid consistency: Thin  Diet effective now                   liquids Your height is:  Height: 6\' 2"  (188 cm) Your current weight is: Weight: 58.8 kg Your Body Mass Index (BMI) is:  BMI (Calculated): 16.64 Following the type of diet specifically designed for you will help prevent another stroke. Your goal weight range is:   Your goal Body Mass Index (BMI) is 19-24. Healthy food habits can help reduce 3 risk factors for stroke:  High cholesterol, hypertension, and excess weight.  RESOURCES Stroke/Support Group:  Call [phone]   STROKE EDUCATION PROVIDED/REVIEWED AND GIVEN TO PATIENT Stroke warning signs and symptoms How to activate emergency medical system (call 911). Medications prescribed at discharge. Need for follow-up after discharge. Personal risk factors for stroke. Pneumonia vaccine given: No Flu vaccine given: No My questions have been answered, the writing is legible, and I understand these instructions.  I will adhere to these goals & educational materials that have been provided to me after my discharge from the hospital.       My questions have been answered and I understand these instructions. I will adhere to these goals and the provided educational materials after my discharge from the hospital.  Patient/Caregiver Signature _______________________________ Date __________  Clinician Signature _______________________________________ Date __________  Please bring this form and your medication list with you to all your follow-up doctor's appointments.

## 2020-12-05 NOTE — Progress Notes (Signed)
Physical Therapy Session Note  Patient Details  Name: Jesse [name] MRN: [id number] Date of Birth: 03-22-1976  Today's Date: 12/05/2020 PT Individual Time: 0800-0858 PT Individual Time Calculation (min): 58 min   Short Term Goals: Week 1:  PT Short Term Goal 1 (Week 1): STG = LTG due to ELOS  Skilled Therapeutic Interventions/Progress Updates:    Pt received in bed with HOB elevated upon entry. Pt agreeable to therapy. Bed mobility throughout supervision and verbal cuing for management of bed sheets. STS throughout session close supervision.   Gait training over several longer distances from room>day room and in halls prior to return to room. CGA-intermittent close supervision, longest distance >31ft. Pt demonstrated improved balance with no instances of cross over or LOB during redirecting with turns. Pt requires verbal cuing with visual gestures for navigation.   Environment scanning/color bean bag finding completed red, blue, green cone, yellow, purple x2 CGA-close supervision. Increased instances of requiring multimodal cuing from previous session for generalized location 2/2 identifying multiple objects with same color as bean bags. Noted frustration x3, requiring encouragement to continue with task.   Toe taps onto 6" step height with added red, blue, green, purple, yellow circle targets CGA-intermittent minA for steadying. Pt required verbal cuing of repeated color for redirection to correct color ~25% the time, worse following additional noise of new patients entering day room. Pt demonstrated increased frustration, therapist pivoted towards space in the room that was a little less distracting.   Circle targets placed on wall with dynamic standing balance activity. Instructed on UE tapping color or number of target CGA. Increased difficulty by intermixing color/number instruction. Pt able to correctly identify and reach to tap wall target >85% of the time. Level surface, airex pad,  wedge x15 targets. Intermittent MinA for maintaining balance 2/2 posterior lean on wedge.   Bowling activity for balance with dynamic stepping and UE coordination. x4 rounds CGA.  Pt able to improve target accuracy knocking over more pins each round. Unable to understand rolling instruction and under hand tossed small ball towards pins.  Pt in bed with bed alarm set, call bell within reach. RN notified pt was requesting to call his wife.   Therapy Documentation Precautions:  Precautions Precautions: Sternal, Fall Precaution Comments: global aphasia, DYS 2 diet, full supervision with thin liquids Restrictions Weight Bearing Restrictions: No RUE Weight Bearing: Weight bearing as tolerated LUE Weight Bearing: Weight bearing as tolerated Other Position/Activity Restrictions: sternal    Pain: Pain Assessment Pain Scale: 0-10 Pain Score: 0-No pain Faces Pain Scale: No hurt   Therapy/Group: Individual Therapy  Leocadio Heal 12/05/2020, 12:04 PM

## 2020-12-05 NOTE — Progress Notes (Signed)
Occupational Therapy Session Note  Patient Details  Name: Jesse [name] MRN: [id number] Date of Birth: June 02, 1975  Today's Date: 12/05/2020 OT Individual Time: 0907-1000 OT Individual Time Calculation (min): 53 min  + 26 min   Short Term Goals: Week 1:  OT Short Term Goal 1 (Week 1): STGs=LTGs due to ELOS  Skilled Therapeutic Interventions/Progress Updates:     Session 1 (0907-1000): Pt received semi-reclined in bed finishing breakfast with NT present, no s/sx pain throughout, agreeable to therapy. Session focus on self-care retraining, activity tolerance, func cog, problem solving, command follow, dynamic standing balance, func transfers in prep for improved ADL/IADL/func mobility performance + decreased caregiver burden. MD/RN present for morning med pass and assessment. Came to sitting EOB with close S and visual cue for initiation. Amb > sink CGA and no AD. Presented with toothpaste and pt unable to open requiring assistance - attempted to squirt in mouth req further assistance to put on tooth brush. Then able to compelte oral care with no additional cuing. Amb to and from [facility] nurses station to retrieve coffee per pt request with CGA for balance. Able to follow one step instructions to place coffee under spout and then later mix in desired ingredients. Amb back to room while holding coffee with CGA and no spillage. Retrieved shorts and shirt from dresser with mod cuing to select appropriate items. Doffed/donned shirt with S, attempted to don shirt and later face mask over legs as pants req mod Vcs to correct. Pt with difficulty following sternal precautions 2/2 receptive language deficits Doffed/donned pants with mod tactile cues to doff pants prior to donning new ones. Pt is able to brush hair when presented with brush. Amb back to ortho gym to complete Trail Making Part A + Tracing Shapes games while standing at [employer]. Req max visual cuing to accurately touch numbers in number order - but pt  noted to be able to select the correct number at times without cuing. However, pt would incorrectly state the name of the name. Pt traced various shapes with overall 85% accuracy after initial demonstration. Amb transfer back to bed, pt cont to decline need for restroom. Returned to supine close S. Pt able to read his name and accurately spell his name "[name]" when written for him. He was able to copy his first name legibly, but not his last name. He became slightly tearful and frustrated "why am I like this". Emotional support and therapeutic support utilized throughout conversation.  Pt left with HOB at 30 degrees with bed alarm engaged, call bell in reach, and all immediate needs met.   Session 2 [phone]): Pt received semi-reclined in bed, no s/sx pain, agreeable to therapy. Session focus on func transfers, activity tolerance, multistep command follow, func cognition in prep for improved ADL/IADL/func mobility performance + decreased caregiver burden. Came to sitting EOB with close S and min visual cues for initiation. STS and amb to and from gym CGA for balance. Min Vcs for safe obstacle navigation. Pt completed various level difficulty peg designs, copying from printed picture. He was able to complete 3 level 2 difficulty designs with mod multimodal cuining for initiation and correct spacing of pegs. Noted to self-correct, saying "this isn't right." Pt require max assist to accurately complete level 3 difficulty design. Pt amb back to room same manner as before and returned to supine close S.  Pt left with HOB at 30 degrees with bed alarm engaged, call bell in reach, and all immediate needs met.  Therapy Documentation Precautions:  Precautions Precautions: Sternal, Fall Precaution Comments: global aphasia, DYS 2 diet, full supervision with thin liquids Restrictions Weight Bearing Restrictions: No RUE Weight Bearing: Weight bearing as tolerated LUE Weight Bearing: Weight bearing as  tolerated Other Position/Activity Restrictions: sternal  Pain:   See session notes ADL: See Care Tool for more details.  Therapy/Group: Individual Therapy  Volanda Napoleon  MS, OTR/L  12/05/2020, 6:44 AM

## 2020-12-05 NOTE — Progress Notes (Signed)
Patient ID: Jesse [name], male   DOB: 1975/09/28, 45 y.o.   MRN: [id number]  New Patient Visit with Rema Fendt Tuesday February 10, 2021 9:00 AM   Primary Care at [facility] [address] [place] [address] [phone]  Lavera Guise, [place] [phone]

## 2020-12-06 DIAGNOSIS — D62 Acute posthemorrhagic anemia: Secondary | ICD-10-CM

## 2020-12-06 DIAGNOSIS — R339 Retention of urine, unspecified: Secondary | ICD-10-CM

## 2020-12-06 DIAGNOSIS — I6310 Cerebral infarction due to embolism of unspecified precerebral artery: Secondary | ICD-10-CM

## 2020-12-06 DIAGNOSIS — E871 Hypo-osmolality and hyponatremia: Secondary | ICD-10-CM

## 2020-12-06 LAB — GLUCOSE, CAPILLARY
Glucose-Capillary: 87 mg/dL (ref 70–99)
Glucose-Capillary: 101 mg/dL — ABNORMAL HIGH (ref 70–99)
Glucose-Capillary: 84 mg/dL (ref 70–99)
Glucose-Capillary: 122 mg/dL — ABNORMAL HIGH (ref 70–99)

## 2020-12-06 MED ORDER — TAMSULOSIN HCL 0.4 MG PO CAPS
0.8000 mg | ORAL_CAPSULE | Freq: Every day | ORAL | Status: DC
  Administered 2020-12-06 – 2020-12-10 (×5): 0.8 mg via ORAL
  Filled 2020-12-06 (×5): qty 2

## 2020-12-06 NOTE — Progress Notes (Signed)
PROGRESS NOTE   Subjective/Complaints: Patient seen sitting up in bed this morning.  He states he slept well overnight.  He denies complaints.  ROS: Appears to deny CP, shortness of breath, nausea, vomiting or diarrhea.  Objective:   No results found. No results for input(s): WBC, HGB, HCT, PLT in the last 72 hours.  Recent Labs    12/05/20 2204  NA 129*  K 3.9  CL 96*  CO2 24  GLUCOSE 131*  BUN 26*  CREATININE 1.28*  CALCIUM 8.3*     Intake/Output Summary (Last 24 hours) at 12/06/2020 1518 Last data filed at 12/06/2020 1330 Gross per 24 hour  Intake 520 ml  Output 3550 ml  Net -3030 ml      Pressure Injury 11/14/20 Vertebral column Stage 1 -  Intact skin with non-blanchable redness of a localized area usually over a bony prominence. 3x4 area w/ scab on outside and pink center on bony prominince (Active)  11/14/20 0800  Location: Vertebral column  Location Orientation:   Staging: Stage 1 -  Intact skin with non-blanchable redness of a localized area usually over a bony prominence.  Wound Description (Comments): 3x4 area w/ scab on outside and pink center on bony prominince  Present on Admission: Yes    Physical Exam: Vital Signs Blood pressure 118/79, pulse 88, temperature 97.6 F (36.4 C), temperature source Oral, resp. rate 18, height 6\' 2"  (1.88 m), weight 60 kg, SpO2 98 %. Constitutional: No distress . Vital signs reviewed. HENT: Normocephalic.  Atraumatic. Eyes: EOMI. No discharge. Cardiovascular: No JVD.  RRR. Respiratory: Normal effort.  No stridor.  Bilateral clear to auscultation. GI: Non-distended.  BS +. Skin: Warm and dry.  Intact. Psych: Normal mood.  Normal behavior. Musc: No edema in extremities.  No tenderness in extremities. Neuro: Alert Motor: Grossly 5/5 throughout  Assessment/Plan: 1. Functional deficits which require 3+ hours per day of interdisciplinary therapy in a comprehensive  inpatient rehab setting. Physiatrist is providing close team supervision and 24 hour management of active medical problems listed below. Physiatrist and rehab team continue to assess barriers to discharge/monitor patient progress toward functional and medical goals  Care Tool:  Bathing    Body parts bathed by patient: Face   Body parts bathed by helper: Front perineal area, Buttocks     Bathing assist Assist Level: Contact Guard/Touching assist (standing at sink)     Upper Body Dressing/Undressing Upper body dressing   What is the patient wearing?: Pull over shirt    Upper body assist Assist Level: Contact Guard/Touching assist    Lower Body Dressing/Undressing Lower body dressing      What is the patient wearing?: Pants     Lower body assist Assist for lower body dressing: Contact Guard/Touching assist     Toileting Toileting Toileting Activity did not occur (Clothing management and hygiene only): N/A (no void or bm)  Toileting assist Assist for toileting: Maximal Assistance - Patient 25 - 49%     Transfers Chair/bed transfer  Transfers assist     Chair/bed transfer assist level: Minimal Assistance - Patient > 75%     Locomotion Ambulation   Ambulation assist  Assist level: Minimal Assistance - Patient > 75% Assistive device: Hand held assist Max distance: 429   Walk 10 feet activity   Assist     Assist level: Minimal Assistance - Patient > 75% Assistive device: Hand held assist   Walk 50 feet activity   Assist    Assist level: Minimal Assistance - Patient > 75% Assistive device: Hand held assist    Walk 150 feet activity   Assist    Assist level: Minimal Assistance - Patient > 75% Assistive device: Hand held assist    Walk 10 feet on uneven surface  activity   Assist Walk 10 feet on uneven surfaces activity did not occur: Safety/medical concerns         Wheelchair     Assist Will patient use wheelchair at  discharge?: No   Wheelchair activity did not occur: N/A         Wheelchair 50 feet with 2 turns activity    Assist    Wheelchair 50 feet with 2 turns activity did not occur: N/A       Wheelchair 150 feet activity     Assist  Wheelchair 150 feet activity did not occur: N/A       Blood pressure 118/79, pulse 88, temperature 97.6 F (36.4 C), temperature source Oral, resp. rate 18, height 6\' 2"  (1.88 m), weight 60 kg, SpO2 98 %.    Medical Problem List and Plan: 1.  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  Antithrombotics: -DVT/anticoagulation:  Pharmaceutical: Lovenox             -antiplatelet therapy: ASA 3. Pain Management:  Oxycodone 4. Mood: LCSW to follow for evaluation and support.             -antipsychotic agents: N/A 5. Neuropsych: This patient is not capable of making decisions on his own behalf. 6. Skin/Wound Care:  Routine pressure-relief measures. -- Monitor wound for healing. 7. Fluids/Electrolytes/Nutrition: Monitor I's/O. 8.  COVID PNA: Completed course of baricitinib  9.  HTN: Monitor blood pressures 3 times daily.  Continue Coreg and hydralazine. Vitals:   12/06/20 0449 12/06/20 1319  BP: 106/66 118/79  Pulse: 70 88  Resp: 18 18  Temp: 98.2 F (36.8 C) 97.6 F (36.4 C)  SpO2: 100% 98%    Controlled on 8/13 10.  Post stroke dysphagia: Tolerating dysphagia 2, thin liquids without aspiration. 11.  Anxiety/agitation: Will continue ativan prn. D/c haldol--last used 07/31. Continue SSI while on tube feeds.   -appears much improved.  12.  Urinary retention no obvious meds intake -continue  flomax monitor BP , increased on 8/13  Urecholine DC'd due to CAD 13. Acute blood loss anemia  Hemoglobin 10.5 on 8/8, labs ordered for Monday 14.  Hyponatremia  Sodium 129 on 8/12, labs are for tomorrow  LOS: 9 days A FACE TO FACE EVALUATION WAS PERFORMED  Jesse [name] 10/12 12/06/2020, 3:18 PM

## 2020-12-06 NOTE — Progress Notes (Signed)
Occupational Therapy Session Note  Patient Details  Name: Jesse [name] MRN: [id number] Date of Birth: [birth year]  Today's Date: 12/06/2020 OT Individual Time: 1950-9326 OT Individual Time Calculation (min): 24 min + 24 min   Short Term Goals: Week 1:  OT Short Term Goal 1 (Week 1): STGs=LTGs due to ELOS  Skilled Therapeutic Interventions/Progress Updates:    Session 1 ([id number]): Pt received semi-reclined in bed with nursing staff present, no c/o pain, agreeable to therapy at earlier than scheduled time. Session focus on self-care retraining, activity tolerance, dynamic standing balance, IADL retraining, leisure participation in prep for improved ADL/IADL/func mobility performance + decreased caregiver burden. Finished breakfast with close S and cues to slow down and alternate sips/liquids per SLP sheet. Came to sitting EOB with close S and amb > sink CGA no AD with min Vcs for initiation. Presented toothbrush/toothpaste - pt initially spreading toothpaste over hands like lotions, req assist to correct and place on tooth brush. Completed oral care thoroughly with no additional cues. Washed face and combed hair with no additional cuing when presented with items. Donned shorts with min Vcs for correct orientation - pt recognizing error and able to correct. Found additional glasses in drawer for pt to try on - as pt reports in previous session "I can't see." Pt cont to decline need to toilet/shower. Question if pt would prefer male therapist for these tasks. Amb > gym > nurses station > room with CGA and no AD - 1 small instance of bumping into L sided obstacle. Pt able to make 15 baskets and retrieve ball from ground with CGA and no overt LOB. When questioned, states he plays basketball. Finally, amb to coffee machine after performing hand hygiene to retrieve coffee with mod Vcs for correct sequencing of steps. Amb back to room > bed same manner as before.   Pt left with HOB > 30 with bed alarm  engaged, call bell in reach, and all immediate needs met.   Session 2 [phone]): Pt received semi-reclined in bed with nursing staff present, no c/o pain throughout session, agreeable to therapy. Session focus on self-care retraining, activity tolerance, func cognition, dynamic standing balance, func transfers, psychosocial health in prep for improved ADL/IADL/func mobility performance + decreased caregiver burden. Pt came to sitting EOB with close S + min Vcs for initiation. Denies need to toilet. Amb > atrium > outside patio with intermittent CGA for balance + close S + no AD. Pt req mod visual cues to hit correct elevator button. Noted good obstacle navigation in busy hospital environment. Pt navigated outdoor environment and brought to outdoor fountain for improved psychosocial wellbeing. Pt expressed appreciation for being outside. Noted improved spontaneous and appropriate conversational speech this date. Amb transfer back to room same manner as before and transitioned to supine.    Pt left with HOB > 30 degrees with bed alarm engaged, call bell in reach, and all immediate needs met.    Therapy Documentation Precautions:  Precautions Precautions: Sternal, Fall Precaution Comments: global aphasia, DYS 2 diet, full supervision with thin liquids Restrictions Weight Bearing Restrictions: No RUE Weight Bearing: Weight bearing as tolerated LUE Weight Bearing: Weight bearing as tolerated Other Position/Activity Restrictions: sternal  Pain: no c/o   ADL: See Care Tool for more details.   Therapy/Group: Individual Therapy  Volanda Napoleon MS, OTR/L  12/06/2020, 6:51 AM

## 2020-12-06 NOTE — Progress Notes (Signed)
Speech Language Pathology Daily Session Note  Patient Details  Name: Damel Querry MRN: [id number] Date of Birth: [birth year]  Today's Date: 12/06/2020 SLP Individual Time: 8937-3428 SLP Individual Time Calculation (min): 41 min  Short Term Goals: Week 2: SLP Short Term Goal 1 (Week 2): STG=LTG due to ELOS  Skilled Therapeutic Interventions: Pt seen for skilled ST with focus on communication and swallowing goals. Pt having just finished breakfast but agreeable to few bites Dys 3 solids to further assess oral phase. Pt noted with decreased mastication of solids before transit/swallow. Verbal and visual cues moderately effective in increasing mastication efficiency. No overt s/s aspiration with Dys 3 solids and thin liquid wash, remain Dys 2 at this time 2' ongoing oral phase impairments. SLP facilitated language task by having patient name family members in photo book, with min cues and extra time pt able to name them with 100% accuracy. Pt attempting to count 1-10 (saying "first" "second" "third"), unable to get past 3 today despite max cues. Pt becoming frustrated and repeating "I don't know what's wrong with me", provided gentle education and redirection. Pt completing card color sorting task requiring mod cues to understanding directions, however cues fading to Supervision as task progressed with 100% accuracy. Reviewed use of call button for all assistance/mobility. Pt provided Coke per request, left in bed with alarm set and all needs within reach. Cont ST POC.   Pain Pain Assessment Pain Scale: 0-10 Pain Score: 0-No pain  Therapy/Group: Individual Therapy  Tacey Ruiz 12/06/2020, 8:21 AM

## 2020-12-06 NOTE — Plan of Care (Signed)
  Problem: Consults Goal: RH STROKE PATIENT EDUCATION Description: See Patient Education module for education specifics  Outcome: Progressing   Problem: RH BOWEL ELIMINATION Goal: RH STG MANAGE BOWEL WITH ASSISTANCE Description: STG Manage Bowel with mod I  Assistance. Outcome: Progressing Goal: RH STG MANAGE BOWEL W/MEDICATION W/ASSISTANCE Description: STG Manage Bowel with Medication with mod I  Assistance. Outcome: Progressing   Problem: RH BLADDER ELIMINATION Goal: RH STG MANAGE BLADDER WITH ASSISTANCE Description: STG Manage Bladder With  no Assistance Outcome: Progressing   Problem: RH SKIN INTEGRITY Goal: RH STG SKIN FREE OF INFECTION/BREAKDOWN Description: With min assist Outcome: Progressing Goal: RH STG MAINTAIN SKIN INTEGRITY WITH ASSISTANCE Description: STG Maintain Skin Integrity With min  Assistance. Outcome: Progressing   Problem: RH SAFETY Goal: RH STG ADHERE TO SAFETY PRECAUTIONS W/ASSISTANCE/DEVICE Description: STG Adhere to Safety Precautions With cues/reminders  Assistance/Device. Outcome: Progressing   Problem: RH PAIN MANAGEMENT Goal: RH STG PAIN MANAGED AT OR BELOW PT'S PAIN GOAL Description: At or below level 4 Outcome: Progressing   Problem: RH KNOWLEDGE DEFICIT Goal: RH STG INCREASE KNOWLEDGE OF HYPERTENSION Description: Patient will be able to manage HTN with medications and dietary modifications using handouts and educational tools independently Outcome: Progressing Goal: RH STG INCREASE KNOWLEDGE OF DYSPHAGIA/FLUID INTAKE Description: Patient will be able to manage dysphagia , medications and dietary modifications using handouts and educational resources independently Outcome: Progressing Goal: RH STG INCREASE KNOWLEDGE OF STROKE PROPHYLAXIS Description: Patient will be able to manage secondary stroke risks with medications and dietary modifications using handouts and educational resources independently Outcome: Progressing

## 2020-12-07 DIAGNOSIS — I10 Essential (primary) hypertension: Secondary | ICD-10-CM

## 2020-12-07 DIAGNOSIS — N179 Acute kidney failure, unspecified: Secondary | ICD-10-CM

## 2020-12-07 LAB — GLUCOSE, CAPILLARY
Glucose-Capillary: 104 mg/dL — ABNORMAL HIGH (ref 70–99)
Glucose-Capillary: 97 mg/dL (ref 70–99)
Glucose-Capillary: 108 mg/dL — ABNORMAL HIGH (ref 70–99)
Glucose-Capillary: 111 mg/dL — ABNORMAL HIGH (ref 70–99)

## 2020-12-07 LAB — BASIC METABOLIC PANEL
Anion gap: 8 (ref 5–15)
BUN: 18 mg/dL (ref 6–20)
CO2: 26 mmol/L (ref 22–32)
Calcium: 8.9 mg/dL (ref 8.9–10.3)
Chloride: 101 mmol/L (ref 98–111)
Creatinine, Ser: 1.26 mg/dL — ABNORMAL HIGH (ref 0.61–1.24)
GFR, Estimated: >60 mL/min (ref >60)
Glucose, Bld: 98 mg/dL (ref 70–99)
Potassium: 4.1 mmol/L (ref 3.5–5.1)
Sodium: 135 mmol/L (ref 135–145)

## 2020-12-07 NOTE — Progress Notes (Signed)
PROGRESS NOTE   Subjective/Complaints: Patient seen sitting up in bed this morning.  He states he slept well overnight.  No reported issues overnight.  ROS: Limited due to cognition.  Objective:   No results found. No results for input(s): WBC, HGB, HCT, PLT in the last 72 hours.  Recent Labs    12/05/20 2204 12/07/20 0658  NA 129* 135  K 3.9 4.1  CL 96* 101  CO2 24 26  GLUCOSE 131* 98  BUN 26* 18  CREATININE 1.28* 1.26*  CALCIUM 8.3* 8.9      Intake/Output Summary (Last 24 hours) at 12/07/2020 2130 Last data filed at 12/07/2020 1900 Gross per 24 hour  Intake 840 ml  Output 2247 ml  Net -1407 ml      Pressure Injury 11/14/20 Vertebral column Stage 1 -  Intact skin with non-blanchable redness of a localized area usually over a bony prominence. 3x4 area w/ scab on outside and pink center on bony prominince (Active)  11/14/20 0800  Location: Vertebral column  Location Orientation:   Staging: Stage 1 -  Intact skin with non-blanchable redness of a localized area usually over a bony prominence.  Wound Description (Comments): 3x4 area w/ scab on outside and pink center on bony prominince  Present on Admission: Yes    Physical Exam: Vital Signs Blood pressure 95/62, pulse 87, temperature 98.7 F (37.1 C), temperature source Oral, resp. rate 18, height 6\' 2"  (1.88 m), weight 59.8 kg, SpO2 98 %. Constitutional: No distress . Vital signs reviewed. HENT: Normocephalic.  Atraumatic. Eyes: EOMI. No discharge. Cardiovascular: No JVD.  RRR. Respiratory: Normal effort.  No stridor.  Bilateral clear to auscultation. GI: Non-distended.  BS +. Skin: Warm and dry.  Intact. Psych: Normal mood.  Normal behavior. Musc: No edema in extremities.  No tenderness in extremities. Neuro: Alert Motor: Grossly 5/5 throughout, unchanged  Assessment/Plan: 1. Functional deficits which require 3+ hours per day of interdisciplinary  therapy in a comprehensive inpatient rehab setting. Physiatrist is providing close team supervision and 24 hour management of active medical problems listed below. Physiatrist and rehab team continue to assess barriers to discharge/monitor patient progress toward functional and medical goals  Care Tool:  Bathing    Body parts bathed by patient: Face   Body parts bathed by helper: Front perineal area, Buttocks     Bathing assist Assist Level: Contact Guard/Touching assist (standing at sink)     Upper Body Dressing/Undressing Upper body dressing   What is the patient wearing?: Pull over shirt    Upper body assist Assist Level: Contact Guard/Touching assist    Lower Body Dressing/Undressing Lower body dressing      What is the patient wearing?: Pants     Lower body assist Assist for lower body dressing: Contact Guard/Touching assist     Toileting Toileting Toileting Activity did not occur (Clothing management and hygiene only): N/A (no void or bm)  Toileting assist Assist for toileting: Maximal Assistance - Patient 25 - 49%     Transfers Chair/bed transfer  Transfers assist     Chair/bed transfer assist level: Minimal Assistance - Patient > 75%     Locomotion Ambulation  Ambulation assist      Assist level: Minimal Assistance - Patient > 75% Assistive device: Hand held assist Max distance: 429   Walk 10 feet activity   Assist     Assist level: Minimal Assistance - Patient > 75% Assistive device: Hand held assist   Walk 50 feet activity   Assist    Assist level: Minimal Assistance - Patient > 75% Assistive device: Hand held assist    Walk 150 feet activity   Assist    Assist level: Minimal Assistance - Patient > 75% Assistive device: Hand held assist    Walk 10 feet on uneven surface  activity   Assist Walk 10 feet on uneven surfaces activity did not occur: Safety/medical concerns         Wheelchair     Assist Will  patient use wheelchair at discharge?: No   Wheelchair activity did not occur: N/A         Wheelchair 50 feet with 2 turns activity    Assist    Wheelchair 50 feet with 2 turns activity did not occur: N/A       Wheelchair 150 feet activity     Assist  Wheelchair 150 feet activity did not occur: N/A       Blood pressure 95/62, pulse 87, temperature 98.7 F (37.1 C), temperature source Oral, resp. rate 18, height 6\' 2"  (1.88 m), weight 59.8 kg, SpO2 98 %.    Medical Problem List and Plan: 1.  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  Antithrombotics: -DVT/anticoagulation:  Pharmaceutical: Lovenox             -antiplatelet therapy: ASA 3. Pain Management:  Oxycodone 4. Mood: LCSW to follow for evaluation and support.             -antipsychotic agents: N/A 5. Neuropsych: This patient is not capable of making decisions on his own behalf. 6. Skin/Wound Care:  Routine pressure-relief measures. -- Monitor wound for healing. 7. Fluids/Electrolytes/Nutrition: Monitor I's/O. 8.  COVID PNA: Completed course of baricitinib  9.  HTN: Monitor blood pressures 3 times daily.  Continue Coreg and hydralazine. Vitals:   12/07/20 1807 12/07/20 2017  BP: 119/88 95/62  Pulse: 97 87  Resp:  18  Temp:  98.7 F (37.1 C)  SpO2:  98%    Controlled on 8/14 10.  Post stroke dysphagia: Tolerating dysphagia 2, thin liquids without aspiration. 11.  Anxiety/agitation: Will continue ativan prn. D/c haldol--last used 07/31. Continue SSI while on tube feeds.   -appears much improved.  12.  Urinary retention no obvious meds intake -continue  flomax monitor BP , increased on 8/13  Urecholine DC'd due to CAD 13. Acute blood loss anemia  Hemoglobin 10.5 on 8/8, labs ordered for tomorrow 14.  Hyponatremia  Sodium 135 on 8/14 15.  AKI  Creatinine 1.26 on 8/14, continue to monitor  LOS: 10 days A FACE TO FACE EVALUATION  WAS PERFORMED  Piya Mesch 9/14 12/07/2020, 9:30 PM

## 2020-12-07 NOTE — Progress Notes (Signed)
Occupational Therapy Session Note  Patient Details  Name: Jesse [name] MRN: [id number] Date of Birth: 1975-09-29  Today's Date: 12/08/2020 OT Individual Time: 1638-4665 OT Individual Time Calculation (min): 71 min   Short Term Goals: Week 1:  OT Short Term Goal 1 (Week 1): STGs=LTGs due to ELOS  Skilled Therapeutic Interventions/Progress Updates:    Pt greeted in bed with no c/o pain. Multiple family members present for family education today. Provided family with Jesse handout regarding pts sternal precautions and we discussed functional implications at home. Pt ambulated with CGA and no AD down the hallway to the tub shower room. Noted some Rt inattention when changing directions in the hallway and when locating the tub shower room. He will have Jesse TTB for home, reviewed TTB transfers with family and 2 family members had hands on practice assisting pt with this transfer and cuing pt appropriately for sternal precaution adherence. Discussed curtain placement, purchase of nonslip treads for shower floor, and wearing nonslip footwear in/out of shower. Also educated family on pts full supervision goals during BADL routine. He returned to the room and educated wife [name] on toilet transfers. Cleared her on safety plan to assist after she demonstrated how to safely assist pt during session. Printed out an Architectural technologist. Discussed how to apply stated strategies during ADL/IADL participation at home. Both pt and wife appreciative of education. Notified SW that family was requesting order of 3:1 (to elevate height of toilet) as well as TTB. Left pt in bed at close of session, all needs within reach and bed alarm set as family was planning to leave in Jesse few minutes.   Therapy Documentation Precautions:  Precautions Precautions: Sternal, Fall Precaution Comments: global aphasia, DYS 2 diet, full supervision with thin liquids Restrictions Weight Bearing Restrictions: No RUE  Weight Bearing: Weight bearing as tolerated LUE Weight Bearing: Weight bearing as tolerated Other Position/Activity Restrictions: sternal  ADL: ADL Grooming: Minimal assistance, Maximal cueing Where Assessed-Grooming: Sitting at sink Upper Body Bathing: Moderate assistance, Maximal cueing Where Assessed-Upper Body Bathing: Sitting at sink Lower Body Bathing: Minimal assistance, Maximal cueing Where Assessed-Lower Body Bathing: Sitting at sink, Standing at sink Upper Body Dressing: Maximal cueing, Moderate assistance Where Assessed-Upper Body Dressing: Sitting at sink, Standing at sink Lower Body Dressing: Maximal cueing, Maximal assistance Where Assessed-Lower Body Dressing: Sitting at sink, Standing at sink     Therapy/Group: Individual Therapy  Jesse [name] Jesse [name] 12/08/2020, 12:34 PM

## 2020-12-08 LAB — GLUCOSE, CAPILLARY
Glucose-Capillary: 96 mg/dL (ref 70–99)
Glucose-Capillary: 92 mg/dL (ref 70–99)
Glucose-Capillary: 123 mg/dL — ABNORMAL HIGH (ref 70–99)
Glucose-Capillary: 104 mg/dL — ABNORMAL HIGH (ref 70–99)

## 2020-12-08 LAB — BASIC METABOLIC PANEL
Anion gap: 9 (ref 5–15)
BUN: 18 mg/dL (ref 6–20)
CO2: 25 mmol/L (ref 22–32)
Calcium: 8.9 mg/dL (ref 8.9–10.3)
Chloride: 103 mmol/L (ref 98–111)
Creatinine, Ser: 1.16 mg/dL (ref 0.61–1.24)
GFR, Estimated: >60 mL/min (ref >60)
Glucose, Bld: 97 mg/dL (ref 70–99)
Potassium: 4.3 mmol/L (ref 3.5–5.1)
Sodium: 137 mmol/L (ref 135–145)

## 2020-12-08 LAB — CBC
HCT: 34.9 % — ABNORMAL LOW (ref 39.0–52.0)
Hemoglobin: 11 g/dL — ABNORMAL LOW (ref 13.0–17.0)
MCH: 31.1 pg (ref 26.0–34.0)
MCHC: 31.5 g/dL (ref 30.0–36.0)
MCV: 98.6 fL (ref 80.0–100.0)
Platelets: 243 10*3/uL (ref 150–400)
RBC: 3.54 MIL/uL — ABNORMAL LOW (ref 4.22–5.81)
RDW: 13.5 % (ref 11.5–15.5)
WBC: 5.3 10*3/uL (ref 4.0–10.5)
nRBC: 0 % (ref 0.0–0.2)

## 2020-12-08 NOTE — Progress Notes (Signed)
Speech Language Pathology Daily Session Note  Patient Details  Name: Jesse [name] MRN: [id number] Date of Birth: 1975-05-31  Today's Date: 12/08/2020 SLP Individual Time: 0830-0930 SLP Individual Time Calculation (min): 60 min  Short Term Goals: Week 2: SLP Short Term Goal 1 (Week 2): STG=LTG due to ELOS  Skilled Therapeutic Interventions: Patient agreeable to skilled ST intervention with focus on family education. Patient's spouse, father, and brothers were present for today's session. SLP provided education and demonstration on expressive/receptive language, motor apraxia impacting speech and ADL efficiency, cognitive-communication deficits, diet recommendations, communication strategies, tasks for home exercises from a language and cognitive standpoint (automatic speech, object naming/comprehension, yes/no questions, ADLs, sorting, sequencing, organizing with common household objects, colors, etc) and recommendations for continued SLP services after discharge. Provided family with multiple copies of education handouts and yes/no communication board. All questions were addressed to satisfaction. Patient was passed off to PT for next session. Continue per current plan of care.      Pain Pain Assessment Pain Scale: 0-10 Pain Score: 0-No pain  Therapy/Group: Individual Therapy  Tamala Ser 12/08/2020, 9:34 AM

## 2020-12-08 NOTE — Progress Notes (Signed)
PROGRESS NOTE   Subjective/Complaints:  No issues overnite  Family at bedside   ROS: Limited due to cognition.  Objective:   No results found. Recent Labs    12/08/20 0715  WBC 5.3  HGB 11.0*  HCT 34.9*  PLT 243    Recent Labs    12/07/20 0658 12/08/20 0715  NA 135 137  K 4.1 4.3  CL 101 103  CO2 26 25  GLUCOSE 98 97  BUN 18 18  CREATININE 1.26* 1.16  CALCIUM 8.9 8.9      Intake/Output Summary (Last 24 hours) at 12/08/2020 0905 Last data filed at 12/08/2020 0758 Gross per 24 hour  Intake 840 ml  Output 2820 ml  Net -1980 ml      Pressure Injury 11/14/20 Vertebral column Stage 1 -  Intact skin with non-blanchable redness of a localized area usually over a bony prominence. 3x4 area w/ scab on outside and pink center on bony prominince (Active)  11/14/20 0800  Location: Vertebral column  Location Orientation:   Staging: Stage 1 -  Intact skin with non-blanchable redness of a localized area usually over a bony prominence.  Wound Description (Comments): 3x4 area w/ scab on outside and pink center on bony prominince  Present on Admission: Yes    Physical Exam: Vital Signs Blood pressure 126/86, pulse 86, temperature 98.1 F (36.7 C), temperature source Oral, resp. rate 16, height 6\' 2"  (1.88 m), weight 59.8 kg, SpO2 100 %.  General: No acute distress Mood and affect are appropriate Heart: Regular rate and rhythm no rubs murmurs or extra sounds Lungs: Clear to auscultation, breathing unlabored, no rales or wheezes Abdomen: Positive bowel sounds, soft nontender to palpation, nondistended Extremities: No clubbing, cyanosis, or edema Skin: No evidence of breakdown, no evidence of rash Neurologic: Cranial nerves II through XII intact, motor strength is 5/5 in bilateral deltoid, bicep, tricep, grip, hip flexor, knee extensors, ankle dorsiflexor and plantar flexor- needs visual cuing to complete  Sensory  exam limited by aphasia  Cerebellar exam normal finger to nose to finger as well as heel to shin in bilateral upper and lower extremities- needs hand over hand cuing  Musculoskeletal: Full range of motion in all 4 extremities. No joint swelling   Assessment/Plan: 1. Functional deficits which require 3+ hours per day of interdisciplinary therapy in a comprehensive inpatient rehab setting. Physiatrist is providing close team supervision and 24 hour management of active medical problems listed below. Physiatrist and rehab team continue to assess barriers to discharge/monitor patient progress toward functional and medical goals  Care Tool:  Bathing    Body parts bathed by patient: Face   Body parts bathed by helper: Front perineal area, Buttocks     Bathing assist Assist Level: Contact Guard/Touching assist (standing at sink)     Upper Body Dressing/Undressing Upper body dressing   What is the patient wearing?: Pull over shirt    Upper body assist Assist Level: Contact Guard/Touching assist    Lower Body Dressing/Undressing Lower body dressing      What is the patient wearing?: Pants     Lower body assist Assist for lower body dressing: Contact Guard/Touching assist  Toileting Toileting Toileting Activity did not occur Press photographer and hygiene only): N/A (no void or bm)  Toileting assist Assist for toileting: Maximal Assistance - Patient 25 - 49%     Transfers Chair/bed transfer  Transfers assist     Chair/bed transfer assist level: Minimal Assistance - Patient > 75%     Locomotion Ambulation   Ambulation assist      Assist level: Minimal Assistance - Patient > 75% Assistive device: Hand held assist Max distance: 429   Walk 10 feet activity   Assist     Assist level: Minimal Assistance - Patient > 75% Assistive device: Hand held assist   Walk 50 feet activity   Assist    Assist level: Minimal Assistance - Patient > 75% Assistive  device: Hand held assist    Walk 150 feet activity   Assist    Assist level: Minimal Assistance - Patient > 75% Assistive device: Hand held assist    Walk 10 feet on uneven surface  activity   Assist Walk 10 feet on uneven surfaces activity did not occur: Safety/medical concerns         Wheelchair     Assist Will patient use wheelchair at discharge?: No   Wheelchair activity did not occur: N/A         Wheelchair 50 feet with 2 turns activity    Assist    Wheelchair 50 feet with 2 turns activity did not occur: N/A       Wheelchair 150 feet activity     Assist  Wheelchair 150 feet activity did not occur: N/A       Blood pressure 126/86, pulse 86, temperature 98.1 F (36.7 C), temperature source Oral, resp. rate 16, height 6\' 2"  (1.88 m), weight 59.8 kg, SpO2 100 %.    Medical Problem List and Plan: 1.  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  Antithrombotics: -DVT/anticoagulation:  Pharmaceutical: Lovenox             -antiplatelet therapy: ASA 3. Pain Management:  Oxycodone 4. Mood: LCSW to follow for evaluation and support.             -antipsychotic agents: N/A 5. Neuropsych: This patient is not capable of making decisions on his own behalf. 6. Skin/Wound Care:  Routine pressure-relief measures. -- Monitor wound for healing. 7. Fluids/Electrolytes/Nutrition: Monitor I's/O. 8.  COVID PNA: Completed course of baricitinib  9.  HTN: Monitor blood pressures 3 times daily.  Continue Coreg and hydralazine. Vitals:   12/07/20 2017 12/08/20 0538  BP: 95/62 126/86  Pulse: 87 86  Resp: 18 16  Temp: 98.7 F (37.1 C) 98.1 F (36.7 C)  SpO2: 98% 100%    Controlled on 8/15 10.  Post stroke dysphagia: Tolerating dysphagia 2, thin liquids without aspiration. 11.  Anxiety/agitation: Will  continue ativan prn. D/c haldol--last used 07/31. Continue SSI while on tube feeds.   -appears much improved.  12.  Urinary retention no obvious meds intake -continue  flomax monitor BP , increased on 8/13  Urecholine DC'd due to CAD 13. Acute blood loss anemia  Hemoglobin 10.5 on 8/8, labs ordered for tomorrow 14.  Hyponatremia  Resolved Na+ 137 15.  AKI  Resolved Creat normal today   LOS: 11 days A FACE TO FACE EVALUATION WAS PERFORMED  10/8 Kendricks Reap  12/08/2020, 9:05 AM

## 2020-12-08 NOTE — Progress Notes (Signed)
Nutrition Follow-up  DOCUMENTATION CODES:   Severe malnutrition in context of chronic illness, Underweight  INTERVENTION:  Continue Ensure Enlive po BID, each supplement provides 350 kcal and 20 grams of protein   Encourage adequate PO intake.   NUTRITION DIAGNOSIS:   Severe Malnutrition related to chronic illness as evidenced by severe fat depletion, severe muscle depletion; ongoing  GOAL:   Patient will meet greater than or equal to 90% of their needs; met  MONITOR:   PO intake, Supplement acceptance, Skin, Weight trends, Labs, I & O's, Diet advancement  REASON FOR ASSESSMENT:   Consult Calorie Count  ASSESSMENT:   45 year old male with history of HTN originally admitted to [facility] on 11/04/2020 with aphasia, blurry vision in right eye and mental status changes. He was found to have aortic dissection extending into the right common carotid with narrowing of the true lumen as well as occlusion of small distal right M2 and proximal M3 MCA branch. He underwent emergent repair of ascending thoracic aortic dissection with resuspension of native aortic valve. Follow-up CT on 07/20 showed interval development of petechial hemorrhage with evolving posterior right MCA and PCA MCA infarct. Hospital course was significant for issues with severe agitation due to delirium as well as poor safety awareness with impulsivity. CIR was recommended due to functional decline. 07/22 he was noted to be hypotensive and tachycardic with agonal breathing.  CODE BLUE initiated and he was briefly intubated and noted to have aspiration PNA and COVID positive. Cortak placed due to poor p.o. intake and for nutritional support secondary to severe malnutrition. Diet advanced to dysphagia 2 diet with thin liquids. Cortrak removed 8/6.   Meal completion has been 100%. Pt has been tolerating his PO diet. Pt currently has Ensure ordered and has been consuming them. RD to continue with current orders to aid in caloric and  protein needs.   Labs and medications reviewed.   Diet Order:   Diet Order             DIET DYS 2 Room service appropriate? No; Fluid consistency: Thin  Diet effective now                   EDUCATION NEEDS:   Not appropriate for education at this time  Skin:  Skin Assessment: Reviewed RN Assessment Skin Integrity Issues:: Stage I Stage I: vertebral column  Last BM:  8/13  Height:   Ht Readings from Last 1 Encounters:  11/27/20 6' 2"  (1.88 m)    Weight:   Wt Readings from Last 1 Encounters:  12/07/20 59.8 kg    Ideal Body Weight:  86.36 kg  BMI:  Body mass index is 16.93 kg/m.  Estimated Nutritional Needs:   Kcal:  2100-2300  Protein:  115-130 grams  Fluid:  >/= 2 L/day  Corrin Parker, MS, RD, LDN RD pager number/after hours weekend pager number on Amion.

## 2020-12-08 NOTE — Progress Notes (Signed)
Physical Therapy Session Note  Patient Details  Name: Jesse [name] MRN: [id number] Date of Birth: Feb 07, 1976  Today's Date: 12/08/2020 PT Individual Time: 0925-1010 PT Individual Time Calculation (min): 45 min   Short Term Goals: Week 1:  PT Short Term Goal 1 (Week 1): STG = LTG due to ELOS  Skilled Therapeutic Interventions/Progress Updates:  Patient supine in bed on entrance to room. Father, wife and 2 other male family members present for family education. Patient alert and agreeable to PT session. Patient denied pain during session.  Therapeutic Activity: Bed Mobility: Patient performed supine <> sit with Independence.  Transfers: Patient performed STS and SPVT transfers throughout session with supervision/ CGA. Provided verbal cues to family for proper positioning next to and/ or just behind pt to side and hand on gait belt with gait belt held loosely.  Gait Training:  Patient ambulated from room to/ from ortho gym covering >300 feet with no AD. Demonstrated to father and wife re: need for CGA with hand on gait belt without tug/ pull in any direction as pt needs no sensation of push/ pull off of BOS. Father relates he would like to take pt to gym and walk track with him. Father return demos good CGA. During amb, educated wife to pt's NBOS and decreased balance during amb requiring need for CGA with wife demonstrating understanding. Return amb to room with wife providing good CGA.   Neuromuscular Re-ed: NMR facilitated during session with focus on vision, cognition, sequence, recognition, problem solving and standing balance/ tolerance. Pt guided in use of BITS for number sequencing, letter sequencing, and bell object finding assessment. Pt demonstrates improved performance from start of care. Is able to sequence 1-8 prior to need for cueing assistance, and is able to sequence letters up to G prior to need for add'l cueing. During letter sequencing, pt numbers the letters in correct  sequence but is unable to voice the letters. 80% on bell finding assessment.   Pointed out to wife that pt's ability to scan larger visual field and look straight ahead at objects instead of looking to top of orbit to see.  NMR performed for improvements in motor control and coordination, balance, sequencing, judgement, and self confidence/ efficacy in performing all aspects of mobility at highest level of independence.   Patient supine  in bed at end of session with brakes locked, bed alarm set, and all needs within reach. Father and wife remaining for additional education from OT.      Therapy Documentation Precautions:  Precautions Precautions: Sternal, Fall Precaution Comments: global aphasia, DYS 2 diet, full supervision with thin liquids Restrictions Weight Bearing Restrictions: No RUE Weight Bearing: Weight bearing as tolerated LUE Weight Bearing: Weight bearing as tolerated Other Position/Activity Restrictions: sternal  Therapy/Group: Individual Therapy  Loel Dubonnet PT, DPT 12/08/2020, 4:50 PM

## 2020-12-09 LAB — GLUCOSE, CAPILLARY
Glucose-Capillary: 104 mg/dL — ABNORMAL HIGH (ref 70–99)
Glucose-Capillary: 100 mg/dL — ABNORMAL HIGH (ref 70–99)
Glucose-Capillary: 105 mg/dL — ABNORMAL HIGH (ref 70–99)
Glucose-Capillary: 119 mg/dL — ABNORMAL HIGH (ref 70–99)

## 2020-12-09 NOTE — Progress Notes (Signed)
Physical Therapy Session Note  Patient Details  Name: Jesse [name] MRN: [id number] Date of Birth: 1975-08-18  Today's Date: 12/09/2020 PT Individual Time: 9892-1194 PT Individual Time Calculation (min): 55 min   Short Term Goals: Week 1:  PT Short Term Goal 1 (Week 1): STG = LTG due to ELOS  Skilled Therapeutic Interventions/Progress Updates:  Patient supine in bed and asleep on entrance to room. Takes a brief period, but then patient alert and agreeable to PT session. Patient with no indication of pain throughout session.  Therapeutic Activity: Bed Mobility: Patient performed supine <> sit with Independence. Transfers: Patient performed STS and SPVT transfers with close supervision to no AD. Provided verbal cues for initiation.  Gait Training:  Performed with pt covering 524.5 feet with close supervision/ intermittent CGA for balance. Pt also performed ambulation with changing of direction and dynamic stepping/ reaching while walking throughout therapy gym constatnly for >9 minutes.   Neuromuscular Re-ed: NMR facilitated during session with focus on dynamic balance, problem solving, following instructions, object recognition. Pt guided in collection of green, red, blue, and yellow weighted balls placed throughout day room. Instructions provided to pt re: pt to ambulate gym and look to retrieve all 10 balls. Provided with visual cue of object. Instructed pt to find first one on own and then instructed to find specific color with pt able to pinpoint all balls with minor cues for placement but no cues for correct color choice.    For standing balance activity, pt is able to correctly choose called color from basket and return to bin. When asked for pt to verbalize color, pt demos association with correct vowel sounds on each color and is able to progress from "ed" to correctly stating "red" before allowed to put ball away. Good balance and reach to differing heights as well as to put  balls away to low height with no LOB.  Significant decrease in frustration as compared to previous week when pt cannot correctly recall what he wants to ay or if he does not immediately understand task. NMR performed for improvements in motor control and coordination, balance, sequencing, judgement, and self confidence/ efficacy in performing all aspects of mobility at highest level of independence.   Patient supine  in bed at end of session with brakes locked, bed alarm set, and all needs within reach.     Therapy Documentation Precautions:  Precautions Precautions: Sternal, Fall Precaution Comments: global aphasia, DYS 2 diet, full supervision with thin liquids Restrictions Weight Bearing Restrictions: No RUE Weight Bearing: Weight bearing as tolerated LUE Weight Bearing: Weight bearing as tolerated Other Position/Activity Restrictions: sternal  Therapy/Group: Individual Therapy  Loel Dubonnet PT, DPT 12/09/2020, 2:11 PM

## 2020-12-09 NOTE — Progress Notes (Signed)
Patient ID: Jesse [name], male   DOB: 10-Aug-1975, 45 y.o.   MRN: [id number]  SW covering for primary [place].   SW met with pt in room to provide handicap placard. SW confirms DME delivered. SW informed pt about outpatient therapies being set up by Margreta Journey for [facility]. SW reiterated to him the importance of discussing financial assistance at time of appointment, as well as the same for his new patient appointment as well.   Loralee Pacas, MSW, [place] Office: [phone] Cell: [phone] Fax: [phone]

## 2020-12-09 NOTE — Progress Notes (Signed)
Occupational Therapy Discharge Summary  Patient Details  Name: Jesse [name] MRN: [id number] Date of Birth: 05/17/1975  Today's Date: 12/10/2020 OT Individual Time: 0702-0755 OT Individual Time Calculation (min): 53 min    Patient has met 8 of 10 long term goals due to improved activity tolerance, improved balance, postural control, ability to compensate for deficits, improved attention, improved awareness, and improved coordination.  Patient to discharge at overall Supervision level.  Patient's care partner is independent to provide the necessary physical and cognitive assistance at discharge.    Reasons goals not met: Pt did not meet UBD goal 2/2 cont to need occasional multimodal cuing for initiation of task. Did not meet toileting goal 2/2 continuously declining need to toilet, req assist for clean up of incontinent BM 2/2 poor awareness.  Recommendation:  Patient will benefit from ongoing skilled OT services in home health setting to continue to advance functional skills in the area of BADL, iADL, and Reduce care partner burden.  Equipment: 3in1, TTB  Reasons for discharge: treatment goals met and discharge from hospital  Patient/family agrees with progress made and goals achieved: Yes  OT Discharge Precautions/Restrictions  Precautions Precautions: Sternal;Fall Precaution Comments: global aphasia Restrictions Weight Bearing Restrictions: No  Pain Pain Assessment Pain Score: 0-No pain ADL ADL Eating: Supervision/safety Where Assessed-Eating: Edge of bed Grooming: Supervision/safety, Minimal cueing Where Assessed-Grooming: Standing at sink, Sitting at sink, Edge of bed Upper Body Bathing: Supervision/safety, Minimal cueing Where Assessed-Upper Body Bathing: Sitting at sink Lower Body Bathing: Supervision/safety, Minimal cueing Where Assessed-Lower Body Bathing: Sitting at sink, Edge of bed Upper Body Dressing: Supervision/safety, Minimal cueing Where Assessed-Upper  Body Dressing: Edge of bed Lower Body Dressing: Supervision/safety, Minimal cueing Where Assessed-Lower Body Dressing: Sitting at sink, Standing at sink Toileting: Maximal assistance Where Assessed-Toileting: Glass blower/designer: Close supervision Armed forces technical officer Method: Counselling psychologist: Bedside commode, Grab bars Tub/Shower Transfer: Close supervison Clinical cytogeneticist Method: Optometrist: Facilities manager: Not assessed Vision Baseline Vision/History: Wears glasses Wears Glasses:  (unclear) Patient Visual Report: Other (comment) (difficult to assess due to global aphasia) Vision Assessment?: Vision impaired- to be further tested in functional context Additional Comments: difficult to assess due to global aphasia, states at times "I can't see" and noted to peer over top of glasses when working with near view tasks. Occasionally bumps into L sided obstacles during ambulation. Perception  Perception: Impaired Inattention/Neglect: Does not attend to left visual field Praxis Praxis: Impaired Praxis Impairment Details: Initiation;Ideation Praxis-Other Comments: Cont to req multimodal cuing for correct sequencing of UBD and oral care 2/2 apraxia. Cognition Overall Cognitive Status: Impaired/Different from baseline Arousal/Alertness: Awake/alert Orientation Level: Oriented to person Immediate Memory Recall:  (unable to assess due to global aphasia) Awareness: Impaired Sequencing: Impaired Safety/Judgment: Impaired Comments: unable to assess due to global aphasia Sensation Sensation Light Touch: Appears Intact Hot/Cold: Appears Intact Proprioception: Appears Intact Stereognosis: Appears Intact Additional Comments: unable to assess due to global aphasia Coordination Gross Motor Movements are Fluid and Coordinated: No Fine Motor Movements are Fluid and Coordinated: Yes (grossly WFL for ADL) Coordination and  Movement Description: Slight scissoring with turns, occasionally bumps into L sided obstacles with functional mobility Motor  Motor Motor: Motor apraxia Motor - Discharge Observations: Slight scissoring with turns, occasionally bumps into L sided obstacles with functional mobility Mobility  Bed Mobility Bed Mobility: Supine to Sit;Sit to Supine Supine to Sit: Independent Sit to Supine: Independent Transfers Sit to Stand: Supervision/Verbal cueing Stand to Sit: Supervision/Verbal cueing  Trunk/Postural Assessment  Cervical Assessment Cervical Assessment: Within Functional Limits Thoracic Assessment Thoracic Assessment: Within Functional Limits Lumbar Assessment Lumbar Assessment: Within Functional Limits Postural Control Postural Control: Deficits on evaluation Protective Responses: delayed  Balance Balance Balance Assessed: Yes Static Sitting Balance Static Sitting - Balance Support: No upper extremity supported;Feet supported Static Sitting - Level of Assistance: 7: Independent Dynamic Sitting Balance Dynamic Sitting - Balance Support: Feet supported;No upper extremity supported Dynamic Sitting - Level of Assistance: 5: Stand by assistance Dynamic Sitting Balance - Compensations: S Static Standing Balance Static Standing - Balance Support: No upper extremity supported;During functional activity Static Standing - Level of Assistance: 5: Stand by assistance Static Standing - Comment/# of Minutes: S Dynamic Standing Balance Dynamic Standing - Balance Support: During functional activity;No upper extremity supported Dynamic Standing - Comments: S Extremity/Trunk Assessment RUE Assessment RUE Assessment: Within Functional Limits Active Range of Motion (AROM) Comments: within limitations of sternal precautions General Strength Comments: within limitations of sternal precautions LUE Assessment LUE Assessment: Within Functional Limits Active Range of Motion (AROM) Comments:  within limitations of sternal precautions General Strength Comments: within limitations of sternal precautions  Session Note: Pt received semi-reclined in bed, no c/o pain, agreeable to therapy. Pt cont to decline need to toilet despite encouragement. Session focus on self-care retraining, activity tolerance, func cognition, memory, sequencing, IADL retraining, dynamic standing balance, command follow in prep for improved ADL/IADL/func mobility performance + decreased caregiver burden. Donned B shoes with distant S. Completed bed mobility with ind and STS with distant S. Amb > sink with close S and washes BUE/face with S for safety. Combs hair when presented with brush with no need for further sequencing. Amb to and from gym with overall close S and intermittent CGA for obstacle navigation. Standing at [place], completed 4 rounds of Memory Sequence game with both visual/word sequences of up to 4 items. Pt overall req repetition of each item to accurately complete game, but is able to complete several rounds of 3 word sequences independently. Amb > coffee machine after performing hand hygiene with CGA. Pt cont to req max visual and verbal cues to correctly sequence cup placement and pressing coffee button. Pt initially placing coffee lid under coffee spicket. Amb transfer back to room same manner as above. Returned to supine with ind and electing to eat breakfast with distant S to cue for SLP swallowing strategies. Came to sitting EOB and returned to sink same manner as above. When presented with toothbrush and toothpaste, pt able to accurately put toothpaste on toothbrush and complete oral care with only S for balance. This is an improvement compared to prior sessions where pt needed assist to set-up 2/2 apraxia. Amb back to gym with CGA and able to make 5 golf puts with CGA for balance and mod demonstrational cuing for instructions. Pt initially attempted to toss golf ball like a bowling ball. Amb transfer back to  bed and pt returned to semi-reclined with ind.  Pt left with HOB over 30 degrees with bed alarm engaged, call bell in reach, and all immediate needs met.    Volanda Napoleon MS, OTR/L  12/09/2020, 2:31 PM

## 2020-12-09 NOTE — Progress Notes (Signed)
Occupational Therapy Session Note  Patient Details  Name: Jesse [name] MRN: [id number] Date of Birth: 1975/05/30  Today's Date: 12/09/2020 OT Individual Time: 0700-0753 OT Individual Time Calculation (min): 53 min + 24 min   Short Term Goals: Week 1:  OT Short Term Goal 1 (Week 1): STGs=LTGs due to ELOS  Skilled Therapeutic Interventions/Progress Updates:     Session 1 (2111-5520): Pt received semi-reclined in bed, no s/sx pain throughout, agreeable to therapy. Session focus on self-care retraining, activity tolerance, func cognition, func transfers, command follow, dynamic standing balance, IADL retraining in prep for improved ADL/IADL/func mobility performance + decreased caregiver burden. Came to sitting EOB with min cuing for initiation. Short amb to and from w/c at sink with close S. Cont to decline need to toilet despite encouragement. Doffed shirt with  min A for initiation. Able to sponge bathe UB with S for sternal precaution adherence. Declined to further bathe. Presented with shirt and cont to attempt to don as pants, req min A to initiate over head. Donned pants + B shoes + combed hair with only S for balance/safety. Self-fed breakfast with S to cue for SLP swallowing strategies per safety sheet. Able to open all breakfast items and locate desired items appropriately. Amb to and from therapy kitchen with close S  and at times CGA for obstacle navigation. Presented with ingredients to make oatmeal. Pt req step-by-step instructions to complete, but able to appropriately use microwave when instructed with min visual cues to locate start button. Pt additionally, able to thoroughly clean dishes when instructed. Finally, retrieved coffee from coffee machine, noted improvement this date in ability to accurately use machine, but cont to req mod visual cuing for correct sequencing. Carried coffee cup back to room with close S and no spillage. Returned to supine mod I.  Pt left with HOB over 30  degrees with bed alarm engaged, call bell in reach, and all immediate needs met.     Session 2 [id number]): Pt received semi-reclined in bed, no c/o pain, agreeable to therapy. Session focus on self-care retraining, activity tolerance, func cog, community reintegration, command follow, dynamic standing balance in prep for improved ADL/IADL/func mobility performance + decreased caregiver burden. Adamantly declines need to toilet. Amb to and from hospital atrium with CGA for L sided obstacle navigation. When instructed to find items in gift shop, pt overall req max visual cues to locate. Pt was able to locate "sweet tarts, reeses, and skittles" after initial instruction when in front of candy wall. Additionally, practiced weaving in and out of tables/chairs in busy community environment with CGA for balance and navigation of L sided obstacles. Amb transfer back to bed and returned to supine with S.   Pt left with HOB > 30 with bed alarm engaged, nursing staff present, call bell in reach, and all immediate needs met.   Therapy Documentation Precautions:  Precautions Precautions: Sternal, Fall Precaution Comments: global aphasia, DYS 2 diet, full supervision with thin liquids Restrictions Weight Bearing Restrictions: No RUE Weight Bearing: Weight bearing as tolerated LUE Weight Bearing: Weight bearing as tolerated Other Position/Activity Restrictions: sternal  Pain:  No s/sx ADL: See Care Tool for more details. Therapy/Group: Individual Therapy  Volanda Napoleon MS, OTR/L  12/09/2020, 6:40 AM

## 2020-12-09 NOTE — Progress Notes (Signed)
Speech Language Pathology Daily Session Note  Patient Details  Name: Jesse [name] MRN: [id number] Date of Birth: December 25, 1975  Today's Date: 12/09/2020 SLP Individual Time: 1700-1749 SLP Individual Time Calculation (min): 43 min  Short Term Goals: Week 2: SLP Short Term Goal 1 (Week 2): STG=LTG due to ELOS  Skilled Therapeutic Interventions:Skilled ST services focused on cognitive skills. SLP facilitated basic problem solving and recall skills in card sorting task by colors, shapes and then numbers. Pt was able to confirm color name in response to yes/bo questions in 5/6 opportunities, increasing to 6/6 opportunities, confirm shape name via yes/no questions in 6/6 opportunities and confirm number name in 5/5 opportunities. Pt was able to name/count number cards with semantic errors, stating "first, second, third, fourth and fifth"  but unable to correct to repeat or express "one, two, three, four and five." Pt was able to sort by colors in a field of 6 with min A fade to mod I for problem solving, sort by shapes in a field of 6 with, max fads to supervision A verbal cues and then sort by number in and then out of order, mod I for problem solving. Pt was able to copy 6 out 9 letters in three letter word "cup" in x4 opportunities with ability to trace at word level. Pt was left in room with call bell within reach and bedalarm set. SLP recommends to continue skilled services.     Pain Pain Assessment Pain Score: 0-No pain  Therapy/Group: Individual Therapy  [name]  [facility] 12/09/2020, 12:47 PM

## 2020-12-09 NOTE — Progress Notes (Signed)
Physical Therapy Session Note  Patient Details  Name: Jesse [name] MRN: [id number] Date of Birth: 1975/10/24  Today's Date: 12/09/2020 PT Individual Time: 1100-1130 PT Individual Time Calculation (min): 30 min   Short Term Goals: Week 2:     Skilled Therapeutic Interventions/Progress Updates: Pt presents supine in bed and agreeable to therapy.  Pt transfers sup to sit w/ mod I.  Pt sat EOB for gait belt donning.  Pt transfers sit to stand w/ CGA and amb around bed for mask.  Pt demo'd slight scissoring w/ too quick turns, but no LOB and regained w/o increased assist from PT.  Pt amb multiple trials w/o AD up to 300', including changing directions.  Pt appropriate w/ turns approx. 30% of time when given 1 step cues for Turn right, or Turn Left.  Pt cued for visual scanning when amb and performed side to side scanning during gait as well as collecting cones at higher level to promote canning.  Pt amb w/ CGA but does decrease to close supervision w/ time and practice.  Pt amb on ramp and across mulch bed but does reach for rail occasionally.  Pt returned to room and transferred to bed.  Bed alarm on and all needs in reach.     Therapy Documentation Precautions:  Precautions Precautions: Sternal, Fall Precaution Comments: global aphasia, DYS 2 diet, full supervision with thin liquids Restrictions Weight Bearing Restrictions: No RUE Weight Bearing: Weight bearing as tolerated LUE Weight Bearing: Weight bearing as tolerated Other Position/Activity Restrictions: sternal General:   Vital Signs:   Pain:0/10       Therapy/Group: Individual Therapy  Lucio Edward 12/09/2020, 12:10 PM

## 2020-12-09 NOTE — Progress Notes (Signed)
Speech Language Pathology Discharge Summary  Patient Details  Name: Jesse [name] MRN: [id number] Date of Birth: 1975-08-21  Today's Date: 12/10/2020 SLP Individual Time: 1130-1200 SLP Individual Time Calculation (min): 30 min  Skilled Therapeutic Interventions: Patient agreeable to skilled ST intervention with focus on cognitive and communication goals. SLP facilitated card matching task using UNO cards. Patient successfully matched cards by number with 100% accuracy. He was known to verbally count which was approximately 50% accurate. Instead of producing "one, two, three..." he often produced "first, second, third..." today and was able to self correct approximately 25% of occurrences. Patient was able to count 1-10 with 90% accuracy. Answered basic yes/no questions with 100% accuracy and self corrected when he elicited a response he didn't initially intend. Achieved 80% accuracy with complex yes/no questions. He was able to follow 1-step directions with mod A verbal/visual cues and multiple attempts/repetition. Patient continues to exhibit improved spontaneous speech, expression of common sayings and responses, and improved ability to communicate needs in unstructured interactions. Experiences significant difficulty during structured expressive language tasks. Patient was left in bed with alarm activated and immediate needs within reach at end of session.   Patient has met 6 of 7 long term goals.  Patient to discharge at overall Mod level.  Reasons goals not met: Patient did not exhibit functional change with oral swallow as seen at bedside   Clinical Impression/Discharge Summary: Patient has made functional gains and has met 6 of 7 long-term goals this admission due to improved automatic speech, object identification, following simple 1-step directions, answering basic yes/no questions, and spontaneous speech. He exhibited most improvement in receptive language skills and is progressing toward  expressing basic needs with multimodal communication. Patient currently completing receptive language tasks with mod A verbal/visual cues, and overall mod A for expression with use multimodal communication. He requires max-to-total A for repetition at word level and object naming. Severity of deficits continue to significantly impact communication of functional needs. Patient is currently tolerating Dys 2 diet and thin liquids and continues to exhibit oral phase deficits which affected appropriateness for diet advancement. Patient currently min-to-mod A from a cognitive standpoint, although difficult to assess due to severity of expressive/receptive language deficits. He requires close sup A for initiation and sequencing with all ADLs secondary to apraxia. Patient and family education is complete and patient will discharge home with 24 hour supervision from family. Patient would benefit from continued SLP services to maximize language, swallow, and cognitive-communication function, decrease caregiver burden, enhance functional independence, and quality of life.   Care Partner:  Caregiver Able to Provide Assistance: Yes  Type of Caregiver Assistance: Cognitive;Physical  Recommendation:  Home Health SLP;24 hour supervision/assistance  Rationale for SLP Follow Up: Maximize functional communication;Maximize cognitive function and independence;Reduce caregiver burden;Maximize swallowing safety   Equipment: NA   Reasons for discharge: Treatment goals met;Discharged from hospital   Patient/Family Agrees with Progress Made and Goals Achieved: Yes    Randalyn Ahmed T Destinie Thornsberry 12/10/2020, 3:55 PM

## 2020-12-09 NOTE — Progress Notes (Signed)
Physical Therapy Weekly Progress Note  Patient Details  Name: Jesse [name] MRN: [id number] Date of Birth: 1976/03/13  Beginning of progress report period: November 28, 2020 End of progress report period: December 08, 2020  Patient has met  7 of 9 short term goals.  Pt making appropriate progress towards goals and is on track to meet LTG. He completes bed mobility with independence, functional transfers with supervision, and is ambulating >360f with CGA/ supervision without use of AD. Has navigated a full flight of hospital stairs using one handrail and CGA. He is mainly limited by cognition, aphasia with expressive> receptive, and improving balance.    Patient continues to demonstrate the following deficits muscle weakness, decreased cardiorespiratoy endurance, ,, decreased visual perceptual skills, decreased problem solving and delayed processing, and decreased standing balance and decreased balance strategies and therefore will continue to benefit from skilled PT intervention to increase functional independence with mobility.  Patient progressing toward long term goals..  Continue plan of care.  PT Short Term Goals Week 1:  PT Short Term Goal 1 (Week 1): STG = LTG due to ELOS  Skilled Therapeutic Interventions/Progress Updates:  Ambulation/gait training;Discharge planning;Functional mobility training;Psychosocial support;Therapeutic Activities;Visual/perceptual remediation/compensation;Balance/vestibular training;Disease management/prevention;Neuromuscular re-education;Skin care/wound management;Therapeutic Exercise;Wheelchair propulsion/positioning;Cognitive remediation/compensation;DME/adaptive equipment instruction;Pain management;Splinting/orthotics;UE/LE Strength taining/ROM;Community reintegration;Functional electrical stimulation;Patient/family education;Stair training;UE/LE Coordination activities    Therapy Documentation Precautions:  Precautions Precautions: Sternal,  Fall Precaution Comments: global aphasia, DYS 2 diet, full supervision with thin liquids Restrictions Weight Bearing Restrictions: No RUE Weight Bearing: Weight bearing as tolerated LUE Weight Bearing: Weight bearing as tolerated Other Position/Activity Restrictions: sternal  Therapy/Group: Individual Therapy  Jesse SimonsPT, DPT 12/08/2020, 4:14 PM

## 2020-12-09 NOTE — Progress Notes (Signed)
PROGRESS NOTE   Subjective/Complaints:    ROS: Limited due to cognition.  Objective:   No results found. Recent Labs    12/08/20 0715  WBC 5.3  HGB 11.0*  HCT 34.9*  PLT 243     Recent Labs    12/07/20 0658 12/08/20 0715  NA 135 137  K 4.1 4.3  CL 101 103  CO2 26 25  GLUCOSE 98 97  BUN 18 18  CREATININE 1.26* 1.16  CALCIUM 8.9 8.9      Intake/Output Summary (Last 24 hours) at 12/09/2020 0848 Last data filed at 12/09/2020 2831 Gross per 24 hour  Intake 1560 ml  Output 2900 ml  Net -1340 ml      Pressure Injury 11/14/20 Vertebral column Stage 1 -  Intact skin with non-blanchable redness of a localized area usually over a bony prominence. 3x4 area w/ scab on outside and pink center on bony prominince (Active)  11/14/20 0800  Location: Vertebral column  Location Orientation:   Staging: Stage 1 -  Intact skin with non-blanchable redness of a localized area usually over a bony prominence.  Wound Description (Comments): 3x4 area w/ scab on outside and pink center on bony prominince  Present on Admission: Yes    Physical Exam: Vital Signs Blood pressure 123/83, pulse 79, temperature 97.9 F (36.6 C), temperature source Oral, resp. rate 17, height 6\' 2"  (1.88 m), weight 60 kg, SpO2 100 %.   General: No acute distress Mood and affect are appropriate Heart: Regular rate and rhythm no rubs murmurs or extra sounds Lungs: Clear to auscultation, breathing unlabored, no rales or wheezes Abdomen: Positive bowel sounds, soft nontender to palpation, nondistended Extremities: No clubbing, cyanosis, or edema Skin: No evidence of breakdown, no evidence of rash Neurologic: aphasic and apraxic, fluent ,  motor strength is 5/5 in bilateral deltoid, bicep, tricep, grip, hip flexor, knee extensors, ankle dorsiflexor and plantar flexor Sensory exam normal sensation to light touch and proprioception in bilateral upper and  lower extremities Cerebellar exam normal finger to nose to finger as well as heel to shin in bilateral upper and lower extremities Musculoskeletal: Full range of motion in all 4 extremities. No joint swelling    Assessment/Plan: 1. Functional deficits which require 3+ hours per day of interdisciplinary therapy in a comprehensive inpatient rehab setting. Physiatrist is providing close team supervision and 24 hour management of active medical problems listed below. Physiatrist and rehab team continue to assess barriers to discharge/monitor patient progress toward functional and medical goals  Care Tool:  Bathing    Body parts bathed by patient: Face, Right arm, Left arm, Chest, Abdomen   Body parts bathed by helper: Front perineal area, Buttocks     Bathing assist Assist Level: Supervision/Verbal cueing     Upper Body Dressing/Undressing Upper body dressing   What is the patient wearing?: Pull over shirt    Upper body assist Assist Level: Minimal Assistance - Patient > 75%    Lower Body Dressing/Undressing Lower body dressing      What is the patient wearing?: Pants     Lower body assist Assist for lower body dressing: Supervision/Verbal cueing     Toileting  Toileting Toileting Activity did not occur Press photographer and hygiene only): N/A (no void or bm)  Toileting assist Assist for toileting: Maximal Assistance - Patient 25 - 49%     Transfers Chair/bed transfer  Transfers assist     Chair/bed transfer assist level: Minimal Assistance - Patient > 75%     Locomotion Ambulation   Ambulation assist      Assist level: Minimal Assistance - Patient > 75% Assistive device: Hand held assist Max distance: 429   Walk 10 feet activity   Assist     Assist level: Minimal Assistance - Patient > 75% Assistive device: Hand held assist   Walk 50 feet activity   Assist    Assist level: Minimal Assistance - Patient > 75% Assistive device: Hand held  assist    Walk 150 feet activity   Assist    Assist level: Minimal Assistance - Patient > 75% Assistive device: Hand held assist    Walk 10 feet on uneven surface  activity   Assist Walk 10 feet on uneven surfaces activity did not occur: Safety/medical concerns         Wheelchair     Assist Will patient use wheelchair at discharge?: No   Wheelchair activity did not occur: N/A         Wheelchair 50 feet with 2 turns activity    Assist    Wheelchair 50 feet with 2 turns activity did not occur: N/A       Wheelchair 150 feet activity     Assist  Wheelchair 150 feet activity did not occur: N/A       Blood pressure 123/83, pulse 79, temperature 97.9 F (36.6 C), temperature source Oral, resp. rate 17, height 6\' 2"  (1.88 m), weight 60 kg, SpO2 100 %.    Medical Problem List and Plan: 1.  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  Antithrombotics: -DVT/anticoagulation:  Pharmaceutical: Lovenox             -antiplatelet therapy: ASA 3. Pain Management:  Oxycodone 4. Mood: LCSW to follow for evaluation and support.             -antipsychotic agents: N/A 5. Neuropsych: This patient is not capable of making decisions on his own behalf. 6. Skin/Wound Care:  Routine pressure-relief measures. -- Monitor wound for healing. 7. Fluids/Electrolytes/Nutrition: Monitor I's/O. 8.  COVID PNA: Completed course of baricitinib  9.  HTN: Monitor blood pressures 3 times daily.  Continue Coreg and hydralazine. Vitals:   12/08/20 1944 12/09/20 0530  BP: 122/76 123/83  Pulse: 79 79  Resp: 18 17  Temp: 97.6 F (36.4 C) 97.9 F (36.6 C)  SpO2:  100%    Controlled on 8/16 10.  Post stroke dysphagia: Tolerating dysphagia 2, thin liquids without aspiration. 11.  Anxiety/agitation: Will continue ativan prn. D/c  haldol--last used 07/31. Continue SSI while on tube feeds.   -appears much improved.  12.  Urinary retention no obvious meds intake -plan to insert foley prior to d/c and have voiding trial at [facility]  13. Acute blood loss anemia  Hemoglobin 10.5 on 8/8, labs ordered for tomorrow 14.  Hyponatremia  Resolved Na+ 137 15.  AKI  Resolved Creat normal today   LOS: 12 days A FACE TO FACE EVALUATION WAS PERFORMED  10/8 Jesse [name]  12/09/2020, 8:48 AM

## 2020-12-10 ENCOUNTER — Other Ambulatory Visit (HOSPITAL_COMMUNITY): Payer: Self-pay

## 2020-12-10 LAB — GLUCOSE, CAPILLARY
Glucose-Capillary: 99 mg/dL (ref 70–99)
Glucose-Capillary: 94 mg/dL (ref 70–99)
Glucose-Capillary: 112 mg/dL — ABNORMAL HIGH (ref 70–99)
Glucose-Capillary: 116 mg/dL — ABNORMAL HIGH (ref 70–99)

## 2020-12-10 MED ORDER — AMLODIPINE BESYLATE 10 MG PO TABS
10.0000 mg | ORAL_TABLET | Freq: Every day | ORAL | 0 refills | Status: DC
  Filled 2020-12-10: qty 30, 30d supply, fill #0

## 2020-12-10 MED ORDER — QUETIAPINE FUMARATE 50 MG PO TABS
50.0000 mg | ORAL_TABLET | Freq: Every day | ORAL | 0 refills | Status: DC
  Filled 2020-12-10: qty 30, 30d supply, fill #0

## 2020-12-10 MED ORDER — HYDRALAZINE HCL 50 MG PO TABS
50.0000 mg | ORAL_TABLET | Freq: Three times a day (TID) | ORAL | 0 refills | Status: DC
  Filled 2020-12-10: qty 90, 30d supply, fill #0

## 2020-12-10 MED ORDER — ZINC SULFATE 220 (50 ZN) MG PO TABS
220.0000 mg | ORAL_TABLET | Freq: Every day | ORAL | 0 refills | Status: DC
Start: 2020-12-10 — End: 2021-01-12
  Filled 2020-12-10: qty 30, 30d supply, fill #0

## 2020-12-10 MED ORDER — TAMSULOSIN HCL 0.4 MG PO CAPS
0.8000 mg | ORAL_CAPSULE | Freq: Every day | ORAL | 0 refills | Status: DC
  Filled 2020-12-10: qty 60, 30d supply, fill #0

## 2020-12-10 MED ORDER — ASCORBIC ACID 500 MG PO TABS
500.0000 mg | ORAL_TABLET | Freq: Every day | ORAL | 0 refills | Status: DC
  Filled 2020-12-10: qty 30, 30d supply, fill #0

## 2020-12-10 MED ORDER — CHLORHEXIDINE GLUCONATE CLOTH 2 % EX PADS
6.0000 | MEDICATED_PAD | Freq: Every day | CUTANEOUS | Status: DC
  Administered 2020-12-10 – 2020-12-11 (×2): 6 via TOPICAL

## 2020-12-10 MED ORDER — CARVEDILOL 3.125 MG PO TABS
3.1250 mg | ORAL_TABLET | Freq: Two times a day (BID) | ORAL | 0 refills | Status: DC
  Filled 2020-12-10: qty 60, 30d supply, fill #0

## 2020-12-10 MED ORDER — ROSUVASTATIN CALCIUM 20 MG PO TABS
20.0000 mg | ORAL_TABLET | Freq: Every day | ORAL | 11 refills | Status: DC
  Filled 2020-12-10: qty 30, 30d supply, fill #0

## 2020-12-10 MED ORDER — OXYCODONE HCL 5 MG/5ML PO SOLN
5.0000 mg | ORAL | 0 refills | Status: DC | PRN
  Filled 2020-12-10: qty 300, 5d supply, fill #0
  Filled 2020-12-29: qty 300, 5d supply, fill #1
  Filled 2020-12-30 (×2): qty 173, 3d supply, fill #1
  Filled 2020-12-30: qty 300, 5d supply, fill #1

## 2020-12-10 MED ORDER — TRAZODONE HCL 50 MG PO TABS
25.0000 mg | ORAL_TABLET | Freq: Every evening | ORAL | 0 refills | Status: DC | PRN
  Filled 2020-12-10: qty 30, 30d supply, fill #0

## 2020-12-10 MED ORDER — AMLODIPINE BESYLATE 10 MG PO TABS
10.0000 mg | ORAL_TABLET | Freq: Every day | ORAL | 0 refills | Status: DC
Start: 2020-12-10 — End: 2020-12-10
  Filled 2020-12-10: qty 30, 30d supply, fill #0

## 2020-12-10 MED ORDER — PANTOPRAZOLE SODIUM 40 MG PO PACK
40.0000 mg | PACK | Freq: Every day | ORAL | 0 refills | Status: DC
  Filled 2020-12-10: qty 20, 30d supply, fill #0
  Filled 2020-12-29: qty 20, 30d supply, fill #1

## 2020-12-10 MED ORDER — FERROUS SULFATE 325 (65 FE) MG PO TABS
325.0000 mg | ORAL_TABLET | Freq: Every day | ORAL | 3 refills | Status: DC
  Filled 2020-12-10: qty 30, 30d supply, fill #0

## 2020-12-10 NOTE — Progress Notes (Signed)
Physical Therapy Discharge Summary  Patient Details  Name: Jesse [name] MRN: [id number] Date of Birth: [birth year]  Today's Date: 12/10/2020 PT Individual Time: 3762-8315, 1761-6073 PT Individual Time Calculation (min): 74 min, 46 min    Patient has met 9 of 9 long term goals due to improved activity tolerance, improved balance, improved postural control, increased strength, ability to compensate for deficits, and improved awareness.  Patient to discharge at an ambulatory level Supervision.   Patient's care partner is independent to provide the necessary physical and cognitive assistance at discharge.  Reasons goals not met: N/A. Pt has met all goals.   Recommendation:  Patient will benefit from ongoing skilled PT services in outpatient setting to continue to advance safe functional mobility, address ongoing impairments in dynamic balance, higher level balance tasks, strength, cognition, and minimize fall risk.   Equipment: No equipment provided  Reasons for discharge: treatment goals met and discharge from hospital  Patient/family agrees with progress made and goals achieved: Yes, family in agreement during previous family education session. Pt unable to agree to progress 2/2 global aphasia and family not present during discharge assessment this session.   Skill therapy Session one:  Pt received in bed with shoes on and no expressions against physical therapy throughout session.  Patient was instructed through discharge assessment. See below for results. No further questions at this time.   Gait training throughout session with mulitple longer bouts from room > outside no AD and close supervision, greatest outside distance >582f. Gait training outside on sloped uneven/surface >2522fx2 Close supervision. Included 4 6" steps x2 and 1 handrail. Pt demonstrated good balance with uneven/sloped surface with no LOB and no scissoring gait.   Environmental scanning with cone retrieving x6  cones x 3 bouts close supervision-occasional CGA with uneven surfaces using outside courtyard. Cones placed at various heights, distances, and visibility levels. 2 instances of pt requiring generalized direction cues for hidden cones.     Weaving through pillars x~40 ft x6 close supervision. Multimodal cuing for technique/instructions. Intermittent stumble step with pt self correcting. Pt improved gait velocity and mobility with weaving as activity progressed.   Forward/backwards walking 4011f 4 supervision - CGA for backwards. Lateral walking 24f73f with CGA. Pt required increased mutlimodal cuing for lateral walking and manual facilitation to prevent hips from turning and walking forward. Improved in technique as activity progressed.   BITs on airex pad and close supervision for visual scanning and cognition. Pt completed #1-16 100% accuracy. ABC's completed through G consistently and accurately. Pt required mutlimodal cuing for following letters.   1 flight of stairs completed with close supervision and light single UE support on  handrail (appeared to be more of habit then assistance). Car transfer with supervision assist.   Pt completed 6 minute walk test with close supervision no AD- intermittent CGA during turning during end of walk test. 3 min distance = 558.7ft 52fal was 429ft 59fhand held assist) and 6 minute distance = 1151ft. 3fs demonstrates improvement in endurance and overall balance/safety during gait.   Pt returned to be with bed alarm on and call bell within reach. All needs met at this time.   Session Two:  Pt received in bed with shoes on and no expressions against physical therapy throughout session.   Gait training >150ft x258fAD close supervision. No instances of scissoring or LOB this session.   Peg board with colored pegs on airex pad close supervision. Pt demonstrated ability to correctly identify  instructed color peg as well as organize the color to each row >85% of  the time for all pegs (blue, green, orange, red, yellow, black). A few instances of verbal cuing by repeating color for patient to be able to correct. Pt then demonstrated the ability to correctly follow instructions of the color peg and # of pegs to remove from the board 100% of the time.   Pt completed cleaning pegs while on blue wedge close supervision-intermittent CGA for steadying 2/2 posterior lean. With multimodal cuing and mimicking technique, pt able to demonstrate ability to clean all colored pegs.   Bean bag on airex pad 12 bean bags x 1 round RUE, x1 round LUE supervision. Pt instructed on color bean bag to toss, correctly identifying color 85% of time (same cuing as above). Noted improved accuracy with tossing LUE (all in the whole but 2 compared to all in but 4 RUE). Pt retrieved bean bags off the floor with close supervision. Without instructing, pt started organizing based on color while picking up with 100% accuracy.  Dynamic standing balance hitting beach ball with BUE on 1# bar backtowards +2, close supervision x30. Multimodal cuing for instructions. Pt required encouragement towards end of activity 2/2 frustration with not hitting ball to target accurately.   Lateral step ups 6" step CGA for steadying x10 each LE. Verbal/visual cuing for instructions and technique. Pt's understanding of instructions is much improved from previous attempts in prior sessions allowing him to safely complete task.   Pt returned to be with bed alarm on and call bell within reach. All needs met at this time.   PT Discharge Precautions/Restrictions Precautions Precautions: Sternal;Fall Precaution Booklet Issued: No Precaution Comments: global aphasia Restrictions RUE Weight Bearing: Weight bearing as tolerated LUE Weight Bearing: Weight bearing as tolerated Other Position/Activity Restrictions: sternal Pain Pain Assessment Pain Scale: 0-10 Pain Score: 0-No pain Faces Pain Scale: No  hurt Vision/Perception  Vision - Assessment Additional Comments: Difficulty assessing 2/2 global aphasia. Occassionally states, "i can't see" and noted to peer over top of glassess when working with near view activities. Pt has improved his ability to compensate for possible field cut noted on eval with scanning towards the left visual field Perception Perception: Impaired  Cognition Overall Cognitive Status: Impaired/Different from baseline Arousal/Alertness: Awake/alert Safety/Judgment: Impaired Comments: difficulty with assessing cognition 2/2 global aphasia Sensation Sensation Light Touch: Appears Intact Coordination Gross Motor Movements are Fluid and Coordinated: No Coordination and Movement Description: Occasional slight scissoring with turns, pt able to correct balance Heel Shin Test: pt unable to follow commands for assessment Motor  Motor Motor: Motor apraxia Motor - Discharge Observations: occasional scissoring with turns, overall dynamic balance improved from eval.  Mobility Bed Mobility Bed Mobility: Supine to Sit;Sit to Supine Rolling Left: Supervision/Verbal cueing Left Sidelying to Sit: Independent with assistive device Supine to Sit: Independent Sit to Supine: Independent Transfers Transfers: Sit to Stand;Stand to Sit;Stand Pivot Transfers Sit to Stand: Supervision/Verbal cueing Stand to Sit: Supervision/Verbal cueing Stand Pivot Transfers: Supervision/Verbal cueing Transfer (Assistive device): None Locomotion  Gait Ambulation: Yes Gait Assistance: Contact Guard/Touching assist (Supervision mostly, CGA towards the end of the distance due to scissoring. Pt able to recover balance) Gait Distance (Feet): 1157 Feet Assistive device: None  Trunk/Postural Assessment  Cervical Assessment Cervical Assessment: Within Functional Limits Thoracic Assessment Thoracic Assessment: Within Functional Limits Lumbar Assessment Lumbar Assessment: Within Functional  Limits Postural Control Postural Control: Deficits on evaluation Protective Responses: delayed  Balance Balance Balance Assessed: Yes Static Sitting  Balance Static Sitting - Balance Support: No upper extremity supported;Feet supported Static Sitting - Level of Assistance: 7: Independent Static Standing Balance Static Standing - Balance Support: No upper extremity supported;During functional activity Static Standing - Level of Assistance: 5: Stand by assistance Dynamic Standing Balance Dynamic Standing - Balance Support: During functional activity;No upper extremity supported Dynamic Standing - Level of Assistance: 5: Stand by assistance Extremity Assessment  RLE Assessment RLE Assessment: Within Functional Limits General Strength Comments: Unable to formally MMT 2/2 global aphasia LLE Assessment LLE Assessment: Within Functional Limits General Strength Comments: Unable to formally MMT 2/2 global aphasia.   Shaquera Ansley, SPT  12/10/2020, 1:01 PM

## 2020-12-10 NOTE — Plan of Care (Signed)
  Problem: RH Dressing Goal: LTG Patient will perform upper body dressing (OT) Description: LTG Patient will perform upper body dressing with assist, with/without cues (OT). Outcome: Not Met (add Reason) Flowsheets (Taken 12/10/2020 1219) LTG: Pt will perform upper body dressing with assistance level of: (Pt cont to be limited by apraxia impairing sequencing of task, cont to req min cuing at times to don/doff shirt.) -- Note: Pt cont to be limited by apraxia impairing sequencing of task, cont to req min cuing at times to don/doff shirt.   Problem: RH Toileting Goal: LTG Patient will perform toileting task (3/3 steps) with assistance level (OT) Description: LTG: Patient will perform toileting task (3/3 steps) with assistance level (OT)  Outcome: Not Met (add Reason) Flowsheets (Taken 12/10/2020 1219) LTG: Pt will perform toileting task (3/3 steps) with assistance level: (Pt cont to be limited by apraxia impairing thoroughouness of pericare. Poor awareness of incontinence.) -- Note: Pt cont to be limited by apraxia impairing thoroughouness of pericare. Poor awareness of incontinence.    Problem: RH Balance Goal: LTG Patient will maintain dynamic standing with ADLs (OT) Description: LTG:  Patient will maintain dynamic standing balance with assist during activities of daily living (OT)  Outcome: Completed/Met   Problem: Sit to Stand Goal: LTG:  Patient will perform sit to stand in prep for activites of daily living with assistance level (OT) Description: LTG:  Patient will perform sit to stand in prep for activites of daily living with assistance level (OT) Outcome: Completed/Met   Problem: RH Eating Goal: LTG Patient will perform eating w/assist, cues/equip (OT) Description: LTG: Patient will perform eating with assist, with/without cues using equipment (OT) Outcome: Completed/Met   Problem: RH Grooming Goal: LTG Patient will perform grooming w/assist,cues/equip (OT) Description: LTG:  Patient will perform grooming with assist, with/without cues using equipment (OT) Outcome: Completed/Met   Problem: RH Bathing Goal: LTG Patient will bathe all body parts with assist levels (OT) Description: LTG: Patient will bathe all body parts with assist levels (OT) Outcome: Completed/Met   Problem: RH Dressing Goal: LTG Patient will perform lower body dressing w/assist (OT) Description: LTG: Patient will perform lower body dressing with assist, with/without cues in positioning using equipment (OT) Outcome: Completed/Met   Problem: RH Toilet Transfers Goal: LTG Patient will perform toilet transfers w/assist (OT) Description: LTG: Patient will perform toilet transfers with assist, with/without cues using equipment (OT) Outcome: Completed/Met   Problem: RH Tub/Shower Transfers Goal: LTG Patient will perform tub/shower transfers w/assist (OT) Description: LTG: Patient will perform tub/shower transfers with assist, with/without cues using equipment (OT) Outcome: Completed/Met

## 2020-12-10 NOTE — Progress Notes (Signed)
PROGRESS NOTE   Subjective/Complaints: Spoke with PA, preauth needed for Seroquel.  Patient no longer having agitation at night. Seen in physical therapy today.   ROS: Limited due to cognition.  Objective:   No results found. Recent Labs    12/08/20 0715  WBC 5.3  HGB 11.0*  HCT 34.9*  PLT 243     Recent Labs    12/08/20 0715  NA 137  K 4.3  CL 103  CO2 25  GLUCOSE 97  BUN 18  CREATININE 1.16  CALCIUM 8.9      Intake/Output Summary (Last 24 hours) at 12/10/2020 0859 Last data filed at 12/10/2020 2956 Gross per 24 hour  Intake 960 ml  Output 850 ml  Net 110 ml      Pressure Injury 11/14/20 Vertebral column Stage 1 -  Intact skin with non-blanchable redness of a localized area usually over a bony prominence. 3x4 area w/ scab on outside and pink center on bony prominince (Active)  11/14/20 0800  Location: Vertebral column  Location Orientation:   Staging: Stage 1 -  Intact skin with non-blanchable redness of a localized area usually over a bony prominence.  Wound Description (Comments): 3x4 area w/ scab on outside and pink center on bony prominince  Present on Admission: Yes    Physical Exam: Vital Signs Blood pressure 116/74, pulse 65, temperature 98 F (36.7 C), temperature source Oral, resp. rate 18, height 6\' 2"  (1.88 m), weight 61.4 kg, SpO2 100 %.    General: No acute distress Mood and affect are appropriate Heart: Regular rate and rhythm no rubs murmurs or extra sounds Lungs: Clear to auscultation, breathing unlabored, no rales or wheezes Abdomen: Positive bowel sounds, soft nontender to palpation, nondistended Extremities: No clubbing, cyanosis, or edema Skin: No evidence of breakdown, no evidence of rash Neurologic: speech with fluent aphasia, + motor apraxia  motor strength is 5/5 in bilateral deltoid, bicep, tricep, grip, hip flexor, knee extensors, ankle dorsiflexor and plantar  flexor Sensory exam normal sensation to light touch and proprioception in bilateral upper and lower extremities Cerebellar exam normal finger to nose to finger as well as heel to shin in bilateral upper and lower extremities Musculoskeletal: Full range of motion in all 4 extremities. No joint swelling     Assessment/Plan: 1. Functional deficits which require 3+ hours per day of interdisciplinary therapy in a comprehensive inpatient rehab setting. Physiatrist is providing close team supervision and 24 hour management of active medical problems listed below. Physiatrist and rehab team continue to assess barriers to discharge/monitor patient progress toward functional and medical goals  Care Tool:  Bathing    Body parts bathed by patient: Face, Right arm, Left arm, Chest, Abdomen   Body parts bathed by helper: Front perineal area, Buttocks     Bathing assist Assist Level: Supervision/Verbal cueing     Upper Body Dressing/Undressing Upper body dressing   What is the patient wearing?: Pull over shirt    Upper body assist Assist Level: Minimal Assistance - Patient > 75%    Lower Body Dressing/Undressing Lower body dressing      What is the patient wearing?: Pants     Lower body  assist Assist for lower body dressing: Supervision/Verbal cueing     Toileting Toileting Toileting Activity did not occur (Clothing management and hygiene only): N/A (no void or bm)  Toileting assist Assist for toileting: Maximal Assistance - Patient 25 - 49%     Transfers Chair/bed transfer  Transfers assist     Chair/bed transfer assist level: Minimal Assistance - Patient > 75%     Locomotion Ambulation   Ambulation assist      Assist level: Contact Guard/Touching assist Assistive device: No Device Max distance: 300   Walk 10 feet activity   Assist     Assist level: Contact Guard/Touching assist Assistive device: No Device   Walk 50 feet activity   Assist    Assist  level: Contact Guard/Touching assist Assistive device: No Device    Walk 150 feet activity   Assist    Assist level: Contact Guard/Touching assist Assistive device: No Device    Walk 10 feet on uneven surface  activity   Assist Walk 10 feet on uneven surfaces activity did not occur: Safety/medical concerns   Assist level: Contact Guard/Touching assist Assistive device: Other (comment) (reaches for rail.)   Wheelchair     Assist Will patient use wheelchair at discharge?: No   Wheelchair activity did not occur: N/A         Wheelchair 50 feet with 2 turns activity    Assist    Wheelchair 50 feet with 2 turns activity did not occur: N/A       Wheelchair 150 feet activity     Assist  Wheelchair 150 feet activity did not occur: N/A       Blood pressure 116/74, pulse 65, temperature 98 F (36.7 C), temperature source Oral, resp. rate 18, height 6\' 2"  (1.88 m), weight 61.4 kg, SpO2 100 %.    Medical Problem List and Plan: 1.  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 Cont CIR PT, OT, SLP 2.  Antithrombotics: -DVT/anticoagulation:  Pharmaceutical: Lovenox             -antiplatelet therapy: ASA 3. Pain Management:  Oxycodone 4. Mood: LCSW to follow for evaluation and support.             -antipsychotic agents: N/A 5. Neuropsych: This patient is not capable of making decisions on his own behalf. 6. Skin/Wound Care:  Routine pressure-relief measures. -- Monitor wound for healing. 7. Fluids/Electrolytes/Nutrition: Monitor I's/O. 8.  COVID PNA: Completed course of baricitinib  9.  HTN: Monitor blood pressures 3 times daily.  Continue Coreg and hydralazine. Vitals:   12/09/20 1936 12/10/20 0401  BP: (!) 119/92 116/74  Pulse: 85 65  Resp: 18 18  Temp: 97.6 F (36.4 C) 98 F (36.7 C)  SpO2:  100% 100%    Controlled on 8/ 17 10.  Post stroke dysphagia: Tolerating dysphagia 2, thin liquids without aspiration. 11.  Anxiety/agitation: Resolved, discontinue Seroquel 12.  Urinary retention no obvious meds intake -plan to insert foley prior to d/c and have voiding trial at [facility]  13. Acute blood loss anemia  Hemoglobin 10.5 on 8/8, labs ordered for tomorrow 14.  Hyponatremia  Resolved Na+ 137 15.  AKI  Resolved Creat normal today   LOS: 13 days A FACE TO FACE EVALUATION WAS PERFORMED  10/8 12/10/2020, 8:59 AM

## 2020-12-10 NOTE — Plan of Care (Signed)
  Problem: RH Swallowing Goal: LTG Patient will consume least restrictive diet using compensatory strategies with assistance (SLP) Description: LTG:  Patient will consume least restrictive diet using compensatory strategies with assistance (SLP) Outcome: Completed/Met Goal: LTG Patient will participate in dysphagia therapy to increase swallow function with assistance (SLP) Description: LTG:  Patient will participate in dysphagia therapy to increase swallow function with assistance (SLP) Outcome: Completed/Met Goal: LTG Pt will demonstrate functional change in swallow as evidenced by bedside/clinical objective assessment (SLP) Description: LTG: Patient will demonstrate functional change in swallow as evidenced by bedside/clinical objective assessment (SLP) Outcome: Adequate for Discharge   Problem: RH Comprehension Communication Goal: LTG Patient will comprehend basic/complex auditory (SLP) Description: LTG: Patient will comprehend basic/complex auditory information with cues (SLP). Outcome: Completed/Met   Problem: RH Expression Communication Goal: LTG Patient will express needs/wants via multi-modal(SLP) Description: LTG:  Patient will express needs/wants via multi-modal communication (gestures/written, etc) with cues (SLP) Outcome: Completed/Met   Problem: RH Problem Solving Goal: LTG Patient will demonstrate problem solving for (SLP) Description: LTG:  Patient will demonstrate problem solving for basic/complex daily situations with cues  (SLP) Outcome: Completed/Met   Problem: RH Attention Goal: LTG Patient will demonstrate this level of attention during functional activites (SLP) Description: LTG:  Patient will will demonstrate this level of attention during functional activites (SLP) Outcome: Completed/Met

## 2020-12-10 NOTE — Progress Notes (Signed)
Patient ID: Jesse [name], male   DOB: 1975/09/16, 45 y.o.   MRN: [id number]  SW covering for primary SW [employer].  Pt was approved for charity HHPT/OT/SLP with [facility] ([facility]).   SW spoke with pt wife [name] [phone]) to inform on above about charity HH. She stated she spoke with someone from the agency already. SW encouraged her to still schedule outpatient therapies, and to discuss financial assistance. SW informed on new patient appointment scheduled that will be in AVS, and encouraged her to discuss financial assistance and orange card. SW confirmed with her DME was delivered, and to ensure she completed financial assistance application and send back into vendor. No questions/concerns reported.   Cecile Sheerer, MSW, LCSWA Office: [phone] Cell: [phone] Fax: [phone]

## 2020-12-10 NOTE — Patient Care Conference (Signed)
Inpatient RehabilitationTeam Conference and Plan of Care Update Date: 12/10/2020   Time: 10:16 AM    Patient Name: Jesse [name]      Medical Record Number: [id number]  Date of Birth: 02-Jul-1975 Sex: Male         Room/Bed: 5H29J/2E26S-34 Payor Info: Payor: /    Admit Date/Time:  11/27/2020  4:24 PM  Primary Diagnosis:  Acute ischemic right MCA stroke [facility])  Hospital Problems: Principal Problem:   Acute ischemic right MCA stroke (HCC) Active Problems:   Hyponatremia   Acute blood loss anemia   Urinary retention   AKI (acute kidney injury) (HCC)   Benign essential HTN    Expected Discharge Date: Expected Discharge Date: 12/11/20  Team Members Present: Physician leading conference: Dr. Claudette Laws Social Worker Present: Cecile Sheerer, LCSWA Nurse Present: Chana Bode, RN PT Present: Grier Rocher, PT OT Present: Annye English, OT SLP Present: Eilene Ghazi, SLP PPS Coordinator present : Fae Pippin, SLP     Current Status/Progress Goal Weekly Team Focus  Bowel/Bladder             Swallow/Nutrition/ Hydration   Dys 2, thin. Continues to exhibit decreased mastication effectiveness. Sup A  Sup A  Dys 3 trials as appropriate   ADL's   S for UBB, UBD, self-feeding, grooming, req set-up for toothbrush for oral care; S to min A at times for UBD/LBD 2/2 apraxia; mod to max for toileting tasks but frequently declines attempting to toilet; TTB transfer with CGA; overall had demonstrated improvement from needing max multimodal cuing to min cuing for most dressing/ ADL tasks  S ADL and transfers  self-care/balanace/transfer retraining, func cognition, pt/family/DME/AE education   Mobility             Communication   Mod A receptive, max A secondary to severe expressive/receptive aphasia and possible apraxia of speech. Slight improvements with spontaneous speech however continued to be severely affected by severity of expressive deficits  Mod A  Multimodal  communication, automatic speech, object naming   Safety/Cognition/ Behavioral Observations  mod A - difficult to assess cognition secondary to severity of language deficits  mod A for problem solving, min A for attention  sequencing and initiation with ADLs, safety awareness   Pain             Skin               Discharge Planning:  Patient discharging to mothers home (initally, until home construction is complete) 2 level home, 2 steps. (uninsured)   Team Discussion: Left PICA with fluent aphasia and motor apraxia/sequencing issues.   Patient on target to meet rehab goals: yes, currently requires cues for initiation. Needs max assist for expression. Note improved spontaneous speech but little change with structured speech and unable to name objects. Able to complete oral care with supervision and multimodal cues, requires min - mod verbal cues for lower body dressing. Able to manage over 500' with close supervision and navigate over uneven surfaces and up a flight of steps. Requires cues for safety and direction. Remains on a D2 thin liquid diet due to impaired mastication of food and supervision when eating.   *See Care Plan and progress notes for long and short-term goals.   Revisions to Treatment Plan:  Requires I+O caths with high volumes despite medication addition and toileting; plan insertion of foley and follow up with urology in two weeks post discharge.   Teaching Needs: Safety, medications, dietary modifications, foley care, etc.  Current Barriers to Discharge: Decreased caregiver support, Home enviroment access/layout, and Neurogenic bowel and bladder  Possible Resolutions to Barriers: Family education     Medical Summary Current Status: Urinary retention requiring cath, poor awareness of bowel hygiene, apraxia is major issue oral and motor  Barriers to Discharge: Other (comments);Neurogenic Bowel & Bladder   Possible Resolutions to Barriers/Weekly Focus: will need  foley placed prior to d/c , will d/c seroquel prior to d/c   Continued Need for Acute Rehabilitation Level of Care: The patient requires daily medical management by a physician with specialized training in physical medicine and rehabilitation for the following reasons: Direction of a multidisciplinary physical rehabilitation program to maximize functional independence : Yes Medical management of patient stability for increased activity during participation in an intensive rehabilitation regime.: Yes Analysis of laboratory values and/or radiology reports with any subsequent need for medication adjustment and/or medical intervention. : Yes   I attest that I was present, lead the team conference, and concur with the assessment and plan of the team.   Chana Bode B 12/10/2020, 12:53 PM

## 2020-12-10 NOTE — Progress Notes (Signed)
Coude 18fr foley placed, immediate urine return. Pt tolerated well, no c/o of pain, or irritation. Bag emptied with 500cc of urine return. Peri care, and foley care done. Education given to pt.   Rayburn Ma

## 2020-12-10 NOTE — Plan of Care (Signed)
  Problem: RH Balance Goal: LTG Patient will maintain dynamic sitting balance (PT) Description: LTG:  Patient will maintain dynamic sitting balance with assistance during mobility activities (PT) Outcome: Completed/Met Goal: LTG Patient will maintain dynamic standing balance (PT) Description: LTG:  Patient will maintain dynamic standing balance with assistance during mobility activities (PT) Outcome: Completed/Met   Problem: Sit to Stand Goal: LTG:  Patient will perform sit to stand with assistance level (PT) Description: LTG:  Patient will perform sit to stand with assistance level (PT) Outcome: Completed/Met   Problem: RH Bed Mobility Goal: LTG Patient will perform bed mobility with assist (PT) Description: LTG: Patient will perform bed mobility with assistance, with/without cues (PT). Outcome: Completed/Met   Problem: RH Bed to Chair Transfers Goal: LTG Patient will perform bed/chair transfers w/assist (PT) Description: LTG: Patient will perform bed to chair transfers with assistance (PT). Outcome: Completed/Met   Problem: RH Car Transfers Goal: LTG Patient will perform car transfers with assist (PT) Description: LTG: Patient will perform car transfers with assistance (PT). Outcome: Completed/Met   Problem: RH Ambulation Goal: LTG Patient will ambulate in controlled environment (PT) Description: LTG: Patient will ambulate in a controlled environment, # of feet with assistance (PT). Outcome: Completed/Met   Problem: RH Stairs Goal: LTG Patient will ambulate up and down stairs w/assist (PT) Description: LTG: Patient will ambulate up and down # of stairs with assistance (PT) Outcome: Completed/Met

## 2020-12-11 LAB — GLUCOSE, CAPILLARY: Glucose-Capillary: 113 mg/dL — ABNORMAL HIGH (ref 70–99)

## 2020-12-11 NOTE — Progress Notes (Signed)
PROGRESS NOTE   Subjective/Complaints: No agitation off seroquel    ROS: Limited due to cognition.  Objective:   No results found. No results for input(s): WBC, HGB, HCT, PLT in the last 72 hours.   No results for input(s): NA, K, CL, CO2, GLUCOSE, BUN, CREATININE, CALCIUM in the last 72 hours.    Intake/Output Summary (Last 24 hours) at 12/11/2020 0906 Last data filed at 12/11/2020 0800 Gross per 24 hour  Intake 480 ml  Output 4200 ml  Net -3720 ml      Pressure Injury 11/14/20 Vertebral column Stage 1 -  Intact skin with non-blanchable redness of a localized area usually over a bony prominence. 3x4 area w/ scab on outside and pink center on bony prominince (Active)  11/14/20 0800  Location: Vertebral column  Location Orientation:   Staging: Stage 1 -  Intact skin with non-blanchable redness of a localized area usually over a bony prominence.  Wound Description (Comments): 3x4 area w/ scab on outside and pink center on bony prominince  Present on Admission: Yes    Physical Exam: Vital Signs Blood pressure (!) 125/91, pulse 78, temperature 98.1 F (36.7 C), resp. rate 18, height 6\' 2"  (1.88 m), weight 60.6 kg, SpO2 100 %.    General: No acute distress Mood and affect are appropriate Heart: Regular rate and rhythm no rubs murmurs or extra sounds Lungs: Clear to auscultation, breathing unlabored, no rales or wheezes Abdomen: Positive bowel sounds, soft nontender to palpation, nondistended Extremities: No clubbing, cyanosis, or edema Skin: No evidence of breakdown, no evidence of rash   Neurologic: speech with fluent aphasia, + motor apraxia  motor strength is 5/5 in bilateral deltoid, bicep, tricep, grip, hip flexor, knee extensors, ankle dorsiflexor and plantar flexor Sensory exam normal sensation to light touch and proprioception in bilateral upper and lower extremities  Musculoskeletal: Full range of  motion in all 4 extremities. No joint swelling     Assessment/Plan: 1. Functional deficits  Stable for D/C today F/u PCP in 3-4 weeks F/u PM&R 2 weeks See D/C summary See D/C instructions  Care Tool:  Bathing    Body parts bathed by patient: Face, Right arm, Left arm, Chest, Abdomen   Body parts bathed by helper: Front perineal area, Buttocks     Bathing assist Assist Level: Supervision/Verbal cueing     Upper Body Dressing/Undressing Upper body dressing   What is the patient wearing?: Pull over shirt    Upper body assist Assist Level: Minimal Assistance - Patient > 75%    Lower Body Dressing/Undressing Lower body dressing      What is the patient wearing?: Pants     Lower body assist Assist for lower body dressing: Supervision/Verbal cueing     Toileting Toileting Toileting Activity did not occur (Clothing management and hygiene only): N/A (no void or bm)  Toileting assist Assist for toileting: Maximal Assistance - Patient 25 - 49%     Transfers Chair/bed transfer  Transfers assist     Chair/bed transfer assist level: Supervision/Verbal cueing     Locomotion Ambulation   Ambulation assist      Assist level: Contact Guard/Touching assist Assistive device: No Device  Max distance: >573ft   Walk 10 feet activity   Assist     Assist level: Supervision/Verbal cueing Assistive device: No Device   Walk 50 feet activity   Assist    Assist level: Supervision/Verbal cueing Assistive device: No Device    Walk 150 feet activity   Assist    Assist level: Supervision/Verbal cueing (close supervision, longer distances can be CGA with occasional LE cross over. Able to self correct balance) Assistive device: No Device    Walk 10 feet on uneven surface  activity   Assist Walk 10 feet on uneven surfaces activity did not occur: Safety/medical concerns   Assist level: Contact Guard/Touching assist Assistive device: Other (comment)  (reaches for rail.)   Wheelchair     Assist Will patient use wheelchair at discharge?: No   Wheelchair activity did not occur: N/A         Wheelchair 50 feet with 2 turns activity    Assist    Wheelchair 50 feet with 2 turns activity did not occur: N/A       Wheelchair 150 feet activity     Assist  Wheelchair 150 feet activity did not occur: N/A       Blood pressure (!) 125/91, pulse 78, temperature 98.1 F (36.7 C), resp. rate 18, height 6\' 2"  (1.88 m), weight 60.6 kg, SpO2 100 %.    Medical Problem List and Plan: 1.  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  Antithrombotics: -DVT/anticoagulation:  Pharmaceutical: Lovenox             -antiplatelet therapy: ASA 3. Pain Management:  Oxycodone 4. Mood: LCSW to follow for evaluation and support.             -antipsychotic agents: N/A 5. Neuropsych: This patient is not capable of making decisions on his own behalf. 6. Skin/Wound Care:  Routine pressure-relief measures. -- Monitor wound for healing. 7. Fluids/Electrolytes/Nutrition: Monitor I's/O. 8.  COVID PNA: Completed course of baricitinib  9.  HTN: Monitor blood pressures 3 times daily.  Continue Coreg and hydralazine. Vitals:   12/10/20 1950 12/11/20 0523  BP: 118/77 (!) 125/91  Pulse: 75 78  Resp: 18 18  Temp: 98.3 F (36.8 C) 98.1 F (36.7 C)  SpO2: 100% 100%    Controlled on 8/ 17 10.  Post stroke dysphagia: Tolerating dysphagia 2, thin liquids without aspiration. 11.  Anxiety/agitation: Resolved, discontinue Seroquel 12.  Urinary retention no obvious meds intake -plan to insert foley prior to d/c and have voiding trial at [facility]  13. Acute blood loss anemia  Hemoglobin 10.5 on 8/8, labs ordered for tomorrow 14.  Hyponatremia  Resolved Na+ 137 15.   AKI  Resolved Creat normal today   LOS: 14 days A FACE TO FACE EVALUATION WAS PERFORMED  10/8 12/11/2020, 9:06 AM

## 2020-12-11 NOTE — Progress Notes (Signed)
Patient ID: Jesse [name], male   DOB: Sep 03, 1975, 45 y.o.   MRN: [id number]  Pt set up for [employer] medication assistance program.  Cecile Sheerer, MSW, LCSWA Office: [phone] Cell: [phone] Fax: [phone]

## 2020-12-11 NOTE — Progress Notes (Signed)
Patient discharged home via wheelchair on a private transport. Foley care instructions given + patient and wife educated. All question answered. Belongings sent home. Provider explained discharge instructions to family member and patient. All admission goals met by discharge.

## 2020-12-11 NOTE — Plan of Care (Signed)
I+O caths for retention even w medication; foley for discharge with urology follow up in 2 weeks per MD

## 2020-12-13 NOTE — Discharge Summary (Signed)
Physician Discharge Summary  Patient ID: Jesse [name] MRN: [id number] DOB/AGE: 47/28/1977 45 y.o.  Admit date: 11/27/2020 Discharge date: 12/11/2020  Discharge Diagnoses:  Principal Problem:   Acute ischemic right MCA stroke [facility]) Active Problems:   Hyponatremia   Acute blood loss anemia   Urinary retention   AKI (acute kidney injury) (HCC)   Benign essential HTN   Discharged Condition: stable  Significant Diagnostic Studies:   Labs:  Basic Metabolic Panel: BMP Latest Ref Rng & Units 12/08/2020 12/07/2020 12/05/2020  Glucose 70 - 99 mg/dL 97 98 361(W)  BUN 6 - 20 mg/dL 18 18 43(X)  Creatinine 0.61 - 1.24 mg/dL 5.40 0.86(P) 6.19(J)  Sodium 135 - 145 mmol/L 137 135 129(L)  Potassium 3.5 - 5.1 mmol/L 4.3 4.1 3.9  Chloride 98 - 111 mmol/L 103 101 96(L)  CO2 22 - 32 mmol/L 25 26 24   Calcium 8.9 - 10.3 mg/dL 8.9 8.9 )     CBC: CBC Latest Ref Rng & Units 12/08/2020 12/01/2020 11/28/2020  WBC 4.0 - 10.5 K/uL 5.3 6.2 8.0  Hemoglobin 13.0 - 17.0 g/dL 11.0(L) 10.5(L) 11.1(L)  Hematocrit 39.0 - 52.0 % 34.9(L) 33.4(L) 33.8(L)  Platelets 150 - 400 K/uL 243 278 354     CBG: Recent Labs  Lab 12/10/20 0613 12/10/20 1120 12/10/20 1633 12/10/20 2058 12/11/20 0521  GLUCAP 99 94 112* 116* 113*    Brief HPI:   Jesse [name] is a 45 y.o. male with history of HTN, depression, anxiety originally admitted to [facility] on 11/04/2020 with aphasia, blurry vision right and mental status changes due to aortic dissection requiring emergent repair by Dr. 01/05/2021 on the same day.  Follow-up CT head showed relatively large infarct with cytotoxic edema in posterior right MCA and MCA/PCA watershed territories with development of petechial hemorrhage. He was started on low-dose ASA for stroke prophylaxis.  He was noted to have significant deficits in mobility as well as aphasia with apraxia and was transferred to CIR on 07/21 for intensive rehab program.  On 07/22 he was noted to be hypotensive and tachycardic  with agonal breathing requiring CODE BLUE and brief intubation with low-dose levo.    Arrest to be due to aspiration pneumonia as well as COVID-positive state and he was treated with antibiotics, remdesivir, steroids as well as baricitinib.  He tolerated extubation by 07/23 and repeat CT of the head showed evolving right MCA territory infarct with decreasing edema.  He was noted to have poor p.o. intake therefore core track was placed for nutritional support.  He continued to have issues with poor attention, expressive/receptive deficits with cognitive deficits, unsteady/staggering gait as well as difficulty completing ADLs.  CIR was recommended due to functional decline.   Hospital Course: Jesse [name] was admitted to rehab 11/27/2020 for inpatient therapies to consist of PT, ST and OT at least three hours five days a week. Past admission physiatrist, therapy team and rehab RN have worked together to provide customized collaborative inpatient rehab.  His p.o. intake was noted to be adequate therefore tube feeds and coretak were discontinued by 08/06.  He completed his course of baricitanib on 08/05 for treatment of COVID PNA. His respiratory status has been stable and he continues on dysphagia 2 diet for safety.  His blood pressures were monitored on TID basis and have been relatively controlled.   Follow-up check of CBC and he was acute blood loss anemia to be stable.  Serial check of electrolytes shows hyponatremia and renal status has improved and is  currently within normal limits.  Mood has been stable without bouts of agitation and he was weaned off Seroquel without difficulty.  He was noted to have issues with urinary retention requiring in and out catheterization despite addition of Flomax and Urecholine.  Foley was placed prior to discharge and he is to follow-up for voiding trial with urology in a couple of weeks.  He has made steady gains and currently requires supervision to min assist with  mobility and ADLs/cognitive tasks. He received on month supply of meds via [employer] and [facility] has agreed to provide  charity care  in regards to HHPT, HHOT and HHST after discharge.   Rehab course: During patient's stay in rehab weekly team conferences were held to monitor patient's progress, set goals and discuss barriers to discharge. At admission, patient required min assist with basic ADL task and with mobility. He exhibited severe mixed receptive-expressive aphasia with moderate to severe cognitive impairments and mild to moderate oropharyngeal dysphagia.  He has had improvement in activity tolerance, balance, postural control as well as ability to compensate for deficits. He requires supervision for transfers and to ambulate 1157 feet without AD. He does require CGA due to scissoring towards end of ambulation distance.  He is able to follow simple one-step direction, answer basic yes/no questions and is progressing toward expression of basic needs with multimodal cues.  He requires max to total assist for repetition at word level and min to mod assist for cognitive tasks due to severely of expressive/receptive language deficits.  He requires close supervision for initiation and sequencing of ADL tasks with apraxia.  Family education has been completed regarding all aspects of safety, care and communication.   Discharge disposition: 01-Home or Self Care  Diet: Dysphagia 2, heart healthy.   Special Instructions: Needs supervision for safety and with all cognitive tasks. 2.  Perform Foley care twice a day.   Discharge Instructions     Ambulatory referral to Neurology   Complete by: As directed    An appointment is requested in approximately: 4 weeks right MCA infarction   Ambulatory referral to Occupational Therapy   Complete by: As directed    Evaluate and treat   Ambulatory referral to Physical Medicine Rehab   Complete by: As directed    Moderate complexity follow-up 1 to 2  weeks right MCA infarction   Ambulatory referral to Physical Therapy   Complete by: As directed    Evaluate and treat   Ambulatory referral to Speech Therapy   Complete by: As directed    Evaluate and treat   Ambulatory referral to Urology   Complete by: As directed    Ambulatory referral for urinary retention with Foley tube placed      Allergies as of 12/11/2020   No Known Allergies      Medication List     STOP taking these medications    ascorbic acid 500 MG tablet Commonly known as: VITAMIN C   feeding supplement (OSMOLITE 1.5 CAL) Liqd   feeding supplement (PROSource TF) liquid   insulin aspart 100 UNIT/ML injection Commonly known as: novoLOG   melatonin 5 MG Tabs   zinc sulfate 220 (50 Zn) MG capsule Replaced by: Zinc Sulfate 220 (50 Zn) MG Tabs       TAKE these medications    acetaminophen 325 MG tablet Commonly known as: TYLENOL Take 1-2 tablets (325-650 mg total) by mouth every 4 (four) hours as needed for mild pain.   amLODipine 10  MG tablet Commonly known as: NORVASC Take 1 tablet (10 mg total) by mouth daily.   aspirin 81 MG chewable tablet Chew 1 tablet (81 mg total) by mouth daily.   carvedilol 3.125 MG tablet Commonly known as: COREG Take 1 tablet (3.125 mg total) by mouth 2 (two) times daily with a meal.   FeroSul 325 (65 FE) MG tablet Generic drug: ferrous sulfate Take 1 tablet (325 mg total) by mouth daily.   hydrALAZINE 50 MG tablet Commonly known as: APRESOLINE Take 1 tablet (50 mg total) by mouth every 8 (eight) hours.   oxyCODONE 5 MG/5ML solution--473 ml Commonly known as: ROXICODONE Take 5-10 mLs (5-10 mg total) by mouth every 4 (four) hours as needed for severe pain.   polyethylene glycol 17 g packet Commonly known as: MIRALAX / GLYCOLAX Take 17 g by mouth daily as needed for moderate constipation.   Protonix 40 mg Pack Generic drug: pantoprazole sodium Dissolve 1 packet in 20 mLs (40 mg total) and take by mouth  daily.   QUEtiapine 50 MG tablet Commonly known as: SEROQUEL Take 1 tablet (50 mg total) by mouth at bedtime.   rosuvastatin 20 MG tablet Commonly known as: Crestor Take 1 tablet (20 mg total) by mouth daily.   tamsulosin 0.4 MG Caps capsule Commonly known as: FLOMAX Take 2 capsules (0.8 mg total) by mouth daily after supper.   traZODone 50 MG tablet Commonly known as: DESYREL Take 0.5-1 tablets (25-50 mg total) by mouth at bedtime as needed for sleep.   Zinc Sulfate 220 (50 Zn) MG Tabs Take 1 tablet (220 mg total) by mouth daily. Replaces: zinc sulfate 220 (50 Zn) MG capsule        Follow-up Information     Kirsteins, Victorino Sparrow, MD Follow up.   Specialty: Physical Medicine and Rehabilitation Why: Office to call for appointment Contact information: [address] [place] [address] [phone]         Purcell Nails, MD Follow up.   Specialty: Cardiothoracic Surgery Why: Call for appointment                Signed: Jacquelynn Cree 12/13/2020, 9:30 PM

## 2020-12-16 ENCOUNTER — Telehealth: Payer: Self-pay

## 2020-12-16 NOTE — Telephone Encounter (Signed)
Chart reviewed per protocol: Verbal In-home Physical Therapy orders okay given: once a week for one week, twice a week for 4 weeks, then one a week for two weeks.  Also for an Skilled Child psychotherapist once a week for one week.  Order left on [facility] voice mail [phone] (office).

## 2020-12-17 ENCOUNTER — Telehealth: Payer: Self-pay

## 2020-12-17 NOTE — Telephone Encounter (Signed)
Consider antidepressant, day time seroquel

## 2020-12-17 NOTE — Telephone Encounter (Signed)
Okay given for 2 in-home speech therapy visits. To work on communication / dysphagia . Joan Flores with [facility] ph# [phone]).

## 2020-12-18 ENCOUNTER — Other Ambulatory Visit: Payer: Self-pay | Admitting: Surgery

## 2020-12-18 ENCOUNTER — Encounter: Payer: Self-pay | Admitting: Family Medicine

## 2020-12-18 DIAGNOSIS — I7101 Dissection of thoracic aorta: Secondary | ICD-10-CM

## 2020-12-18 DIAGNOSIS — I71019 Dissection of thoracic aorta, unspecified: Secondary | ICD-10-CM

## 2020-12-18 MED ORDER — QUETIAPINE FUMARATE 50 MG PO TABS: 50.0000 mg | ORAL_TABLET | Freq: Every day | ORAL | 0 refills | Status: DC

## 2020-12-18 NOTE — Telephone Encounter (Signed)
A new rx for seroquel was sent to pharmacy

## 2020-12-19 ENCOUNTER — Telehealth: Payer: Self-pay

## 2020-12-19 ENCOUNTER — Encounter: Payer: Self-pay | Admitting: Family Medicine

## 2020-12-19 ENCOUNTER — Other Ambulatory Visit: Payer: Self-pay

## 2020-12-19 NOTE — Telephone Encounter (Signed)
Quetiapine Fumarate 50 MG approved 12/19/2020-12/19/2021. Pharmacy notified & patient informed.

## 2020-12-22 ENCOUNTER — Other Ambulatory Visit: Payer: Self-pay

## 2020-12-22 ENCOUNTER — Ambulatory Visit
Admission: RE | Admit: 2020-12-22 | Discharge: 2020-12-22 | Disposition: A | Discharge status: Home / Self Care | Payer: Medicaid Other | Source: Ambulatory Visit | Attending: Surgery | Admitting: Surgery

## 2020-12-22 ENCOUNTER — Ambulatory Visit: Payer: Self-pay | Admitting: Family Medicine

## 2020-12-22 ENCOUNTER — Ambulatory Visit (INDEPENDENT_AMBULATORY_CARE_PROVIDER_SITE_OTHER): Payer: Self-pay | Admitting: Physician Assistant

## 2020-12-22 VITALS — BP 102/69 | HR 87 | Resp 20 | Ht 74.0 in | Wt 133.4 lb

## 2020-12-22 DIAGNOSIS — M79676 Pain in unspecified toe(s): Secondary | ICD-10-CM

## 2020-12-22 DIAGNOSIS — Z9889 Other specified postprocedural states: Secondary | ICD-10-CM | POA: Diagnosis not present

## 2020-12-22 DIAGNOSIS — I7100 Dissection of unspecified site of aorta: Secondary | ICD-10-CM | POA: Diagnosis not present

## 2020-12-22 DIAGNOSIS — I71019 Dissection of thoracic aorta, unspecified: Secondary | ICD-10-CM

## 2020-12-22 DIAGNOSIS — I7101 Dissection of thoracic aorta: Secondary | ICD-10-CM

## 2020-12-22 MED ORDER — AMLODIPINE BESYLATE 5 MG PO TABS: 5.0000 mg | ORAL_TABLET | Freq: Every day | ORAL | 1 refills | Status: DC

## 2020-12-22 MED ORDER — HYDRALAZINE HCL 25 MG PO TABS: 25.0000 mg | ORAL_TABLET | Freq: Three times a day (TID) | ORAL | 1 refills | Status: DC

## 2020-12-22 NOTE — Progress Notes (Signed)
[address]       [place] [address]             [phone]        Jesse [name] is a 45 y.o. male patient who underwent an emergent aortic dissection repair with Dr. Cornelius Moras on 11/05/2020. He had a complicated hospital course which involved getting neurology involved since he originally presented with aphasia and blurry vision. CT scan of the head was obtained that showed a large area of acute infarct in the territory of the middle cerebral artery and posterior cerebral arteries. On 7/15, pt had severe agitated delirium and worsening oxygen requirements, requiring heated high flow nasal cannula and a non-rebreather simultaneously. He was unable to sustain adequate oxygen saturation and he was re-intubated and sedated. On 7/16, patient was extubated to high flow nasal cannula.  On 7/19, pt found to have SVT in L antecubital fossa. Was started on keflex, vancomycin, and a heparin drip. ID was consulted. Blood cultures showed no growth, soft tissue ultrasound showed no abscess, so antibiotics were discontinued per ID recs. On 7/20 a rapid response was called for anisicoria. Head CT showed mild petechial hemorrhage with 1 extra mm of shift. No herniation. Neuro exam remained unchanged. Neurology was consulted and no change in management resulted. Throughout admission in the ICU, pt has had persistent hyperactive delirium. Was initially on precedex, but was weaned and stopped on 7/18.  Patient was discharged to [facility] for further rehabilitation as his mobility is declining and a more scheduled rehabilitation routine may improve his delirium.   He presents to the office today for his routine 4 week follow-up visit.  Overall has been doing well from a cardiothoracic standpoint.  He does have multiple other complaints that I have tried to address to my best abilities today.   1. S/P aortic dissection repair    Past Medical History:  Diagnosis Date   Anxiety    Depression    Essential  hypertension    S/P aortic dissection repair 11/05/2020   Straight graft replacement of ascending aorta and proximal transverse aortic arch with re-suspension of native aortic valve and open hemi arch distal anastomosis with aorta to right common carotid bypass and aorta to right subclavian bypass   No past surgical history pertinent negatives on file. Scheduled Meds: Current Outpatient Medications on File Prior to Visit  Medication Sig Dispense Refill   acetaminophen (TYLENOL) 325 MG tablet Take 1-2 tablets (325-650 mg total) by mouth every 4 (four) hours as needed for mild pain.     amLODipine (NORVASC) 10 MG tablet Take 1 tablet (10 mg total) by mouth daily. 30 tablet 0   aspirin 81 MG chewable tablet Chew 1 tablet (81 mg total) by mouth daily.     carvedilol (COREG) 3.125 MG tablet Take 1 tablet (3.125 mg total) by mouth 2 (two) times daily with a meal. 60 tablet 0   ferrous sulfate 325 (65 FE) MG tablet Take 1 tablet (325 mg total) by mouth daily. 30 tablet 3   hydrALAZINE (APRESOLINE) 50 MG tablet Take 1 tablet (50 mg total) by mouth every 8 (eight) hours. 90 tablet 0   oxyCODONE (ROXICODONE) 5 MG/5ML solution Take 5-10 mLs (5-10 mg total) by mouth every 4 (four) hours as needed for severe pain. 473 mL 0   pantoprazole sodium (PROTONIX) 40 mg/20 mL PACK Dissolve 1 packet in 20 mLs (40 mg total) and take by mouth daily. 30 mL 0  polyethylene glycol (MIRALAX / GLYCOLAX) 17 g packet Take 17 g by mouth daily as needed for moderate constipation. 14 each 0   QUEtiapine (SEROQUEL) 50 MG tablet Take 1 tablet (50 mg total) by mouth at bedtime. And 1/2 tablet during the day as needed for agitation 45 tablet 0   rosuvastatin (CRESTOR) 20 MG tablet Take 1 tablet (20 mg total) by mouth daily. 30 tablet 11   tamsulosin (FLOMAX) 0.4 MG CAPS capsule Take 2 capsules (0.8 mg total) by mouth daily after supper. 60 capsule 0   traZODone (DESYREL) 50 MG tablet Take 0.5-1 tablets (25-50 mg total) by mouth at  bedtime as needed for sleep. 30 tablet 0   Zinc Sulfate 220 (50 Zn) MG TABS Take 1 tablet (220 mg total) by mouth daily. 30 tablet 0   No current facility-administered medications on file prior to visit.     No Known Allergies Active Problems:   * No active hospital problems. *  Blood pressure 102/69, pulse 87, resp. rate 20, height 6\' 2"  (1.88 m), weight 133 lb 6.4 oz (60.5 kg), SpO2 98 %.  Cor: Regular rate and rhythm, no murmur Pulmonary: Clear to auscultation bilaterally and in all fields Abdomen: No tenderness Wound: Sternotomy incision healing well with no drainage Extremities: No edema however he does have bilateral foot pain, primarily the bottom of the foot.  He also has left big toe pain.  Extremities are warm and well-perfused.   Subjective Objective: Vital signs (most recent): Blood pressure 102/69, pulse 87, resp. rate 20, height 6\' 2"  (1.88 m), weight 133 lb 6.4 oz (60.5 kg), SpO2 98 %. Assessment & Plan  Doing well s/p emergency aortic dissection repair. He is struggling more with the rehab from his stroke. He has follow-up with Neuro, PT/OT/SLP Hypotension with dizziness-we adjusted his hydralazine to 25 mg 3 times daily and we also decreased his Norvasc to 5 mg daily.  He is to continue to take his blood pressure at home and if it is low or if he is dizzy he is to call our office.  He needs to establish care with a cardiology office. 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  He had one lipoma on his forearm before surgery but it seems that they have multiplied.  They do not seem to bother him.  They are not sore or irritated.  I have mentioned to bring this up with his primary care when he sees them in 2 weeks. Bilateral foot pain-his foot pain seems to be primarily on the bottom of both feet bilaterally.  Could potentially be Planter fasciitis and I have also asked for him to follow-up with his primary care regarding this pain. Left big toe  pain-possible gout.  I have ordered a uric acid and will follow-up.  If it is elevated we will consider allopurinol and colchicine treatment. Sore throat-likely from the intubation tube.  He does have some pain with swallowing but is eating a normal diet without difficulty swallowing.  I asked the patient to continue to monitor and if this gets worse to bring it up to his primary care.  Plan: From a cardiothoracic standpoint the patient is doing very well.  I reviewed his chest x-ray with the patient and family at the bedside today and it was perfect.  His only difficulty with ambulation stems from his stroke and he is getting the appropriate therapies.  Since we addressed multiple complaints today I have asked him to  follow-up in 2 weeks after his primary care appointment so that we may tie up a few loose ends.  He is welcome to call our office before then if he has any questions or concerns.   Sharlene Dory 12/22/2020

## 2020-12-23 ENCOUNTER — Encounter (HOSPITAL_COMMUNITY): Payer: Self-pay | Admitting: *Deleted

## 2020-12-23 ENCOUNTER — Ambulatory Visit: Payer: Medicaid Other

## 2020-12-23 ENCOUNTER — Telehealth: Payer: Self-pay | Admitting: *Deleted

## 2020-12-23 ENCOUNTER — Other Ambulatory Visit: Payer: Self-pay

## 2020-12-23 ENCOUNTER — Ambulatory Visit (HOSPITAL_COMMUNITY)
Admission: EM | Admit: 2020-12-23 | Discharge: 2020-12-23 | Disposition: A | Discharge status: Home / Self Care | Payer: Medicaid Other | Attending: Emergency Medicine | Admitting: Emergency Medicine

## 2020-12-23 DIAGNOSIS — N390 Urinary tract infection, site not specified: Secondary | ICD-10-CM | POA: Diagnosis not present

## 2020-12-23 LAB — POCT URINALYSIS DIPSTICK, ED / UC
Bilirubin Urine: NEGATIVE
Glucose, UA: NEGATIVE mg/dL
Ketones, ur: NEGATIVE mg/dL
Nitrite: POSITIVE — AB
Protein, ur: >=300 mg/dL — AB
Specific Gravity, Urine: 1.015 (ref 1.005–1.030)
Urobilinogen, UA: 1 mg/dL (ref 0.0–1.0)
pH: 6 (ref 5.0–8.0)

## 2020-12-23 MED ORDER — CEPHALEXIN 500 MG PO CAPS: 500.0000 mg | ORAL_CAPSULE | Freq: Two times a day (BID) | ORAL | 0 refills | Status: AC

## 2020-12-23 NOTE — Discharge Instructions (Addendum)
Take the Keflex twice a day for the next 7 days.    You can take Tylenol and/or Ibuprofen as needed for pain relief and fever reduction.   Make sure you are drinking plenty of fluids, especially water.  You can drink cranberry juice to help with symptom relief, but make sure it is cranberry juice and not cranberry cocktail.  You can also try AZO, cranberry pills, or pyridium as needed.    Return or go to the Emergency Department if symptoms worsen or do not improve in the next few days.  

## 2020-12-23 NOTE — Telephone Encounter (Signed)
Shemina PT from [facility] called to report that Jesse [name] is running a temperature  and having complaints of abdominal pain. His temp last night was 102.4 and today was 101.2.  Bp is 98/60 (last recorded in epic was 102/69 12/22/20).  He is complaining of abdominal pain and cannot get out of bed. He has an appt to est PCP 9/14 with Georganna Skeans but they have arranged for him to see [facility] today. He has appt scheduled Hosp [facility] with Jacalyn Lefevre NP here on 12/30/20. By prior MyChart message he was given number for [facility] to call for appt about his foley. He has appt 12/25/20 with Dr Rennis Golden for cardiology hosp follow up.

## 2020-12-23 NOTE — ED Provider Notes (Signed)
[facility]    CSN: [id number] Arrival date & time: 12/23/20  1353      History   Chief Complaint Chief Complaint  Patient presents with   Fever   foley catheter     HPI Jesse [name] is a 45 y.o. male.   Patient here for evaluation of fever and fatigue.  T-max was 102.8 last night, temperature 99.8 in office.  Patient did take Tylenol which did help to bring fever down.  Patient was recently hospitalized and was discharged home with a Foley catheter.  Home health recommended that patient come in for evaluation due to fevers and concern for possible UTI.  Patient does have follow-up with urology at the beginning of next week.  Denies any trauma, injury, or other precipitating event.  Denies any specific alleviating or aggravating factors.  Denies any chest pain, shortness of breath, N/V/D, numbness, tingling, weakness, abdominal pain, or headaches.    The history is provided by the patient.  Fever  Past Medical History:  Diagnosis Date   Anxiety    Depression    Essential hypertension    S/P aortic dissection repair 11/05/2020   Straight graft replacement of ascending aorta and proximal transverse aortic arch with re-suspension of native aortic valve and open hemi arch distal anastomosis with aorta to right common carotid bypass and aorta to right subclavian bypass    Patient Active Problem List   Diagnosis Date Noted   AKI (acute kidney injury) (HCC)    Benign essential HTN    Hyponatremia    Acute blood loss anemia    Urinary retention    Pressure injury of skin 11/17/2020   Respiratory arrest (HCC) 11/14/2020   Acute respiratory failure with hypoxia (HCC) 11/14/2020   Adverse effects of medication 11/14/2020   Hypotension 11/14/2020   Encounter for central line placement    Aortic dissection (HCC) 11/13/2020   Acute ischemic right MCA stroke (HCC) 11/13/2020   Agitation 11/13/2020   Atelectasis    Phlebitis    Polysubstance abuse (HCC)    Elevated  troponin 11/07/2020   New onset left bundle branch block (LBBB) 11/07/2020   Protein-calorie malnutrition, severe 11/07/2020   Encephalopathy acute    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)    Acute hypoxemic respiratory failure (HCC)    Acute thoracic aortic dissection (HCC) 11/05/2020   S/P aortic dissection repair 11/05/2020   Cerebral embolism with cerebral infarction 11/05/2020   Essential hypertension     Past Surgical History:  Procedure Laterality Date   ASCENDING AORTIC ROOT REPLACEMENT N/A 11/04/2020   Procedure: 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 WITH REPLACEMENT OF ASCENDING AORTA AND HEMIARCH USING HEMASHIELD PLATINUM GRAFT AND HEMASHIELD GOLD 14 X GRAFT, RESUSPENSION OF NATIVE VALVE, AORTA TO RIGHT CAROTID BYPASS, AORTA TO RIGHT SUBCLAVIAN BYPASS;  Surgeon: Purcell Nails, MD;  Location: [facility] OR;  Service: Open Heart Surgery;  Laterality: N/A;   TEE WITHOUT CARDIOVERSION N/A 11/04/2020   Procedure: TRANSESOPHAGEAL ECHOCARDIOGRAM (TEE);  Surgeon: Purcell Nails, MD;  Location: [facility] OR;  Service: Open Heart Surgery;  Laterality: N/A;       Home Medications    Prior to Admission medications   Medication Sig Start Date End Date Taking? Authorizing Provider  cephALEXin (KEFLEX) 500 MG capsule Take 1 capsule (500 mg total) by mouth 2 (two) times daily for 7 days. 12/23/20 12/30/20 Yes Ivette Loyal, NP  acetaminophen (TYLENOL) 325 MG tablet Take 1-2 tablets (  325-650 mg total) by mouth every 4 (four) hours as needed for mild pain. 12/01/20   Love, Evlyn Kanner, PA-C  amLODipine (NORVASC) 5 MG tablet Take 1 tablet (5 mg total) by mouth daily. 12/22/20   Sharlene Dory, PA-C  aspirin 81 MG chewable tablet Chew 1 tablet (81 mg total) by mouth daily. 11/27/20   Kathlen Mody, MD  carvedilol (COREG) 3.125 MG tablet Take 1 tablet (3.125 mg total) by mouth 2 (two) times daily with a meal. 12/10/20   Angiulli, Mcarthur Rossetti, PA-C  ferrous sulfate 325 (65 FE) MG  tablet Take 1 tablet (325 mg total) by mouth daily. 12/10/20 04/09/21  Angiulli, Mcarthur Rossetti, PA-C  hydrALAZINE (APRESOLINE) 25 MG tablet Take 1 tablet (25 mg total) by mouth 3 (three) times daily. 12/22/20   Sharlene Dory, PA-C  oxyCODONE (ROXICODONE) 5 MG/5ML solution Take 5-10 mLs (5-10 mg total) by mouth every 4 (four) hours as needed for severe pain. 12/10/20   Angiulli, Mcarthur Rossetti, PA-C  pantoprazole sodium (PROTONIX) 40 mg/20 mL PACK Dissolve 1 packet in 20 mLs (40 mg total) and take by mouth daily. 12/10/20   Angiulli, Mcarthur Rossetti, PA-C  polyethylene glycol (MIRALAX / GLYCOLAX) 17 g packet Take 17 g by mouth daily as needed for moderate constipation. 11/27/20   Kathlen Mody, MD  QUEtiapine (SEROQUEL) 50 MG tablet Take 1 tablet (50 mg total) by mouth at bedtime. And 1/2 tablet during the day as needed for agitation 12/18/20   Ranelle Oyster, MD  rosuvastatin (CRESTOR) 20 MG tablet Take 1 tablet (20 mg total) by mouth daily. 12/10/20 12/10/21  Angiulli, Mcarthur Rossetti, PA-C  tamsulosin (FLOMAX) 0.4 MG CAPS capsule Take 2 capsules (0.8 mg total) by mouth daily after supper. 12/10/20   Angiulli, Mcarthur Rossetti, PA-C  traZODone (DESYREL) 50 MG tablet Take 0.5-1 tablets (25-50 mg total) by mouth at bedtime as needed for sleep. 12/10/20   Angiulli, Mcarthur Rossetti, PA-C  Zinc Sulfate 220 (50 Zn) MG TABS Take 1 tablet (220 mg total) by mouth daily. 12/10/20   Angiulli, Mcarthur Rossetti, PA-C    Family History History reviewed. No pertinent family history.  Social History Social History   Tobacco Use   Smoking status: Former    Types: Cigarettes    Quit date: 11/04/2020    Years since quitting: 0.1   Smokeless tobacco: Never  Vaping Use   Vaping Use: Never used  Substance Use Topics   Alcohol use: Yes    Alcohol/week: 2.0 standard drinks    Types: 2 Cans of beer per week   Drug use: Not Currently     Allergies   Patient has no known allergies.   Review of Systems Review of Systems  Constitutional:  Positive for fatigue  and fever.  All other systems reviewed and are negative.   Physical Exam Triage Vital Signs ED Triage Vitals [12/23/20 1429]  Enc Vitals Group     BP 121/78     Pulse Rate (!) 106     Resp 20     Temp 99.8 F (37.7 C)     Temp src      SpO2 97 %     Weight      Height      Head Circumference      Peak Flow      Pain Score      Pain Loc      Pain Edu?      Excl. in GC?  No data found.  Updated Vital Signs BP 121/78   Pulse (!) 106   Temp 99.8 F (37.7 C)   Resp 20   SpO2 97%   Visual Acuity Right Eye Distance:   Left Eye Distance:   Bilateral Distance:    Right Eye Near:   Left Eye Near:    Bilateral Near:     Physical Exam Vitals and nursing note reviewed.  Constitutional:      General: He is not in acute distress.    Appearance: Normal appearance. He is not diaphoretic.  HENT:     Head: Normocephalic and atraumatic.  Eyes:     Conjunctiva/sclera: Conjunctivae normal.  Cardiovascular:     Rate and Rhythm: Tachycardia present.     Pulses: Normal pulses.     Heart sounds: Normal heart sounds.  Pulmonary:     Effort: Pulmonary effort is normal.     Breath sounds: Normal breath sounds.  Abdominal:     General: Abdomen is flat.     Palpations: Abdomen is soft.  Genitourinary:    Comments: Foley catheter present Musculoskeletal:        General: Normal range of motion.     Cervical back: Normal range of motion.  Skin:    General: Skin is warm and dry.  Neurological:     General: No focal deficit present.     Mental Status: He is alert and oriented to person, place, and time.  Psychiatric:        Mood and Affect: Mood normal.     UC Treatments / Results  Labs (all labs ordered are listed, but only abnormal results are displayed) Labs Reviewed  POCT URINALYSIS DIPSTICK, ED / UC - Abnormal; Notable for the following components:      Result Value   Hgb urine dipstick LARGE (*)    Protein, ur >=300 (*)    Nitrite POSITIVE (*)     Leukocytes,Ua LARGE (*)    All other components within normal limits  URINE CULTURE    EKG   Radiology DG Chest 2 View  Result Date: 12/22/2020 CLINICAL DATA:  Aortic dissection EXAM: CHEST - 2 VIEW COMPARISON:  11/19/2020 FINDINGS: Postsurgical changes are evident within the mediastinum. The heart size and mediastinal contours are within normal limits. No focal airspace consolidation, pleural effusion, or pneumothorax. The visualized skeletal structures are unremarkable. IMPRESSION: No active cardiopulmonary disease. Electronically Signed   By: Duanne GuessNicholas  Plundo D.O.   On: 12/22/2020 13:38    Procedures Procedures (including critical care time)  Medications Ordered in UC Medications - No data to display  Initial Impression / Assessment and Plan / UC Course  I have reviewed the triage vital signs and the nursing notes.  Pertinent labs & imaging results that were available during my care of the patient were reviewed by me and considered in my medical decision making (see chart for details).    Urinalysis positive for hemoglobin, protein, nitrites, and leukocytes.  Urine culture pending.  Will treat with Keflex twice daily for the next 7 days.  Patient has follow-up with urology on Tuesday and encourage patient to keep appointment.  Continue to use Tylenol and/or ibuprofen as needed for fevers.  Encourage fluids and rest.  Strict ED follow-up for any worsening symptoms including worsening fevers or altered mental status. Final Clinical Impressions(s) / UC Diagnoses   Final diagnoses:  Lower urinary tract infectious disease     Discharge Instructions      Take the Keflex  twice a day for the next 7 days.    You can take Tylenol and/or Ibuprofen as needed for pain relief and fever reduction.   Make sure you are drinking plenty of fluids, especially water.  You can drink cranberry juice to help with symptom relief, but make sure it is cranberry juice and not cranberry cocktail.  You  can also try AZO, cranberry pills, or pyridium as needed.    Return or go to the Emergency Department if symptoms worsen or do not improve in the next few days.      ED Prescriptions     Medication Sig Dispense Auth. Provider   cephALEXin (KEFLEX) 500 MG capsule Take 1 capsule (500 mg total) by mouth 2 (two) times daily for 7 days. 14 capsule Ivette Loyal, NP      PDMP not reviewed this encounter.   Ivette Loyal, NP 12/23/20 [phone]

## 2020-12-23 NOTE — ED Triage Notes (Signed)
Pt DC from hospital with Foley Cath.  Pt had fever last night 102.8 pt took tylenol for fever.

## 2020-12-25 ENCOUNTER — Encounter: Payer: Self-pay | Admitting: Internal Medicine

## 2020-12-25 ENCOUNTER — Ambulatory Visit (INDEPENDENT_AMBULATORY_CARE_PROVIDER_SITE_OTHER): Payer: Medicaid Other | Admitting: Internal Medicine

## 2020-12-25 ENCOUNTER — Other Ambulatory Visit: Payer: Self-pay

## 2020-12-25 VITALS — Ht 76.0 in | Wt 130.2 lb

## 2020-12-25 DIAGNOSIS — I951 Orthostatic hypotension: Secondary | ICD-10-CM

## 2020-12-25 DIAGNOSIS — I7101 Dissection of thoracic aorta: Secondary | ICD-10-CM | POA: Diagnosis not present

## 2020-12-25 DIAGNOSIS — I10 Essential (primary) hypertension: Secondary | ICD-10-CM | POA: Diagnosis not present

## 2020-12-25 DIAGNOSIS — I71019 Dissection of thoracic aorta, unspecified: Secondary | ICD-10-CM

## 2020-12-25 DIAGNOSIS — Q676 Pectus excavatum: Secondary | ICD-10-CM | POA: Diagnosis not present

## 2020-12-25 NOTE — Progress Notes (Signed)
OFFICE NOTE  Chief Complaint:  Established cardiologist  Primary Care Physician: Georganna Skeans, MD  HPI:  Jesse [name] is a 45 y.o. male with a past medial history significant for recent presentation of acute onset aphasia and blurry vision.  Prior to this past medical history included anxiety and depression with tobacco and alcohol use but no known medical problems.  He had not been to a physician for many years.  He presented urgently to the emergency department in July with the symptoms and was found to have intracranial hemorrhage with subtle findings on CT angiogram that suggested an acute dissection involving the aortic arch extending through the innominate artery into the right common carotid artery.  Cardiothoracic surgery was emergently consulted and the patient was seen by Dr. Cornelius Moras.  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  The procedure consisted of extracorporeal bypass, replacement of the ascending aorta and proximal transverse aortic arch with a 26 mm Hemashield graft, resuspension of the needed aortic valve, aorto to right common carotid coronary bypass and aortic to right subclavian bypass with a 14 x 8 x 8 mm Hemashield bifurcated graft.  Fortunately he did survive this procedure however had a protracted stay in the ICU.  According to his wife he is lost about 40pounds currently down to 130 pounds with a BMI of 15.85.  Initially was noted to be markedly hypertensive and he had been on a number of medicines for blood pressure.  More recently he has noted to have issues with hypotension.  He was seen in urgent care and was found to be dizzy and has had issues with positional dizziness.  He was also found to have a urinary tract infection.  He does have a urinary catheter in place and is scheduled to see urologist next week hopefully to have  this removed.  He is on tamsulosin 0.8 mg at night.  More recently his blood pressure medicines had been cut back however he still has low blood pressure.  Today's blood pressure was 102/62 at rest lying down and orthostatics were performed showing a drop to 84/62 when standing up.  Heart rate increased from 68-87.  This likely explains his symptoms.  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  He is very tall at 6 foot 4 inches with a thin frame.  He also has a pectus excavatum deformity which could also be associated with connective tissue disorder.  PMHx:  Past Medical History:  Diagnosis Date   Anxiety    Depression    Essential hypertension    S/P aortic dissection repair 11/05/2020   Straight graft replacement of ascending aorta and proximal transverse aortic arch with re-suspension of native aortic valve and open hemi arch distal anastomosis with aorta to right common carotid bypass and aorta to right subclavian bypass    Past Surgical History:  Procedure Laterality Date   ASCENDING AORTIC ROOT REPLACEMENT N/A 11/04/2020   Procedure: 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 WITH REPLACEMENT OF ASCENDING AORTA AND HEMIARCH USING HEMASHIELD PLATINUM GRAFT AND HEMASHIELD GOLD 14 X GRAFT, RESUSPENSION OF NATIVE VALVE, AORTA TO RIGHT CAROTID BYPASS, AORTA TO RIGHT SUBCLAVIAN BYPASS;  Surgeon: Purcell Nails, MD;  Location: [facility] OR;  Service: Open Heart Surgery;  Laterality: N/A;   TEE WITHOUT CARDIOVERSION N/A 11/04/2020   Procedure: TRANSESOPHAGEAL ECHOCARDIOGRAM (TEE);  Surgeon: Tressie Stalker  H, MD;  Location: [facility] OR;  Service: Open Heart Surgery;  Laterality: N/A;    FAMHx:  No family history on file.  No family history of dissection however his biologic son also has similar features as his father.  SOCHx:   reports that he quit smoking about 7 weeks ago. His smoking use included cigarettes. He has never used smokeless tobacco. He reports current alcohol use of  about 2.0 standard drinks per week. He reports that he does not currently use drugs.  ALLERGIES:  No Known Allergies  ROS: Pertinent items noted in HPI and remainder of comprehensive ROS otherwise negative.  HOME MEDS: Current Outpatient Medications on File Prior to Visit  Medication Sig Dispense Refill   acetaminophen (TYLENOL) 325 MG tablet Take 1-2 tablets (325-650 mg total) by mouth every 4 (four) hours as needed for mild pain.     amLODipine (NORVASC) 5 MG tablet Take 2.5 mg by mouth daily.     aspirin 81 MG chewable tablet Chew 1 tablet (81 mg total) by mouth daily.     carvedilol (COREG) 3.125 MG tablet Take 1 tablet (3.125 mg total) by mouth 2 (two) times daily with a meal. 60 tablet 0   cephALEXin (KEFLEX) 500 MG capsule Take 1 capsule (500 mg total) by mouth 2 (two) times daily for 7 days. 14 capsule 0   ferrous sulfate 325 (65 FE) MG tablet Take 1 tablet (325 mg total) by mouth daily. 30 tablet 3   oxyCODONE (ROXICODONE) 5 MG/5ML solution Take 5-10 mLs (5-10 mg total) by mouth every 4 (four) hours as needed for severe pain. 473 mL 0   pantoprazole sodium (PROTONIX) 40 mg/20 mL PACK Dissolve 1 packet in 20 mLs (40 mg total) and take by mouth daily. 30 mL 0   QUEtiapine (SEROQUEL) 50 MG tablet Take 1 tablet (50 mg total) by mouth at bedtime. And 1/2 tablet during the day as needed for agitation 45 tablet 0   rosuvastatin (CRESTOR) 20 MG tablet Take 1 tablet (20 mg total) by mouth daily. 30 tablet 11   tamsulosin (FLOMAX) 0.4 MG CAPS capsule Take 2 capsules (0.8 mg total) by mouth daily after supper. 60 capsule 0   Zinc Sulfate 220 (50 Zn) MG TABS Take 1 tablet (220 mg total) by mouth daily. 30 tablet 0   polyethylene glycol (MIRALAX / GLYCOLAX) 17 g packet Take 17 g by mouth daily as needed for moderate constipation. (Patient not taking: Reported on 12/25/2020) 14 each 0   traZODone (DESYREL) 50 MG tablet Take 0.5-1 tablets (25-50 mg total) by mouth at bedtime as needed for sleep.  (Patient not taking: Reported on 12/25/2020) 30 tablet 0   No current facility-administered medications on file prior to visit.    LABS/IMAGING: Results for orders placed or performed during the hospital encounter of 12/23/20 (from the past 48 hour(s))  POC Urinalysis dipstick     Status: Abnormal   Collection Time: 12/23/20  3:01 PM  Result Value Ref Range   Glucose, UA NEGATIVE NEGATIVE mg/dL   Bilirubin Urine NEGATIVE NEGATIVE   Ketones, ur NEGATIVE NEGATIVE mg/dL   Specific Gravity, Urine 1.015 1.005 - 1.030   Hgb urine dipstick LARGE (A) NEGATIVE   pH 6.0 5.0 - 8.0   Protein, ur >=300 (A) NEGATIVE mg/dL   Urobilinogen, UA 1.0 0.0 - 1.0 mg/dL   Nitrite POSITIVE (A) NEGATIVE   Leukocytes,Ua LARGE (A) NEGATIVE    Comment: Biochemical Testing Only. Please order routine urinalysis from main  lab if confirmatory testing is needed.  Urine Culture     Status: Abnormal (Preliminary result)   Collection Time: 12/23/20  3:18 PM   Specimen: Urine, Catheterized  Result Value Ref Range   Specimen Description URINE, CATHETERIZED    Special Requests NONE    Culture (A)     >=100,000 COLONIES/mL ESCHERICHIA COLI 80,000 COLONIES/mL PSEUDOMONAS AERUGINOSA SUSCEPTIBILITIES TO FOLLOW Performed at [facility], [address] [place], [place] [address]    Report Status PENDING    No results found.  LIPID PANEL:    Component Value Date/Time   CHOL 112 11/06/2020 0510   TRIG 114 11/15/2020 0348   HDL 24 (L) 11/06/2020 0510   CHOLHDL 4.7 11/06/2020 0510   VLDL 39 11/06/2020 0510   LDLCALC 49 11/06/2020 0510     WEIGHTS: Wt Readings from Last 3 Encounters:  12/25/20 130 lb 3.2 oz (59.1 kg)  12/22/20 133 lb 6.4 oz (60.5 kg)  12/11/20 133 lb 9.6 oz (60.6 kg)    VITALS: Ht 6\' 4"  (1.93 m)   Wt 130 lb 3.2 oz (59.1 kg)   SpO2 98%   BMI 15.85 kg/m   EXAM: General appearance: alert, no distress, and tall and thin with long limbs Neck: no carotid bruit, no JVD, and thyroid not  enlarged, symmetric, no tenderness/mass/nodules Lungs: clear to auscultation bilaterally Heart: regular rate and rhythm, S1, S2 normal, no murmur, click, rub or gallop and pectus excavatum deformity Abdomen: soft, non-tender; bowel sounds normal; no masses,  no organomegaly and scaphoid, post-surgical scars Extremities: extremities normal, atraumatic, no cyanosis or edema Pulses: 2+ and symmetric Skin: Skin color, texture, turgor normal. 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   Mr. Quevedo unfortunately had acute aortic dissection and fortunately survived although suffered a right hemispheric stroke and has significant aphasia.  He had a protracted hospital course and inpatient rehab stay.  He is required a Foley catheter for urinary retention and recently had a urinary tract infection, now on antibiotics.  He has features suggestive of a marfanoid or connective tissue disorder.  I would like to refer him to our genetics clinic for testing of aortopathy's.  He does have a son with similar clinical features who could be at risk in the future.  In addition he has been struggling with dizziness and imbalance which I suspect is related to orthostatic hypotension.  His blood pressure was low today and he was orthostatic.  I advised discontinuing hydralazine completely and cutting back his amlodipine from 5 to 2.5 mg daily.  Hopefully his urologist could further decrease or stop his tamsulosin after his  urinary catheter is removed if his urinary function has improved.  He has a number of different follow-up's in the future including with thoracic surgery, neurology, primary care, urology, etc.  I will plan to follow-up with him in about 3 months for general cardiac issues.  Hopefully by that point he will have had his genetic testing and I will have a better idea if he does indeed have genetic risk for this dissection as an explanation.  TIME SPENT WITH PATIENT: 60 minutes of direct patient care. More than 50% of that time was spent on coordination of care and counseling regarding recent acute aortic dissection status post repair, extensive chart review, discussion and management about orthostatic hypotension, review of recent treatment of urinary tract infection, discussion regarding possible genetic risk of aortopathy and plans for further testing.  Chrystie NoseKenneth C. Gregori Abril, MD, [facility] - ClevelandFACC, FACP  [place]  [facility]  Medical Director of the Advanced Lipid Disorders &  Cardiovascular Risk Reduction Clinic Diplomate of the American Board of Clinical Lipidology Attending Cardiologist  Direct Dial: [phone]  Fax: [phone]  Website:  [email or web address]   Addalynne Golding C Shareece Bultman 12/25/2020, 1:46 PM

## 2020-12-25 NOTE — Patient Instructions (Signed)
Medication Instructions:  STOP hydralazine DECREASE amlodipine to 2.5mg  daily  *If you need a refill on your cardiac medications before your next appointment, please call your pharmacy*   Follow-Up: At [facility], you and your health needs are our priority.  As part of our continuing mission to provide you with exceptional heart care, we have created designated Provider Care Teams.  These Care Teams include your primary Cardiologist (physician) and Advanced Practice Providers (APPs -  Physician Assistants and Nurse Practitioners) who all work together to provide you with the care you need, when you need it.  We recommend signing up for the patient portal called "MyChart".  Sign up information is provided on this After Visit Summary.  MyChart is used to connect with patients for Virtual Visits (Telemedicine).  Patients are able to view lab/test results, encounter notes, upcoming appointments, etc.  Non-urgent messages can be sent to your provider as well.   To learn more about what you can do with MyChart, go to [email or web address].    Your next appointment:   3 month(s)  The format for your next appointment:   In Person  Provider:   You may see Dr. Rennis Golden or one of the following Advanced Practice Providers on your designated Care Team:   Azalee Course, PA-C Micah Flesher, [place] or  Judy Pimple, [place]   Other Instructions You have been referred to Dr. Sidney Ace (genetics doctor) She sees patients at [address] [place]

## 2020-12-26 ENCOUNTER — Telehealth: Payer: Self-pay | Admitting: *Deleted

## 2020-12-26 LAB — URINE CULTURE: Culture: >=100000 — AB

## 2020-12-26 NOTE — Telephone Encounter (Signed)
Prior auth submitted for quetiapine to [facility] via CoverMyMeds. Approved on August 26 PA Case: [id number], Status: Approved, Coverage Starts on: 12/19/2020 12:00:00 AM, Coverage Ends on: 12/19/2021 12:00:00 AM.

## 2020-12-30 ENCOUNTER — Other Ambulatory Visit (HOSPITAL_COMMUNITY): Payer: Self-pay

## 2020-12-30 ENCOUNTER — Other Ambulatory Visit: Payer: Self-pay

## 2020-12-30 ENCOUNTER — Other Ambulatory Visit: Payer: Self-pay | Admitting: Physical Medicine & Rehabilitation

## 2020-12-30 ENCOUNTER — Other Ambulatory Visit (HOSPITAL_BASED_OUTPATIENT_CLINIC_OR_DEPARTMENT_OTHER): Payer: Self-pay

## 2020-12-30 ENCOUNTER — Encounter: Payer: Medicaid Other | Attending: Registered Nurse | Admitting: Registered Nurse

## 2020-12-30 VITALS — BP 105/72 | HR 94 | Temp 98.0°F | Ht 76.0 in | Wt 133.2 lb

## 2020-12-30 DIAGNOSIS — I10 Essential (primary) hypertension: Secondary | ICD-10-CM | POA: Diagnosis not present

## 2020-12-30 DIAGNOSIS — R339 Retention of urine, unspecified: Secondary | ICD-10-CM

## 2020-12-30 DIAGNOSIS — I63511 Cerebral infarction due to unspecified occlusion or stenosis of right middle cerebral artery: Secondary | ICD-10-CM | POA: Diagnosis not present

## 2020-12-30 DIAGNOSIS — R338 Other retention of urine: Secondary | ICD-10-CM | POA: Diagnosis not present

## 2020-12-30 NOTE — Progress Notes (Signed)
Subjective:    Patient ID: Jesse [name], male    DOB: [birth year], 45 y.o.   MRN: [id number]  HPI: [name] is a 45 y.o. male who returns for  hospital follow up appointment for his Acute Ischemic Right MCA Infarction, Essential Hypertension and Urinary Retention. Marland Kitchen  He was  brought to [facility], he had acute onset aphasia and blurry vision right eye.  Dr Barry Dienes HPI HPI:   Patient is a 45 year old male who presented to the emergency department with acute onset aphasia and blurry vision and has been referred for emergent surgical consultation due to discovery of acute type a aortic dissection.  Patient has history of anxiety and depression with longstanding tobacco and alcohol use but no other significant chronic medical problems, although the patient has not seen a physician for routine checkup in many years.  He was reportedly in his usual state of health until approximately 12 noon today when he was out mowing the lawn and he developed sudden onset chest pain radiating to his neck.  The pain seemed to subside but over the next several hours it became apparent the patient had developed severe acute aphasia as well as blurry vision in his right eye.  The patient's wife found him laying in the attic confused and called EMS.  The patient was noted to have severe receptive and expressive aphasia.  Code stroke was activated and the patient was brought to the emergency department at [facility] where initial CT of the head revealed no acute intracranial hemorrhage and only subtle findings but CT angiogram revealed acute dissection involving the aortic arch extending through the innominate artery into the right common carotid artery.  Emergent cardiothoracic surgical consultation was requested. Cardiothoracic Following.   CT Head:  IMPRESSION: No acute intracranial hemorrhage. Question subtle small loss of gray-white differentiation in the right insular region  (ASPECT score is 9).   Possible hyperdense distal right M2 or proximal M3 MCA branch in the posterior sylvian fissure.   CT Angio:  IMPRESSION: Partially imaged type A aortic dissection involving the ascending aorta and transverse portion but not the descending aorta.   Dissection involves innominate artery with thrombosis of false lumen. This extends into the right common carotid with narrowing of the true lumen to a minimal diameter of 1.5 mm. Minimal enhancement within the right cervical and intracranial ICA.   Reconstitution at the right ICA terminus. No proximal intracranial vessel occlusion. 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   MRI: MRA:   IMPRESSION: MRI HEAD IMPRESSION:   1. 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 Associated regional mass effect with 5 mm of right-to-left shift, slightly increased from prior. 2. Additional scattered subcentimeter acute ischemic infarcts involving the bilateral cerebral hemispheres and right cerebellum, embolic in nature. No associated hemorrhage or mass effect. 3. No other acute intracranial abnormality.   MRA HEAD IMPRESSION:   1. Interval re-establishment of flow within the right ICA, now patent to the terminus. Right MCA well perfused, with no visible persistent downstream occlusion evident by MRA. 2. Otherwise stable and unremarkable intracranial MRA.   MRA NECK IMPRESSION:   1. 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 2. Right carotid artery system supplied via the right subclavian bypass, and is now widely  patent without residual dissection or other abnormality. 3. Left carotid artery system supplied via the native aortic arch, and remains widely patent  without stenosis or other abnormality. 4. 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 Follow-up examination with dedicated CTA could be performed for further evaluation of this finding as warranted. 5. Both vertebral arteries remain widely patent within the neck. Right vertebral artery dominant.  He underwent on 11/04/2020 by Dr Cornelius Moras         Procedure Laterality Anesthesia  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 WITH REPLACEMENT OF ASCENDING AORTA AND HEMIARCH USING HEMASHIELD PLATINUM GRAFT AND HEMASHIELD GOLD 14 X GRAFT, RESUSPENSION OF NATIVE VALVE, AORTA TO RIGHT CAROTID BYPASS, AORTA TO RIGHT SUBCLAVIAN BYPASS N/A General  TRANSESOPHAGEAL ECHOCARDIOGRAM        Mr. Mizrahi had a long hospital stay see [facility] discharge summary for more details.   He was admitted to inpatient rehabilitation on 11/27/2020 and discharged home on 12/11/2020. He has a scheduled appointment with Neuro Rehabilitation outpatient therapy on 01/05/2021. He denies any pain. Also reports he has a good appetite  Mr. Bublitz has expressive aphasia, wife in room all questions answered.    Pain Inventory Average Pain 4 Pain Right Now 6 My pain is intermittent, burning, tingling, and aching  LOCATION OF PAIN  head, neck, hand, fingers, back, groin, toes, chest  BOWEL Number of stools per week: 2 Oral laxative use No  Type of laxative na Enema or suppository use No  History of colostomy No  Incontinent No   BLADDER Foley In and out cath, frequency na Able to self cath No  Bladder incontinence Yes  Frequent urination No  Leakage with coughing No  Difficulty starting stream No  Incomplete bladder emptying Yes    Mobility walk with assistance use a cane how many minutes can you walk? 2 ability to climb steps?  yes do you drive?  no  Function disabled: date disabled 11/04/2020 I  need assistance with the following:  feeding, dressing, bathing, toileting, meal prep, household duties, and shopping  Neuro/Psych bladder control problems weakness numbness tingling trouble walking dizziness confusion depression anxiety  Prior Studies x-rays  Physicians involved in your care Cardiologist/Cardiovascular   No family history on file. Social History   Socioeconomic History  . Marital status: Married    Spouse name: Not on file  . Number of children: Not on file  . Years of education: Not on file  . Highest education level: 9th grade  Occupational History  . Not on file  Tobacco Use  . Smoking status: Former    Types: Cigarettes    Quit date: 11/04/2020    Years since quitting: 0.1  . Smokeless tobacco: Never  Vaping Use  . Vaping Use: Never used  Substance and Sexual Activity  . Alcohol use: Yes    Alcohol/week: 2.0 standard drinks    Types: 2 Cans of beer per week  . Drug use: Not Currently  . Sexual activity: Yes  Other Topics Concern  . Not on file  Social History Narrative  . Not on file   Social Determinants of Health   Financial Resource Strain: Not on file  Food Insecurity: Not on file  Transportation Needs: Not on file  Physical Activity: Not on file  Stress: Not on file  Social Connections: Not on file   Past Surgical History:  Procedure Laterality Date  .  ASCENDING AORTIC ROOT REPLACEMENT N/A 11/04/2020   Procedure: 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 WITH REPLACEMENT OF ASCENDING AORTA AND HEMIARCH USING HEMASHIELD PLATINUM 26MM GRAFT AND HEMASHIELD GOLD 14 X 8MM GRAFT, RESUSPENSION OF NATIVE VALVE, AORTA TO RIGHT CAROTID BYPASS, AORTA TO RIGHT SUBCLAVIAN BYPASS;  Surgeon: Purcell Nailswen, Clarence H, MD;  Location: [facility] OR;  Service: Open Heart Surgery;  Laterality: N/A;  . TEE WITHOUT CARDIOVERSION N/A 11/04/2020   Procedure: TRANSESOPHAGEAL ECHOCARDIOGRAM (TEE);  Surgeon: Purcell Nailswen, Clarence H, MD;  [facility];  Service: Open Heart  Surgery;  Laterality: N/A;   Past Medical History:  Diagnosis Date  . Anxiety   . Depression   . Essential hypertension   . S/P aortic dissection repair 11/05/2020   Straight graft replacement of ascending aorta and proximal transverse aortic arch with re-suspension of native aortic valve and open hemi arch distal anastomosis with aorta to right common carotid bypass and aorta to right subclavian bypass   BP 105/72   Pulse 94   Temp 98 F (36.7 C) (Oral)   Ht 6\' 4"  (1.93 m)   Wt 133 lb 3.2 oz (60.4 kg)   SpO2 98%   BMI 16.21 kg/m   Opioid Risk Score:   Fall Risk Score:  `1  Depression screen PHQ 2/9  Depression screen PHQ 2/9 12/30/2020  Decreased Interest 1  Down, Depressed, Hopeless 2  PHQ - 2 Score 3  Altered sleeping 2  Tired, decreased energy 2  Change in appetite 1  Feeling bad or failure about yourself  2  Trouble concentrating 2  Moving slowly or fidgety/restless 2  Suicidal thoughts 1  PHQ-9 Score 15  Difficult doing work/chores Somewhat difficult      Review of Systems  Constitutional:  Positive for appetite change.  Gastrointestinal:  Positive for constipation and diarrhea.  Genitourinary:  Positive for decreased urine volume and dysuria.  All other systems reviewed and are negative.     Objective:   Physical Exam Vitals and nursing note reviewed.  Constitutional:      Appearance: Normal appearance.  Cardiovascular:     Rate and Rhythm: Normal rate and regular rhythm.     Pulses: Normal pulses.     Heart sounds: Normal heart sounds.  Pulmonary:     Effort: Pulmonary effort is normal.     Breath sounds: Normal breath sounds.  Genitourinary:    Comments: Foley Cath: Clear yellow Urine  Musculoskeletal:     Cervical back: Normal range of motion and neck supple.     Comments: Normal Muscle Bulk and Muscle Testing Reveals:  Upper Extremities: Full ROM and Muscle Strength 4/5 Lower Extremities: Full ROM and Muscle Strength 5/5 Arises from Table  Slowly Narrow Based  Gait     Skin:    General: Skin is warm and dry.  Neurological:     Mental Status: He is alert and oriented to person, place, and time.  Psychiatric:        Mood and Affect: Mood normal.        Behavior: Behavior normal.         Assessment & Plan:  1.Acute Ischemic Right MCA Infarction: He has a scheduled appointment with Neuro-Rehabilitation on 01/05/2021. [facility] Continue current medication regimen. Continue to monitor.  2. Essential Hypertension :Continue current medication regimen. Continue to monitor.  3. Urinary Retention.Urology Following: He had a Foley placed today: Clear yellow urine. Continue to monitor.   F/U with Dr  Kirsteins in 4- 6 weeks

## 2020-12-31 ENCOUNTER — Ambulatory Visit: Payer: Self-pay | Admitting: Family Medicine

## 2021-01-01 ENCOUNTER — Telehealth: Payer: Self-pay | Admitting: Registered Nurse

## 2021-01-01 NOTE — Telephone Encounter (Signed)
Return Ms. Curl call no answer. Left message to return the call.

## 2021-01-01 NOTE — Telephone Encounter (Signed)
Spoke with Ms. Jesse [name], she reports [name] is reporting catherer pain. He had a foley catheter placed by [facility] on 12/30/2020. She states it is draining clear yellow urine and no blood noted. She was instructed to call [facility], she verbalizes understanding. Also asked she will send a update via My-Chart, she verbalizes understanding.

## 2021-01-02 ENCOUNTER — Encounter: Payer: Self-pay | Admitting: Registered Nurse

## 2021-01-02 ENCOUNTER — Other Ambulatory Visit: Payer: Self-pay | Admitting: Thoracic Surgery (Cardiothoracic Vascular Surgery)

## 2021-01-02 DIAGNOSIS — I712 Thoracic aortic aneurysm, without rupture, unspecified: Secondary | ICD-10-CM

## 2021-01-05 ENCOUNTER — Encounter: Payer: Self-pay | Admitting: Physical Therapy

## 2021-01-05 ENCOUNTER — Other Ambulatory Visit: Payer: Self-pay

## 2021-01-05 ENCOUNTER — Ambulatory Visit: Payer: Medicaid Other | Admitting: Physical Therapy

## 2021-01-05 ENCOUNTER — Ambulatory Visit: Payer: Medicaid Other | Attending: Physician Assistant | Admitting: Occupational Therapy

## 2021-01-05 ENCOUNTER — Encounter: Payer: Self-pay | Admitting: Occupational Therapy

## 2021-01-05 ENCOUNTER — Ambulatory Visit: Payer: Medicaid Other

## 2021-01-05 VITALS — BP 97/74 | HR 96

## 2021-01-05 DIAGNOSIS — R41840 Attention and concentration deficit: Secondary | ICD-10-CM | POA: Insufficient documentation

## 2021-01-05 DIAGNOSIS — R278 Other lack of coordination: Secondary | ICD-10-CM

## 2021-01-05 DIAGNOSIS — R2689 Other abnormalities of gait and mobility: Secondary | ICD-10-CM | POA: Diagnosis not present

## 2021-01-05 DIAGNOSIS — R4701 Aphasia: Secondary | ICD-10-CM | POA: Diagnosis not present

## 2021-01-05 DIAGNOSIS — I69354 Hemiplegia and hemiparesis following cerebral infarction affecting left non-dominant side: Secondary | ICD-10-CM | POA: Insufficient documentation

## 2021-01-05 DIAGNOSIS — R293 Abnormal posture: Secondary | ICD-10-CM | POA: Insufficient documentation

## 2021-01-05 DIAGNOSIS — R482 Apraxia: Secondary | ICD-10-CM

## 2021-01-05 DIAGNOSIS — M6281 Muscle weakness (generalized): Secondary | ICD-10-CM | POA: Diagnosis not present

## 2021-01-05 DIAGNOSIS — R2681 Unsteadiness on feet: Secondary | ICD-10-CM | POA: Diagnosis not present

## 2021-01-05 DIAGNOSIS — R41842 Visuospatial deficit: Secondary | ICD-10-CM | POA: Insufficient documentation

## 2021-01-05 NOTE — Patient Instructions (Signed)
  Practice saying family names

## 2021-01-05 NOTE — Therapy (Addendum)
[facility] [address] [place], [place], [address] Phone: [phone]   Fax:  [phone]  Physical Therapy Evaluation  Patient Details  Name: Jesse [name] MRN: [id number] Date of Birth: 1975-07-20 Referring Provider (PT): Angiulli (follow up with Ihor Austin, NP)   Encounter Date: 01/05/2021   PT End of Session - 01/05/21 2125     Visit Number 1    Number of Visits 7    Date for PT Re-Evaluation 02/20/21    Authorization Type Medicaid [employer]-auth requested    PT Start Time 1056    PT Stop Time 1145    PT Time Calculation (min) 49 min    Equipment Utilized During Treatment Gait belt    Activity Tolerance Patient tolerated treatment well    Behavior During Therapy [facility] for tasks assessed/performed             Past Medical History:  Diagnosis Date   Anxiety    Depression    Essential hypertension    S/P aortic dissection repair 11/05/2020   Straight graft replacement of ascending aorta and proximal transverse aortic arch with re-suspension of native aortic valve and open hemi arch distal anastomosis with aorta to right common carotid bypass and aorta to right subclavian bypass    Past Surgical History:  Procedure Laterality Date   ASCENDING AORTIC ROOT REPLACEMENT N/A 11/04/2020   Procedure: 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 WITH REPLACEMENT OF ASCENDING AORTA AND HEMIARCH USING HEMASHIELD PLATINUM GRAFT AND HEMASHIELD GOLD 14 X GRAFT, RESUSPENSION OF NATIVE VALVE, AORTA TO RIGHT CAROTID BYPASS, AORTA TO RIGHT SUBCLAVIAN BYPASS;  Surgeon: Purcell Nails, MD;  Location: [facility] OR;  Service: Open Heart Surgery;  Laterality: N/A;   TEE WITHOUT CARDIOVERSION N/A 11/04/2020   Procedure: TRANSESOPHAGEAL ECHOCARDIOGRAM (TEE);  Surgeon: Purcell Nails, MD;  Location: [facility] OR;  Service: Open Heart Surgery;  Laterality: N/A;    Vitals:   01/05/21 1121  BP: 97/74  Pulse: 96       Subjective Assessment - 01/05/21 1104     Subjective Wife helps with history during evaluation.  He was admitted to hospital 11/04/20, with CVA and heart attack.  He was discharged from [facility] 12/11/20, then had HHPT, OT, speech at home, finished up late last week.  Has some sensation issues on R, which causes pain; weakness is on L side.  Wife reports he is walking at home with supervision-has some sway and stumbles at times.  Not currently useing walker or cane.    Patient is accompained by: Family member   wife, [name]   Patient Stated Goals Pt's goals for therapy are to "get back to me".  He wants to go fishing, long distances.    Currently in Pain? No/denies                [facility] PT Assessment - 01/05/21 1108       Assessment   Medical Diagnosis R MCA CVA    Referring Provider (PT) Angiulli (follow up with Ihor Austin, NP)    Onset Date/Surgical Date 11/04/20    Hand Dominance Right    Prior Therapy CIR, then HHPT      Precautions   Precautions Fall    Precaution Comments No lifting (due to subclavian bypass to repair aortic dissection), aphasia, Foley cath in place      Balance Screen   Has the patient fallen in the past 6 months No    Has  the patient had a decrease in activity level because of a fear of falling?  Yes    Is the patient reluctant to leave their home because of a fear of falling?  No      Home Environment   Living Environment Private residence    Living Arrangements Spouse/significant other;Children   19, 13, 10   Available Help at Discharge Family    Type of Home House    Home Access Stairs to enter    Entrance Stairs-Number of Steps 2    Entrance Stairs-Rails None    Home Layout One level    Home Equipment Walker - 2 wheels;Gilmer Mor - single point      Prior Function   Level of Independence Independent    Vocation Full time employment    Vocation Requirements tow truck driver    Leisure fishing, hunting, sports, coaching baseball      Observation/Other  Assessments   Focus on Therapeutic Outcomes (FOTO)  Not captured/initiated by front office staff      Sensation   Light Touch Appears Intact   to [employer]   Gross Motor Movements are Fluid and Coordinated No   with testing for BLE ROM and strength testing, pt moves UEs as well     Posture/Postural Control   Posture/Postural Control Postural limitations    Postural Limitations Rounded Shoulders;Forward head      ROM / Strength   AROM / PROM / Strength AROM;Strength      AROM   Overall AROM  Within functional limits for tasks performed      Strength   Overall Strength Deficits    Overall Strength Comments Grossly tested:  R hip flexion 5/5, L hip flexion 4/5, R quads 5/5, L quads 4/5, L dorsiflexion 3+/5, R ankle dorsiflexion 4/5      Transfers   Transfers Sit to Stand;Stand to Sit    Sit to Stand 5: Supervision;With upper extremity assist;From chair/3-in-1    Stand to Sit 5: Supervision;With upper extremity assist;To chair/3-in-1      Ambulation/Gait   Ambulation/Gait Yes    Ambulation/Gait Assistance 4: Min guard    Ambulation Distance (Feet) 150 Feet    Assistive device None    Gait Pattern Step-through pattern;Decreased arm swing - left;Decreased step length - left;Narrow base of support   Neck flexed, keeps eyes looking at ground   Gait velocity 14.69 sec = 2.23 ft/sec      Standardized Balance Assessment   Standardized Balance Assessment Timed Up and Go Test;Dynamic Gait Index      Dynamic Gait Index   Level Surface Moderate Impairment   8.63   Change in Gait Speed Mild Impairment    Gait with Horizontal Head Turns Moderate Impairment   11.84 sec with veering   Gait with Vertical Head Turns Moderate Impairment   11.6 sec   Gait and Pivot Turn Mild Impairment    Step Over Obstacle Severe Impairment    Step Around Obstacles Mild Impairment    Steps Mild Impairment    Total Score 11    DGI comment: Scores <19/24 indicate increased fall risk.      Timed  Up and Go Test   Normal TUG (seconds) 22.84    TUG Comments Scores >13.5 sec indicate increased fall risk.      High Level Balance   High Level Balance Comments Single limb stance:  RLE 10 sec, LLE <1 sec; tandem stance 15 sec with RLE  posterior, unable to perform with LLE posterior position.                        Objective measurements completed on examination: See above findings.                PT Education - 01/05/21 2124     Education Details Eval results, POC    Person(s) Educated Patient;Spouse    Methods Explanation    Comprehension Verbalized understanding              PT Short Term Goals - 01/05/21 2135       PT SHORT TERM GOAL #1   Title Pt will perform HEP with family supervision for improved strength, balance, transfers, and gait.  TARGET  01/30/2021    Baseline No current HEP    Time 3    Period Weeks    Status New      PT SHORT TERM GOAL #2   Title Pt will perform sit<>stand transfers, at least 5 reps minimal to no UE support and independently for improved functional strength.    Baseline UE support and supervision for sit<>stand    Time 3    Period Weeks    Status New      PT SHORT TERM GOAL #3   Title Pt will improve TUG score to less than or equal to 15 sec for decreased fall risk.    Baseline 22.84 sec (>13.5 sec indicates increased fall risk)    Time 3    Period Weeks    Status New      PT SHORT TERM GOAL #4   Title Pt will improve DGI score to at least 14/24 to decrease fall risk.    Baseline 11/24 (scores <19/24 indicate increased fall risk)    Time 3    Period Weeks    Status New      PT SHORT TERM GOAL #5   Title Pt/wife will verbalize understanding of fall prevention in the home environment.    Baseline Pt at fall risk per TUG and DGI scores.    Time 3    Period Weeks    Status New               PT Long Term Goals - 01/05/21 2139       PT LONG TERM GOAL #1   Title Pt will perform progression of  HEP with family supervision for improved strength, balance, transfers, and gait.    Baseline no current HEP    Time 6    Period Weeks    Status New      PT LONG TERM GOAL #2   Title Pt will improve gait velocity to at least 2.62 ft/sec for improved gait efficiency and safety.    Baseline 2.23 ft/sec    Time 6    Period Weeks    Status New      PT LONG TERM GOAL #3   Title Pt will improve TUG score to less than or equal to 13.5 sec for decreased fall risk.    Baseline 22.84 sec    Time 6    Period Weeks    Status New      PT LONG TERM GOAL #4   Title Pt will improve DGI score to at least 19/24 to decrease fall risk.    Baseline 11/24    Time 6    Period Weeks    Status New  PT LONG TERM GOAL #5   Title Pt will ambulate at least 1000 ft, indoors and outdoors, no device, independently for improved household and community gait for activities with children and family.    Baseline Currently min guard for 150 ft/indoor distances    Time 6    Period Weeks    Status New                    Plan - 01/05/21 2127     Clinical Impression Statement Pt is a 45 year old male who presents to OPPT for evaluation today.  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  He participated in therapies in CIR from and was discharged home 12/11/2020, then had HHPT, OT, speech therapy.  He presents today with decreased strength, decreased balance, decreased independence with gait and transfers, global aphasia (documented by speech/OT).  He is at fall risk per DGI and TUG scores and demonstrates limited community ambulator status with gait velocity.  Prior to CVA, he was independent, working and enjoyed fishing, hunting, outdoor activities with his children.  He would benefit from skilled PT to address the above stated deficits to decrease fall risk and improve overall functional mobility and independence.     Personal Factors and Comorbidities Comorbidity 3+    Comorbidities PMH: Anxiety, Depression, Essential hypertension    Examination-Activity Limitations Locomotion Level;Transfers;Sit;Stairs;Stand;Dressing;Toileting;Caring for Others    Examination-Participation Restrictions Occupation;Community Activity;Driving;Interpersonal Relationship    Stability/Clinical Decision Making Unstable/Unpredictable   postural hypotension, low blood pressure   Clinical Decision Making High    Rehab Potential Good    PT Frequency 1x / week    PT Duration 6 weeks   plus eval   PT Treatment/Interventions ADLs/Self Care Home Management;Electrical Stimulation;DME Instruction;Gait training;Stair training;Functional mobility training;Therapeutic activities;Therapeutic exercise;Balance training;Neuromuscular re-education;Passive range of motion;Patient/family education;Orthotic Fit/Training;Vestibular    PT Next Visit Plan Initiate HEP for balance and functional strength; basically each visit, need to add to/update HEP for wife to assist with exercises at home:  squats, SLS, tandem stance, corner balance, gait with head turns/counter balance exercises.  Need to monitor vitals, as pt has been experiencing low BP    Consulted and Agree with Plan of Care Patient;Family member/caregiver    Family Member Consulted wife             Patient will benefit from skilled therapeutic intervention in order to improve the following deficits and impairments:  Abnormal gait, Difficulty walking, Decreased balance, Decreased mobility, Decreased strength, Postural dysfunction  Visit Diagnosis: Unsteadiness on feet  Other abnormalities of gait and mobility  Muscle weakness (generalized)  Abnormal posture     Problem List Patient Active Problem List   Diagnosis Date Noted   AKI (acute kidney injury) (HCC)    Benign essential HTN    Hyponatremia    Acute blood loss anemia    Urinary retention    Pressure injury of skin  11/17/2020   Respiratory arrest (HCC) 11/14/2020   Acute respiratory failure with hypoxia (HCC) 11/14/2020   Adverse effects of medication 11/14/2020   Hypotension 11/14/2020   Encounter for central line placement    Aortic dissection (HCC) 11/13/2020   Acute ischemic right MCA stroke (HCC) 11/13/2020   Agitation 11/13/2020   Atelectasis    Phlebitis    Polysubstance abuse (HCC)    Elevated troponin 11/07/2020   New onset left bundle branch block (LBBB) 11/07/2020   Protein-calorie  malnutrition, severe 11/07/2020   Encephalopathy acute    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)    Acute hypoxemic respiratory failure (HCC)    Acute thoracic aortic dissection (HCC) 11/05/2020   S/P aortic dissection repair 11/05/2020   Cerebral embolism with cerebral infarction 11/05/2020   Essential hypertension      Managed medicaid CPT codes: [id number]- Therapeutic Exercise, [phone]- Neuro Re-education, [phone] - Gait Training, [phone] - Therapeutic Activities, [phone] - Self Care, [phone] - Electrical stimulation (unattended), [phone] - Electrical stimulation (Manual), and [id number] - Orthotic Fit  Dhruti Ghuman W., PT 01/05/2021, 9:43 PM Gean MaidensMARRIOTT,Ahamed Hofland W., PT   [facility] [facility] [address] [place], [place], [address] Phone: [phone]   Fax:  [phone]  Name: Ephriam JenkinsBradley Maker MRN: [id number] Date of Birth: 1975-05-25

## 2021-01-05 NOTE — Therapy (Addendum)
[facility] [address] [place], [place], [address] Phone: [phone]   Fax:  [phone]  Occupational Therapy Evaluation  Patient Details  Name: Jesse [name] MRN: [id number] Date of Birth: January 26, 1976 Referring Provider (OT): f/u Ihor AustinJessica McCue, NP   Encounter Date: 01/05/2021   OT End of Session - 01/05/21 1011     Visit Number 1    Number of Visits 7    Date for OT Re-Evaluation 03/03/21   6 visits over 8 weeks   Authorization Type [employer]    Authorization Time Period 27 combined, Auth req'd    OT Start Time 1015    OT Stop Time 1100    OT Time Calculation (min) 45 min    Activity Tolerance Patient tolerated treatment well    Behavior [facility] for tasks assessed/performed             Past Medical History:  Diagnosis Date   Anxiety    Depression    Essential hypertension    S/P aortic dissection repair 11/05/2020   Straight graft replacement of ascending aorta and proximal transverse aortic arch with re-suspension of native aortic valve and open hemi arch distal anastomosis with aorta to right common carotid bypass and aorta to right subclavian bypass    Past Surgical History:  Procedure Laterality Date   ASCENDING AORTIC ROOT REPLACEMENT N/A 11/04/2020   Procedure: 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 WITH REPLACEMENT OF ASCENDING AORTA AND HEMIARCH USING HEMASHIELD PLATINUM 26MM GRAFT AND HEMASHIELD GOLD 14 X 8MM GRAFT, RESUSPENSION OF NATIVE VALVE, AORTA TO RIGHT CAROTID BYPASS, AORTA TO RIGHT SUBCLAVIAN BYPASS;  Surgeon: Purcell Nailswen, Clarence H, MD;  Location: [facility] OR;  Service: Open Heart Surgery;  Laterality: N/A;   TEE WITHOUT CARDIOVERSION N/A 11/04/2020   Procedure: TRANSESOPHAGEAL ECHOCARDIOGRAM (TEE);  Surgeon: Purcell Nailswen, Clarence H, MD;  [facility];  Service: Open Heart Surgery;  Laterality: N/A;    There were no vitals filed for this visit.   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 Pt with PMH significant for anxiety, depression and HTN. Pt with global aphasia but accompanied by spouse, Almira CoasterGina, to assist with communication for evaluation today. Pt reports deficits with LUE weakness, vision (although difficulty to fully assess d/t aphasia), and unsteadiness on feet. Pt unable to state primary goal for therapy however verbalized and gestured understanding and agreement with goal plan. Pt was independent prior to CVA and lives with spouse, 3 kids and cats.    Patient is accompanied by: Family member   spouse, Gina   Pertinent History Anxiety, Depression, Essential hypertension.    Limitations Global Aphasia, Fall    Patient Stated Goals unable to state d/t aphasia    Currently in Pain? No/denies    Pain Score 0-No pain               [facility] OT Assessment - 01/05/21 1020       Assessment   Medical Diagnosis R MCA    Referring Provider (OT) f/u Ihor AustinJessica McCue, NP    Onset Date/Surgical Date 11/04/20    Hand Dominance Right      Precautions   Precautions Fall      Balance Screen   Has the patient fallen in the past 6 months No  Home  Environment   Family/patient expects to be discharged to: Private residence    Living Arrangements Spouse/significant other   + cats + 3 kids   Available Help at Discharge Family    Type of Home House    Home Layout Able to live on main level with bedroom/bathroom    Bathroom Shower/Tub Tub/Shower unit    Bathroom Toilet --   BSC over toilet currently   Bathroom Accessibility Yes    Home Equipment Tub bench;Bedside commode;Cane - single point;Walker - 2 wheels    Lives With Family      Prior Function   Level of Independence Independent    Vocation Full time employment    Vocation Requirements tow truck driver    Leisure fishing,  hunting      ADL   Eating/Feeding Modified independent    Grooming Minimal assistance   spouse helping with shaving   Upper Body Bathing Maximal assistance    Lower Body Bathing Maximal assistance    Upper Body Dressing Minimal assistance    Lower Body Dressing Moderate assistance    Toilet Transfer Supervision/safety    Toileting - Solicitor -  Engineer, production      IADL   Prior Level of Function Shopping independent    Shopping Completely unable to shop    Prior Level of Function Light Housekeeping was not completing home management other than yardwork prior    Light Housekeeping Needs help with all home maintenance tasks    Prior Level of Function Meal Prep grilling and cooking breakfasts    Meal Prep Able to complete simple cold meal and snack prep    Prior Level of Function Community Mobility driving    Community Mobility Relies on family or friends for transportation    Prior Level of Function Medication Managment independent    Medication Management Is not capable of dispensing or managing own medication    Prior Level of Function Financial Management did with spouse together    Financial Management Dependent      Written Expression   Dominant Hand Right    Handwriting --   d/t aphasia diff to write     Vision - History   Baseline Vision Wears glasses only for reading   did not wear all the time prior - now wears all the time   Additional Comments pt reports "no" vision in right eye and limited vision in left eye. Difficult to fully assess d/t aphasia. Continue to assess in functional context.      Vision Assessment   Comment continue to assess in functional context      Cognition   Overall Cognitive Status Cognition to be further assessed in functional context PRN    Cognition Comments significant aphasia      Observation/Other  Assessments   Focus on Therapeutic Outcomes (FOTO)  did not capture at front desk during evaluation      Posture/Postural Control   Posture/Postural Control Postural limitations    Postural Limitations Rounded Shoulders;Forward head      Sensation   Light Touch Appears Intact    Proprioception Appears Intact      Coordination   Gross Motor Movements are Fluid and Coordinated No    Fine Motor Movements are Fluid and Coordinated No    9 Hole Peg Test Right;Left    Right 9  Hole Peg Test 58.56s    Left 9 Hole Peg Test 51.47s      Perception   Perception Impaired    Inattention/Neglect Does not attend to left visual field      Praxis   Praxis Impaired    Praxis Impairment Details Motor planning    Praxis-Other Comments Hand over hand for following motor commands      ROM / Strength   AROM / PROM / Strength AROM;Strength      AROM   Overall AROM  Within functional limits for tasks performed      Strength   Overall Strength Unable to assess;Due to impaired cognition    Overall Strength Comments d/t global aphasia, difficulty to fully assess      Hand Function   Right Hand Gross Grasp Functional    Right Hand Grip (lbs) 73.8    Left Hand Gross Grasp Functional    Left Hand Grip (lbs) 61.9                                OT Short Term Goals - 01/05/21 1124       OT SHORT TERM GOAL #1   Title Pt will be independent with HEP targeting coordination and LUE grip and proximal strength    Baseline not issued at eval    Time 4    Period Weeks    Status New    Target Date 02/02/21      OT SHORT TERM GOAL #2   Title Pt and caregiver will verbalize understanding of visual strategies for increasing independence with ADLs and IADLs.    Baseline not reviewed at eval    Time 4    Period Weeks    Status New      OT SHORT TERM GOAL #3   Title Pt will report shaving self with good safety awareness and with distant supervision only.    Baseline spouse  currently shaving/cutting hair    Time 4    Period Weeks    Status New      OT SHORT TERM GOAL #4   Title Pt will perform simple warm meal prep with supervision and good safety awareness (i.e. scrambled eggs, grilled cheese)    Baseline not currently performing    Time 4    Period Weeks    Status New               OT Long Term Goals - 01/05/21 1128       OT LONG TERM GOAL #1   Title Pt will be independent with any updated HEPs    Baseline not issued at eval    Time 10    Period Weeks    Status New    Target Date 03/16/21      OT LONG TERM GOAL #2   Title Pt will perform 9 hole peg test in 52 seconds or less with BUE for increasing fine motor coordination    Baseline L 51.47s, R 58.56s    Time 10    Period Weeks    Status New      OT LONG TERM GOAL #3   Title Pt will improve grip strength in LUE to 66 lbs or greater.    Baseline L 61.9, R 73.8    Time 10    Period Weeks    Status New      OT LONG TERM GOAL #4  Title Pt will perform simple warm meal prep and/or light home management task with mod I and good safety awareness    Baseline not completing    Time 10    Period Weeks    Status New      OT LONG TERM GOAL #5   Title Pt will perform environmental scanning with 90% accuracy    Baseline did not assess at eval, left inattention    Time 10    Period Weeks    Status New               Managed medicaid CPT codes: 40981- Therapeutic Exercise, [phone]- Neuro Re-education, 97140 - Manual Therapy, 97530 - Therapeutic Activities, 97535 - Self Care, 97014 - Electrical stimulation (unattended), [id number] - Electrical stimulation (Manual), [id number] - Orthotic Fit, and [id number] - Fluidotherapy     Plan - 01/05/21 1012     Clinical Impression Statement Pt is a 45 year old male that presents to Neuro OPOT s/p R MCA CVA. Pt was admitted 11/04/20 with acute onset aphasia, blurry vision. CTA revealed acute aortic dissection. Pt with worsening o2 requirements requiring  intubation 7/15, self extubated 7/16. 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  PMH: Anxiety, Depression, Essential hypertension. Pt presents today with residual LUE weakness, apraxia, decreased LUE coordination, global aphasia, unsteadiness on feet and visual deficits to be further assessed. Skilled occupational therapy is recommened to target listed areas of deficits and increase independence and decrease caregiver burden.    OT Occupational Profile and History Problem Focused Assessment - Including review of records relating to presenting problem    Occupational performance deficits (Please refer to evaluation for details): ADL's;IADL's;Work;Leisure    Body Structure / Function / Physical Skills FMC;ADL;Decreased knowledge of use of DME;Strength;GMC;Dexterity;UE functional use;IADL;Vision;Mobility    Cognitive Skills Understand;Attention    Rehab Potential Good    Clinical Decision Making Limited treatment options, no task modification necessary    Comorbidities Affecting Occupational Performance: None    Modification or Assistance to Complete Evaluation  No modification of tasks or assist necessary to complete eval    OT Frequency 1x / week    OT Duration 8 weeks   6 visits over 8 weeks   OT Treatment/Interventions Self-care/ADL training;DME and/or AE instruction;Therapeutic activities;Therapeutic exercise;Cognitive remediation/compensation;Patient/family education;Visual/perceptual remediation/compensation;Passive range of motion;Neuromuscular education;Energy conservation    Plan continue to assess vision and oculomotor (bilateral), coordination HEP BUE, standing balance    Consulted and Agree with Plan of Care Family member/caregiver;Patient    Family Member Consulted Gina, spouse             Patient will benefit from skilled therapeutic intervention in order to improve the  following deficits and impairments:   Body Structure / Function / Physical Skills: [facility], ADL, Decreased knowledge of use of DME, Strength, GMC, Dexterity, UE functional use, IADL, Vision, Mobility Cognitive Skills: Understand, Attention     Visit Diagnosis: Hemiplegia and hemiparesis following cerebral infarction affecting left non-dominant side (HCC) - Plan: Ot plan of care cert/re-cert  Apraxia - Plan: Ot plan of care cert/re-cert  Attention and concentration deficit - Plan: Ot plan of care cert/re-cert  Visuospatial deficit - Plan: Ot plan of care cert/re-cert  Muscle weakness (generalized) - Plan: Ot plan of care cert/re-cert  Other lack of coordination - Plan: Ot plan of care cert/re-cert  Unsteadiness on feet - Plan: Ot plan of care cert/re-cert    Problem  List Patient Active Problem List   Diagnosis Date Noted   AKI (acute kidney injury) (HCC)    Benign essential HTN    Hyponatremia    Acute blood loss anemia    Urinary retention    Pressure injury of skin 11/17/2020   Respiratory arrest (HCC) 11/14/2020   Acute respiratory failure with hypoxia (HCC) 11/14/2020   Adverse effects of medication 11/14/2020   Hypotension 11/14/2020   Encounter for central line placement    Aortic dissection (HCC) 11/13/2020   Acute ischemic right MCA stroke (HCC) 11/13/2020   Agitation 11/13/2020   Atelectasis    Phlebitis    Polysubstance abuse (HCC)    Elevated troponin 11/07/2020   New onset left bundle branch block (LBBB) 11/07/2020   Protein-calorie malnutrition, severe 11/07/2020   Encephalopathy acute    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)    Acute hypoxemic respiratory failure (HCC)    Acute thoracic aortic dissection (HCC) 11/05/2020   S/P aortic dissection repair 11/05/2020   Cerebral embolism with cerebral infarction 11/05/2020   Essential hypertension     Junious Dresser, OT/L 01/06/2021, 9:28 AM  [facility] [address] [place], [place], [address] Phone: [phone]   Fax:  [phone]  Name: Jesse [name] MRN: [id number] Date of Birth: 1975/09/17

## 2021-01-06 ENCOUNTER — Other Ambulatory Visit: Payer: Self-pay

## 2021-01-06 NOTE — Addendum Note (Signed)
Addended by: Junious Dresser on: 01/06/2021 09:32 AM   Modules accepted: Orders

## 2021-01-06 NOTE — Addendum Note (Signed)
Addended by: Gean Maidens on: 01/06/2021 07:07 AM   Modules accepted: Orders

## 2021-01-06 NOTE — Therapy (Addendum)
[facility] [address] [place], [place], [address] Phone: [phone]   Fax:  [phone]  Speech Language Pathology Evaluation  Patient Details  Name: Jesse [name] MRN: [id number] Date of Birth: April 02, 1976 Referring Provider (SLP): Jesse Dollar, PA-C   Encounter Date: 01/05/2021   End of Session - 01/05/21 2349     Visit Number 1    Number of Visits 17    Date for SLP Re-Evaluation 04/05/21    SLP Start Time 1151    SLP Stop Time  1232    SLP Time Calculation (min) 41 min    Activity Tolerance Patient tolerated treatment well             Past Medical History:  Diagnosis Date   Anxiety    Depression    Essential hypertension    S/P aortic dissection repair 11/05/2020   Straight graft replacement of ascending aorta and proximal transverse aortic arch with re-suspension of native aortic valve and open hemi arch distal anastomosis with aorta to right common carotid bypass and aorta to right subclavian bypass    Past Surgical History:  Procedure Laterality Date   ASCENDING AORTIC ROOT REPLACEMENT N/A 11/04/2020   Procedure: 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 WITH REPLACEMENT OF ASCENDING AORTA AND HEMIARCH USING HEMASHIELD PLATINUM GRAFT AND HEMASHIELD GOLD 14 X GRAFT, RESUSPENSION OF NATIVE VALVE, AORTA TO RIGHT CAROTID BYPASS, AORTA TO RIGHT SUBCLAVIAN BYPASS;  Surgeon: Purcell Nails, MD;  Location: [facility] OR;  Service: Open Heart Surgery;  Laterality: N/A;   TEE WITHOUT CARDIOVERSION N/A 11/04/2020   Procedure: TRANSESOPHAGEAL ECHOCARDIOGRAM (TEE);  Surgeon: Purcell Nails, MD;  Location[facility] OR;  Service: Open Heart Surgery;  Laterality: N/A;    There were no vitals filed for this visit.   Subjective Assessment - 01/05/21 1727     Subjective "Oh OK. It's Jesse [name]." (pt, re: SLP "Who is this?" - Jesse [name] is pt's dtr, pt came with wife- Jesse [name])    Patient is accompained by: Family  member   wife Jesse [name]   Currently in Pain? No/denies                SLP Evaluation [facility] - 01/05/21 1729       SLP Visit Information   SLP Received On 01/05/21    Referring Provider (SLP) Jesse Dollar, PA-C    Onset Date 11-04-20    Medical Diagnosis Rt MCA/PCA CVA      Subjective   Patient/Family Stated Goal Improve verbal communication      General Information   HPI Pt with admission via ED on 11-04-20 for diffiuculty speaking. MRI revealed mod large evolving acute ischemic nonhemmorhagic rt MCA and MCA/PCA watershed infarct. Pt with ST on acute and CIR. Discharged 12-11-20 and 2 HHST visits. Pt is Healthy [employer].      Balance Screen   Has the patient fallen in the past 6 months No      Prior Functional Status   Cognitive/Linguistic Baseline Within functional limits    Type of Home House     Lives With Spouse    Vocation Unemployed      Cognition   Overall Cognitive Status Difficult to assess    Difficult to assess due to Impaired communication   appears WNL however, even given the challenge to fully assess cognitive ability     Auditory Comprehension   Overall Auditory Comprehension Impaired    Yes/No Questions Impaired    Basic  Biographical Questions 76-100% accurate   100% with SLP slowed speech rate and extra time provided   Basic Immediate Environment Questions 75-100% accurate   6/6 environmental orientation   Complex Questions 75-100% accurate   74% Quick Aphasia Battery (QAB)- 75% of pt's correct answers were provided 3-6 seconds after stimulus   Other Conversation Comments Pt looked to wife to answer initial three questions asked y SLP, with a WNL speech rate. Overall moderately-severely impaired. Better in context - slowed speech rate and repeats proved helpful for pt for more simple SLP messages.    Interfering Components Processing speed    EffectiveTechniques Extra processing time;Visual/Gestural cues;Slowed speech;Pausing   SLP highlighted to wife to  slow rate and provide gestures for pt, suggested writing key single words may assist pt as well.     Reading Comprehension   Reading Status --   due to time constraints - single syllalble words appear WNL/near-WNL on QAB     Expression   Primary Mode of Expression Other (comment)   mix of gestures, occasional content word     Verbal Expression   Overall Verbal Expression Impaired    Automatic Speech Name;Counting   some mild pausing   Level of Generative/Spontaneous Verbalization Word    Repetition Impaired    Level of Impairment Word level    Naming Impairment    Confrontation 0-24% accurate   16% (1/6)   Other Naming Comments Pt named "dog"; others were neologism. Pt named his wife his daughter's name. .    Effective Techniques Semantic cues    Other Verbal Expression Comments Pt's naming and repetition were 80% neologism (word level) or word salad (phrase/sentence level). In conversation pt largely had empty speech - he was able to communicate 3-4 ideas during the evaluation with questioning cues and assistance from wife to help SLP understand pt's message. Currently, pt and wife are communicating "because we've been together for so long, I know what he wants." Pt is using some gestures which aids Jesse [name]. Often times Jesse [name] will ask pt people's names if she suspects his message is about someone, which provides her with a starting point      Motor Speech   Overall Motor Speech Impaired    Motor Planning Impaired   oral non-verbal apraxia existed with oral tasks   Level of Impairment Word    Motor Speech Errors Inconsistent;Aware      Standardized Assessments   Standardized Assessments  Other Assessment   Quick Aphasia Battery   Other Assessment QAB overall score 2.14=severe aphasia                             SLP Education - 01/05/21 2347     Education Details eval results, homework (family names accurately)    Person(s) Educated Patient;Spouse    Methods  Explanation    Comprehension Verbalized understanding;Need further instruction              SLP Short Term Goals - 01/05/21 2357       SLP SHORT TERM GOAL #1   Title pt will functionally name 7 members of his family/close friends correctly with modified independence in 3 session    Baseline 1    Time 6    Period Weeks    Status New    Target Date 02/13/21      SLP SHORT TERM GOAL #2   Title pt will answer high interest mod complex yes/no  questions about familiar topics with 80% success in 3 sessions    Baseline simple and min-mod complex questions 75%    Time 6    Period Weeks    Status New    Target Date 02/13/21      SLP SHORT TERM GOAL #3   Title pt will demo understanding of simple requests in a timely manner in 3 sessions    Baseline extra time consistently necessary    Time 6    Period Weeks    Status New    Target Date 02/13/21      SLP SHORT TERM GOAL #4   Title pt would demonstrate functional understanding in role-play situation/s for 80% success in 3 sessions    Baseline SLP needed to repeat and slow rate of speech to give pt success    Time 6    Period Weeks    Status New      SLP SHORT TERM GOAL #5   Title pt will demo functional articulation (no neologism) in 3 simple conversational turns in 2 sessions    Baseline marked neologism in conversation    Time 6    Period Weeks    Status New    Target Date 02/13/21              SLP Long Term Goals - 01/06/21 0004       SLP LONG TERM GOAL #1   Title pt will demo understanding of verbal errors by restart or revision 80% of appropriate times, with modified independence, in 3 session    Time 12    Period Weeks    Status New    Target Date 04/06/21      SLP LONG TERM GOAL #2   Title pt will functionally communicate in 5 minutes simple conversation using compensations in 2 sessions    Baseline did not do so    Time 12    Period Weeks    Status New    Target Date 04/06/21      SLP LONG TERM  GOAL #3   Title wife will demo understanding how best to cue pt for auditory comprehension in 3 sessions    Baseline not demonstrated    Time 12    Period Weeks    Status New    Target Date 04/05/21      SLP LONG TERM GOAL #4   Title wife will demo understanding how best to cue pt for verbal expression in 3 sessions    Baseline not demonstrated    Time 12    Period Weeks    Status New    Target Date 04/05/21              Plan - 01/05/21 2350     Clinical Impression Statement Jesse [name] ("Jesse [name]") presents today with, as assessed with Quick Aphasia Battery (QAB), severe aphasia (likely Wernicke's type) due to frequent usage of neologism and word salad with little awareness demonstrated in conversation. Pt demo'd more awareness at the word level. Pt will benefit from skilled ST targeting improved receptive and expressive language.   Speech Therapy Frequency 2x / week    Duration 12 weeks   or until Medicaid visits are exhausted   Treatment/Interventions Compensatory techniques;Functional tasks;Multimodal communcation approach;Cueing hierarchy;SLP instruction and feedback;Language facilitation;Cognitive reorganization;Compensatory strategies;Internal/external aids;Patient/family education    Potential to Achieve Goals Fair    Potential Considerations Severity of impairments;Financial resources    [employer] and Agree with Plan of Care Patient  Patient will benefit from skilled therapeutic intervention in order to improve the following deficits and impairments:   Aphasia  Managed medicaid CPT codes: 55732 - SLP treatment   Problem List Patient Active Problem List   Diagnosis Date Noted   AKI (acute kidney injury) (HCC)    Benign essential HTN    Hyponatremia    Acute blood loss anemia    Urinary retention    Pressure injury of skin 11/17/2020   Respiratory arrest (HCC) 11/14/2020   Acute respiratory failure with hypoxia (HCC) 11/14/2020   Adverse effects  of medication 11/14/2020   Hypotension 11/14/2020   Encounter for central line placement    Aortic dissection (HCC) 11/13/2020   Acute ischemic right MCA stroke (HCC) 11/13/2020   Agitation 11/13/2020   Atelectasis    Phlebitis    Polysubstance abuse (HCC)    Elevated troponin 11/07/2020   New onset left bundle branch block (LBBB) 11/07/2020   Protein-calorie malnutrition, severe 11/07/2020   Encephalopathy acute    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)    Acute hypoxemic respiratory failure (HCC)    Acute thoracic aortic dissection (HCC) 11/05/2020   S/P aortic dissection repair 11/05/2020   Cerebral embolism with cerebral infarction 11/05/2020   Essential hypertension     Jesse [name] ,MS, CCC-SLP  01/06/2021, 12:18 AM  [facility] [address] [place], [place], [address] Phone: [phone]   Fax:  [phone]  Name: Jesse [name] MRN: [id number] Date of Birth: [birth year]

## 2021-01-07 ENCOUNTER — Encounter: Payer: Self-pay | Admitting: Family Medicine

## 2021-01-07 ENCOUNTER — Ambulatory Visit (INDEPENDENT_AMBULATORY_CARE_PROVIDER_SITE_OTHER): Payer: Medicaid Other | Admitting: Family Medicine

## 2021-01-07 VITALS — BP 105/75 | Temp 98.0°F | Resp 16 | Ht 75.0 in | Wt 131.8 lb

## 2021-01-07 DIAGNOSIS — Z8673 Personal history of transient ischemic attack (TIA), and cerebral infarction without residual deficits: Secondary | ICD-10-CM | POA: Diagnosis not present

## 2021-01-07 DIAGNOSIS — G4709 Other insomnia: Secondary | ICD-10-CM

## 2021-01-07 DIAGNOSIS — Z7689 Persons encountering health services in other specified circumstances: Secondary | ICD-10-CM

## 2021-01-07 DIAGNOSIS — Z9889 Other specified postprocedural states: Secondary | ICD-10-CM

## 2021-01-07 DIAGNOSIS — F329 Major depressive disorder, single episode, unspecified: Secondary | ICD-10-CM | POA: Diagnosis not present

## 2021-01-07 MED ORDER — MIRTAZAPINE 7.5 MG PO TABS
7.5000 mg | ORAL_TABLET | Freq: Every day | ORAL | 0 refills | Status: DC
Start: 2021-01-07 — End: 2021-06-01

## 2021-01-07 NOTE — Progress Notes (Signed)
New patient /hospital follow-up  Patient is anxious about getting back to his normal self. Wife said patient vis angry most days because he can't now  There is an area on his upper chest that is bothering patient were his scar is

## 2021-01-07 NOTE — Progress Notes (Signed)
[address]       [place] [address]             [phone]        Jesse [name] is a 45 y.o. male patient who underwent an emergent aortic dissection repair with Dr. Cornelius [name] on 11/05/2020. He had a complicated hospital course which involved getting neurology involved since he originally presented with aphasia and blurry vision. CT scan of the head was obtained that showed a large area of acute infarct in the territory of the middle cerebral artery and posterior cerebral arteries. On 7/15, pt had severe agitated delirium and worsening oxygen requirements, requiring heated high flow nasal cannula and a non-rebreather simultaneously. He was unable to sustain adequate oxygen saturation and he was re-intubated and sedated. On 7/16, patient was extubated to high flow nasal cannula.   On 7/19, pt found to have SVT in L antecubital fossa. Was started on keflex, vancomycin, and a heparin drip. ID was consulted. Blood cultures showed no growth, soft tissue ultrasound showed no abscess, so antibiotics were discontinued per ID recs. On 7/20 a rapid response was called for anisicoria. Head CT showed mild petechial hemorrhage with 1 extra mm of shift. No herniation. Neuro exam remained unchanged. Neurology was consulted and no change in management resulted. Throughout admission in the ICU, pt has had persistent hyperactive delirium. Was initially on precedex, but was weaned and stopped on 7/18.  Patient was discharged to [name] for further rehabilitation as his mobility is declining and a more scheduled rehabilitation routine may improve his delirium.   He was seen for his routine 4 week follow-up visit.  Overall has been doing well from a cardiothoracic standpoint.  He had multiple issues going on at this time and I made a few medication changes. I also referred him to Cardiology and his PCP for some of his concerns. He was in the ED for a UTI from his foley catheter. He was treated with 1 week of  Keflex.  Today, he still has several concerns. From a surgical stand point he looks very good. He has a sternal wire that I can feel at the top of his incision but the patient prefers to see if it will resolve on its own and would prefer no more surgery.    1. S/P aortic dissection repair    Past Medical History:  Diagnosis Date   Anxiety    Depression    Essential hypertension    S/P aortic dissection repair 11/05/2020   Straight graft replacement of ascending aorta and proximal transverse aortic arch with re-suspension of native aortic valve and open hemi arch distal anastomosis with aorta to right common carotid bypass and aorta to right subclavian bypass   No past surgical history pertinent negatives on file. Scheduled Meds:.  Current Outpatient Medications on File Prior to Visit  Medication Sig Dispense Refill   acetaminophen (TYLENOL) 325 MG tablet Take 1-2 tablets (325-650 mg total) by mouth every 4 (four) hours as needed for mild pain.     amLODipine (NORVASC) 5 MG tablet Take 2.5 mg by mouth daily.     aspirin 81 MG chewable tablet Chew 1 tablet (81 mg total) by mouth daily.     carvedilol (COREG) 3.125 MG tablet Take 1 tablet (3.125 mg total) by mouth 2 (two) times daily with a meal. 60 tablet 0   ferrous sulfate 325 (65 FE) MG tablet Take 1 tablet (325 mg total) by mouth  daily. 30 tablet 3   mirtazapine (REMERON) 7.5 MG tablet Take 1 tablet (7.5 mg total) by mouth at bedtime. 30 tablet 0   oxyCODONE (ROXICODONE) 5 MG/5ML solution Take 5-10 mLs (5-10 mg total) by mouth every 4 (four) hours as needed for severe pain. 473 mL 0   pantoprazole sodium (PROTONIX) 40 mg/20 mL PACK Dissolve 1 packet in 20 mLs (40 mg total) and take by mouth daily. 30 mL 0   polyethylene glycol (MIRALAX / GLYCOLAX) 17 g packet Take 17 g by mouth daily as needed for moderate constipation. 14 each 0   QUEtiapine (SEROQUEL) 50 MG tablet Take 1 tablet (50 mg total) by mouth at bedtime. And 1/2 tablet during  the day as needed for agitation 45 tablet 0   rosuvastatin (CRESTOR) 20 MG tablet Take 1 tablet (20 mg total) by mouth daily. 30 tablet 11   tamsulosin (FLOMAX) 0.4 MG CAPS capsule Take 2 capsules (0.8 mg total) by mouth daily after supper. 60 capsule 0   Zinc Sulfate 220 (50 Zn) MG TABS Take 1 tablet (220 mg total) by mouth daily. 30 tablet 0   No current facility-administered medications on file prior to visit.    No Known Allergies Active Problems:   * No active hospital problems. *   Cor: Regular rate and rhythm, no murmur Pulmonary: Clear to auscultation bilaterally and in all fields Abdomen: No tenderness Wound: Sternotomy incision healing well with no drainage Extremities: No edema however he does have bilateral foot pain, primarily the bottom of the foot.  He also has left big toe pain.  Extremities are warm and well-perfused.   Subjective  Objective:  Vitals:   01/08/21 1427  BP: 140/90  Pulse: 82  Resp: 20  SpO2: 96%    Cor: RRR, no murmur Pum: CTA bilaterally and in all fields Abd: + bowel sounds, no tenderness Wound: there is a sternal wire poking out at the superior portion of his incision Ext: numbness, tingling in his left toes and bilateral upper extremity, no edema  Assessment & Plan  Doing well s/p emergency aortic dissection repair. He is struggling more with the rehab from his stroke. He has follow-up with Neuro, PT/OT/SLP Orthostatic hypotension- medications further adjusted in his Cardiologist's office. BP is much better today. I suggested taking his time with transitions from sitting to standing. Encouraged more water intake.  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  He had one lipoma on his forearm before surgery but it seems that they have multiplied.  They do not seem to bother him.  They are not sore or irritated.  I have mentioned to bring this up with his primary care for further workup.  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 Extremities are warm, well-perfused.  Sore throat-resolved Marfan like characteristics-he is under going a genetic workup through cardiology. The patient has a 27 year old son with similar features and may need preventive intervention.  Insomnia-His PCP prescribed him Remeron and Seroquel. There is a drug reaction which involves cardiac arrhythmia. I recommended the family reach out to his PCP before taking these medications together.   Plan: From a cardiothoracic standpoint the patient is doing very well. He needs more complete workup with primary care to work through some of these other issues. He does not need any formal follow-up with our office at this time. I will send a message to Dr. Andrey Campanile summarizing our visit.    Sharlene Dory  12/22/2020  

## 2021-01-07 NOTE — Progress Notes (Signed)
New Patient Office Visit  Subjective:  Patient ID: Jesse [name], male    DOB: 04-07-1976  Age: 45 y.o. MRN: [id number]  CC:  Chief Complaint  Patient presents with   Establish Care   Follow-up    Hospital    HPI Jesse [name] presents for to establish care.  Patient was recently hospitalized for aortic dissection which led to CVA.  Dissection was repaired and patient was subsequently sent to rehab for further management.  Patient has been discharged and continues to have improvement of residual weakness.  His weakness was primarily right-sided.  Patient also reports some mood changes secondary to his present medical situation.   Past Medical History:  Diagnosis Date   Anxiety    Depression    Essential hypertension    Myocardial infarction [facility])    S/P aortic dissection repair 11/05/2020   Straight graft replacement of ascending aorta and proximal transverse aortic arch with re-suspension of native aortic valve and open hemi arch distal anastomosis with aorta to right common carotid bypass and aorta to right subclavian bypass   Stroke [facility])     Past Surgical History:  Procedure Laterality Date   ASCENDING AORTIC ROOT REPLACEMENT N/A 11/04/2020   Procedure: 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 WITH REPLACEMENT OF ASCENDING AORTA AND HEMIARCH USING HEMASHIELD PLATINUM GRAFT AND HEMASHIELD GOLD 14 X GRAFT, RESUSPENSION OF NATIVE VALVE, AORTA TO RIGHT CAROTID BYPASS, AORTA TO RIGHT SUBCLAVIAN BYPASS;  Surgeon: Purcell Nails, MD;  Location: [facility] OR;  Service: Open Heart Surgery;  Laterality: N/A;   TEE WITHOUT CARDIOVERSION N/A 11/04/2020   Procedure: TRANSESOPHAGEAL ECHOCARDIOGRAM (TEE);  Surgeon: Purcell Nails, MD;  Location: [facility] OR;  Service: Open Heart Surgery;  Laterality: N/A;    History reviewed. No pertinent family history.  Social History   Socioeconomic History   Marital status: Married    Spouse name: Not on file   Number of children: Not on file    Years of education: Not on file   Highest education level: 9th grade  Occupational History   Not on file  Tobacco Use   Smoking status: Former    Types: Cigarettes    Quit date: 11/04/2020    Years since quitting: 0.1   Smokeless tobacco: Never  Vaping Use   Vaping Use: Never used  Substance and Sexual Activity   Alcohol use: Yes    Alcohol/week: 2.0 standard drinks    Types: 2 Cans of beer per week   Drug use: Not Currently   Sexual activity: Yes  Other Topics Concern   Not on file  Social History Narrative   Not on file   Social Determinants of Health   Financial Resource Strain: Not on file  Food Insecurity: Not on file  Transportation Needs: Not on file  Physical Activity: Not on file  Stress: Not on file  Social Connections: Not on file  Intimate Partner Violence: Not on file    ROS Review of Systems  Neurological:  Positive for weakness and numbness.  Psychiatric/Behavioral:  Positive for sleep disturbance. Negative for self-injury and suicidal ideas. The patient is nervous/anxious.   All other systems reviewed and are negative.  Objective:   Today's Vitals: BP 105/75 (BP Location: Right Arm, Patient Position: Sitting, Cuff Size: Normal)   Temp 98 F (36.7 C) (Oral)   Resp 16   Ht 6\' 3"  (1.905 m)   Wt 131 lb 12.8 oz (59.8 kg)   SpO2 100%  BMI 16.47 kg/m   Physical Exam Vitals and nursing note reviewed.  Constitutional:      General: He is not in acute distress. HENT:     Head: Normocephalic and atraumatic.  Cardiovascular:     Rate and Rhythm: Normal rate and regular rhythm.  Pulmonary:     Effort: Pulmonary effort is normal.     Breath sounds: Normal breath sounds.  Abdominal:     Palpations: Abdomen is soft.     Tenderness: There is no abdominal tenderness.  Neurological:     General: No focal deficit present.     Mental Status: He is alert and oriented to person, place, and time.     Motor: Weakness present.  Psychiatric:        Mood  and Affect: Mood is anxious and depressed. Affect is blunt.        Behavior: Behavior normal.    Assessment & Plan:   1. History of CVA (cerebrovascular accident) Management as per consultant.  2. Reactive depression Referral to behavioral health for further eval and management.  Remeron 7.5 mg p.o. at at bedtime was prescribed.  Will monitor - Ambulatory referral to Psychology  3. S/P aortic dissection repair Management as per consultant.  4. Other insomnia 10.5 mg prescribed as above.  5. Encounter to establish care   Outpatient Encounter Medications as of 01/07/2021  Medication Sig   acetaminophen (TYLENOL) 325 MG tablet Take 1-2 tablets (325-650 mg total) by mouth every 4 (four) hours as needed for mild pain.   amLODipine (NORVASC) 5 MG tablet Take 2.5 mg by mouth daily.   aspirin 81 MG chewable tablet Chew 1 tablet (81 mg total) by mouth daily.   carvedilol (COREG) 3.125 MG tablet Take 1 tablet (3.125 mg total) by mouth 2 (two) times daily with a meal.   ferrous sulfate 325 (65 FE) MG tablet Take 1 tablet (325 mg total) by mouth daily.   oxyCODONE (ROXICODONE) 5 MG/5ML solution Take 5-10 mLs (5-10 mg total) by mouth every 4 (four) hours as needed for severe pain.   pantoprazole sodium (PROTONIX) 40 mg/20 mL PACK Dissolve 1 packet in 20 mLs (40 mg total) and take by mouth daily.   polyethylene glycol (MIRALAX / GLYCOLAX) 17 g packet Take 17 g by mouth daily as needed for moderate constipation.   QUEtiapine (SEROQUEL) 50 MG tablet Take 1 tablet (50 mg total) by mouth at bedtime. And 1/2 tablet during the day as needed for agitation   rosuvastatin (CRESTOR) 20 MG tablet Take 1 tablet (20 mg total) by mouth daily.   tamsulosin (FLOMAX) 0.4 MG CAPS capsule Take 2 capsules (0.8 mg total) by mouth daily after supper.   traZODone (DESYREL) 50 MG tablet Take 0.5-1 tablets (25-50 mg total) by mouth at bedtime as needed for sleep.   Zinc Sulfate 220 (50 Zn) MG TABS Take 1 tablet (220 mg  total) by mouth daily.   No facility-administered encounter medications on file as of 01/07/2021.    Follow-up: No follow-ups on file.   Tommie Raymond, MD

## 2021-01-08 ENCOUNTER — Encounter: Payer: Self-pay | Admitting: Physician Assistant

## 2021-01-08 ENCOUNTER — Other Ambulatory Visit: Payer: Self-pay

## 2021-01-08 ENCOUNTER — Ambulatory Visit (INDEPENDENT_AMBULATORY_CARE_PROVIDER_SITE_OTHER): Payer: Self-pay | Admitting: Physician Assistant

## 2021-01-08 VITALS — BP 140/90 | HR 82 | Resp 20 | Ht 75.0 in | Wt 137.0 lb

## 2021-01-08 DIAGNOSIS — Z9889 Other specified postprocedural states: Secondary | ICD-10-CM

## 2021-01-08 DIAGNOSIS — I7101 Dissection of thoracic aorta: Secondary | ICD-10-CM

## 2021-01-08 DIAGNOSIS — I71019 Dissection of thoracic aorta, unspecified: Secondary | ICD-10-CM

## 2021-01-09 ENCOUNTER — Other Ambulatory Visit (HOSPITAL_BASED_OUTPATIENT_CLINIC_OR_DEPARTMENT_OTHER): Payer: Self-pay

## 2021-01-10 ENCOUNTER — Encounter: Payer: Self-pay | Admitting: Family Medicine

## 2021-01-12 ENCOUNTER — Other Ambulatory Visit: Payer: Self-pay | Admitting: Family Medicine

## 2021-01-12 MED ORDER — ZINC SULFATE 220 (50 ZN) MG PO TABS: 220.0000 mg | ORAL_TABLET | Freq: Every day | ORAL | 0 refills | Status: DC

## 2021-01-12 NOTE — Telephone Encounter (Signed)
Are you refilling this medication for patient?

## 2021-01-14 ENCOUNTER — Encounter: Payer: Self-pay | Admitting: Rehabilitation

## 2021-01-14 ENCOUNTER — Telehealth: Payer: Medicaid Other | Admitting: Physician Assistant

## 2021-01-14 ENCOUNTER — Ambulatory Visit: Payer: Medicaid Other | Admitting: Rehabilitation

## 2021-01-14 ENCOUNTER — Ambulatory Visit: Payer: Medicaid Other | Admitting: Occupational Therapy

## 2021-01-14 ENCOUNTER — Other Ambulatory Visit: Payer: Self-pay | Admitting: Family Medicine

## 2021-01-14 ENCOUNTER — Encounter: Payer: Self-pay | Admitting: Family Medicine

## 2021-01-14 ENCOUNTER — Other Ambulatory Visit: Payer: Self-pay

## 2021-01-14 VITALS — BP 100/65

## 2021-01-14 DIAGNOSIS — R2681 Unsteadiness on feet: Secondary | ICD-10-CM

## 2021-01-14 DIAGNOSIS — R2689 Other abnormalities of gait and mobility: Secondary | ICD-10-CM | POA: Diagnosis not present

## 2021-01-14 DIAGNOSIS — I69354 Hemiplegia and hemiparesis following cerebral infarction affecting left non-dominant side: Secondary | ICD-10-CM

## 2021-01-14 DIAGNOSIS — M6281 Muscle weakness (generalized): Secondary | ICD-10-CM

## 2021-01-14 DIAGNOSIS — R482 Apraxia: Secondary | ICD-10-CM

## 2021-01-14 DIAGNOSIS — N390 Urinary tract infection, site not specified: Secondary | ICD-10-CM

## 2021-01-14 DIAGNOSIS — R278 Other lack of coordination: Secondary | ICD-10-CM

## 2021-01-14 DIAGNOSIS — R41842 Visuospatial deficit: Secondary | ICD-10-CM | POA: Diagnosis not present

## 2021-01-14 DIAGNOSIS — R41840 Attention and concentration deficit: Secondary | ICD-10-CM

## 2021-01-14 DIAGNOSIS — R4701 Aphasia: Secondary | ICD-10-CM | POA: Diagnosis not present

## 2021-01-14 DIAGNOSIS — R293 Abnormal posture: Secondary | ICD-10-CM

## 2021-01-14 MED ORDER — CIPROFLOXACIN HCL 500 MG PO TABS: 500.0000 mg | ORAL_TABLET | Freq: Two times a day (BID) | ORAL | 0 refills | Status: AC

## 2021-01-14 NOTE — Therapy (Signed)
[facility] [address] [place], [place], [address] Phone: [phone]   Fax:  [phone]  Occupational Therapy Treatment  Patient Details  Name: Jesse [name] MRN: [id number] Date of Birth: [birth year] Referring Provider (OT): f/u Ihor Austin, NP   Encounter Date: 01/14/2021   OT End of Session - 01/14/21 1018     Visit Number 2    Number of Visits 7    Date for OT Re-Evaluation 03/03/21   6 visits over 8 weeks   Authorization Type [employer]    Authorization Time Period 27 combined, Auth req'd    OT Start Time 1016    OT Stop Time 1100    OT Time Calculation (min) 44 min    Activity Tolerance Patient tolerated treatment well    Behavior During Therapy [facility] for tasks assessed/performed             Past Medical History:  Diagnosis Date   Anxiety    Depression    Essential hypertension    Myocardial infarction (HCC)    S/P aortic dissection repair 11/05/2020   Straight graft replacement of ascending aorta and proximal transverse aortic arch with re-suspension of native aortic valve and open hemi arch distal anastomosis with aorta to right common carotid bypass and aorta to right subclavian bypass   Stroke [facility])     Past Surgical History:  Procedure Laterality Date   ASCENDING AORTIC ROOT REPLACEMENT N/A 11/04/2020   Procedure: 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 WITH REPLACEMENT OF ASCENDING AORTA AND HEMIARCH USING HEMASHIELD PLATINUM GRAFT AND HEMASHIELD GOLD 14 X GRAFT, RESUSPENSION OF NATIVE VALVE, AORTA TO RIGHT CAROTID BYPASS, AORTA TO RIGHT SUBCLAVIAN BYPASS;  Surgeon: Purcell Nails, MD;  Location: [facility] OR;  Service: Open Heart Surgery;  Laterality: N/A;   TEE WITHOUT CARDIOVERSION N/A 11/04/2020   Procedure: TRANSESOPHAGEAL ECHOCARDIOGRAM (TEE);  Surgeon: Purcell Nails, MD;  Location: [facility] OR;  Service: Open Heart Surgery;  Laterality: N/A;    There were no vitals  filed for this visit.   Subjective Assessment - 01/14/21 1017     Subjective  Pt returns after evaluation for first therapy session. Pt rerports pain in bottom of feet (L>R)    Patient is accompanied by: Family member   spouse, Gina   Pertinent History Anxiety, Depression, Essential hypertension.    Limitations Global Aphasia, Fall    Patient Stated Goals unable to state d/t aphasia    Currently in Pain? Yes    Pain Score 2     Pain Location Foot    Pain Orientation Right;Left    Pain Descriptors / Indicators Aching;Sore    Pain Type Acute pain    Pain Onset 1 to 4 weeks ago    Pain Frequency Constant    Aggravating Factors  touching foot, walking    Pain Relieving Factors nothing                [facility] OT Assessment - 01/14/21 1031       Vision Assessment   Eye Alignment Within Functional Limits    Vision Assessment --   both eyes reactive to light   Ocular Range of Motion Within Functional Limits    Tracking/Visual Pursuits Decreased smoothness of eye movement to Right inferior field    Saccades Within functional limits    Convergence Within functional limits    Visual Fields --   limited inferiorly with R eye   Diplopia Assessment  Only with right gaze   d/t vision deficits with R eye   Comment on gross assesssment, pt restricted with visual field of right eye. Pt unable to see inferiorly. R Eye tracks inferiorly but pt reports no vision. Pt loses tracking to right and left but able to track to find object                              OT Education - 01/14/21 1215     Education Details Coordination HEP BUE safety precautions for limited inferior visual field on gross assessment    Person(s) Educated Patient;Spouse    Methods Explanation;Demonstration;Handout    Comprehension Verbalized understanding;Returned demonstration              OT Short Term Goals - 01/05/21 1124       OT SHORT TERM GOAL #1   Title Pt will be independent with HEP  targeting coordination and LUE grip and proximal strength    Baseline not issued at eval    Time 4    Period Weeks    Status New    Target Date 02/02/21      OT SHORT TERM GOAL #2   Title Pt and caregiver will verbalize understanding of visual strategies for increasing independence with ADLs and IADLs.    Baseline not reviewed at eval    Time 4    Period Weeks    Status New      OT SHORT TERM GOAL #3   Title Pt will report shaving self with good safety awareness and with distant supervision only.    Baseline spouse currently shaving/cutting hair    Time 4    Period Weeks    Status New      OT SHORT TERM GOAL #4   Title Pt will perform simple warm meal prep with supervision and good safety awareness (i.e. scrambled eggs, grilled cheese)    Baseline not currently performing    Time 4    Period Weeks    Status New               OT Long Term Goals - 01/14/21 1038       OT LONG TERM GOAL #1   Title Pt will be independent with any updated HEPs    Baseline not issued at eval    Time 8    Period Weeks    Status New    Target Date 03/11/21      OT LONG TERM GOAL #2   Title Pt will perform 9 hole peg test in 48 seconds or less with BUE for increasing fine motor coordination    Baseline L 51.47s, R 58.56s    Time 8    Period Weeks    Status Revised      OT LONG TERM GOAL #3   Title Pt will improve grip strength in LUE to 66 lbs or greater.    Baseline L 61.9, R 73.8    Time 8    Period Weeks    Status New      OT LONG TERM GOAL #4   Title Pt will perform simple warm meal prep and/or light home management task with mod I and good safety awareness    Baseline not completing    Time 8    Period Weeks    Status New      OT LONG TERM GOAL #5   Title Pt  will perform environmental scanning with 90% accuracy    Baseline did not assess at eval, left inattention    Time 8    Period Weeks    Status New                   Plan - 01/14/21 1214      Clinical Impression Statement Pt and spouse verbalized understanding and agreement of goals set at evaluation. Some revision d/t incorrect numbers in goals but goal remains the same for increased coordination and strength. Vision assessment (gross) completed with deficist noted with R eye field and ability to see in inferior field.    OT Occupational Profile and History Problem Focused Assessment - Including review of records relating to presenting problem    Occupational performance deficits (Please refer to evaluation for details): ADL's;IADL's;Work;Leisure    Body Structure / Function / Physical Skills FMC;ADL;Decreased knowledge of use of DME;Strength;GMC;Dexterity;UE functional use;IADL;Vision;Mobility    Cognitive Skills Understand;Attention    Rehab Potential Good    Clinical Decision Making Limited treatment options, no task modification necessary    Comorbidities Affecting Occupational Performance: None    Modification or Assistance to Complete Evaluation  No modification of tasks or assist necessary to complete eval    OT Frequency 1x / week    OT Duration 8 weeks   6 visits over 8 weeks   OT Treatment/Interventions Self-care/ADL training;DME and/or AE instruction;Therapeutic activities;Therapeutic exercise;Cognitive remediation/compensation;Patient/family education;Visual/perceptual remediation/compensation;Passive range of motion;Neuromuscular education;Energy conservation    Plan standing balance, obstacles in inferior range with ambulation and safety    Consulted and Agree with Plan of Care Family member/caregiver;Patient    Family Member Consulted Gina, spouse             Patient will benefit from skilled therapeutic intervention in order to improve the following deficits and impairments:   Body Structure / Function / Physical Skills: [facility], ADL, Decreased knowledge of use of DME, Strength, GMC, Dexterity, UE functional use, IADL, Vision, Mobility Cognitive Skills: Understand,  Attention     Visit Diagnosis: Unsteadiness on feet  Muscle weakness (generalized)  Other abnormalities of gait and mobility  Hemiplegia and hemiparesis following cerebral infarction affecting left non-dominant side (HCC)  Attention and concentration deficit  Other lack of coordination  Visuospatial deficit  Apraxia    Problem List Patient Active Problem List   Diagnosis Date Noted   AKI (acute kidney injury) (HCC)    Benign essential HTN    Hyponatremia    Acute blood loss anemia    Urinary retention    Pressure injury of skin 11/17/2020   Respiratory arrest (HCC) 11/14/2020   Acute respiratory failure with hypoxia (HCC) 11/14/2020   Adverse effects of medication 11/14/2020   Hypotension 11/14/2020   Encounter for central line placement    Aortic dissection (HCC) 11/13/2020   Acute ischemic right MCA stroke (HCC) 11/13/2020   Agitation 11/13/2020   Atelectasis    Phlebitis    Polysubstance abuse (HCC)    Elevated troponin 11/07/2020   New onset left bundle branch block (LBBB) 11/07/2020   Protein-calorie malnutrition, severe 11/07/2020   Encephalopathy acute    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)    Acute hypoxemic respiratory failure (HCC)    Acute thoracic aortic dissection (HCC) 11/05/2020   S/P aortic dissection repair 11/05/2020   Cerebral embolism with cerebral infarction 11/05/2020   Essential hypertension     Junious Dresser, OT/L 01/14/2021, 2:09 PM  [place]  [facility] [address] [place], [place], [address] Phone: [phone]   Fax:  [phone]  Name: Alexsander Cavins MRN: [id number] Date of Birth: [birth year]

## 2021-01-14 NOTE — Therapy (Signed)
[facility] [address] [place], [place], [address] Phone: [phone]   Fax:  [phone]  Physical Therapy Treatment  Patient Details  Name: Jesse [name] MRN: [id number] Date of Birth: 24-Oct-1975 Referring Provider (PT): Angiulli (follow up with Ihor Austin, NP)   Encounter Date: 01/14/2021   PT End of Session - 01/14/21 1942     Visit Number 2    Number of Visits 7    Date for PT Re-Evaluation 02/20/21    Authorization Type Medicaid [employer]-auth requested    PT Start Time 0933    PT Stop Time 1015    PT Time Calculation (min) 42 min    Equipment Utilized During Treatment Gait belt    Activity Tolerance Patient tolerated treatment well    Behavior During Therapy [place] for tasks assessed/performed             Past Medical History:  Diagnosis Date   Anxiety    Depression    Essential hypertension    Myocardial infarction (HCC)    S/P aortic dissection repair 11/05/2020   Straight graft replacement of ascending aorta and proximal transverse aortic arch with re-suspension of native aortic valve and open hemi arch distal anastomosis with aorta to right common carotid bypass and aorta to right subclavian bypass   Stroke [facility])     Past Surgical History:  Procedure Laterality Date   ASCENDING AORTIC ROOT REPLACEMENT N/A 11/04/2020   Procedure: 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 WITH REPLACEMENT OF ASCENDING AORTA AND HEMIARCH USING HEMASHIELD PLATINUM GRAFT AND HEMASHIELD GOLD 14 X GRAFT, RESUSPENSION OF NATIVE VALVE, AORTA TO RIGHT CAROTID BYPASS, AORTA TO RIGHT SUBCLAVIAN BYPASS;  Surgeon: Purcell Nails, MD;  Location: [facility] OR;  Service: Open Heart Surgery;  Laterality: N/A;   TEE WITHOUT CARDIOVERSION N/A 11/04/2020   Procedure: TRANSESOPHAGEAL ECHOCARDIOGRAM (TEE);  Surgeon: Purcell Nails, MD;  Location: [facility] OR;  Service: Open Heart Surgery;  Laterality: N/A;    Vitals:    01/14/21 1001 01/14/21 1402  BP: (!) 112/94 100/65     Subjective Assessment - 01/14/21 0939     Subjective Pt reports intermittent pain in feet.  They have mentioned it to MD in past. Wife also reports that pts BP continues to fluctuate and be "low" after BP medications.    Patient is accompained by: Family member    Patient Stated Goals Pt's goals for therapy are to "get back to me".  He wants to go fishing, long distances.    Currently in Pain? Yes    Pain Score 2     Pain Location Foot   front 1/3 of foot   Pain Orientation Right;Left    Pain Descriptors / Indicators Sore;Aching   sensitive   Pain Type Acute pain    Pain Onset 1 to 4 weeks ago    Pain Frequency Constant    Aggravating Factors  something touching foot, walking    Pain Relieving Factors nothing                               [facility] Adult PT Treatment/Exercise - 01/14/21 0945       Transfers   Transfers Sit to Stand;Stand to Sit    Sit to Stand 5: Supervision;With upper extremity assist;From chair/3-in-1    Stand to Sit 5: Supervision;With upper extremity assist;To chair/3-in-1      Ambulation/Gait   Ambulation/Gait  Yes    Ambulation/Gait Assistance 5: Supervision    Ambulation/Gait Assistance Details During session into/out of clinic and from mat<>counter top    Ambulation Distance (Feet) 200 Feet    Assistive device None    Gait Pattern Step-through pattern;Decreased arm swing - left;Decreased step length - left;Narrow base of support    Ambulation Surface Level;Indoor      Self-Care   Self-Care Other Self-Care Comments    Other Self-Care Comments  Discussed BP at length with patient and wife during session.  Wife reports that BP is "low" folowing medications in the am and that pt is "sluggish" the rest of the day.  However upon PT checking BP it was initially lower at 100/65 but with activity increased to 114/94.  They have PCP follow up on 10/4 and then see neurology on 10/11, educated  to bring up at next appt.  Also educated on fatigue following CVA as this may be more why he feels sluggish.  Pt and wife verbalized understanding.      Exercises   Exercises Other Exercises    Other Exercises  See pt instruction for medbridge exercises given during session.  Performed all 1 set of 10 and 2 sets of standing balance at counter top.            Access Code: [email or web address] URL: [email or web address] Date: 01/14/2021 Prepared by: Harriet Butte   Exercises Sit to Stand Without Arm Support - 2 x daily - 7 x weekly - 2 sets - 10 reps Squat - 2 x daily - 7 x weekly - 2 sets - 10 reps Standing Single Leg Stance with Counter Support - 2 x daily - 7 x weekly - 2 sets - 3 reps - 15 sec hold Standing Romberg to 3/4 Tandem Stance - 2 x daily - 7 x weekly - 2 sets - 3 reps - 10 sec hold Romberg Stance with Head Nods - 2 x daily - 7 x weekly - 1 sets - 10 reps Romberg Stance with Head Rotation - 2 x daily - 7 x weekly - 1 sets - 10 reps         PT Education - 01/14/21 1941     Education Details See self care, HEP    Person(s) Educated Patient;Spouse    Methods Explanation;Demonstration;Handout;Verbal cues    Comprehension Verbalized understanding;Returned demonstration              PT Short Term Goals - 01/05/21 2135       PT SHORT TERM GOAL #1   Title Pt will perform HEP with family supervision for improved strength, balance, transfers, and gait.  TARGET  01/30/2021    Baseline No current HEP    Time 3    Period Weeks    Status New      PT SHORT TERM GOAL #2   Title Pt will perform sit<>stand transfers, at least 5 reps minimal to no UE support and independently for improved functional strength.    Baseline UE support and supervision for sit<>stand    Time 3    Period Weeks    Status New      PT SHORT TERM GOAL #3   Title Pt will improve TUG score to less than or equal to 15 sec for decreased fall risk.    Baseline 22.84 sec (>13.5 sec indicates  increased fall risk)    Time 3    Period Weeks    Status New  PT SHORT TERM GOAL #4   Title Pt will improve DGI score to at least 14/24 to decrease fall risk.    Baseline 11/24 (scores <19/24 indicate increased fall risk)    Time 3    Period Weeks    Status New      PT SHORT TERM GOAL #5   Title Pt/wife will verbalize understanding of fall prevention in the home environment.    Baseline Pt at fall risk per TUG and DGI scores.    Time 3    Period Weeks    Status New               PT Long Term Goals - 01/05/21 2139       PT LONG TERM GOAL #1   Title Pt will perform progression of HEP with family supervision for improved strength, balance, transfers, and gait.    Baseline no current HEP    Time 6    Period Weeks    Status New      PT LONG TERM GOAL #2   Title Pt will improve gait velocity to at least 2.62 ft/sec for improved gait efficiency and safety.    Baseline 2.23 ft/sec    Time 6    Period Weeks    Status New      PT LONG TERM GOAL #3   Title Pt will improve TUG score to less than or equal to 13.5 sec for decreased fall risk.    Baseline 22.84 sec    Time 6    Period Weeks    Status New      PT LONG TERM GOAL #4   Title Pt will improve DGI score to at least 19/24 to decrease fall risk.    Baseline 11/24    Time 6    Period Weeks    Status New      PT LONG TERM GOAL #5   Title Pt will ambulate at least 1000 ft, indoors and outdoors, no device, independently for improved household and community gait for activities with children and family.    Baseline Currently min guard for 150 ft/indoor distances    Time 6    Period Weeks    Status New                   Plan - 01/14/21 1942     Clinical Impression Statement Skilled session focused on initiation of HEP for BLE strengthening and high level balane focusing on narrowing BOS.  Pt tolerated well.  His BP did elevate during session from 100/65 to 112/94 with activity.  They have 2 upcoming  appts and wife is monitoring BP at home and has journal of BPs.    Personal Factors and Comorbidities Comorbidity 3+    Comorbidities PMH: Anxiety, Depression, Essential hypertension    Examination-Activity Limitations Locomotion Level;Transfers;Sit;Stairs;Stand;Dressing;Toileting;Caring for Others    Examination-Participation Restrictions Occupation;Community Activity;Driving;Interpersonal Relationship    Stability/Clinical Decision Making Unstable/Unpredictable   postural hypotension, low blood pressure   Rehab Potential Good    PT Frequency 1x / week    PT Duration 6 weeks   plus eval   PT Treatment/Interventions ADLs/Self Care Home Management;Electrical Stimulation;DME Instruction;Gait training;Stair training;Functional mobility training;Therapeutic activities;Therapeutic exercise;Balance training;Neuromuscular re-education;Passive range of motion;Patient/family education;Orthotic Fit/Training;Vestibular    PT Next Visit Plan See how HEP is going, add as able for gait with head turns/counter balance exercises.  Need to monitor vitals, as pt has been experiencing variable BP    PT  Home Exercise Plan [email or web address]    Consulted and Agree with Plan of Care Patient;Family member/caregiver    Family Member Consulted wife             Patient will benefit from skilled therapeutic intervention in order to improve the following deficits and impairments:  Abnormal gait, Difficulty walking, Decreased balance, Decreased mobility, Decreased strength, Postural dysfunction  Visit Diagnosis: Unsteadiness on feet  Muscle weakness (generalized)  Other abnormalities of gait and mobility  Abnormal posture     Problem List Patient Active Problem List   Diagnosis Date Noted   AKI (acute kidney injury) (HCC)    Benign essential HTN    Hyponatremia    Acute blood loss anemia    Urinary retention    Pressure injury of skin 11/17/2020   Respiratory arrest (HCC) 11/14/2020   Acute respiratory failure  with hypoxia (HCC) 11/14/2020   Adverse effects of medication 11/14/2020   Hypotension 11/14/2020   Encounter for central line placement    Aortic dissection (HCC) 11/13/2020   Acute ischemic right MCA stroke (HCC) 11/13/2020   Agitation 11/13/2020   Atelectasis    Phlebitis    Polysubstance abuse (HCC)    Elevated troponin 11/07/2020   New onset left bundle branch block (LBBB) 11/07/2020   Protein-calorie malnutrition, severe 11/07/2020   Encephalopathy acute    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 [place], [place], [address] Phone: [phone]   Fax:  [phone] 01/14/21, 7:46 PM   Name: Daivion Pape MRN: [id number] Date of Birth: 12-Dec-1975

## 2021-01-14 NOTE — Patient Instructions (Signed)
Basic Activities:    Use your affected hand to perform the following activities for 20-30 minutes 1-2 times/day.  Stop activity if you experience pain.   - Flip playing cards - Deal top card with thumb - Toss ball - Rotate ball in your hand  - Turn doorknob - Open/close cabinet door with handle - Pick up coins and place in a container - Stack coins (stacks of 5) and manipulate one at a time to fingertips to place in bank/container - Fold towels - Stack blocks - Pick up 1-inch blocks - Put empty clothes hangers on a rack, remove, and repeat    

## 2021-01-14 NOTE — Patient Instructions (Signed)
Access Code: [email or web address] URL: [email or web address] Date: 01/14/2021 Prepared by: Harriet Butte  Exercises Sit to Stand Without Arm Support - 2 x daily - 7 x weekly - 2 sets - 10 reps Squat - 2 x daily - 7 x weekly - 2 sets - 10 reps Standing Single Leg Stance with Counter Support - 2 x daily - 7 x weekly - 2 sets - 3 reps - 15 sec hold Standing Romberg to 3/4 Tandem Stance - 2 x daily - 7 x weekly - 2 sets - 3 reps - 10 sec hold Romberg Stance with Head Nods - 2 x daily - 7 x weekly - 1 sets - 10 reps Romberg Stance with Head Rotation - 2 x daily - 7 x weekly - 1 sets - 10 reps

## 2021-01-14 NOTE — Telephone Encounter (Signed)
Please review

## 2021-01-14 NOTE — Progress Notes (Signed)
E-Visit for Urinary Problems ? ?Based on what you shared with me, I feel your condition warrants further evaluation and I recommend that you be seen for a face to face office visit.  Male bladder infections are not very common.  We worry about prostate or kidney conditions.  The standard of care is to examine the abdomen and kidneys, and to do a urine and blood test to make sure that something more serious is not going on.  We recommend that you see a provider today.  If your doctor's office is closed [place] has the following Urgent Cares: ? ?  ?NOTE: You will not be charged for this e-visit. ? ?If you are having a true medical emergency please call 911.   ? ?  ? For an urgent face to face visit, [place] has six urgent care centers for your convenience:  ?  ? [facility] [place] ?Get Driving Directions ?[phone] ?[address] ?[place], [place] [address] ?  ? [facility] ([place]) ?Get Driving Directions ?[phone] ?[address] ?[place], [place] [address] ? ?[facility] [facility] [place]) ?Get Driving Directions ?[phone] ?[address] ?,  [place]  [address] ? ?[facility] ?Get Driving Directions ?[phone] ?[address] ?[place], [place] [address] ?  ?[facility] [place] ?Get Driving Directions  ?[phone] ?[address] ?[address] ?[place], [place] [address] ?  ?[facility] [place] ?Get Driving Directions ?[phone] ?[address] ?[place], [place] [address] ? ?Your MyChart E-visit questionnaire answers were reviewed by a board certified advanced clinical practitioner to complete your personal care plan based on your specific symptoms.  Thank you for using e-Visits. ?

## 2021-01-16 ENCOUNTER — Other Ambulatory Visit: Payer: Self-pay

## 2021-01-16 ENCOUNTER — Ambulatory Visit: Payer: Medicaid Other

## 2021-01-16 DIAGNOSIS — R4701 Aphasia: Secondary | ICD-10-CM

## 2021-01-16 DIAGNOSIS — R41842 Visuospatial deficit: Secondary | ICD-10-CM | POA: Diagnosis not present

## 2021-01-16 DIAGNOSIS — R482 Apraxia: Secondary | ICD-10-CM | POA: Diagnosis not present

## 2021-01-16 DIAGNOSIS — R2681 Unsteadiness on feet: Secondary | ICD-10-CM | POA: Diagnosis not present

## 2021-01-16 DIAGNOSIS — R293 Abnormal posture: Secondary | ICD-10-CM | POA: Diagnosis not present

## 2021-01-16 DIAGNOSIS — M6281 Muscle weakness (generalized): Secondary | ICD-10-CM | POA: Diagnosis not present

## 2021-01-16 DIAGNOSIS — R2689 Other abnormalities of gait and mobility: Secondary | ICD-10-CM | POA: Diagnosis not present

## 2021-01-16 DIAGNOSIS — I69354 Hemiplegia and hemiparesis following cerebral infarction affecting left non-dominant side: Secondary | ICD-10-CM | POA: Diagnosis not present

## 2021-01-16 DIAGNOSIS — R41840 Attention and concentration deficit: Secondary | ICD-10-CM | POA: Diagnosis not present

## 2021-01-16 DIAGNOSIS — R278 Other lack of coordination: Secondary | ICD-10-CM | POA: Diagnosis not present

## 2021-01-16 NOTE — Patient Instructions (Signed)
Phrases to practice:  I'll be right back.  Go get your sister.   Practice finding objects in your house and try to name them with [name]. Provide same sound hints to help him.

## 2021-01-16 NOTE — Therapy (Signed)
[facility] [address] [place], [place], [address] Phone: [phone]   Fax:  [phone]  Speech Language Pathology Treatment  Patient Details  Name: Jesse [name] MRN: [id number] Date of Birth: 06/14/1975 Referring Provider (SLP): Mariam Dollar, PA-C   Encounter Date: 01/16/2021   End of Session - 01/16/21 1017     Visit Number 2    Number of Visits 17    Date for SLP Re-Evaluation 04/05/21    Authorization Type Medicaid    Authorization - Visit Number 1    Authorization - Number of Visits 9    SLP Start Time 1017    SLP Stop Time  1100    SLP Time Calculation (min) 43 min    Activity Tolerance Patient tolerated treatment well             Past Medical History:  Diagnosis Date   Anxiety    Depression    Essential hypertension    Myocardial infarction (HCC)    S/P aortic dissection repair 11/05/2020   Straight graft replacement of ascending aorta and proximal transverse aortic arch with re-suspension of native aortic valve and open hemi arch distal anastomosis with aorta to right common carotid bypass and aorta to right subclavian bypass   Stroke [facility])     Past Surgical History:  Procedure Laterality Date   ASCENDING AORTIC ROOT REPLACEMENT N/A 11/04/2020   Procedure: 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 WITH REPLACEMENT OF ASCENDING AORTA AND HEMIARCH USING HEMASHIELD PLATINUM GRAFT AND HEMASHIELD GOLD 14 X GRAFT, RESUSPENSION OF NATIVE VALVE, AORTA TO RIGHT CAROTID BYPASS, AORTA TO RIGHT SUBCLAVIAN BYPASS;  Surgeon: Purcell Nails, MD;  Location: [facility] OR;  Service: Open Heart Surgery;  Laterality: N/A;   TEE WITHOUT CARDIOVERSION N/A 11/04/2020   Procedure: TRANSESOPHAGEAL ECHOCARDIOGRAM (TEE);  Surgeon: Purcell Nails, MD;  Location: [facility] OR;  Service: Open Heart Surgery;  Laterality: N/A;    There were no vitals filed for this visit.   Subjective Assessment - 01/16/21 1017      Subjective "I can say some words"    Patient is accompained by: Family member   wife, [name]   Currently in Pain? Yes    Pain Score 2     Pain Location Finger (Comment which one)   tips                  ADULT SLP TREATMENT - 01/16/21 1017       General Information   Behavior/Cognition Alert;Cooperative;Pleasant mood      Treatment Provided   Treatment provided Cognitive-Linquistic      Cognitive-Linquistic Treatment   Treatment focused on Aphasia;Apraxia    Skilled Treatment Pt has been working on verbalizing family member names at home, in which pt able to name 3 children and their ages without need for A. Pt required intermittent mod A to name siblings, with SLP cuing wife to provide first letter/sounds. Pt able to name second twin brother given sentence starter. SLP educated and demonstrated how to facilitate correct phonemic placement of first letters/sounds to aid naming when neologisms and perseveration exhibited, in which pt and wife verbalized understanding. SLP provided recommendations to target functional naming at home.      Assessment / Recommendations / Plan   Plan Continue with current plan of care      Progression Toward Goals   Progression toward goals Progressing toward goals  SLP Education - 01/16/21 1156     Education Details how to aid phoneme placement, functional naming    Person(s) Educated Patient;Spouse    Methods Explanation;Demonstration;Verbal cues;Handout    Comprehension Verbalized understanding;Returned demonstration;Verbal cues required;Need further instruction              SLP Short Term Goals - 01/16/21 1201       SLP SHORT TERM GOAL #1   Title pt will functionally name 7 members of his family/close friends correctly with modified independence in 3 session    Baseline 1    Time 6    Period Weeks    Status On-going    Target Date 02/13/21      SLP SHORT TERM GOAL #2   Title pt will answer high interest mod  complex yes/no questions about familiar topics with 80% success in 3 sessions    Baseline simple and min-mod complex questions 75%    Time 6    Period Weeks    Status On-going    Target Date 02/13/21      SLP SHORT TERM GOAL #3   Title pt will demo understanding of simple requests in a timely manner in 3 sessions    Baseline extra time consistently necessary    Time 6    Period Weeks    Status On-going    Target Date 02/13/21      SLP SHORT TERM GOAL #4   Title pt would demonstrate functional understanding in role-play situation/s for 80% success in 3 sessions    Baseline SLP needed to repeat and slow rate of speech to give pt success    Time 6    Period Weeks    Status On-going      SLP SHORT TERM GOAL #5   Title pt will demo functional articulation (no neologism) in 3 simple conversational turns in 2 sessions    Baseline marked neologism in conversation    Time 6    Period Weeks    Status On-going    Target Date 02/13/21              SLP Long Term Goals - 01/16/21 1202       SLP LONG TERM GOAL #1   Title pt will demo understanding of verbal errors by restart or revision 80% of appropriate times, with modified independence, in 3 session    Time 12    Period Weeks    Status On-going    Target Date 04/05/21      SLP LONG TERM GOAL #2   Title pt will functionally communicate in 5 minutes simple conversation using compensations in 2 sessions    Baseline did not do so    Time 12    Period Weeks    Status On-going    Target Date 04/05/21      SLP LONG TERM GOAL #3   Title wife will demo understanding how best to cue pt for auditory comprehension in 3 sessions    Baseline not demonstrated    Time 12    Period Weeks    Status On-going    Target Date 04/05/21      SLP LONG TERM GOAL #4   Title wife will demo understanding how best to cue pt for verbal expression in 3 sessions    Baseline not demonstrated    Time 12    Period Weeks    Status On-going     Target Date 04/05/21  Plan - 01/16/21 1159     Clinical Impression Statement [facility]") presents today with severe aphasia (likely Wernicke's type) due to frequent usage of neologism and word salad. SLP targeted functional naming to aid naming at home. Pt benefited from usual mod verbal and visual cues to correct articulation errors secondary to neologisms and perseverations. Pt will benefit from skilled ST targeting improved receptive and expressive language.    Speech Therapy Frequency 2x / week    Duration 12 weeks   or until Medicaid visits are exhausted   Treatment/Interventions Compensatory techniques;Functional tasks;Multimodal communcation approach;Cueing hierarchy;SLP instruction and feedback;Language facilitation;Cognitive reorganization;Compensatory strategies;Internal/external aids;Patient/family education    Potential to Achieve Goals Fair    Potential Considerations Severity of impairments;Financial resources    SLP Home Exercise Plan provided    Consulted and Agree with Plan of Care Patient;Family member/caregiver    Family Member Consulted wife             Patient will benefit from skilled therapeutic intervention in order to improve the following deficits and impairments:   Aphasia  Verbal apraxia    Problem List Patient Active Problem List   Diagnosis Date Noted   AKI (acute kidney injury) (HCC)    Benign essential HTN    Hyponatremia    Acute blood loss anemia    Urinary retention    Pressure injury of skin 11/17/2020   Respiratory arrest (HCC) 11/14/2020   Acute respiratory failure with hypoxia (HCC) 11/14/2020   Adverse effects of medication 11/14/2020   Hypotension 11/14/2020   Encounter for central line placement    Aortic dissection (HCC) 11/13/2020   Acute ischemic right MCA stroke (HCC) 11/13/2020   Agitation 11/13/2020   Atelectasis    Phlebitis    Polysubstance abuse (HCC)    Elevated troponin 11/07/2020   New  onset left bundle branch block (LBBB) 11/07/2020   Protein-calorie malnutrition, severe 11/07/2020   Encephalopathy acute    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 [address] [place], [place], [address] Phone: [phone]   Fax:  [phone]   Name: Lavoris Sparling MRN: [id number] Date of Birth: Sep 17, 1975

## 2021-01-19 ENCOUNTER — Other Ambulatory Visit: Payer: Self-pay

## 2021-01-19 ENCOUNTER — Ambulatory Visit: Payer: Medicaid Other | Admitting: Physical Therapy

## 2021-01-19 ENCOUNTER — Ambulatory Visit: Payer: Medicaid Other

## 2021-01-19 ENCOUNTER — Ambulatory Visit: Payer: Medicaid Other | Admitting: Occupational Therapy

## 2021-01-19 VITALS — BP 103/59 | HR 92

## 2021-01-19 DIAGNOSIS — R482 Apraxia: Secondary | ICD-10-CM

## 2021-01-19 DIAGNOSIS — R41840 Attention and concentration deficit: Secondary | ICD-10-CM | POA: Diagnosis not present

## 2021-01-19 DIAGNOSIS — R4701 Aphasia: Secondary | ICD-10-CM

## 2021-01-19 DIAGNOSIS — R41842 Visuospatial deficit: Secondary | ICD-10-CM

## 2021-01-19 DIAGNOSIS — M6281 Muscle weakness (generalized): Secondary | ICD-10-CM

## 2021-01-19 DIAGNOSIS — R2689 Other abnormalities of gait and mobility: Secondary | ICD-10-CM | POA: Diagnosis not present

## 2021-01-19 DIAGNOSIS — I69354 Hemiplegia and hemiparesis following cerebral infarction affecting left non-dominant side: Secondary | ICD-10-CM | POA: Diagnosis not present

## 2021-01-19 DIAGNOSIS — R2681 Unsteadiness on feet: Secondary | ICD-10-CM

## 2021-01-19 DIAGNOSIS — R278 Other lack of coordination: Secondary | ICD-10-CM

## 2021-01-19 DIAGNOSIS — R293 Abnormal posture: Secondary | ICD-10-CM | POA: Diagnosis not present

## 2021-01-19 NOTE — Patient Instructions (Addendum)
Prior to standing, make sure to do some seated exercises:  Seated marching x 10 reps Seated leg kicks x 10 reps Seated ankle pumps-heel/toe raises x 10 reps

## 2021-01-19 NOTE — Therapy (Signed)
[facility] [address] [place], [place], [address] Phone: [phone]   Fax:  [phone]  Occupational Therapy Treatment  Patient Details  Name: Jesse [name] MRN: [id number] Date of Birth: 03-21-1976 Referring Provider (OT): f/u Ihor Austin, NP   Encounter Date: 01/19/2021   OT End of Session - 01/19/21 1022     Visit Number 3    Number of Visits 7    Date for OT Re-Evaluation 03/03/21   6 visits over 8 weeks   Authorization Type [employer]    Authorization Time Period 27 combined, Auth req'd    OT Start Time 0930    OT Stop Time 1015    OT Time Calculation (min) 45 min    Activity Tolerance Patient tolerated treatment well    Behavior During Therapy [name] for tasks assessed/performed             Past Medical History:  Diagnosis Date   Anxiety    Depression    Essential hypertension    Myocardial infarction (HCC)    S/P aortic dissection repair 11/05/2020   Straight graft replacement of ascending aorta and proximal transverse aortic arch with re-suspension of native aortic valve and open hemi arch distal anastomosis with aorta to right common carotid bypass and aorta to right subclavian bypass   Stroke [facility])     Past Surgical History:  Procedure Laterality Date   ASCENDING AORTIC ROOT REPLACEMENT N/A 11/04/2020   Procedure: 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 WITH REPLACEMENT OF ASCENDING AORTA AND HEMIARCH USING HEMASHIELD PLATINUM GRAFT AND HEMASHIELD GOLD 14 X GRAFT, RESUSPENSION OF NATIVE VALVE, AORTA TO RIGHT CAROTID BYPASS, AORTA TO RIGHT SUBCLAVIAN BYPASS;  Surgeon: Purcell Nails, MD;  Location: [facility] OR;  Service: Open Heart Surgery;  Laterality: N/A;   TEE WITHOUT CARDIOVERSION N/A 11/04/2020   Procedure: TRANSESOPHAGEAL ECHOCARDIOGRAM (TEE);  Surgeon: Purcell Nails, MD;  Location: [facility] OR;  Service: Open Heart Surgery;  Laterality: N/A;    There were no vitals  filed for this visit.   Subjective Assessment - 01/19/21 0935     Subjective  Wife reports tingling in fingertips and feet    Patient is accompanied by: Family member   spouse, Gina   Pertinent History Anxiety, Depression, Essential hypertension.    Limitations Global Aphasia, Fall, *foley catheter*    Patient Stated Goals unable to state d/t aphasia    Currently in Pain? Yes   in feet - O.T. not addressing   Pain Onset 1 to 4 weeks ago             Reviewed coordination HEP for bilateral hands. Noted pt would automatically grab items with Lt hand, and required cues to practice on Rt side as well.   Environmental scanning w/ obstacles on floor and clothespins on RT/Lt side - pt missed 2 items on Rt side and required cues to scan RT side, missed 1 item on Lt in busy environment. Pt saw obstacle on floor. Pt also navigated steps carefully using railing and brightly colored tape at edge of each step.   Discussed findings with pt/wife and briefly discussed MRI results. Encouraged pt/wife to discuss further w/ neurologist                       OT Short Term Goals - 01/05/21 1124       OT SHORT TERM GOAL #1   Title Pt will be independent  with HEP targeting coordination and LUE grip and proximal strength    Baseline not issued at eval    Time 4    Period Weeks    Status New    Target Date 02/02/21      OT SHORT TERM GOAL #2   Title Pt and caregiver will verbalize understanding of visual strategies for increasing independence with ADLs and IADLs.    Baseline not reviewed at eval    Time 4    Period Weeks    Status New      OT SHORT TERM GOAL #3   Title Pt will report shaving self with good safety awareness and with distant supervision only.    Baseline spouse currently shaving/cutting hair    Time 4    Period Weeks    Status New      OT SHORT TERM GOAL #4   Title Pt will perform simple warm meal prep with supervision and good safety awareness (i.e.  scrambled eggs, grilled cheese)    Baseline not currently performing    Time 4    Period Weeks    Status New               OT Long Term Goals - 01/14/21 1038       OT LONG TERM GOAL #1   Title Pt will be independent with any updated HEPs    Baseline not issued at eval    Time 8    Period Weeks    Status New    Target Date 03/11/21      OT LONG TERM GOAL #2   Title Pt will perform 9 hole peg test in 48 seconds or less with BUE for increasing fine motor coordination    Baseline L 51.47s, R 58.56s    Time 8    Period Weeks    Status Revised      OT LONG TERM GOAL #3   Title Pt will improve grip strength in LUE to 66 lbs or greater.    Baseline L 61.9, R 73.8    Time 8    Period Weeks    Status New      OT LONG TERM GOAL #4   Title Pt will perform simple warm meal prep and/or light home management task with mod I and good safety awareness    Baseline not completing    Time 8    Period Weeks    Status New      OT LONG TERM GOAL #5   Title Pt will perform environmental scanning with 90% accuracy    Baseline did not assess at eval, left inattention    Time 8    Period Weeks    Status New                   Plan - 01/19/21 1024     Clinical Impression Statement Noted Rt hand and visual deficits as well as Lt. Pt with possible RT inattention and automatically uses Lt hand when reaching (although Lt side of body was initially affected) and requires cues to scan Rt for environmental scanning. MRI reveals RT CVA but also smaller infarcts on Lt side of brain which may cause symptoms seen today    OT Occupational Profile and History Problem Focused Assessment - Including review of records relating to presenting problem    Occupational performance deficits (Please refer to evaluation for details): ADL's;IADL's;Work;Leisure    Body Structure / Function / Physical Skills  FMC;ADL;Decreased knowledge of use of DME;Strength;GMC;Dexterity;UE functional  use;IADL;Vision;Mobility    Cognitive Skills Understand;Attention    Rehab Potential Good    Clinical Decision Making Limited treatment options, no task modification necessary    Comorbidities Affecting Occupational Performance: None    Modification or Assistance to Complete Evaluation  No modification of tasks or assist necessary to complete eval    OT Frequency 1x / week    OT Duration 8 weeks   6 visits over 8 weeks   OT Treatment/Interventions Self-care/ADL training;DME and/or AE instruction;Therapeutic activities;Therapeutic exercise;Cognitive remediation/compensation;Patient/family education;Visual/perceptual remediation/compensation;Passive range of motion;Neuromuscular education;Energy conservation    Plan continue bilateral coordination, tabletop and environmental scanning, Lt grip    Consulted and Agree with Plan of Care Family member/caregiver;Patient    Family Member Consulted Gina, spouse             Patient will benefit from skilled therapeutic intervention in order to improve the following deficits and impairments:   Body Structure / Function / Physical Skills: [facility], ADL, Decreased knowledge of use of DME, Strength, GMC, Dexterity, UE functional use, IADL, Vision, Mobility Cognitive Skills: Understand, Attention     Visit Diagnosis: Visuospatial deficit  Other lack of coordination  Unsteadiness on feet    Problem List Patient Active Problem List   Diagnosis Date Noted   AKI (acute kidney injury) (HCC)    Benign essential HTN    Hyponatremia    Acute blood loss anemia    Urinary retention    Pressure injury of skin 11/17/2020   Respiratory arrest (HCC) 11/14/2020   Acute respiratory failure with hypoxia (HCC) 11/14/2020   Adverse effects of medication 11/14/2020   Hypotension 11/14/2020   Encounter for central line placement    Aortic dissection (HCC) 11/13/2020   Acute ischemic right MCA stroke (HCC) 11/13/2020   Agitation 11/13/2020   Atelectasis     Phlebitis    Polysubstance abuse (HCC)    Elevated troponin 11/07/2020   New onset left bundle branch block (LBBB) 11/07/2020   Protein-calorie malnutrition, severe 11/07/2020   Encephalopathy acute    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 [place], [place], [address] Phone: [phone]   Fax:  [phone]  Name: Jesse [name] MRN: [id number] Date of Birth: January 04, 1976

## 2021-01-19 NOTE — Therapy (Signed)
[facility] [address] [place], [place], [address] Phone: [phone]   Fax:  [phone]  Speech Language Pathology Treatment  Patient Details  Name: Jesse [name] MRN: [id number] Date of Birth: [birth year] Referring Provider (SLP): Mariam Dollar, PA-C   Encounter Date: 01/19/2021   End of Session - 01/19/21 1032     Visit Number 3    Number of Visits 17    Date for SLP Re-Evaluation 04/05/21    Authorization Type Medicaid    Authorization - Visit Number 2    Authorization - Number of Visits 9    SLP Start Time 1100    SLP Stop Time  1145    SLP Time Calculation (min) 45 min    Activity Tolerance Patient tolerated treatment well             Past Medical History:  Diagnosis Date   Anxiety    Depression    Essential hypertension    Myocardial infarction (HCC)    S/P aortic dissection repair 11/05/2020   Straight graft replacement of ascending aorta and proximal transverse aortic arch with re-suspension of native aortic valve and open hemi arch distal anastomosis with aorta to right common carotid bypass and aorta to right subclavian bypass   Stroke [facility])     Past Surgical History:  Procedure Laterality Date   ASCENDING AORTIC ROOT REPLACEMENT N/A 11/04/2020   Procedure: 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 WITH REPLACEMENT OF ASCENDING AORTA AND HEMIARCH USING HEMASHIELD PLATINUM GRAFT AND HEMASHIELD GOLD 14 X GRAFT, RESUSPENSION OF NATIVE VALVE, AORTA TO RIGHT CAROTID BYPASS, AORTA TO RIGHT SUBCLAVIAN BYPASS;  Surgeon: Purcell Nails, MD;  Location: [facility] OR;  Service: Open Heart Surgery;  Laterality: N/A;   TEE WITHOUT CARDIOVERSION N/A 11/04/2020   Procedure: TRANSESOPHAGEAL ECHOCARDIOGRAM (TEE);  Surgeon: Purcell Nails, MD;  Location: [facility] OR;  Service: Open Heart Surgery;  Laterality: N/A;    There were no vitals filed for this visit.   Subjective Assessment - 01/19/21 1054      Subjective "good"    Patient is accompained by: Family member   wife, [name]   Currently in Pain? No/denies                   ADULT SLP TREATMENT - 01/19/21 1032       General Information   Behavior/Cognition Alert;Cooperative;Pleasant mood      Treatment Provided   Treatment provided Cognitive-Linquistic      Cognitive-Linquistic Treatment   Treatment focused on Aphasia;Apraxia    Skilled Treatment Pt verbally named 7 family members names with rare written cue x1. SLP targeted functional naming related to favorite topics, including hobbies and sports. Pt relied on wife to provide answers. SLP provided usual mod A for speech production. Pt benefited from slow rate, visual articulatory cues, and syllable segmentation. Pt's wife reports pt occasionally "writes in the air," in which SLP trialed writing which was ineffective this session. SLP educated patient and wife on anomia compensations such as gestures and drawing to augment verbal expression. Inconsistent ability to gesture exhibited, which may related to apraxia. Pt exhibited good drawing given verbal prompt.      Assessment / Recommendations / Plan   Plan Continue with current plan of care      Progression Toward Goals   Progression toward goals Progressing toward goals              SLP Education - 01/19/21 1147  Education Details draw it, gesture, write    Person(s) Educated Patient;Spouse    Methods Explanation;Demonstration;Verbal cues;Handout    Comprehension Verbalized understanding;Returned demonstration;Verbal cues required;Need further instruction              SLP Short Term Goals - 01/19/21 1033       SLP SHORT TERM GOAL #1   Title pt will functionally name 7 members of his family/close friends correctly with modified independence in 3 session    Baseline 1; 01-19-21    Time 6    Period Weeks    Status On-going    Target Date 02/13/21      SLP SHORT TERM GOAL #2   Title pt will answer  high interest mod complex yes/no questions about familiar topics with 80% success in 3 sessions    Baseline simple and min-mod complex questions 75%    Time 6    Period Weeks    Status On-going    Target Date 02/13/21      SLP SHORT TERM GOAL #3   Title pt will demo understanding of simple requests in a timely manner in 3 sessions    Baseline extra time consistently necessary; 01-19-21    Time 6    Period Weeks    Status On-going    Target Date 02/13/21      SLP SHORT TERM GOAL #4   Title pt would demonstrate functional understanding in role-play situation/s for 80% success in 3 sessions    Baseline SLP needed to repeat and slow rate of speech to give pt success    Time 6    Period Weeks    Status On-going      SLP SHORT TERM GOAL #5   Title pt will demo functional articulation (no neologism) in 3 simple conversational turns in 2 sessions    Baseline marked neologism in conversation    Time 6    Period Weeks    Status On-going    Target Date 02/13/21              SLP Long Term Goals - 01/19/21 1033       SLP LONG TERM GOAL #1   Title pt will demo understanding of verbal errors by restart or revision 80% of appropriate times, with modified independence, in 3 session    Time 12    Period Weeks    Status On-going    Target Date 04/05/21      SLP LONG TERM GOAL #2   Title pt will functionally communicate in 5 minutes simple conversation using compensations in 2 sessions    Baseline did not do so    Time 12    Period Weeks    Status On-going    Target Date 04/05/21      SLP LONG TERM GOAL #3   Title wife will demo understanding how best to cue pt for auditory comprehension in 3 sessions    Baseline not demonstrated    Time 12    Period Weeks    Status On-going    Target Date 04/05/21      SLP LONG TERM GOAL #4   Title wife will demo understanding how best to cue pt for verbal expression in 3 sessions    Baseline not demonstrated    Time 12    Period Weeks     Status On-going    Target Date 04/05/21              Plan -  01/19/21 1032     Clinical Impression Statement Jesse [name] [facility]") presents today with severe aphasia (likely Wernicke's type) due to frequent usage of neologism and word salad. SLP targeted functional naming and use of anomia compensations to aid verbal expression at home. Pt benefited from usual min to mod verbal and visual cues to correct articulation errors secondary to neologisms and perseverations. Pt will benefit from skilled ST targeting improved receptive and expressive language.    Speech Therapy Frequency 2x / week    Duration 12 weeks   or until Medicaid visits are exhausted   Treatment/Interventions Compensatory techniques;Functional tasks;Multimodal communcation approach;Cueing hierarchy;SLP instruction and feedback;Language facilitation;Cognitive reorganization;Compensatory strategies;Internal/external aids;Patient/family education    Potential to Achieve Goals Fair    Potential Considerations Severity of impairments;Financial resources    SLP Home Exercise Plan provided    Consulted and Agree with Plan of Care Patient;Family member/caregiver    Family Member Consulted wife             Patient will benefit from skilled therapeutic intervention in order to improve the following deficits and impairments:   Aphasia  Verbal apraxia    Problem List Patient Active Problem List   Diagnosis Date Noted   AKI (acute kidney injury) (HCC)    Benign essential HTN    Hyponatremia    Acute blood loss anemia    Urinary retention    Pressure injury of skin 11/17/2020   Respiratory arrest (HCC) 11/14/2020   Acute respiratory failure with hypoxia (HCC) 11/14/2020   Adverse effects of medication 11/14/2020   Hypotension 11/14/2020   Encounter for central line placement    Aortic dissection (HCC) 11/13/2020   Acute ischemic right MCA stroke (HCC) 11/13/2020   Agitation 11/13/2020   Atelectasis     Phlebitis    Polysubstance abuse (HCC)    Elevated troponin 11/07/2020   New onset left bundle branch block (LBBB) 11/07/2020   Protein-calorie malnutrition, severe 11/07/2020   Encephalopathy acute    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 [place], [place], [address] Phone: [phone]   Fax:  [phone]   Name: Vernice Bowker MRN: [id number] Date of Birth: 09/05/1975

## 2021-01-19 NOTE — Patient Instructions (Signed)
Things associated with fishing       Things associated with Therapist, sports

## 2021-01-19 NOTE — Therapy (Signed)
[facility] [address] [place], [place], [address] Phone: [phone]   Fax:  [phone]  Physical Therapy Treatment  Patient Details  Name: Jesse [name] MRN: [id number] Date of Birth: [birth year] Referring Provider (PT): Angiulli (follow up with Ihor Austin, NP)   Encounter Date: 01/19/2021   PT End of Session - 01/19/21 1438     Visit Number 3    Number of Visits 7    Date for PT Re-Evaluation 02/20/21    Authorization Type Medicaid [employer]-auth requested    PT Start Time 1018    PT Stop Time 1059    PT Time Calculation (min) 41 min    Equipment Utilized During Treatment Gait belt    Activity Tolerance Patient tolerated treatment well    Behavior During Therapy [facility] for tasks assessed/performed             Past Medical History:  Diagnosis Date   Anxiety    Depression    Essential hypertension    Myocardial infarction (HCC)    S/P aortic dissection repair 11/05/2020   Straight graft replacement of ascending aorta and proximal transverse aortic arch with re-suspension of native aortic valve and open hemi arch distal anastomosis with aorta to right common carotid bypass and aorta to right subclavian bypass   Stroke [facility])     Past Surgical History:  Procedure Laterality Date   ASCENDING AORTIC ROOT REPLACEMENT N/A 11/04/2020   Procedure: 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 WITH REPLACEMENT OF ASCENDING AORTA AND HEMIARCH USING HEMASHIELD PLATINUM GRAFT AND HEMASHIELD GOLD 14 X GRAFT, RESUSPENSION OF NATIVE VALVE, AORTA TO RIGHT CAROTID BYPASS, AORTA TO RIGHT SUBCLAVIAN BYPASS;  Surgeon: Purcell Nails, MD;  Location: [facility] OR;  Service: Open Heart Surgery;  Laterality: N/A;   TEE WITHOUT CARDIOVERSION N/A 11/04/2020   Procedure: TRANSESOPHAGEAL ECHOCARDIOGRAM (TEE);  Surgeon: Purcell Nails, MD;  Location: [facility] OR;  Service: Open Heart Surgery;  Laterality: N/A;    Vitals:    01/19/21 1020 01/19/21 1027  BP: (!) 87/64 (!) 103/59  Pulse: 93 92     Subjective Assessment - 01/19/21 1017     Subjective No changes, no falls.    Patient is accompained by: Family member    Patient Stated Goals Pt's goals for therapy are to "get back to me".  He wants to go fishing, long distances.    Currently in Pain? No/denies    Pain Onset 1 to 4 weeks ago                               [facility] Adult PT Treatment/Exercise - 01/19/21 0001       Ambulation/Gait   Ambulation/Gait Yes    Ambulation/Gait Assistance 5: Supervision;4: Min guard    Ambulation/Gait Assistance Details PT cues pt to use blue/white tile as visual cue for widening BOS with gait.  PT provides light cues at hips for weigthshifting to increase stance time with wider BOS.    Ambulation Distance (Feet) 345 Feet    Assistive device None    Gait Pattern Step-through pattern;Decreased arm swing - left;Decreased step length - left;Narrow base of support    Ambulation Surface Level;Indoor      Exercises   Exercises Knee/Hip      Knee/Hip Exercises: Seated   Long Arc Quad AROM;Right;Left;1 set;10 reps    Other Seated Knee/Hip Exercises ankle pumps x 10 reps.  Discussed use of exercises in sitting prior to standing when pt having episodes of low blood pressure.    Marching Both;1 set;10 reps            Reviewed HEP given last visit:  Exercises-pt return demo understanding with picture cues and verbal reminders  Sit to Stand Without Arm Support - 2 x daily - 7 x weekly - 2 sets - 10 reps Squat - 2 x daily - 7 x weekly - 2 sets - 10 reps Standing Single Leg Stance with Counter Support - 2 x daily - 7 x weekly - 2 sets - 3 reps - 15 sec hold Standing Romberg to 3/4 Tandem Stance - 2 x daily - 7 x weekly - 2 sets - 3 reps - 10 sec hold Romberg Stance with Head Nods - 2 x daily - 7 x weekly - 1 sets - 10 reps Romberg Stance with Head Rotation - 2 x daily - 7 x weekly - 1 sets - 10  reps      Balance Exercises - 01/19/21 0001       Balance Exercises: Standing   Standing Eyes Opened Wide (BOA);Head turns;Foam/compliant surface;Limitations;Narrow base of support (BOS)    Standing Eyes Opened Limitations Standing on outside of parallel bars with intermittent UE support:  head turns x 5, head nods x 5    Gait with Head Turns Forward;Intermittent upper extremity support;4 reps;Limitations    Gait with Head Turns Limitations Walking 10-15 ft, head turns with min guard support, 4 reps    Retro Gait Foam/compliant surface;Upper extremity support;3 reps;Limitations    Retro Gait Limitations Forward/back gait along blue mat outside of parallel bars for support, x 3 reps, cues for widened BOS and increased step length.    Sidestepping Upper extremity support;3 reps;Foam/compliant support;Limitations    Sidestepping Limitations On blue compliant mat:  R and L 3 reps                PT Education - 01/19/21 1436     Education Details Seated exercises to perform prior to standing (to help with transition, when BP is lower); slowed transitions to standing to avoid dizziness/lightheadedness upon standing; drinking plenty of fluids (within MD recommendations) to help lessen fluctuations in BP    Person(s) Educated Patient;Spouse    Methods Explanation;Demonstration;Handout    Comprehension Verbalized understanding;Returned demonstration              PT Short Term Goals - 01/05/21 2135       PT SHORT TERM GOAL #1   Title Pt will perform HEP with family supervision for improved strength, balance, transfers, and gait.  TARGET  01/30/2021    Baseline No current HEP    Time 3    Period Weeks    Status New      PT SHORT TERM GOAL #2   Title Pt will perform sit<>stand transfers, at least 5 reps minimal to no UE support and independently for improved functional strength.    Baseline UE support and supervision for sit<>stand    Time 3    Period Weeks    Status New       PT SHORT TERM GOAL #3   Title Pt will improve TUG score to less than or equal to 15 sec for decreased fall risk.    Baseline 22.84 sec (>13.5 sec indicates increased fall risk)    Time 3    Period Weeks    Status New  PT SHORT TERM GOAL #4   Title Pt will improve DGI score to at least 14/24 to decrease fall risk.    Baseline 11/24 (scores <19/24 indicate increased fall risk)    Time 3    Period Weeks    Status New      PT SHORT TERM GOAL #5   Title Pt/wife will verbalize understanding of fall prevention in the home environment.    Baseline Pt at fall risk per TUG and DGI scores.    Time 3    Period Weeks    Status New               PT Long Term Goals - 01/05/21 2139       PT LONG TERM GOAL #1   Title Pt will perform progression of HEP with family supervision for improved strength, balance, transfers, and gait.    Baseline no current HEP    Time 6    Period Weeks    Status New      PT LONG TERM GOAL #2   Title Pt will improve gait velocity to at least 2.62 ft/sec for improved gait efficiency and safety.    Baseline 2.23 ft/sec    Time 6    Period Weeks    Status New      PT LONG TERM GOAL #3   Title Pt will improve TUG score to less than or equal to 13.5 sec for decreased fall risk.    Baseline 22.84 sec    Time 6    Period Weeks    Status New      PT LONG TERM GOAL #4   Title Pt will improve DGI score to at least 19/24 to decrease fall risk.    Baseline 11/24    Time 6    Period Weeks    Status New      PT LONG TERM GOAL #5   Title Pt will ambulate at least 1000 ft, indoors and outdoors, no device, independently for improved household and community gait for activities with children and family.    Baseline Currently min guard for 150 ft/indoor distances    Time 6    Period Weeks    Status New                   Plan - 01/19/21 1439     Clinical Impression Statement Skilled PT session today focused on education for lower extremity  exercises when BP is running low (today started at 84/67 and then increased after seated exercises to 103/59).  Also worked on balance exercises and review of HEP.  Pt continues to demonstrate narrowed BOS with transition movements and with gait, which causes pt to be unsteady.  He will continue to benefit from skilled PT to further address balance, functional strength and gait for improved mobility and decreased fall risk.    Personal Factors and Comorbidities Comorbidity 3+    Comorbidities PMH: Anxiety, Depression, Essential hypertension    Examination-Activity Limitations Locomotion Level;Transfers;Sit;Stairs;Stand;Dressing;Toileting;Caring for Others    Examination-Participation Restrictions Occupation;Community Activity;Driving;Interpersonal Relationship    Stability/Clinical Decision Making Unstable/Unpredictable   postural hypotension, low blood pressure   Rehab Potential Good    PT Frequency 1x / week    PT Duration 6 weeks   plus eval   PT Treatment/Interventions ADLs/Self Care Home Management;Electrical Stimulation;DME Instruction;Gait training;Stair training;Functional mobility training;Therapeutic activities;Therapeutic exercise;Balance training;Neuromuscular re-education;Passive range of motion;Patient/family education;Orthotic Fit/Training;Vestibular    PT Next Visit Plan Check STGs.  Add to HEP as able for gait with head turns/counter balance exercises.  Need to monitor vitals, as pt has been experiencing variable BP    PT Home Exercise Plan [email or web address]    Consulted and Agree with Plan of Care Patient;Family member/caregiver    Family Member Consulted wife             Patient will benefit from skilled therapeutic intervention in order to improve the following deficits and impairments:  Abnormal gait, Difficulty walking, Decreased balance, Decreased mobility, Decreased strength, Postural dysfunction  Visit Diagnosis: Unsteadiness on feet  Other abnormalities of gait and  mobility  Muscle weakness (generalized)     Problem List Patient Active Problem List   Diagnosis Date Noted   AKI (acute kidney injury) (HCC)    Benign essential HTN    Hyponatremia    Acute blood loss anemia    Urinary retention    Pressure injury of skin 11/17/2020   Respiratory arrest (HCC) 11/14/2020   Acute respiratory failure with hypoxia (HCC) 11/14/2020   Adverse effects of medication 11/14/2020   Hypotension 11/14/2020   Encounter for central line placement    Aortic dissection (HCC) 11/13/2020   Acute ischemic right MCA stroke (HCC) 11/13/2020   Agitation 11/13/2020   Atelectasis    Phlebitis    Polysubstance abuse (HCC)    Elevated troponin 11/07/2020   New onset left bundle branch block (LBBB) 11/07/2020   Protein-calorie malnutrition, severe 11/07/2020   Encephalopathy acute    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 [place], [place], [address] Phone: [phone]   Fax:  [phone]  Name: Jesse [name] MRN: [id number] Date of Birth: 05/04/1975

## 2021-01-20 DIAGNOSIS — R338 Other retention of urine: Secondary | ICD-10-CM | POA: Diagnosis not present

## 2021-01-22 ENCOUNTER — Other Ambulatory Visit: Payer: Self-pay | Admitting: Physical Medicine & Rehabilitation

## 2021-01-23 ENCOUNTER — Other Ambulatory Visit: Payer: Self-pay

## 2021-01-23 ENCOUNTER — Ambulatory Visit: Payer: Medicaid Other

## 2021-01-23 DIAGNOSIS — R41842 Visuospatial deficit: Secondary | ICD-10-CM | POA: Diagnosis not present

## 2021-01-23 DIAGNOSIS — I69354 Hemiplegia and hemiparesis following cerebral infarction affecting left non-dominant side: Secondary | ICD-10-CM | POA: Diagnosis not present

## 2021-01-23 DIAGNOSIS — R4701 Aphasia: Secondary | ICD-10-CM | POA: Diagnosis not present

## 2021-01-23 DIAGNOSIS — R482 Apraxia: Secondary | ICD-10-CM | POA: Diagnosis not present

## 2021-01-23 DIAGNOSIS — R293 Abnormal posture: Secondary | ICD-10-CM | POA: Diagnosis not present

## 2021-01-23 DIAGNOSIS — R278 Other lack of coordination: Secondary | ICD-10-CM | POA: Diagnosis not present

## 2021-01-23 DIAGNOSIS — R2681 Unsteadiness on feet: Secondary | ICD-10-CM | POA: Diagnosis not present

## 2021-01-23 DIAGNOSIS — M6281 Muscle weakness (generalized): Secondary | ICD-10-CM | POA: Diagnosis not present

## 2021-01-23 DIAGNOSIS — R41840 Attention and concentration deficit: Secondary | ICD-10-CM | POA: Diagnosis not present

## 2021-01-23 DIAGNOSIS — R2689 Other abnormalities of gait and mobility: Secondary | ICD-10-CM | POA: Diagnosis not present

## 2021-01-23 NOTE — Therapy (Signed)
[facility] [address] [place], [place], [address] Phone: [phone]   Fax:  [phone]  Speech Language Pathology Treatment  Patient Details  Name: Jesse [name] MRN: [id number] Date of Birth: [birth year] Referring Provider (SLP): Jesse Dollar, PA-C   Encounter Date: 01/23/2021   End of Session - 01/23/21 1015     Visit Number 4    Number of Visits 17    Date for SLP Re-Evaluation 04/05/21    Authorization Type Medicaid    Authorization - Visit Number 3    Authorization - Number of Visits 9    SLP Start Time 1015    SLP Stop Time  1100    SLP Time Calculation (min) 45 min    Activity Tolerance Patient tolerated treatment well             Past Medical History:  Diagnosis Date   Anxiety    Depression    Essential hypertension    Myocardial infarction (HCC)    S/P aortic dissection repair 11/05/2020   Straight graft replacement of ascending aorta and proximal transverse aortic arch with re-suspension of native aortic valve and open hemi arch distal anastomosis with aorta to right common carotid bypass and aorta to right subclavian bypass   Stroke [facility])     Past Surgical History:  Procedure Laterality Date   ASCENDING AORTIC ROOT REPLACEMENT N/A 11/04/2020   Procedure: 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 WITH REPLACEMENT OF ASCENDING AORTA AND HEMIARCH USING HEMASHIELD PLATINUM GRAFT AND HEMASHIELD GOLD 14 X GRAFT, RESUSPENSION OF NATIVE VALVE, AORTA TO RIGHT CAROTID BYPASS, AORTA TO RIGHT SUBCLAVIAN BYPASS;  Surgeon: Jesse Nails, MD;  Location: [facility] OR;  Service: Open Heart Surgery;  Laterality: N/A;   TEE WITHOUT CARDIOVERSION N/A 11/04/2020   Procedure: TRANSESOPHAGEAL ECHOCARDIOGRAM (TEE);  Surgeon: Jesse Nails, MD;  Location: [facility] OR;  Service: Open Heart Surgery;  Laterality: N/A;    There were no vitals filed for this visit.   Subjective Assessment - 01/23/21 1016      Subjective "I'm okay"    Patient is accompained by: Family member   wife, Jesse [name]   Currently in Pain? No/denies                   ADULT SLP TREATMENT - 01/23/21 1015       General Information   Behavior/Cognition Alert;Cooperative;Pleasant mood;Other (comment)   frustration     Treatment Provided   Treatment provided Cognitive-Linquistic      Cognitive-Linquistic Treatment   Treatment focused on Aphasia;Apraxia    Skilled Treatment Pt named 7 family members with extra time x1. SLP reviewed associated words with favorite hobbies, in which visual aid was effective x1 as pt stated "catching" independently. SLP trialed sentence completion, which was largely ineffective. Usual mod verbal and visual cues required to aid articulation errors. Pt stated "I'm sorry" x13 during session when word finding occured. SLP suggested a pause or breath if word finding occurs. SLP trialed writing, in which pt accurately wrote name, birthdate, and age (self-corrected). Difficulty with writing home address exhibited, despite phonemic and letter cues. Intermittent breaks required due to frustration related to communication this session.      Assessment / Recommendations / Plan   Plan Continue with current plan of care      Progression Toward Goals   Progression toward goals Progressing toward goals              SLP  Education - 01/23/21 1202     Education Details compensations, writing, breaks when frustrated    Person(s) Educated Patient;Spouse    Methods Explanation;Demonstration;Verbal cues;Handout    Comprehension Verbalized understanding;Returned demonstration;Verbal cues required;Need further instruction              SLP Short Term Goals - 01/23/21 1015       SLP SHORT TERM GOAL #1   Title pt will functionally name 7 members of his family/close friends correctly with modified independence in 3 session    Baseline 1; 01-19-21, 01-23-21    Time 6    Period Weeks    Status On-going     Target Date 02/13/21      SLP SHORT TERM GOAL #2   Title pt will answer high interest mod complex yes/no questions about familiar topics with 80% success in 3 sessions    Baseline simple and min-mod complex questions 75%    Time 6    Period Weeks    Status On-going    Target Date 02/13/21      SLP SHORT TERM GOAL #3   Title pt will demo understanding of simple requests in a timely manner in 3 sessions    Baseline extra time consistently necessary; 01-19-21    Time 6    Period Weeks    Status On-going    Target Date 02/13/21      SLP SHORT TERM GOAL #4   Title pt would demonstrate functional understanding in role-play situation/s for 80% success in 3 sessions    Baseline SLP needed to repeat and slow rate of speech to give pt success    Time 6    Period Weeks    Status On-going      SLP SHORT TERM GOAL #5   Title pt will demo functional articulation (no neologism) in 3 simple conversational turns in 2 sessions    Baseline marked neologism in conversation    Time 6    Period Weeks    Status On-going    Target Date 02/13/21              SLP Long Term Goals - 01/23/21 1015       SLP LONG TERM GOAL #1   Title pt will demo understanding of verbal errors by restart or revision 80% of appropriate times, with modified independence, in 3 session    Time 12    Period Weeks    Status On-going    Target Date 04/05/21      SLP LONG TERM GOAL #2   Title pt will functionally communicate in 5 minutes simple conversation using compensations in 2 sessions    Baseline did not do so    Time 12    Period Weeks    Status On-going    Target Date 04/05/21      SLP LONG TERM GOAL #3   Title wife will demo understanding how best to cue pt for auditory comprehension in 3 sessions    Baseline not demonstrated    Time 12    Period Weeks    Status On-going    Target Date 04/05/21      SLP LONG TERM GOAL #4   Title wife will demo understanding how best to cue pt for verbal  expression in 3 sessions    Baseline not demonstrated    Time 12    Period Weeks    Status On-going    Target Date 04/05/21  Plan - 01/23/21 1015     Clinical Impression Statement Jesse [name] [facility]") presents today with severe aphasia (likely Wernicke's type) due to frequent usage of neologism and word salad. SLP targeted functional naming and use of anomia compensations to aid verbal expression at home. Writing biographical information was occasional successful today. Pt benefited from usual mod verbal and visual cues to correct articulation errors secondary to neologisms and perseverations. Frustrated noted this session, with breaks and education provided to reduce frustration. Pt will benefit from skilled ST targeting improved receptive and expressive language.    Speech Therapy Frequency 2x / week    Duration 12 weeks   or until Medicaid visits are exhausted   Treatment/Interventions Compensatory techniques;Functional tasks;Multimodal communcation approach;Cueing hierarchy;SLP instruction and feedback;Language facilitation;Cognitive reorganization;Compensatory strategies;Internal/external aids;Patient/family education    Potential to Achieve Goals Fair    Potential Considerations Severity of impairments;Financial resources    SLP Home Exercise Plan provided    Consulted and Agree with Plan of Care Patient;Family member/caregiver    Family Member Consulted wife             Patient will benefit from skilled therapeutic intervention in order to improve the following deficits and impairments:   Aphasia  Verbal apraxia    Problem List Patient Active Problem List   Diagnosis Date Noted   AKI (acute kidney injury) (HCC)    Benign essential HTN    Hyponatremia    Acute blood loss anemia    Urinary retention    Pressure injury of skin 11/17/2020   Respiratory arrest (HCC) 11/14/2020   Acute respiratory failure with hypoxia (HCC) 11/14/2020   Adverse effects  of medication 11/14/2020   Hypotension 11/14/2020   Encounter for central line placement    Aortic dissection (HCC) 11/13/2020   Acute ischemic right MCA stroke (HCC) 11/13/2020   Agitation 11/13/2020   Atelectasis    Phlebitis    Polysubstance abuse (HCC)    Elevated troponin 11/07/2020   New onset left bundle branch block (LBBB) 11/07/2020   Protein-calorie malnutrition, severe 11/07/2020   Encephalopathy acute    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 [place], [place], [address] Phone: [phone]   Fax:  [phone]   Name: Jesse [name] MRN: [id number] Date of Birth: 04/02/1976

## 2021-01-23 NOTE — Patient Instructions (Addendum)
Apps to try:  Talk Path  T[employer] - aphasia essentials & apraxia therapy (free for short trial)    Things to work on:  Writing your information (name, birth date, age, address, phone number, family member names)

## 2021-01-26 ENCOUNTER — Encounter: Payer: Self-pay | Admitting: Physical Therapy

## 2021-01-26 ENCOUNTER — Ambulatory Visit: Payer: Medicaid Other | Admitting: Occupational Therapy

## 2021-01-26 ENCOUNTER — Ambulatory Visit: Payer: Medicaid Other | Attending: Physician Assistant | Admitting: Physical Therapy

## 2021-01-26 ENCOUNTER — Ambulatory Visit: Payer: Medicaid Other

## 2021-01-26 ENCOUNTER — Other Ambulatory Visit: Payer: Self-pay

## 2021-01-26 VITALS — BP 118/82

## 2021-01-26 DIAGNOSIS — R482 Apraxia: Secondary | ICD-10-CM | POA: Insufficient documentation

## 2021-01-26 DIAGNOSIS — R278 Other lack of coordination: Secondary | ICD-10-CM | POA: Insufficient documentation

## 2021-01-26 DIAGNOSIS — R41840 Attention and concentration deficit: Secondary | ICD-10-CM | POA: Insufficient documentation

## 2021-01-26 DIAGNOSIS — R41842 Visuospatial deficit: Secondary | ICD-10-CM | POA: Insufficient documentation

## 2021-01-26 DIAGNOSIS — I69354 Hemiplegia and hemiparesis following cerebral infarction affecting left non-dominant side: Secondary | ICD-10-CM | POA: Diagnosis not present

## 2021-01-26 DIAGNOSIS — R2689 Other abnormalities of gait and mobility: Secondary | ICD-10-CM | POA: Diagnosis not present

## 2021-01-26 DIAGNOSIS — M6281 Muscle weakness (generalized): Secondary | ICD-10-CM | POA: Diagnosis not present

## 2021-01-26 DIAGNOSIS — R4701 Aphasia: Secondary | ICD-10-CM

## 2021-01-26 DIAGNOSIS — R2681 Unsteadiness on feet: Secondary | ICD-10-CM | POA: Diagnosis not present

## 2021-01-26 NOTE — Therapy (Signed)
[facility] [address] [place], [place], [address] Phone: [phone]   Fax:  [phone]  Occupational Therapy Treatment  Patient Details  Name: Jesse [name] MRN: [id number] Date of Birth: [birth year] Referring Provider (OT): f/u Ihor Austin, NP   Encounter Date: 01/26/2021   OT End of Session - 01/26/21 1050     Visit Number 4    Number of Visits 7    Date for OT Re-Evaluation 03/03/21   6 visits over 8 weeks   Authorization Type [employer]    Authorization Time Period 27 combined, Auth req'd    OT Start Time 1018    OT Stop Time 1100    OT Time Calculation (min) 42 min    Activity Tolerance Patient tolerated treatment well    Behavior During Therapy [facility] for tasks assessed/performed             Past Medical History:  Diagnosis Date   Anxiety    Depression    Essential hypertension    Myocardial infarction (HCC)    S/P aortic dissection repair 11/05/2020   Straight graft replacement of ascending aorta and proximal transverse aortic arch with re-suspension of native aortic valve and open hemi arch distal anastomosis with aorta to right common carotid bypass and aorta to right subclavian bypass   Stroke [facility])     Past Surgical History:  Procedure Laterality Date   ASCENDING AORTIC ROOT REPLACEMENT N/A 11/04/2020   Procedure: 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 WITH REPLACEMENT OF ASCENDING AORTA AND HEMIARCH USING HEMASHIELD PLATINUM GRAFT AND HEMASHIELD GOLD 14 X GRAFT, RESUSPENSION OF NATIVE VALVE, AORTA TO RIGHT CAROTID BYPASS, AORTA TO RIGHT SUBCLAVIAN BYPASS;  Surgeon: Purcell Nails, MD;  Location: [facility] OR;  Service: Open Heart Surgery;  Laterality: N/A;   TEE WITHOUT CARDIOVERSION N/A 11/04/2020   Procedure: TRANSESOPHAGEAL ECHOCARDIOGRAM (TEE);  Surgeon: Purcell Nails, MD;  Location: [facility] OR;  Service: Open Heart Surgery;  Laterality: N/A;    There were no vitals  filed for this visit.   Subjective Assessment - 01/26/21 1019     Patient is accompanied by: Family member    Pertinent History Anxiety, Depression, Essential hypertension.    Limitations Global Aphasia, Fall, *foley catheter*    Patient Stated Goals unable to state d/t aphasia    Currently in Pain? No/denies              Tabletop scanning matching digital to analogue times with slightly extra time for processing.  Environmental scanning: 1st pass finding 7/13 items (54% accuracy) missing 5 on Rt, 1 on Lt. 2nd pass finding 3/6 remaining items, missing 2 upper Rt and 1 Lt Tossing small ball between hands, followed by tossing ball in each hand alternating then simultaneously w/ min drops Lt hand  Copying peg design for coordination, visual scanning and perception with 1 initial error corrected by therapist - pt able to then copy 100% accurately placing pegs in with Rt hand, removing with Lt hand.                       OT Short Term Goals - 01/26/21 1051       OT SHORT TERM GOAL #1   Title Pt will be independent with HEP targeting coordination and LUE grip and proximal strength    Baseline not issued at eval    Time 4    Period Weeks    Status On-going  Target Date 02/02/21      OT SHORT TERM GOAL #2   Title Pt and caregiver will verbalize understanding of visual strategies for increasing independence with ADLs and IADLs.    Baseline not reviewed at eval    Time 4    Period Weeks    Status On-going      OT SHORT TERM GOAL #3   Title Pt will report shaving self with good safety awareness and with distant supervision only.    Baseline spouse currently shaving/cutting hair    Time 4    Period Weeks    Status New      OT SHORT TERM GOAL #4   Title Pt will perform simple warm meal prep with supervision and good safety awareness (i.e. scrambled eggs, grilled cheese)    Baseline not currently performing    Time 4    Period Weeks    Status New                OT Long Term Goals - 01/14/21 1038       OT LONG TERM GOAL #1   Title Pt will be independent with any updated HEPs    Baseline not issued at eval    Time 8    Period Weeks    Status New    Target Date 03/11/21      OT LONG TERM GOAL #2   Title Pt will perform 9 hole peg test in 48 seconds or less with BUE for increasing fine motor coordination    Baseline L 51.47s, R 58.56s    Time 8    Period Weeks    Status Revised      OT LONG TERM GOAL #3   Title Pt will improve grip strength in LUE to 66 lbs or greater.    Baseline L 61.9, R 73.8    Time 8    Period Weeks    Status New      OT LONG TERM GOAL #4   Title Pt will perform simple warm meal prep and/or light home management task with mod I and good safety awareness    Baseline not completing    Time 8    Period Weeks    Status New      OT LONG TERM GOAL #5   Title Pt will perform environmental scanning with 90% accuracy    Baseline did not assess at eval, left inattention    Time 8    Period Weeks    Status New                   Plan - 01/26/21 1052     Clinical Impression Statement Pt progressing with coordination and tabeltop scanning. Pt with deficits in evironmental scanning to RT    OT Occupational Profile and History Problem Focused Assessment - Including review of records relating to presenting problem    Occupational performance deficits (Please refer to evaluation for details): ADL's;IADL's;Work;Leisure    Body Structure / Function / Physical Skills FMC;ADL;Decreased knowledge of use of DME;Strength;GMC;Dexterity;UE functional use;IADL;Vision;Mobility    Cognitive Skills Understand;Attention    Rehab Potential Good    Clinical Decision Making Limited treatment options, no task modification necessary    Comorbidities Affecting Occupational Performance: None    Modification or Assistance to Complete Evaluation  No modification of tasks or assist necessary to complete eval    OT  Frequency 1x / week    OT Duration 8 weeks  6 visits over 8 weeks   OT Treatment/Interventions Self-care/ADL training;DME and/or AE instruction;Therapeutic activities;Therapeutic exercise;Cognitive remediation/compensation;Patient/family education;Visual/perceptual remediation/compensation;Passive range of motion;Neuromuscular education;Energy conservation    Plan assess STG's, issue putty for Lt hand, issue visual scanning strategies (following session: cook egg or grilled cheese)    Consulted and Agree with Plan of Care Family member/caregiver;Patient    Family Member Consulted Gina, spouse             Patient will benefit from skilled therapeutic intervention in order to improve the following deficits and impairments:   Body Structure / Function / Physical Skills: [facility], ADL, Decreased knowledge of use of DME, Strength, GMC, Dexterity, UE functional use, IADL, Vision, Mobility Cognitive Skills: Understand, Attention     Visit Diagnosis: Hemiplegia and hemiparesis following cerebral infarction affecting left non-dominant side (HCC)  Attention and concentration deficit  Visuospatial deficit  Other lack of coordination    Problem List Patient Active Problem List   Diagnosis Date Noted   AKI (acute kidney injury) (HCC)    Benign essential HTN    Hyponatremia    Acute blood loss anemia    Urinary retention    Pressure injury of skin 11/17/2020   Respiratory arrest (HCC) 11/14/2020   Acute respiratory failure with hypoxia (HCC) 11/14/2020   Adverse effects of medication 11/14/2020   Hypotension 11/14/2020   Encounter for central line placement    Aortic dissection (HCC) 11/13/2020   Acute ischemic right MCA stroke (HCC) 11/13/2020   Agitation 11/13/2020   Atelectasis    Phlebitis    Polysubstance abuse (HCC)    Elevated troponin 11/07/2020   New onset left bundle branch block (LBBB) 11/07/2020   Protein-calorie malnutrition, severe 11/07/2020   Encephalopathy acute     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 [place], [place], [address] Phone: [phone]   Fax:  [phone]  Name: Khale Nigh MRN: [id number] Date of Birth: 1975-05-29

## 2021-01-26 NOTE — Therapy (Signed)
[facility] [address] [place], [place], [address] Phone: [phone]   Fax:  [phone]  Physical Therapy Treatment  Patient Details  Name: Jesse [name] MRN: [id number] Date of Birth: 04/23/1976 Referring Provider (PT): [place] (follow up with Frann Rider, NP)   Encounter Date: 01/26/2021   PT End of Session - 01/26/21 0939     Visit Number 4    Number of Visits 7    Date for PT Re-Evaluation 02/20/21    Authorization Type Medicaid Healthy [employer]-auth requested    PT Start Time 0930    PT Stop Time 1021    PT Time Calculation (min) 45 min    Activity Tolerance Patient tolerated treatment well             Past Medical History:  Diagnosis Date   Anxiety    Depression    Essential hypertension    Myocardial infarction ([place])    S/P aortic dissection repair 11/05/2020   Straight graft replacement of ascending aorta and proximal transverse aortic arch with re-suspension of native aortic valve and open hemi arch distal anastomosis with aorta to right common carotid bypass and aorta to right subclavian bypass   Stroke [facility])     Past Surgical History:  Procedure Laterality Date   ASCENDING AORTIC ROOT REPLACEMENT N/A 11/04/2020   Procedure: 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 WITH REPLACEMENT OF ASCENDING AORTA AND HEMIARCH USING HEMASHIELD PLATINUM 26MM GRAFT AND HEMASHIELD GOLD 14 X 8MM GRAFT, RESUSPENSION OF NATIVE VALVE, AORTA TO RIGHT CAROTID BYPASS, AORTA TO RIGHT SUBCLAVIAN BYPASS;  Surgeon: Rexene Alberts, MD;  Location: [facility];  Service: Open Heart Surgery;  Laterality: N/A;   TEE WITHOUT CARDIOVERSION N/A 11/04/2020   Procedure: TRANSESOPHAGEAL ECHOCARDIOGRAM (TEE);  Surgeon: Rexene Alberts, MD;  Location: [facility];  Service: Open Heart Surgery;  Laterality: N/A;    Vitals:   01/26/21 1024  BP: 118/82     Subjective Assessment - 01/26/21 0934     Subjective Pt walks outside at home on  uneven surfaces, self corrects imbalance but no major stumbles.    Currently in Pain? No/denies                [facility] PT Assessment - 01/26/21 0001       Transfers   Transfers Sit to Stand;Stand to Sit    Sit to Stand 7: Independent    Five time sit to stand comments  14.81      Dynamic Gait Index   Level Surface Normal    Change in Gait Speed Normal    Gait with Horizontal Head Turns Mild Impairment    Gait with Vertical Head Turns Normal    Gait and Pivot Turn Normal    Step Over Obstacle Normal    Step Around Obstacles Mild Impairment    Steps Normal    Total Score 22    DGI comment: Dynamic Gait Index  Scores of 19 or less are predictive of falls in older community living adults      Timed Up and Go Test   TUG Normal TUG    Normal TUG (seconds) 12.18    TUG Comments Scores >13.5 sec indicate increased fall risk.                           [facility] Adult PT Treatment/Exercise - 01/26/21 0001       Exercises   Exercises Knee/Hip  Other Exercises  seated hip abd, bil with yellow theraband x20; seated marching with yellow band ,x20   seated LE strengthening to increase BP due to BP being low with initial check (machine would not read), checked manually after seated ther ex. 118/82                    PT Education - 01/26/21 1037     Education Details Discussed goals checked, benefits of updating HEP.    Person(s) Educated Patient;Spouse    Methods Explanation    Comprehension Verbalized understanding              PT Short Term Goals - 01/26/21 1019       PT SHORT TERM GOAL #1   Title Pt will perform HEP with family supervision for improved strength, balance, transfers, and gait.  TARGET  01/30/2021    Baseline Met, 01/26/21    Time 3    Period Weeks    Status Achieved      PT SHORT TERM GOAL #2   Title Pt will perform sit<>stand transfers, at least 5 reps minimal to no UE support and independently for improved functional  strength.    Baseline Met 14.81 sec, without UE support. 01/26/21    Time 3    Period Weeks    Status Achieved      PT SHORT TERM GOAL #3   Title Pt will improve TUG score to less than or equal to 15 sec for decreased fall risk.    Baseline Met, 12.18 sec (>13.5 sec indicates increased fall risk) 01/26/21    Time 3    Period Weeks    Status Achieved      PT SHORT TERM GOAL #4   Title Pt will improve DGI score to at least 14/24 to decrease fall risk.    Baseline Met, 22/24 (scores <19/24 indicate increased fall risk) 01/26/21.    Time 3    Period Weeks    Status Achieved      PT SHORT TERM GOAL #5   Title Pt/wife will verbalize understanding of fall prevention in the home environment.    Baseline Met, 01/26/21    Time 3    Period Weeks    Status Achieved               PT Long Term Goals - 01/05/21 2139       PT LONG TERM GOAL #1   Title Pt will perform progression of HEP with family supervision for improved strength, balance, transfers, and gait.    Baseline no current HEP    Time 6    Period Weeks    Status New      PT LONG TERM GOAL #2   Title Pt will improve gait velocity to at least 2.62 ft/sec for improved gait efficiency and safety.    Baseline 2.23 ft/sec    Time 6    Period Weeks    Status New      PT LONG TERM GOAL #3   Title Pt will improve TUG score to less than or equal to 13.5 sec for decreased fall risk.    Baseline 22.84 sec    Time 6    Period Weeks    Status New      PT LONG TERM GOAL #4   Title Pt will improve DGI score to at least 19/24 to decrease fall risk.    Baseline 11/24    Time 6  Period Weeks    Status New      PT LONG TERM GOAL #5   Title Pt will ambulate at least 1000 ft, indoors and outdoors, no device, independently for improved household and community gait for activities with children and family.    Baseline Currently min guard for 150 ft/indoor distances    Time 6    Period Weeks    Status New                    Plan - 01/26/21 1020     Clinical Impression Statement Pt met all STGs. Pt demonstrates progress with gait in home and community, progressed with LE strength and balance.  Pt would benefit with progressed HEP, balance training on compliant surfaces and continued LE strengthening per POC.  Pt's BP was initially low at begining of PT session but increased after seated LE exercises. Pt will follow up with MD about BP medication.    Personal Factors and Comorbidities Comorbidity 3+    Comorbidities PMH: Anxiety, Depression, Essential hypertension    Examination-Activity Limitations Locomotion Level;Transfers;Sit;Stairs;Stand;Dressing;Toileting;Caring for Others    Examination-Participation Restrictions Occupation;Community Activity;Driving;Interpersonal Relationship    Stability/Clinical Decision Making Unstable/Unpredictable   postural hypotension, low blood pressure   Rehab Potential Good    PT Frequency 1x / week    PT Duration 6 weeks   plus eval   PT Treatment/Interventions ADLs/Self Care Home Management;Electrical Stimulation;DME Instruction;Gait training;Stair training;Functional mobility training;Therapeutic activities;Therapeutic exercise;Balance training;Neuromuscular re-education;Passive range of motion;Patient/family education;Orthotic Fit/Training;Vestibular    PT Next Visit Plan Add to HEP as able for balance on compliant surface; gait with head turns/counter balance exercises.  Need to monitor vitals, as pt has been experiencing variable BP    PT Home Exercise Plan [id number]    Consulted and Agree with Plan of Care Patient;Family member/caregiver    Family Member Consulted wife             Patient will benefit from skilled therapeutic intervention in order to improve the following deficits and impairments:  Abnormal gait, Difficulty walking, Decreased balance, Decreased mobility, Decreased strength, Postural dysfunction  Visit Diagnosis: Unsteadiness on  feet  Other abnormalities of gait and mobility  Muscle weakness (generalized)     Problem List Patient Active Problem List   Diagnosis Date Noted   AKI (acute kidney injury) ([place])    Benign essential HTN    Hyponatremia    Acute blood loss anemia    Urinary retention    Pressure injury of skin 11/17/2020   Respiratory arrest ([place]) 11/14/2020   Acute respiratory failure with hypoxia ([facility]) 11/14/2020   Adverse effects of medication 11/14/2020   Hypotension 11/14/2020   Encounter for central line placement    Aortic dissection ([place]) 11/13/2020   Acute ischemic right MCA stroke ([place]) 11/13/2020   Agitation 11/13/2020   Atelectasis    Phlebitis    Polysubstance abuse ([place])    Elevated troponin 11/07/2020   New onset left bundle branch block (LBBB) 11/07/2020   Protein-calorie malnutrition, severe 11/07/2020   Encephalopathy acute    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)    Acute hypoxemic respiratory failure ([place])    Acute thoracic aortic dissection 11/05/2020   S/P aortic dissection repair 11/05/2020   Cerebral embolism with cerebral infarction 11/05/2020   Essential hypertension     Bjorn Loser, PTA 01/26/2021, 10:38 AM  [facility] [address] [place], [place], [address] Phone: [phone]  Fax:  [phone]  Name: Jesse [name] MRN: [id number] Date of Birth: 02-08-1976

## 2021-01-26 NOTE — Therapy (Signed)
[facility] [address] [place], [place], [address] Phone: [phone]   Fax:  [phone]  Speech Language Pathology Treatment  Patient Details  Name: Jesse [name] MRN: [id number] Date of Birth: 1975-12-17 Referring Provider (SLP): Mariam Dollar, PA-C   Encounter Date: 01/26/2021   End of Session - 01/26/21 1039     Visit Number 5    Number of Visits 17    Date for SLP Re-Evaluation 04/05/21    Authorization Type Medicaid    Authorization - Visit Number 4    Authorization - Number of Visits 9    SLP Start Time 1101    SLP Stop Time  1147    SLP Time Calculation (min) 46 min    Activity Tolerance Patient tolerated treatment well             Past Medical History:  Diagnosis Date   Anxiety    Depression    Essential hypertension    Myocardial infarction ([facility])    S/P aortic dissection repair 11/05/2020   Straight graft replacement of ascending aorta and proximal transverse aortic arch with re-suspension of native aortic valve and open hemi arch distal anastomosis with aorta to right common carotid bypass and aorta to right subclavian bypass   Stroke [facility])     Past Surgical History:  Procedure Laterality Date   ASCENDING AORTIC ROOT REPLACEMENT N/A 11/04/2020   Procedure: 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 WITH REPLACEMENT OF ASCENDING AORTA AND HEMIARCH USING HEMASHIELD PLATINUM GRAFT AND HEMASHIELD GOLD 14 X GRAFT, RESUSPENSION OF NATIVE VALVE, AORTA TO RIGHT CAROTID BYPASS, AORTA TO RIGHT SUBCLAVIAN BYPASS;  Surgeon: Purcell Nails, MD;  [facility];  Service: Open Heart Surgery;  Laterality: N/A;   TEE WITHOUT CARDIOVERSION N/A 11/04/2020   Procedure: TRANSESOPHAGEAL ECHOCARDIOGRAM (TEE);  Surgeon: Purcell Nails, MD;  Location: [facility] OR;  Service: Open Heart Surgery;  Laterality: N/A;    There were no vitals filed for this visit.   Subjective Assessment - 01/26/21 1040      Subjective reported feeling frustrated    Currently in Pain? No/denies                   ADULT SLP TREATMENT - 01/26/21 1038       General Information   Behavior/Cognition Alert;Cooperative;Pleasant mood      Treatment Provided   Treatment provided Cognitive-Linquistic      Cognitive-Linquistic Treatment   Treatment focused on Aphasia;Apraxia    Skilled Treatment Pt named 7 family members with mod I given extra time. SLP provided education with patient and wife re: aphasia and apraxia due to overt frustration experienced over the weekend related to speech changes. SLP targeted high frequency functional words at home, with SLP providing usual mod A for articulation due to neologisms and apraxia. SLP trialed alphabet words and spelling family names, with intermittent difficulty noted. SLP provided alphabet board to trial at home. SLP cued drawing x1, which was effective. Pt stated "I'm sorry" x9 this session when word finding occured.      Assessment / Recommendations / Plan   Plan Continue with current plan of care      Progression Toward Goals   Progression toward goals Progressing toward goals              SLP Education - 01/26/21 1154     Education Details aphasia/apraxia ed, using visual aids, compensations, take breaks    Person(s) Educated Patient;Spouse  Methods Explanation;Demonstration;Verbal cues;Handout    Comprehension Verbalized understanding;Returned demonstration;Verbal cues required;Need further instruction              SLP Short Term Goals - 01/26/21 1039       SLP SHORT TERM GOAL #1   Title pt will functionally name 7 members of his family/close friends correctly with modified independence in 3 session    Baseline 1; 01-19-21, 01-23-21, 01-26-21    Time --    Period --    Status Achieved    Target Date 02/13/21      SLP SHORT TERM GOAL #2   Title pt will answer high interest mod complex yes/no questions about familiar topics with 80%  success in 3 sessions    Baseline simple and min-mod complex questions 75%    Time 6    Period Weeks    Status On-going    Target Date 02/13/21      SLP SHORT TERM GOAL #3   Title pt will demo understanding of simple requests in a timely manner in 3 sessions    Baseline extra time consistently necessary; 01-19-21    Time 6    Period Weeks    Status On-going    Target Date 02/13/21      SLP SHORT TERM GOAL #4   Title pt would demonstrate functional understanding in role-play situation/s for 80% success in 3 sessions    Baseline SLP needed to repeat and slow rate of speech to give pt success    Time 6    Period Weeks    Status On-going      SLP SHORT TERM GOAL #5   Title pt will demo functional articulation (no neologism) in 3 simple conversational turns in 2 sessions    Baseline marked neologism in conversation    Time 6    Period Weeks    Status On-going    Target Date 02/13/21              SLP Long Term Goals - 01/26/21 1040       SLP LONG TERM GOAL #1   Title pt will demo understanding of verbal errors by restart or revision 80% of appropriate times, with modified independence, in 3 session    Time 12    Period Weeks    Status On-going    Target Date 04/05/21      SLP LONG TERM GOAL #2   Title pt will functionally communicate in 5 minutes simple conversation using compensations in 2 sessions    Baseline did not do so    Time 12    Period Weeks    Status On-going    Target Date 04/05/21      SLP LONG TERM GOAL #3   Title wife will demo understanding how best to cue pt for auditory comprehension in 3 sessions    Baseline not demonstrated    Time 12    Period Weeks    Status On-going    Target Date 04/05/21      SLP LONG TERM GOAL #4   Title wife will demo understanding how best to cue pt for verbal expression in 3 sessions    Baseline not demonstrated    Time 12    Period Weeks    Status On-going    Target Date 04/05/21              Plan -  01/26/21 1039     Clinical Impression Statement [name] ("[name]")  presents today with severe aphasia (likely Wernicke's type) 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 SLP targeted functional naming and use of anomia compensations to aid verbal expression at home. Pt benefited from usual mod verbal and visual cues to correct articulation errors secondary to neologisms and perseverations. Frustrated reported over weekend, with SLP providing education re: aphasia and apraxia with recommendations provided. Pt will benefit from skilled ST targeting improved receptive and expressive language.    Speech Therapy Frequency 2x / week    Duration 12 weeks   or until Medicaid visits are exhausted   Treatment/Interventions Compensatory techniques;Functional tasks;Multimodal communcation approach;Cueing hierarchy;SLP instruction and feedback;Language facilitation;Cognitive reorganization;Compensatory strategies;Internal/external aids;Patient/family education    Potential to Achieve Goals Fair    Potential Considerations Severity of impairments;Financial resources    SLP Home Exercise Plan provided    Consulted and Agree with Plan of Care Patient;Family member/caregiver    Family Member Consulted wife             Patient will benefit from skilled therapeutic intervention in order to improve the following deficits and impairments:   Aphasia  Verbal apraxia    Problem List Patient Active Problem List   Diagnosis Date Noted   AKI (acute kidney injury) ([facility])    Benign essential HTN    Hyponatremia    Acute blood loss anemia    Urinary retention    Pressure injury of skin 11/17/2020   Respiratory arrest ([facility]) 11/14/2020   Acute respiratory failure with hypoxia ([facility]) 11/14/2020   Adverse effects of medication 11/14/2020   Hypotension 11/14/2020   Encounter for central line placement    Aortic dissection ([facility]) 11/13/2020   Acute ischemic right MCA stroke ([facility]) 11/13/2020    Agitation 11/13/2020   Atelectasis    Phlebitis    Polysubstance abuse ([facility])    Elevated troponin 11/07/2020   New onset left bundle branch block (LBBB) 11/07/2020   Protein-calorie malnutrition, severe 11/07/2020   Encephalopathy acute    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

## 2021-01-27 ENCOUNTER — Encounter: Payer: Self-pay | Admitting: Family Medicine

## 2021-01-27 ENCOUNTER — Ambulatory Visit (INDEPENDENT_AMBULATORY_CARE_PROVIDER_SITE_OTHER): Payer: Medicaid Other | Admitting: Family Medicine

## 2021-01-27 VITALS — BP 110/79 | HR 96 | Temp 98.0°F | Resp 16 | Ht 75.0 in | Wt 133.2 lb

## 2021-01-27 DIAGNOSIS — Z8673 Personal history of transient ischemic attack (TIA), and cerebral infarction without residual deficits: Secondary | ICD-10-CM | POA: Diagnosis not present

## 2021-01-27 DIAGNOSIS — I959 Hypotension, unspecified: Secondary | ICD-10-CM

## 2021-01-27 DIAGNOSIS — G4709 Other insomnia: Secondary | ICD-10-CM

## 2021-01-27 DIAGNOSIS — F329 Major depressive disorder, single episode, unspecified: Secondary | ICD-10-CM

## 2021-01-27 NOTE — Progress Notes (Signed)
New Patient Office Visit  Subjective:  Patient ID: Nicholson Starace, male    DOB: 1976-04-19  Age: 45 y.o. MRN: [id number]  CC:  Chief Complaint  Patient presents with   Follow-up    HPI Jesse [name] presents for follow up of previous visit for hospital follow up. Patient reports improvement since last office visit with current management.   Past Medical History:  Diagnosis Date   Anxiety    Depression    Essential hypertension    Myocardial infarction [facility])    S/P aortic dissection repair 11/05/2020   Straight graft replacement of ascending aorta and proximal transverse aortic arch with re-suspension of native aortic valve and open hemi arch distal anastomosis with aorta to right common carotid bypass and aorta to right subclavian bypass   Stroke [facility])       History reviewed. No pertinent family history.  Social History   Socioeconomic History   Marital status: Married    Spouse name: Not on file   Number of children: Not on file   Years of education: Not on file   Highest education level: 9th grade  Occupational History   Not on file  Tobacco Use   Smoking status: Former    Types: Cigarettes    Quit date: 11/04/2020    Years since quitting: 0.2   Smokeless tobacco: Never  Vaping Use   Vaping Use: Never used  Substance and Sexual Activity   Alcohol use: Yes    Alcohol/week: 2.0 standard drinks    Types: 2 Cans of beer per week   Drug use: Not Currently   Sexual activity: Yes  Other Topics Concern   Not on file  Social History Narrative   Not on file   Social Determinants of Health   Financial Resource Strain: Not on file  Food Insecurity: Not on file  Transportation Needs: Not on file  Physical Activity: Not on file  Stress: Not on file  Social Connections: Not on file  Intimate Partner Violence: Not on file    ROS Review of Systems  Neurological:  Positive for weakness and numbness.  Psychiatric/Behavioral:  Positive for sleep disturbance.  Negative for self-injury and suicidal ideas. The patient is nervous/anxious.   All other systems reviewed and are negative.  Objective:   Today's Vitals: BP 110/79 (BP Location: Left Arm, Patient Position: Sitting, Cuff Size: Normal)   Pulse 96   Temp 98 F (36.7 C)   Resp 16   Ht 6\' 3"  (1.905 m)   Wt 133 lb 3.2 oz (60.4 kg)   SpO2 97%   BMI 16.65 kg/m   Physical Exam Vitals and nursing note reviewed.  Constitutional:      General: He is not in acute distress. HENT:     Head: Normocephalic and atraumatic.  Cardiovascular:     Rate and Rhythm: Normal rate and regular rhythm.  Pulmonary:     Effort: Pulmonary effort is normal.     Breath sounds: Normal breath sounds.  Abdominal:     Palpations: Abdomen is soft.     Tenderness: There is no abdominal tenderness.  Neurological:     General: No focal deficit present.     Mental Status: He is alert and oriented to person, place, and time.     Motor: Weakness present.  Psychiatric:        Mood and Affect: Mood normal.        Behavior: Behavior normal.    Assessment & Plan:  1. Hypotension, unspecified hypotension type Will d/c amlodipine and continue. Keep scheduled follow up with consultant.   2. Other insomnia Improving with present management. continue  3. Reactive depression Improved with present management. continue  4. History of CVA (cerebrovascular accident) Continuing to improve. Keep scheduled follow ups with consultant    Outpatient Encounter Medications as of 01/27/2021  Medication Sig   acetaminophen (TYLENOL) 325 MG tablet Take 1-2 tablets (325-650 mg total) by mouth every 4 (four) hours as needed for mild pain.   amLODipine (NORVASC) 5 MG tablet Take 2.5 mg by mouth daily.   aspirin 81 MG chewable tablet Chew 1 tablet (81 mg total) by mouth daily.   carvedilol (COREG) 3.125 MG tablet Take 1 tablet (3.125 mg total) by mouth 2 (two) times daily with a meal.   ferrous sulfate 325 (65 FE) MG tablet Take  1 tablet (325 mg total) by mouth daily.   mirtazapine (REMERON) 7.5 MG tablet Take 1 tablet (7.5 mg total) by mouth at bedtime.   pantoprazole sodium (PROTONIX) 40 mg/20 mL PACK Dissolve 1 packet in 20 mLs (40 mg total) and take by mouth daily.   polyethylene glycol (MIRALAX / GLYCOLAX) 17 g packet Take 17 g by mouth daily as needed for moderate constipation.   QUEtiapine (SEROQUEL) 50 MG tablet TAKE 1 TABLET BY MOUTH AT BEDTIME. AND 1/2 TABLET DURING THE DAY AS NEEDED FOR AGITATION   rosuvastatin (CRESTOR) 20 MG tablet Take 1 tablet (20 mg total) by mouth daily.   tamsulosin (FLOMAX) 0.4 MG CAPS capsule Take 2 capsules (0.8 mg total) by mouth daily after supper.   Zinc Sulfate 220 (50 Zn) MG TABS Take 1 tablet (220 mg total) by mouth daily.   No facility-administered encounter medications on file as of 01/27/2021.    Follow-up: Return in about 3 months (around 04/29/2021) for follow up.   Tommie Raymond, MD

## 2021-01-28 ENCOUNTER — Encounter: Payer: Self-pay | Admitting: Family Medicine

## 2021-01-28 DIAGNOSIS — R8271 Bacteriuria: Secondary | ICD-10-CM | POA: Diagnosis not present

## 2021-01-28 DIAGNOSIS — R338 Other retention of urine: Secondary | ICD-10-CM | POA: Diagnosis not present

## 2021-01-30 ENCOUNTER — Ambulatory Visit: Payer: Medicaid Other

## 2021-01-30 ENCOUNTER — Other Ambulatory Visit: Payer: Self-pay

## 2021-01-30 DIAGNOSIS — I69354 Hemiplegia and hemiparesis following cerebral infarction affecting left non-dominant side: Secondary | ICD-10-CM | POA: Diagnosis not present

## 2021-01-30 DIAGNOSIS — R278 Other lack of coordination: Secondary | ICD-10-CM | POA: Diagnosis not present

## 2021-01-30 DIAGNOSIS — R482 Apraxia: Secondary | ICD-10-CM | POA: Diagnosis not present

## 2021-01-30 DIAGNOSIS — R41842 Visuospatial deficit: Secondary | ICD-10-CM | POA: Diagnosis not present

## 2021-01-30 DIAGNOSIS — R2689 Other abnormalities of gait and mobility: Secondary | ICD-10-CM | POA: Diagnosis not present

## 2021-01-30 DIAGNOSIS — R4701 Aphasia: Secondary | ICD-10-CM | POA: Diagnosis not present

## 2021-01-30 DIAGNOSIS — M6281 Muscle weakness (generalized): Secondary | ICD-10-CM | POA: Diagnosis not present

## 2021-01-30 DIAGNOSIS — R2681 Unsteadiness on feet: Secondary | ICD-10-CM | POA: Diagnosis not present

## 2021-01-30 DIAGNOSIS — R41840 Attention and concentration deficit: Secondary | ICD-10-CM | POA: Diagnosis not present

## 2021-01-30 NOTE — Therapy (Signed)
[facility] [address] [place], [place], [address] Phone: [phone]   Fax:  [phone]  Speech Language Pathology Treatment  Patient Details  Name: Jesse [name] MRN: [id number] Date of Birth: 1976/03/30 Referring Provider (SLP): Mariam Dollar, PA-C   Encounter Date: 01/30/2021   End of Session - 01/30/21 1019     Visit Number 6    Number of Visits 17    Date for SLP Re-Evaluation 04/05/21    Authorization Type Medicaid    Authorization - Visit Number 5    Authorization - Number of Visits 9    SLP Start Time 1020    SLP Stop Time  1100    SLP Time Calculation (min) 40 min    Activity Tolerance Patient tolerated treatment well             Past Medical History:  Diagnosis Date   Anxiety    Depression    Essential hypertension    Myocardial infarction (HCC)    S/P aortic dissection repair 11/05/2020   Straight graft replacement of ascending aorta and proximal transverse aortic arch with re-suspension of native aortic valve and open hemi arch distal anastomosis with aorta to right common carotid bypass and aorta to right subclavian bypass   Stroke [facility])     Past Surgical History:  Procedure Laterality Date   ASCENDING AORTIC ROOT REPLACEMENT N/A 11/04/2020   Procedure: 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 WITH REPLACEMENT OF ASCENDING AORTA AND HEMIARCH USING HEMASHIELD PLATINUM GRAFT AND HEMASHIELD GOLD 14 X GRAFT, RESUSPENSION OF NATIVE VALVE, AORTA TO RIGHT CAROTID BYPASS, AORTA TO RIGHT SUBCLAVIAN BYPASS;  Surgeon: Purcell Nails, MD;  Location: [facility] OR;  Service: Open Heart Surgery;  Laterality: N/A;   TEE WITHOUT CARDIOVERSION N/A 11/04/2020   Procedure: TRANSESOPHAGEAL ECHOCARDIOGRAM (TEE);  Surgeon: Purcell Nails, MD;  Location: [facility] OR;  Service: Open Heart Surgery;  Laterality: N/A;    There were no vitals filed for this visit.   Subjective Assessment - 01/30/21 1019      Subjective "about the same"    Patient is accompained by: Family member    Currently in Pain? No/denies                   ADULT SLP TREATMENT - 01/30/21 1019       General Information   Behavior/Cognition Alert;Cooperative;Pleasant mood;Requires cueing      Treatment Provided   Treatment provided Cognitive-Linquistic      Cognitive-Linquistic Treatment   Treatment focused on Aphasia;Apraxia    Skilled Treatment Pt's wife indicated improvements in speech recently. Increased spontaneous speech noted in conversation this session, with less frequent neologisms exhibited. SLP targeted naming and writing functional information related to family, in which pt able to spontaneously name targeted items with ~40% accuracy. Pt wrote numbers with 100% accuracy and wrote words with usual first letters prompts. SLP trialed convergent naming and writing, in which pt able to verbalize colors x4 given letter prompts. Pt spelled words independently for 1/8 trials. Pt benefited from phonemic cues x2 to aid naming. Pt compensated by counting to correct productions of birthdates of family members with good success.      Assessment / Recommendations / Plan   Plan Continue with current plan of care      Progression Toward Goals   Progression toward goals Progressing toward goals              SLP Education - 01/30/21  1117     Education Details compensations, functional communication tasks    Person(s) Educated Patient;Spouse    Methods Explanation;Demonstration;Verbal cues;Handout    Comprehension Verbalized understanding;Returned demonstration;Verbal cues required;Need further instruction              SLP Short Term Goals - 01/30/21 1119       SLP SHORT TERM GOAL #1   Title pt will functionally name 7 members of his family/close friends correctly with modified independence in 3 session    Baseline 1; 01-19-21, 01-23-21, 01-26-21    Status Achieved    Target Date 02/13/21      SLP  SHORT TERM GOAL #2   Title pt will answer high interest mod complex yes/no questions about familiar topics with 80% success in 3 sessions    Baseline simple and min-mod complex questions 75%    Time 6    Period Weeks    Status On-going    Target Date 02/13/21      SLP SHORT TERM GOAL #3   Title pt will demo understanding of simple requests in a timely manner in 3 sessions    Baseline extra time consistently necessary; 01-19-21, 01-30-21    Time 6    Period Weeks    Status On-going    Target Date 02/13/21      SLP SHORT TERM GOAL #4   Title pt would demonstrate functional understanding in role-play situation/s for 80% success in 3 sessions    Baseline SLP needed to repeat and slow rate of speech to give pt success    Time 6    Period Weeks    Status On-going      SLP SHORT TERM GOAL #5   Title pt will demo functional articulation (no neologism) in 3 simple conversational turns in 2 sessions    Baseline marked neologism in conversation    Time 6    Period Weeks    Status On-going    Target Date 02/13/21              SLP Long Term Goals - 01/30/21 1119       SLP LONG TERM GOAL #1   Title pt will demo understanding of verbal errors by restart or revision 80% of appropriate times, with modified independence, in 3 session    Time 12    Period Weeks    Status On-going    Target Date 04/05/21      SLP LONG TERM GOAL #2   Title pt will functionally communicate in 5 minutes simple conversation using compensations in 2 sessions    Baseline did not do so    Time 12    Period Weeks    Status On-going    Target Date 04/05/21      SLP LONG TERM GOAL #3   Title wife will demo understanding how best to cue pt for auditory comprehension in 3 sessions    Baseline not demonstrated    Time 12    Period Weeks    Status On-going    Target Date 04/05/21      SLP LONG TERM GOAL #4   Title wife will demo understanding how best to cue pt for verbal expression in 3 sessions     Baseline not demonstrated    Time 12    Period Weeks    Status On-going    Target Date 04/05/21              Plan - 01/30/21  1117     Clinical Impression Statement [facility] presents today with severe aphasia (likely Wernicke's type) 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 Pt is exhibiting some improvements for spontaneous connected speech, functional naming, and multimodal communication via writing. SLP targeted functional naming and use of anomia compensations to aid verbal expression at home. Pt benefited from first letter prompts and phonemic cues to aid naming this session. Less frustration related to speech reported since last ST session. Pt will benefit from skilled ST targeting improved receptive and expressive language.    Speech Therapy Frequency 2x / week    Duration 12 weeks   or until Medicaid visits are exhausted   Treatment/Interventions Compensatory techniques;Functional tasks;Multimodal communcation approach;Cueing hierarchy;SLP instruction and feedback;Language facilitation;Cognitive reorganization;Compensatory strategies;Internal/external aids;Patient/family education    Potential to Achieve Goals Fair    Potential Considerations Severity of impairments;Financial resources    SLP Home Exercise Plan provided    Consulted and Agree with Plan of Care Patient;Family member/caregiver    Family Member Consulted wife             Patient will benefit from skilled therapeutic intervention in order to improve the following deficits and impairments:   Aphasia  Verbal apraxia    Problem List Patient Active Problem List   Diagnosis Date Noted   AKI (acute kidney injury) (HCC)    Benign essential HTN    Hyponatremia    Acute blood loss anemia    Urinary retention    Pressure injury of skin 11/17/2020   Respiratory arrest (HCC) 11/14/2020   Acute respiratory failure with hypoxia (HCC) 11/14/2020   Adverse effects of medication  11/14/2020   Hypotension 11/14/2020   Encounter for central line placement    Aortic dissection (HCC) 11/13/2020   Acute ischemic right MCA stroke (HCC) 11/13/2020   Agitation 11/13/2020   Atelectasis    Phlebitis    Polysubstance abuse (HCC)    Elevated troponin 11/07/2020   New onset left bundle branch block (LBBB) 11/07/2020   Protein-calorie malnutrition, severe 11/07/2020   Encephalopathy acute    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

## 2021-02-02 ENCOUNTER — Other Ambulatory Visit: Payer: Self-pay

## 2021-02-02 ENCOUNTER — Ambulatory Visit: Payer: Medicaid Other | Admitting: Occupational Therapy

## 2021-02-02 ENCOUNTER — Encounter: Payer: Self-pay | Admitting: Physical Therapy

## 2021-02-02 ENCOUNTER — Ambulatory Visit: Payer: Medicaid Other

## 2021-02-02 ENCOUNTER — Ambulatory Visit: Payer: Medicaid Other | Admitting: Physical Therapy

## 2021-02-02 VITALS — BP 108/98

## 2021-02-02 DIAGNOSIS — R2689 Other abnormalities of gait and mobility: Secondary | ICD-10-CM | POA: Diagnosis not present

## 2021-02-02 DIAGNOSIS — R2681 Unsteadiness on feet: Secondary | ICD-10-CM | POA: Diagnosis not present

## 2021-02-02 DIAGNOSIS — M6281 Muscle weakness (generalized): Secondary | ICD-10-CM | POA: Diagnosis not present

## 2021-02-02 DIAGNOSIS — I69354 Hemiplegia and hemiparesis following cerebral infarction affecting left non-dominant side: Secondary | ICD-10-CM

## 2021-02-02 DIAGNOSIS — R482 Apraxia: Secondary | ICD-10-CM | POA: Diagnosis not present

## 2021-02-02 DIAGNOSIS — R4701 Aphasia: Secondary | ICD-10-CM

## 2021-02-02 DIAGNOSIS — R41842 Visuospatial deficit: Secondary | ICD-10-CM | POA: Diagnosis not present

## 2021-02-02 DIAGNOSIS — R41840 Attention and concentration deficit: Secondary | ICD-10-CM

## 2021-02-02 DIAGNOSIS — R278 Other lack of coordination: Secondary | ICD-10-CM | POA: Diagnosis not present

## 2021-02-02 NOTE — Therapy (Signed)
[facility] [address] [place], [place], [address] Phone: [phone]   Fax:  [phone]  [name] Language Pathology Treatment  Patient Details  Name: Jesse [name] MRN: [id number] Date of Birth: [birth year] Referring Provider (SLP): Mariam Dollar, PA-C   Encounter Date: 02/02/2021   End of Session - 02/02/21 1022     Visit Number 7    Number of Visits 17    Date for SLP Re-Evaluation 04/05/21    Authorization Type Medicaid    Authorization - Visit Number 6    Authorization - Number of Visits 9    SLP Start Time 1107    SLP Stop Time  1145    SLP Time Calculation (min) 38 min    Activity Tolerance Patient tolerated treatment well             Past Medical History:  Diagnosis Date   Anxiety    Depression    Essential hypertension    Myocardial infarction (HCC)    S/P aortic dissection repair 11/05/2020   Straight graft replacement of ascending aorta Jesse proximal transverse aortic arch with re-suspension of native aortic valve Jesse open hemi arch distal anastomosis with aorta to right common carotid bypass Jesse aorta to right subclavian bypass   Stroke [facility])     Past Surgical History:  Procedure Laterality Date   ASCENDING AORTIC ROOT REPLACEMENT N/A 11/04/2020   Procedure: 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 WITH REPLACEMENT OF ASCENDING AORTA Jesse HEMIARCH USING HEMASHIELD PLATINUM GRAFT Jesse HEMASHIELD GOLD 14 X GRAFT, RESUSPENSION OF NATIVE VALVE, AORTA TO RIGHT CAROTID BYPASS, AORTA TO RIGHT SUBCLAVIAN BYPASS;  Surgeon: Purcell Nails, MD;  Location: [facility] OR;  Service: Open Heart Surgery;  Laterality: N/A;   TEE WITHOUT CARDIOVERSION N/A 11/04/2020   Procedure: TRANSESOPHAGEAL ECHOCARDIOGRAM (TEE);  Surgeon: Purcell Nails, MD;  Location: [facility] OR;  Service: Open Heart Surgery;  Laterality: N/A;    There were no vitals filed for this visit.   Subjective Assessment - 02/02/21 1149      Subjective 'It's all... (gesture for jumbled)"    Currently in Pain? No/denies                   ADULT SLP TREATMENT - 02/02/21 1021       General Information   Behavior/Cognition Alert;Cooperative;Pleasant mood;Requires cueing      Treatment Provided   Treatment provided Cognitive-Linquistic      Cognitive-Linquistic Treatment   Treatment focused on Aphasia;Apraxia    Skilled Treatment Pt Jesse wife indicated difficulty with recall Jesse processing related to OT tasks Jesse completion of daily tasks at home (making coffee). SLP discussed how cognitive processing may have been impacted during stroke, with comprehension deficits highlighted during evaluation. SLP briefly assessed reading comprehension, in which pt able to silently read Jesse complete 1-step directions with 36% accuracy. Performance was improved when high frequency content or sight words present (up, down, nose, left, right). Accuracy did improve when SLP read instruction aloud. Further assessment of auditory Jesse reading comprehension may be warranted. SLP suggested pt's wife say aloud steps to make coffee in morning Jesse see if patient able to demonstrate. SLP also recommended trialing sight words at home to further analyze reading comprehension.      Assessment / Recommendations / Plan   Plan Continue with current plan of care      Progression Toward Goals   Progression toward goals Progressing toward goals  SLP Education - 02/02/21 1156     Education Details comprehension    Person(s) Educated Patient;Spouse    Methods Explanation;Demonstration;Verbal cues    Comprehension Verbalized understanding;Returned demonstration;Verbal cues required;Need further instruction              SLP Short Term Goals - 02/02/21 1022       SLP SHORT TERM GOAL #1   Title pt will functionally name 7 members of his family/close friends correctly with modified independence in 3 session    Baseline 1;  01-19-21, 01-23-21, 01-26-21    Status Achieved    Target Date 02/13/21      SLP SHORT TERM GOAL #2   Title pt will answer high interest mod complex yes/no questions about familiar topics with 80% success in 3 sessions    Baseline simple Jesse min-mod complex questions 75%    Time 6    Period Weeks    Status On-going    Target Date 02/13/21      SLP SHORT TERM GOAL #3   Title pt will demo understanding of simple requests in a timely manner in 3 sessions    Baseline extra time consistently necessary; 01-19-21, 01-30-21    Time 6    Period Weeks    Status On-going    Target Date 02/13/21      SLP SHORT TERM GOAL #4   Title pt would demonstrate functional understanding in role-play situation/s for 80% success in 3 sessions    Baseline SLP needed to repeat Jesse slow rate of [name] to give pt success    Time 6    Period Weeks    Status On-going    Target Date 02/13/21      SLP SHORT TERM GOAL #5   Title pt will demo functional articulation (no neologism) in 3 simple conversational turns in 2 sessions    Baseline marked neologism in conversation    Time 6    Period Weeks    Status On-going    Target Date 02/13/21              SLP Long Term Goals - 02/02/21 1022       SLP LONG TERM GOAL #1   Title pt will demo understanding of verbal errors by restart or revision 80% of appropriate times, with modified independence, in 3 session    Time 12    Period Weeks    Status On-going    Target Date 04/05/21      SLP LONG TERM GOAL #2   Title pt will functionally communicate in 5 minutes simple conversation using compensations in 2 sessions    Baseline did not do so    Time 12    Period Weeks    Status On-going    Target Date 04/05/21      SLP LONG TERM GOAL #3   Title wife will demo understanding how best to cue pt for auditory comprehension in 3 sessions    Baseline not demonstrated    Time 12    Period Weeks    Status On-going    Target Date 04/05/21      SLP LONG TERM  GOAL #4   Title wife will demo understanding how best to cue pt for verbal expression in 3 sessions    Baseline not demonstrated    Time 12    Period Weeks    Status On-going    Target Date 04/05/21  Plan - 02/02/21 1022     Clinical Impression Statement Jesse [name], Jesse [name], Jesse writing. SLP targeted reading Jesse auditory comprehension this session, which was inconsistent for simple language. Increased difficulty noted for reading comprehension. Pt will benefit from skilled ST targeting improved receptive Jesse expressive language.    [name] Therapy Frequency 2x / week    Duration 12 weeks   or until Medicaid visits are exhausted   Treatment/Interventions Compensatory techniques;Functional tasks;Multimodal communcation approach;Cueing hierarchy;SLP instruction Jesse feedback;Language facilitation;Cognitive reorganization;Compensatory strategies;Internal/external aids;Patient/family education    Potential to Achieve Goals Fair    Potential Considerations Severity of impairments;Financial resources    SLP Home Exercise Plan provided    Consulted Jesse Agree with Plan of Care Patient;Family member/caregiver    Family Member Consulted wife             Patient will benefit from skilled therapeutic intervention in order to improve the following deficits Jesse impairments:   Aphasia  Verbal apraxia    Problem List Patient Active Problem List   Diagnosis Date Noted   AKI (acute kidney injury) (HCC)    Benign essential HTN    Hyponatremia    Acute blood loss anemia    Urinary retention    Pressure injury of skin 11/17/2020   Respiratory arrest (HCC) 11/14/2020   Acute respiratory failure with hypoxia (HCC) 11/14/2020   Adverse effects of medication 11/14/2020   Hypotension 11/14/2020    Encounter for central line placement    Aortic dissection (HCC) 11/13/2020   Acute ischemic right MCA stroke (HCC) 11/13/2020   Agitation 11/13/2020   Atelectasis    Phlebitis    Polysubstance abuse (HCC)    Elevated troponin 11/07/2020   New onset left bundle branch block (LBBB) 11/07/2020   Protein-calorie malnutrition, severe 11/07/2020   Encephalopathy acute    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 [place], [place], [address] Phone: [phone]   Fax:  [phone]   Name: Jesse [name] MRN: [id number] Date of Birth: [birth year]

## 2021-02-02 NOTE — Therapy (Signed)
[facility] [address] [place], [place], [address] Phone: [phone]   Fax:  [phone]  Physical Therapy Treatment  Patient Details  Name: Jesse [name] MRN: [id number] Date of Birth: 1975-09-11 Referring Provider (PT): [place] (follow up with Frann Rider, NP)   Encounter Date: 02/02/2021   PT End of Session - 02/02/21 1023     Visit Number 5    Number of Visits 7    Date for PT Re-Evaluation 02/20/21    Authorization Type Medicaid [employer]-auth requested    PT Start Time [phone]    PT Stop Time 1017    PT Time Calculation (min) 39 min    Activity Tolerance Patient tolerated treatment well             Past Medical History:  Diagnosis Date   Anxiety    Depression    Essential hypertension    Myocardial infarction ([place])    S/P aortic dissection repair 11/05/2020   Straight graft replacement of ascending aorta and proximal transverse aortic arch with re-suspension of native aortic valve and open hemi arch distal anastomosis with aorta to right common carotid bypass and aorta to right subclavian bypass   Stroke [facility])     Past Surgical History:  Procedure Laterality Date   ASCENDING AORTIC ROOT REPLACEMENT N/A 11/04/2020   Procedure: 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 WITH REPLACEMENT OF ASCENDING AORTA AND HEMIARCH USING HEMASHIELD PLATINUM 26MM GRAFT AND HEMASHIELD GOLD 14 X 8MM GRAFT, RESUSPENSION OF NATIVE VALVE, AORTA TO RIGHT CAROTID BYPASS, AORTA TO RIGHT SUBCLAVIAN BYPASS;  Surgeon: Rexene Alberts, MD;  Location: [facility];  Service: Open Heart Surgery;  Laterality: N/A;   TEE WITHOUT CARDIOVERSION N/A 11/04/2020   Procedure: TRANSESOPHAGEAL ECHOCARDIOGRAM (TEE);  Surgeon: Rexene Alberts, MD;  Location: [facility];  Service: Open Heart Surgery;  Laterality: N/A;    Vitals:   02/02/21 0951  BP: (!) 108/98 at beginning of session.  120/82 at end of session.     Subjective Assessment -  02/02/21 0938     Subjective Pt worked with wife moving things in a house all day and reports feeling fatigued in afternoon.  Pt's wife reports BP has been high in the afternoons until he gets his second dose of BP meds in evenings. Pt reports continued numbness in Left foot and right hand.    Patient Stated Goals Pt's goals for therapy are to "get back to me".  He wants to go fishing, long distances.                  Performed HEP below with education on progressing exercises. Exercises Sit to Stand Without Arm Support - 2 x daily - 7 x weekly - 2 sets - 10 reps;  Added 10lb wts. Squat - 2 x daily - 7 x weekly - 2 sets - 10 reps;  Added 10lb wts. Standing Single Leg Stance with Counter Support - 2 x daily - 7 x weekly - 2 sets - 3 reps - 15 sec hold;  cues for technique Standing Tandem Balance with Counter Support - 1-2 x daily - 5 x weekly - 2 sets - 2 reps - 30 hold;  progress to full tandem stance for 30 sec.            PT Education - 02/02/21 1000     Education Details Education on how to progress HEP with adding weight, time, and 3/4 stance to full tandem  stance.  Discussed scheduling and progress of functional mobility tasks at home.    Person(s) Educated Patient;Spouse    Methods Explanation    Comprehension Verbalized understanding;Returned demonstration;Need further instruction;Verbal cues required              PT Short Term Goals - 01/26/21 1019       PT SHORT TERM GOAL #1   Title Pt will perform HEP with family supervision for improved strength, balance, transfers, and gait.  TARGET  01/30/2021    Baseline Met, 01/26/21    Time 3    Period Weeks    Status Achieved      PT SHORT TERM GOAL #2   Title Pt will perform sit<>stand transfers, at least 5 reps minimal to no UE support and independently for improved functional strength.    Baseline Met 14.81 sec, without UE support. 01/26/21    Time 3    Period Weeks    Status Achieved      PT SHORT TERM  GOAL #3   Title Pt will improve TUG score to less than or equal to 15 sec for decreased fall risk.    Baseline Met, 12.18 sec (>13.5 sec indicates increased fall risk) 01/26/21    Time 3    Period Weeks    Status Achieved      PT SHORT TERM GOAL #4   Title Pt will improve DGI score to at least 14/24 to decrease fall risk.    Baseline Met, 22/24 (scores <19/24 indicate increased fall risk) 01/26/21.    Time 3    Period Weeks    Status Achieved      PT SHORT TERM GOAL #5   Title Pt/wife will verbalize understanding of fall prevention in the home environment.    Baseline Met, 01/26/21    Time 3    Period Weeks    Status Achieved               PT Long Term Goals - 01/05/21 2139       PT LONG TERM GOAL #1   Title Pt will perform progression of HEP with family supervision for improved strength, balance, transfers, and gait.    Baseline no current HEP    Time 6    Period Weeks    Status New      PT LONG TERM GOAL #2   Title Pt will improve gait velocity to at least 2.62 ft/sec for improved gait efficiency and safety.    Baseline 2.23 ft/sec    Time 6    Period Weeks    Status New      PT LONG TERM GOAL #3   Title Pt will improve TUG score to less than or equal to 13.5 sec for decreased fall risk.    Baseline 22.84 sec    Time 6    Period Weeks    Status New      PT LONG TERM GOAL #4   Title Pt will improve DGI score to at least 19/24 to decrease fall risk.    Baseline 11/24    Time 6    Period Weeks    Status New      PT LONG TERM GOAL #5   Title Pt will ambulate at least 1000 ft, indoors and outdoors, no device, independently for improved household and community gait for activities with children and family.    Baseline Currently min guard for 150 ft/indoor distances    Time 6  Period Weeks    Status New                   Plan - 02/02/21 1026     Clinical Impression Statement Pt reports continued fatigue at mid afternoon limits functional  mobility at home.  Pt balance continues to be limited with pt contining to report numbness in Left foot.  Pt tolerated and performed with intermittent UE support updated balance exercises from HEP this session.    Personal Factors and Comorbidities Comorbidity 3+    Comorbidities PMH: Anxiety, Depression, Essential hypertension    Examination-Activity Limitations Locomotion Level;Transfers;Sit;Stairs;Stand;Dressing;Toileting;Caring for Others    Examination-Participation Restrictions Occupation;Community Activity;Driving;Interpersonal Relationship    Stability/Clinical Decision Making Unstable/Unpredictable   postural hypotension, low blood pressure   Rehab Potential Good    PT Frequency 1x / week    PT Duration 6 weeks   plus eval   PT Treatment/Interventions ADLs/Self Care Home Management;Electrical Stimulation;DME Instruction;Gait training;Stair training;Functional mobility training;Therapeutic activities;Therapeutic exercise;Balance training;Neuromuscular re-education;Passive range of motion;Patient/family education;Orthotic Fit/Training;Vestibular    PT Next Visit Plan progress HEP to standing balance on compliant surface.  gait with head turns/counter balance exercises.  Need to monitor vitals, as pt has been experiencing variable BP    PT Home Exercise Plan [id number]    Consulted and Agree with Plan of Care Patient;Family member/caregiver    Family Member Consulted wife             Patient will benefit from skilled therapeutic intervention in order to improve the following deficits and impairments:  Abnormal gait, Difficulty walking, Decreased balance, Decreased mobility, Decreased strength, Postural dysfunction  Visit Diagnosis: Unsteadiness on feet  Other abnormalities of gait and mobility  Muscle weakness (generalized)     Problem List Patient Active Problem List   Diagnosis Date Noted   AKI (acute kidney injury) ([place])    Benign essential HTN    Hyponatremia    Acute  blood loss anemia    Urinary retention    Pressure injury of skin 11/17/2020   Respiratory arrest ([place]) 11/14/2020   Acute respiratory failure with hypoxia (HCC) 11/14/2020   Adverse effects of medication 11/14/2020   Hypotension 11/14/2020   Encounter for central line placement    Aortic dissection ([place]) 11/13/2020   Acute ischemic right MCA stroke ([place]) 11/13/2020   Agitation 11/13/2020   Atelectasis    Phlebitis    Polysubstance abuse ([place])    Elevated troponin 11/07/2020   New onset left bundle branch block (LBBB) 11/07/2020   Protein-calorie malnutrition, severe 11/07/2020   Encephalopathy acute    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 [place], [place], [address] Phone: [phone]   Fax:  [phone]  Name: Jesse [name] MRN: [id number] Date of Birth: June 06, 1975

## 2021-02-02 NOTE — Therapy (Signed)
[facility] [address] [place], [place], [address] Phone: [phone]   Fax:  [phone]  Occupational Therapy Treatment  Patient Details  Name: Jesse [name] MRN: [id number] Date of Birth: 1976-02-23 Referring Provider (OT): f/u Frann Rider, NP   Encounter Date: 02/02/2021   OT End of Session - 02/02/21 1124     Visit Number 5    Number of Visits 7    Date for OT Re-Evaluation 03/03/21   6 visits over 8 weeks   Authorization Type [employer] Medicaid    Authorization Time Period 27 combined, Auth req'd    OT Start Time 1017    OT Stop Time 1105    OT Time Calculation (min) 48 min    Activity Tolerance Patient tolerated treatment well    Behavior During Therapy [facility] for tasks assessed/performed             Past Medical History:  Diagnosis Date   Anxiety    Depression    Essential hypertension    Myocardial infarction ([place])    S/P aortic dissection repair 11/05/2020   Straight graft replacement of ascending aorta and proximal transverse aortic arch with re-suspension of native aortic valve and open hemi arch distal anastomosis with aorta to right common carotid bypass and aorta to right subclavian bypass   Stroke [facility])     Past Surgical History:  Procedure Laterality Date   ASCENDING AORTIC ROOT REPLACEMENT N/A 11/04/2020   Procedure: 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 WITH REPLACEMENT OF ASCENDING AORTA AND HEMIARCH USING HEMASHIELD PLATINUM 26MM GRAFT AND HEMASHIELD GOLD 14 X 8MM GRAFT, RESUSPENSION OF NATIVE VALVE, AORTA TO RIGHT CAROTID BYPASS, AORTA TO RIGHT SUBCLAVIAN BYPASS;  Surgeon: Rexene Alberts, MD;  Location: [facility];  Service: Open Heart Surgery;  Laterality: N/A;   TEE WITHOUT CARDIOVERSION N/A 11/04/2020   Procedure: TRANSESOPHAGEAL ECHOCARDIOGRAM (TEE);  Surgeon: Rexene Alberts, MD;  Location: [facility];  Service: Open Heart Surgery;  Laterality: N/A;    There were no vitals  filed for this visit.   Subjective Assessment - 02/02/21 1025     Subjective  Everything is jumbled (re: making coffee some mornings)    Patient is accompanied by: Family member   WIFE   Pertinent History Anxiety, Depression, Essential hypertension.    Limitations Global Aphasia, Fall, *foley catheter*    Patient Stated Goals unable to state d/t aphasia    Currently in Pain? Yes    Pain Location Hand    Pain Orientation Right    Pain Descriptors / Indicators Pins and needles;Numbness    Pain Type Acute pain    Pain Frequency Constant    Aggravating Factors  cold, bumping into it    Pain Relieving Factors nothing             Issued and discussed visual scanning strategies for Rt field cut/inattention?  Recommended going with wife to grocery store and/or other stores and look for items.  Issued green putty for Lt hand strengthening - pt has met this goal, however still slightly weak.   Re-assessed BUE AROM and strength - LUE appears WFL's therefore revised STG #1 and now goal is met.   Assessed goals as able - see below.   Began 65 pc puzzle (therapist initially thought it was 24 pc puzzle) - pt required max cues and mod assist to finish border d/t decreased visual/perceptual and cognitive skills.  Unable to finish d/t time constraints. Recommended beginning  with 12 pc puzzle and working up to 24 pc puzzle for home practice.                      OT Education - 02/02/21 1023     Education Details visual scanning strategies, putty HEP    Person(s) Educated Patient;Spouse    Methods Explanation;Demonstration;Handout    Comprehension Verbalized understanding;Returned demonstration              OT Short Term Goals - 02/02/21 1125       OT SHORT TERM GOAL #1   Title Pt will be independent with HEP targeting coordination and LUE grip    Baseline not issued at eval    Time 4    Period Weeks    Status Achieved    Target Date 02/02/21      OT SHORT TERM  GOAL #2   Title Pt and caregiver will verbalize understanding of visual strategies for increasing independence with ADLs and IADLs.    Baseline not reviewed at eval    Time 4    Period Weeks    Status Achieved      OT SHORT TERM GOAL #3   Title Pt will report shaving self with good safety awareness and with distant supervision only.    Baseline spouse currently shaving/cutting hair    Time 4    Period Weeks    Status On-going   has not yet attempted - recommended electric razor d/t on blood thinner     OT SHORT TERM GOAL #4   Title Pt will perform simple warm meal prep with supervision and good safety awareness (i.e. scrambled eggs, grilled cheese)    Baseline not currently performing    Time 4    Period Weeks    Status New               OT Long Term Goals - 02/02/21 1127       OT LONG TERM GOAL #1   Title Pt will be independent with any updated HEPs    Baseline not issued at eval    Time 8    Period Weeks    Status Deferred      OT LONG TERM GOAL #2   Title Pt will perform 9 hole peg test in 48 seconds or less with BUE for increasing fine motor coordination    Baseline L 51.47s, R 58.56s    Time 8    Period Weeks    Status Achieved   RT = 31 sec, Lt = 29.68 sec     OT LONG TERM GOAL #3   Title Pt will improve grip strength in LUE to 66 lbs or greater.    Baseline L 61.9, R 73.8    Time 8    Period Weeks    Status Achieved   Lt = 67.6 lbs (Rt = 78.2 lbs)     OT LONG TERM GOAL #4   Title Pt will perform simple warm meal prep and/or light home management task with mod I and good safety awareness    Baseline not completing    Time 8    Period Weeks    Status New      OT LONG TERM GOAL #5   Title Pt will perform environmental scanning with 90% accuracy    Baseline did not assess at eval, left inattention    Time 8    Period Weeks    Status On-going  Plan - 02/02/21 1128     Clinical Impression Statement Pt has met 2 STGs and  2 LTG's at this time. Pt has improved with bilateral coordination and grip strength. Pt continues to have visual/perceptual impairments    OT Occupational Profile and History Problem Focused Assessment - Including review of records relating to presenting problem    Occupational performance deficits (Please refer to evaluation for details): ADL's;IADL's;Work;Leisure    Body Structure / Function / Physical Skills FMC;ADL;Decreased knowledge of use of DME;Strength;GMC;Dexterity;UE functional use;IADL;Vision;Mobility    Cognitive Skills Understand;Attention    Rehab Potential Good    Clinical Decision Making Limited treatment options, no task modification necessary    Comorbidities Affecting Occupational Performance: None    Modification or Assistance to Complete Evaluation  No modification of tasks or assist necessary to complete eval    OT Frequency 1x / week    OT Duration 8 weeks   6 visits over 8 weeks   OT Treatment/Interventions Self-care/ADL training;DME and/or AE instruction;Therapeutic activities;Therapeutic exercise;Cognitive remediation/compensation;Patient/family education;Visual/perceptual remediation/compensation;Passive range of motion;Neuromuscular education;Energy conservation    Plan practice cooking egg, environmental scanning, if time 12 pc puzzle    Consulted and Agree with Plan of Care Family member/caregiver;Patient    Family Member Consulted Gina, spouse             Patient will benefit from skilled therapeutic intervention in order to improve the following deficits and impairments:   Body Structure / Function / Physical Skills: [facility], ADL, Decreased knowledge of use of DME, Strength, GMC, Dexterity, UE functional use, IADL, Vision, Mobility Cognitive Skills: Understand, Attention     Visit Diagnosis: Hemiplegia and hemiparesis following cerebral infarction affecting left non-dominant side (HCC)  Attention and concentration deficit  Visuospatial deficit  Muscle  weakness (generalized)    Problem List Patient Active Problem List   Diagnosis Date Noted   AKI (acute kidney injury) ([place])    Benign essential HTN    Hyponatremia    Acute blood loss anemia    Urinary retention    Pressure injury of skin 11/17/2020   Respiratory arrest ([place]) 11/14/2020   Acute respiratory failure with hypoxia (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

## 2021-02-02 NOTE — Patient Instructions (Signed)
  VISUAL SCANNING STRATEGIES:   1. Look for the edge of objects (to the left and/or right) so that you make sure you are seeing all of an object 2. Turn your head when walking, scan from side to side, particularly in busy environments 3. Use an organized scanning pattern. It's usually easier to scan from top to bottom, and left to right (like you are reading) 4. Double check yourself 5. Use a line guide (like a blank piece of paper) or your finger when reading 6. If necessary, place brightly colored tape at end of table or work area as a reminder to always look until you see the tape.   Activities to try at home to encourage visual scanning:   1. Word searches 2. Mazes 3. Puzzles 4. Card games 5. Computer games and/or searches 6. Connect-the-dots  Activities for environmental (larger) scanning:  1. With supervision, scan for items in grocery store or drugstore.  Begin with a familiar store, then progress to a new store you've never been in before. Make sure you have supervision with this.  2. With supervision, tell a family member or caregiver when it is safe to cross a street after looking all directions and any side streets. However, do NOT cross street unless family member or caregiver is with you and says it is OK   1. Grip Strengthening (Resistive Putty)   Squeeze putty using thumb and all fingers left hand. Repeat _20___ times. Do __2__ sessions per day.

## 2021-02-02 NOTE — Patient Instructions (Signed)
Steps for making coffee:  1) Unscrew the coffee container (blue lid) 2) Spoon out the coffee into the coffee mug 3) Put the lid back on the coffee container  4) Unscrew the red lid of the creamer 5) Spoon out the creamer into coffee mug 6) Put lid back onto the creamer 7) Take lid off of the sugar container  8) Spoon out sugar into the coffee mug 9) Put lid back of sugar container 10) Pour water from the faucet into the smaller coffee mug 11) Microwave coffee mug for 1:30  12) Take out the mug out of the microwave  13) Mix the water into the mug with coffee, creamer, and sugar 14) Stir the contents

## 2021-02-02 NOTE — Patient Instructions (Addendum)
Exercises Sit to Stand Without Arm Support - 2 x daily - 7 x weekly - 2 sets - 10 reps;  Added 10lb wts. Squat - 2 x daily - 7 x weekly - 2 sets - 10 reps;  Added 10lb wts. Standing Single Leg Stance with Counter Support - 2 x daily - 7 x weekly - 2 sets - 3 reps - 15 sec hold;  cues for technique Standing Tandem Balance with Counter Support - 1-2 x daily - 5 x weekly - 2 sets - 2 reps - 30 hold;  progress to full tandem stance for 30 sec.

## 2021-02-03 ENCOUNTER — Encounter: Payer: Self-pay | Admitting: Adult Health

## 2021-02-03 ENCOUNTER — Ambulatory Visit: Payer: Medicaid Other | Admitting: Genetic Counselor

## 2021-02-03 ENCOUNTER — Ambulatory Visit: Payer: Medicaid Other | Admitting: Adult Health

## 2021-02-03 VITALS — BP 106/64 | HR 98 | Ht 76.0 in | Wt 138.3 lb

## 2021-02-03 DIAGNOSIS — Z9889 Other specified postprocedural states: Secondary | ICD-10-CM

## 2021-02-03 DIAGNOSIS — I10 Essential (primary) hypertension: Secondary | ICD-10-CM | POA: Diagnosis not present

## 2021-02-03 DIAGNOSIS — I63131 Cerebral infarction due to embolism of right carotid artery: Secondary | ICD-10-CM | POA: Diagnosis not present

## 2021-02-03 NOTE — Progress Notes (Signed)
[facility] [address] [place]. [place] [address] [phone]       HOSPITAL FOLLOW UP NOTE  [name] Date of Birth:  1975/05/10 Medical Record Number:  [id number]   Reason for Referral:  hospital stroke follow up    SUBJECTIVE:   CHIEF COMPLAINT:  Chief Complaint  Patient presents with   Follow-up    Rm 3 with wife here for hospital follow up. Pt reports he is still working with therapy. Reports speech has not return to baseline yet, left side strength has improved.     HISTORY:  [name] is a 45 y.o. male with history of anxiety and longstanding tobacco and alcohol use, not seeing doctor for 22 years admitted on 11/04/2020 for chest pain, confusion, headache, blurry vision and aphasia.  Initial CT head unremarkable.  CTA found to have aortic dissection requiring emergent repair by Dr. Cornelius Moras.  Complicated hospital course with CT head showed relatively large infarct with cytotoxic edema in posterior right MCA and MCA/PCA watershed territories with development of petechial hemorrhage. MRI large right MCA infarct with mild cytotoxic edema and rightward midline shift with tiny punctate bilateral cerebral and cerebellar embolic infarcts.  MRA head/neck interval revascularization of right ICA and MCA and right aorto to subclavian bypass with patent right carotid artery.  EF 55 to 60%.  On 7/15, patient increasing O2 requirements and reintubated with postop course additionally complicated by acute blood loss anemia, coagulopathy, left antecubital superficial venous thrombosis (started on Keflex, Vanco and heparin drip) and persistent hyperactive delirium requiring Precedex and eventually transition to Klonopin, Seroquel, valproic acid and Ativan as needed.  EEG 7/16 focal right hemispheric slowing and mild generalized slowing but no definite epileptiform activity.  LDL 49.  A1c 5.2.  Started on aspirin 81 mg daily per CTS. On 7/20, rapid response called for  anisicoria.  Repeat CT head 7/20 interval development of petechial hemorrhage associated with evolving posterior right MCA and MCA/PCA watershed territory infarct with cytotoxic edema and mass-effect mildly progressed with approximately 4 mm of leftward midline shift at the foramen of Monro (previously 3 mm when remeasured).   Eventually transferred to CIR on 7/21 as mobility declining any more scheduled rehab routinely improved delirium.  On 7/23, patient found to be apneic with pulse and blood pressure 72/54 and unresponsive and code initiated requiring reintubation likely due to aspiration pneumonia as well as COVID-positive state.  Extubated 7/23 and repeat CT head showed evolving right MCA territory infarct with decreasing edema.  Poor p.o. intake therefore core track placed.  Continue to have issues with poor attention, global aphasia with cognitive deficits and unsteady gait and was transferred back to CIR on 8/4.  During CIR admission, mood stable and weaned off Seroquel, renal status improved, urinary retention requiring Foley placement, improvement of p.o. intake with tube feeds discontinued 8/6 on dysphagia 2 diet and completed course of antibiotics for COVID-pneumonia.  Is eventually discharged home on 12/11/2020 with home health therapies.   HPI:   Today, 02/03/2021, patient is being seen for hospital follow-up accompanied by his wife who provides majority of history  Reports residual:  Expressive aphasia -has been gradually improving.  Currently working with SLP. Per wife, able to participate in more conversations Denies residual receptive aphasia  Gait impairment with imbalance -gradually improving.  Currently working with PT Pins/needles right hand and left foot - painful and sensitive. Wife questioning if this is interfering with imbalance Vision impairment - continued difficulty seeing the ground  or reading. Will have to tilt his head to help compensate.   Worsening depression/anxiety:  started on mirtazapine by PCP but not yet started (due to concern of interaction with Seroquel).  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  Wife is in the process of applying for Social Security disability.  He has since been approved for Medicaid.  Compliant on aspirin and Crestor without side effects.  Blood pressure today 106/64.  Routinely monitors at home per wife, has been on the lower side especially after medications.  No further concerns at this time     PERTINENT IMAGING  CT HEAD 11/04/2020 IMPRESSION: No acute intracranial hemorrhage. Question subtle small loss of gray-white differentiation in the right insular region (ASPECT score is 9).  11/06/2020 IMPRESSION: 1. Relatively large infarct with cytotoxic edema in the posterior Right MCA and MCA/PCA watershed territories. Mild mass effect including trace leftward midline shift. No hemorrhagic transformation. 2. Preserved gray-white matter differentiation elsewhere.  11/12/2020 IMPRESSION: In comparison to CT head from 11/06/2020, interval development of petechial hemorrhage associated with the evolving posterior right MCA and MCA/PCA watershed territory infarct. No mass occupying hemorrhagic transformation. Cytotoxic edema and mass effect is mildly progressed with approximately 4 mm of leftward midline shift at the foramen of Monro (previously 3 mm when remeasured).  11/14/2020 IMPRESSION: Acute infarct in the posterior right MCA territory. There is progressive gyral intermediate density throughout the infarct with significant change from 2 days ago. It is unclear if this is evolution of infarct with laminar necrosis versus petechial hemorrhage related to hemorrhagic transformation. Differentiation of these 2 findings  could be best performed with MRI.  11/18/2020 IMPRESSION: Evolving posterior right MCA territory infarction with decreased edema and mild mass effect. No new hemorrhage.   New acute or subacute small bilateral cerebellar infarcts. Some may have been subtly present on the prior CT.   CT ANGIO  IMPRESSION: Partially imaged type A aortic dissection involving the ascending aorta and transverse portion but not the descending aorta.   Dissection involves innominate artery with thrombosis of false lumen. This extends into the right common carotid with narrowing of the true lumen to a minimal diameter of 1.5 mm. Minimal enhancement within the right cervical and intracranial ICA.   Reconstitution at the right ICA terminus. No proximal intracranial vessel occlusion. 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   MR BRAIN MRA HEAD/NECK IMPRESSION: MRI HEAD IMPRESSION: 1. 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 Associated regional mass effect with 5 mm of right-to-left shift, slightly increased from prior. 2. Additional scattered subcentimeter acute ischemic infarcts involving the bilateral cerebral hemispheres and right cerebellum, embolic in nature. No associated hemorrhage or mass effect. 3. No other acute intracranial abnormality.   MRA HEAD IMPRESSION: 1. Interval re-establishment of flow within the right ICA, now patent to the terminus. Right MCA well perfused, with no visible persistent downstream occlusion evident by MRA. 2. Otherwise stable and unremarkable intracranial MRA.   MRA NECK IMPRESSION: 1. 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 2. Right carotid artery system supplied via the right subclavian bypass, and is  now widely  patent without residual dissection or other abnormality. 3. Left carotid artery system supplied via the native aortic arch, and remains widely patent without stenosis or other abnormality. 4. 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 Follow-up examination with dedicated CTA could be performed for further evaluation of this finding as warranted. 5. Both vertebral arteries remain widely patent within the neck. Right vertebral artery dominant.      ROS:   14 system review of systems performed and negative with exception of those listed in HPI  PMH:  Past Medical History:  Diagnosis Date   Anxiety    Depression    Essential hypertension    Myocardial infarction (HCC)    S/P aortic dissection repair 11/05/2020   Straight graft replacement of ascending aorta and proximal transverse aortic arch with re-suspension of native aortic valve and open hemi arch distal anastomosis with aorta to right common carotid bypass and aorta to right subclavian bypass   Stroke (HCC)     PSH:  Past Surgical History:  Procedure Laterality Date   ASCENDING AORTIC ROOT REPLACEMENT N/A 11/04/2020   Procedure: 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 WITH REPLACEMENT OF ASCENDING AORTA AND HEMIARCH USING HEMASHIELD PLATINUM GRAFT AND HEMASHIELD GOLD 14 X GRAFT, RESUSPENSION OF NATIVE VALVE, AORTA TO RIGHT CAROTID BYPASS, AORTA TO RIGHT SUBCLAVIAN BYPASS;  Surgeon: Purcell Nails, MD;  Location: [facility] OR;  Service: Open Heart Surgery;  Laterality: N/A;   TEE WITHOUT CARDIOVERSION N/A 11/04/2020   Procedure: TRANSESOPHAGEAL ECHOCARDIOGRAM (TEE);  Surgeon: Purcell Nails, MD;  Location: [facility] OR;  Service: Open Heart Surgery;  Laterality: N/A;    Social History:  Social History   Socioeconomic History   Marital status: Married    Spouse name: Not on file   Number of children: Not on file    Years of education: Not on file   Highest education level: 9th grade  Occupational History   Not on file  Tobacco Use   Smoking status: Former    Types: Cigarettes    Quit date: 11/04/2020    Years since quitting: 0.2   Smokeless tobacco: Never  Vaping Use   Vaping Use: Never used  Substance and Sexual Activity   Alcohol use: Yes    Alcohol/week: 2.0 standard drinks    Types: 2 Cans of beer per week   Drug use: Not Currently   Sexual activity: Yes  Other Topics Concern   Not on file  Social History Narrative   Not on file   Social Determinants of Health   Financial Resource Strain: Not on file  Food Insecurity: Not on file  Transportation Needs: Not on file  Physical Activity: Not on file  Stress: Not on file  Social Connections: Not on file  Intimate Partner Violence: Not on file    Family History: No family history on file.  Medications:   Current Outpatient Medications on File Prior to Visit  Medication Sig Dispense Refill   acetaminophen (TYLENOL) 325 MG tablet Take 1-2 tablets (325-650 mg total) by mouth every 4 (four) hours as needed for mild pain.     amLODipine (NORVASC) 5 MG tablet Take 2.5 mg by mouth daily.     aspirin 81 MG chewable tablet Chew 1 tablet (81 mg total) by mouth daily.     carvedilol (COREG) 3.125 MG tablet Take 1 tablet (3.125 mg total) by mouth 2 (two) times daily with a  meal. 60 tablet 0   ferrous sulfate 325 (65 FE) MG tablet Take 1 tablet (325 mg total) by mouth daily. 30 tablet 3   pantoprazole sodium (PROTONIX) 40 mg/20 mL PACK Dissolve 1 packet in 20 mLs (40 mg total) and take by mouth daily. 30 mL 0   polyethylene glycol (MIRALAX / GLYCOLAX) 17 g packet Take 17 g by mouth daily as needed for moderate constipation. 14 each 0   QUEtiapine (SEROQUEL) 50 MG tablet TAKE 1 TABLET BY MOUTH AT BEDTIME. AND 1/2 TABLET DURING THE DAY AS NEEDED FOR AGITATION 45 tablet 0   rosuvastatin (CRESTOR) 20 MG tablet Take 1 tablet (20 mg total) by mouth  daily. 30 tablet 11   tamsulosin (FLOMAX) 0.4 MG CAPS capsule Take 2 capsules (0.8 mg total) by mouth daily after supper. 60 capsule 0   Zinc Sulfate 220 (50 Zn) MG TABS Take 1 tablet (220 mg total) by mouth daily. 90 tablet 0   mirtazapine (REMERON) 7.5 MG tablet Take 1 tablet (7.5 mg total) by mouth at bedtime. (Patient not taking: Reported on 02/03/2021) 30 tablet 0   No current facility-administered medications on file prior to visit.    Allergies:  No Known Allergies    OBJECTIVE:  Physical Exam  Vitals:   02/03/21 1004  BP: 106/64  Pulse: 98  SpO2: 98%  Weight: 138 lb 5 oz (62.7 kg)  Height: 6\' 4"  (1.93 m)   Body mass index is 16.84 kg/m. No results found.   General: Frail very pleasant middle-age Caucasian male, seated, in no evident distress Head: head normocephalic and atraumatic.   Neck: supple with no carotid or supraclavicular bruits Cardiovascular: regular rate and rhythm, no murmurs Musculoskeletal: no deformity Skin:  no rash/petichiae Vascular:  Normal pulses all extremities   Neurologic Exam Mental Status: Awake and fully alert.  Moderate to severe expressive aphasia.  Follows commands without difficulty.  Oriented to place and time. Recent memory impaired and remote memory intact. Attention span, concentration and fund of knowledge appropriate during visit although wife providing majority of history. Mood and affect appropriate.  Cranial Nerves: Fundoscopic exam reveals sharp disc margins. Pupils equal, briskly reactive to light. Extraocular movements full without nystagmus. Visual fields lower visual field impairment bilaterally with subjective decreased vision right eye. Hearing intact. Facial sensation intact. Face, tongue, palate moves normally and symmetrically.  Motor: Normal bulk and tone. Normal strength in all tested extremity muscles Sensory.: intact to touch , pinprick , position and vibratory sensation.  Subjective right hand and left foot  pins/needle Coordination: Rapid alternating movements normal in all extremities. Finger-to-nose and heel-to-shin performed accurately bilaterally. Gait and Station: Arises from chair without difficulty. Stance is normal. Gait demonstrates normal stride length and mild imbalance with slight favoring of leg leg without use of assistive device.  Unable to perform tandem walk and heel toe Reflexes: 1+ and symmetric. Toes downgoing.     NIHSS  4 Modified Rankin  3      ASSESSMENT: Jesse [name] is a 45 y.o. 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 Vascular risk factors include HTN, tobacco use, EtOH use, and s/p aortic dissection repair.      PLAN:  Right MCA/PCA infarct:  Residual deficit: Expressive aphasia, gait impairment with imbalance, visual deficit and right hand and left foot numbness/tingling.  Continue participation with neuro rehab PT/OT/SLP.  Long discussion  regarding typical recovery time and possible further improvement.  Wife in the process of applying for Social Security disability Depression/anxiety/insomnia: Worsened post stroke.  PCP initiated mirtazapine in addition to Seroquel 25 mg nightly and as needed 25 mg daily but not recently needed.  Continue to be monitored by PCP Continue aspirin 81 mg daily  and Crestor 20 mg daily for secondary stroke prevention.  Discussed secondary stroke prevention measures and importance of close PCP follow up for aggressive stroke risk factor management. I have gone over the pathophysiology of stroke, warning signs and symptoms, risk factors and their management in some detail with instructions to go to the closest emergency room for symptoms of concern. HTN: BP goal <130/90.  Stable on low side on current regimen per PCP -advised to discuss current medications with PCP for possible need of a dose  adjustment S/p aortic dissection repair: Routinely followed by cardiology.  Recently released from cardiothoracic surgery.  Currently being worked up for marfanoid or connective tissue disorder though cardiology Tobacco use: Discussed importance of complete tobacco cessation.  He verbalized understanding and is in the process of quitting.  He was advised to follow-up with PCP for further assistance    Follow up in 3 months or call earlier if needed   CC:  [facility] provider: Dr. Pearlean Brownie PCP: Georganna Skeans, MD    I spent 59 minutes of face-to-face and non-face-to-face time with patient and wife.  This included previsit chart review including review of recent extensive hospitalization, lab review, study review, electronic health record documentation, patient and wife education regarding recent stroke including etiology as well as review of imaging, secondary stroke prevention measures and importance of managing stroke risk factors, residual deficits and typical recovery time and answered all other questions to patient and wife's satisfaction  Ihor Austin, AGNP-BC  [facility] [address] [place], [place] [address]  Phone [phone] Fax [phone] Note: This document was prepared with digital dictation and possible smart phrase technology. Any transcriptional errors that result from this process are unintentional.

## 2021-02-03 NOTE — Patient Instructions (Signed)
Continue aspirin 81 mg daily  and Crestor 20mg  daily  for secondary stroke prevention  Continue to follow up with PCP regarding cholesterol and blood pressure management  Maintain strict control of hypertension with blood pressure goal below 130/90 and cholesterol with LDL cholesterol (bad cholesterol) goal below 70 mg/dL.   Continue working with therapies for hopeful ongoing improvement      Followup in the future with me in 3 months or call earlier if needed       Thank you for coming to see at [facility]. I hope we have been able to provide you high quality care today.  You may receive a patient satisfaction survey over the next few weeks. We would appreciate your feedback and comments so that we may continue to improve ourselves and the health of our patients.

## 2021-02-05 ENCOUNTER — Ambulatory Visit
Admission: RE | Admit: 2021-02-05 | Discharge: 2021-02-05 | Disposition: A | Discharge status: Home / Self Care | Payer: Medicaid Other | Source: Ambulatory Visit | Attending: Thoracic Surgery (Cardiothoracic Vascular Surgery) | Admitting: Thoracic Surgery (Cardiothoracic Vascular Surgery)

## 2021-02-05 ENCOUNTER — Other Ambulatory Visit: Payer: Medicaid Other

## 2021-02-05 DIAGNOSIS — I700 Atherosclerosis of aorta: Secondary | ICD-10-CM | POA: Diagnosis not present

## 2021-02-05 DIAGNOSIS — Q676 Pectus excavatum: Secondary | ICD-10-CM | POA: Diagnosis not present

## 2021-02-05 DIAGNOSIS — I712 Thoracic aortic aneurysm, without rupture, unspecified: Secondary | ICD-10-CM

## 2021-02-05 DIAGNOSIS — Q272 Other congenital malformations of renal artery: Secondary | ICD-10-CM | POA: Diagnosis not present

## 2021-02-05 DIAGNOSIS — Z01818 Encounter for other preprocedural examination: Secondary | ICD-10-CM | POA: Diagnosis not present

## 2021-02-05 MED ORDER — IOPAMIDOL (ISOVUE-370) INJECTION 76%
75.0000 mL | Freq: Once | INTRAVENOUS | Status: AC | PRN
  Administered 2021-02-05: 75 mL via INTRAVENOUS

## 2021-02-06 ENCOUNTER — Ambulatory Visit: Payer: Medicaid Other

## 2021-02-06 ENCOUNTER — Other Ambulatory Visit: Payer: Self-pay

## 2021-02-06 DIAGNOSIS — R4701 Aphasia: Secondary | ICD-10-CM | POA: Diagnosis not present

## 2021-02-06 DIAGNOSIS — R482 Apraxia: Secondary | ICD-10-CM

## 2021-02-06 DIAGNOSIS — R41840 Attention and concentration deficit: Secondary | ICD-10-CM | POA: Diagnosis not present

## 2021-02-06 DIAGNOSIS — M6281 Muscle weakness (generalized): Secondary | ICD-10-CM | POA: Diagnosis not present

## 2021-02-06 DIAGNOSIS — R2681 Unsteadiness on feet: Secondary | ICD-10-CM | POA: Diagnosis not present

## 2021-02-06 DIAGNOSIS — R278 Other lack of coordination: Secondary | ICD-10-CM | POA: Diagnosis not present

## 2021-02-06 DIAGNOSIS — R41842 Visuospatial deficit: Secondary | ICD-10-CM | POA: Diagnosis not present

## 2021-02-06 DIAGNOSIS — I69354 Hemiplegia and hemiparesis following cerebral infarction affecting left non-dominant side: Secondary | ICD-10-CM | POA: Diagnosis not present

## 2021-02-06 DIAGNOSIS — R2689 Other abnormalities of gait and mobility: Secondary | ICD-10-CM | POA: Diagnosis not present

## 2021-02-06 NOTE — Therapy (Signed)
[facility] [address] [place], [place], [address] Phone: [phone]   Fax:  [phone]  Speech Language Pathology Treatment  Patient Details  Name: Jesse [name] MRN: [id number] Date of Birth: 1975/08/15 Referring Provider (SLP): Mariam Dollar, PA-C   Encounter Date: 02/06/2021   End of Session - 02/06/21 1017     Visit Number 8    Number of Visits 17    Date for SLP Re-Evaluation 04/05/21    Authorization Type Medicaid    Authorization - Visit Number 7    Authorization - Number of Visits 9    SLP Start Time 1017    SLP Stop Time  1103    SLP Time Calculation (min) 46 min    Activity Tolerance Patient tolerated treatment well             Past Medical History:  Diagnosis Date   Anxiety    Depression    Essential hypertension    Myocardial infarction (HCC)    S/P aortic dissection repair 11/05/2020   Straight graft replacement of ascending aorta and proximal transverse aortic arch with re-suspension of native aortic valve and open hemi arch distal anastomosis with aorta to right common carotid bypass and aorta to right subclavian bypass   Stroke [facility])     Past Surgical History:  Procedure Laterality Date   ASCENDING AORTIC ROOT REPLACEMENT N/A 11/04/2020   Procedure: 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 WITH REPLACEMENT OF ASCENDING AORTA AND HEMIARCH USING HEMASHIELD PLATINUM GRAFT AND HEMASHIELD GOLD 14 X GRAFT, RESUSPENSION OF NATIVE VALVE, AORTA TO RIGHT CAROTID BYPASS, AORTA TO RIGHT SUBCLAVIAN BYPASS;  Surgeon: Purcell Nails, MD;  Location: [facility] OR;  Service: Open Heart Surgery;  Laterality: N/A;   TEE WITHOUT CARDIOVERSION N/A 11/04/2020   Procedure: TRANSESOPHAGEAL ECHOCARDIOGRAM (TEE);  Surgeon: Purcell Nails, MD;  Location: [facility] OR;  Service: Open Heart Surgery;  Laterality: N/A;    There were no vitals filed for this visit.   Subjective Assessment - 02/06/21 1018      Subjective "it was a lot of... blood (work)"    Currently in Pain? No/denies                   ADULT SLP TREATMENT - 02/06/21 1016       General Information   Behavior/Cognition Alert;Cooperative;Pleasant mood;Requires cueing      Treatment Provided   Treatment provided Cognitive-Linquistic      Cognitive-Linquistic Treatment   Treatment focused on Aphasia;Apraxia    Skilled Treatment Pt's wife reported improved accuracy for sequencing coffee routine given step by step verbal instruction, although pt would get a step ahead. Pt's wife indicated he read some content words on television, which aided comprehension of show. SLP initiated RCBA-2 this session, with good reading comprehension exhibited for word level with errors noted x2 (thermos & tassel), which pt self-corrected one. Pt independently verbalized written words x7 (all one syllable). Pt required rare increasing to usual verbal and visual cues to correct articulation, apraxic errors as session and tasks progressed. SLP provided HEP and recommendations to target functional high frequency words at home.      Assessment / Recommendations / Plan   Plan Continue with current plan of care      Progression Toward Goals   Progression toward goals Progressing toward goals              SLP Education - 02/06/21 1203     Education  Details HEP, functional recommendations    Person(s) Educated Patient;Spouse    Methods Explanation;Demonstration;Verbal cues;Handout    Comprehension Verbalized understanding;Returned demonstration;Need further instruction              SLP Short Term Goals - 02/06/21 1205       SLP SHORT TERM GOAL #1   Title pt will functionally name 7 members of his family/close friends correctly with modified independence in 3 session    Baseline 1; 01-19-21, 01-23-21, 01-26-21    Status Achieved    Target Date 02/13/21      SLP SHORT TERM GOAL #2   Title pt will answer high interest mod complex  yes/no questions about familiar topics with 80% success in 3 sessions    Baseline simple and min-mod complex questions 75%    Time 6    Period Weeks    Status On-going    Target Date 02/13/21      SLP SHORT TERM GOAL #3   Title pt will demo understanding of simple requests in a timely manner in 3 sessions    Baseline extra time consistently necessary; 01-19-21, 01-30-21    Time 6    Period Weeks    Status On-going    Target Date 02/13/21      SLP SHORT TERM GOAL #4   Title pt would demonstrate functional understanding in role-play situation/s for 80% success in 3 sessions    Baseline SLP needed to repeat and slow rate of speech to give pt success    Time 6    Period Weeks    Status On-going    Target Date 02/13/21      SLP SHORT TERM GOAL #5   Title pt will demo functional articulation (no neologism) in 3 simple conversational turns in 2 sessions    Baseline marked neologism in conversation    Time 6    Period Weeks    Status On-going    Target Date 02/13/21              SLP Long Term Goals - 02/06/21 1205       SLP LONG TERM GOAL #1   Title pt will demo understanding of verbal errors by restart or revision 80% of appropriate times, with modified independence, in 3 session    Time 12    Period Weeks    Status On-going    Target Date 04/05/21      SLP LONG TERM GOAL #2   Title pt will functionally communicate in 5 minutes simple conversation using compensations in 2 sessions    Baseline did not do so    Time 12    Period Weeks    Status On-going    Target Date 04/05/21      SLP LONG TERM GOAL #3   Title wife will demo understanding how best to cue pt for auditory comprehension in 3 sessions    Baseline not demonstrated    Time 12    Period Weeks    Status On-going    Target Date 04/05/21      SLP LONG TERM GOAL #4   Title wife will demo understanding how best to cue pt for verbal expression in 3 sessions    Baseline not demonstrated    Time 12    Period  Weeks    Status On-going    Target Date 04/05/21              Plan - 02/06/21 1017  Clinical Impression Statement Jesse [name] [facility]") presents today with severe aphasia (likely Wernicke's type) and apraxia. Pt is exhibiting some improvements for spontaneous speech, reading comprehension, and writing. SLP targeted reading and auditory comprehension this session, which was more consistent for simple language. Some accurate verbalizations noted for 1-syllable words without cues. Pt will benefit from skilled ST targeting improved receptive and expressive language.    Speech Therapy Frequency 2x / week    Duration 12 weeks   or until Medicaid visits are exhausted   Treatment/Interventions Compensatory techniques;Functional tasks;Multimodal communcation approach;Cueing hierarchy;SLP instruction and feedback;Language facilitation;Cognitive reorganization;Compensatory strategies;Internal/external aids;Patient/family education    Potential to Achieve Goals Fair    Potential Considerations Severity of impairments;Financial resources    SLP Home Exercise Plan provided    Consulted and Agree with Plan of Care Patient;Family member/caregiver    Family Member Consulted wife             Patient will benefit from skilled therapeutic intervention in order to improve the following deficits and impairments:   Aphasia  Verbal apraxia    Problem List Patient Active Problem List   Diagnosis Date Noted   AKI (acute kidney injury) (HCC)    Benign essential HTN    Hyponatremia    Acute blood loss anemia    Urinary retention    Pressure injury of skin 11/17/2020   Respiratory arrest (HCC) 11/14/2020   Acute respiratory failure with hypoxia (HCC) 11/14/2020   Adverse effects of medication 11/14/2020   Hypotension 11/14/2020   Encounter for central line placement    Aortic dissection (HCC) 11/13/2020   Acute ischemic right MCA stroke (HCC) 11/13/2020   Agitation 11/13/2020   Atelectasis     Phlebitis    Polysubstance abuse (HCC)    Elevated troponin 11/07/2020   New onset left bundle branch block (LBBB) 11/07/2020   Protein-calorie malnutrition, severe 11/07/2020   Encephalopathy acute    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

## 2021-02-09 ENCOUNTER — Other Ambulatory Visit: Payer: Self-pay | Admitting: Physician Assistant

## 2021-02-09 ENCOUNTER — Ambulatory Visit: Payer: Medicaid Other

## 2021-02-09 ENCOUNTER — Other Ambulatory Visit: Payer: Self-pay | Admitting: Internal Medicine

## 2021-02-09 ENCOUNTER — Ambulatory Visit: Payer: Medicaid Other | Admitting: Occupational Therapy

## 2021-02-09 ENCOUNTER — Other Ambulatory Visit: Payer: Self-pay

## 2021-02-09 DIAGNOSIS — R482 Apraxia: Secondary | ICD-10-CM

## 2021-02-09 DIAGNOSIS — I69354 Hemiplegia and hemiparesis following cerebral infarction affecting left non-dominant side: Secondary | ICD-10-CM

## 2021-02-09 DIAGNOSIS — R41842 Visuospatial deficit: Secondary | ICD-10-CM

## 2021-02-09 DIAGNOSIS — R278 Other lack of coordination: Secondary | ICD-10-CM | POA: Diagnosis not present

## 2021-02-09 DIAGNOSIS — R2681 Unsteadiness on feet: Secondary | ICD-10-CM

## 2021-02-09 DIAGNOSIS — R41840 Attention and concentration deficit: Secondary | ICD-10-CM | POA: Diagnosis not present

## 2021-02-09 DIAGNOSIS — R2689 Other abnormalities of gait and mobility: Secondary | ICD-10-CM | POA: Diagnosis not present

## 2021-02-09 DIAGNOSIS — M6281 Muscle weakness (generalized): Secondary | ICD-10-CM

## 2021-02-09 DIAGNOSIS — R4701 Aphasia: Secondary | ICD-10-CM

## 2021-02-09 NOTE — Therapy (Signed)
[facility] [address] [place], [place], [address] Phone: [phone]   Fax:  [phone]  Occupational Therapy Treatment  Patient Details  Name: Jesse [name] MRN: [id number] Date of Birth: [birth year] Referring Provider (OT): f/u Frann Rider, NP   Encounter Date: 02/09/2021   OT End of Session - 02/09/21 1214     Visit Number 6    Number of Visits 7    Date for OT Re-Evaluation 03/03/21   6 visits over 8 weeks   Authorization Type [employer]    Authorization Time Period 27 combined, Auth req'd    OT Start Time 1015    OT Stop Time 1100    OT Time Calculation (min) 45 min    Activity Tolerance Patient tolerated treatment well    Behavior During Therapy [facility] for tasks assessed/performed             Past Medical History:  Diagnosis Date   Anxiety    Depression    Essential hypertension    Myocardial infarction ([place])    S/P aortic dissection repair 11/05/2020   Straight graft replacement of ascending aorta and proximal transverse aortic arch with re-suspension of native aortic valve and open hemi arch distal anastomosis with aorta to right common carotid bypass and aorta to right subclavian bypass   Stroke [facility])     Past Surgical History:  Procedure Laterality Date   ASCENDING AORTIC ROOT REPLACEMENT N/A 11/04/2020   Procedure: 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 WITH REPLACEMENT OF ASCENDING AORTA AND HEMIARCH USING HEMASHIELD PLATINUM 26MM GRAFT AND HEMASHIELD GOLD 14 X 8MM GRAFT, RESUSPENSION OF NATIVE VALVE, AORTA TO RIGHT CAROTID BYPASS, AORTA TO RIGHT SUBCLAVIAN BYPASS;  Surgeon: Rexene Alberts, MD;  Location: [place];  Service: Open Heart Surgery;  Laterality: N/A;   TEE WITHOUT CARDIOVERSION N/A 11/04/2020   Procedure: TRANSESOPHAGEAL ECHOCARDIOGRAM (TEE);  Surgeon: Rexene Alberts, MD;  Location: [place];  Service: Open Heart Surgery;  Laterality: N/A;    There were no vitals  filed for this visit.   Subjective Assessment - 02/09/21 1020     Subjective  Wife reports he has taken his shower seat out of the shower and showered yesterday without it    Patient is accompanied by: Family member   wife   Pertinent History Anxiety, Depression, Essential hypertension.    Limitations Global Aphasia, Fall, *foley catheter*    Patient Stated Goals unable to state d/t aphasia    Currently in Pain? Yes    Pain Location --   Rt hand, Lt foot   Pain Orientation Right;Left    Pain Descriptors / Indicators Numbness;Pins and needles    Pain Type Acute pain    Pain Onset More than a month ago    Pain Frequency Constant    Aggravating Factors  cold, bumping into it    Pain Relieving Factors nothing             Pt made scrambled egg w/ cues to id and look for items in unfamiliar kitchen; but also required cues to crack egg over bowl (vs. Sink), plan ahead, and general safety. Pt did remember to turn stove off but forgot to put eggs back in refrigerator.   Pt completed first 12 pc puzzle with mod to max cueing, 2nd 12 pc puzzle with min cueing, 3rd 12 pc puzzle with mod cues d/t compensating for a pc missing.   Pt/caregiver encouraged to practice 12 and 24  pc puzzles at home. Pt/caregiver also recommended to play Connect 4 at home for coordination picking up checkers, visual scanning, and cognition (strategy, planning ahead, etc)                       OT Short Term Goals - 02/09/21 1216       OT SHORT TERM GOAL #1   Title Pt will be independent with HEP targeting coordination and LUE grip    Baseline not issued at eval    Time 4    Period Weeks    Status Achieved    Target Date 02/02/21      OT SHORT TERM GOAL #2   Title Pt and caregiver will verbalize understanding of visual strategies for increasing independence with ADLs and IADLs.    Baseline not reviewed at eval    Time 4    Period Weeks    Status Achieved      OT SHORT TERM GOAL #3    Title Pt will report shaving self with good safety awareness and with distant supervision only.    Baseline spouse currently shaving/cutting hair    Time 4    Period Weeks    Status On-going   has not yet attempted - recommended electric razor d/t on blood thinner     OT SHORT TERM GOAL #4   Title Pt will perform simple warm meal prep with supervision and good safety awareness (i.e. scrambled eggs, grilled cheese)    Baseline not currently performing    Time 4    Period Weeks    Status Partially Met   Supervision, cueing needed for safety              OT Long Term Goals - 02/02/21 1127       OT LONG TERM GOAL #1   Title Pt will be independent with any updated HEPs    Baseline not issued at eval    Time 8    Period Weeks    Status Deferred      OT LONG TERM GOAL #2   Title Pt will perform 9 hole peg test in 48 seconds or less with BUE for increasing fine motor coordination    Baseline L 51.47s, R 58.56s    Time 8    Period Weeks    Status Achieved   RT = 31 sec, Lt = 29.68 sec     OT LONG TERM GOAL #3   Title Pt will improve grip strength in LUE to 66 lbs or greater.    Baseline L 61.9, R 73.8    Time 8    Period Weeks    Status Achieved   Lt = 67.6 lbs (Rt = 78.2 lbs)     OT LONG TERM GOAL #4   Title Pt will perform simple warm meal prep and/or light home management task with mod I and good safety awareness    Baseline not completing    Time 8    Period Weeks    Status New      OT LONG TERM GOAL #5   Title Pt will perform environmental scanning with 90% accuracy    Baseline did not assess at eval, left inattention    Time 8    Period Weeks    Status On-going                   Plan - 02/09/21 1216     Clinical Impression  Statement Pt progressing towards goals. Pt remains limited due to aphasia and cognitive deficits. Pt with improvements in visual/perceptual tasks    OT Occupational Profile and History Problem Focused Assessment - Including  review of records relating to presenting problem    Occupational performance deficits (Please refer to evaluation for details): ADL's;IADL's;Work;Leisure    Body Structure / Function / Physical Skills FMC;ADL;Decreased knowledge of use of DME;Strength;GMC;Dexterity;UE functional use;IADL;Vision;Mobility    Cognitive Skills Understand;Attention    Rehab Potential Good    Clinical Decision Making Limited treatment options, no task modification necessary    Comorbidities Affecting Occupational Performance: None    Modification or Assistance to Complete Evaluation  No modification of tasks or assist necessary to complete eval    OT Frequency 1x / week    OT Duration 8 weeks   6 visits over 8 weeks   OT Treatment/Interventions Self-care/ADL training;DME and/or AE instruction;Therapeutic activities;Therapeutic exercise;Cognitive remediation/compensation;Patient/family education;Visual/perceptual remediation/compensation;Passive range of motion;Neuromuscular education;Energy conservation    Plan check remaining goals and d/c next visit due to MCD limitations and to allow more visits for speech therapy services    Consulted and Agree with Plan of Care Family member/caregiver;Patient    Family Member Consulted Barnett Applebaum, spouse             Patient will benefit from skilled therapeutic intervention in order to improve the following deficits and impairments:   Body Structure / Function / Physical Skills: [facility], ADL, Decreased knowledge of use of DME, Strength, GMC, Dexterity, UE functional use, IADL, Vision, Mobility Cognitive Skills: Understand, Attention     Visit Diagnosis: Hemiplegia and hemiparesis following cerebral infarction affecting left non-dominant side (HCC)  Attention and concentration deficit  Visuospatial deficit  Other lack of coordination  Unsteadiness on feet    Problem List Patient Active Problem List   Diagnosis Date Noted   AKI (acute kidney injury) ([place])    Benign  essential HTN    Hyponatremia    Acute blood loss anemia    Urinary retention    Pressure injury of skin 11/17/2020   Respiratory arrest ([place]) 11/14/2020   Acute respiratory failure with hypoxia ([place]) 11/14/2020   Adverse effects of medication 11/14/2020   Hypotension 11/14/2020   Encounter for central line placement    Aortic dissection ([place]) 11/13/2020   Acute ischemic right MCA stroke ([place]) 11/13/2020   Agitation 11/13/2020   Atelectasis    Phlebitis    Polysubstance abuse ([place])    Elevated troponin 11/07/2020   New onset left bundle branch block (LBBB) 11/07/2020   Protein-calorie malnutrition, severe 11/07/2020   Encephalopathy acute    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 [place], [place], [address] Phone: [phone]   Fax:  [phone]  Name: Jesse [name] MRN: [id number] Date of Birth: Sep 07, 1975

## 2021-02-09 NOTE — Therapy (Signed)
[facility] [address] [place], [place], [address] Phone: [phone]   Fax:  [phone]  Speech Language Pathology Treatment  Patient Details  Name: Jesse [name] MRN: [id number] Date of Birth: 11-11-1975 Referring Provider (SLP): Mariam Dollar, PA-C   Encounter Date: 02/09/2021   End of Session - 02/09/21 1252     Visit Number 9    Number of Visits 17    Date for SLP Re-Evaluation 04/05/21    Authorization Type Medicaid    Authorization - Visit Number 8    Authorization - Number of Visits 9    SLP Start Time 1105    SLP Stop Time  1145    SLP Time Calculation (min) 40 min    Activity Tolerance Patient tolerated treatment well             Past Medical History:  Diagnosis Date   Anxiety    Depression    Essential hypertension    Myocardial infarction (HCC)    S/P aortic dissection repair 11/05/2020   Straight graft replacement of ascending aorta and proximal transverse aortic arch with re-suspension of native aortic valve and open hemi arch distal anastomosis with aorta to right common carotid bypass and aorta to right subclavian bypass   Stroke [facility])     Past Surgical History:  Procedure Laterality Date   ASCENDING AORTIC ROOT REPLACEMENT N/A 11/04/2020   Procedure: 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 WITH REPLACEMENT OF ASCENDING AORTA AND HEMIARCH USING HEMASHIELD PLATINUM GRAFT AND HEMASHIELD GOLD 14 X GRAFT, RESUSPENSION OF NATIVE VALVE, AORTA TO RIGHT CAROTID BYPASS, AORTA TO RIGHT SUBCLAVIAN BYPASS;  Surgeon: Purcell Nails, MD;  Location: [facility] OR;  Service: Open Heart Surgery;  Laterality: N/A;   TEE WITHOUT CARDIOVERSION N/A 11/04/2020   Procedure: TRANSESOPHAGEAL ECHOCARDIOGRAM (TEE);  Surgeon: Purcell Nails, MD;  Location: [facility] OR;  Service: Open Heart Surgery;  Laterality: N/A;    There were no vitals filed for this visit.   Subjective Assessment - 02/09/21 1107      Subjective "we had a surprise visit"    Patient is accompained by: Family member   wife   Currently in Pain? No/denies                   ADULT SLP TREATMENT - 02/09/21 1108       General Information   Behavior/Cognition Alert;Cooperative;Pleasant mood;Requires cueing      Treatment Provided   Treatment provided Cognitive-Linquistic      Cognitive-Linquistic Treatment   Treatment focused on Aphasia;Apraxia    Skilled Treatment SLP targeted functional reading, in which pt able to read 3/11 words of 1-2 syllables  independently. Pt able to read other words x3 with initial phonetic cue. Improved accuracy noted for secondary repetition. SLP targeted comprehension, in which pt identified word given verbal prompt with 100% accuracy. SLP targeted generation of functional words to target at home, in which pt able to verbalize x3 words given sentence starter. Again, accuracy and ability to self-correct improved for secondary repetition of words.      Assessment / Recommendations / Plan   Plan Continue with current plan of care      Progression Toward Goals   Progression toward goals Progressing toward goals              SLP Education - 02/09/21 1251     Education Details HEP, functional high frequency words, use cards/gestures as needed  Person(s) Educated Patient;Spouse    Methods Explanation;Demonstration;Verbal cues;Handout    Comprehension Verbalized understanding;Returned demonstration;Need further instruction;Verbal cues required              SLP Short Term Goals - 02/09/21 1116       SLP SHORT TERM GOAL #1   Title pt will functionally name 7 members of his family/close friends correctly with modified independence in 3 session    Baseline 1; 01-19-21, 01-23-21, 01-26-21    Status Achieved    Target Date 02/13/21      SLP SHORT TERM GOAL #2   Title pt will answer high interest mod complex yes/no questions about familiar topics with 80% success in 3  sessions    Baseline simple and min-mod complex questions 75%    Time 6    Period Weeks    Status On-going    Target Date 02/13/21      SLP SHORT TERM GOAL #3   Title pt will demo understanding of simple requests in a timely manner in 3 sessions    Baseline extra time consistently necessary; 01-19-21, 01-30-21, 02-09-21    Time --    Period --    Status Achieved    Target Date 02/13/21      SLP SHORT TERM GOAL #4   Title pt would demonstrate functional understanding in role-play situation/s for 80% success in 3 sessions    Baseline SLP needed to repeat and slow rate of speech to give pt success    Time 6    Period Weeks    Status On-going    Target Date 02/13/21      SLP SHORT TERM GOAL #5   Title pt will demo functional articulation (no neologism) in 3 simple conversational turns in 2 sessions    Baseline marked neologism in conversation    Time 6    Period Weeks    Status On-going    Target Date 02/13/21              SLP Long Term Goals - 02/09/21 1140       SLP LONG TERM GOAL #1   Title pt will demo understanding of verbal errors by restart or revision 80% of appropriate times, with modified independence, in 3 session    Time 12    Period Weeks    Status On-going    Target Date 04/05/21      SLP LONG TERM GOAL #2   Title pt will functionally communicate in 5 minutes simple conversation using compensations in 2 sessions    Baseline did not do so    Time 12    Period Weeks    Status On-going    Target Date 04/05/21      SLP LONG TERM GOAL #3   Title wife will demo understanding how best to cue pt for auditory comprehension in 3 sessions    Baseline not demonstrated    Time 12    Period Weeks    Status On-going    Target Date 04/05/21      SLP LONG TERM GOAL #4   Title wife will demo understanding how best to cue pt for verbal expression in 3 sessions    Baseline not demonstrated    Time 12    Period Weeks    Status On-going    Target Date 04/05/21               Plan - 02/09/21 1139     Clinical Impression  Statement [name] Nida Boatman") presents today with severe aphasia (likely Wernicke's type) and apraxia. Pt is exhibiting some improvements for spontaneous speech, word level reading comprehension, and basic writing. SLP targeted reading and auditory comprehension this session, which was more consistent for simple language. Some independent accurate verbalizations noted for single words for reading and speaking this session. Pt will benefit from skilled ST targeting improved receptive and expressive language.    Speech Therapy Frequency 2x / week    Duration 12 weeks   or until Medicaid visits are exhausted   Treatment/Interventions Compensatory techniques;Functional tasks;Multimodal communcation approach;Cueing hierarchy;SLP instruction and feedback;Language facilitation;Cognitive reorganization;Compensatory strategies;Internal/external aids;Patient/family education    Potential to Achieve Goals Fair    Potential Considerations Severity of impairments;Financial resources    SLP Home Exercise Plan provided    Consulted and Agree with Plan of Care Patient;Family member/caregiver    Family Member Consulted wife             Patient will benefit from skilled therapeutic intervention in order to improve the following deficits and impairments:   Aphasia  Verbal apraxia    Problem List Patient Active Problem List   Diagnosis Date Noted   AKI (acute kidney injury) (HCC)    Benign essential HTN    Hyponatremia    Acute blood loss anemia    Urinary retention    Pressure injury of skin 11/17/2020   Respiratory arrest (HCC) 11/14/2020   Acute respiratory failure with hypoxia (HCC) 11/14/2020   Adverse effects of medication 11/14/2020   Hypotension 11/14/2020   Encounter for central line placement    Aortic dissection (HCC) 11/13/2020   Acute ischemic right MCA stroke (HCC) 11/13/2020   Agitation 11/13/2020   Atelectasis     Phlebitis    Polysubstance abuse (HCC)    Elevated troponin 11/07/2020   New onset left bundle branch block (LBBB) 11/07/2020   Protein-calorie malnutrition, severe 11/07/2020   Encephalopathy acute    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 [place], [place], [address] Phone: [phone]   Fax:  [phone]   Name: Zair Borawski MRN: [id number] Date of Birth: 21-Aug-1975

## 2021-02-09 NOTE — Therapy (Signed)
[facility] [address] [place], [place], [address] Phone: [phone]   Fax:  [phone]  Physical Therapy Treatment  Patient Details  Name: Jesse [name] MRN: [id number] Date of Birth: 10/04/1975 Referring Provider (PT): [place] (follow up with Frann Rider, NP)   Encounter Date: 02/09/2021   PT End of Session - 02/09/21 0944     Visit Number 6    Number of Visits 7    Date for PT Re-Evaluation 02/20/21    Authorization Type Medicaid [employer]-auth requested    PT Start Time 0930    PT Stop Time 5638    PT Time Calculation (min) 45 min    Equipment Utilized During Treatment Gait belt    Activity Tolerance Patient tolerated treatment well    Behavior During Therapy [facility] for tasks assessed/performed             Past Medical History:  Diagnosis Date   Anxiety    Depression    Essential hypertension    Myocardial infarction ([place])    S/P aortic dissection repair 11/05/2020   Straight graft replacement of ascending aorta and proximal transverse aortic arch with re-suspension of native aortic valve and open hemi arch distal anastomosis with aorta to right common carotid bypass and aorta to right subclavian bypass   Stroke [facility])     Past Surgical History:  Procedure Laterality Date   ASCENDING AORTIC ROOT REPLACEMENT N/A 11/04/2020   Procedure: 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 WITH REPLACEMENT OF ASCENDING AORTA AND HEMIARCH USING HEMASHIELD PLATINUM 26MM GRAFT AND HEMASHIELD GOLD 14 X 8MM GRAFT, RESUSPENSION OF NATIVE VALVE, AORTA TO RIGHT CAROTID BYPASS, AORTA TO RIGHT SUBCLAVIAN BYPASS;  Surgeon: Rexene Alberts, MD;  Location: [place];  Service: Open Heart Surgery;  Laterality: N/A;   TEE WITHOUT CARDIOVERSION N/A 11/04/2020   Procedure: TRANSESOPHAGEAL ECHOCARDIOGRAM (TEE);  Surgeon: Rexene Alberts, MD;  Location: [place];  Service: Open Heart Surgery;  Laterality: N/A;    There were no  vitals filed for this visit.   Subjective Assessment - 02/09/21 0945     Subjective wife reports BP was 112/104 this morning. No stumbles or falls.    Patient is accompained by: Family member    Pertinent History Wife Gina    Patient Stated Goals Pt's goals for therapy are to "get back to me".  He wants to go fishing, long distances.    Currently in Pain? No/denies                   BP at start of the session 106/79, HR 102 bpm Gait training: 1 x 460' with fast walking, swinging with arms Gait training: 1 x 230' each with horizontal head turns and vertical head turns Gait training: 1 x 115' with walking bwd Gait training: 8x 20' with eyes closed, cues to compensate with R UE BP at end of the session 117/74, HR 83bpm                        PT Short Term Goals - 01/26/21 1019       PT SHORT TERM GOAL #1   Title Pt will perform HEP with family supervision for improved strength, balance, transfers, and gait.  TARGET  01/30/2021    Baseline Met, 01/26/21    Time 3    Period Weeks    Status Achieved      PT SHORT TERM GOAL #2  Title Pt will perform sit<>stand transfers, at least 5 reps minimal to no UE support and independently for improved functional strength.    Baseline Met 14.81 sec, without UE support. 01/26/21    Time 3    Period Weeks    Status Achieved      PT SHORT TERM GOAL #3   Title Pt will improve TUG score to less than or equal to 15 sec for decreased fall risk.    Baseline Met, 12.18 sec (>13.5 sec indicates increased fall risk) 01/26/21    Time 3    Period Weeks    Status Achieved      PT SHORT TERM GOAL #4   Title Pt will improve DGI score to at least 14/24 to decrease fall risk.    Baseline Met, 22/24 (scores <19/24 indicate increased fall risk) 01/26/21.    Time 3    Period Weeks    Status Achieved      PT SHORT TERM GOAL #5   Title Pt/wife will verbalize understanding of fall prevention in the home environment.    Baseline  Met, 01/26/21    Time 3    Period Weeks    Status Achieved               PT Long Term Goals - 01/05/21 2139       PT LONG TERM GOAL #1   Title Pt will perform progression of HEP with family supervision for improved strength, balance, transfers, and gait.    Baseline no current HEP    Time 6    Period Weeks    Status New      PT LONG TERM GOAL #2   Title Pt will improve gait velocity to at least 2.62 ft/sec for improved gait efficiency and safety.    Baseline 2.23 ft/sec    Time 6    Period Weeks    Status New      PT LONG TERM GOAL #3   Title Pt will improve TUG score to less than or equal to 13.5 sec for decreased fall risk.    Baseline 22.84 sec    Time 6    Period Weeks    Status New      PT LONG TERM GOAL #4   Title Pt will improve DGI score to at least 19/24 to decrease fall risk.    Baseline 11/24    Time 6    Period Weeks    Status New      PT LONG TERM GOAL #5   Title Pt will ambulate at least 1000 ft, indoors and outdoors, no device, independently for improved household and community gait for activities with children and family.    Baseline Currently min guard for 150 ft/indoor distances    Time 6    Period Weeks    Status New                   Plan - 02/09/21 0957     Clinical Impression Statement Pt demo signficant change in his gait speed and balance with incorporating arm swings.    Personal Factors and Comorbidities Comorbidity 3+    Comorbidities PMH: Anxiety, Depression, Essential hypertension    Examination-Activity Limitations Locomotion Level;Transfers;Sit;Stairs;Stand;Dressing;Toileting;Caring for Others    Examination-Participation Restrictions Occupation;Community Activity;Driving;Interpersonal Relationship    Stability/Clinical Decision Making Unstable/Unpredictable   postural hypotension, low blood pressure   Rehab Potential Good    PT Frequency 1x / week    PT Duration  6 weeks   plus eval   PT Treatment/Interventions  ADLs/Self Care Home Management;Electrical Stimulation;DME Instruction;Gait training;Stair training;Functional mobility training;Therapeutic activities;Therapeutic exercise;Balance training;Neuromuscular re-education;Passive range of motion;Patient/family education;Orthotic Fit/Training;Vestibular    PT Next Visit Plan progress HEP to standing balance on compliant surface.  gait with head turns/counter balance exercises.  Need to monitor vitals, as pt has been experiencing variable BP    PT Home Exercise Plan [email or web address]    Consulted and Agree with Plan of Care Patient;Family member/caregiver    Family Member Consulted wife             Patient will benefit from skilled therapeutic intervention in order to improve the following deficits and impairments:  Abnormal gait, Difficulty walking, Decreased balance, Decreased mobility, Decreased strength, Postural dysfunction  Visit Diagnosis: Other abnormalities of gait and mobility  Hemiplegia and hemiparesis following cerebral infarction affecting left non-dominant side (HCC)  Muscle weakness (generalized)  Unsteadiness on feet     Problem List Patient Active Problem List   Diagnosis Date Noted   AKI (acute kidney injury) ([place])    Benign essential HTN    Hyponatremia    Acute blood loss anemia    Urinary retention    Pressure injury of skin 11/17/2020   Respiratory arrest ([place]) 11/14/2020   Acute respiratory failure with hypoxia ([place]) 11/14/2020   Adverse effects of medication 11/14/2020   Hypotension 11/14/2020   Encounter for central line placement    Aortic dissection ([place]) 11/13/2020   Acute ischemic right MCA stroke ([place]) 11/13/2020   Agitation 11/13/2020   Atelectasis    Phlebitis    Polysubstance abuse ([place])    Elevated troponin 11/07/2020   New onset left bundle branch block (LBBB) 11/07/2020   Protein-calorie malnutrition, severe 11/07/2020   Encephalopathy acute    Cerebral infarction due to thrombosis of cerebral  artery (HCC)    Acute hypoxemic respiratory failure ([place])    Acute thoracic aortic dissection 11/05/2020   S/P aortic dissection repair 11/05/2020   Cerebral embolism with cerebral infarction 11/05/2020   Essential hypertension     Kerrie Pleasure, PT 02/09/2021, 10:19 AM  [facility] [address] [place], [place], [address] Phone: [phone]   Fax:  [phone]  Name: Jesse [name] MRN: [id number] Date of Birth: April 28, 1975

## 2021-02-10 ENCOUNTER — Ambulatory Visit: Payer: Self-pay | Admitting: Thoracic Surgery (Cardiothoracic Vascular Surgery)

## 2021-02-10 ENCOUNTER — Ambulatory Visit: Payer: Self-pay | Admitting: Family

## 2021-02-12 NOTE — Progress Notes (Signed)
I agree with the above plan 

## 2021-02-13 ENCOUNTER — Ambulatory Visit: Payer: Medicaid Other

## 2021-02-13 NOTE — Progress Notes (Signed)
Referral Reason Jesse [name] was referred for genetic consult and testing of aortopathies after having an ascending aortic root replacement in July, 2022.  Personal Medical Information Jesse [name] (III.3 on pedigree) is a 45 year old pleasant Caucasian man who is here today with his wife, Jesse [name]. She is the primary provider of his medical information as he still struggles to speak clearly after suffering an acute stroke in July, 2022.   She tells me that he was a former tow truck driver and was under a lot of stress due to reduced work hours and having to move out of his mother's home after her death. On Nov 23, 2020 he complained of chest pains while mowing the yard. 30 minutes later he had blurred vision in his right eye. He felt very tired and lay down to sleep for an hour. Jesse [name] says that he woke up speaking gibberish. 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 He underwent emergency surgery to replace the ascending aortic root.  Jesse [name] informs me that she was told at the hospital that he may have Marfan's syndrome (MFS) as he has tall gestalt and pectus excavatum. She states that he has notbeen formally diagnosed with MFS.   Traditional Risk Factors Jesse [name] states that Jesse [name] has never been to a doctor. She suspects that he has HTN that was untreated based on his frequent headaches, stress, quick temper and family history of HTN in both parents. He has been a smoker for the last 22 years.    Family history Jesse [name] (III.3) has three healthy boys, ages 15, 41 and 43 (IV.6-IV.8). Jesse [name] tells me that she is concerned about her 60 y.o. son who currently works at [employer] who was recently found to have HTN when the EMS was summoned at work for heart palpitations. He also has headaches and is very anxious person. She states that he has skeletal abnormalities including scoliosis, pectus excavatum and pes planus.  The middle son suffers from frequent headaches but is otherwise fine.   Jesse [name] has older identical twin brothers, ages 37 (III.1, III.2), both with hyperlipidemia and early-onset diabetes. Otherwise, his brothers and their children (IV.1-IV.5) are in good health.   Jesse [name]'s father (II.8) has ASCVD and has had stents placed several times. While he was being prepped for a quintuple bypass in 2010 he was found to have an abdominal aortic aneurysm. This was monitored yearly and he underwent repair for the AAA in 2013, at age 89. He also suffered from hypertension and hyperlipidemia.  There are no reports of aneurysm or dissections in his paternal relatives. Does note CHF in a paternal aunt (II.5) who died at 67, and grandfather (I.1) who died of a heart attack in his 59s.  Jesse [name]'s mother (II.9) died recently at age 11 from emphysema. She had COPD, osteoporosis and HTN. She had 2 brothers- both died on kidney disease at 12 and 59 (II.10, II.11). Maternal grandfather (I.3) died in his 12s or 45s in a rabbit hunting accident and grandmother died of a stoke at 77 (I.4).  Pre-Test Genetic Consultation Notes  Jesse [name] and Jesse [name] was counseled on the genetics of syndromes that present with aortic aneurysms and dissections, specifically Marfan syndrome (MFS), Loeys-Dietz syndrome (LDS), Vascular Ehlers-Danlos Syndrome (vEDS) and other non-syndromic familial forms of thoracic aortic aneurysms and dissection (FTAAD). I explained to them that aortopathies are an autosomal dominant condition. In light of his father having  abdominal aortic aneurysm and repair it is likely that he has inherited this condition from his father. Thus, his twin brothers and sons have a 50% chance of inheriting this condition. I explained to them that his first brothers should get screened for thoracic aortic aneurysms.   I discussed incomplete penetrance associated with this condition i.e. not all individuals harboring a mutation will  present clinically, and age-related penetrance where clinical presentation increases with advanced age. I also reviewed variable expression and emphasized that this condition can express at any age at any level of severity in the family. Hence, it is important for his first-degree relatives to undergo CTA screening for aortic aneurysms.   We walked through the process of genetic testing.   I explained to him that genetic testing is a probabilistic test dependent upon his age and severity of presentation, presence of risk factors and importantly family history of aortic aneurysms or sudden death in first-degree relatives.  The potential outcomes of genetic testing and subsequent management of at-risk family members were discussed so as to manage expectations-  I explained to him that if a mutation is not identified, then all first-degree relatives should undergo cardiac screening for aortic aneurysms. While nearly 11% of AoD cases are idiopathic, the presence of a significant family history of disease or sudden death makes it likely that he has a genetic condition.  A negative test result can be due to limitations of the genetic test.   There is also the likelihood of identifying a "Variant of unknown significance". This result means that the variant has not been detected in a statistically significant number of patients and/or functional studies have not been performed to verify its pathogenicity. This VUS can be tested in the family to see if it segregates with disease. If a VUS is found, first-degree relatives should get screened.  If a pathogenic variant is reported, then his first-degree family members can get tested for this variant. If they test positive, it is likely they will develop an aortic aneurysm. In light of variable expression and incomplete penetrance associated with this condition, it is not possible to predict when they will manifest clinically with an aneurysm. It is recommended that  family members that test positive for the familial pathogenic variant pursue clinical screening.  Impression  In summary, Jesse [name] presents with an aortic dissection at age 46. Additionally, his father was found to have an AAA at age 27 and underwent a repair three years later. Thus, it is highly likely that he has a genetic condition.   Genetic testing for the genes implicated in connective tissue disorders is recommended. This test should include the major genes that contribute to MFS, LDS, vEDS and FTAAD. The genetic test will help confirm his diagnosis and identify the genetic basis of his disease. Since this is an autosomal dominant condition, a positive test result will help identify first-degree family members that may harbor the mutation and are at risk of developing this condition. Appropriate cardiology follow-up and lifestyle management can then be directed to those genotype-positive family members.   In addition, we discussed the protections afforded by the Genetic Information Non-Discrimination Act (GINA). I explained to him that GINA protects him from losing employment or health insurance based on his genotype. However, these protections do not cover life insurance and disability. She verbalized understanding of this and states that their kids do not have life insurance.  Please note that the patient has not been counseled in this visit on personal,  cultural or ethical issues that he may face due to his heart condition.   Plan After a thorough discussion of the risk and benefits of genetic testing for HCM, Meliton states his intent to pursue genetic testing for aortopathies and signed the informed consent form. Blood was drawn today for genetic testing.   Jesse [name], Ph.D, [facility] Clinical Molecular Geneticist

## 2021-02-16 ENCOUNTER — Ambulatory Visit: Payer: Medicaid Other | Admitting: Occupational Therapy

## 2021-02-16 ENCOUNTER — Ambulatory Visit: Payer: Medicaid Other | Admitting: Physical Therapy

## 2021-02-16 ENCOUNTER — Other Ambulatory Visit: Payer: Self-pay

## 2021-02-16 ENCOUNTER — Encounter: Payer: Self-pay | Admitting: Speech Pathology

## 2021-02-16 ENCOUNTER — Ambulatory Visit: Payer: Medicaid Other | Admitting: Speech Pathology

## 2021-02-16 DIAGNOSIS — R278 Other lack of coordination: Secondary | ICD-10-CM | POA: Diagnosis not present

## 2021-02-16 DIAGNOSIS — R41842 Visuospatial deficit: Secondary | ICD-10-CM | POA: Diagnosis not present

## 2021-02-16 DIAGNOSIS — R2689 Other abnormalities of gait and mobility: Secondary | ICD-10-CM | POA: Diagnosis not present

## 2021-02-16 DIAGNOSIS — R482 Apraxia: Secondary | ICD-10-CM | POA: Diagnosis not present

## 2021-02-16 DIAGNOSIS — M6281 Muscle weakness (generalized): Secondary | ICD-10-CM | POA: Diagnosis not present

## 2021-02-16 DIAGNOSIS — I69354 Hemiplegia and hemiparesis following cerebral infarction affecting left non-dominant side: Secondary | ICD-10-CM | POA: Diagnosis not present

## 2021-02-16 DIAGNOSIS — R41840 Attention and concentration deficit: Secondary | ICD-10-CM | POA: Diagnosis not present

## 2021-02-16 DIAGNOSIS — R4701 Aphasia: Secondary | ICD-10-CM | POA: Diagnosis not present

## 2021-02-16 DIAGNOSIS — R2681 Unsteadiness on feet: Secondary | ICD-10-CM | POA: Diagnosis not present

## 2021-02-16 NOTE — Therapy (Signed)
[facility] [address] [place], [place], [address] Phone: [phone]   Fax:  [phone]  Speech Language Pathology Treatment   Patient Details  Name: Jesse [name] MRN: [id number] Date of Birth: [birth year] Referring Provider (SLP): Mariam Dollar, PA-C   Encounter Date: 02/16/2021   End of Session - 02/16/21 1124     Visit Number 10    Number of Visits 17    Date for SLP Re-Evaluation 04/05/21    Authorization Type Medicaid    Authorization - Visit Number 9    Authorization - Number of Visits 15   Checked w/ front desk Jonny Ruiz) - patient approved for 15 visits. Adjusted to reflect current visit auth (Anaissa Macfadden, SLP)   SLP Start Time 1100    SLP Stop Time  1140    SLP Time Calculation (min) 40 min    Activity Tolerance Patient tolerated treatment well             Past Medical History:  Diagnosis Date   Anxiety    Depression    Essential hypertension    Myocardial infarction [facility])    S/P aortic dissection repair 11/05/2020   Straight graft replacement of ascending aorta and proximal transverse aortic arch with re-suspension of native aortic valve and open hemi arch distal anastomosis with aorta to right common carotid bypass and aorta to right subclavian bypass   Stroke [facility])     Past Surgical History:  Procedure Laterality Date   ASCENDING AORTIC ROOT REPLACEMENT N/A 11/04/2020   Procedure: 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 WITH REPLACEMENT OF ASCENDING AORTA AND HEMIARCH USING HEMASHIELD PLATINUM GRAFT AND HEMASHIELD GOLD 14 X GRAFT, RESUSPENSION OF NATIVE VALVE, AORTA TO RIGHT CAROTID BYPASS, AORTA TO RIGHT SUBCLAVIAN BYPASS;  Surgeon: Purcell Nails, MD;  Location: [facility] OR;  Service: Open Heart Surgery;  Laterality: N/A;   TEE WITHOUT CARDIOVERSION N/A 11/04/2020   Procedure: TRANSESOPHAGEAL ECHOCARDIOGRAM (TEE);  Surgeon: Purcell Nails, MD;  Location[facility] OR;  Service: Open Heart  Surgery;  Laterality: N/A;    There were no vitals filed for this visit.   Subjective Assessment - 02/16/21 1101     Subjective Pt reported he had pizza for his birthday.    Currently in Pain? Yes    Pain Score 2     Pain Location Foot    Pain Orientation Right;Left    Pain Descriptors / Indicators Pins and needles    Pain Onset More than a month ago    Pain Frequency Constant    Pain Relieving Factors nothing                   ADULT SLP TREATMENT - 02/16/21 1138       General Information   Behavior/Cognition Alert;Cooperative;Pleasant mood;Requires cueing      Treatment Provided   Treatment provided Cognitive-Linquistic      Cognitive-Linquistic Treatment   Treatment focused on Aphasia;Apraxia    Skilled Treatment SLP facilitated increased verbal expression and short phrase comprehension during sentence completion task (ie. up and ____). Pt was able to read sentence completion phrase and select the correct choice given a F03. Pt required max visual and verbal cueing, repetition, and phonemic cue to verbalize sentence completion phrase. Pt was able to read and choose the correct word to finish the sentence with 100% acc independently. SLP began providing edu on communication strategies to assist (providing extra time, wife asking if it is "ok" prior to  attempting to fill in pt word/thought). *Will leave "communication strategies for BI" on desk to talk with pt and wife about to decrease communicative breakdowns at home.      Assessment / Recommendations / Plan   Plan Continue with current plan of care      Progression Toward Goals   Progression toward goals Progressing toward goals                SLP Short Term Goals - 02/16/21 1136       SLP SHORT TERM GOAL #1   Title pt will functionally name 7 members of his family/close friends correctly with modified independence in 3 session    Baseline 1; 01-19-21, 01-23-21, 01-26-21    Status Achieved    Target Date  02/13/21      SLP SHORT TERM GOAL #2   Title pt will answer high interest mod complex yes/no questions about familiar topics with 80% success in 3 sessions    Baseline simple and min-mod complex questions 75%    Time 6    Period Weeks    Status On-going    Target Date 02/13/21      SLP SHORT TERM GOAL #3   Title pt will demo understanding of simple requests in a timely manner in 3 sessions    Baseline extra time consistently necessary; 01-19-21, 01-30-21, 02-09-21    Status Achieved    Target Date 02/13/21      SLP SHORT TERM GOAL #4   Title pt would demonstrate functional understanding in role-play situation/s for 80% success in 3 sessions    Baseline SLP needed to repeat and slow rate of speech to give pt success    Time 6    Period Weeks    Status On-going    Target Date 02/13/21      SLP SHORT TERM GOAL #5   Title pt will demo functional articulation (no neologism) in 3 simple conversational turns in 2 sessions    Baseline marked neologism in conversation    Time 6    Period Weeks    Status On-going    Target Date 02/13/21              SLP Long Term Goals - 02/16/21 1136       SLP LONG TERM GOAL #1   Title pt will demo understanding of verbal errors by restart or revision 80% of appropriate times, with modified independence, in 3 session    Time 12    Period Weeks    Status On-going      SLP LONG TERM GOAL #2   Title pt will functionally communicate in 5 minutes simple conversation using compensations in 2 sessions    Baseline did not do so    Time 12    Period Weeks    Status On-going      SLP LONG TERM GOAL #3   Title wife will demo understanding how best to cue pt for auditory comprehension in 3 sessions    Baseline not demonstrated    Time 12    Period Weeks    Status On-going      SLP LONG TERM GOAL #4   Title wife will demo understanding how best to cue pt for verbal expression in 3 sessions    Baseline not demonstrated    Time 12    Period  Weeks    Status On-going  Plan - 02/16/21 1135     Clinical Impression Statement Jesse [name]") presents today with severe aphasia (likely Wernicke's type) and apraxia. Pt is exhibiting some improvements for spontaneous speech, word level reading comprehension, and basic writing. SLP targeted reading and auditory comprehension this session, which was more consistent for simple language. Some independent accurate verbalizations noted for single words for reading and speaking this session. Pt will benefit from skilled ST targeting improved receptive and expressive language.    Speech Therapy Frequency 2x / week    Duration 12 weeks   or until Medicaid visits are exhausted   Treatment/Interventions Compensatory techniques;Functional tasks;Multimodal communcation approach;Cueing hierarchy;SLP instruction and feedback;Language facilitation;Cognitive reorganization;Compensatory strategies;Internal/external aids;Patient/family education    Potential to Achieve Goals Fair    Potential Considerations Severity of impairments;Financial resources    SLP Home Exercise Plan provided    Consulted and Agree with Plan of Care Patient;Family member/caregiver    Family Member Consulted wife             Patient will benefit from skilled therapeutic intervention in order to improve the following deficits and impairments:   Aphasia  Verbal apraxia    Problem List Patient Active Problem List   Diagnosis Date Noted   AKI (acute kidney injury) (HCC)    Benign essential HTN    Hyponatremia    Acute blood loss anemia    Urinary retention    Pressure injury of skin 11/17/2020   Respiratory arrest (HCC) 11/14/2020   Acute respiratory failure with hypoxia (HCC) 11/14/2020   Adverse effects of medication 11/14/2020   Hypotension 11/14/2020   Encounter for central line placement    Aortic dissection (HCC) 11/13/2020   Acute ischemic right MCA stroke (HCC) 11/13/2020   Agitation  11/13/2020   Atelectasis    Phlebitis    Polysubstance abuse (HCC)    Elevated troponin 11/07/2020   New onset left bundle branch block (LBBB) 11/07/2020   Protein-calorie malnutrition, severe 11/07/2020   Encephalopathy acute    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)    Acute hypoxemic respiratory failure (HCC)    Acute thoracic aortic dissection 11/05/2020   S/P aortic dissection repair 11/05/2020   Cerebral embolism with cerebral infarction 11/05/2020   Essential hypertension    Speech Therapy Progress Note  Dates of Reporting Period: 01/05/21 to present  Subjective Statement: "I know ..what I want to say..can't get it out."  Objective: See previous tx notes.  Goal Update: See goals. Progressing towards.  Plan: Cont to monitor reading comprehension and increase expressive communication. Edu on communication partner strategies.   Reason Skilled Services are Required: SLP rec cont skilled ST services to address expressive and receptive language deficits to maximize functional  communication.   Dorena Bodo MS, [place], CBIS  02/16/2021, 11:54 AM  [facility] [address] [place], [place], [address] Phone: [phone]   Fax:  [phone]   Name: Jesse [name] MRN: [id number] Date of Birth: [birth year]

## 2021-02-17 ENCOUNTER — Encounter: Payer: Self-pay | Admitting: Thoracic Surgery (Cardiothoracic Vascular Surgery)

## 2021-02-17 ENCOUNTER — Ambulatory Visit: Payer: Medicaid Other | Admitting: Thoracic Surgery (Cardiothoracic Vascular Surgery)

## 2021-02-17 VITALS — BP 158/101 | HR 79 | Resp 20 | Ht 76.0 in | Wt 140.0 lb

## 2021-02-17 DIAGNOSIS — Z9889 Other specified postprocedural states: Secondary | ICD-10-CM

## 2021-02-17 DIAGNOSIS — I7101 Dissection of ascending aorta: Secondary | ICD-10-CM | POA: Diagnosis not present

## 2021-02-17 NOTE — Progress Notes (Signed)
[address]       [place] [address]             [phone]     HPI: Mr. 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  Delorean Knutzen is a 45 year old man who presented to the emergency department back in July with acute onset of aphasia and visual changes.  He was found to have a type II dissection.  He underwent emergent Hemi arch repair with a debranching Y-graft to the right carotid and subclavian by Dr. Cornelius Moras on 11/04/2020.  His postoperative course was complicated but he ultimately went to rehab.  He has problems with paresthesias in his right hand and his feet.  That is his primary complaint.  Still has some visual impairment.  His aphasia has improved dramatically.  He is being followed by neurology and saw them a couple of weeks ago.  Past Medical History:  Diagnosis Date   Anxiety    Depression    Essential hypertension    Myocardial infarction [facility])    S/P aortic dissection repair 11/05/2020   Straight graft replacement of ascending aorta and proximal transverse aortic arch with re-suspension of native aortic valve and open hemi arch distal anastomosis with aorta to right common carotid bypass and aorta to right subclavian bypass   Stroke [facility])     Current Outpatient Medications  Medication Sig Dispense Refill   acetaminophen (TYLENOL) 325 MG tablet Take 1-2 tablets (325-650 mg total) by mouth every 4 (four) hours as needed for mild pain.     aspirin 81 MG chewable tablet Chew 1 tablet (81 mg total) by mouth daily.     carvedilol (COREG) 3.125 MG tablet TAKE 1 TABLET TWICE DAILY WITH MEALS 60 tablet 3   ferrous sulfate 325 (65 FE) MG tablet Take 1 tablet (325 mg total) by mouth daily. 30 tablet 3   mirtazapine (REMERON) 7.5 MG tablet Take 1 tablet (7.5 mg total) by mouth at bedtime. 30 tablet 0   pantoprazole sodium (PROTONIX) 40 mg/20 mL PACK Dissolve 1 packet in 20 mLs (40 mg total) and take by mouth daily. 30 mL 0    polyethylene glycol (MIRALAX / GLYCOLAX) 17 g packet Take 17 g by mouth daily as needed for moderate constipation. 14 each 0   QUEtiapine (SEROQUEL) 50 MG tablet TAKE 1 TABLET BY MOUTH AT BEDTIME. AND 1/2 TABLET DURING THE DAY AS NEEDED FOR AGITATION 45 tablet 0   rosuvastatin (CRESTOR) 20 MG tablet Take 1 tablet (20 mg total) by mouth daily. 30 tablet 11   tamsulosin (FLOMAX) 0.4 MG CAPS capsule Take 2 capsules (0.8 mg total) by mouth daily after supper. 60 capsule 0   Zinc Sulfate 220 (50 Zn) MG TABS Take 1 tablet (220 mg total) by mouth daily. 90 tablet 0   amLODipine (NORVASC) 5 MG tablet Take 2.5 mg by mouth daily. (Patient not taking: Reported on 02/17/2021)     No current facility-administered medications for this visit.    Physical Exam BP (!) 158/101 (BP Location: Right Arm, Patient Position: Sitting)   Pulse 79   Resp 20   Ht 6\' 4"  (1.93 m)   Wt 140 lb (63.5 kg)   SpO2 98% Comment: rA  BMI 17.30 kg/m  45 year old man in no acute distress Alert and oriented x3, speech clear, good motor strength Cardiac regular rate and rhythm Lungs clear bilaterally Pulses symmetric  Diagnostic Tests: CT ANGIOGRAPHY CHEST,  ABDOMEN AND PELVIS   TECHNIQUE: Multidetector CT imaging through the chest, abdomen and pelvis was performed using the standard protocol during bolus administration of intravenous contrast. Multiplanar reconstructed images and MIPs were obtained and reviewed to evaluate the vascular anatomy.   CONTRAST:  29mL ISOVUE-370 IOPAMIDOL (ISOVUE-370) INJECTION 76%   COMPARISON:  Preoperative CT 11/04/2020   FINDINGS: CTA CHEST FINDINGS   Cardiovascular:   Heart:   No cardiomegaly. No pericardial fluid/thickening. No significant coronary calcifications.   Aorta:   No calcifications of the aortic valve.   Interval surgical repair of type a dissection with native valve resuspension, hemi arch repair of the ascending aorta with unremarkable appearance of the  proximal anastomosis at the sinotubular junction, and unremarkable appearance of the distal anastomosis just proximal to the left common carotid origin.   Bifurcated branch bypass to the right subclavian artery and right common carotid artery unremarkable and remains patent.   Right subclavian artery anastomosis unremarkable, with no pseudoaneurysm or significant stenosis. Right vertebral artery arises just after the anastomosis and remains patent.   Right common carotid artery anastomosis unremarkable, remains patent at the base of the neck.   Left common carotid artery and left subclavian artery patent. The left vertebral artery has a very proximal origin from the left subclavian artery, remains patent.   Minimal atherosclerosis of the thoracic aorta.   No pedunculated plaque, ulcerated plaque, new dissection/intra mural hematoma, aneurysm.   Diameter of the aorta at the aortic hiatus measures 21 mm.   Pulmonary arteries:   Timing of the contrast bolus is not optimized for evaluation of pulmonary artery filling defects.   Mediastinum/Nodes: Surgical changes of prior median sternotomy. No adenopathy.   Unremarkable course of the thoracic esophagus.   Lungs/Pleura: Central airways are clear. No pleural effusion. No confluent airspace disease.   No pneumothorax.   CTA ABDOMEN AND PELVIS FINDINGS   VASCULAR   Aorta: Mild atherosclerotic changes of the infrarenal abdominal aorta. No pedunculated plaque, ulcerated plaque, aneurysm.   Celiac: Patent, with no significant atherosclerotic changes.   SMA: Patent, with no significant atherosclerotic changes.   Renals:   - Right: Right renal artery patent.   - Left: Main left renal artery patent without significant atherosclerotic changes. Accessory left renal artery of small caliber to the lower pole segment.   IMA: Inferior mesenteric artery is patent.   Right lower extremity:   Unremarkable course, caliber, and  contour of the right iliac system. No aneurysm, dissection, or occlusion. Mild atherosclerotic changes. Hypogastric artery is patent. Common femoral artery patent. Proximal SFA and profunda femoris patent.   Left lower extremity:   Unremarkable course, caliber, and contour of the left iliac system. Mild atherosclerotic changes. No aneurysm, dissection, or occlusion. Hypogastric artery is patent. Common femoral artery patent. Proximal SFA and profunda femoris patent.   Veins: Unremarkable appearance of the venous system.   Review of the MIP images confirms the above findings.   NON-VASCULAR   Lower chest: No acute.   Hepatobiliary: Unremarkable appearance of the liver. Unremarkable gall bladder.   Pancreas: Unremarkable.   Spleen: Unremarkable.   Adrenals/Urinary Tract:   - Right adrenal gland: Unremarkable   - Left adrenal gland: Unremarkable.   - Right kidney: No hydronephrosis, nephrolithiasis, inflammation, or ureteral dilation. No focal lesion.   - Left Kidney: No hydronephrosis, nephrolithiasis, inflammation, or ureteral dilation. No focal lesion.   - Urinary Bladder: Unremarkable.   Stomach/Bowel:   - Stomach: Unremarkable.   - Small bowel: Unremarkable   -  Appendix: Normal.   - Colon: Unremarkable.   Lymphatic: No adenopathy.   Mesenteric: No free fluid or air. No mesenteric adenopathy.   Reproductive: Transverse prostate measures 4.4 cm.   Other: No hernia.   Musculoskeletal: No evidence of acute fracture. No bony canal narrowing. No significant degenerative changes of the hips. Pectus excavatum deformity.   IMPRESSION: Interval surgical repair of type A dissection, with hemi arch, native valve resuspension, and de branching of the innominate artery, with bifurcated graft bypass to the right subclavian artery and right common carotid artery. No complicating features.   Aortic Atherosclerosis (ICD10-I70.0).   Signed,   Yvone Neu. Reyne Dumas, RPVI   [facility]   [facility]     Electronically Signed   By: Gilmer Mor D.O.   On: 02/05/2021 10:19 I personally reviewed the CT images and concur with the findings noted above.  Impression: Jesse [name] is a 45 year old man who presented with a stroke secondary to a type II aortic dissection.  He underwent emergent Hemi arch repair with a debranching Y graft to the right carotid and subclavian.  His postoperative course was complicated primarily due to his stroke and rehab from that.  He currently is doing very well.  His speech is clear and he has no receptive aphasia either.  Still has a lot of paresthesias in that remains a problem.  He has had some issues with depression and his mirtazapine dosage was increased at his recent neurology visit.  His blood pressure is elevated at 150/100.  He only took his medications about 20 minutes ago per his wife.  He does have a cuff at home.  I advised him to check his blood pressure on a regular basis at home.  We would like his systolic to be less than 130 but absolutely needs to be less than 140.  Would like to keep his diastolic less than 90.  If his blood pressure is elevated she will contact us and we can adjust his medications.  I conversed happy to increase his Coreg if needed.  Plan: Return in 6 months with CT angio of chest  Loreli Slot, MD [facility] [phone]

## 2021-02-18 ENCOUNTER — Ambulatory Visit: Payer: Medicaid Other | Admitting: Occupational Therapy

## 2021-02-18 ENCOUNTER — Other Ambulatory Visit: Payer: Self-pay

## 2021-02-18 DIAGNOSIS — R2681 Unsteadiness on feet: Secondary | ICD-10-CM

## 2021-02-18 DIAGNOSIS — M6281 Muscle weakness (generalized): Secondary | ICD-10-CM

## 2021-02-18 DIAGNOSIS — R278 Other lack of coordination: Secondary | ICD-10-CM

## 2021-02-18 DIAGNOSIS — R482 Apraxia: Secondary | ICD-10-CM | POA: Diagnosis not present

## 2021-02-18 DIAGNOSIS — I69354 Hemiplegia and hemiparesis following cerebral infarction affecting left non-dominant side: Secondary | ICD-10-CM | POA: Diagnosis not present

## 2021-02-18 DIAGNOSIS — R41840 Attention and concentration deficit: Secondary | ICD-10-CM | POA: Diagnosis not present

## 2021-02-18 DIAGNOSIS — R41842 Visuospatial deficit: Secondary | ICD-10-CM

## 2021-02-18 DIAGNOSIS — R4701 Aphasia: Secondary | ICD-10-CM | POA: Diagnosis not present

## 2021-02-18 DIAGNOSIS — R2689 Other abnormalities of gait and mobility: Secondary | ICD-10-CM | POA: Diagnosis not present

## 2021-02-18 NOTE — Therapy (Signed)
[facility] [address] [place], [place], [address] Phone: [phone]   Fax:  [phone]  Occupational Therapy Treatment  Patient Details  Name: Jesse [name] MRN: [id number] Date of Birth: 09/01/1975 Referring Provider (OT): f/u Frann Rider, NP   Encounter Date: 02/18/2021   OT End of Session - 02/18/21 1435     Visit Number 7    Number of Visits 7    Date for OT Re-Evaluation 03/03/21   6 visits over 8 weeks   Authorization Type [employer]    Authorization Time Period 27 combined, Auth req'd    OT Start Time 1320    OT Stop Time 1415    OT Time Calculation (min) 55 min    Activity Tolerance Patient tolerated treatment well    Behavior During Therapy [facility] for tasks assessed/performed             Past Medical History:  Diagnosis Date   Anxiety    Depression    Essential hypertension    Myocardial infarction ([place])    S/P aortic dissection repair 11/05/2020   Straight graft replacement of ascending aorta and proximal transverse aortic arch with re-suspension of native aortic valve and open hemi arch distal anastomosis with aorta to right common carotid bypass and aorta to right subclavian bypass   Stroke [facility])     Past Surgical History:  Procedure Laterality Date   ASCENDING AORTIC ROOT REPLACEMENT N/A 11/04/2020   Procedure: 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 WITH REPLACEMENT OF ASCENDING AORTA AND HEMIARCH USING HEMASHIELD PLATINUM 26MM GRAFT AND HEMASHIELD GOLD 14 X 8MM GRAFT, RESUSPENSION OF NATIVE VALVE, AORTA TO RIGHT CAROTID BYPASS, AORTA TO RIGHT SUBCLAVIAN BYPASS;  Surgeon: Rexene Alberts, MD;  Location: [place];  Service: Open Heart Surgery;  Laterality: N/A;   TEE WITHOUT CARDIOVERSION N/A 11/04/2020   Procedure: TRANSESOPHAGEAL ECHOCARDIOGRAM (TEE);  Surgeon: Rexene Alberts, MD;  Location: [place];  Service: Open Heart Surgery;  Laterality: N/A;    There were no vitals  filed for this visit.   Subjective Assessment - 02/18/21 1324     Subjective  Both feet feel more numb today and hypersensitive    Patient is accompanied by: Family member   wife   Pertinent History Anxiety, Depression, Essential hypertension.    Limitations Global Aphasia, Fall, *foley catheter*    Patient Stated Goals unable to state d/t aphasia    Currently in Pain? Yes    Pain Score 2     Pain Location --   feet   Pain Orientation Right;Left    Pain Descriptors / Indicators Pins and needles    Pain Type Acute pain    Pain Onset More than a month ago    Pain Frequency Constant    Aggravating Factors  cold, bumping into it    Pain Relieving Factors nothing              Discussed all goals and progress to date. Reviewed progress in coordination, strength, and visual scanning. Reviewed recommendations for supervision with cooking and questioning cues vs. Giving the answers. Reviewed visual scanning strategies and other previous recommendations. Also discussed saving limited visits for speech services as this is their main concern.   Pt copying fig 1 PVC pipe design w/ initial set up and 1 error. Pt copying more complex PVC pipe design (fig 14) w/ initial cueing to distinguish b/t similar sizes and set up of design - pt with 1  self corrected error.   Environmental scanning finding 11/14 items on first pass (73% accuracy) missing 4 w/ 3 of those on busy backgrounds. Pt found remaining items on 2nd pass w/ min cues.                       OT Short Term Goals - 02/18/21 1437       OT SHORT TERM GOAL #1   Title Pt will be independent with HEP targeting coordination and LUE grip    Baseline not issued at eval    Time 4    Period Weeks    Status Achieved    Target Date 02/02/21      OT SHORT TERM GOAL #2   Title Pt and caregiver will verbalize understanding of visual strategies for increasing independence with ADLs and IADLs.    Baseline not reviewed at eval     Time 4    Period Weeks    Status Achieved      OT SHORT TERM GOAL #3   Title Pt will report shaving self with good safety awareness and with distant supervision only.    Baseline spouse currently shaving/cutting hair    Time 4    Period Weeks    Status Achieved   w/ Copy     OT SHORT TERM GOAL #4   Title Pt will perform simple warm meal prep with supervision and good safety awareness (i.e. scrambled eggs, grilled cheese)    Baseline not currently performing    Time 4    Period Weeks    Status Partially Met   Supervision, cueing needed for safety              OT Long Term Goals - 02/18/21 1437       OT LONG TERM GOAL #1   Title Pt will be independent with any updated HEPs    Baseline not issued at eval    Time 8    Period Weeks    Status Deferred      OT LONG TERM GOAL #2   Title Pt will perform 9 hole peg test in 48 seconds or less with BUE for increasing fine motor coordination    Baseline L 51.47s, R 58.56s    Time 8    Period Weeks    Status Achieved   RT = 31 sec, Lt = 29.68 sec     OT LONG TERM GOAL #3   Title Pt will improve grip strength in LUE to 66 lbs or greater.    Baseline L 61.9, R 73.8    Time 8    Period Weeks    Status Achieved   Lt = 67.6 lbs (Rt = 78.2 lbs)     OT LONG TERM GOAL #4   Title Pt will perform simple warm meal prep and/or light home management task with mod I and good safety awareness    Baseline not completing    Time 8    Period Weeks    Status Not Met   requires supervision     OT LONG TERM GOAL #5   Title Pt will perform environmental scanning with 90% accuracy    Baseline did not assess at eval, left inattention    Time 8    Period Weeks    Status Not Met   54-73% accuracy                  Plan - 02/18/21  1439     Clinical Impression Statement Pt has met all STG's and 2/4 LTG's. Pt has improved in all function, coordination, and grip strength. Pt still requires sup for cooking tasks for  safety/cues    OT Occupational Profile and History Problem Focused Assessment - Including review of records relating to presenting problem    Occupational performance deficits (Please refer to evaluation for details): ADL's;IADL's;Work;Leisure    Body Structure / Function / Physical Skills FMC;ADL;Decreased knowledge of use of DME;Strength;GMC;Dexterity;UE functional use;IADL;Vision;Mobility    Cognitive Skills Understand;Attention    Rehab Potential Good    Clinical Decision Making Limited treatment options, no task modification necessary    Comorbidities Affecting Occupational Performance: None    Modification or Assistance to Complete Evaluation  No modification of tasks or assist necessary to complete eval    OT Frequency 1x / week    OT Duration 8 weeks   6 visits over 8 weeks   OT Treatment/Interventions Self-care/ADL training;DME and/or AE instruction;Therapeutic activities;Therapeutic exercise;Cognitive remediation/compensation;Patient/family education;Visual/perceptual remediation/compensation;Passive range of motion;Neuromuscular education;Energy conservation    Plan D/C O.T. to allow more visits for speech therapy    Consulted and Agree with Plan of Care Family member/caregiver;Patient    Family Member Consulted Barnett Applebaum, spouse             Patient will benefit from skilled therapeutic intervention in order to improve the following deficits and impairments:   Body Structure / Function / Physical Skills: [facility], ADL, Decreased knowledge of use of DME, Strength, GMC, Dexterity, UE functional use, IADL, Vision, Mobility Cognitive Skills: Understand, Attention     Visit Diagnosis: Hemiplegia and hemiparesis following cerebral infarction affecting left non-dominant side (HCC)  Visuospatial deficit  Attention and concentration deficit  Other lack of coordination  Unsteadiness on feet  Muscle weakness (generalized)    Problem List Patient Active Problem List   Diagnosis Date  Noted   AKI (acute kidney injury) ([place])    Benign essential HTN    Hyponatremia    Acute blood loss anemia    Urinary retention    Pressure injury of skin 11/17/2020   Respiratory arrest ([place]) 11/14/2020   Acute respiratory failure with hypoxia (HCC) 11/14/2020   Adverse effects of medication 11/14/2020   Hypotension 11/14/2020   Encounter for central line placement    Aortic dissection ([place]) 11/13/2020   Acute ischemic right MCA stroke (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 Patient goals were partially met. Patient is being discharged due to  limited visits.Carey Bullocks, OTR/L 02/18/2021, 2:42 PM  [facility] [address] [place], [place], [address] Phone: [phone]  Fax:  [phone]  Name: Jesse [name] MRN: [id number] Date of Birth: 06-04-1975

## 2021-02-19 ENCOUNTER — Encounter: Payer: Medicaid Other | Attending: Registered Nurse | Admitting: Physical Medicine & Rehabilitation

## 2021-02-19 ENCOUNTER — Other Ambulatory Visit: Payer: Self-pay

## 2021-02-19 ENCOUNTER — Encounter: Payer: Self-pay | Admitting: Physical Medicine & Rehabilitation

## 2021-02-19 VITALS — BP 143/95 | HR 77 | Ht 76.0 in | Wt 142.4 lb

## 2021-02-19 DIAGNOSIS — R4701 Aphasia: Secondary | ICD-10-CM | POA: Diagnosis not present

## 2021-02-19 DIAGNOSIS — R209 Unspecified disturbances of skin sensation: Secondary | ICD-10-CM | POA: Insufficient documentation

## 2021-02-19 DIAGNOSIS — I69398 Other sequelae of cerebral infarction: Secondary | ICD-10-CM | POA: Diagnosis not present

## 2021-02-19 MED ORDER — GABAPENTIN 100 MG PO CAPS: 100.0000 mg | ORAL_CAPSULE | Freq: Two times a day (BID) | ORAL | 2 refills | Status: DC

## 2021-02-19 NOTE — Progress Notes (Signed)
Subjective:    Patient ID: Jesse [name], male    DOB: July 05, 1975, 45 y.o.   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  More recently he has been complaining of hypersensitivity in his feet.  The patient denies any falls or injury.  Has no prior history of surgeries on his feet.  No history of vascular issues in the.  He has recently seen neurology and has followed up with cardiothoracic surgery and is doing well according to their notes. The patient has completed occupational therapy and has several more visits left with speech therapy. Pain Inventory Average Pain 2 Pain Right Now 4 My pain is sharp, burning, dull, stabbing, tingling, and aching  LOCATION OF PAIN  hands fingers toes and feet on stroke side  BOWEL Number of stools per week: 1-2  BLADDER Normal Difficulty starting stream Yes     Mobility walk with assistance how many minutes can you walk? 2 ability to climb steps?  yes do you drive?  no  Function disabled: date disabled 11/03/20 I need assistance with the following:  dressing, meal prep, household duties, and shopping  Neuro/Psych numbness tingling dizziness confusion depression anxiety  Prior Studies Any changes since last visit?  no  Physicians involved in your care Any changes since last visit?  no   History reviewed. No pertinent family history. Social History   Socioeconomic History   Marital status: Married    Spouse name: Not on file   Number of children: Not on file   Years of education: Not on file   Highest education level: 9th grade  Occupational History   Not on file  Tobacco Use   Smoking status: Former    Types: Cigarettes    Quit date: 11/04/2020    Years since quitting: 0.2   Smokeless tobacco: Never  Vaping Use   Vaping Use: Never used  Substance and Sexual  Activity   Alcohol use: Yes    Alcohol/week: 2.0 standard drinks    Types: 2 Cans of beer per week   Drug use: Not Currently   Sexual activity: Yes  Other Topics Concern   Not on file  Social History Narrative   Not on file   Social Determinants of Health   Financial Resource Strain: Not on file  Food Insecurity: Not on file  Transportation Needs: Not on file  Physical Activity: Not on file  Stress: Not on file  Social Connections: Not on file   Past Surgical History:  Procedure Laterality Date   ASCENDING AORTIC ROOT REPLACEMENT N/A 11/04/2020   Procedure: 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 WITH REPLACEMENT OF ASCENDING AORTA AND HEMIARCH USING HEMASHIELD PLATINUM GRAFT AND HEMASHIELD GOLD 14 X GRAFT, RESUSPENSION OF NATIVE VALVE, AORTA TO RIGHT CAROTID BYPASS, AORTA TO RIGHT SUBCLAVIAN BYPASS;  Surgeon: Purcell Nails, MD;  Location: [facility] OR;  Service: Open Heart Surgery;  Laterality: N/A;   TEE WITHOUT CARDIOVERSION N/A 11/04/2020   Procedure: TRANSESOPHAGEAL ECHOCARDIOGRAM (TEE);  Surgeon: Purcell Nails, MD;  Location: [facility] OR;  Service: Open Heart Surgery;  Laterality: N/A;   Past Medical History:  Diagnosis Date   Anxiety    Depression    Essential hypertension    Myocardial infarction (HCC)    S/P aortic dissection repair 11/05/2020   Straight graft replacement of ascending aorta and proximal  transverse aortic arch with re-suspension of native aortic valve and open hemi arch distal anastomosis with aorta to right common carotid bypass and aorta to right subclavian bypass   Stroke (HCC)    BP (!) 143/95   Pulse 77   Ht 6\' 4"  (1.93 m)   Wt 142 lb 6.4 oz (64.6 kg)   SpO2 97%   BMI 17.33 kg/m   Opioid Risk Score:   Fall Risk Score:  `1  Depression screen PHQ 2/9  Depression screen [facility] 2/9 02/19/2021 01/27/2021 12/30/2020  Decreased Interest 1 1 1   Down, Depressed, Hopeless 1 2 2   PHQ - 2 Score 2 3 3   Altered sleeping - 2 2  Tired, decreased  energy - 2 2  Change in appetite - 1 1  Feeling bad or failure about yourself  - 2 2  Trouble concentrating - 2 2  Moving slowly or fidgety/restless - 0 2  Suicidal thoughts - 1 1  PHQ-9 Score - 13 15  Difficult doing work/chores - - Somewhat difficult     Review of Systems  Constitutional:  Positive for appetite change, diaphoresis and unexpected weight change.       Wt loss, poor appetite  HENT: Negative.    Eyes: Negative.   Respiratory: Negative.    Cardiovascular: Negative.   Gastrointestinal: Negative.   Endocrine: Negative.   Genitourinary:  Positive for difficulty urinating.  Musculoskeletal: Negative.   Skin: Negative.   Allergic/Immunologic: Negative.   Neurological:  Positive for dizziness, speech difficulty and numbness.       Tingling  Hematological: Negative.   Psychiatric/Behavioral:  Positive for confusion and dysphoric mood. The patient is nervous/anxious.   All other systems reviewed and are negative.     Objective:   Physical Exam Vitals and nursing note reviewed.  Constitutional:      Appearance: Normal appearance.  HENT:     Head: Normocephalic and atraumatic.  Eyes:     Extraocular Movements: Extraocular movements intact.     Conjunctiva/sclera: Conjunctivae normal.     Pupils: Pupils are equal, round, and reactive to light.  Musculoskeletal:     Comments: There is no evidence of lower extremity swelling No evidence of joint effusion in the foot or ankle area No erythema no pain with range of motion of the foot or ankle area no evidence of instability.  Pedal pulses and posterior tibial pulses normal bilaterally  Skin:    General: Skin is warm and dry.     Capillary Refill: Capillary refill takes less than 2 seconds.  Neurological:     Mental Status: He is alert and oriented to person, place, and time.     Comments: Fluent aphasia unable to repeat or name.  He was able to name 1 out of 3 simple objects.  He has difficulty following commands  and requires gestural cues given for manual muscle testing. Motor strength is 5/5 bilateral deltoid, bicep, tricep, grip, hip flexor, knee extensor ankle dorsiflexor Ambulates with instability Sensation is difficult to assess secondary to aphasia he does note that he has tingling when rubbing or touching the right foot compared to the left foot He states he is able to sense touch in his feet bilateral.  Psychiatric:        Mood and Affect: Mood normal.        Behavior: Behavior normal.          Assessment & Plan:   1.  Bilateral cortical and subcortical infarcts.  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 In addition his right great and left lower extremity sensory deficits which appear to be related to his CVA.  No history of diabetes. Continue outpatient speech therapy Emphasized the importance of practicing his speech as well as home exercise program given to him by OT. Will trial gabapentin 100 mg twice daily for hypersensitivity, sensory deficits related to stroke. Follow-up in 3 months Continue follow-up with primary care, neurology as well as cardiovascular surgery

## 2021-02-19 NOTE — Patient Instructions (Signed)
Please keep up with exercises and talking  Gabapentin is for nerve hypersensitivity in the feet

## 2021-02-20 ENCOUNTER — Ambulatory Visit: Payer: Medicaid Other

## 2021-02-20 DIAGNOSIS — R482 Apraxia: Secondary | ICD-10-CM | POA: Diagnosis not present

## 2021-02-20 DIAGNOSIS — R2681 Unsteadiness on feet: Secondary | ICD-10-CM | POA: Diagnosis not present

## 2021-02-20 DIAGNOSIS — M6281 Muscle weakness (generalized): Secondary | ICD-10-CM | POA: Diagnosis not present

## 2021-02-20 DIAGNOSIS — I69354 Hemiplegia and hemiparesis following cerebral infarction affecting left non-dominant side: Secondary | ICD-10-CM | POA: Diagnosis not present

## 2021-02-20 DIAGNOSIS — R41842 Visuospatial deficit: Secondary | ICD-10-CM | POA: Diagnosis not present

## 2021-02-20 DIAGNOSIS — R41840 Attention and concentration deficit: Secondary | ICD-10-CM | POA: Diagnosis not present

## 2021-02-20 DIAGNOSIS — R278 Other lack of coordination: Secondary | ICD-10-CM | POA: Diagnosis not present

## 2021-02-20 DIAGNOSIS — R4701 Aphasia: Secondary | ICD-10-CM

## 2021-02-20 DIAGNOSIS — R2689 Other abnormalities of gait and mobility: Secondary | ICD-10-CM | POA: Diagnosis not present

## 2021-02-20 NOTE — Therapy (Signed)
[facility] [address] [place], [place], [address] Phone: [phone]   Fax:  [phone]  Speech Language Pathology Treatment  Patient Details  Name: Jesse [name] MRN: [id number] Date of Birth: 04/26/1976 Referring Provider (SLP): Lauraine Rinne, PA-C   Encounter Date: 02/20/2021   End of Session - 02/20/21 1021     Visit Number 11    Number of Visits 17    Date for SLP Re-Evaluation 04/05/21    Authorization Type Medicaid    Authorization - Visit Number 10    Authorization - Number of Visits 15    SLP Start Time 0962    SLP Stop Time  1100    SLP Time Calculation (min) 44 min    Activity Tolerance Patient tolerated treatment well             Past Medical History:  Diagnosis Date   Anxiety    Depression    Essential hypertension    Myocardial infarction ([place])    S/P aortic dissection repair 11/05/2020   Straight graft replacement of ascending aorta and proximal transverse aortic arch with re-suspension of native aortic valve and open hemi arch distal anastomosis with aorta to right common carotid bypass and aorta to right subclavian bypass   Stroke [facility])     Past Surgical History:  Procedure Laterality Date   ASCENDING AORTIC ROOT REPLACEMENT N/A 11/04/2020   Procedure: 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 WITH REPLACEMENT OF ASCENDING AORTA AND HEMIARCH USING HEMASHIELD PLATINUM 26MM GRAFT AND HEMASHIELD GOLD 14 X 8MM GRAFT, RESUSPENSION OF NATIVE VALVE, AORTA TO RIGHT CAROTID BYPASS, AORTA TO RIGHT SUBCLAVIAN BYPASS;  Surgeon: Rexene Alberts, MD;  Location: [facility];  Service: Open Heart Surgery;  Laterality: N/A;   TEE WITHOUT CARDIOVERSION N/A 11/04/2020   Procedure: TRANSESOPHAGEAL ECHOCARDIOGRAM (TEE);  Surgeon: Rexene Alberts, MD;  Location: [facility];  Service: Open Heart Surgery;  Laterality: N/A;    There were no vitals filed for this visit.   Subjective Assessment - 02/20/21 1018      Subjective "he's doing good with the cards"    Patient is accompained by: Family member   wife   Currently in Pain? Yes    Pain Score 2     Pain Location Foot                   ADULT SLP TREATMENT - 02/20/21 0001       General Information   Behavior/Cognition Alert;Cooperative;Pleasant mood;Requires cueing      Treatment Provided   Treatment provided Cognitive-Linquistic      Cognitive-Linquistic Treatment   Treatment focused on Aphasia;Apraxia    Skilled Treatment Pt and wife reported benefit of strategies and flashcards provided over last two ST sessions. SLP targeted sentence completions, word recall from description, and selecting associations from list. Pt completed 4/15 sentence starters independently, including reading and naming. With SLP assistance for reading starter, pt named 3 more items. With further min to mod verbal and visual cues, pt able to name remaining items. Pt able to name item from description with usual mod verbal and visual cues. SLP demonstrated how to incorporate gestures and visual aids to facilitate communication. Pt able to identify associated words with 100% accuracy. Usual A required to verbally name.      Assessment / Recommendations / Plan   Plan Continue with current plan of care      Progression Toward Goals   Progression toward goals Progressing  toward goals              SLP Education - 02/20/21 1224     Education Details HEP, strategies    Person(s) Educated Patient;Spouse    Methods Explanation;Demonstration;Verbal cues;Handout    Comprehension Verbalized understanding;Returned demonstration;Verbal cues required;Need further instruction              SLP Short Term Goals - 02/20/21 1036       SLP SHORT TERM GOAL #1   Title pt will functionally name 7 members of his family/close friends correctly with modified independence in 3 session    Baseline 1; 01-19-21, 01-23-21, 01-26-21    Status Achieved    Target Date  02/13/21      SLP SHORT TERM GOAL #2   Title pt will answer high interest mod complex yes/no questions about familiar topics with 80% success in 3 sessions    Baseline simple and min-mod complex questions 75%    Time 6    Period Weeks    Status Partially Met    Target Date 02/13/21      SLP SHORT TERM GOAL #3   Title pt will demo understanding of simple requests in a timely manner in 3 sessions    Baseline extra time consistently necessary; 01-19-21, 01-30-21, 02-09-21    Status Achieved    Target Date 02/13/21      SLP SHORT TERM GOAL #4   Title pt would demonstrate functional understanding in role-play situation/s for 80% success in 3 sessions    Baseline SLP needed to repeat and slow rate of speech to give pt success    Time 6    Period Weeks    Status Not Met    Target Date 02/13/21      SLP SHORT TERM GOAL #5   Title pt will demo functional articulation (no neologism) in 3 simple conversational turns in 2 sessions    Baseline marked neologism in conversation    Time 6    Period Weeks    Status Not Met    Target Date 02/13/21              SLP Long Term Goals - 02/20/21 1038       SLP LONG TERM GOAL #1   Title pt will demo understanding of verbal errors by restart or revision 80% of appropriate times, with modified independence, in 3 session    Time 12    Period Weeks    Status On-going    Target Date 04/05/21      SLP LONG TERM GOAL #2   Title pt will functionally communicate in 5 minutes simple conversation using compensations in 2 sessions    Baseline did not do so    Time 12    Period Weeks    Status On-going    Target Date 04/05/21      SLP LONG TERM GOAL #3   Title wife will demo understanding how best to cue pt for auditory comprehension in 3 sessions    Baseline not demonstrated    Time 12    Period Weeks    Status On-going    Target Date 04/05/21      SLP LONG TERM GOAL #4   Title wife will demo understanding how best to cue pt for verbal  expression in 3 sessions    Baseline not demonstrated    Time 12    Period Weeks    Status On-going    Target Date  04/05/21              Plan - 02/20/21 1029     Clinical Impression Statement Jesse [name]") presents today with severe aphasia (likely Wernicke's type) and apraxia. Pt is exhibiting some improvements for spontaneous speech, word level reading comprehension, and basic writing. SLP targeted reading and verbal expression this session, which some intermittent independent ability to read and name. SLP provided intermittent min to mod verbal and visual cues to aid naming, articulation, and reading. Pt will benefit from skilled ST targeting improved receptive and expressive language.    Speech Therapy Frequency 2x / week    Duration 12 weeks   or until Medicaid visits are exhausted   Treatment/Interventions Compensatory techniques;Functional tasks;Multimodal communcation approach;Cueing hierarchy;SLP instruction and feedback;Language facilitation;Cognitive reorganization;Compensatory strategies;Internal/external aids;Patient/family education    Potential to Achieve Goals Fair    Potential Considerations Severity of impairments;Financial resources    SLP Home Exercise Plan provided    Consulted and Agree with Plan of Care Patient;Family member/caregiver    Family Member Consulted wife             Patient will benefit from skilled therapeutic intervention in order to improve the following deficits and impairments:   Aphasia  Verbal apraxia    Problem List Patient Active Problem List   Diagnosis Date Noted   Altered sensation due to recent stroke 02/19/2021   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   AKI (acute kidney injury) ([place])    Benign essential HTN    Hyponatremia    Acute blood loss anemia    Urinary retention    Pressure injury of skin 11/17/2020   Respiratory arrest ([place]) 11/14/2020   Acute respiratory failure with hypoxia ([place]) 11/14/2020   Adverse effects  of medication 11/14/2020   Hypotension 11/14/2020   Encounter for central line placement    Aortic dissection ([place]) 11/13/2020   Acute ischemic right MCA stroke ([place]) 11/13/2020   Agitation 11/13/2020   Atelectasis    Phlebitis    Polysubstance abuse ([place])    Elevated troponin 11/07/2020   New onset left bundle branch block (LBBB) 11/07/2020   Protein-calorie malnutrition, severe 11/07/2020   Encephalopathy acute    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

## 2021-02-24 ENCOUNTER — Ambulatory Visit: Payer: Medicaid Other | Admitting: Occupational Therapy

## 2021-02-24 ENCOUNTER — Ambulatory Visit: Payer: Medicaid Other

## 2021-02-24 ENCOUNTER — Ambulatory Visit: Payer: Medicaid Other | Admitting: Physical Therapy

## 2021-02-27 ENCOUNTER — Ambulatory Visit: Payer: Medicaid Other | Attending: Physician Assistant

## 2021-02-27 ENCOUNTER — Other Ambulatory Visit: Payer: Self-pay

## 2021-02-27 DIAGNOSIS — R4701 Aphasia: Secondary | ICD-10-CM | POA: Insufficient documentation

## 2021-02-27 DIAGNOSIS — R482 Apraxia: Secondary | ICD-10-CM | POA: Diagnosis not present

## 2021-02-27 NOTE — Therapy (Signed)
[facility] [address] [place], [place], [address] Phone: [phone]   Fax:  [phone]  Speech Language Pathology Treatment  Patient Details  Name: Jesse [name] MRN: [id number] Date of Birth: 08/27/1975 Referring Provider (SLP): Lauraine Rinne, PA-C   Encounter Date: 02/27/2021   End of Session - 02/27/21 1019     Visit Number 12    Number of Visits 17    Date for SLP Re-Evaluation 04/05/21    Authorization Type Medicaid    Authorization - Visit Number 11    Authorization - Number of Visits 15    SLP Start Time 0233    SLP Stop Time  1100    SLP Time Calculation (min) 44 min    Activity Tolerance Patient tolerated treatment well             Past Medical History:  Diagnosis Date   Anxiety    Depression    Essential hypertension    Myocardial infarction ([place])    S/P aortic dissection repair 11/05/2020   Straight graft replacement of ascending aorta and proximal transverse aortic arch with re-suspension of native aortic valve and open hemi arch distal anastomosis with aorta to right common carotid bypass and aorta to right subclavian bypass   Stroke [facility])     Past Surgical History:  Procedure Laterality Date   ASCENDING AORTIC ROOT REPLACEMENT N/A 11/04/2020   Procedure: 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 WITH REPLACEMENT OF ASCENDING AORTA AND HEMIARCH USING HEMASHIELD PLATINUM 26MM GRAFT AND HEMASHIELD GOLD 14 X 8MM GRAFT, RESUSPENSION OF NATIVE VALVE, AORTA TO RIGHT CAROTID BYPASS, AORTA TO RIGHT SUBCLAVIAN BYPASS;  Surgeon: Rexene Alberts, MD;  Location: [facility];  Service: Open Heart Surgery;  Laterality: N/A;   TEE WITHOUT CARDIOVERSION N/A 11/04/2020   Procedure: TRANSESOPHAGEAL ECHOCARDIOGRAM (TEE);  Surgeon: Rexene Alberts, MD;  Location: [facility];  Service: Open Heart Surgery;  Laterality: N/A;    There were no vitals filed for this visit.   Subjective Assessment - 02/27/21 1153      Subjective "same old"    Patient is accompained by: Family member   wife   Currently in Pain? No/denies                   ADULT SLP TREATMENT - 02/27/21 1153       General Information   Behavior/Cognition Alert;Cooperative;Pleasant mood;Requires cueing      Treatment Provided   Treatment provided Cognitive-Linquistic      Cognitive-Linquistic Treatment   Treatment focused on Aphasia;Apraxia    Skilled Treatment Pt's wife reported successful conversation between pt and children, in which children provided positive reinforcement regarding increased communication ability. SLP targeted correction of errored word in sentence. Pt able to independently correct x1 given extra time. Initial phonemic cues were effective x6 and drawing effective x1. SLP targeted writing, in which pt noted with inconsistent errors (intermittent perseveration of /f/). Pt able to intermittently sound out words but inconsistent carryover for writing phoneme. Pt did generate own strategy for writing beginning and ending of word, which was effective x3.      Assessment / Recommendations / Plan   Plan Continue with current plan of care      Progression Toward Goals   Progression toward goals Progressing toward goals              SLP Education - 02/27/21 1157     Education Details multimodal comm, compensations, HEP  Person(s) Educated Patient;Spouse    Methods Explanation;Demonstration;Verbal cues;Handout    Comprehension Verbalized understanding;Returned demonstration;Verbal cues required;Need further instruction              SLP Short Term Goals - 02/27/21 1159       SLP SHORT TERM GOAL #1   Title pt will functionally name 7 members of his family/close friends correctly with modified independence in 3 session    Baseline 1; 01-19-21, 01-23-21, 01-26-21    Status Achieved    Target Date 02/13/21      SLP SHORT TERM GOAL #2   Title pt will answer high interest mod complex yes/no  questions about familiar topics with 80% success in 3 sessions    Baseline simple and min-mod complex questions 75%    Status Partially Met    Target Date 02/13/21      SLP SHORT TERM GOAL #3   Title pt will demo understanding of simple requests in a timely manner in 3 sessions    Baseline extra time consistently necessary; 01-19-21, 01-30-21, 02-09-21    Status Achieved    Target Date 02/13/21      SLP SHORT TERM GOAL #4   Title pt would demonstrate functional understanding in role-play situation/s for 80% success in 3 sessions    Baseline SLP needed to repeat and slow rate of speech to give pt success    Status Not Met    Target Date 02/13/21      SLP SHORT TERM GOAL #5   Title pt will demo functional articulation (no neologism) in 3 simple conversational turns in 2 sessions    Baseline marked neologism in conversation    Status Not Met    Target Date 02/13/21              SLP Long Term Goals - 02/27/21 1159       SLP LONG TERM GOAL #1   Title pt will demo understanding of verbal errors by restart or revision 80% of appropriate times, with modified independence, in 3 session    Baseline 02-27-21    Time 12    Period Weeks    Status On-going    Target Date 04/05/21      SLP LONG TERM GOAL #2   Title pt will functionally communicate in 5 minutes simple conversation using compensations in 2 sessions    Baseline did not do so    Time 12    Period Weeks    Status On-going    Target Date 04/05/21      SLP LONG TERM GOAL #3   Title wife will demo understanding how best to cue pt for auditory comprehension in 3 sessions    Baseline not demonstrated    Time 12    Period Weeks    Status On-going    Target Date 04/05/21      SLP LONG TERM GOAL #4   Title wife will demo understanding how best to cue pt for verbal expression in 3 sessions    Baseline not demonstrated; 02-27-21    Time 12    Period Weeks    Status On-going    Target Date 04/05/21               Plan - 02/27/21 1046     Clinical Impression Statement Jesse [name] ("Jesse [name]") presents today with severe aphasia (likely Wernicke's type) and apraxia. Pt is exhibiting some improvements for spontaneous speech, word level reading comprehension, and some basic writing. SLP  targeted verbal expression and writing at word level this session, which some intermittent independent ability exhibited. Pt benefited from phonemic cues and extra time to aid naming. SLP provided intermittent min to mod verbal and visual cues to aid writing at word level today. Pt will benefit from skilled ST targeting improved receptive and expressive language.    Speech Therapy Frequency 2x / week    Duration 12 weeks   or until Medicaid visits are exhausted   Treatment/Interventions Compensatory techniques;Functional tasks;Multimodal communcation approach;Cueing hierarchy;SLP instruction and feedback;Language facilitation;Cognitive reorganization;Compensatory strategies;Internal/external aids;Patient/family education    Potential to Achieve Goals Fair    Potential Considerations Severity of impairments;Financial resources    SLP Home Exercise Plan provided    Consulted and Agree with Plan of Care Patient;Family member/caregiver    Family Member Consulted wife             Patient will benefit from skilled therapeutic intervention in order to improve the following deficits and impairments:   Aphasia  Verbal apraxia    Problem List Patient Active Problem List   Diagnosis Date Noted   Altered sensation due to recent stroke 02/19/2021   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   AKI (acute kidney injury) ([place])    Benign essential HTN    Hyponatremia    Acute blood loss anemia    Urinary retention    Pressure injury of skin 11/17/2020   Respiratory arrest ([place]) 11/14/2020   Acute respiratory failure with hypoxia ([place]) 11/14/2020   Adverse effects of medication 11/14/2020   Hypotension 11/14/2020   Encounter for central line  placement    Aortic dissection ([place]) 11/13/2020   Acute ischemic right MCA stroke ([place]) 11/13/2020   Agitation 11/13/2020   Atelectasis    Phlebitis    Polysubstance abuse ([place])    Elevated troponin 11/07/2020   New onset left bundle branch block (LBBB) 11/07/2020   Protein-calorie malnutrition, severe 11/07/2020   Encephalopathy acute    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 [place], [place], [address] Phone: [phone]   Fax:  [phone]   Name: Jesse [name] MRN: [id number] Date of Birth: [birth year]

## 2021-03-06 ENCOUNTER — Other Ambulatory Visit: Payer: Self-pay | Admitting: Registered Nurse

## 2021-03-06 NOTE — Telephone Encounter (Signed)
Refill request for Quetiapine 50 mg. Is this okay? Last refill prescribed by Jacalyn Lefevre but not in your last note.

## 2021-03-06 NOTE — Telephone Encounter (Signed)
I seen this patient in September for HFU, Dr Wynn Banker seen him last. You may need to call him or family to see if PCP is prescribing.

## 2021-03-23 ENCOUNTER — Ambulatory Visit: Payer: Medicaid Other

## 2021-03-23 ENCOUNTER — Other Ambulatory Visit: Payer: Self-pay

## 2021-03-23 DIAGNOSIS — R4701 Aphasia: Secondary | ICD-10-CM

## 2021-03-23 DIAGNOSIS — R482 Apraxia: Secondary | ICD-10-CM

## 2021-03-23 NOTE — Therapy (Signed)
[facility] [address] [place], [place], [address] Phone: [phone]   Fax:  [phone]  Speech Language Pathology Treatment  Patient Details  Name: Jesse [name] MRN: [id number] Date of Birth: [birth year] Referring Provider (SLP): Lauraine Rinne, PA-C   Encounter Date: 03/23/2021   End of Session - 03/23/21 1134     Visit Number 13    Number of Visits 17    Date for SLP Re-Evaluation 04/05/21    Authorization Type Medicaid    Authorization - Visit Number 12    Authorization - Number of Visits 15    SLP Start Time 1100    SLP Stop Time  1540    SLP Time Calculation (min) 45 min    Activity Tolerance Patient tolerated treatment well             Past Medical History:  Diagnosis Date   Anxiety    Depression    Essential hypertension    Myocardial infarction ([place])    S/P aortic dissection repair 11/05/2020   Straight graft replacement of ascending aorta and proximal transverse aortic arch with re-suspension of native aortic valve and open hemi arch distal anastomosis with aorta to right common carotid bypass and aorta to right subclavian bypass   Stroke [facility])     Past Surgical History:  Procedure Laterality Date   ASCENDING AORTIC ROOT REPLACEMENT N/A 11/04/2020   Procedure: 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 WITH REPLACEMENT OF ASCENDING AORTA AND HEMIARCH USING HEMASHIELD PLATINUM 26MM GRAFT AND HEMASHIELD GOLD 14 X 8MM GRAFT, RESUSPENSION OF NATIVE VALVE, AORTA TO RIGHT CAROTID BYPASS, AORTA TO RIGHT SUBCLAVIAN BYPASS;  Surgeon: Rexene Alberts, MD;  Location: [facility];  Service: Open Heart Surgery;  Laterality: N/A;   TEE WITHOUT CARDIOVERSION N/A 11/04/2020   Procedure: TRANSESOPHAGEAL ECHOCARDIOGRAM (TEE);  Surgeon: Rexene Alberts, MD;  Location: [facility];  Service: Open Heart Surgery;  Laterality: N/A;    There were no vitals filed for this visit.   Subjective Assessment - 03/23/21 1102      Subjective "I think he seems better" per wife    Patient is accompained by: Family member    Currently in Pain? No/denies                   ADULT SLP TREATMENT - 03/23/21 1103       General Information   Behavior/Cognition Alert;Cooperative;Pleasant mood;Requires cueing      Treatment Provided   Treatment provided Cognitive-Linquistic      Cognitive-Linquistic Treatment   Treatment focused on Aphasia;Apraxia    Skilled Treatment Pt returned after nearly 4 weeks off therapy. Pt endorsed similiar verbal expression, whereas wife indicated some improvements in speech. Ongoing frustration indicated. SLP introduced Chartered loss adjuster (SFA) and Agricultural consultant (VNeST) this session. Consistent max A needed for SFA. Occasional min to mod A required for VNeST for identifying subjects and expanding sentences. Pt benefited from inital phonetic prompts as pt exhibited inconsistent verbal productions.      Assessment / Recommendations / Plan   Plan Continue with current plan of care      Progression Toward Goals   Progression toward goals Progressing toward goals              SLP Education - 03/23/21 1133     Education Details VNeST, SFA    Person(s) Educated Patient;Spouse    Methods Explanation;Demonstration;Handout;Verbal cues    Comprehension Verbalized understanding;Returned demonstration;Need further instruction  SLP Short Term Goals - 02/27/21 1159       SLP SHORT TERM GOAL #1   Title pt will functionally name 7 members of his family/close friends correctly with modified independence in 3 session    Baseline 1; 01-19-21, 01-23-21, 01-26-21    Status Achieved    Target Date 02/13/21      SLP SHORT TERM GOAL #2   Title pt will answer high interest mod complex yes/no questions about familiar topics with 80% success in 3 sessions    Baseline simple and min-mod complex questions 75%    Status Partially Met    Target Date  02/13/21      SLP SHORT TERM GOAL #3   Title pt will demo understanding of simple requests in a timely manner in 3 sessions    Baseline extra time consistently necessary; 01-19-21, 01-30-21, 02-09-21    Status Achieved    Target Date 02/13/21      SLP SHORT TERM GOAL #4   Title pt would demonstrate functional understanding in role-play situation/s for 80% success in 3 sessions    Baseline SLP needed to repeat and slow rate of speech to give pt success    Status Not Met    Target Date 02/13/21      SLP SHORT TERM GOAL #5   Title pt will demo functional articulation (no neologism) in 3 simple conversational turns in 2 sessions    Baseline marked neologism in conversation    Status Not Met    Target Date 02/13/21              SLP Long Term Goals - 03/23/21 [place] #1   Title pt will demo understanding of verbal errors by restart or revision 80% of appropriate times, with modified independence, in 3 session    Baseline 02-27-21, 03-23-21    Time 12    Period Weeks    Status On-going    Target Date 04/05/21      SLP LONG TERM GOAL #2   Title pt will functionally communicate in 5 minutes simple conversation using compensations in 2 sessions    Baseline did not do so    Time 12    Period Weeks    Status On-going    Target Date 04/05/21      SLP LONG TERM GOAL #3   Title wife will demo understanding how best to cue pt for auditory comprehension in 3 sessions    Baseline not demonstrated    Time 12    Period Weeks    Status On-going    Target Date 04/05/21      SLP LONG TERM GOAL #4   Title wife will demo understanding how best to cue pt for verbal expression in 3 sessions    Baseline not demonstrated; 02-27-21    Time 12    Period Weeks    Status On-going    Target Date 04/05/21              Plan - 03/23/21 1413     Clinical Impression Statement Jesse [name] ("Jesse [name]") presents today with moderate to severe aphasia (likely Wernicke's type)  and apraxia. Pt is exhibiting some improvements for spontaneous speech, word level reading, and some basic writing. SLP targeted verbal expression via SFA and VNeST. VNeST was most successful with intermittent min to mod A required. Pt benefited from phonemic cues and extra time to aid naming. SLP provided  intermittent min to mod verbal and visual cues to aid writing at word level today. Pt will benefit from skilled ST targeting improved receptive and expressive language.    Speech Therapy Frequency 2x / week    Duration 12 weeks   or until Medicaid visits are exhausted   Treatment/Interventions Compensatory techniques;Functional tasks;Multimodal communcation approach;Cueing hierarchy;SLP instruction and feedback;Language facilitation;Cognitive reorganization;Compensatory strategies;Internal/external aids;Patient/family education    Potential to Achieve Goals Fair    Potential Considerations Severity of impairments;Financial resources    SLP Home Exercise Plan provided    Consulted and Agree with Plan of Care Patient;Family member/caregiver    Family Member Consulted wife             Patient will benefit from skilled therapeutic intervention in order to improve the following deficits and impairments:   Aphasia  Verbal apraxia    Problem List Patient Active Problem List   Diagnosis Date Noted   Altered sensation due to recent stroke 02/19/2021   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   AKI (acute kidney injury) ([place])    Benign essential HTN    Hyponatremia    Acute blood loss anemia    Urinary retention    Pressure injury of skin 11/17/2020   Respiratory arrest ([place]) 11/14/2020   Acute respiratory failure with hypoxia ([place]) 11/14/2020   Adverse effects of medication 11/14/2020   Hypotension 11/14/2020   Encounter for central line placement    Aortic dissection ([place]) 11/13/2020   Acute ischemic right MCA stroke ([place]) 11/13/2020   Agitation 11/13/2020   Atelectasis    Phlebitis     Polysubstance abuse ([place])    Elevated troponin 11/07/2020   New onset left bundle branch block (LBBB) 11/07/2020   Protein-calorie malnutrition, severe 11/07/2020   Encephalopathy acute    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)    Acute hypoxemic respiratory failure ([place])    Acute thoracic aortic dissection 11/05/2020   S/P aortic dissection repair 11/05/2020   Cerebral embolism with cerebral infarction 11/05/2020   Essential hypertension     Alinda Deem, CCC-SLP 03/23/2021, 2:15 PM  [facility] [address] [place], [place], [address] Phone: [phone]   Fax:  [phone]   Name: Jesse [name] MRN: [id number] Date of Birth: [birth year]

## 2021-03-30 ENCOUNTER — Ambulatory Visit: Payer: Medicaid Other

## 2021-04-03 NOTE — Progress Notes (Signed)
Cardiology Office Note:    Date:  04/08/2021   ID:  Jesse [name], DOB Sep 09, 1975, MRN [id number]  PCP:  Georganna Skeans, MD  Cardiologist:  Chrystie Nose, MD   Referring MD: Georganna Skeans, MD   Chief Complaint  Patient presents with   Follow-up    Aortic dissection repair     History of Present Illness:    Jesse [name] is a 45 y.o. 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  He underwent emergent Hemi arch repair with a deep branching Y graft to the right carotid and subclavian arteries on 11/04/2020.  He had residual paresthesias in his right hand and his feet.  He is working with therapy for this.  He established care with Dr. Rennis Golden on 12/25/2020.  He had had episodes of hypotension and dizziness.  He was seen in urgent care for this and found to have a UTI.  Antihypertensives have been reduced: Hydralazine was discontinued and amlodipine was reduced to 2.5 mg.  He was also on 0.8 mg tamsulosin for chronic urinary retention.  Dr. Rennis Golden felt he had clinical features consistent with connective tissue disorder and referred him to genetics clinic for testing of aortopathy's.  He presents today for routine follow-up. They saw genetics counselor with Dr. Sidney Ace PHD on 02/03/21. Blood was drawn, but not processed yet, I believe out of concer for establishing life insurance and disability for Neale and their three children.   Blood pressure is not controlled- 160/70, reportedly 150s at home.  In September, he was having orthostatic hypotension and medications were reduced: hydralazine 25 mg TID and 5 mg amlodipine. He is now on 2.5 mg amlodipine and 3.125 mg coreg. They state he is sluggish, but has just started gabapentin 100 mg BID.    Past Medical History:  Diagnosis Date   Anxiety    Depression    Essential hypertension    Myocardial infarction [facility])    S/P aortic dissection repair 11/05/2020   Straight graft replacement of ascending aorta and proximal  transverse aortic arch with re-suspension of native aortic valve and open hemi arch distal anastomosis with aorta to right common carotid bypass and aorta to right subclavian bypass   Stroke [facility])     Past Surgical History:  Procedure Laterality Date   ASCENDING AORTIC ROOT REPLACEMENT N/A 11/04/2020   Procedure: 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 WITH REPLACEMENT OF ASCENDING AORTA AND HEMIARCH USING HEMASHIELD PLATINUM GRAFT AND HEMASHIELD GOLD 14 X GRAFT, RESUSPENSION OF NATIVE VALVE, AORTA TO RIGHT CAROTID BYPASS, AORTA TO RIGHT SUBCLAVIAN BYPASS;  Surgeon: Purcell Nails, MD;  Location: [facility] OR;  Service: Open Heart Surgery;  Laterality: N/A;   TEE WITHOUT CARDIOVERSION N/A 11/04/2020   Procedure: TRANSESOPHAGEAL ECHOCARDIOGRAM (TEE);  Surgeon: Purcell Nails, MD;  Location: [facility] OR;  Service: Open Heart Surgery;  Laterality: N/A;    Current Medications: Current Meds  Medication Sig   acetaminophen (TYLENOL) 325 MG tablet Take 1-2 tablets (325-650 mg total) by mouth every 4 (four) hours as needed for mild pain.   amLODipine (NORVASC) 5 MG tablet Take 2.5 mg by mouth daily.   aspirin 81 MG chewable tablet Chew 1 tablet (81 mg total) by mouth daily.   carvedilol (COREG) 3.125 MG tablet TAKE 1 TABLET TWICE DAILY WITH MEALS   ferrous sulfate 325 (65 FE) MG tablet Take 1 tablet (325 mg total) by mouth daily.   gabapentin (NEURONTIN) 100 MG capsule Take  1 capsule (100 mg total) by mouth 2 (two) times daily.   mirtazapine (REMERON) 7.5 MG tablet Take 1 tablet (7.5 mg total) by mouth at bedtime.   nitrofurantoin, macrocrystal-monohydrate, (MACROBID) 100 MG capsule Take 100 mg by mouth 2 (two) times daily.   pantoprazole sodium (PROTONIX) 40 mg/20 mL PACK Dissolve 1 packet in 20 mLs (40 mg total) and take by mouth daily.   polyethylene glycol (MIRALAX / GLYCOLAX) 17 g packet Take 17 g by mouth daily as needed for moderate constipation.   QUEtiapine (SEROQUEL) 50 MG tablet TAKE 1  TABLET BY MOUTH AT BEDTIME. AND 1/2 TABLET DURING THE DAY AS NEEDED FOR AGITATION   rosuvastatin (CRESTOR) 20 MG tablet Take 1 tablet (20 mg total) by mouth daily.   tamsulosin (FLOMAX) 0.4 MG CAPS capsule Take 2 capsules (0.8 mg total) by mouth daily after supper.   Zinc Sulfate 220 (50 Zn) MG TABS Take 1 tablet (220 mg total) by mouth daily.     Allergies:   Patient has no known allergies.   Social History   Socioeconomic History   Marital status: Married    Spouse name: Not on file   Number of children: Not on file   Years of education: Not on file   Highest education level: 9th grade  Occupational History   Not on file  Tobacco Use   Smoking status: Former    Types: Cigarettes    Quit date: 11/04/2020    Years since quitting: 0.4   Smokeless tobacco: Never  Vaping Use   Vaping Use: Never used  Substance and Sexual Activity   Alcohol use: Yes    Alcohol/week: 2.0 standard drinks    Types: 2 Cans of beer per week   Drug use: Not Currently   Sexual activity: Yes  Other Topics Concern   Not on file  Social History Narrative   Not on file   Social Determinants of Health   Financial Resource Strain: Not on file  Food Insecurity: Not on file  Transportation Needs: Not on file  Physical Activity: Not on file  Stress: Not on file  Social Connections: Not on file     Family History: The patient's family history is not on file.  ROS:   Please see the history of present illness.     All other systems reviewed and are negative.  EKGs/Labs/Other Studies Reviewed:    The following studies were reviewed today:  Echo 11/14/20: 1. Limited study; not all views obtained and doppler incomplete; normal  LV function; small pericardial effusion (most prominent in posterior  distribution); no evidence of tamponade.   2. Left ventricular ejection fraction, by estimation, is 55 to 60%. The  left ventricle has normal function. The left ventricle has no regional  wall motion  abnormalities. Left ventricular diastolic function could not  be evaluated.   3. Right ventricular systolic function is normal. The right ventricular  size is normal.   4. A small pericardial effusion is present. The pericardial effusion is  posterior to the left ventricle.   5. The mitral valve is normal in structure. No evidence of mitral valve  regurgitation. No evidence of mitral stenosis.   6. The aortic valve is tricuspid. Aortic valve regurgitation is trivial.   7. The inferior vena cava is dilated in size with >50% respiratory  variability, suggesting right atrial pressure of 8 mmHg.   EKG:  EKG is not ordered today.    Recent Labs: 11/14/2020: B Natriuretic Peptide  353.0 11/20/2020: Magnesium 2.6 11/28/2020: ALT 27 12/08/2020: BUN 18; Creatinine, Ser 1.16; Hemoglobin 11.0; Platelets 243; Potassium 4.3; Sodium 137  Recent Lipid Panel    Component Value Date/Time   CHOL 112 11/06/2020 0510   TRIG 114 11/15/2020 0348   HDL 24 (L) 11/06/2020 0510   CHOLHDL 4.7 11/06/2020 0510   VLDL 39 11/06/2020 0510   LDLCALC 49 11/06/2020 0510    Physical Exam:    VS:  BP (!) 160/70 (BP Location: Left Arm, Patient Position: Sitting, Cuff Size: Normal)   Pulse 62   Ht 6\' 4"  (1.93 m)   Wt 147 lb (66.7 kg)   SpO2 96%   BMI 17.89 kg/m     Wt Readings from Last 3 Encounters:  04/08/21 147 lb (66.7 kg)  02/19/21 142 lb 6.4 oz (64.6 kg)  02/17/21 140 lb (63.5 kg)     GEN: tall male, Well nourished, well developed in no acute distress HEENT: Normal NECK: No JVD; No carotid bruits LYMPHATICS: No lymphadenopathy CARDIAC: RRR, no murmurs, rubs, gallops RESPIRATORY:  Clear to auscultation without rales, wheezing or rhonchi  ABDOMEN: Soft, non-tender, non-distended MUSCULOSKELETAL:  No edema; No deformity  SKIN: Warm and dry NEUROLOGIC:  Alert and oriented x 3 PSYCHIATRIC:  Normal affect   ASSESSMENT:    1. Dissection of thoracic aorta, unspecified part   2. Acute ischemic right MCA  stroke ([place])   3. Hypotension, unspecified hypotension type   4. Orthostatic hypotension   5. Benign essential HTN   6. Urinary retention   7. Genetic testing   8. Neuropathy    PLAN:    In order of problems listed above:  Right hemispheric stroke with aphasia Type II aortic dissection -Repair was successful and he discharged to inpatient rehab   Positional dizziness Orthostatic hypotension Hypertension Now on 2.5 mg amlodipine and 3.125 mg coreg BID. Pressure is not controlled, having less orthostasis right now. I will increase amlodipine to 2.5 mg BID. Continue BP log.    Urinary retention requiring Foley catheter Resolved, no longer with foley catheter.    Genetics testing Awaiting labs after getting life insurance and disability for him and his children, especially his son.    Neuropathy In both feet and fingers. On gabapentin, unclear if this is helping, but is making him sluggish.    Follow up with Dr. Debara Pickett in 3 months.   They will contact us if SBP consistently greater than 140 --> will likely continued uptitrating amlodipine.   Continue working with therapies. I am concerned about his 94 yo son with marfan-like features. Unfortunately, he is 89 and does not qualify for medicaid services. He works 38-39 hours at [employer], they have not increased his hours so he can get health insurance. I recommended getting him into [facility] for BP control.    Medication Adjustments/Labs and Tests Ordered: Current medicines are reviewed at length with the patient today.  Concerns regarding medicines are outlined above.  No orders of the defined types were placed in this encounter.  No orders of the defined types were placed in this encounter.   Signed, Ledora Bottcher, PA  04/08/2021 2:21 PM    [facility]

## 2021-04-06 ENCOUNTER — Ambulatory Visit: Payer: Medicaid Other | Attending: Physician Assistant

## 2021-04-06 ENCOUNTER — Other Ambulatory Visit: Payer: Self-pay | Admitting: Physical Medicine & Rehabilitation

## 2021-04-06 ENCOUNTER — Other Ambulatory Visit: Payer: Self-pay

## 2021-04-06 DIAGNOSIS — R4701 Aphasia: Secondary | ICD-10-CM | POA: Diagnosis present

## 2021-04-06 DIAGNOSIS — R482 Apraxia: Secondary | ICD-10-CM | POA: Diagnosis present

## 2021-04-06 NOTE — Patient Instructions (Addendum)
Workbooks:  -Facilities manager Therapy Aphasia Rehabilitation *Star* Workbook (~$34) -Stroke Recovery Activity Book: Great for Aphasia Recovery (~$9) -Stroke Recovery Activity Book: Aphasia Rehabilitation (~$10)  Practice every single day. Do a little writing, reading, and speaking.   You are doing a good job! Do not get down on yourself. Think about other ways to communication (write, gesture, draw)

## 2021-04-06 NOTE — Therapy (Signed)
[facility] [address] [place], [place], [address] Phone: [phone]   Fax:  [phone]  [name] Language Pathology Treatment/Discharge  Patient Details  Name: Jesse [name] MRN: [id number] Date of Birth: Aug 24, 1975 Referring Provider (SLP): Jesse Rinne, PA-C   Encounter Date: 04/06/2021   End of Session - 04/06/21 1018     Visit Number 14    Number of Visits 17    Date for SLP Re-Evaluation 04/05/21   extended 1 day for discharge   Authorization Type Medicaid    Authorization - Visit Number 77    Authorization - Number of Visits 15    SLP Start Time 9038    SLP Stop Time  1100    SLP Time Calculation (min) 41 min    Activity Tolerance Patient tolerated treatment well             Past Medical History:  Diagnosis Date   Anxiety    Depression    Essential hypertension    Myocardial infarction ([place])    S/P aortic dissection repair 11/05/2020   Straight graft replacement of ascending aorta Jesse proximal transverse aortic arch with re-suspension of native aortic valve Jesse open hemi arch distal anastomosis with aorta to right common carotid bypass Jesse aorta to right subclavian bypass   Stroke [facility])     Past Surgical History:  Procedure Laterality Date   ASCENDING AORTIC ROOT REPLACEMENT N/A 11/04/2020   Procedure: 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 WITH REPLACEMENT OF ASCENDING AORTA Jesse HEMIARCH USING HEMASHIELD PLATINUM 26MM GRAFT Jesse HEMASHIELD GOLD 14 X 8MM GRAFT, RESUSPENSION OF NATIVE VALVE, AORTA TO RIGHT CAROTID BYPASS, AORTA TO RIGHT SUBCLAVIAN BYPASS;  Surgeon: Rexene Alberts, MD;  Location: [place];  Service: Open Heart Surgery;  Laterality: N/A;   TEE WITHOUT CARDIOVERSION N/A 11/04/2020   Procedure: TRANSESOPHAGEAL ECHOCARDIOGRAM (TEE);  Surgeon: Rexene Alberts, MD;  Location: [place];  Service: Open Heart Surgery;  Laterality: N/A;    There were no vitals filed for this visit.    Subjective Assessment - 04/06/21 1019     Subjective "I think he did pretty good talking with my family" per wife    Patient is accompained by: Family member    Currently in Pain? No/denies             [name] THERAPY DISCHARGE SUMMARY  Visits from Start of Care: 14  Current functional level related to goals / functional outcomes: [name] exhibited mild improvements in expressive Jesse receptive language; however, mod-severe aphasia Jesse apraxia persists.  Pt benefits occasional to usual phonemic cues, semantic cues, first letter cues, Jesse sentence starters to aid naming Jesse articulation. Pt also benefits from extra processing time, repetition, Jesse rephrasing to increase comprehension. Family requested short break rom therapy due to holiday season but would like to be re-evaluated Jesse treated in the new year.    Remaining deficits: Mod-severe aphasia Jesse apraxia    Education / Equipment: Multimodal communication, anomia strategies, comprehension techniques, HEP, caregiver education   Patient agrees to discharge. Patient goals were partially met. Patient is being discharged due to  financial limitations Jesse requested break for holidays..         ADULT SLP TREATMENT - 04/06/21 1447       General Information   Behavior/Cognition Alert;Cooperative;Pleasant mood;Requires cueing      Treatment Provided   Treatment provided Cognitive-Linquistic      Cognitive-Linquistic Treatment   Treatment focused on Aphasia;Apraxia  Skilled Treatment Inconsistent carryover of HEP reported due to recent events. Frustration with communication is ongoing. SLP reviewed [name] compensations Jesse multimodal techniques to utilize at home. SLP targeted functional naming Jesse writing related to personally relevant topic. Pt benefited from usual phonemic Jesse semantic cues for articulation, naming, Jesse writing. Family requested discharge this date due to upcoming holidays. Pt would benefit from re-evaluation  Jesse treatment in the new year after short break from therapy due to scheduling Jesse financial limitations      Assessment / Recommendations / Plan   Plan Discharge SLP treatment due to (comment)   POC complete     Progression Toward Goals   Progression toward goals Goals partially met, education completed, patient discharged from [facility] Education - 04/06/21 1452     Education Details HEP    Person(s) Educated Patient;Spouse    Methods Explanation;Demonstration;Handout    Comprehension Verbalized understanding;Returned demonstration              SLP Short Term Goals - 02/27/21 1159       [place] #1   Title pt will functionally name 7 members of his family/close friends correctly with modified independence in 3 session    Baseline 1; 01-19-21, 01-23-21, 01-26-21    Status Achieved    Target Date 02/13/21      SLP SHORT TERM GOAL #2   Title pt will answer high interest mod complex yes/no questions about familiar topics with 80% success in 3 sessions    Baseline simple Jesse min-mod complex questions 75%    Status Partially Met    Target Date 02/13/21      SLP SHORT TERM GOAL #3   Title pt will demo understanding of simple requests in a timely manner in 3 sessions    Baseline extra time consistently necessary; 01-19-21, 01-30-21, 02-09-21    Status Achieved    Target Date 02/13/21      SLP SHORT TERM GOAL #4   Title pt would demonstrate functional understanding in role-play situation/s for 80% success in 3 sessions    Baseline SLP needed to repeat Jesse slow rate of [name] to give pt success    Status Not Met    Target Date 02/13/21      SLP SHORT TERM GOAL #5   Title pt will demo functional articulation (no neologism) in 3 simple conversational turns in 2 sessions    Baseline marked neologism in conversation    Status Not Met    Target Date 02/13/21              SLP Long Term Goals - 04/06/21 [place] #1   Title  pt will demo understanding of verbal errors by restart or revision 80% of appropriate times, with modified independence, in 3 session    Baseline 02-27-21, 03-23-21, 04-06-21    Status Achieved      SLP LONG TERM GOAL #2   Title pt will functionally communicate in 5 minutes simple conversation using compensations in 2 sessions    Baseline did not do so    Status Partially Met      SLP LONG TERM GOAL #3   Title wife will demo understanding how best to cue pt for auditory comprehension in 3 sessions    Baseline not demonstrated    Status Partially Met  SLP LONG TERM GOAL #4   Title wife will demo understanding how best to cue pt for verbal expression in 3 sessions    Baseline not demonstrated; 02-27-21, 04-06-21    Status Partially Met              Plan - 04/06/21 1453     Clinical Impression Statement Jesse [name], Jesse [name], Jesse some basic writing. SLP provided usual min to mod verbal Jesse visual cues to aid articulation, naming, Jesse writing today. Pt Jesse family requested discharge today for break during the holidays. Pt would likely benefit from re-eval Jesse treatment in new year to continue to target receptive Jesse expressive language.    Treatment/Interventions Compensatory techniques;Functional tasks;Multimodal communcation approach;Cueing hierarchy;SLP instruction Jesse feedback;Language facilitation;Cognitive reorganization;Compensatory strategies;Internal/external aids;Patient/family education    Potential to Achieve Goals Fair    Potential Considerations Severity of impairments;Financial resources    Consulted Jesse Agree with Plan of Care Patient;Family member/caregiver             Patient will benefit from skilled therapeutic intervention in order to improve the following deficits Jesse impairments:    Aphasia  Verbal apraxia    Problem List Patient Active Problem List   Diagnosis Date Noted   Altered sensation due to recent stroke 02/19/2021   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   AKI (acute kidney injury) ([place])    Benign essential HTN    Hyponatremia    Acute blood loss anemia    Urinary retention    Pressure injury of skin 11/17/2020   Respiratory arrest ([place]) 11/14/2020   Acute respiratory failure with hypoxia ([place]) 11/14/2020   Adverse effects of medication 11/14/2020   Hypotension 11/14/2020   Encounter for central line placement    Aortic dissection ([place]) 11/13/2020   Acute ischemic right MCA stroke ([place]) 11/13/2020   Agitation 11/13/2020   Atelectasis    Phlebitis    Polysubstance abuse ([place])    Elevated troponin 11/07/2020   New onset left bundle branch block (LBBB) 11/07/2020   Protein-calorie malnutrition, severe 11/07/2020   Encephalopathy acute    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)    Acute hypoxemic respiratory failure ([place])    Acute thoracic aortic dissection 11/05/2020   S/P aortic dissection repair 11/05/2020   Cerebral embolism with cerebral infarction 11/05/2020   Essential hypertension     Alinda Deem, CCC-SLP 04/06/2021, 3:02 PM  [facility] [address] [place], [place], [address] Phone: [phone]   Fax:  [phone]   Name: Marx Doig MRN: [id number] Date of Birth: 02/03/1976

## 2021-04-08 ENCOUNTER — Encounter: Payer: Self-pay | Admitting: Physician Assistant

## 2021-04-08 ENCOUNTER — Ambulatory Visit: Payer: Medicaid Other | Admitting: Physician Assistant

## 2021-04-08 ENCOUNTER — Other Ambulatory Visit: Payer: Self-pay

## 2021-04-08 VITALS — BP 160/70 | HR 62 | Ht 76.0 in | Wt 147.0 lb

## 2021-04-08 DIAGNOSIS — I63511 Cerebral infarction due to unspecified occlusion or stenosis of right middle cerebral artery: Secondary | ICD-10-CM | POA: Diagnosis not present

## 2021-04-08 DIAGNOSIS — Z1379 Encounter for other screening for genetic and chromosomal anomalies: Secondary | ICD-10-CM | POA: Diagnosis not present

## 2021-04-08 DIAGNOSIS — I959 Hypotension, unspecified: Secondary | ICD-10-CM | POA: Diagnosis not present

## 2021-04-08 DIAGNOSIS — R339 Retention of urine, unspecified: Secondary | ICD-10-CM | POA: Diagnosis not present

## 2021-04-08 DIAGNOSIS — G629 Polyneuropathy, unspecified: Secondary | ICD-10-CM

## 2021-04-08 DIAGNOSIS — I951 Orthostatic hypotension: Secondary | ICD-10-CM

## 2021-04-08 DIAGNOSIS — I71019 Dissection of thoracic aorta, unspecified: Secondary | ICD-10-CM | POA: Diagnosis not present

## 2021-04-08 DIAGNOSIS — I10 Essential (primary) hypertension: Secondary | ICD-10-CM | POA: Diagnosis not present

## 2021-04-08 MED ORDER — AMLODIPINE BESYLATE 5 MG PO TABS: 2.5000 mg | ORAL_TABLET | Freq: Two times a day (BID) | ORAL | 3 refills | Status: DC

## 2021-04-08 NOTE — Patient Instructions (Signed)
Medication Instructions:  INCREASE Amlodipine to 2.5 mg 2 times a day *If you need a refill on your cardiac medications before your next appointment, please call your pharmacy*  Lab Work: NONE ordered at this time of appointment   If you have labs (blood work) drawn today and your tests are completely normal, you will receive your results only by: MyChart Message (if you have MyChart) OR A paper copy in the mail If you have any lab test that is abnormal or we need to change your treatment, we will call you to review the results.  Testing/Procedures: NONE ordered at this time of appointment   Follow-Up: At [facility], you and your health needs are our priority.  As part of our continuing mission to provide you with exceptional heart care, we have created designated Provider Care Teams.  These Care Teams include your primary Cardiologist (physician) and Advanced Practice Providers (APPs -  Physician Assistants and Nurse Practitioners) who all work together to provide you with the care you need, when you need it.  Your next appointment:   3 month(s)  The format for your next appointment:   In Person  Provider:   Chrystie Nose, MD  or Micah Flesher, PA-C       Other Instructions Continue to monitor blood pressure at home. If Systolic (top number) is persistently greater than 140 please give our office a call.

## 2021-04-28 DIAGNOSIS — Z0271 Encounter for disability determination: Secondary | ICD-10-CM

## 2021-04-29 ENCOUNTER — Ambulatory Visit: Payer: Medicaid Other | Admitting: Family Medicine

## 2021-04-29 ENCOUNTER — Other Ambulatory Visit: Payer: Self-pay

## 2021-04-29 VITALS — BP 120/87 | HR 81 | Temp 97.9°F | Resp 16 | Wt 144.0 lb

## 2021-04-29 DIAGNOSIS — F329 Major depressive disorder, single episode, unspecified: Secondary | ICD-10-CM | POA: Diagnosis not present

## 2021-04-29 DIAGNOSIS — I10 Essential (primary) hypertension: Secondary | ICD-10-CM

## 2021-04-29 DIAGNOSIS — Z8673 Personal history of transient ischemic attack (TIA), and cerebral infarction without residual deficits: Secondary | ICD-10-CM | POA: Diagnosis not present

## 2021-04-29 MED ORDER — TAMSULOSIN HCL 0.4 MG PO CAPS: 0.8000 mg | ORAL_CAPSULE | Freq: Every day | ORAL | 1 refills | Status: DC

## 2021-04-29 NOTE — Progress Notes (Signed)
Patient is here for follow-up HTN. ?Patient has no other concerns for provider today ?

## 2021-04-29 NOTE — Progress Notes (Signed)
Established Patient Office Visit  Subjective:  Patient ID: Jesse [name], male    DOB: 23-Feb-1976  Age: 46 y.o. MRN: [id number]  CC:  Chief Complaint  Patient presents with   Follow-up   Hypertension    HPI Angad Roesel presents for follow up of chronic med issues. Patient  reports that he continues to improve s/p CVA with PT and speech therapy. Patient denies acute complaints or concerns.   Past Medical History:  Diagnosis Date   Anxiety    Depression    Essential hypertension    Myocardial infarction [facility])    S/P aortic dissection repair 11/05/2020   Straight graft replacement of ascending aorta and proximal transverse aortic arch with re-suspension of native aortic valve and open hemi arch distal anastomosis with aorta to right common carotid bypass and aorta to right subclavian bypass   Stroke [facility])     Past Surgical History:  Procedure Laterality Date   ASCENDING AORTIC ROOT REPLACEMENT N/A 11/04/2020   Procedure: 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 WITH REPLACEMENT OF ASCENDING AORTA AND HEMIARCH USING HEMASHIELD PLATINUM 26MM GRAFT AND HEMASHIELD GOLD 14 X 8MM GRAFT, RESUSPENSION OF NATIVE VALVE, AORTA TO RIGHT CAROTID BYPASS, AORTA TO RIGHT SUBCLAVIAN BYPASS;  Surgeon: Rexene Alberts, MD;  Location: [place];  Service: Open Heart Surgery;  Laterality: N/A;   TEE WITHOUT CARDIOVERSION N/A 11/04/2020   Procedure: TRANSESOPHAGEAL ECHOCARDIOGRAM (TEE);  Surgeon: Rexene Alberts, MD;  Location: [place];  Service: Open Heart Surgery;  Laterality: N/A;    No family history on file.  Social History   Socioeconomic History   Marital status: Married    Spouse name: Not on file   Number of children: Not on file   Years of education: Not on file   Highest education level: 9th grade  Occupational History   Not on file  Tobacco Use   Smoking status: Former    Types: Cigarettes    Quit date: 11/04/2020    Years since quitting: 0.4   Smokeless tobacco: Never   Vaping Use   Vaping Use: Never used  Substance and Sexual Activity   Alcohol use: Yes    Alcohol/week: 2.0 standard drinks    Types: 2 Cans of beer per week   Drug use: Not Currently   Sexual activity: Yes  Other Topics Concern   Not on file  Social History Narrative   Not on file   Social Determinants of Health   Financial Resource Strain: Not on file  Food Insecurity: Not on file  Transportation Needs: Not on file  Physical Activity: Not on file  Stress: Not on file  Social Connections: Not on file  Intimate Partner Violence: Not on file    ROS Review of Systems  Neurological:  Positive for weakness and numbness.  Psychiatric/Behavioral:  Positive for sleep disturbance. Negative for self-injury and suicidal ideas. The patient is nervous/anxious.   All other systems reviewed and are negative.  Objective:   Today's Vitals: BP 120/87    Pulse 81    Temp 97.9 F (36.6 C) (Oral)    Resp 16    Wt 144 lb (65.3 kg)    SpO2 96%    BMI 17.53 kg/m   Physical Exam Vitals and nursing note reviewed.  Constitutional:      General: He is not in acute distress. HENT:     Head: Normocephalic and atraumatic.  Cardiovascular:     Rate and Rhythm: Normal rate and regular rhythm.  Pulmonary:     Effort: Pulmonary effort is normal.     Breath sounds: Normal breath sounds.  Abdominal:     Palpations: Abdomen is soft.     Tenderness: There is no abdominal tenderness.  Neurological:     General: No focal deficit present.     Mental Status: He is alert and oriented to person, place, and time.     Motor: Weakness present.  Psychiatric:        Mood and Affect: Mood normal.        Behavior: Behavior normal.    Assessment & Plan:   1. History of CVA (cerebrovascular accident) Continues to improved. Updated referrals for continued therapy  - Ambulatory referral to Speech Therapy - Ambulatory referral to Physical Therapy  2. Essential hypertension Improved readings. Continue  present management and monitor  3. Reactive depression Appears stable. Continue present management and monitor  Outpatient Encounter Medications as of 04/29/2021  Medication Sig   acetaminophen (TYLENOL) 325 MG tablet Take 1-2 tablets (325-650 mg total) by mouth every 4 (four) hours as needed for mild pain.   amLODipine (NORVASC) 5 MG tablet Take 0.5 tablets (2.5 mg total) by mouth in the morning and at bedtime.   aspirin 81 MG chewable tablet Chew 1 tablet (81 mg total) by mouth daily.   carvedilol (COREG) 3.125 MG tablet TAKE 1 TABLET TWICE DAILY WITH MEALS   gabapentin (NEURONTIN) 100 MG capsule Take 1 capsule (100 mg total) by mouth 2 (two) times daily.   mirtazapine (REMERON) 7.5 MG tablet Take 1 tablet (7.5 mg total) by mouth at bedtime.   nitrofurantoin, macrocrystal-monohydrate, (MACROBID) 100 MG capsule Take 100 mg by mouth 2 (two) times daily.   pantoprazole sodium (PROTONIX) 40 mg/20 mL PACK Dissolve 1 packet in 20 mLs (40 mg total) and take by mouth daily.   polyethylene glycol (MIRALAX / GLYCOLAX) 17 g packet Take 17 g by mouth daily as needed for moderate constipation.   QUEtiapine (SEROQUEL) 50 MG tablet TAKE 1 TABLET BY MOUTH AT BEDTIME. AND 1/2 TABLET DURING THE DAY AS NEEDED FOR AGITATION   rosuvastatin (CRESTOR) 20 MG tablet Take 1 tablet (20 mg total) by mouth daily.   Zinc Sulfate 220 (50 Zn) MG TABS Take 1 tablet (220 mg total) by mouth daily.   [DISCONTINUED] tamsulosin (FLOMAX) 0.4 MG CAPS capsule Take 2 capsules (0.8 mg total) by mouth daily after supper.   ferrous sulfate 325 (65 FE) MG tablet Take 1 tablet (325 mg total) by mouth daily.   tamsulosin (FLOMAX) 0.4 MG CAPS capsule Take 2 capsules (0.8 mg total) by mouth daily after supper.   No facility-administered encounter medications on file as of 04/29/2021.    Follow-up: Return in about 3 months (around 07/28/2021).   Becky Sax, MD

## 2021-04-30 ENCOUNTER — Other Ambulatory Visit: Payer: Self-pay | Admitting: Physician Assistant

## 2021-04-30 ENCOUNTER — Encounter: Payer: Self-pay | Admitting: Family Medicine

## 2021-05-02 ENCOUNTER — Other Ambulatory Visit: Payer: Self-pay | Admitting: Physical Medicine & Rehabilitation

## 2021-05-02 ENCOUNTER — Other Ambulatory Visit: Payer: Self-pay | Admitting: Internal Medicine

## 2021-05-13 ENCOUNTER — Ambulatory Visit: Payer: Medicaid Other

## 2021-05-13 ENCOUNTER — Ambulatory Visit: Payer: Medicaid Other | Attending: Physician Assistant | Admitting: Speech Pathology

## 2021-05-13 ENCOUNTER — Encounter: Payer: Self-pay | Admitting: Speech Pathology

## 2021-05-13 ENCOUNTER — Other Ambulatory Visit: Payer: Self-pay

## 2021-05-13 VITALS — BP 128/86 | HR 94

## 2021-05-13 DIAGNOSIS — M6281 Muscle weakness (generalized): Secondary | ICD-10-CM

## 2021-05-13 DIAGNOSIS — R2681 Unsteadiness on feet: Secondary | ICD-10-CM

## 2021-05-13 DIAGNOSIS — R2689 Other abnormalities of gait and mobility: Secondary | ICD-10-CM | POA: Diagnosis present

## 2021-05-13 DIAGNOSIS — R4701 Aphasia: Secondary | ICD-10-CM | POA: Insufficient documentation

## 2021-05-13 DIAGNOSIS — R482 Apraxia: Secondary | ICD-10-CM | POA: Diagnosis present

## 2021-05-13 NOTE — Therapy (Addendum)
[facility] [address] [place], [place], [address] Phone: [phone]   Fax:  [phone]  Physical Therapy Evaluation  Patient Details  Name: Jesse [name] MRN: [id number] Date of Birth: 02/19/1976 Referring Provider (PT): Georganna Skeans   Encounter Date: 05/13/2021   PT End of Session - 05/13/21 1025     Visit Number 1    Number of Visits 9    Date for PT Re-Evaluation 07/10/21    Authorization Type Medicaid [employer]    PT Start Time 1024    PT Stop Time 1105    PT Time Calculation (min) 41 min    Equipment Utilized During Treatment Gait belt    Activity Tolerance Patient tolerated treatment well             Past Medical History:  Diagnosis Date   Anxiety    Depression    Essential hypertension    Myocardial infarction (HCC)    S/P aortic dissection repair 11/05/2020   Straight graft replacement of ascending aorta and proximal transverse aortic arch with re-suspension of native aortic valve and open hemi arch distal anastomosis with aorta to right common carotid bypass and aorta to right subclavian bypass   Stroke [facility])     Past Surgical History:  Procedure Laterality Date   ASCENDING AORTIC ROOT REPLACEMENT N/A 11/04/2020   Procedure: 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 WITH REPLACEMENT OF ASCENDING AORTA AND HEMIARCH USING HEMASHIELD PLATINUM GRAFT AND HEMASHIELD GOLD 14 X GRAFT, RESUSPENSION OF NATIVE VALVE, AORTA TO RIGHT CAROTID BYPASS, AORTA TO RIGHT SUBCLAVIAN BYPASS;  Surgeon: Purcell Nails, MD;  Location[facility];  Service: Open Heart Surgery;  Laterality: N/A;   TEE WITHOUT CARDIOVERSION N/A 11/04/2020   Procedure: TRANSESOPHAGEAL ECHOCARDIOGRAM (TEE);  Surgeon: Purcell Nails, MD;  [facility];  Service: Open Heart Surgery;  Laterality: N/A;    Vitals:   05/13/21 1028  BP: 128/86  Pulse: 94  SpO2: 99%      Subjective Assessment - 05/13/21 1028      Subjective Pt returns to PT wanting to focus more on balance. He and wife feel that balance still needs work. His change in vision also affects his balance. Pt has not had any falls but he is stumbling at times. Will also be resuming ST.    Patient is accompained by: Family member   wife, [name]   Pertinent History Pt admitted 11/04/20 with acute onset aphasia, blurry vision. CTA revealed acute aortic dissection. Pt with worsening o2 requirements requiring intubation 7/15, self extubated 7/16. 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 Transferred to CIR 7/21-7/22 and returned to acute hospital after respiratory failure with intubation x 24 hours.   PMH: Anxiety, Depression, Essential hypertension.    Patient Stated Goals Pt wants to be like I used to be and improve balance.    Currently in Pain? Yes    Pain Score 0-No pain    Pain Location Foot    Pain Orientation Left;Right    Pain Descriptors / Indicators Numbness    Pain Type Neuropathic pain    Pain Onset More than a month ago    Pain Frequency Intermittent    Aggravating Factors  when up walking more                [facility] PT Assessment - 05/13/21 1037       Assessment  Medical Diagnosis history of CVA    Referring Provider (PT) Georganna SkeansAmelia Wilson    Onset Date/Surgical Date 04/30/21    Hand Dominance Right    Prior Therapy outpatient PT      Precautions   Precautions Fall      Balance Screen   Has the patient fallen in the past 6 months No    Has the patient had a decrease in activity level because of a fear of falling?  Yes    Is the patient reluctant to leave their home because of a fear of falling?  No      Home Nurse, mental healthnvironment   Living Environment Private residence    Living Arrangements Spouse/significant other;Children    Available Help at Discharge Family    Type of Home House    Home Access Stairs to enter    Entrance  Stairs-Number of Steps 2    Entrance Stairs-Rails None    Home Layout One level    Home Equipment Walker - 2 wheels;Cane - single point;Shower seat      Prior Function   Level of Independence Independent    Vocation Full time employment    Vocation Requirements tow truck driver    Leisure fishing, hunting, sports, coaching baseball      Cognition   Overall Cognitive Status Impaired/Different from baseline    Area of Impairment --   Pt has receptive and expressive aphasia. He needed PT to repeat instructions and speak slowly.     Observation/Other Assessments   Observations peripheral vision intact. Wears glasses. Does report that he seems to have some gaps inferior/superior in vision.      Sensation   Light Touch Appears Intact    Additional Comments intact light touch in legs but does feel different overall. Right sensation feels more numb feeling than left now even though was right MCA. Pt also reports some numbness in right fingers. Feels cold a lot more now.      Coordination   Gross Motor Movements are Fluid and Coordinated Yes    Fine Motor Movements are Fluid and Coordinated Yes   finger opposition intact, RAMs intact     ROM / Strength   AROM / PROM / Strength Strength      Strength   Strength Assessment Site Shoulder;Elbow;Hand;Hip;Knee;Ankle    Right/Left Shoulder Right;Left    Right Shoulder Flexion 5/5    Left Shoulder Flexion 5/5    Right/Left Elbow Right;Left    Right Elbow Flexion 5/5    Right Elbow Extension 5/5    Left Elbow Flexion 5/5    Left Elbow Extension 5/5    Right/Left hand Right;Left    Right Hand Gross Grasp Functional    Left Hand Gross Grasp Functional    Right/Left Hip Right;Left    Right Hip Flexion 4+/5    Right Hip ABduction 4+/5    Right Hip ADduction 4+/5    Left Hip Flexion 4+/5    Left Hip ABduction 4+/5    Left Hip ADduction 4+/5    Right/Left Knee Right;Left    Right Knee Flexion 5/5    Right Knee Extension 5/5    Left Knee  Flexion 4+/5    Left Knee Extension 4+/5    Right/Left Ankle Right;Left    Right Ankle Dorsiflexion 4+/5    Left Ankle Dorsiflexion 4+/5      Transfers   Transfers Sit to Stand;Stand to Sit    Sit to Stand 6: Modified independent (Device/Increase time)  Stand to Sit 6: Modified independent (Device/Increase time)    Comments 30 sec sit to stand=9 reps without hands      Ambulation/Gait   Ambulation/Gait Yes    Ambulation/Gait Assistance 5: Supervision    Ambulation/Gait Assistance Details staggers occasionally    Ambulation Distance (Feet) 200 Feet    Assistive device None    Gait Pattern Step-through pattern    Gait velocity 10.42 sec=0.95 sec    Stairs Yes    Stairs Assistance 5: Supervision    Stair Management Technique Alternating pattern;One rail Right    Number of Stairs 4    Height of Stairs 6      High Level Balance   High Level Balance Comments SLS right=6 sec and left=1      Functional Gait  Assessment   Gait assessed  Yes    Gait Level Surface Walks 20 ft, slow speed, abnormal gait pattern, evidence for imbalance or deviates 10-15 in outside of the 12 in walkway width. Requires more than 7 sec to ambulate 20 ft.    Change in Gait Speed Able to smoothly change walking speed without loss of balance or gait deviation. Deviate no more than 6 in outside of the 12 in walkway width.    Gait with Horizontal Head Turns Performs head turns smoothly with slight change in gait velocity (eg, minor disruption to smooth gait path), deviates 6-10 in outside 12 in walkway width, or uses an assistive device.    Gait with Vertical Head Turns Performs head turns with no change in gait. Deviates no more than 6 in outside 12 in walkway width.    Gait and Pivot Turn Pivot turns safely in greater than 3 sec and stops with no loss of balance, or pivot turns safely within 3 sec and stops with mild imbalance, requires small steps to catch balance.    Step Over Obstacle Is able to step over one  shoe box (4.5 in total height) without changing gait speed. No evidence of imbalance.    Gait with Narrow Base of Support Ambulates 4-7 steps.    Gait with Eyes Closed Walks 20 ft, uses assistive device, slower speed, mild gait deviations, deviates 6-10 in outside 12 in walkway width. Ambulates 20 ft in less than 9 sec but greater than 7 sec.    Ambulating Backwards Walks 20 ft, uses assistive device, slower speed, mild gait deviations, deviates 6-10 in outside 12 in walkway width.    Steps Alternating feet, must use rail.    Total Score 20                        Objective measurements completed on examination: See above findings.                PT Education - 05/13/21 1919     Education Details PT plan of care.    Person(s) Educated Patient;Spouse    Methods Explanation    Comprehension Verbalized understanding              PT Short Term Goals - 05/13/21 1931       PT SHORT TERM GOAL #1   Title Pt will perform HEP with family supervision for improved strength, balance, and gait.    Baseline need to add to current activities    Time 4    Period Weeks    Status New    Target Date 06/10/21      PT SHORT TERM  GOAL #2   Title Pt will increase 30 sec sit to stand from 9 reps to >11 reps for improved functional strength.    Baseline 05/13/21 9 reps without hands from chair    Time 4    Period Weeks    Status New    Target Date 06/10/21      PT SHORT TERM GOAL #3   Title Pt will increase FGA from 20/30 to >22/30 for improved balance and gait safety.    Baseline 05/13/21 20/30    Time 4    Period Weeks    Status New    Target Date 06/10/21               PT Long Term Goals - 05/13/21 1934       PT LONG TERM GOAL #1   Title Pt will increase FGA from 20/30 to >24/30 for improved balance and gait safety.    Baseline 05/13/21 20/30    Time 8    Period Weeks    Status New    Target Date 07/10/21      PT LONG TERM GOAL #2   Title Pt will  increase gait speed form 0.41m/s to >1.58m/s for improved community mobility.    Baseline 05/13/21 0.64m/s    Time 8    Period Weeks    Status New    Target Date 07/10/21      PT LONG TERM GOAL #3   Title Pt will increase left SLS to 5 sec or better for improved standing balance.    Baseline 05/13/21 1 sec    Time 8    Period Weeks    Status New    Target Date 07/10/21      PT LONG TERM GOAL #4   Title Pt will ambulate >1000' on varied surfaces without AD independently scanning environment without LOB for improved community mobility.    Baseline 200' supervision    Time 8    Period Weeks    Status New    Target Date 07/10/21                    Plan - 05/13/21 1922     Clinical Impression Statement Pt is 46 y/o male with history of aortic dissection s/p repair and ischemic nonhemorrhagic right MCA and MCA/PCA watershed infarct in July of 2022. He presents with strength grossly 4+/5 to 5/5 in BLE. 30 sec sit to stand of 9 reps does indicate decreased functional strength compared to norms. He is reporting some numbness in RLE and right hand with left having improved. Pt is ambulating without AD at gait speed of 0.64m/s which is safe for community ambulator but decreased compared to normal of 1.69m/s. Pt was moderate fall risk based on Functional Gait Assessment of 20/30. Most challenged with tandem walk and gait with horizontal head tuns. Pt's SLS time is impaired especially on left only being able to hold 1 sec while he is 6 sec on right. Pt will benefit from skilled PT to address strength, balance and functional mobility deficits.    Personal Factors and Comorbidities Comorbidity 3+;Time since onset of injury/illness/exacerbation    Comorbidities Anxiety, Depression, Essential hypertension. 1    Examination-Activity Limitations Stairs;Locomotion Level;Transfers    Examination-Participation Restrictions Occupation;Community Activity;Driving;Interpersonal Relationship     Stability/Clinical Decision Making Evolving/Moderate complexity    Clinical Decision Making Moderate    Rehab Potential Good    PT Frequency 1x / week   plus eval  PT Duration 8 weeks    PT Treatment/Interventions ADLs/Self Care Home Management;DME Instruction;Gait training;Stair training;Functional mobility training;Therapeutic activities;Therapeutic exercise;Balance training;Neuromuscular re-education;Passive range of motion;Patient/family education;Orthotic Fit/Training;Vestibular    PT Next Visit Plan Monitor vitals. High level balance training- gait with horizontal head turns, tandem walking, walking on compliant surfaces, SLS activities.    Consulted and Agree with Plan of Care Patient    Family Member Consulted wife, Almira CoasterGina             Patient will benefit from skilled therapeutic intervention in order to improve the following deficits and impairments:  Abnormal gait, Decreased mobility, Decreased strength, Decreased balance, Impaired sensation  Visit Diagnosis: Other abnormalities of gait and mobility - Plan: PT plan of care cert/re-cert  Unsteadiness on feet - Plan: PT plan of care cert/re-cert  Muscle weakness (generalized) - Plan: PT plan of care cert/re-cert     Problem List Patient Active Problem List   Diagnosis Date Noted   Altered sensation due to recent stroke 02/19/2021   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   AKI (acute kidney injury) (HCC)    Benign essential HTN    Hyponatremia    Acute blood loss anemia    Urinary retention    Pressure injury of skin 11/17/2020   Respiratory arrest (HCC) 11/14/2020   Acute respiratory failure with hypoxia (HCC) 11/14/2020   Adverse effects of medication 11/14/2020   Hypotension 11/14/2020   Encounter for central line placement    Aortic dissection (HCC) 11/13/2020   Acute ischemic right MCA stroke (HCC) 11/13/2020   Agitation 11/13/2020   Atelectasis    Phlebitis    Polysubstance abuse (HCC)    Elevated troponin 11/07/2020    New onset left bundle branch block (LBBB) 11/07/2020   Protein-calorie malnutrition, severe 11/07/2020   Encephalopathy acute    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)    Acute hypoxemic respiratory failure (HCC)    Acute thoracic aortic dissection 11/05/2020   S/P aortic dissection repair 11/05/2020   Cerebral embolism with cerebral infarction 11/05/2020   Essential hypertension     Ronn MelenaEmily A Marchella Hibbard, PT, DPT, NCS 05/14/2021, 8:13 AM  [facility] [address] [place], [place], [address] Phone: [phone]   Fax:  [phone]  Name: Jesse JenkinsBradley Yasuda MRN: [id number] Date of Birth: 1975/09/24 Managed medicaid CPT codes: 97110- Therapeutic Exercise, [phone]- Neuro Re-education, [phone] - Gait Training, [phone] - Manual Therapy, [phone] - Therapeutic Activities, and [phone] - Self Care

## 2021-05-13 NOTE — Patient Instructions (Signed)
° ° °  Call [facility] 9-1-1 [phone] and let them know you have aphasia and may not be able to communicate easily in an emergency  Talk Path App on the I Pad - See all exercises and play around  Bring in notebook and tablet  Practice reading aloud   [employer] aphasia

## 2021-05-13 NOTE — Therapy (Signed)
[facility] [address] [place], [place], [address] Phone: [phone]   Fax:  [phone]  Speech Language Pathology Evaluation  Patient Details  Name: Jesse JenkinsBradley [name] MRN: [id number] Date of Birth: 07/16/1975 Referring Provider (SLP): Dr. Georganna SkeansAmelia Wilson   Encounter Date: 05/13/2021   End of Session - 05/13/21 1219     Visit Number 1    Number of Visits 15    Date for SLP Re-Evaluation 08/05/21    Authorization Type Medicaid  PT is requesting 6 visits, I am requesting 11, so that will leave him with 10 for the remainder of the year    Authorization - Visit Number 0    Authorization - Number of Visits 11    SLP Start Time 1107    SLP Stop Time  1146    SLP Time Calculation (min) 39 min             Past Medical History:  Diagnosis Date   Anxiety    Depression    Essential hypertension    Myocardial infarction (HCC)    S/P aortic dissection repair 11/05/2020   Straight graft replacement of ascending aorta and proximal transverse aortic arch with re-suspension of native aortic valve and open hemi arch distal anastomosis with aorta to right common carotid bypass and aorta to right subclavian bypass   Stroke [facility](HCC)     Past Surgical History:  Procedure Laterality Date   ASCENDING AORTIC ROOT REPLACEMENT N/A 11/04/2020   Procedure: 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 WITH REPLACEMENT OF ASCENDING AORTA AND HEMIARCH USING HEMASHIELD PLATINUM 26MM GRAFT AND HEMASHIELD GOLD 14 X 8MM GRAFT, RESUSPENSION OF NATIVE VALVE, AORTA TO RIGHT CAROTID BYPASS, AORTA TO RIGHT SUBCLAVIAN BYPASS;  Surgeon: Purcell Nailswen, Clarence H, MD;  Location: [facility] OR;  Service: Open Heart Surgery;  Laterality: N/A;   TEE WITHOUT CARDIOVERSION N/A 11/04/2020   Procedure: TRANSESOPHAGEAL ECHOCARDIOGRAM (TEE);  Surgeon: Purcell Nailswen, Clarence H, MD;  [facility];  Service: Open Heart Surgery;  Laterality: N/A;    There were no vitals filed for this  visit.   Subjective Assessment - 05/13/21 1159     Subjective "I'm good"    Patient is accompained by: Family member   spouse Almira CoasterGina   Currently in Pain? No/denies    Pain Score --   not rated due to aphasia   Pain Orientation Right;Left    Pain Descriptors / Indicators Numbness    Pain Type Neuropathic pain    Pain Onset More than a month ago                SLP Evaluation [facility] - 05/13/21 1159       SLP Visit Information   SLP Received On 05/13/21    Referring Provider (SLP) Dr. Georganna SkeansAmelia Wilson    Onset Date 11-04-20    Medical Diagnosis CVA      Subjective   Patient/Family Stated Goal "Talk like I used to"      General Information   HPI Pt with admission via ED on 11-04-20 for diffiuculty speaking. MRI revealed mod large evolving acute ischemic nonhemmorhagic rt MCA and MCA/PCA watershed infarct. Pt with ST on acute and CIR. Discharged 12-11-20 and 2 HHST visits. Pt is Healthy [employer]. Known to us from prior course of ST end of 2022    Mobility Status PT eval today, walks independently      Prior Functional Status   Cognitive/Linguistic Baseline Within functional limits    Type  of Home House     Lives With Spouse;Son;Daughter    Available Support Family   his Dad   Vocation Unemployed      Cognition   Overall Cognitive Status Difficult to assess      Auditory Comprehension   Overall Auditory Comprehension Impaired    Yes/No Questions Impaired    Basic Biographical Questions 76-100% accurate    Basic Immediate Environment Questions 75-100% accurate    Complex Questions 25-49% accurate    Commands Impaired    One Step Basic Commands 50-74% accurate    Two Step Basic Commands 50-74% accurate    Conversation Simple    Other Conversation Comments Pt looked to wife to answer initial three questions asked y SLP, with a WNL speech rate. Overall moderately-severely impaired. Better in context - slowed speech rate and repeats proved helpful for pt for more simple SLP  messages.    Interfering Components Processing speed    EffectiveTechniques Extra processing time;Visual/Gestural cues;Slowed speech;Pausing      Reading Comprehension   Reading Status Impaired    Word level 76-100% accurate    Sentence Level 26-50% accurate    Paragraph Level Not tested    Interfering Components Processing time      Expression   Primary Mode of Expression Verbal      Verbal Expression   Overall Verbal Expression Impaired    Initiation Impaired    Level of Generative/Spontaneous Verbalization Phrase    Repetition Impaired    Level of Impairment Word level    Naming Impairment    Confrontation 0-24% accurate    Convergent Not tested    Divergent 25-49% accurate    Other Naming Comments Pt named dog and pencil, phonemic paraphasias hammock, escalator, wheel chair when given phonemic cues    Verbal Errors Phonemic paraphasias;Aware of errors    Pragmatics No impairment    Effective Techniques Semantic cues;Sentence completion;Phonemic cues;Written cues      Written Expression   Dominant Hand Right    Written Expression Exceptions to [facility]    Copy Ability Word   with errors   Self Formulation Ability --   name     Oral Motor/Sensory Function   Overall Oral Motor/Sensory Function Appears within functional limits for tasks assessed      Motor Speech   Overall Motor Speech Impaired    Respiration Within functional limits    Phonation Normal    Resonance Within functional limits    Intelligibility Intelligible    Motor Planning Impaired    Level of Impairment Word    Motor Speech Errors Inconsistent;Aware    Effective Techniques Slow rate;Pause      Standardized Assessments   Standardized Assessments  Other Assessment    Other Assessment Quick Aphasia Battery                             SLP Education - 05/13/21 1218     Education Details Talk Path app, Aphasia group, Aphasia ID card    Person(s) Educated Patient;Spouse    Methods  Explanation;Demonstration;Verbal cues;Handout    Comprehension Verbalized understanding;Verbal cues required;Need further instruction              SLP Short Term Goals - 05/13/21 1652       SLP SHORT TERM GOAL #2   Title Pt will ID and self correct paraphasias in structured task 8/10 with occasional min A    Baseline 50%  ID errors on eval    Time 4    Period Weeks    Status New      SLP SHORT TERM GOAL #3   Title Pt will increase utterance legnth to 5-7 words with occasional min A    Baseline 3-4 words    Time 4    Period Weeks    Status New      SLP SHORT TERM GOAL #4   Title Pt will use compensations for aphasia 75% of anomic episodes    Baseline 50%    Time 4    Period Weeks    Status New              SLP Long Term Goals - 05/13/21 1658       SLP LONG TERM GOAL #1   Title In structured task, such as VNeST, pt will generate complex sentnece with occasional min A and extended time    Baseline simple phrases only on eval    Time 12    Period Weeks    Status New      SLP LONG TERM GOAL #2   Title pt will functionally communicate in 5 minutes simple conversation using compensations in 2 sessions    Baseline 2 minutes    Time 12    Period Weeks    Status New      SLP LONG TERM GOAL #3   Title Spouse will use 2 strategies to A pt with auditory comprehension 3x    Baseline not demonstrated    Time 12    Period Weeks    Status New      SLP LONG TERM GOAL #4   Title Pt will write personal information and family names with occasional min A    Baseline his name only    Time 12    Period Weeks    Status New      SLP LONG TERM GOAL #5   Title Pt/spouse will improve score on Communication Participation Item Bank by 2 points    Baseline score of 9    Time 12    Period Weeks    Status New              Plan - 05/13/21 1219     Clinical Impression Statement Elige Radon "[name]"  Gonyer is referred for outpt ST due to CVA 01/05/21. He is know to [place] from  prior course of ST last year. He is accompanied by his spouse, [name]. [name] reports some improvement in his aphasia. 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 [name] described him as the "life of the party" before his stroke. Today he presents with moderate to severe aphasia R47.01) affecting his ability to communicate with his family, friends and interact in the community. He stays home mostly and family stays with him at home. [name] scored a 4.59 on the QAB today, an improvement from initial score of 2/14 in September 2022. In connected speech sample, over 2 minutes, [name] had 12 fillers and/or pauses due to aphasia. Phonemic paraphasias are evident on naming tasks, repetition  and in spontaneous speech (clountry/country; rudding/running, haminick/hammock, breakmist/breakfast). Nida Boatman is inconsistently (50%) aware of these errors. He and [name] report frustration frequently with communication. Nida Boatman uses compensations for aphasia 50% of the time, as judged subjectively by spouse, and the other 50% he gets frustrated and stops communicating. Reading aloud is intact to simple word level. He named 4 animals  in 1 minute, 2/4 with self corrected phonemic paraphasias (gog/dog; brig/pig)His wife filled out the Communication Participation Item bank short form with a score of 9, with all questions rated a 1 "quite a bit" of difficulty or 0, Very much difficulty. I recommend skilled ST to maximize communication for safety, independence, QOL and to reduce sposue burden.    Speech Therapy Frequency 2x / week    Duration 8 weeks   1x a week for 3 weeks, then 2x a week for 4 weeks   Treatment/Interventions Compensatory techniques;Functional tasks;Multimodal communcation approach;Cueing hierarchy;SLP instruction and feedback;Language facilitation;Cognitive reorganization;Compensatory strategies;Internal/external aids;Patient/family education    Potential to Achieve Goals Good    Potential  Considerations Ability to learn/carryover information;Financial resources    Consulted and Agree with Plan of Care Patient;Family member/caregiver    Family Member Consulted wife             Patient will benefit from skilled therapeutic intervention in order to improve the following deficits and impairments:   Aphasia  Verbal apraxia    Problem List Patient Active Problem List   Diagnosis Date Noted   Altered sensation due to recent stroke 02/19/2021   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   AKI (acute kidney injury) (HCC)    Benign essential HTN    Hyponatremia    Acute blood loss anemia    Urinary retention    Pressure injury of skin 11/17/2020   Respiratory arrest (HCC) 11/14/2020   Acute respiratory failure with hypoxia (HCC) 11/14/2020   Adverse effects of medication 11/14/2020   Hypotension 11/14/2020   Encounter for central line placement    Aortic dissection (HCC) 11/13/2020   Acute ischemic right MCA stroke (HCC) 11/13/2020   Agitation 11/13/2020   Atelectasis    Phlebitis    Polysubstance abuse (HCC)    Elevated troponin 11/07/2020   New onset left bundle branch block (LBBB) 11/07/2020   Protein-calorie malnutrition, severe 11/07/2020   Encephalopathy acute    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 [place], [place], [address] Phone: [phone]   Fax:  [phone]  Name: Kimoni Pagliarulo MRN: [id number] Date of Birth: August 14, 1975

## 2021-05-19 ENCOUNTER — Encounter: Payer: Medicaid Other | Admitting: Physical Medicine & Rehabilitation

## 2021-05-20 ENCOUNTER — Ambulatory Visit: Payer: Medicaid Other | Admitting: Adult Health

## 2021-05-20 ENCOUNTER — Telehealth: Payer: Self-pay | Admitting: Adult Health

## 2021-05-20 ENCOUNTER — Encounter: Payer: Self-pay | Admitting: Adult Health

## 2021-05-20 VITALS — BP 112/83 | HR 91 | Ht 76.0 in | Wt 148.0 lb

## 2021-05-20 DIAGNOSIS — I63131 Cerebral infarction due to embolism of right carotid artery: Secondary | ICD-10-CM

## 2021-05-20 DIAGNOSIS — I69320 Aphasia following cerebral infarction: Secondary | ICD-10-CM | POA: Diagnosis not present

## 2021-05-20 DIAGNOSIS — I69398 Other sequelae of cerebral infarction: Secondary | ICD-10-CM | POA: Diagnosis not present

## 2021-05-20 DIAGNOSIS — H547 Unspecified visual loss: Secondary | ICD-10-CM | POA: Diagnosis not present

## 2021-05-20 NOTE — Patient Instructions (Signed)
Continue aspirin 81 mg daily  and Crestor 20mg  daily  for secondary stroke prevention  You will be called to schedule evaluation with [facility]  Continue working with PT and speech therapy - keep up the good work!!   Continue to follow up with PCP regarding cholesterol and blood pressure management  Maintain strict control of hypertension with blood pressure goal below 130/90 and cholesterol with LDL cholesterol (bad cholesterol) goal below 70 mg/dL.   Signs of a Stroke? Follow the BEFAST method:  Balance Watch for a sudden loss of balance, trouble with coordination or vertigo Eyes Is there a sudden loss of vision in one or both eyes? Or double vision?  Face: Ask the person to smile. Does one side of the face droop or is it numb?  Arms: Ask the person to raise both arms. Does one arm drift downward? Is there weakness or numbness of a leg? Speech: Ask the person to repeat a simple phrase. Does the speech sound slurred/strange? Is the person confused ? Time: If you observe any of these signs, call 911.     Followup in the future with me in 5 months or call earlier if needed       Thank you for coming to see [facility] at [facility]. I hope we have been able to provide you high quality care today.  You may receive a patient satisfaction survey over the next few weeks. We would appreciate your feedback and comments so that we may continue to improve ourselves and the health of our patients.

## 2021-05-20 NOTE — Telephone Encounter (Signed)
Referral has been sent to [facility].

## 2021-05-20 NOTE — Progress Notes (Signed)
[facility] [address] [place]. [place] [address] [phone]       STROKE FOLLOW UP NOTE  [name] Date of Birth:  10/25/1975 Medical Record Number:  [id number]   Reason for Referral: stroke follow up    SUBJECTIVE:   CHIEF COMPLAINT:  Chief Complaint  Patient presents with   Follow-up    Rm 3 with spouse Barnett Applebaum Pt is well, spouse states he is improving. Still having some speech difficulty, still seeing SLP. Also still having some imbalance and vision impairment. No new concerns     HPI:  Update 05/20/2021 JM: Returns for 44-month stroke follow-up accompanied by his wife. Overall doing well without new stroke/TIA symptoms. Residual aphasia improving, recently restarted SLP.  Residual gait impairment with imbalance gradually improving, recently restarted PT.  Denies any recent falls. Visual impairment persistent since prior visit - reports limited vision upper and lower quadrants bilaterally.  Occasional double vision.  Paresthesias continued dysesthesias left leg and right hand - can be worse after prolonged ambulation or standing - occasionally painful - placed on gabapentin 100mg  BID - only taking 1 cap at night due to fatigue on BID dosing - believes some improvement. Compliant on aspirin and Crestor without side effects.  Blood pressure today 112/83.  Routinely monitors at home and typically stable.  Routinely follows with PCP and cardiology. Eval by genetics counselor 01/2021 currently awaiting genetic testing to assess for connective tissue disorder.  Reports complete tobacco cessation since prior visit.  No further concerns at this time   History provided for reference purposes only Initial visit 02/03/2021 JM: Patient is being seen for hospital follow-up accompanied by his wife who provides majority of history  Reports residual:  Expressive aphasia -has been gradually improving.  Currently working with SLP. Per wife, able to participate in more  conversations Denies residual receptive aphasia  Gait impairment with imbalance -gradually improving.  Currently working with PT Pins/needles right hand and left foot - painful and sensitive. Wife questioning if this is interfering with imbalance Vision impairment - continued difficulty seeing the ground or reading. Will have to tilt his head to help compensate.   Worsening depression/anxiety: started on mirtazapine by PCP but not yet started (due to concern of interaction with Seroquel).  Remains on Seroquel 25 mg nightly and 25 mg as needed (but has not recently needed) Reports good appetite and no swallowing difficulties  Previously working as a tow Administrator and was not covered for short-term or long-term disability.  Wife is in the process of applying for Social Security disability.  He has since been approved for Medicaid.  Compliant on aspirin and Crestor without side effects.  Blood pressure today 106/64.  Routinely monitors at home per wife, has been on the lower side especially after medications.  No further concerns at this time   Stroke admission 11/04/2020 [name] is a 46 y.o. male with history of anxiety and longstanding tobacco and alcohol use, not seeing doctor for 22 years admitted on 11/04/2020 for chest pain, confusion, headache, blurry vision and aphasia.  Initial CT head unremarkable.  CTA found to have aortic dissection requiring emergent repair by Dr. Roxy Manns.  Complicated hospital course with CT head showed relatively large infarct with cytotoxic edema in posterior right MCA and MCA/PCA watershed territories with development of petechial hemorrhage. MRI large right MCA infarct with mild cytotoxic edema and rightward midline shift with tiny punctate bilateral cerebral and cerebellar embolic infarcts.  MRA head/neck interval revascularization  of right ICA and MCA and right aorto to subclavian bypass with patent right carotid artery.  EF 55 to 60%.  On 7/15, patient  increasing O2 requirements and reintubated with postop course additionally complicated by acute blood loss anemia, coagulopathy, left antecubital superficial venous thrombosis (started on Keflex, Vanco and heparin drip) and persistent hyperactive delirium requiring Precedex and eventually transition to Klonopin, Seroquel, valproic acid and Ativan as needed.  EEG 7/16 focal right hemispheric slowing and mild generalized slowing but no definite epileptiform activity.  LDL 49.  A1c 5.2.  Started on aspirin 81 mg daily per CTS. On 7/20, rapid response called for anisicoria.  Repeat CT head 7/20 interval development of petechial hemorrhage associated with evolving posterior right MCA and MCA/PCA watershed territory infarct with cytotoxic edema and mass-effect mildly progressed with approximately 4 mm of leftward midline shift at the foramen of Monro (previously 3 mm when remeasured).   Eventually transferred to CIR on 7/21 as mobility declining any more scheduled rehab routinely improved delirium.  On 7/23, patient found to be apneic with pulse and blood pressure 72/54 and unresponsive and code initiated requiring reintubation likely due to aspiration pneumonia as well as COVID-positive state.  Extubated 7/23 and repeat CT head showed evolving right MCA territory infarct with decreasing edema.  Poor p.o. intake therefore core track placed.  Continue to have issues with poor attention, global aphasia with cognitive deficits and unsteady gait and was transferred back to CIR on 8/4.  During CIR admission, mood stable and weaned off Seroquel, renal status improved, urinary retention requiring Foley placement, improvement of p.o. intake with tube feeds discontinued 8/6 on dysphagia 2 diet and completed course of antibiotics for COVID-pneumonia.  Is eventually discharged home on 12/11/2020 with home health therapies.          PERTINENT IMAGING  CT HEAD 11/04/2020 IMPRESSION: No acute intracranial hemorrhage.  Question subtle small loss of gray-white differentiation in the right insular region (ASPECT score is 9).  11/06/2020 IMPRESSION: 1. Relatively large infarct with cytotoxic edema in the posterior Right MCA and MCA/PCA watershed territories. Mild mass effect including trace leftward midline shift. No hemorrhagic transformation. 2. Preserved gray-white matter differentiation elsewhere.  11/12/2020 IMPRESSION: In comparison to CT head from 11/06/2020, interval development of petechial hemorrhage associated with the evolving posterior right MCA and MCA/PCA watershed territory infarct. No mass occupying hemorrhagic transformation. Cytotoxic edema and mass effect is mildly progressed with approximately 4 mm of leftward midline shift at the foramen of Monro (previously 3 mm when remeasured).  11/14/2020 IMPRESSION: Acute infarct in the posterior right MCA territory. There is progressive gyral intermediate density throughout the infarct with significant change from 2 days ago. It is unclear if this is evolution of infarct with laminar necrosis versus petechial hemorrhage related to hemorrhagic transformation. Differentiation of these 2 findings could be best performed with MRI.  11/18/2020 IMPRESSION: Evolving posterior right MCA territory infarction with decreased edema and mild mass effect. No new hemorrhage.   New acute or subacute small bilateral cerebellar infarcts. Some may have been subtly present on the prior CT.   CT ANGIO  IMPRESSION: Partially imaged type A aortic dissection involving the ascending aorta and transverse portion but not the descending aorta.   Dissection involves innominate artery with thrombosis of false lumen. This extends into the right common carotid with narrowing of the true lumen to a minimal diameter of 1.5 mm. Minimal enhancement within the right cervical and intracranial ICA.   Reconstitution at the right ICA  terminus. No proximal  intracranial vessel occlusion. 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   MR BRAIN MRA HEAD/NECK IMPRESSION: MRI HEAD IMPRESSION: 1. 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 Associated regional mass effect with 5 mm of right-to-left shift, slightly increased from prior. 2. Additional scattered subcentimeter acute ischemic infarcts involving the bilateral cerebral hemispheres and right cerebellum, embolic in nature. No associated hemorrhage or mass effect. 3. No other acute intracranial abnormality.   MRA HEAD IMPRESSION: 1. Interval re-establishment of flow within the right ICA, now patent to the terminus. Right MCA well perfused, with no visible persistent downstream occlusion evident by MRA. 2. Otherwise stable and unremarkable intracranial MRA.   MRA NECK IMPRESSION: 1. 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 2. Right carotid artery system supplied via the right subclavian bypass, and is now widely patent without residual dissection or other abnormality. 3. Left carotid artery system supplied via the native aortic arch, and remains widely patent without stenosis or other abnormality. 4. 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 Follow-up examination with dedicated CTA could be performed for further evaluation of this finding as warranted. 5. Both vertebral arteries remain widely patent within the neck. Right vertebral artery dominant.      ROS:   14 system review of systems performed and negative with exception of those listed in HPI  PMH:  Past Medical History:  Diagnosis Date   Anxiety    Depression     Essential hypertension    Myocardial infarction (HCC)    S/P aortic dissection repair 11/05/2020   Straight graft replacement of ascending aorta and proximal transverse aortic arch with re-suspension of native aortic valve and open hemi arch distal anastomosis with aorta to right common carotid bypass and aorta to right subclavian bypass   Stroke (HCC)     PSH:  Past Surgical History:  Procedure Laterality Date   ASCENDING AORTIC ROOT REPLACEMENT N/A 11/04/2020   Procedure: 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 WITH REPLACEMENT OF ASCENDING AORTA AND HEMIARCH USING HEMASHIELD PLATINUM GRAFT AND HEMASHIELD GOLD 14 X GRAFT, RESUSPENSION OF NATIVE VALVE, AORTA TO RIGHT CAROTID BYPASS, AORTA TO RIGHT SUBCLAVIAN BYPASS;  Surgeon: Purcell Nails, MD;  Location: [facility] OR;  Service: Open Heart Surgery;  Laterality: N/A;   TEE WITHOUT CARDIOVERSION N/A 11/04/2020   Procedure: TRANSESOPHAGEAL ECHOCARDIOGRAM (TEE);  Surgeon: Purcell Nails, MD;  Location: [facility] OR;  Service: Open Heart Surgery;  Laterality: N/A;    Social History:  Social History   Socioeconomic History   Marital status: Married    Spouse name: Not on file   Number of children: Not on file   Years of education: Not on file   Highest education level: 9th grade  Occupational History   Not on file  Tobacco Use   Smoking status: Former    Types: Cigarettes    Quit date: 11/04/2020    Years since quitting: 0.5   Smokeless tobacco: Never  Vaping Use   Vaping Use: Never used  Substance and Sexual Activity   Alcohol use: Yes    Alcohol/week: 2.0 standard drinks    Types: 2 Cans of beer per week   Drug use: Not Currently   Sexual  activity: Yes  Other Topics Concern   Not on file  Social History Narrative   Not on file   Social Determinants of Health   Financial Resource Strain: Not on file  Food Insecurity: Not on file  Transportation Needs: Not on file  Physical Activity: Not on file  Stress: Not on file   Social Connections: Not on file  Intimate Partner Violence: Not on file    Family History: History reviewed. No pertinent family history.  Medications:   Current Outpatient Medications on File Prior to Visit  Medication Sig Dispense Refill   acetaminophen (TYLENOL) 325 MG tablet Take 1-2 tablets (325-650 mg total) by mouth every 4 (four) hours as needed for mild pain.     amLODipine (NORVASC) 5 MG tablet Take 0.5 tablets (2.5 mg total) by mouth in the morning and at bedtime. 90 tablet 3   aspirin 81 MG chewable tablet Chew 1 tablet (81 mg total) by mouth daily.     carvedilol (COREG) 3.125 MG tablet TAKE 1 TABLET BY MOUTH TWICE A DAY WITH MEALS 60 tablet 3   ferrous sulfate 325 (65 FE) MG tablet Take 1 tablet (325 mg total) by mouth daily. 30 tablet 3   gabapentin (NEURONTIN) 100 MG capsule Take 1 capsule (100 mg total) by mouth 2 (two) times daily. 60 capsule 2   mirtazapine (REMERON) 7.5 MG tablet Take 1 tablet (7.5 mg total) by mouth at bedtime. 30 tablet 0   pantoprazole sodium (PROTONIX) 40 mg/20 mL PACK Dissolve 1 packet in 20 mLs (40 mg total) and take by mouth daily. 30 mL 0   polyethylene glycol (MIRALAX / GLYCOLAX) 17 g packet Take 17 g by mouth daily as needed for moderate constipation. 14 each 0   QUEtiapine (SEROQUEL) 50 MG tablet TAKE 1 TABLET BY MOUTH AT BEDTIME. AND 1/2 TABLET DURING THE DAY AS NEEDED FOR AGITATION 135 tablet 1   rosuvastatin (CRESTOR) 20 MG tablet Take 1 tablet (20 mg total) by mouth daily. 30 tablet 11   tamsulosin (FLOMAX) 0.4 MG CAPS capsule Take 2 capsules (0.8 mg total) by mouth daily after supper. 90 capsule 1   Zinc Sulfate 220 (50 Zn) MG TABS Take 1 tablet (220 mg total) by mouth daily. 90 tablet 0   No current facility-administered medications on file prior to visit.    Allergies:  No Known Allergies    OBJECTIVE:  Physical Exam  Vitals:   05/20/21 1002  BP: 112/83  Pulse: 91  Weight: 148 lb (67.1 kg)  Height: 6\' 4"  (1.93 m)     Body mass index is 18.02 kg/m. No results found.   General: Frail very pleasant middle-age Caucasian male, seated, in no evident distress Head: head normocephalic and atraumatic.   Neck: supple with no carotid or supraclavicular bruits Cardiovascular: regular rate and rhythm, no murmurs Musculoskeletal: no deformity Skin:  no rash/petichiae Vascular:  Normal pulses all extremities   Neurologic Exam Mental Status: Awake and fully alert. Moderate expressive aphasia. Able to speak in short sentences.  Follows commands without difficulty.  Oriented to place and time. Recent memory mildly impaired and remote memory intact. Attention span, concentration and fund of knowledge appropriate. Mood and affect appropriate.  Cranial Nerves: Pupils equal, briskly reactive to light. Extraocular movements full without nystagmus. OD turns in with vertical movements and turns out with convergence. Visual fields lower visual field impairment bilaterally. Hearing intact. Facial sensation intact. Face, tongue, palate moves normally and symmetrically.  Motor: Normal bulk and  tone. Normal strength in all tested extremity muscles Sensory.: intact to touch , pinprick , position and vibratory sensation.  Subjective right hand and left foot pins/needle Coordination: Rapid alternating movements normal in all extremities. Finger-to-nose and heel-to-shin performed accurately bilaterally. Gait and Station: Arises from chair without difficulty. Stance is normal. Gait demonstrates normal stride length and mild imbalance with slight favoring of leg leg without use of assistive device.  Mild difficulty with tandem walk and heel/toe.  Reflexes: 1+ and symmetric. Toes downgoing.          ASSESSMENT: Jesse [name] is a 46 y.o. 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 Vascular  risk factors include HTN, hx of tobacco and EtOH use, and s/p aortic dissection repair.      PLAN:  Right MCA/PCA infarct:  Residual deficit: Expressive aphasia, gait impairment with imbalance, visual deficit and right hand and left foot numbness/tingling. Greatly improving since prior visit - recently restarted PT/SLP. Referral placed to Dr. Katy Fitch for visual concerns. Continue gabapentin for dysesthesias managed by PMR Continue aspirin 81 mg daily  and Crestor 20 mg daily for secondary stroke prevention.   Discussed secondary stroke prevention measures and importance of close PCP follow up for aggressive stroke risk factor management. I have gone over the pathophysiology of stroke, warning signs and symptoms, risk factors and their management in some detail with instructions to go to the closest emergency room for symptoms of concern. HTN: BP goal <130/90.  Stable on current regimen per PCP  S/p aortic dissection repair: Routinely followed by cardiology.  Released from cardiothoracic surgery.  Currently being worked up for marfanoid or connective tissue disorder - awaiting genetic testing Tobacco use: Congratulated on complete tobacco cessation and highly encouraged continued cessation    Follow up in 5 months or call earlier if needed   CC:  PCP: Dorna Mai, MD    I spent 36 minutes of face-to-face and non-face-to-face time with patient and wife.  This included previsit chart review, lab review, study review, order entry, electronic health record documentation, patient and wife education regarding prior stroke with residual deficits, secondary stroke prevention measures and importance of managing stroke risk factors and answered all other questions to patient and wife's satisfaction  Frann Rider, AGNP-BC  [facility] [address] [place], [place] [address]  Phone [phone] Fax [phone] Note: This document was prepared with digital  dictation and possible smart phrase technology. Any transcriptional errors that result from this process are unintentional.

## 2021-05-22 ENCOUNTER — Other Ambulatory Visit: Payer: Self-pay

## 2021-05-22 ENCOUNTER — Ambulatory Visit: Payer: Medicaid Other

## 2021-05-22 DIAGNOSIS — R482 Apraxia: Secondary | ICD-10-CM

## 2021-05-22 DIAGNOSIS — R4701 Aphasia: Secondary | ICD-10-CM | POA: Diagnosis not present

## 2021-05-22 NOTE — Therapy (Signed)
[facility] [address] [place], [place], [address] Phone: [phone]   Fax:  [phone]  Speech Language Pathology Treatment  Patient Details  Name: Jesse [name] MRN: [id number] Date of Birth: 1975/11/10 Referring Provider (SLP): Dr. Georganna Skeans   Encounter Date: 05/22/2021   End of Session - 05/22/21 1207     Visit Number 2    Number of Visits 15    Date for SLP Re-Evaluation 08/05/21    Authorization Type Medicaid  PT is requesting 6 visits, I am requesting 11, so that will leave him with 10 for the remainder of the year    Authorization - Visit Number 1    Authorization - Number of Visits 11    SLP Start Time 1016    SLP Stop Time  1103    SLP Time Calculation (min) 47 min    Activity Tolerance Patient tolerated treatment well             Past Medical History:  Diagnosis Date   Anxiety    Depression    Essential hypertension    Myocardial infarction (HCC)    S/P aortic dissection repair 11/05/2020   Straight graft replacement of ascending aorta and proximal transverse aortic arch with re-suspension of native aortic valve and open hemi arch distal anastomosis with aorta to right common carotid bypass and aorta to right subclavian bypass   Stroke [facility])     Past Surgical History:  Procedure Laterality Date   ASCENDING AORTIC ROOT REPLACEMENT N/A 11/04/2020   Procedure: 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 WITH REPLACEMENT OF ASCENDING AORTA AND HEMIARCH USING HEMASHIELD PLATINUM GRAFT AND HEMASHIELD GOLD 14 X GRAFT, RESUSPENSION OF NATIVE VALVE, AORTA TO RIGHT CAROTID BYPASS, AORTA TO RIGHT SUBCLAVIAN BYPASS;  Surgeon: Purcell Nails, MD;  Location: [facility] OR;  Service: Open Heart Surgery;  Laterality: N/A;   TEE WITHOUT CARDIOVERSION N/A 11/04/2020   Procedure: TRANSESOPHAGEAL ECHOCARDIOGRAM (TEE);  Surgeon: Purcell Nails, MD;  Location: [facility] OR;  Service: Open Heart Surgery;   Laterality: N/A;    There were no vitals filed for this visit.   Subjective Assessment - 05/22/21 1018     Subjective "just trying to talk"    Patient is accompained by: Family member   wife   Currently in Pain? Yes    Pain Score --   unable to rate due to aphasia   Pain Location --   foot                  ADULT SLP TREATMENT - 05/22/21 1019       General Information   Behavior/Cognition Alert;Cooperative;Pleasant mood;Requires cueing      Treatment Provided   Treatment provided Cognitive-Linquistic      Cognitive-Linquistic Treatment   Treatment focused on Aphasia;Apraxia    Skilled Treatment Pt's wife reported increased confidence and improved verbal expression over last several days. Pt able to communicate more functionally compared to last session with this therapist. SLP introduced VNeST today, in which pt able to identify who/what with occasional min to mod A due to comprehension. Pt able to generate sentences with occcasional cues for initiation. Pt able to generate responses for where/why/when with occasional min A for naming errors. Pt exhibited increased self-corrections. Reading at sentence level benefited from occasional cues to correct paraphasias. Usual A required for writing even when copying.      Assessment / Recommendations / Plan   Plan Continue with current  plan of care      Progression Toward Goals   Progression toward goals Progressing toward goals              SLP Education - 05/22/21 1049     Education Details VNeST    Person(s) Educated Patient;Spouse    Methods Explanation;Demonstration;Verbal cues;Handout    Comprehension Verbalized understanding;Returned demonstration;Need further instruction              SLP Short Term Goals - 05/22/21 1051       SLP SHORT TERM GOAL #2   Title Pt will ID and self correct paraphasias in structured task 8/10 with occasional min A    Baseline 50% ID errors on eval    Time 4    Period Weeks     Status On-going      SLP SHORT TERM GOAL #3   Title Pt will increase utterance legnth to 5-7 words with occasional min A    Baseline 3-4 words    Time 4    Period Weeks    Status On-going      SLP SHORT TERM GOAL #4   Title Pt will use compensations for aphasia 75% of anomic episodes    Baseline 50%    Time 4    Period Weeks    Status On-going              SLP Long Term Goals - 05/22/21 1209       SLP LONG TERM GOAL #1   Title In structured task, such as VNeST, pt will generate complex sentnece with occasional min A and extended time    Baseline simple phrases only on eval    Time 12    Period Weeks    Status On-going      SLP LONG TERM GOAL #2   Title pt will functionally communicate in 5 minutes simple conversation using compensations in 2 sessions    Baseline 2 minutes    Time 12    Period Weeks    Status On-going      SLP LONG TERM GOAL #3   Title Spouse will use 2 strategies to A pt with auditory comprehension 3x    Baseline not demonstrated    Time 12    Period Weeks    Status On-going      SLP LONG TERM GOAL #4   Title Pt will write personal information and family names with occasional min A    Baseline his name only    Time 12    Period Weeks    Status On-going      SLP LONG TERM GOAL #5   Title Pt/spouse will improve score on Communication Participation Item Bank by 2 points    Baseline score of 9    Time 12    Period Weeks    Status On-going              Plan - 05/22/21 1207     Clinical Impression Statement Jesse RadonBradley "Jesse [name]"  Jesse [name] is referred for outpt ST due to CVA 01/05/21. Initiated training for VNeST today due to pt exhibiting increasing MLU independently compared to prior baseline in therapy. Pt benefited from occasional min to mod A to aid comprehension, naming, and correction of apraxic errors. I recommend skilled ST to maximize communication for safety, independence, QOL and to reduce sposue burden.    Speech Therapy Frequency  2x / week    Duration 8 weeks   1x a  week for 3 weeks, then 2x a week for 4 weeks   Treatment/Interventions Compensatory techniques;Functional tasks;Multimodal communcation approach;Cueing hierarchy;SLP instruction and feedback;Language facilitation;Cognitive reorganization;Compensatory strategies;Internal/external aids;Patient/family education    Potential to Achieve Goals Good    Potential Considerations Ability to learn/carryover information;Financial resources    Consulted and Agree with Plan of Care Patient;Family member/caregiver    Family Member Consulted wife             Patient will benefit from skilled therapeutic intervention in order to improve the following deficits and impairments:   Aphasia  Verbal apraxia    Problem List Patient Active Problem List   Diagnosis Date Noted   Altered sensation due to recent stroke 02/19/2021   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   AKI (acute kidney injury) (HCC)    Benign essential HTN    Hyponatremia    Acute blood loss anemia    Urinary retention    Pressure injury of skin 11/17/2020   Respiratory arrest (HCC) 11/14/2020   Acute respiratory failure with hypoxia (HCC) 11/14/2020   Adverse effects of medication 11/14/2020   Hypotension 11/14/2020   Encounter for central line placement    Aortic dissection (HCC) 11/13/2020   Acute ischemic right MCA stroke (HCC) 11/13/2020   Agitation 11/13/2020   Atelectasis    Phlebitis    Polysubstance abuse (HCC)    Elevated troponin 11/07/2020   New onset left bundle branch block (LBBB) 11/07/2020   Protein-calorie malnutrition, severe 11/07/2020   Encephalopathy acute    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

## 2021-05-27 ENCOUNTER — Ambulatory Visit: Payer: Medicaid Other | Admitting: Rehabilitation

## 2021-05-27 ENCOUNTER — Ambulatory Visit: Payer: Medicaid Other | Admitting: Speech Pathology

## 2021-05-29 ENCOUNTER — Other Ambulatory Visit: Payer: Self-pay | Admitting: Family Medicine

## 2021-05-31 ENCOUNTER — Other Ambulatory Visit: Payer: Self-pay | Admitting: Physical Medicine & Rehabilitation

## 2021-06-01 ENCOUNTER — Other Ambulatory Visit: Payer: Self-pay | Admitting: Family Medicine

## 2021-06-03 ENCOUNTER — Encounter: Payer: Self-pay | Admitting: Speech Pathology

## 2021-06-03 ENCOUNTER — Ambulatory Visit: Payer: Medicaid Other | Attending: Physician Assistant | Admitting: Speech Pathology

## 2021-06-03 ENCOUNTER — Ambulatory Visit: Payer: Medicaid Other | Admitting: Rehabilitation

## 2021-06-03 ENCOUNTER — Other Ambulatory Visit: Payer: Self-pay

## 2021-06-03 ENCOUNTER — Other Ambulatory Visit: Payer: Self-pay | Admitting: Family Medicine

## 2021-06-03 ENCOUNTER — Encounter: Payer: Self-pay | Admitting: Rehabilitation

## 2021-06-03 VITALS — BP 133/104

## 2021-06-03 DIAGNOSIS — M6281 Muscle weakness (generalized): Secondary | ICD-10-CM | POA: Insufficient documentation

## 2021-06-03 DIAGNOSIS — R482 Apraxia: Secondary | ICD-10-CM | POA: Diagnosis present

## 2021-06-03 DIAGNOSIS — R4701 Aphasia: Secondary | ICD-10-CM | POA: Diagnosis not present

## 2021-06-03 DIAGNOSIS — R2681 Unsteadiness on feet: Secondary | ICD-10-CM | POA: Diagnosis present

## 2021-06-03 DIAGNOSIS — I69354 Hemiplegia and hemiparesis following cerebral infarction affecting left non-dominant side: Secondary | ICD-10-CM | POA: Diagnosis present

## 2021-06-03 DIAGNOSIS — R2689 Other abnormalities of gait and mobility: Secondary | ICD-10-CM | POA: Diagnosis present

## 2021-06-03 MED ORDER — MIRTAZAPINE 7.5 MG PO TABS: 7.5000 mg | ORAL_TABLET | Freq: Every day | ORAL | 0 refills | Status: DC

## 2021-06-03 NOTE — Therapy (Signed)
[facility] [address] [place], [place], [address] Phone: [phone]   Fax:  [phone]  Speech Language Pathology Treatment  Patient Details  Name: Jesse [name] MRN: [id number] Date of Birth: 05/01/1975 Referring Provider (SLP): Dr. Georganna Skeans   Encounter Date: 06/03/2021   End of Session - 06/03/21 1210     Visit Number 3    Number of Visits 15    Date for SLP Re-Evaluation 08/05/21    Authorization Type Medicaid  PT is requesting 6 visits, I am requesting 11, so that will leave him with 10 for the remainder of the year    Authorization - Visit Number 2    Authorization - Number of Visits 11    SLP Start Time 1015    SLP Stop Time  1100    SLP Time Calculation (min) 45 min    Activity Tolerance Patient tolerated treatment well             Past Medical History:  Diagnosis Date   Anxiety    Depression    Essential hypertension    Myocardial infarction (HCC)    S/P aortic dissection repair 11/05/2020   Straight graft replacement of ascending aorta and proximal transverse aortic arch with re-suspension of native aortic valve and open hemi arch distal anastomosis with aorta to right common carotid bypass and aorta to right subclavian bypass   Stroke [facility])     Past Surgical History:  Procedure Laterality Date   ASCENDING AORTIC ROOT REPLACEMENT N/A 11/04/2020   Procedure: 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 WITH REPLACEMENT OF ASCENDING AORTA AND HEMIARCH USING HEMASHIELD PLATINUM GRAFT AND HEMASHIELD GOLD 14 X GRAFT, RESUSPENSION OF NATIVE VALVE, AORTA TO RIGHT CAROTID BYPASS, AORTA TO RIGHT SUBCLAVIAN BYPASS;  Surgeon: Purcell Nails, MD;  Location: [facility] OR;  Service: Open Heart Surgery;  Laterality: N/A;   TEE WITHOUT CARDIOVERSION N/A 11/04/2020   Procedure: TRANSESOPHAGEAL ECHOCARDIOGRAM (TEE);  Surgeon: Purcell Nails, MD;  Location: [facility] OR;  Service: Open Heart Surgery;   Laterality: N/A;    There were no vitals filed for this visit.   Subjective Assessment - 06/03/21 1020     Subjective "I don't think he feels good today"    Currently in Pain? No/denies                   ADULT SLP TREATMENT - 06/03/21 1020       General Information   Behavior/Cognition Alert;Cooperative;Pleasant mood;Requires cueing      Treatment Provided   Treatment provided Cognitive-Linquistic      Cognitive-Linquistic Treatment   Treatment focused on Aphasia;Apraxia;Patient/family/caregiver education    Skilled Treatment Jesse [name] reports improved speech and language.   Targeted word finding, verbal apraxia, sentence generation with Scientist, product/process development (VNeST). For the verbs push, deliver, wash, the patient generated 3 subject and objects for each verb, a total of 9 subjects and objects with frequent mod verbal, questioning, written cues required. Pt generated 3 complex sentences, 1 for each verb, by answering "wh" questions with usual mod cues, for a total of 3 complex sentences. Extended time required cosistently Written expression targeted a simple word level (4-5 letters) with simple sentence completion task - Jesse [name] required consistent max A (fill in cues, choice of 2 letters for fill in) to write 4 short words. Trained spouse on using fill in cues and choice of 2, a correct spelling and and error spelling and have Jesse [name] choose  with HW as needed. They have been practicing writng his name and numbers. Instructed them to practice writing in a language based task - provided this for Jesse [name].      Assessment / Recommendations / Plan   Plan Continue with current plan of care      Progression Toward Goals   Progression toward goals Progressing toward goals              SLP Education - 06/03/21 1207     Education Details use language based activities for written expression    Person(s) Educated Patient;Spouse    Methods Explanation;Demonstration;Verbal  cues;Handout    Comprehension Verbalized understanding;Returned demonstration;Verbal cues required;Need further instruction              SLP Short Term Goals - 06/03/21 1209       SLP SHORT TERM GOAL #2   Title Pt will ID and self correct paraphasias in structured task 8/10 with occasional min A    Baseline 50% ID errors on eval    Time 3    Period Weeks    Status On-going      SLP SHORT TERM GOAL #3   Title Pt will increase utterance legnth to 5-7 words with occasional min A    Baseline 3-4 words    Time 3    Period Weeks    Status On-going      SLP SHORT TERM GOAL #4   Title Pt will use compensations for aphasia 75% of anomic episodes    Baseline 50%    Time 3    Period Weeks    Status On-going              SLP Long Term Goals - 06/03/21 1209       SLP LONG TERM GOAL #1   Title In structured task, such as VNeST, pt will generate complex sentnece with occasional min A and extended time    Baseline simple phrases only on eval    Time 11    Period Weeks    Status On-going      SLP LONG TERM GOAL #2   Title pt will functionally communicate in 5 minutes simple conversation using compensations in 2 sessions    Baseline 2 minutes    Time 11    Period Weeks    Status On-going      SLP LONG TERM GOAL #3   Title Spouse will use 2 strategies to A pt with auditory comprehension 3x    Baseline not demonstrated    Time 11    Period Weeks    Status On-going      SLP LONG TERM GOAL #4   Title Pt will write personal information and family names with occasional min A    Baseline his name only    Time 11    Period Weeks    Status On-going      SLP LONG TERM GOAL #5   Title Pt/spouse will improve score on Communication Participation Item Bank by 2 points    Baseline score of 9    Time 11    Period Weeks    Status On-going              Plan - 06/03/21 1208     Clinical Impression Statement Jesse [name] is referred for outpt ST due to CVA  01/05/21. Continued training for VNeST today due to pt exhibiting increasing MLU independently compared to prior baseline in therapy.  Pt benefited from occasional min to mod A to aid comprehension, naming, and correction of apraxic errors. I recommend skilled ST to maximize communication for safety, independence, QOL and to reduce sposue burden.    Speech Therapy Frequency 2x / week    Duration 8 weeks   1x a week for 3 weeks, then 2x a week for 4 weeks   Treatment/Interventions Compensatory techniques;Functional tasks;Multimodal communcation approach;Cueing hierarchy;SLP instruction and feedback;Language facilitation;Cognitive reorganization;Compensatory strategies;Internal/external aids;Patient/family education    Potential to Achieve Goals Good    Potential Considerations Financial resources    SLP Home Exercise Plan Talk Path             Patient will benefit from skilled therapeutic intervention in order to improve the following deficits and impairments:   Aphasia  Verbal apraxia    Problem List Patient Active Problem List   Diagnosis Date Noted   Altered sensation due to recent stroke 02/19/2021   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   AKI (acute kidney injury) (HCC)    Benign essential HTN    Hyponatremia    Acute blood loss anemia    Urinary retention    Pressure injury of skin 11/17/2020   Respiratory arrest (HCC) 11/14/2020   Acute respiratory failure with hypoxia (HCC) 11/14/2020   Adverse effects of medication 11/14/2020   Hypotension 11/14/2020   Encounter for central line placement    Aortic dissection (HCC) 11/13/2020   Acute ischemic right MCA stroke (HCC) 11/13/2020   Agitation 11/13/2020   Atelectasis    Phlebitis    Polysubstance abuse (HCC)    Elevated troponin 11/07/2020   New onset left bundle branch block (LBBB) 11/07/2020   Protein-calorie malnutrition, severe 11/07/2020   Encephalopathy acute    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 [place], [place], [address] Phone: [phone]   Fax:  [phone]   Name: Kaspar Albornoz MRN: [id number] Date of Birth: Jul 07, 1975

## 2021-06-03 NOTE — Therapy (Signed)
[facility] [address] [place], [place], [address] Phone: [phone]   Fax:  [phone]  Patient Details  Name: Morey Frerichs MRN: [id number] Date of Birth: [birth year] Referring Provider:  Dorna Mai, MD  Encounter Date: 06/03/2021   Pt arrived to session from SLP office.  Allowed pt to be seated x approx 4-5 mins while checking pt into computer.  Pt/wife report that he is still getting dizzy and unsteady at times, esp when he is going from sitting to standing.  PT assessed BP in sitting which was 133/104 and standing was 145/108.  Wife reports that it is this high at home a lot of the time and that a good day may be 140's/90's.  Educated that due to history and safety concerns, we would not be able to do therapy with BP that elevated and recommend they get with cardiology to let them know so that he may get guidance on what to do next.  PT kept next week's appt and educated them to check BP before coming to therapy next week so that if its too elevated, they can call and cancel rather than coming all the way to clinic. Pt and wife verbalized understanding.   Cameron Sprang, PT, MPT [facility] [address] [place], [place], [address] Phone: [phone]   Fax:  [phone] 06/03/21, 11:32 AM

## 2021-06-08 ENCOUNTER — Other Ambulatory Visit: Payer: Self-pay

## 2021-06-08 ENCOUNTER — Ambulatory Visit: Payer: Medicaid Other | Admitting: Speech Pathology

## 2021-06-08 ENCOUNTER — Encounter: Payer: Self-pay | Admitting: Speech Pathology

## 2021-06-08 DIAGNOSIS — R4701 Aphasia: Secondary | ICD-10-CM | POA: Diagnosis not present

## 2021-06-08 DIAGNOSIS — R482 Apraxia: Secondary | ICD-10-CM

## 2021-06-08 NOTE — Therapy (Signed)
[facility] [address] [place], [place], [address] Phone: [phone]   Fax:  [phone]  Speech Language Pathology Treatment  Patient Details  Name: Jesse [name] MRN: [id number] Date of Birth: 1975-04-30 Referring Provider (SLP): Dr. Dorna Mai   Encounter Date: 06/08/2021   End of Session - 06/08/21 1203     Visit Number 4    Number of Visits 15    Date for SLP Re-Evaluation 08/05/21    Authorization Type Medicaid  PT is requesting 6 visits, I am requesting 53, so that will leave him with 10 for the remainder of the year    Authorization - Visit Number 3    Authorization - Number of Visits 11    SLP Start Time 1100    SLP Stop Time  1147    SLP Time Calculation (min) 47 min    Activity Tolerance Patient tolerated treatment well             Past Medical History:  Diagnosis Date   Anxiety    Depression    Essential hypertension    Myocardial infarction ([place])    S/P aortic dissection repair 11/05/2020   Straight graft replacement of ascending aorta and proximal transverse aortic arch with re-suspension of native aortic valve and open hemi arch distal anastomosis with aorta to right common carotid bypass and aorta to right subclavian bypass   Stroke [facility])     Past Surgical History:  Procedure Laterality Date   ASCENDING AORTIC ROOT REPLACEMENT N/A 11/04/2020   Procedure: 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 WITH REPLACEMENT OF ASCENDING AORTA AND HEMIARCH USING HEMASHIELD PLATINUM 26MM GRAFT AND HEMASHIELD GOLD 14 X 8MM GRAFT, RESUSPENSION OF NATIVE VALVE, AORTA TO RIGHT CAROTID BYPASS, AORTA TO RIGHT SUBCLAVIAN BYPASS;  Surgeon: Rexene Alberts, MD;  Location: [facility];  Service: Open Heart Surgery;  Laterality: N/A;   TEE WITHOUT CARDIOVERSION N/A 11/04/2020   Procedure: TRANSESOPHAGEAL ECHOCARDIOGRAM (TEE);  Surgeon: Rexene Alberts, MD;  Location: [facility];  Service: Open Heart Surgery;   Laterality: N/A;    There were no vitals filed for this visit.   Subjective Assessment - 06/08/21 1104     Subjective "The same"    Patient is accompained by: Family member   wife, Barnett Applebaum   Currently in Pain? No/denies                   ADULT SLP TREATMENT - 06/08/21 1104       General Information   Behavior/Cognition Alert;Cooperative;Pleasant mood;Requires cueing      Treatment Provided   Treatment provided Cognitive-Linquistic      Cognitive-Linquistic Treatment   Treatment focused on Aphasia;Apraxia;Patient/family/caregiver education    Skilled Treatment Targeted verbal descriptions as compensation generating 3 sentence description of simple objects with frequent questioning cues initially which faded to occasional questioning cues and extended time required consistently.Auditory comprehension targeted by naming object when given 3 sentence description. [name] requested repeition 6/10 descriptions. Sentence generation targeted generating 2 sentences for 6 multiple meaning words with frequent mod verbal cues for 2nd meaning and frequent mod verbal cues for grammar. Trained pt and spouse in HEP to target verbal compensations. She required min A to generate salient description and to cue [name] for use of verbal descriptions      Assessment / Recommendations / Plan   Plan Continue with current plan of care              SLP Education -  06/08/21 1157     Education Details HEP for aphasia, cue pt to use verbal compensations    Person(s) Educated Patient;Spouse    Methods Explanation;Demonstration;Verbal cues    Comprehension Verbalized understanding;Returned demonstration;Verbal cues required;Need further instruction              SLP Short Term Goals - 06/08/21 1201       SLP SHORT TERM GOAL #2   Title Pt will ID and self correct paraphasias in structured task 8/10 with occasional min A    Baseline 50% ID errors on eval    Time 2    Period Weeks    Status  On-going      SLP SHORT TERM GOAL #3   Title Pt will increase utterance legnth to 5-7 words with occasional min A    Baseline 3-4 words    Time 2    Period Weeks    Status On-going      SLP SHORT TERM GOAL #4   Title Pt will use compensations for aphasia 75% of anomic episodes    Baseline 50%    Time 2    Period Weeks    Status On-going              SLP Long Term Goals - 06/08/21 1202       SLP LONG TERM GOAL #1   Title In structured task, such as VNeST, pt will generate complex sentnece with occasional min A and extended time    Baseline simple phrases only on eval    Time 10    Period Weeks    Status On-going      SLP LONG TERM GOAL #2   Title pt will functionally communicate in 5 minutes simple conversation using compensations in 2 sessions    Baseline 2 minutes    Time 10    Period Weeks    Status On-going      SLP LONG TERM GOAL #3   Title Spouse will use 2 strategies to A pt with auditory comprehension 3x    Baseline not demonstrated    Time 10    Period Weeks    Status On-going      SLP LONG TERM GOAL #4   Title Pt will write personal information and family names with occasional min A    Baseline his name only    Time 10    Period Weeks    Status On-going      SLP LONG TERM GOAL #5   Title Pt/spouse will improve score on Communication Participation Item Bank by 2 points    Baseline score of 9    Time 10    Period Weeks    Status On-going              Plan - 06/08/21 1158     Clinical Impression Statement Ongoing moderate aphasia and verbal apraxia  persists. Trained in verbal compensations for aphasia and trained spouse to cue for verbal compensations when needed. [name] is attempting self correction of apraxic and aphasic errors with min A. MLU continues to improve in structured tasks targeting sentence generation. Continue skilled ST to maximize communication for safety, independence and QOL and to reduce frustration    Speech Therapy  Frequency 2x / week    Duration 8 weeks   1x a week for 3 weeks, then 2x a week for 4 weeks   Treatment/Interventions Compensatory techniques;Functional tasks;Multimodal communcation approach;Cueing hierarchy;SLP instruction and feedback;Language facilitation;Cognitive reorganization;Compensatory strategies;Internal/external aids;Patient/family  education    Potential to Achieve Goals Good             Patient will benefit from skilled therapeutic intervention in order to improve the following deficits and impairments:   Aphasia  Verbal apraxia    Problem List Patient Active Problem List   Diagnosis Date Noted   Altered sensation due to recent stroke 02/19/2021   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   AKI (acute kidney injury) ([place])    Benign essential HTN    Hyponatremia    Acute blood loss anemia    Urinary retention    Pressure injury of skin 11/17/2020   Respiratory arrest ([place]) 11/14/2020   Acute respiratory failure with hypoxia ([facility]) 11/14/2020   Adverse effects of medication 11/14/2020   Hypotension 11/14/2020   Encounter for central line placement    Aortic dissection ([place]) 11/13/2020   Acute ischemic right MCA stroke ([place]) 11/13/2020   Agitation 11/13/2020   Atelectasis    Phlebitis    Polysubstance abuse ([place])    Elevated troponin 11/07/2020   New onset left bundle branch block (LBBB) 11/07/2020   Protein-calorie malnutrition, severe 11/07/2020   Encephalopathy acute    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 [place], [place], [address] Phone: [phone]   Fax:  [phone]   Name:  Yassin Kravets MRN: [id number] Date of Birth: November 30, 1975

## 2021-06-10 ENCOUNTER — Ambulatory Visit: Payer: Medicaid Other

## 2021-06-10 ENCOUNTER — Ambulatory Visit: Payer: Medicaid Other | Admitting: Speech Pathology

## 2021-06-10 ENCOUNTER — Encounter: Payer: Self-pay | Admitting: Speech Pathology

## 2021-06-15 ENCOUNTER — Ambulatory Visit: Payer: Medicaid Other

## 2021-06-15 ENCOUNTER — Other Ambulatory Visit: Payer: Self-pay

## 2021-06-15 ENCOUNTER — Encounter: Payer: Self-pay | Admitting: Physical Medicine & Rehabilitation

## 2021-06-15 DIAGNOSIS — R4701 Aphasia: Secondary | ICD-10-CM | POA: Diagnosis not present

## 2021-06-15 DIAGNOSIS — R482 Apraxia: Secondary | ICD-10-CM

## 2021-06-15 NOTE — Therapy (Signed)
[facility] [address] [place], [place], [address] Phone: [phone]   Fax:  [phone]  Speech Language Pathology Treatment  Patient Details  Name: Jesse [name] MRN: [id number] Date of Birth: 10/23/1975 Referring Provider (SLP): Dr. Georganna Skeans   Encounter Date: 06/15/2021   End of Session - 06/15/21 1056     Visit Number 5    Number of Visits 15    Date for SLP Re-Evaluation 08/05/21    Authorization Type Medicaid  PT is requesting 6 visits, I am requesting 11, so that will leave him with 10 for the remainder of the year    Authorization - Visit Number 4    Authorization - Number of Visits 11    SLP Start Time 1100    SLP Stop Time  1145    SLP Time Calculation (min) 45 min    Activity Tolerance Patient tolerated treatment well             Past Medical History:  Diagnosis Date   Anxiety    Depression    Essential hypertension    Myocardial infarction ([facility])    S/P aortic dissection repair 11/05/2020   Straight graft replacement of ascending aorta and proximal transverse aortic arch with re-suspension of native aortic valve and open hemi arch distal anastomosis with aorta to right common carotid bypass and aorta to right subclavian bypass   Stroke [facility])     Past Surgical History:  Procedure Laterality Date   ASCENDING AORTIC ROOT REPLACEMENT N/A 11/04/2020   Procedure: 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 WITH REPLACEMENT OF ASCENDING AORTA AND HEMIARCH USING HEMASHIELD PLATINUM GRAFT AND HEMASHIELD GOLD 14 X GRAFT, RESUSPENSION OF NATIVE VALVE, AORTA TO RIGHT CAROTID BYPASS, AORTA TO RIGHT SUBCLAVIAN BYPASS;  Surgeon: Purcell Nails, MD;  [facility];  Service: Open Heart Surgery;  Laterality: N/A;   TEE WITHOUT CARDIOVERSION N/A 11/04/2020   Procedure: TRANSESOPHAGEAL ECHOCARDIOGRAM (TEE);  Surgeon: Purcell Nails, MD;  Location: [facility];  Service: Open Heart Surgery;   Laterality: N/A;    There were no vitals filed for this visit.   Subjective Assessment - 06/15/21 1058     Subjective "doing good"    Patient is accompained by: Family member   Gina   Currently in Pain? No/denies                   ADULT SLP TREATMENT - 06/15/21 1056       General Information   Behavior/Cognition Alert;Cooperative;Pleasant mood;Requires cueing      Treatment Provided   Treatment provided Cognitive-Linquistic      Cognitive-Linquistic Treatment   Treatment focused on Aphasia;Apraxia;Patient/family/caregiver education    Skilled Treatment Pt entered with increased MLU and improved word finding in functional opening conversation. Pt reported frustration with reading seemingly related to both comprehension and oral reading errors. SLP targeted  oral reading at paragraph level, in which pt exhibited some good awareness of content words.  Occasional cues required for functor words, in which SLP educated this is typical. Comprehension required usual extra processing time and occasional cues. SLP recommended patient read silently 2-3x prior to reading aloud. SLP provided reading as part of HEP. SLP also recommended functional tasks to engage patient at home to aid functional participation (ex: plan out garden). SLP also recommended consulting MD re: increased mood swings, feelings of frustration, and deduced motivation.      Assessment / Recommendations / Plan   Plan  Continue with current plan of care      Progression Toward Goals   Progression toward goals Progressing toward goals              SLP Education - 06/15/21 1130     Education Details consult MD re: mood managment, HEP for reading    Person(s) Educated Patient;Spouse    Methods Explanation;Demonstration;Verbal cues;Handout    Comprehension Verbalized understanding;Returned demonstration;Need further instruction              SLP Short Term Goals - 06/15/21 1057       SLP SHORT TERM GOAL  #2   Title Pt will ID and self correct paraphasias in structured task 8/10 with occasional min A    Baseline 50% ID errors on eval    Time 1    Period Weeks    Status On-going      SLP SHORT TERM GOAL #3   Title Pt will increase utterance legnth to 5-7 words with occasional min A    Baseline 3-4 words    Time 1    Period Weeks    Status On-going      SLP SHORT TERM GOAL #4   Title Pt will use compensations for aphasia 75% of anomic episodes    Baseline 50%; 06-15-21    Time 1    Period Weeks    Status On-going              SLP Long Term Goals - 06/15/21 1057       SLP LONG TERM GOAL #1   Title In structured task, such as VNeST, pt will generate complex sentnece with occasional min A and extended time    Baseline simple phrases only on eval    Time 9    Period Weeks    Status On-going      SLP LONG TERM GOAL #2   Title pt will functionally communicate in 5 minutes simple conversation using compensations in 2 sessions    Baseline 2 minutes    Time 9    Period Weeks    Status On-going      SLP LONG TERM GOAL #3   Title Spouse will use 2 strategies to A pt with auditory comprehension 3x    Baseline not demonstrated    Time 9    Period Weeks    Status On-going      SLP LONG TERM GOAL #4   Title Pt will write personal information and family names with occasional min A    Baseline his name only    Time 9    Period Weeks    Status On-going      SLP LONG TERM GOAL #5   Title Pt/spouse will improve score on Communication Participation Item Bank by 2 points    Baseline score of 9    Time 9    Period Weeks    Status On-going              Plan - 06/15/21 1056     Clinical Impression Statement 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 Trained in verbal and comprehension compensations for reading as pt identified this as current personal goal. Nida Boatman is attempting self correction of apraxic and aphasic errors with min to mod A  for oral reading. MLU continues to improve in simple conversation and on structured tasks targeting sentence generation. Continue skilled ST to maximize communication for safety, independence and QOL and to reduce  frustration    Speech Therapy Frequency 2x / week    Duration 8 weeks   1x a week for 3 weeks, then 2x a week for 4 weeks   Treatment/Interventions Compensatory techniques;Functional tasks;Multimodal communcation approach;Cueing hierarchy;SLP instruction and feedback;Language facilitation;Cognitive reorganization;Compensatory strategies;Internal/external aids;Patient/family education    Potential to Achieve Goals Good             Patient will benefit from skilled therapeutic intervention in order to improve the following deficits and impairments:   Aphasia  Verbal apraxia    Problem List Patient Active Problem List   Diagnosis Date Noted   Altered sensation due to recent stroke 02/19/2021   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   AKI (acute kidney injury) (HCC)    Benign essential HTN    Hyponatremia    Acute blood loss anemia    Urinary retention    Pressure injury of skin 11/17/2020   Respiratory arrest ([facility]CC) 11/14/2020   Acute respiratory failure with hypoxia (HCC) 11/14/2020   Adverse effects of medication 11/14/2020   Hypotension 11/14/2020   Encounter for central line placement    Aortic dissection ([facility]) 11/13/2020   Acute ischemic right MCA stroke (HCC) 11/13/2020   Agitation 11/13/2020   Atelectasis    Phlebitis    Polysubstance abuse (HCC)    Elevated troponin 11/07/2020   New onset left bundle branch block (LBBB) 11/07/2020   Protein-calorie malnutrition, severe 11/07/2020   Encephalopathy acute    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 [place], [place], [address] Phone: [phone]   Fax:  [phone]   Name: Jesse [name] MRN: [id number] Date of Birth: 1975/09/04

## 2021-06-17 ENCOUNTER — Telehealth: Payer: Self-pay | Admitting: Internal Medicine

## 2021-06-17 ENCOUNTER — Encounter: Payer: Self-pay | Admitting: Speech Pathology

## 2021-06-17 ENCOUNTER — Ambulatory Visit: Payer: Medicaid Other | Admitting: Speech Pathology

## 2021-06-17 ENCOUNTER — Ambulatory Visit: Payer: Medicaid Other | Admitting: Rehabilitation

## 2021-06-17 ENCOUNTER — Encounter: Payer: Self-pay | Admitting: Rehabilitation

## 2021-06-17 ENCOUNTER — Other Ambulatory Visit: Payer: Self-pay

## 2021-06-17 DIAGNOSIS — I69354 Hemiplegia and hemiparesis following cerebral infarction affecting left non-dominant side: Secondary | ICD-10-CM

## 2021-06-17 DIAGNOSIS — R4701 Aphasia: Secondary | ICD-10-CM

## 2021-06-17 DIAGNOSIS — R2689 Other abnormalities of gait and mobility: Secondary | ICD-10-CM

## 2021-06-17 DIAGNOSIS — M6281 Muscle weakness (generalized): Secondary | ICD-10-CM

## 2021-06-17 DIAGNOSIS — R2681 Unsteadiness on feet: Secondary | ICD-10-CM

## 2021-06-17 NOTE — Therapy (Signed)
[facility] [address] [place], [place], [address] Phone: [phone]   Fax:  [phone]  Physical Therapy Treatment  Patient Details  Name: Jesse [name] MRN: [id number] Date of Birth: 05-18-1975 Referring Provider (PT): Dorna Mai   Encounter Date: 06/17/2021   PT End of Session - 06/17/21 1558     Visit Number 2    Number of Visits 9    Date for PT Re-Evaluation 07/10/21    Authorization Type Medicaid [employer]    PT Start Time [id number]    PT Stop Time [id number]    PT Time Calculation (min) 43 min    Activity Tolerance Patient tolerated treatment well             Past Medical History:  Diagnosis Date   Anxiety    Depression    Essential hypertension    Myocardial infarction ([place])    S/P aortic dissection repair 11/05/2020   Straight graft replacement of ascending aorta and proximal transverse aortic arch with re-suspension of native aortic valve and open hemi arch distal anastomosis with aorta to right common carotid bypass and aorta to right subclavian bypass   Stroke [facility])     Past Surgical History:  Procedure Laterality Date   ASCENDING AORTIC ROOT REPLACEMENT N/A 11/04/2020   Procedure: 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 WITH REPLACEMENT OF ASCENDING AORTA AND HEMIARCH USING HEMASHIELD PLATINUM 26MM GRAFT AND HEMASHIELD GOLD 14 X 8MM GRAFT, RESUSPENSION OF NATIVE VALVE, AORTA TO RIGHT CAROTID BYPASS, AORTA TO RIGHT SUBCLAVIAN BYPASS;  Surgeon: Rexene Alberts, MD;  Location: [place];  Service: Open Heart Surgery;  Laterality: N/A;   TEE WITHOUT CARDIOVERSION N/A 11/04/2020   Procedure: TRANSESOPHAGEAL ECHOCARDIOGRAM (TEE);  Surgeon: Rexene Alberts, MD;  Location: [place];  Service: Open Heart Surgery;  Laterality: N/A;    There were no vitals filed for this visit.   Subjective Assessment - 06/17/21 1105     Subjective Has an appt with MD next week about foot pain.  Still having  fluctuating BPs. Today prior to activity was 123/99.  PT made decision to proceed with caution.    Pertinent History Pt admitted 11/04/20 with acute onset aphasia, blurry vision. CTA revealed acute aortic dissection. Pt with worsening o2 requirements requiring intubation 7/15, self extubated 7/16. 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 Transferred to CIR 7/21-7/22 and returned to acute hospital after respiratory failure with intubation x 24 hours.   PMH: Anxiety, Depression, Essential hypertension.    Patient Stated Goals Pt wants to be like I used to be and improve balance.    Currently in Pain? Yes    Pain Score --   unable to rate but wife reports she can tell its "bad"   Pain Location Foot    Pain Orientation Left    Pain Descriptors / Indicators Throbbing    Pain Type Neuropathic pain    Pain Onset More than a month ago    Pain Frequency Constant    Aggravating Factors  unsure    Pain Relieving Factors nothing                               [facility] Adult PT Treatment/Exercise - 06/17/21 1115       Ambulation/Gait   Ambulation/Gait Yes    Ambulation/Gait Assistance 5: Supervision  Ambulation/Gait Assistance Details During session from task to task and during balance challenges with cues for improved posture throughout.    Ambulation Distance (Feet) 200 Feet    Assistive device None    Gait Pattern Step-through pattern    Ambulation Surface Level;Indoor      High Level Balance   High Level Balance Activities Braiding;Backward walking;Direction changes;Turns;Sudden stops;Head turns;Tandem walking;Marching forwards;Marching backwards    High Level Balance Comments Performed marching forwards/backwards at counter top x 2 laps (down and back), tandem gait at counter top x 2 laps, and braiding at counter top x 2 laps and added to HEP.  Performed seated rest break  following 2 exercises in order to re-assess BP and allow BP to come back to within parameters.  Performed high level gait with varying speeds, suddent stops, backwards walking, side stepping x 200'.  Pt able to perform at S level.      Self-Care   Self-Care Other Self-Care Comments    Other Self-Care Comments  PT provided MANY seated rest breaks today to assess allow BP to recover.  Note readings were as follows.  Following first two standing exercises at counter: 150/111, following 3-4 mins of rest was 136/100, then another 2-3 mins rest was 122/86, performed 2 more standing exercises and was 129/100, with 3-4 mins of rest was 117/95 then at end of session was 161/100.  PT did call pts cardiologist at end of day and they are to fax any precautions/guidance to [place] in regards to PT and also call pt/wife with this update as well.             Access Code: [email or web address] URL: [email or web address] Date: 06/17/2021 Prepared by: Cameron Sprang   Exercises Walking March - 1-2 x daily - 7 x weekly - 1 sets - 4 reps Walking Tandem Stance - 1-2 x daily - 7 x weekly - 1 sets - 4 reps Braided Sidestepping - 1-2 x daily - 7 x weekly - 1 sets - 4 reps Romberg Stance on Foam Pad with Head Rotation - 1-2 x daily - 7 x weekly - 1 sets - 10 reps Romberg Stance with Head Nods on Foam Pad - 1 x daily - 7 x weekly - 1 sets - 10 reps          PT Short Term Goals - 05/13/21 1931       PT SHORT TERM GOAL #1   Title Pt will perform HEP with family supervision for improved strength, balance, and gait.    Baseline need to add to current activities    Time 4    Period Weeks    Status New    Target Date 06/10/21      PT SHORT TERM GOAL #2   Title Pt will increase 30 sec sit to stand from 9 reps to >11 reps for improved functional strength.    Baseline 05/13/21 9 reps without hands from chair    Time 4    Period Weeks    Status New    Target Date 06/10/21      PT SHORT TERM GOAL #3   Title  Pt will increase FGA from 20/30 to >22/30 for improved balance and gait safety.    Baseline 05/13/21 20/30    Time 4    Period Weeks    Status New    Target Date 06/10/21               PT Long Term Goals - 05/13/21  1934       PT LONG TERM GOAL #1   Title Pt will increase FGA from 20/30 to >24/30 for improved balance and gait safety.    Baseline 05/13/21 20/30    Time 8    Period Weeks    Status New    Target Date 07/10/21      PT LONG TERM GOAL #2   Title Pt will increase gait speed form 0.63m/s to >1.32m/s for improved community mobility.    Baseline 05/13/21 0.73m/s    Time 8    Period Weeks    Status New    Target Date 07/10/21      PT LONG TERM GOAL #3   Title Pt will increase left SLS to 5 sec or better for improved standing balance.    Baseline 05/13/21 1 sec    Time 8    Period Weeks    Status New    Target Date 07/10/21      PT LONG TERM GOAL #4   Title Pt will ambulate >1000' on varied surfaces without AD independently scanning environment without LOB for improved community mobility.    Baseline 200' supervision    Time 8    Period Weeks    Status New    Target Date 07/10/21                   Plan - 06/17/21 1559     Clinical Impression Statement Pts continues to be limited due to fluctuating BPs at home and also in clinic with BP elevating at times over [id number] diastolically with exercise.  PT did provide HEP which she recommend he do an exercise, then check BP and rest at home before completing next one.  PT also did call MD and is awaiting any guidance/precautions we may have.  If not resolved, may need to put on hold until he sees cardio MD March 14.    Personal Factors and Comorbidities Comorbidity 3+;Time since onset of injury/illness/exacerbation    Comorbidities Anxiety, Depression, Essential hypertension. 1    Examination-Activity Limitations Stairs;Locomotion Level;Transfers    Examination-Participation Restrictions Occupation;Community  Activity;Driving;Interpersonal Relationship    Stability/Clinical Decision Making Evolving/Moderate complexity    Rehab Potential Good    PT Frequency 1x / week   plus eval   PT Duration 8 weeks    PT Treatment/Interventions ADLs/Self Care Home Management;DME Instruction;Gait training;Stair training;Functional mobility training;Therapeutic activities;Therapeutic exercise;Balance training;Neuromuscular re-education;Passive range of motion;Patient/family education;Orthotic Fit/Training;Vestibular    PT Next Visit Plan Monitor vitals, did we hear from cardiologist??Check STGs. High level balance training- gait with horizontal head turns, tandem walking, walking on compliant surfaces, SLS activities.    Consulted and Agree with Plan of Care Patient    Family Member Consulted wife, Barnett Applebaum             Patient will benefit from skilled therapeutic intervention in order to improve the following deficits and impairments:  Abnormal gait, Decreased mobility, Decreased strength, Decreased balance, Impaired sensation  Visit Diagnosis: Unsteadiness on feet  Other abnormalities of gait and mobility  Muscle weakness (generalized)  Hemiplegia and hemiparesis following cerebral infarction affecting left non-dominant side [facility])     Problem List Patient Active Problem List   Diagnosis Date Noted   Altered sensation due to recent stroke 02/19/2021   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   AKI (acute kidney injury) ([place])    Benign essential HTN    Hyponatremia    Acute blood loss anemia    Urinary retention  Pressure injury of skin 11/17/2020   Respiratory arrest ([place]) 11/14/2020   Acute respiratory failure with hypoxia ([place]) 11/14/2020   Adverse effects of medication 11/14/2020   Hypotension 11/14/2020   Encounter for central line placement    Aortic dissection ([place]) 11/13/2020   Acute ischemic right MCA stroke ([place]) 11/13/2020   Agitation 11/13/2020   Atelectasis    Phlebitis    Polysubstance  abuse ([place])    Elevated troponin 11/07/2020   New onset left bundle branch block (LBBB) 11/07/2020   Protein-calorie malnutrition, severe 11/07/2020   Encephalopathy acute    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

## 2021-06-17 NOTE — Therapy (Signed)
[facility] [address] [place], [place], [address] Phone: [phone]   Fax:  [phone]  Speech Language Pathology Treatment  Patient Details  Name: Jesse [name] MRN: [id number] Date of Birth: August 23, 1975 Referring Provider (SLP): Dr. Georganna Skeans   Encounter Date: 06/17/2021   End of Session - 06/17/21 1227     Visit Number 6    Number of Visits 15    Date for SLP Re-Evaluation 08/05/21    Authorization Type Medicaid  PT is requesting 6 visits, I am requesting 11, so that will leave him with 10 for the remainder of the year    Authorization - Visit Number 5    Authorization - Number of Visits 11    SLP Start Time 1015    SLP Stop Time  1100    SLP Time Calculation (min) 45 min    Activity Tolerance Patient tolerated treatment well             Past Medical History:  Diagnosis Date   Anxiety    Depression    Essential hypertension    Myocardial infarction ([facility])    S/P aortic dissection repair 11/05/2020   Straight graft replacement of ascending aorta and proximal transverse aortic arch with re-suspension of native aortic valve and open hemi arch distal anastomosis with aorta to right common carotid bypass and aorta to right subclavian bypass   Stroke [facility])     Past Surgical History:  Procedure Laterality Date   ASCENDING AORTIC ROOT REPLACEMENT N/A 11/04/2020   Procedure: 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 WITH REPLACEMENT OF ASCENDING AORTA AND HEMIARCH USING HEMASHIELD PLATINUM GRAFT AND HEMASHIELD GOLD 14 X GRAFT, RESUSPENSION OF NATIVE VALVE, AORTA TO RIGHT CAROTID BYPASS, AORTA TO RIGHT SUBCLAVIAN BYPASS;  Surgeon: Purcell Nails, MD;  [facility];  Service: Open Heart Surgery;  Laterality: N/A;   TEE WITHOUT CARDIOVERSION N/A 11/04/2020   Procedure: TRANSESOPHAGEAL ECHOCARDIOGRAM (TEE);  Surgeon: Purcell Nails, MD;  [facility];  Service: Open Heart Surgery;   Laterality: N/A;    There were no vitals filed for this visit.   Subjective Assessment - 06/17/21 1016     Subjective "I'm doing great" (that's sarcasm)    Patient is accompained by: Family member   [name], spouse   Currently in Pain? No/denies                   ADULT SLP TREATMENT - 06/17/21 1018       General Information   Behavior/Cognition Alert;Cooperative;Pleasant mood;Requires cueing      Treatment Provided   Treatment provided Cognitive-Linquistic      Cognitive-Linquistic Treatment   Treatment focused on Aphasia;Apraxia;Patient/family/caregiver education    Skilled Treatment   Targeted word finding, verbal apraxia, sentence generation with Scientist, product/process development (VNeST). For the verbs cast, paint, the patient generated 3 subject and objects for each verb, a total of 6 subjects and objects with frequent mod verbal, questioning, written cues required. Pt generated 2 complex sentences, 1 for each verb, by answering "wh" questions with frerquent mod cues, for a total of 2 complex sentences. Targeted verbal compensations for aphasia generating SFA type descriptions for basic objects - With frequent mod questioning cues, [name] generated salient descriptions for 6 objects. Targeted auditory comprehension and word finding with convergent naming task with accuracy 6/8, extended time and cues for 2/8      Assessment / Recommendations / Plan   Plan Continue  with current plan of care      Progression Toward Goals   Progression toward goals Progressing toward goals              SLP Education - 06/17/21 1226     Education Details Neuropsych role    Person(s) Educated Patient;Spouse    Methods Verbal cues;Handout    Comprehension Verbalized understanding;Need further instruction              SLP Short Term Goals - 06/17/21 1227       SLP SHORT TERM GOAL #2   Title Pt will ID and self correct paraphasias in structured task 8/10 with occasional min  A    Baseline 50% ID errors on eval    Time 1    Period Weeks    Status On-going      SLP SHORT TERM GOAL #3   Title Pt will increase utterance legnth to 5-7 words with occasional min A    Baseline 3-4 words    Time 1    Period Weeks    Status On-going      SLP SHORT TERM GOAL #4   Title Pt will use compensations for aphasia 75% of anomic episodes    Baseline 50%; 06-15-21    Time 1    Period Weeks    Status On-going              SLP Long Term Goals - 06/17/21 1227       SLP LONG TERM GOAL #1   Title In structured task, such as VNeST, pt will generate complex sentnece with occasional min A and extended time    Baseline simple phrases only on eval    Time 9    Period Weeks    Status On-going      SLP LONG TERM GOAL #2   Title pt will functionally communicate in 5 minutes simple conversation using compensations in 2 sessions    Baseline 2 minutes    Time 9    Period Weeks    Status On-going      SLP LONG TERM GOAL #3   Title Spouse will use 2 strategies to A pt with auditory comprehension 3x    Baseline not demonstrated    Time 9    Period Weeks    Status On-going      SLP LONG TERM GOAL #4   Title Pt will write personal information and family names with occasional min A    Baseline his name only    Time 9    Period Weeks    Status On-going      SLP LONG TERM GOAL #5   Title Pt/spouse will improve score on Communication Participation Item Bank by 2 points    Baseline score of 9    Time 9    Period Weeks    Status On-going              Plan - 06/17/21 1227     Clinical Impression Statement 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 Trained in verbal and comprehension compensations for reading as pt identified this as current personal goal. Nida Boatman is attempting self correction of apraxic and aphasic errors with min to mod A for oral reading. MLU continues to improve in simple conversation and on structured tasks  targeting sentence generation. Continue skilled ST to maximize communication for safety, independence and QOL and to reduce frustration    Speech Therapy Frequency 2x /  week    Duration 8 weeks   1x a week for 3 weeks, 2x a week for 4 weeks   Treatment/Interventions Compensatory techniques;Functional tasks;Multimodal communcation approach;Cueing hierarchy;SLP instruction and feedback;Language facilitation;Cognitive reorganization;Compensatory strategies;Internal/external aids;Patient/family education    Potential to Achieve Goals Good    Potential Considerations Financial resources             Patient will benefit from skilled therapeutic intervention in order to improve the following deficits and impairments:   Aphasia    Problem List Patient Active Problem List   Diagnosis Date Noted   Altered sensation due to recent stroke 02/19/2021   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   AKI (acute kidney injury) ([facility])    Benign essential HTN    Hyponatremia    Acute blood loss anemia    Urinary retention    Pressure injury of skin 11/17/2020   Respiratory arrest ([facility]) 11/14/2020   Acute respiratory failure with hypoxia ([facility]) 11/14/2020   Adverse effects of medication 11/14/2020   Hypotension 11/14/2020   Encounter for central line placement    Aortic dissection ([facility]) 11/13/2020   Acute ischemic right MCA stroke ([facility]) 11/13/2020   Agitation 11/13/2020   Atelectasis    Phlebitis    Polysubstance abuse ([facility])    Elevated troponin 11/07/2020   New onset left bundle branch block (LBBB) 11/07/2020   Protein-calorie malnutrition, severe 11/07/2020   Encephalopathy acute    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 [place], [place], [address] Phone: [phone]   Fax:  [phone]   Name: Jesse [name] MRN: [id number] Date of Birth: [birth year]

## 2021-06-17 NOTE — Patient Instructions (Signed)
Access Code: [email or web address] URL: [email or web address] Date: 06/17/2021 Prepared by: Harriet Butte  Exercises Walking March - 1-2 x daily - 7 x weekly - 1 sets - 4 reps Walking Tandem Stance - 1-2 x daily - 7 x weekly - 1 sets - 4 reps Braided Sidestepping - 1-2 x daily - 7 x weekly - 1 sets - 4 reps Romberg Stance on Foam Pad with Head Rotation - 1-2 x daily - 7 x weekly - 1 sets - 10 reps Romberg Stance with Head Nods on Foam Pad - 1 x daily - 7 x weekly - 1 sets - 10 reps

## 2021-06-17 NOTE — Telephone Encounter (Signed)
[facility] called. The patient was at therapy today and his BP became elevated after simple exercises.  Pt c/o BP issue: STAT if pt c/o blurred vision, one-sided weakness or slurred speech  1. What are your last 5 BP readings?  150/111  2. Are you having any other symptoms (ex. Dizziness, headache, blurred vision, passed out)? Patient did not have symptoms today during therapy  3. What is your BP issue? [facility] need to know the parameters for when they can and can not treat the patient. Please fax recommendations to [phone]  Per [facility] the patient's wife [name]  is the best contact if information needs to be relayed to the patient

## 2021-06-17 NOTE — Patient Instructions (Signed)
° °  Dr. Kieth Brightly is great at counseling for coping strategies after a stroke. It would be helpful to let him know about Jesse [name]'s frustration/outbursts - he can help with this

## 2021-06-18 NOTE — Telephone Encounter (Signed)
Spoke with wife. Patient scheduled  for 2/28 at [facility].

## 2021-06-22 ENCOUNTER — Other Ambulatory Visit: Payer: Self-pay

## 2021-06-22 ENCOUNTER — Ambulatory Visit: Payer: Medicaid Other | Admitting: Speech Pathology

## 2021-06-22 ENCOUNTER — Encounter: Payer: Self-pay | Admitting: Speech Pathology

## 2021-06-22 DIAGNOSIS — R4701 Aphasia: Secondary | ICD-10-CM

## 2021-06-22 NOTE — Therapy (Signed)
[facility] [address] [place], [place], [address] Phone: [phone]   Fax:  [phone]  Speech Language Pathology Treatment  Patient Details  Name: Jesse [name] MRN: [id number] Date of Birth: 1975-06-29 Referring Provider (SLP): Dr. Dorna Mai   Encounter Date: 06/22/2021   End of Session - 06/22/21 1202     Visit Number 7    Number of Visits 15    Date for SLP Re-Evaluation 08/05/21    Authorization Type Medicaid  PT is requesting 6 visits, I am requesting 20, so that will leave him with 10 for the remainder of the year    Authorization - Visit Number 6    Authorization - Number of Visits 11    SLP Start Time 1100    SLP Stop Time  1145    SLP Time Calculation (min) 45 min    Activity Tolerance Patient tolerated treatment well             Past Medical History:  Diagnosis Date   Anxiety    Depression    Essential hypertension    Myocardial infarction ([place])    S/P aortic dissection repair 11/05/2020   Straight graft replacement of ascending aorta and proximal transverse aortic arch with re-suspension of native aortic valve and open hemi arch distal anastomosis with aorta to right common carotid bypass and aorta to right subclavian bypass   Stroke [facility])     Past Surgical History:  Procedure Laterality Date   ASCENDING AORTIC ROOT REPLACEMENT N/A 11/04/2020   Procedure: 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 WITH REPLACEMENT OF ASCENDING AORTA AND HEMIARCH USING HEMASHIELD PLATINUM 26MM GRAFT AND HEMASHIELD GOLD 14 X 8MM GRAFT, RESUSPENSION OF NATIVE VALVE, AORTA TO RIGHT CAROTID BYPASS, AORTA TO RIGHT SUBCLAVIAN BYPASS;  Surgeon: Rexene Alberts, MD;  Location: [facility];  Service: Open Heart Surgery;  Laterality: N/A;   TEE WITHOUT CARDIOVERSION N/A 11/04/2020   Procedure: TRANSESOPHAGEAL ECHOCARDIOGRAM (TEE);  Surgeon: Rexene Alberts, MD;  Location: [facility];  Service: Open Heart Surgery;   Laterality: N/A;    There were no vitals filed for this visit.   Subjective Assessment - 06/22/21 1107     Subjective "We got outside - it's better"    Patient is accompained by: Family member   Jesse [name], spouse   Currently in Pain? Yes    Pain Score --   didn't rate   Pain Location Foot    Pain Orientation Left    Pain Descriptors / Indicators Throbbing    Pain Type Neuropathic pain                   ADULT SLP TREATMENT - 06/22/21 1111       General Information   Behavior/Cognition Alert;Cooperative;Pleasant mood;Requires cueing      Treatment Provided   Treatment provided Cognitive-Linquistic      Cognitive-Linquistic Treatment   Treatment focused on Aphasia;Apraxia;Patient/family/caregiver education    Skilled Treatment Jesse [name] enters room with fluent speech. In conversation he generated 12 music groups he likes and 13 car restore words with rare min A. He spontaneously used gestures 3x and verbal compensations 3x during conversation. Trained spouse to give Jesse [name] extra time before saying words for him and to use questions to elicit verbal compensations rather than guessing what he is trying to say      Assessment / Recommendations / [place] with current plan of care      Progression Toward  Goals   Progression toward goals Progressing toward goals              SLP Education - 06/22/21 1154     Education Details allow Jesse [name] extra time to get words out before you give him the word    Person(s) Educated Patient;Spouse    Methods Demonstration;Verbal cues    Comprehension Verbalized understanding;Need further instruction;Verbal cues required              SLP Short Term Goals - 06/22/21 1201       SLP SHORT TERM GOAL #1   Status Achieved      SLP SHORT TERM GOAL #2   Title Pt will ID and self correct paraphasias in structured task 8/10 with occasional min A    Baseline 50% ID errors on eval    Time 1    Period Weeks    Status Achieved       SLP SHORT TERM GOAL #3   Title Pt will increase utterance legnth to 5-7 words with occasional min A    Baseline 3-4 words    Time 1    Period Weeks    Status Achieved      SLP SHORT TERM GOAL #4   Title Pt will use compensations for aphasia 75% of anomic episodes    Baseline 50%; 06-15-21    Time 1    Period Weeks    Status Achieved              SLP Long Term Goals - 06/22/21 1201       SLP LONG TERM GOAL #1   Title In structured task, such as VNeST, pt will generate complex sentnece with occasional min A and extended time    Baseline simple phrases only on eval    Time 8    Period Weeks    Status On-going      SLP LONG TERM GOAL #2   Title pt will functionally communicate in 5 minutes simple conversation using compensations in 2 sessions    Baseline 2 minutes; 06/22/21    Time 8    Period Weeks    Status On-going      SLP LONG TERM GOAL #3   Title Spouse will use 2 strategies to A pt with auditory comprehension 3x    Baseline not demonstrated    Time 8    Period Weeks    Status On-going      SLP LONG TERM GOAL #4   Title Pt will write personal information and family names with occasional min A    Baseline his name only    Time 8    Period Weeks    Status On-going      SLP LONG TERM GOAL #5   Title Pt/spouse will improve score on Communication Participation Item Bank by 2 points    Baseline score of 9    Time 8    Period Weeks    Status On-going              Plan - 06/22/21 1154     Clinical Impression Statement Improving mild to moderate aphasia with improved fluency and word finding today. Jesse [name] is using compensations (verbal and non verbal) for aphasia in conversation with min A in [place] also reports improved use of compensations at home. With persoanlly relevant categories, Jesse [name] naming 12-13 words with rare min A. Jesse [name] and Jesse [name] feel improvement is from being with his brother and father  and working on a classic truck which he has not done since  [facility]. Affect much improved. Continue skilled ST to maximize communication for safety, independence, QOL and reduced frustration    Speech Therapy Frequency 2x / week   1x a weekfor 3 weeks, 2x a week for 4 weeks   Duration 8 weeks    Treatment/Interventions Compensatory techniques;Functional tasks;Multimodal communcation approach;Cueing hierarchy;SLP instruction and feedback;Language facilitation;Cognitive reorganization;Compensatory strategies;Internal/external aids;Patient/family education    Potential to Achieve Goals Good             Patient will benefit from skilled therapeutic intervention in order to improve the following deficits and impairments:   Aphasia    Problem List Patient Active Problem List   Diagnosis Date Noted   Altered sensation due to recent stroke 02/19/2021   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   AKI (acute kidney injury) ([place])    Benign essential HTN    Hyponatremia    Acute blood loss anemia    Urinary retention    Pressure injury of skin 11/17/2020   Respiratory arrest ([place]) 11/14/2020   Acute respiratory failure with hypoxia ([place]) 11/14/2020   Adverse effects of medication 11/14/2020   Hypotension 11/14/2020   Encounter for central line placement    Aortic dissection ([place]) 11/13/2020   Acute ischemic right MCA stroke ([place]) 11/13/2020   Agitation 11/13/2020   Atelectasis    Phlebitis    Polysubstance abuse ([place])    Elevated troponin 11/07/2020   New onset left bundle branch block (LBBB) 11/07/2020   Protein-calorie malnutrition, severe 11/07/2020   Encephalopathy acute    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)    Acute hypoxemic respiratory failure ([place])    Acute thoracic aortic dissection 11/05/2020   S/P aortic dissection repair 11/05/2020   Cerebral embolism with cerebral infarction 11/05/2020   Essential hypertension     Yanelis Osika, Annye Rusk, CCC-SLP 06/22/2021, 12:03 PM  [facility] [address] [place], [place], [address] Phone: [phone]   Fax:  [phone]   Name: Ethaniel Stuver MRN: [id number] Date of Birth: 11-05-1975

## 2021-06-23 ENCOUNTER — Encounter (HOSPITAL_BASED_OUTPATIENT_CLINIC_OR_DEPARTMENT_OTHER): Payer: Self-pay | Admitting: Internal Medicine

## 2021-06-23 ENCOUNTER — Ambulatory Visit (HOSPITAL_BASED_OUTPATIENT_CLINIC_OR_DEPARTMENT_OTHER): Payer: Medicaid Other | Admitting: Internal Medicine

## 2021-06-23 VITALS — BP 136/90 | HR 60 | Ht 76.0 in | Wt 150.6 lb

## 2021-06-23 DIAGNOSIS — I951 Orthostatic hypotension: Secondary | ICD-10-CM

## 2021-06-23 DIAGNOSIS — I10 Essential (primary) hypertension: Secondary | ICD-10-CM | POA: Diagnosis not present

## 2021-06-23 DIAGNOSIS — Z8679 Personal history of other diseases of the circulatory system: Secondary | ICD-10-CM

## 2021-06-23 MED ORDER — LOSARTAN POTASSIUM 50 MG PO TABS: 50.0000 mg | ORAL_TABLET | Freq: Every day | ORAL | 3 refills | Status: DC

## 2021-06-23 NOTE — Progress Notes (Signed)
OFFICE NOTE  Chief Complaint:  Follow-up hypertension  Primary Care Physician: Georganna Skeans, MD  HPI:  Jesse [name] is a 46 y.o. male with a past medial history significant for recent presentation of acute onset aphasia and blurry vision.  Prior to this past medical history included anxiety and depression with tobacco and alcohol use but no known medical problems.  He had not been to a physician for many years.  He presented urgently to the emergency department in July with the symptoms and was found to have intracranial hemorrhage with subtle findings on CT angiogram that suggested an acute dissection involving the aortic arch extending through the innominate artery into the right common carotid artery.  Cardiothoracic surgery was emergently consulted and the patient was seen by Dr. Cornelius Moras.  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  The procedure consisted of extracorporeal bypass, replacement of the ascending aorta and proximal transverse aortic arch with a 26 mm Hemashield graft, resuspension of the needed aortic valve, aorto to right common carotid coronary bypass and aortic to right subclavian bypass with a 14 x 8 x 8 mm Hemashield bifurcated graft.  Fortunately he did survive this procedure however had a protracted stay in the ICU.  According to his wife he is lost about 40pounds currently down to 130 pounds with a BMI of 15.85.  Initially was noted to be markedly hypertensive and he had been on a number of medicines for blood pressure.  More recently he has noted to have issues with hypotension.  He was seen in urgent care and was found to be dizzy and has had issues with positional dizziness.  He was also found to have a urinary tract infection.  He does have a urinary catheter in place and is scheduled to see urologist next week hopefully to have this  removed.  He is on tamsulosin 0.8 mg at night.  More recently his blood pressure medicines had been cut back however he still has low blood pressure.  Today's blood pressure was 102/62 at rest lying down and orthostatics were performed showing a drop to 84/62 when standing up.  Heart rate increased from 68-87.  This likely explains his symptoms.  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  He is very tall at 6 foot 4 inches with a thin frame.  He also has a pectus excavatum deformity which could also be associated with connective tissue disorder.  06/23/2021  [name] returns today for follow-up.  In the interim he saw Bettina Gavia, PA-C.  He is still been having issues with elevated blood pressures.  He has noted less lower blood pressures recently but has had his urinary catheter removed.  He remains on some tamsulosin at night.  She increase his amlodipine to 2.5 mg twice a day.  While this has been helpful blood pressures on his chart today indicates persistently elevated systolic and diastolic blood pressures.  Will likely need additional therapy.  He did have genetic testing in October however as blood was drawn the results of that are not yet available.  She said it may have been due to some issue with life insurance and she would reach out to the company to see what she needs to do.  I will reach out to talk with Dr. Jomarie Longs about results as well.  PMHx:  Past Medical History:  Diagnosis Date  Anxiety    Depression    Essential hypertension    Myocardial infarction [facility])    S/P aortic dissection repair 11/05/2020   Straight graft replacement of ascending aorta and proximal transverse aortic arch with re-suspension of native aortic valve and open hemi arch distal anastomosis with aorta to right common carotid bypass and aorta to right subclavian bypass   Stroke [facility])     Past Surgical History:  Procedure Laterality Date   ASCENDING AORTIC ROOT REPLACEMENT N/A 11/04/2020    Procedure: 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 WITH REPLACEMENT OF ASCENDING AORTA AND HEMIARCH USING HEMASHIELD PLATINUM GRAFT AND HEMASHIELD GOLD 14 X GRAFT, RESUSPENSION OF NATIVE VALVE, AORTA TO RIGHT CAROTID BYPASS, AORTA TO RIGHT SUBCLAVIAN BYPASS;  Surgeon: Purcell Nails, MD;  Location: [facility] OR;  Service: Open Heart Surgery;  Laterality: N/A;   TEE WITHOUT CARDIOVERSION N/A 11/04/2020   Procedure: TRANSESOPHAGEAL ECHOCARDIOGRAM (TEE);  Surgeon: Purcell Nails, MD;  Location[facility] OR;  Service: Open Heart Surgery;  Laterality: N/A;    FAMHx:  No family history on file.  No family history of dissection however his biologic son also has similar features as his father.  SOCHx:   reports that he quit smoking about 7 months ago. His smoking use included cigarettes. He has never used smokeless tobacco. He reports current alcohol use of about 2.0 standard drinks per week. He reports that he does not currently use drugs.  ALLERGIES:  No Known Allergies  ROS: Pertinent items noted in HPI and remainder of comprehensive ROS otherwise negative.  HOME MEDS: Current Outpatient Medications on File Prior to Visit  Medication Sig Dispense Refill   acetaminophen (TYLENOL) 325 MG tablet Take 1-2 tablets (325-650 mg total) by mouth every 4 (four) hours as needed for mild pain.     amLODipine (NORVASC) 5 MG tablet Take 0.5 tablets (2.5 mg total) by mouth in the morning and at bedtime. 90 tablet 3   aspirin 81 MG chewable tablet Chew 1 tablet (81 mg total) by mouth daily.     carvedilol (COREG) 3.125 MG tablet TAKE 1 TABLET BY MOUTH TWICE A DAY WITH MEALS 60 tablet 3   ferrous sulfate 325 (65 FE) MG tablet Take 1 tablet (325 mg total) by mouth daily. 30 tablet 3   gabapentin (NEURONTIN) 100 MG capsule TAKE 1 CAPSULE BY MOUTH TWICE A DAY 60 capsule 2   pantoprazole sodium (PROTONIX) 40 mg/20 mL PACK Dissolve 1 packet in 20 mLs (40 mg total) and take by mouth daily. 30 mL 0   QUEtiapine  (SEROQUEL) 50 MG tablet TAKE 1 TABLET BY MOUTH AT BEDTIME. AND 1/2 TABLET DURING THE DAY AS NEEDED FOR AGITATION 135 tablet 1   rosuvastatin (CRESTOR) 20 MG tablet Take 1 tablet (20 mg total) by mouth daily. 30 tablet 11   tamsulosin (FLOMAX) 0.4 MG CAPS capsule Take 2 capsules (0.8 mg total) by mouth daily after supper. 90 capsule 1   Zinc Sulfate 220 (50 Zn) MG TABS Take 1 tablet (220 mg total) by mouth daily. 90 tablet 0   mirtazapine (REMERON) 7.5 MG tablet Take 1 tablet (7.5 mg total) by mouth at bedtime. (Patient not taking: Reported on 06/23/2021) 30 tablet 0   polyethylene glycol (MIRALAX / GLYCOLAX) 17 g packet Take 17 g by mouth daily as needed for moderate constipation. (Patient not taking: Reported on 06/23/2021) 14 each 0   No current facility-administered medications on file prior to visit.    LABS/IMAGING:  No results found for this or any previous visit (from the past 48 hour(s)).  No results found.  LIPID PANEL:    Component Value Date/Time   CHOL 112 11/06/2020 0510   TRIG 114 11/15/2020 0348   HDL 24 (L) 11/06/2020 0510   CHOLHDL 4.7 11/06/2020 0510   VLDL 39 11/06/2020 0510   LDLCALC 49 11/06/2020 0510     WEIGHTS: Wt Readings from Last 3 Encounters:  06/23/21 150 lb 9.6 oz (68.3 kg)  05/20/21 148 lb (67.1 kg)  04/29/21 144 lb (65.3 kg)    VITALS: BP 136/90    Pulse 60    Ht 6\' 4"  (1.93 m)    Wt 150 lb 9.6 oz (68.3 kg)    SpO2 99%    BMI 18.33 kg/m   EXAM: Deferred  EKG: N/A  ASSESSMENT: Acute aortic dissection (Stanford A, DeBakey Type II), status post graft repair and right common carotid/right subclavian bypass (10/2020) 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   Mr. Dupler seems to be having less issues with orthostasis but does have significant  peripheral neuropathy.  They will be fitting compression stockings.  Blood pressure now running higher and he will likely need additional therapy.  His renal function appears to be fairly normal and would likely tolerate the addition of an ARB.  Would recommend losartan 50 mg nightly to avoid the morning lower blood pressures that seem to make him dizzy or symptomatic.  He should monitor blood pressures at home and contact [facility] with information.  Plan follow-up with me in 6 months or sooner as necessary  [place], MD, [facility], FACP  [place]   [facility]  Medical Director of the Advanced Lipid Disorders &  Cardiovascular Risk Reduction Clinic Diplomate of the American Board of Clinical Lipidology Attending Cardiologist  Direct Dial: [phone]   Fax: [phone]  Website:  [email or web address]   [phone] Chadrick Sprinkle 06/23/2021, 2:13 PM

## 2021-06-23 NOTE — Patient Instructions (Signed)
Medication Instructions:  START losartan 50mg  daily   *If you need a refill on your cardiac medications before your next appointment, please call your pharmacy*    Follow-Up: At [facility], you and your health needs are our priority.  As part of our continuing mission to provide you with exceptional heart care, we have created designated Provider Care Teams.  These Care Teams include your primary Cardiologist (physician) and Advanced Practice Providers (APPs -  Physician Assistants and Nurse Practitioners) who all work together to provide you with the care you need, when you need it.  We recommend signing up for the patient portal called "MyChart".  Sign up information is provided on this After Visit Summary.  MyChart is used to connect with patients for Virtual Visits (Telemedicine).  Patients are able to view lab/test results, encounter notes, upcoming appointments, etc.  Non-urgent messages can be sent to your provider as well.   To learn more about what you can do with MyChart, go to [email or web address].    Your next appointment:   6 month(s)  The format for your next appointment:   In Person  Provider:   Pixie Casino, MD {   Other Instructions Please check your BP at least once daily for about 2-3 weeks. Call or send readings via [place]: Rest 5 minutes before taking your blood pressure. Dont smoke or drink caffeinated beverages for at least 30 minutes before. Take your blood pressure before (not after) you eat. Sit comfortably with your back supported and both feet on the floor (dont cross your legs). Elevate your arm to heart level on a table or a desk. Use the proper sized cuff. It should fit smoothly and snugly around your bare upper arm. There should be enough room to slip a fingertip under the cuff. The bottom edge of the cuff should be 1 inch above the crease of the elbow. Ideally, take 3 measurements at one sitting and record  the average.

## 2021-06-24 ENCOUNTER — Ambulatory Visit: Payer: Medicaid Other | Admitting: Speech Pathology

## 2021-06-24 ENCOUNTER — Other Ambulatory Visit: Payer: Self-pay

## 2021-06-24 ENCOUNTER — Encounter: Payer: Self-pay | Admitting: Speech Pathology

## 2021-06-24 ENCOUNTER — Encounter: Payer: Self-pay | Admitting: Physical Therapy

## 2021-06-24 ENCOUNTER — Ambulatory Visit: Payer: Medicaid Other | Attending: Physician Assistant | Admitting: Physical Therapy

## 2021-06-24 VITALS — BP 140/90 | HR 72

## 2021-06-24 DIAGNOSIS — R2681 Unsteadiness on feet: Secondary | ICD-10-CM | POA: Diagnosis not present

## 2021-06-24 DIAGNOSIS — M6281 Muscle weakness (generalized): Secondary | ICD-10-CM | POA: Insufficient documentation

## 2021-06-24 DIAGNOSIS — R2689 Other abnormalities of gait and mobility: Secondary | ICD-10-CM | POA: Insufficient documentation

## 2021-06-24 DIAGNOSIS — R4701 Aphasia: Secondary | ICD-10-CM | POA: Insufficient documentation

## 2021-06-24 DIAGNOSIS — R482 Apraxia: Secondary | ICD-10-CM | POA: Insufficient documentation

## 2021-06-24 NOTE — Therapy (Signed)
[facility] [address] [place], [place], [address] Phone: [phone]   Fax:  [phone]  Speech Language Pathology Treatment  Patient Details  Name: Jesse [name] Jesse [name] [name] MRN: [id number] Date of Birth: 1976/02/08 Referring Provider (SLP): Dr. Dorna Mai   Encounter Date: 06/24/2021   End of Session - 06/24/21 1114     Visit Number 8    Number of Visits 15    Date for SLP Re-Evaluation 08/05/21    Authorization Type Medicaid  PT is requesting 6 visits, I am requesting 60, so that will leave him with 10 for the remainder of the year    Authorization - Visit Number 7    Authorization - Number of Visits 11    SLP Start Time 1015    SLP Stop Time  1100    SLP Time Calculation (min) 45 min    Activity Tolerance Patient tolerated treatment well             Past Medical History:  Diagnosis Date   Anxiety    Depression    Essential hypertension    Myocardial infarction ([place])    S/P aortic dissection repair 11/05/2020   Straight graft replacement of ascending aorta and proximal transverse aortic arch with re-suspension of native aortic valve and open hemi arch distal anastomosis with aorta to right common carotid bypass and aorta to right subclavian bypass   Stroke [facility])     Past Surgical History:  Procedure Laterality Date   ASCENDING AORTIC ROOT REPLACEMENT N/A 11/04/2020   Procedure: 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 WITH REPLACEMENT OF ASCENDING AORTA AND HEMIARCH USING HEMASHIELD PLATINUM 26MM GRAFT AND HEMASHIELD GOLD 14 X 8MM GRAFT, RESUSPENSION OF NATIVE VALVE, AORTA TO RIGHT CAROTID BYPASS, AORTA TO RIGHT SUBCLAVIAN BYPASS;  Surgeon: Rexene Alberts, MD;  Location: [facility];  Service: Open Heart Surgery;  Laterality: N/A;   TEE WITHOUT CARDIOVERSION N/A 11/04/2020   Procedure: TRANSESOPHAGEAL ECHOCARDIOGRAM (TEE);  Surgeon: Rexene Alberts, MD;  Location: [facility];  Service: Open Heart Surgery;   Laterality: N/A;    There were no vitals filed for this visit.   Subjective Assessment - 06/24/21 1020     Subjective "I'm frustrated with all of the doctor appointments"    Currently in Pain? Yes    Pain Score --   did not rate   Pain Location Foot    Pain Orientation Left    Pain Descriptors / Indicators Throbbing    Pain Type Neuropathic pain                   ADULT SLP TREATMENT - 06/24/21 1022       General Information   Behavior/Cognition Alert;Cooperative;Pleasant mood;Requires cueing      Treatment Provided   Treatment provided Cognitive-Linquistic      Cognitive-Linquistic Treatment   Treatment focused on Aphasia;Apraxia;Patient/family/caregiver education    Skilled Treatment Jesse [name] Jesse [name] [name] enters room with improved fluency explaining frustration that MD added another med.   Targeted word finding, verbal apraxia, sentence generation with Pharmacist, hospital ([facility]). For the verbs climb and slam, the patient generated 3 subject and objects for each verb, a total of 6 subjects and objects with usual min to mod verbal, questioning, written Jesse [name] [name] required. Pt generated 2 complex sentences, 1 for each verb, by answering 3 "wh" questions with usual mod A Jesse [name] [name], for a total of 2 complex sentences.  Targeted verbal apraxia and sentnece generation with repetition of 3 syllable  Jesse [name] [name] then putting word in a sentence. Jesse [name] Jesse [name] [name] to complete repetition of 8 Jesse [name] [name] and extended time and occasional min A to generate 8 simple sentences.      Assessment / Recommendations / Plan   Plan Continue with current plan of care      Progression Toward Goals   Progression toward goals Progressing toward goals              SLP Education - 06/24/21 1111     Education Details HW for verbal apraxia, Jesse [name] Jesse [name] [name] can say the word first and let Jesse [name] Jesse [name] [name] repeat it 3x    Person(s) Educated Patient;Spouse    Methods Explanation;Verbal  Jesse [name] [name];Demonstration    Comprehension Verbalized understanding;Returned demonstration;Verbal Jesse [name] [name] required              SLP Short Term Goals - 06/24/21 1113       SLP SHORT TERM GOAL #1   Status Achieved      SLP SHORT TERM GOAL #2   Title Pt will ID and self correct paraphasias in structured task 8/10 with occasional min A    Baseline 50% ID errors on eval    Time 1    Period Weeks    Status Achieved      SLP SHORT TERM GOAL #3   Title Pt will increase utterance legnth to 5-7 Jesse [name] [name] with occasional min A    Baseline 3-4 Jesse [name] [name]    Time 1    Period Weeks    Status Achieved      SLP SHORT TERM GOAL #4   Title Pt will use Jesse [name] [name] for aphasia 75% of anomic episodes    Baseline 50%; 06-15-21    Time 1    Period Weeks    Status Achieved              SLP Long Term Goals - 06/24/21 1113       SLP LONG TERM GOAL #1   Title In structured task, such as [facility], pt will generate complex sentnece with occasional min A and extended time    Baseline simple phrases only on eval; 06/24/21    Time 8    Period Weeks    Status On-going      SLP LONG TERM GOAL #2   Title pt will functionally communicate in 5 minutes simple conversation using Jesse [name] [name] in 2 sessions    Baseline 2 minutes; 06/22/21    Time 8    Period Weeks    Status On-going      SLP LONG TERM GOAL #3   Title Spouse will use 2 strategies to A pt with auditory comprehension 3x    Baseline not demonstrated    Time 8    Period Weeks    Status On-going      SLP LONG TERM GOAL #4   Title Pt will write personal information and family names with occasional min A    Baseline his name only    Time 8    Period Weeks    Status On-going      SLP LONG TERM GOAL #5   Title Pt/spouse will improve score on Communication Participation Item Bank by 2 points    Baseline score of 9    Time 8    Period Weeks    Status On-going              Plan - 06/24/21 1112     Clinical Impression Statement Improving  mild to moderate aphasia with improved fluency and word finding today. Jesse [name] Jesse [name] [name] (verbal and non verbal) for aphasia in conversation with min A in [place] also reports improved use of Jesse [name] [name] at home. With persoanlly relevant categories, Jesse [name] Jesse [name] [name] with rare min A. Jesse [name] Jesse [name] [name] and Jesse [name] Jesse [name] [name] feel improvement is from being with his brother and father and working on a classic truck which he has not done since CVA. Affect much improved. Continue skilled ST to maximize communication for safety, independence, QOL and reduced frustration    Speech Therapy Frequency 2x / week   1x a week for 3 weeks, 2x a week for 4 weeks   Duration 8 weeks    Treatment/Interventions Compensatory techniques;Functional tasks;Multimodal communcation approach;Cueing hierarchy;SLP instruction and feedback;Language facilitation;Cognitive reorganization;Compensatory strategies;Internal/external aids;Patient/family education    Potential to Achieve Goals Good    Potential Considerations Financial resources             Patient will benefit from skilled therapeutic intervention in order to improve the following deficits and impairments:   Aphasia  Verbal apraxia    Problem List Patient Active Problem List   Diagnosis Date Noted   Altered sensation due to recent stroke 02/19/2021   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   AKI (acute kidney injury) ([place])    Benign essential HTN    Hyponatremia    Acute blood loss anemia    Urinary retention    Pressure injury of skin 11/17/2020   Respiratory arrest ([place]) 11/14/2020   Acute respiratory failure with hypoxia ([place]) 11/14/2020   Adverse effects of medication 11/14/2020   Hypotension 11/14/2020   Encounter for central line placement    Aortic dissection ([place]) 11/13/2020   Acute ischemic right MCA stroke ([place]) 11/13/2020   Agitation 11/13/2020   Atelectasis    Phlebitis    Polysubstance abuse ([place])    Elevated troponin 11/07/2020   New onset left  bundle branch block (LBBB) 11/07/2020   Protein-calorie malnutrition, severe 11/07/2020   Encephalopathy acute    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 [place], [place], [address] Phone: [phone]   Fax:  [phone]   Name: Jesse [name] Jesse [name] [name] MRN: [id number] Date of Birth: Aug 28, 1975

## 2021-06-24 NOTE — Therapy (Signed)
[facility] [address] [place], [place], [address] Phone: [phone]   Fax:  [phone]  Physical Therapy Treatment  Patient Details  Name: Jesse [name] MRN: [id number] Date of Birth: 1975/08/27 Referring Provider (PT): Georganna SkeansAmelia Wilson   Encounter Date: 06/24/2021   PT End of Session - 06/24/21 1252     Visit Number 3    Number of Visits 9    Date for PT Re-Evaluation 07/10/21    Authorization Type Medicaid [employer]    PT Start Time 1106    PT Stop Time 1145    PT Time Calculation (min) 39 min    Equipment Utilized During Treatment Gait belt    Activity Tolerance Patient tolerated treatment well             Past Medical History:  Diagnosis Date   Anxiety    Depression    Essential hypertension    Myocardial infarction (HCC)    S/P aortic dissection repair 11/05/2020   Straight graft replacement of ascending aorta and proximal transverse aortic arch with re-suspension of native aortic valve and open hemi arch distal anastomosis with aorta to right common carotid bypass and aorta to right subclavian bypass   Stroke [facility])     Past Surgical History:  Procedure Laterality Date   ASCENDING AORTIC ROOT REPLACEMENT N/A 11/04/2020   Procedure: 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 WITH REPLACEMENT OF ASCENDING AORTA AND HEMIARCH USING HEMASHIELD PLATINUM 26MM GRAFT AND HEMASHIELD GOLD 14 X 8MM GRAFT, RESUSPENSION OF NATIVE VALVE, AORTA TO RIGHT CAROTID BYPASS, AORTA TO RIGHT SUBCLAVIAN BYPASS;  Surgeon: Purcell Nailswen, Clarence H, MD;  Location: [facility];  Service: Open Heart Surgery;  Laterality: N/A;   TEE WITHOUT CARDIOVERSION N/A 11/04/2020   Procedure: TRANSESOPHAGEAL ECHOCARDIOGRAM (TEE);  Surgeon: Purcell Nailswen, Clarence H, MD;  [facility];  Service: Open Heart Surgery;  Laterality: N/A;    Vitals:   06/24/21 1118  BP: 140/90  Pulse: 72     Subjective Assessment - 06/24/21 1111     Subjective Pt saw  cardiologist 06/23/2021 and they increased amlodipine to 2.5mg  twice a day and added Losartan Potassium 50mg  once a day to control BP.  Wife states she is picking up the Losartan today so pt has not taken first dose.    Patient is accompained by: Family member   wife, Almira CoasterGina   Pertinent History Pt admitted 11/04/20 with acute onset aphasia, blurry vision. CTA revealed acute aortic dissection. Pt with worsening o2 requirements requiring intubation 7/15, self extubated 7/16. 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 Transferred to CIR 7/21-7/22 and returned to acute hospital after respiratory failure with intubation x 24 hours.   PMH: Anxiety, Depression, Essential hypertension.    Patient Stated Goals Pt wants to be like I used to be and improve balance.    Currently in Pain? Yes    Pain Score --   did not rate   Pain Location Foot    Pain Orientation Left    Pain Descriptors / Indicators Throbbing    Pain Type Neuropathic pain    Pain Onset More than a month ago    Pain Frequency Constant                [facility] PT Assessment - 06/24/21 1123       Functional Gait  Assessment   Gait assessed  Yes    Gait Level  Surface Walks 20 ft in less than 7 sec but greater than 5.5 sec, uses assistive device, slower speed, mild gait deviations, or deviates 6-10 in outside of the 12 in walkway width.    Change in Gait Speed Able to smoothly change walking speed without loss of balance or gait deviation. Deviate no more than 6 in outside of the 12 in walkway width.    Gait with Horizontal Head Turns Performs head turns smoothly with slight change in gait velocity (eg, minor disruption to smooth gait path), deviates 6-10 in outside 12 in walkway width, or uses an assistive device.    Gait with Vertical Head Turns Performs head turns with no change in gait. Deviates no more than 6 in outside 12 in walkway  width.    Gait and Pivot Turn Pivot turns safely in greater than 3 sec and stops with no loss of balance, or pivot turns safely within 3 sec and stops with mild imbalance, requires small steps to catch balance.    Step Over Obstacle Is able to step over one shoe box (4.5 in total height) without changing gait speed. No evidence of imbalance.    Gait with Narrow Base of Support Ambulates 4-7 steps.    Gait with Eyes Closed Walks 20 ft, uses assistive device, slower speed, mild gait deviations, deviates 6-10 in outside 12 in walkway width. Ambulates 20 ft in less than 9 sec but greater than 7 sec.    Ambulating Backwards Walks 20 ft, uses assistive device, slower speed, mild gait deviations, deviates 6-10 in outside 12 in walkway width.    Steps Alternating feet, no rail.   close S   Total Score 22                           [facility] Adult PT Treatment/Exercise - 06/24/21 1123       Transfers   Transfers Sit to Stand;Stand to Sit    Sit to Stand 7: Independent    Stand to Sit 7: Independent    Comments 30 sec sit to stand test = 12 reps without hands from standard chair height      Ambulation/Gait   Ambulation/Gait Yes    Ambulation/Gait Assistance 5: Supervision    Ambulation/Gait Assistance Details During FGA reassessment, around gym>mat    Ambulation Distance (Feet) 150 Feet    Assistive device None    Gait Pattern Step-through pattern    Ambulation Surface Level;Indoor    Stairs Yes    Stairs Assistance 5: Supervision    Stair Management Technique Alternating pattern;No rails;Forwards    Number of Stairs 4    Height of Stairs 6      Self-Care   Self-Care Other Self-Care Comments    Other Self-Care Comments  PT reassessed BP frequently throughout session allowing 2-3 min rest between blocks of activity to allow BP to decrease to [facility] before proceeding.  See vitals section for BP at onset of session.  Following 30 sec chair stand and 4 components of FGA BP was 148/102.   Rest 3 mins.  Rechecked BP was 144/94.  Resumed FGA.  When FGA completed BP was assessed at end of session at 142/100.                     PT Education - 06/24/21 1250     Education Details Edu on BP response to exercise, compliance to medications and BP log as recommended  by cardiologist, symptoms to monitor as well as BP systolic and diastolic limits for exercise to promote safety with HEP.    Person(s) Educated Patient;Spouse    Methods Explanation    Comprehension Verbalized understanding              PT Short Term Goals - 06/24/21 1304       PT SHORT TERM GOAL #1   Title Pt will perform HEP with family supervision for improved strength, balance, and gait.    Baseline need to add to current activities; 06/24/2021 pt states he has not yet done all the exercises, only attempted braiding.    Time 4    Period Weeks    Status On-going    Target Date 06/10/21      PT SHORT TERM GOAL #2   Title Pt will increase 30 sec sit to stand from 9 reps to >11 reps for improved functional strength.    Baseline 05/13/21 9 reps without hands from chair; 06/24/2021 12 reps without hands from standard chair height    Time 4    Period Weeks    Status Achieved    Target Date 06/10/21      PT SHORT TERM GOAL #3   Title Pt will increase FGA from 20/30 to >22/30 for improved balance and gait safety.    Baseline 05/13/21 20/30; 06/24/2021 22/30    Time 4    Period Weeks    Status Achieved    Target Date 06/10/21               PT Long Term Goals - 05/13/21 1934       PT LONG TERM GOAL #1   Title Pt will increase FGA from 20/30 to >24/30 for improved balance and gait safety.    Baseline 05/13/21 20/30    Time 8    Period Weeks    Status New    Target Date 07/10/21      PT LONG TERM GOAL #2   Title Pt will increase gait speed form 0.39m/s to >1.95m/s for improved community mobility.    Baseline 05/13/21 0.72m/s    Time 8    Period Weeks    Status New    Target Date 07/10/21       PT LONG TERM GOAL #3   Title Pt will increase left SLS to 5 sec or better for improved standing balance.    Baseline 05/13/21 1 sec    Time 8    Period Weeks    Status New    Target Date 07/10/21      PT LONG TERM GOAL #4   Title Pt will ambulate >1000' on varied surfaces without AD independently scanning environment without LOB for improved community mobility.    Baseline 200' supervision    Time 8    Period Weeks    Status New    Target Date 07/10/21                   Plan - 06/24/21 1254     Clinical Impression Statement BP monitored throughout session with diastolic continuing to rise above 100 with light exercise.  Pt is to start Losartan in addition to current medication regimen tomorrow per wife.  Today's focus was on reassessing short term goals with patient meeting 2 of 3 with FGA improving to 22/30 and 30sec chair stand improving to 12 reps.  Pt would benefit from continued PT to progress towards LTGs.  Personal Factors and Comorbidities Comorbidity 3+;Time since onset of injury/illness/exacerbation    Comorbidities Anxiety, Depression, Essential hypertension. 1    Examination-Activity Limitations Stairs;Locomotion Level;Transfers    Examination-Participation Restrictions Occupation;Community Activity;Driving;Interpersonal Relationship    Stability/Clinical Decision Making Evolving/Moderate complexity    Rehab Potential Good    PT Frequency 1x / week   plus eval   PT Duration 8 weeks    PT Treatment/Interventions ADLs/Self Care Home Management;DME Instruction;Gait training;Stair training;Functional mobility training;Therapeutic activities;Therapeutic exercise;Balance training;Neuromuscular re-education;Passive range of motion;Patient/family education;Orthotic Fit/Training;Vestibular    PT Next Visit Plan Monitor vitals.  High level balance training- gait with horizontal head turns, tandem walking, walking on compliant surfaces, SLS activities.    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   AKI (acute kidney injury) (HCC)    Benign essential HTN    Hyponatremia    Acute blood loss anemia    Urinary retention    Pressure injury of skin 11/17/2020   Respiratory arrest (HCC) 11/14/2020   Acute respiratory failure with hypoxia (HCC) 11/14/2020   Adverse effects of medication 11/14/2020   Hypotension 11/14/2020   Encounter for central line placement    Aortic dissection (HCC) 11/13/2020   Acute ischemic right MCA stroke (HCC) 11/13/2020   Agitation 11/13/2020   Atelectasis    Phlebitis    Polysubstance abuse (HCC)    Elevated troponin 11/07/2020   New onset left bundle branch block (LBBB) 11/07/2020   Protein-calorie malnutrition, severe 11/07/2020   Encephalopathy acute    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

## 2021-06-25 ENCOUNTER — Encounter: Payer: Medicaid Other | Attending: Psychology | Admitting: Psychology

## 2021-06-25 DIAGNOSIS — R200 Anesthesia of skin: Secondary | ICD-10-CM | POA: Insufficient documentation

## 2021-06-25 DIAGNOSIS — I69398 Other sequelae of cerebral infarction: Secondary | ICD-10-CM | POA: Insufficient documentation

## 2021-06-25 DIAGNOSIS — R202 Paresthesia of skin: Secondary | ICD-10-CM | POA: Insufficient documentation

## 2021-06-25 DIAGNOSIS — R4701 Aphasia: Secondary | ICD-10-CM | POA: Insufficient documentation

## 2021-06-25 DIAGNOSIS — R209 Unspecified disturbances of skin sensation: Secondary | ICD-10-CM | POA: Insufficient documentation

## 2021-06-26 ENCOUNTER — Other Ambulatory Visit: Payer: Self-pay

## 2021-06-26 ENCOUNTER — Encounter: Payer: Self-pay | Admitting: Family Medicine

## 2021-06-26 ENCOUNTER — Encounter: Payer: Self-pay | Admitting: Physical Medicine & Rehabilitation

## 2021-06-26 ENCOUNTER — Encounter: Payer: Medicaid Other | Admitting: Physical Medicine & Rehabilitation

## 2021-06-26 VITALS — BP 149/109 | HR 74 | Ht 76.0 in | Wt 149.0 lb

## 2021-06-26 DIAGNOSIS — I69398 Other sequelae of cerebral infarction: Secondary | ICD-10-CM

## 2021-06-26 DIAGNOSIS — R209 Unspecified disturbances of skin sensation: Secondary | ICD-10-CM | POA: Diagnosis not present

## 2021-06-26 DIAGNOSIS — R202 Paresthesia of skin: Secondary | ICD-10-CM | POA: Diagnosis not present

## 2021-06-26 DIAGNOSIS — R4701 Aphasia: Secondary | ICD-10-CM

## 2021-06-26 DIAGNOSIS — R200 Anesthesia of skin: Secondary | ICD-10-CM

## 2021-06-26 MED ORDER — GABAPENTIN 300 MG PO CAPS: 300.0000 mg | ORAL_CAPSULE | Freq: Three times a day (TID) | ORAL | 1 refills | Status: DC

## 2021-06-26 NOTE — Progress Notes (Signed)
? ?Subjective:  ? ? Patient ID: Jesse [name], male    DOB: February 12, 1976, 46 y.o.   MRN: [id number] ?46 y.o. male with history of HTN, depression, anxiety originally admitted to [facility] on 11/04/2020 with aphasia, blurry vision right and mental status changes due to aortic dissection requiring emergent repair by Dr. Roxy Manns on the same day.  Follow-up CT head showed relatively large infarct with cytotoxic edema in posterior right MCA and MCA/PCA watershed territories with development of petechial hemorrhage. He was started on low-dose ASA for stroke prophylaxis.  He was noted to have significant deficits in mobility as well as aphasia with apraxia and was transferred to CIR on 07/21 for intensive rehab program.  On 07/22 he was noted to be hypotensive and tachycardic with agonal breathing requiring CODE BLUE and brief intubation with low-dose levo.   ?  ?Arrest to be due to aspiration pneumonia as well as COVID-positive state and he was treated with antibiotics, remdesivir, steroids as well as baricitinib.  He tolerated extubation by 07/23 and repeat CT of the head showed evolving right MCA territory infarct with decreasing edema.  He was noted to have poor p.o. intake therefore core track was placed for nutritional support.  He continued to have issues with poor attention, expressive/receptive deficits with cognitive deficits, unsteady/staggering gait as well as difficulty completing ADLs.  CIR was recommended due to functional decline. ?Admit date: 11/27/2020 ?Discharge date: 12/11/2020 ?HPI ?Patient living with wife who is providing help for him at home.  She has concerns about his left foot pain, at last visit in October he was complaining of some tingling pain but appears to have caused more problems with mobility over time.  He was started on low-dose gabapentin 100 mg twice daily and then increase to 3 times daily but this has not been of much benefit thus far. ?Also notes some pain or numbness in the right hand as well.   He has had prior right wrist injury as a child but nothing recent ? ?Pain Inventory ?Average Pain 8 ?Pain Right Now 8 ?My pain is constant, sharp, burning, dull, stabbing, tingling, and aching ? ?LOCATION OF PAIN  both hands, both feet, back pain ? ?BOWEL ?Number of stools per week: 3 ?Oral laxative use No  ? ? ?BLADDER ?Normal ? ?Mobility ?use a cane ?ability to climb steps?  yes ?do you drive?  no ?Do you have any goals in this area?  yes ? ?Function ?disabled: date disabled in progress ?I need assistance with the following:  meal prep, household duties, and shopping ?Do you have any goals in this area?  yes ? ?Neuro/Psych ?weakness ?numbness ?tremor ?tingling ?trouble walking ?spasms ?dizziness ?confusion ?depression ?anxiety ? ?Prior Studies ?Any changes since last visit?  no ? ?Physicians involved in your care ?Any changes since last visit?  no ? ? ?History reviewed. No pertinent family history. ?Social History  ? ?Socioeconomic History  ? Marital status: Married  ?  Spouse name: Not on file  ? Number of children: Not on file  ? Years of education: Not on file  ? Highest education level: 9th grade  ?Occupational History  ? Not on file  ?Tobacco Use  ? Smoking status: Former  ?  Types: Cigarettes  ?  Quit date: 11/04/2020  ?  Years since quitting: 0.6  ? Smokeless tobacco: Never  ?Vaping Use  ? Vaping Use: Never used  ?Substance and Sexual Activity  ? Alcohol use: Yes  ?  Alcohol/week: 2.0 standard drinks  ?  Types: 2 Cans of beer per week  ? Drug use: Not Currently  ? Sexual activity: Yes  ?Other Topics Concern  ? Not on file  ?Social History Narrative  ? Not on file  ? ?Social Determinants of Health  ? ?Financial Resource Strain: Not on file  ?Food Insecurity: Not on file  ?Transportation Needs: Not on file  ?Physical Activity: Not on file  ?Stress: Not on file  ?Social Connections: Not on file  ? ?Past Surgical History:  ?Procedure Laterality Date  ? ASCENDING AORTIC ROOT REPLACEMENT N/A 11/04/2020  ?  Procedure: 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 WITH REPLACEMENT OF ASCENDING AORTA AND HEMIARCH USING HEMASHIELD PLATINUM 26MM GRAFT AND HEMASHIELD GOLD 14 X 8MM GRAFT, RESUSPENSION OF NATIVE VALVE, AORTA TO RIGHT CAROTID BYPASS, AORTA TO RIGHT SUBCLAVIAN BYPASS;  Surgeon: Rexene Alberts, MD;  Location: [place];  Service: Open Heart Surgery;  Laterality: N/A;  ? TEE WITHOUT CARDIOVERSION N/A 11/04/2020  ? Procedure: TRANSESOPHAGEAL ECHOCARDIOGRAM (TEE);  Surgeon: Rexene Alberts, MD;  Location: [place];  Service: Open Heart Surgery;  Laterality: N/A;  ? ?Past Medical History:  ?Diagnosis Date  ? Anxiety   ? Depression   ? Essential hypertension   ? Myocardial infarction [facility])   ? 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 Straight graft replacement of ascending aorta and proximal transverse aortic arch with re-suspension of native aortic valve and open hemi arch distal anastomosis with aorta to right common carotid bypass and aorta to right subclavian bypass  ? Stroke [facility])   ? ?Pulse 74   Ht 6\' 4"  (1.93 m)   Wt 149 lb (67.6 kg)   SpO2 99%   BMI 18.14 kg/m?  ? ?Opioid Risk Score:   ?Fall Risk Score:  `1 ? ?Depression screen PHQ 2/9 ? ?Depression screen [facility] 2/9 04/29/2021 02/19/2021 01/27/2021 12/30/2020  ?Decreased Interest 1 1 1 1   ?Down, Depressed, Hopeless 1 1 2 2   ?PHQ - 2 Score 2 2 3 3   ?Altered sleeping - - 2 2  ?Tired, decreased energy 1 - 2 2  ?Change in appetite 0 - 1 1  ?Feeling bad or failure about yourself  1 - 2 2  ?Trouble concentrating 1 - 2 2  ?Moving slowly or fidgety/restless 1 - 0 2  ?Suicidal thoughts 0 - 1 1  ?PHQ-9 Score - - 13 15  ?Difficult doing work/chores Not difficult at all - - Somewhat difficult  ? ? ?Review of Systems  ?Musculoskeletal:  Negative for gait problem.  ?     Pain in both feet & hands ?spasms  ?Neurological:  Positive for tremors, weakness and numbness.  ?Psychiatric/Behavioral:  Positive for confusion.   ?     Depression, anxiety  ?All other systems reviewed and are  negative. ? ?   ?Objective:  ? Physical Exam ?Skin: ?   General: Skin is warm and dry.  ?Neurological:  ?   Mental Status: He is oriented to person, place, and time.  ?   Comments: Sensation difficult to evaluate secondary to severe aphasia.  The patient does state his fingers feel numb on the right side but not on the left side to pinprick and light touch also he has decreased sensation in the foot the left foot and normal sensation right foot. ? ?He is able to follow simple commands verbally.  He does need some gestural cues for two-step commands. ?He is fluent but has paraphasic errors  ?Psychiatric:     ?  Mood and Affect: Mood normal.     ?   Behavior: Behavior normal.  ? ?Motor strength is 5/5 bilateral deltoid, bicep, tricep, grip, hip flexor, knee extensor, 5/5 bilateral ankle dorsiflexion plantarflexion ?Ambulates without assistive device no evidence of toe drag and instability ? ? ? ?   ?Assessment & Plan:  ?1.  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  His left lower extremity pain is likely dysesthetic central pain.  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 ?We will check EMG/NCV bilateral extremities ?Increase gabapentin to 300 mg 3 times daily ?Long discussion with patient and wife about deficits current pain issues ?Complicated situation given his severe aphasia comprehension as well as expression of language. ? ?

## 2021-06-29 ENCOUNTER — Ambulatory Visit: Payer: Medicaid Other | Admitting: Speech Pathology

## 2021-06-29 ENCOUNTER — Encounter: Payer: Self-pay | Admitting: Speech Pathology

## 2021-06-29 ENCOUNTER — Other Ambulatory Visit: Payer: Self-pay | Admitting: Thoracic Surgery (Cardiothoracic Vascular Surgery)

## 2021-06-29 ENCOUNTER — Other Ambulatory Visit: Payer: Self-pay

## 2021-06-29 DIAGNOSIS — R482 Apraxia: Secondary | ICD-10-CM

## 2021-06-29 DIAGNOSIS — R2681 Unsteadiness on feet: Secondary | ICD-10-CM | POA: Diagnosis not present

## 2021-06-29 DIAGNOSIS — R4701 Aphasia: Secondary | ICD-10-CM

## 2021-06-29 DIAGNOSIS — I7101 Dissection of ascending aorta: Secondary | ICD-10-CM

## 2021-06-29 NOTE — Therapy (Signed)
[place] [facility] ?[address] ?[place], [place], [address] ?Phone: [phone]   Fax:  [phone] ? ?Speech Language Pathology Treatment ? ?Patient Details  ?Name: Jesse [name] ?MRN: [id number] ?Date of Birth: 05/28/1975 ?Referring Provider (SLP): Dr. Georganna Skeans ? ? ?Encounter Date: 06/29/2021 ? ? End of Session - 06/29/21 1206   ? ? Visit Number 9   ? Number of Visits 15   ? Date for SLP Re-Evaluation 08/05/21   ? Authorization - Visit Number 8   ? Authorization - Number of Visits 11   ? SLP Start Time 1100   ? SLP Stop Time  1145   ? SLP Time Calculation (min) 45 min   ? ?  ?  ? ?  ? ? ?Past Medical History:  ?Diagnosis Date  ? Anxiety   ? Depression   ? Essential hypertension   ? Myocardial infarction [facility])   ? 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 Straight graft replacement of ascending aorta and proximal transverse aortic arch with re-suspension of native aortic valve and open hemi arch distal anastomosis with aorta to right common carotid bypass and aorta to right subclavian bypass  ? Stroke [facility])   ? ? ?Past Surgical History:  ?Procedure Laterality Date  ? ASCENDING AORTIC ROOT REPLACEMENT N/A 11/04/2020  ? Procedure: 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 WITH REPLACEMENT OF ASCENDING AORTA AND HEMIARCH USING HEMASHIELD PLATINUM GRAFT AND HEMASHIELD GOLD 14 X GRAFT, RESUSPENSION OF NATIVE VALVE, AORTA TO RIGHT CAROTID BYPASS, AORTA TO RIGHT SUBCLAVIAN BYPASS;  Surgeon: Purcell Nails, MD;  Location[facility];  Service: Open Heart Surgery;  Laterality: N/A;  ? TEE WITHOUT CARDIOVERSION N/A 11/04/2020  ? Procedure: TRANSESOPHAGEAL ECHOCARDIOGRAM (TEE);  Surgeon: Purcell Nails, MD;  Location: [facility] OR;  Service: Open Heart Surgery;  Laterality: N/A;  ? ? ?There were no vitals filed for this visit. ? ? Subjective Assessment - 06/29/21 1103   ? ? Subjective "It was frustrating this weekend"   ? Currently in Pain? Yes   ? Pain Score --    not rated  ? Pain Location Foot   ? Pain Orientation Right;Left   ? ?  ?  ? ?  ? ? ? ? ? ? ? ? ADULT SLP TREATMENT - 06/29/21 1104   ? ?  ? General Information  ? Behavior/Cognition Cooperative;Pleasant mood;Requires cueing   ?  ? Treatment Provided  ? Treatment provided Cognitive-Linquistic   ?  ? Cognitive-Linquistic Treatment  ? Treatment focused on Aphasia;Apraxia;Patient/family/caregiver education   ? Skilled Treatment  ? ?Targeted word finding, verbal apraxia, sentence generation with Scientist, product/process development (VNeST). For the verbs pour and plant, the patient generated 3 subject and objects for each verb, a total of 6 subjects and objects with usual mod verbal, questioning, written cues required. Pt generated 2 complex sentences, 1 for each verb, by answering "wh" questions with usual mod cues, for a total of 2 complex sentences.  ?pour and plant - Targeted verbal apraxia generating sentences with multisyllabic words 6/6 words with extended time and frequent mod quesitoning and semantic cues. Written expression at phrase level with copying cues and extended time reduced diffculty to word level with usual mod A. Written HW provided   ?  ? Assessment / Recommendations / Plan  ? Plan Continue with current plan of care   ?  ? Progression Toward Goals  ? Progression toward goals Progressing toward goals   ? ?  ?  ? ?  ? ? ? ? ?  SLP Short Term Goals - 06/29/21 1205   ? ?  ? SLP SHORT TERM GOAL #1  ? Status Achieved   ?  ? SLP SHORT TERM GOAL #2  ? Title Pt will ID and self correct paraphasias in structured task 8/10 with occasional min A   ? Baseline 50% ID errors on eval   ? Time 1   ? Period Weeks   ? Status Achieved   ?  ? SLP SHORT TERM GOAL #3  ? Title Pt will increase utterance legnth to 5-7 words with occasional min A   ? Baseline 3-4 words   ? Time 1   ? Period Weeks   ? Status Achieved   ?  ? SLP SHORT TERM GOAL #4  ? Title Pt will use compensations for aphasia 75% of anomic episodes   ? Baseline  50%; 06-15-21   ? Time 1   ? Period Weeks   ? Status Achieved   ? ?  ?  ? ?  ? ? ? SLP Long Term Goals - 06/29/21 1205   ? ?  ? SLP LONG TERM GOAL #1  ? Title In structured task, such as VNeST, pt will generate complex sentnece with occasional min A and extended time   ? Baseline simple phrases only on eval; 06/24/21   ? Time 7   ? Period Weeks   ? Status On-going   ?  ? SLP LONG TERM GOAL #2  ? Title pt will functionally communicate in 5 minutes simple conversation using compensations in 2 sessions   ? Baseline 2 minutes; 06/22/21   ? Time 7   ? Period Weeks   ? Status On-going   ?  ? SLP LONG TERM GOAL #3  ? Title Spouse will use 2 strategies to A pt with auditory comprehension 3x   ? Baseline not demonstrated   ? Time 7   ? Period Weeks   ? Status On-going   ?  ? SLP LONG TERM GOAL #4  ? Title Pt will write personal information and family names with occasional min A   ? Baseline his name only   ? Time 7   ? Period Weeks   ? Status On-going   ?  ? SLP LONG TERM GOAL #5  ? Title Pt/spouse will improve score on Communication Participation Item Bank by 2 points   ? Baseline score of 9   ? Time 7   ? Period Weeks   ? Status On-going   ? ?  ?  ? ?  ? ? ? Plan - 06/29/21 1159   ? ? Clinical Impression Statement Ongoing moderate apahsia affecting [name]'s communication with family. He is reporting increased frustration due to aphasia as well as visual changes and pain in his feet. Discussion about how mood, stress and frustration affect speech and language. Ongoing skilled ST recommended to improve communication for safety, indepenedence, QOL and reduce caregiver burden   ? Speech Therapy Frequency 2x / week   1x a week for 3 weeks, 2x a week for 4 weeks  ? Duration 8 weeks   ? Treatment/Interventions Compensatory techniques;Functional tasks;Multimodal communcation approach;Cueing hierarchy;SLP instruction and feedback;Language facilitation;Cognitive reorganization;Compensatory strategies;Internal/external aids;Patient/family  education   ? Potential to Achieve Goals Good   ? Potential Considerations Financial resources   ? ?  ?  ? ?  ? ? ?Patient will benefit from skilled therapeutic intervention in order to improve the following deficits and impairments:   ?Aphasia ? ?  Verbal apraxia ? ? ? ?Problem List ?Patient Active Problem List  ? Diagnosis Date Noted  ? Altered sensation due to recent stroke 02/19/2021  ? 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 AKI (acute kidney injury) (HCC)   ? Benign essential HTN   ? Hyponatremia   ? Acute blood loss anemia   ? Urinary retention   ? Pressure injury of skin 11/17/2020  ? Respiratory arrest (HCC) 11/14/2020  ? Acute respiratory failure with hypoxia (HCC) 11/14/2020  ? Adverse effects of medication 11/14/2020  ? Hypotension 11/14/2020  ? Encounter for central line placement   ? Aortic dissection (HCC) 11/13/2020  ? Acute ischemic right MCA stroke (HCC) 11/13/2020  ? Agitation 11/13/2020  ? Atelectasis   ? Phlebitis   ? Polysubstance abuse (HCC)   ? Elevated troponin 11/07/2020  ? New onset left bundle branch block (LBBB) 11/07/2020  ? Protein-calorie malnutrition, severe 11/07/2020  ? Encephalopathy acute   ? 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 Acute hypoxemic respiratory failure (HCC)   ? Acute thoracic aortic dissection 11/05/2020  ? 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 Cerebral embolism with cerebral infarction 11/05/2020  ? Essential hypertension   ? ? ?Lucien Budney, Radene Journey, CCC-SLP ?06/29/2021, 12:08 PM ? ?[place] [facility] ?[address] ?[place], [place], [address] ?Phone: [phone]   Fax:  [phone] ? ? ?Name: Daray Polgar ?MRN: [id number] ?Date of Birth: October 10, 1975 ? ?

## 2021-06-30 NOTE — Progress Notes (Deleted)
?Cardiology Office Note:   ? ?Date:  06/30/2021  ? ?ID:  Jesse [name], DOB 04-19-1976, MRN [id number] ? ?PCP:  Dorna Mai, MD ?  ?[facility] Providers ?Cardiologist:  Pixie Casino, MD { ?Click to update primary MD,subspecialty MD or APP then REFRESH:1}   ? ?Referring MD: Dorna Mai, MD  ? ?No chief complaint on file. ?*** ? ?History of Present Illness:   ? ?Jesse [name] is a 46 y.o. 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  He underwent emergent Hemi arch repair with a deep branching Y graft to the right carotid and subclavian arteries on 11/04/2020.  He had residual paresthesias in his right hand and his feet.  He was working with therapy for this.  He established care with Dr. Debara Pickett on 12/25/2020.  He had had episodes of hypotension and dizziness.  He was seen in urgent care for this and found to have a UTI.  Antihypertensives have been reduced: Hydralazine was discontinued and amlodipine was reduced to 2.5 mg.  He was also on 0.8 mg tamsulosin for chronic urinary retention.  Dr. Debara Pickett felt he had clinical features consistent with connective tissue disorder and referred him to genetics clinic for testing of aortopathy's.  I saw him in follow-up 04/08/2021.  He had seen a genetics counselor but blood testing was pending out of concern for establishing life insurance and disability for the patient and their 3 children.  His blood pressures were not controlled [id number] to [id number] systolic.  In September he was having orthostatic hypotension medications were reduced.  He was taking 2.5 mg amlodipine and 3.125 mg Coreg twice daily when I saw him.  I increased amlodipine to 2.5 mg twice daily.  He saw Dr. Debara Pickett in clinic on 06/23/2021 with persistent hypertension.  Dr. Debara Pickett added 50 mg losartan at night. ? ?He presents today for scheduled follow-up. ? ? ? ? ?Right hemispheric stroke with aphasia ?Type II aortic dissection ?- Repair was successful and he discharged to inpatient rehab ?-He  continues to receive therapy services. ? ? ?Positional dizziness ?Orthostatic hypotension ?Hypertension ? ? ?Urinary retention with recent UTI and Foley catheter in place ? ? 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 ? ? ? ? ? ? ?Past Medical History:  ?Diagnosis Date  ? Anxiety   ? Depression   ? Essential hypertension   ? Myocardial infarction [facility])   ? 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 Straight graft replacement of ascending aorta and proximal transverse aortic arch with re-suspension of native aortic valve and open hemi arch distal anastomosis with aorta to right common carotid bypass and aorta to right subclavian bypass  ? Stroke [facility])   ? ? ?Past Surgical History:  ?Procedure Laterality Date  ? ASCENDING AORTIC ROOT REPLACEMENT N/A 11/04/2020  ? Procedure: 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 WITH REPLACEMENT OF ASCENDING AORTA AND HEMIARCH USING HEMASHIELD PLATINUM 26MM GRAFT AND HEMASHIELD GOLD 14 X 8MM GRAFT, RESUSPENSION OF NATIVE VALVE, AORTA TO RIGHT CAROTID BYPASS, AORTA TO RIGHT SUBCLAVIAN BYPASS;  Surgeon: Rexene Alberts, MD;  Location: [place];  Service: Open Heart Surgery;  Laterality: N/A;  ? TEE WITHOUT CARDIOVERSION N/A 11/04/2020  ? Procedure: TRANSESOPHAGEAL ECHOCARDIOGRAM (TEE);  Surgeon: Rexene Alberts, MD;  Location: [place];  Service: Open Heart Surgery;  Laterality: N/A;  ? ? ?Current Medications: ?No outpatient medications have been marked as taking for the 07/07/21 encounter (Appointment) with Rob Hickman,  Tami Lin, PA.  ?  ? ?Allergies:   Patient has no known allergies.  ? ?Social History  ? ?Socioeconomic History  ? Marital status: Married  ?  Spouse name: Not on file  ? Number of children: Not on file  ? Years of education: Not on file  ? Highest education level: 9th grade  ?Occupational History  ? Not on file  ?Tobacco Use  ? Smoking status: Former  ?  Types: Cigarettes  ?  Quit date: 11/04/2020  ?  Years since quitting:  0.6  ? Smokeless tobacco: Never  ?Vaping Use  ? Vaping Use: Never used  ?Substance and Sexual Activity  ? Alcohol use: Yes  ?  Alcohol/week: 2.0 standard drinks  ?  Types: 2 Cans of beer per week  ? Drug use: Not Currently  ? Sexual activity: Yes  ?Other Topics Concern  ? Not on file  ?Social History Narrative  ? Not on file  ? ?Social Determinants of Health  ? ?Financial Resource Strain: Not on file  ?Food Insecurity: Not on file  ?Transportation Needs: Not on file  ?Physical Activity: Not on file  ?Stress: Not on file  ?Social Connections: Not on file  ?  ? ?Family History: ?The patient's ***family history is not on file. ? ?ROS:   ?Please see the history of present illness.    ?*** All other systems reviewed and are negative. ? ?EKGs/Labs/Other Studies Reviewed:   ? ?The following studies were reviewed today: ?*** ? ?EKG:  EKG is *** ordered today.  The ekg ordered today demonstrates *** ? ?Recent Labs: ?11/14/2020: B Natriuretic Peptide 353.0 ?11/20/2020: Magnesium 2.6 ?11/28/2020: ALT 27 ?12/08/2020: BUN 18; Creatinine, Ser 1.16; Hemoglobin 11.0; Platelets 243; Potassium 4.3; Sodium 137  ?Recent Lipid Panel ?   ?Component Value Date/Time  ? CHOL 112 11/06/2020 0510  ? TRIG 114 11/15/2020 0348  ? HDL 24 (L) 11/06/2020 0510  ? CHOLHDL 4.7 11/06/2020 0510  ? VLDL 39 11/06/2020 0510  ? [place] 49 11/06/2020 0510  ? ? ? ?Risk Assessment/Calculations:   ?{Does this patient have ATRIAL FIBRILLATION?[phone]} ? ?    ? ?Physical Exam:   ? ?VS:  There were no vitals taken for this visit.   ? ?Wt Readings from Last 3 Encounters:  ?06/26/21 149 lb (67.6 kg)  ?06/23/21 150 lb 9.6 oz (68.3 kg)  ?05/20/21 148 lb (67.1 kg)  ?  ? ?GEN: *** Well nourished, well developed in no acute distress ?HEENT: Normal ?NECK: No JVD; No carotid bruits ?LYMPHATICS: No lymphadenopathy ?CARDIAC: ***RRR, no murmurs, rubs, gallops ?RESPIRATORY:  Clear to auscultation without rales, wheezing or rhonchi  ?ABDOMEN: Soft, non-tender,  non-distended ?MUSCULOSKELETAL:  No edema; No deformity  ?SKIN: Warm and dry ?NEUROLOGIC:  Alert and oriented x 3 ?PSYCHIATRIC:  Normal affect  ? ?ASSESSMENT:   ? ?No diagnosis found. ?PLAN:   ? ?In order of problems listed above: ? ?*** ? ?   ? ?{Are you ordering a CV Procedure (e.g. stress test, cath, DCCV, TEE, etc)?   Press F2        :[id number]  ? ? ?Medication Adjustments/Labs and Tests Ordered: ?Current medicines are reviewed at length with the patient today.  Concerns regarding medicines are outlined above.  ?No orders of the defined types were placed in this encounter. ? ?No orders of the defined types were placed in this encounter. ? ? ?There are no Patient Instructions on file for this visit.  ? ?Signed, ?Ledora Bottcher, PA  ?  06/30/2021 6:40 PM    ?[place] ?

## 2021-07-01 ENCOUNTER — Ambulatory Visit: Payer: Medicaid Other | Admitting: Speech Pathology

## 2021-07-01 ENCOUNTER — Other Ambulatory Visit: Payer: Self-pay

## 2021-07-01 ENCOUNTER — Encounter: Payer: Self-pay | Admitting: Speech Pathology

## 2021-07-01 ENCOUNTER — Ambulatory Visit: Payer: Medicaid Other

## 2021-07-01 VITALS — BP 138/98

## 2021-07-01 DIAGNOSIS — R2681 Unsteadiness on feet: Secondary | ICD-10-CM

## 2021-07-01 DIAGNOSIS — R482 Apraxia: Secondary | ICD-10-CM

## 2021-07-01 DIAGNOSIS — R4701 Aphasia: Secondary | ICD-10-CM

## 2021-07-01 DIAGNOSIS — R2689 Other abnormalities of gait and mobility: Secondary | ICD-10-CM

## 2021-07-01 NOTE — Therapy (Signed)
[facility] [address] [place], [place], [address] Phone: [phone]   Fax:  [phone]  Speech Language Pathology Treatment  Patient Details  Name: Jesse [name] MRN: [id number] Date of Birth: 1976/02/04 Referring Provider (SLP): Dr. Dorna Mai   Encounter Date: 07/01/2021   End of Session - 07/01/21 1118     Visit Number 10    Number of Visits 15    Date for SLP Re-Evaluation 08/05/21    Authorization Type Medicaid  PT is requesting 6 visits, I am requesting 75, so that will leave him with 10 for the remainder of the year    Authorization - Visit Number 9    Authorization - Number of Visits 11    SLP Start Time 1015    SLP Stop Time  1100    SLP Time Calculation (min) 45 min    Activity Tolerance Patient tolerated treatment well             Past Medical History:  Diagnosis Date   Anxiety    Depression    Essential hypertension    Myocardial infarction ([place])    S/P aortic dissection repair 11/05/2020   Straight graft replacement of ascending aorta and proximal transverse aortic arch with re-suspension of native aortic valve and open hemi arch distal anastomosis with aorta to right common carotid bypass and aorta to right subclavian bypass   Stroke [facility])     Past Surgical History:  Procedure Laterality Date   ASCENDING AORTIC ROOT REPLACEMENT N/A 11/04/2020   Procedure: 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 WITH REPLACEMENT OF ASCENDING AORTA AND HEMIARCH USING HEMASHIELD PLATINUM 26MM GRAFT AND HEMASHIELD GOLD 14 X 8MM GRAFT, RESUSPENSION OF NATIVE VALVE, AORTA TO RIGHT CAROTID BYPASS, AORTA TO RIGHT SUBCLAVIAN BYPASS;  Surgeon: Rexene Alberts, MD;  Location: [facility];  Service: Open Heart Surgery;  Laterality: N/A;   TEE WITHOUT CARDIOVERSION N/A 11/04/2020   Procedure: TRANSESOPHAGEAL ECHOCARDIOGRAM (TEE);  Surgeon: Rexene Alberts, MD;  Location: [place] [facility];  Service: Open Heart Surgery;   Laterality: N/A;    There were no vitals filed for this visit.   Subjective Assessment - 07/01/21 1019     Subjective "It's just been 1 day"    Patient is accompained by: Family member   Jesse [name]   Currently in Pain? Yes    Pain Score 9     Pain Location Foot    Pain Orientation Right;Left    Pain Descriptors / Indicators Tingling;Throbbing    Pain Type Neuropathic pain    Pain Onset More than a month ago    Pain Frequency Constant                   ADULT SLP TREATMENT - 07/01/21 1028       General Information   Behavior/Cognition Alert;Cooperative;Pleasant mood      Treatment Provided   Treatment provided Cognitive-Linquistic      Cognitive-Linquistic Treatment   Treatment focused on Aphasia;Apraxia;Patient/family/caregiver education    Skilled Treatment Targeted written expression at word level - with consistent extended time and occasional min to mod verbal and written cues, Jesse [name] wrote names of 11 family members. Targeted auditory comprehension and word finding comedy movie titles with quote, plot, actor cues - Jesse [name] named 15 movies with occasional min A and repeptiion or additional cues needed for auditory comprehension 5/15x      Assessment / Recommendations / [place] with current plan of care  Progression Toward Goals   Progression toward goals Progressing toward goals              SLP Education - 07/01/21 1115     Education Details practice family names - writing    Person(s) Educated Patient;Spouse    Methods Explanation;Verbal cues;Demonstration    Comprehension Verbalized understanding;Returned demonstration;Verbal cues required              SLP Short Term Goals - 07/01/21 1116       SLP SHORT TERM GOAL #1   Title pt will functionally name 7 members of his family/close friends correctly with modified independence in 3 session    Baseline 1; 01-19-21, 01-23-21, 01-26-21    Time 6    Status Achieved      SLP SHORT TERM GOAL  #2   Title Pt will ID and self correct paraphasias in structured task 8/10 with occasional min A    Baseline 50% ID errors on eval    Time 1    Period Weeks    Status Achieved      SLP SHORT TERM GOAL #3   Title Pt will increase utterance legnth to 5-7 words with occasional min A    Baseline 3-4 words    Time 1    Period Weeks    Status Achieved      SLP SHORT TERM GOAL #4   Title Pt will use compensations for aphasia 75% of anomic episodes    Baseline 50%; 06-15-21    Time 1    Period Weeks    Status Achieved      SLP SHORT TERM GOAL #5   Title pt will demo functional articulation (no neologism) in 3 simple conversational turns in 2 sessions    Time 6    Status Achieved              SLP Long Term Goals - 07/01/21 1117       SLP LONG TERM GOAL #1   Title In structured task, such as VNeST, pt will generate complex sentnece with occasional min A and extended time    Baseline simple phrases only on eval; 06/24/21    Time 7    Period Weeks    Status On-going      SLP LONG TERM GOAL #2   Title pt will functionally communicate in 5 minutes simple conversation using compensations in 2 sessions    Baseline 2 minutes; 06/22/21    Time 7    Period Weeks    Status On-going      SLP LONG TERM GOAL #3   Title Spouse will use 2 strategies to A pt with auditory comprehension 3x    Baseline not demonstrated    Time 7    Period Weeks    Status On-going      SLP LONG TERM GOAL #4   Title Pt will write personal information and family names with occasional min A    Baseline his name only    Time 7    Period Weeks    Status On-going      SLP LONG TERM GOAL #5   Title Pt/spouse will improve score on Communication Participation Item Bank by 2 points    Baseline score of 9    Time 7    Period Weeks    Status On-going              Plan - 07/01/21 1115     Clinical Impression  Statement Ongoing moderate apahsia affecting Jesse [name]'s communication with family. He is reporting  increased frustration due to aphasia as well as visual changes and pain in his feet. Discussion about how mood, stress and frustration affect speech and language. Ongoing skilled ST recommended to improve communication for safety, indepenedence, QOL and reduce caregiver burden    Speech Therapy Frequency 2x / week   1x a week for 3 weeks, 2x a week for 4 weeds   Duration 8 weeks    Treatment/Interventions Compensatory techniques;Functional tasks;Multimodal communcation approach;Cueing hierarchy;SLP instruction and feedback;Language facilitation;Cognitive reorganization;Compensatory strategies;Internal/external aids;Patient/family education    Potential to Achieve Goals Good    Potential Considerations Financial resources             Patient will benefit from skilled therapeutic intervention in order to improve the following deficits and impairments:   Aphasia  Verbal apraxia    Problem List Patient Active Problem List   Diagnosis Date Noted   Altered sensation due to recent stroke 02/19/2021   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   AKI (acute kidney injury) ([place])    Benign essential HTN    Hyponatremia    Acute blood loss anemia    Urinary retention    Pressure injury of skin 11/17/2020   Respiratory arrest ([place]) 11/14/2020   Acute respiratory failure with hypoxia ([place]) 11/14/2020   Adverse effects of medication 11/14/2020   Hypotension 11/14/2020   Encounter for central line placement    Aortic dissection ([place]) 11/13/2020   Acute ischemic right MCA stroke ([place]) 11/13/2020   Agitation 11/13/2020   Atelectasis    Phlebitis    Polysubstance abuse ([place])    Elevated troponin 11/07/2020   New onset left bundle branch block (LBBB) 11/07/2020   Protein-calorie malnutrition, severe 11/07/2020   Encephalopathy acute    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

## 2021-07-01 NOTE — Therapy (Signed)
[facility] [address] [place], [place], [address] Phone: [phone]   Fax:  [phone]  Physical Therapy Treatment  Patient Details  Name: Jesse [name] MRN: [id number] Date of Birth: 21-Sep-1975 Referring Provider (PT): Georganna Skeans   Encounter Date: 07/01/2021   PT End of Session - 07/01/21 1107     Visit Number 4    Number of Visits 9    Date for PT Re-Evaluation 07/10/21    Authorization Type Medicaid [employer]    PT Start Time 1105    PT Stop Time 1145    PT Time Calculation (min) 40 min    Equipment Utilized During Treatment Gait belt    Activity Tolerance Patient tolerated treatment well             Past Medical History:  Diagnosis Date   Anxiety    Depression    Essential hypertension    Myocardial infarction ([facility])    S/P aortic dissection repair 11/05/2020   Straight graft replacement of ascending aorta and proximal transverse aortic arch with re-suspension of native aortic valve and open hemi arch distal anastomosis with aorta to right common carotid bypass and aorta to right subclavian bypass   Stroke [facility])     Past Surgical History:  Procedure Laterality Date   ASCENDING AORTIC ROOT REPLACEMENT N/A 11/04/2020   Procedure: 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 WITH REPLACEMENT OF ASCENDING AORTA AND HEMIARCH USING HEMASHIELD PLATINUM GRAFT AND HEMASHIELD GOLD 14 X GRAFT, RESUSPENSION OF NATIVE VALVE, AORTA TO RIGHT CAROTID BYPASS, AORTA TO RIGHT SUBCLAVIAN BYPASS;  Surgeon: Purcell Nails, MD;  Location[facility];  Service: Open Heart Surgery;  Laterality: N/A;   TEE WITHOUT CARDIOVERSION N/A 11/04/2020   Procedure: TRANSESOPHAGEAL ECHOCARDIOGRAM (TEE);  Surgeon: Purcell Nails, MD;  Location: [facility] OR;  Service: Open Heart Surgery;  Laterality: N/A;    Vitals:   07/01/21 1107  BP: (!) 138/98     Subjective Assessment - 07/01/21 1107     Subjective Pt reports that he  is still having the pain in feet. Has increased the gabepentin and hoping that will help but so far no change. They have both new BP meds in system. Have started a log.    Patient is accompained by: Family member   wife, [name]   Pertinent History Pt admitted 11/04/20 with acute onset aphasia, blurry vision. CTA revealed acute aortic dissection. Pt with worsening o2 requirements requiring intubation 7/15, self extubated 7/16. 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 Transferred to CIR 7/21-7/22 and returned to acute hospital after respiratory failure with intubation x 24 hours.   PMH: Anxiety, Depression, Essential hypertension.    Patient Stated Goals Pt wants to be like I used to be and improve balance.    Currently in Pain? Yes    Pain Score 8     Pain Location Foot    Pain Orientation Right;Left    Pain Descriptors / Indicators Aching;Sharp    Pain Type Neuropathic pain    Pain Onset More than a month ago    Pain Frequency Constant    Aggravating Factors  being on feet a lot                               [facility] Adult PT Treatment/Exercise - 07/01/21 1112  Ambulation/Gait   Ambulation/Gait Yes    Ambulation/Gait Assistance 5: Supervision    Ambulation/Gait Assistance Details between activities during session    Assistive device None    Gait Pattern Step-through pattern    Ambulation Surface Level;Indoor      Neuro Re-ed    Neuro Re-ed Details  At counter: tandem walking with occasional UE support 10' x 6 close SBA/CGA, braiding with light UE support at times 10' x 4, reciprocal stepping over 4 cones with tapping each cone prior to increase SLS time without UE support CGA. Verbal cues to engage core for more stability. BP=158/110 after. After sitting a couple minutes =138/92. SLS with foot on soccerball x 30 sec, then moving ball ant/post x 10 then side to side x  10. Performed on both sides close SBA/CGA with pt having to touch some during left SLS with side to side movements. BP=142/100 at end                     PT Education - 07/01/21 1152     Education Details PT discussed concerns with elevated BP and not being able to push pt due to this. Will reassess next visit and decide if need to hold at this time until BP is more stabilized. Pt and wife to continue keeping BP log and check after activity as well.    Person(s) Educated Patient;Spouse    Methods Explanation    Comprehension Verbalized understanding              PT Short Term Goals - 06/24/21 1304       PT SHORT TERM GOAL #1   Title Pt will perform HEP with family supervision for improved strength, balance, and gait.    Baseline need to add to current activities; 06/24/2021 pt states he has not yet done all the exercises, only attempted braiding.    Time 4    Period Weeks    Status On-going    Target Date 06/10/21      PT SHORT TERM GOAL #2   Title Pt will increase 30 sec sit to stand from 9 reps to >11 reps for improved functional strength.    Baseline 05/13/21 9 reps without hands from chair; 06/24/2021 12 reps without hands from standard chair height    Time 4    Period Weeks    Status Achieved    Target Date 06/10/21      PT SHORT TERM GOAL #3   Title Pt will increase FGA from 20/30 to >22/30 for improved balance and gait safety.    Baseline 05/13/21 20/30; 06/24/2021 22/30    Time 4    Period Weeks    Status Achieved    Target Date 06/10/21               PT Long Term Goals - 05/13/21 1934       PT LONG TERM GOAL #1   Title Pt will increase FGA from 20/30 to >24/30 for improved balance and gait safety.    Baseline 05/13/21 20/30    Time 8    Period Weeks    Status New    Target Date 07/10/21      PT LONG TERM GOAL #2   Title Pt will increase gait speed form 0.5379m/s to >1.5355m/s for improved community mobility.    Baseline 05/13/21 0.7579m/s    Time 8     Period Weeks    Status New    Target  Date 07/10/21      PT LONG TERM GOAL #3   Title Pt will increase left SLS to 5 sec or better for improved standing balance.    Baseline 05/13/21 1 sec    Time 8    Period Weeks    Status New    Target Date 07/10/21      PT LONG TERM GOAL #4   Title Pt will ambulate >1000' on varied surfaces without AD independently scanning environment without LOB for improved community mobility.    Baseline 200' supervision    Time 8    Period Weeks    Status New    Target Date 07/10/21                   Plan - 07/01/21 1154     Clinical Impression Statement PT continued to monitor BP closely. BP continues to rise with any activity especially diastolic. PT may hold after next session if continues to do this and notify cardiologist again as just started 2 new BP meds. Pt was challenged with SLS on left but improved some with cues to slow down and engage core.    Personal Factors and Comorbidities Comorbidity 3+;Time since onset of injury/illness/exacerbation    Comorbidities Anxiety, Depression, Essential hypertension. 1    Examination-Activity Limitations Stairs;Locomotion Level;Transfers    Examination-Participation Restrictions Occupation;Community Activity;Driving;Interpersonal Relationship    Stability/Clinical Decision Making Evolving/Moderate complexity    Rehab Potential Good    PT Frequency 1x / week   plus eval   PT Duration 8 weeks    PT Treatment/Interventions ADLs/Self Care Home Management;DME Instruction;Gait training;Stair training;Functional mobility training;Therapeutic activities;Therapeutic exercise;Balance training;Neuromuscular re-education;Passive range of motion;Patient/family education;Orthotic Fit/Training;Vestibular    PT Next Visit Plan Check goals for recert versus hold due to BP. Want to be able to push pt and do not want to use up all visits for year. Monitor vitals.  High level balance training- gait with horizontal head  turns, tandem walking, walking on compliant surfaces, SLS activities.    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   AKI (acute kidney injury) ([facility])    Benign essential HTN    Hyponatremia    Acute blood loss anemia    Urinary retention    Pressure injury of skin 11/17/2020   Respiratory arrest ([facility]) 11/14/2020   Acute respiratory failure with hypoxia ([facility]) 11/14/2020   Adverse effects of medication 11/14/2020   Hypotension 11/14/2020   Encounter for central line placement    Aortic dissection ([facility]) 11/13/2020   Acute ischemic right MCA stroke ([facility]) 11/13/2020   Agitation 11/13/2020   Atelectasis    Phlebitis    Polysubstance abuse ([facility])    Elevated troponin 11/07/2020   New onset left bundle branch block (LBBB) 11/07/2020   Protein-calorie malnutrition, severe 11/07/2020   Encephalopathy acute    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 [place], [place], [address] Phone: [phone]   Fax:  [phone]  Name: Jesse [name] MRN: [id number] Date of Birth: 12-Sep-1975

## 2021-07-07 ENCOUNTER — Ambulatory Visit: Payer: Medicaid Other | Admitting: Physician Assistant

## 2021-07-08 ENCOUNTER — Ambulatory Visit: Payer: Medicaid Other

## 2021-07-08 ENCOUNTER — Other Ambulatory Visit: Payer: Self-pay

## 2021-07-08 ENCOUNTER — Encounter: Payer: Self-pay | Admitting: Speech Pathology

## 2021-07-08 ENCOUNTER — Ambulatory Visit: Payer: Medicaid Other | Admitting: Speech Pathology

## 2021-07-08 VITALS — BP 106/70

## 2021-07-08 DIAGNOSIS — R2681 Unsteadiness on feet: Secondary | ICD-10-CM

## 2021-07-08 DIAGNOSIS — R4701 Aphasia: Secondary | ICD-10-CM

## 2021-07-08 DIAGNOSIS — R2689 Other abnormalities of gait and mobility: Secondary | ICD-10-CM

## 2021-07-08 NOTE — Therapy (Signed)
[facility] [address] [place], [place], [address] Phone: [phone]   Fax:  [phone]  Physical Therapy Treatment/Discharge Summary  Patient Details  Name: Jesse [name] MRN: [id number] Date of Birth: 1975-06-04 Referring Provider (PT): Georganna Skeans PHYSICAL THERAPY DISCHARGE SUMMARY  Visits from Start of Care: 5  Current functional level related to goals / functional outcomes: See clinical impression and goals for more information.   Remaining deficits: High level balance   Education / Equipment: HEP   Patient agrees to discharge. Patient goals were met. Patient is being discharged due to meeting the stated rehab goals.   Encounter Date: 07/08/2021   PT End of Session - 07/08/21 1107     Visit Number 5    Number of Visits 9    Date for PT Re-Evaluation 07/10/21    Authorization Type Medicaid [employer]    PT Start Time 1105    PT Stop Time 1143    PT Time Calculation (min) 38 min    Equipment Utilized During Treatment Gait belt    Activity Tolerance Patient tolerated treatment well             Past Medical History:  Diagnosis Date   Anxiety    Depression    Essential hypertension    Myocardial infarction (HCC)    S/P aortic dissection repair 11/05/2020   Straight graft replacement of ascending aorta and proximal transverse aortic arch with re-suspension of native aortic valve and open hemi arch distal anastomosis with aorta to right common carotid bypass and aorta to right subclavian bypass   Stroke [facility])     Past Surgical History:  Procedure Laterality Date   ASCENDING AORTIC ROOT REPLACEMENT N/A 11/04/2020   Procedure: 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 WITH REPLACEMENT OF ASCENDING AORTA AND HEMIARCH USING HEMASHIELD PLATINUM GRAFT AND HEMASHIELD GOLD 14 X GRAFT, RESUSPENSION OF NATIVE VALVE, AORTA TO RIGHT CAROTID BYPASS, AORTA TO RIGHT SUBCLAVIAN BYPASS;  Surgeon:  Purcell Nails, MD;  Location: [facility] OR;  Service: Open Heart Surgery;  Laterality: N/A;   TEE WITHOUT CARDIOVERSION N/A 11/04/2020   Procedure: TRANSESOPHAGEAL ECHOCARDIOGRAM (TEE);  Surgeon: Purcell Nails, MD;  Location: [facility] OR;  Service: Open Heart Surgery;  Laterality: N/A;    Vitals:   07/08/21 1107  BP: 106/70     Subjective Assessment - 07/08/21 1918     Subjective Wife reports BP has been running high when they have checked it.    Patient is accompained by: Family member   wife, Jesse [name]   Pertinent History Pt admitted 11/04/20 with acute onset aphasia, blurry vision. CTA revealed acute aortic dissection. Pt with worsening o2 requirements requiring intubation 7/15, self extubated 7/16. 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 Transferred to CIR 7/21-7/22 and returned to acute hospital after respiratory failure with intubation x 24 hours.   PMH: Anxiety, Depression, Essential hypertension.    Patient Stated Goals Pt wants to be like I used to be and improve balance.    Currently in Pain? Yes    Pain Score 8     Pain Location Foot    Pain Orientation Right;Left    Pain Descriptors / Indicators Aching;Sharp;Shooting    Pain Type Neuropathic pain    Pain Onset More than a month ago    Pain Frequency Constant    Aggravating Factors  being on feet a lot  Pain Relieving Factors nothing                [facility] PT Assessment - 07/08/21 1120       Functional Gait  Assessment   Gait assessed  Yes    Gait Level Surface Walks 20 ft in less than 5.5 sec, no assistive devices, good speed, no evidence for imbalance, normal gait pattern, deviates no more than 6 in outside of the 12 in walkway width.    Change in Gait Speed Able to smoothly change walking speed without loss of balance or gait deviation. Deviate no more than 6 in outside of the 12 in walkway width.    Gait with Horizontal  Head Turns Performs head turns smoothly with slight change in gait velocity (eg, minor disruption to smooth gait path), deviates 6-10 in outside 12 in walkway width, or uses an assistive device.    Gait with Vertical Head Turns Performs head turns with no change in gait. Deviates no more than 6 in outside 12 in walkway width.    Gait and Pivot Turn Pivot turns safely within 3 sec and stops quickly with no loss of balance.    Step Over Obstacle Is able to step over 2 stacked shoe boxes taped together (9 in total height) without changing gait speed. No evidence of imbalance.    Gait with Narrow Base of Support Ambulates 7-9 steps.    Gait with Eyes Closed Walks 20 ft, no assistive devices, good speed, no evidence of imbalance, normal gait pattern, deviates no more than 6 in outside 12 in walkway width. Ambulates 20 ft in less than 7 sec.    Ambulating Backwards Walks 20 ft, no assistive devices, good speed, no evidence for imbalance, normal gait    Steps Alternating feet, no rail.    Total Score 28                           [facility] Adult PT Treatment/Exercise - 07/08/21 1120       Ambulation/Gait   Ambulation/Gait Yes    Ambulation/Gait Assistance 6: Modified independent (Device/Increase time)    Ambulation Distance (Feet) 1100 Feet    Assistive device None    Gait Pattern Step-through pattern    Ambulation Surface Level;Indoor    Gait velocity 9.67 comfortable=1.93m/s and fast=1.45m/s    Stairs Yes    Stairs Assistance 6: Modified independent (Device/Increase time)    Stair Management Technique No rails;Alternating pattern    Number of Stairs 8    Height of Stairs 6    Ramp 7: Independent    Gait Comments BP=106/70 after FGA testing. BP after longer walk 118/82      Neuro Re-ed    Neuro Re-ed Details  SLS on left <2 sec                     PT Education - 07/08/21 1921     Education Details Discussed results of goal check. Plan to discharge today to  conserve visits for later in year and when BP is more stable.    Person(s) Educated Patient;Spouse    Methods Explanation    Comprehension Verbalized understanding              PT Short Term Goals - 06/24/21 1304       PT SHORT TERM GOAL #1   Title Pt will perform HEP with family supervision for improved strength, balance, and gait.  Baseline need to add to current activities; 06/24/2021 pt states he has not yet done all the exercises, only attempted braiding.    Time 4    Period Weeks    Status On-going    Target Date 06/10/21      PT SHORT TERM GOAL #2   Title Pt will increase 30 sec sit to stand from 9 reps to >11 reps for improved functional strength.    Baseline 05/13/21 9 reps without hands from chair; 06/24/2021 12 reps without hands from standard chair height    Time 4    Period Weeks    Status Achieved    Target Date 06/10/21      PT SHORT TERM GOAL #3   Title Pt will increase FGA from 20/30 to >22/30 for improved balance and gait safety.    Baseline 05/13/21 20/30; 06/24/2021 22/30    Time 4    Period Weeks    Status Achieved    Target Date 06/10/21               PT Long Term Goals - 07/08/21 1123       PT LONG TERM GOAL #1   Title Pt will increase FGA from 20/30 to >24/30 for improved balance and gait safety.    Baseline 05/13/21 20/30. 07/08/21 28/30    Time 8    Period Weeks    Status Achieved    Target Date 07/10/21      PT LONG TERM GOAL #2   Title Pt will increase gait speed form 0.75m/s to >1.58m/s for improved community mobility.    Baseline 05/13/21 0.16m/s. 07/08/21  9.67 comfortable=1.37m/s and fast=1.13m/s, FGA=28/30    Time 8    Period Weeks    Status Achieved    Target Date 07/10/21      PT LONG TERM GOAL #3   Title Pt will increase left SLS to 5 sec or better for improved standing balance.    Baseline 05/13/21 1 sec. 07/08/21 <2 sec left    Time 8    Period Weeks    Status Not Met    Target Date 07/10/21      PT LONG TERM GOAL #4    Title Pt will ambulate >1000' on varied surfaces without AD independently scanning environment without LOB for improved community mobility.    Baseline 200' supervision. 07/08/21 1100' on level inside and ramped surfaces. Pt had no LOB during FGA with head turns with only slight stagger correcting on own.    Time 8    Period Weeks    Status Achieved    Target Date 07/10/21                   Plan - 07/08/21 1924     Clinical Impression Statement Pt's BP was better at visit today so pt able to participate. Pt's wife reports they have been monitoring and it is running high often. They will continue to follow-up with MD about this and monitor. PT assessed goals with pt meeting 3/4 goals at this time. He increased FGA to 28/30 indicating low fall risk. Gait speed has increased to 1.64m/s comfortable and 1.36m/s fast indicating normal community ambulator speed. Pt has pain in feet when walking further but did well on indoor surfaces >1000'. Pt did not increase left SLS time. PT is discharging at this time to conserve visits for later in the year and to allow them time to continue to work with MDs to get  BP more consistently in safe range to be able to be pushed more in therapy. Pt and wife in agreement.    Personal Factors and Comorbidities Comorbidity 3+;Time since onset of injury/illness/exacerbation    Comorbidities Anxiety, Depression, Essential hypertension. 1    Examination-Activity Limitations Stairs;Locomotion Level;Transfers    Examination-Participation Restrictions Occupation;Community Activity;Driving;Interpersonal Relationship    Stability/Clinical Decision Making Evolving/Moderate complexity    Rehab Potential Good    PT Frequency 1x / week   plus eval   PT Duration 8 weeks    PT Treatment/Interventions ADLs/Self Care Home Management;DME Instruction;Gait training;Stair training;Functional mobility training;Therapeutic activities;Therapeutic exercise;Balance training;Neuromuscular  re-education;Passive range of motion;Patient/family education;Orthotic Fit/Training;Vestibular    PT Next Visit Plan Discharge today    Consulted and Agree with Plan of Care Patient    Family Member Consulted wife, Jesse [name]             Patient will benefit from skilled therapeutic intervention in order to improve the following deficits and impairments:  Abnormal gait, Decreased mobility, Decreased strength, Decreased balance, Impaired sensation  Visit Diagnosis: Other abnormalities of gait and mobility  Unsteadiness on feet     Problem List Patient Active Problem List   Diagnosis Date Noted   Altered sensation due to recent stroke 02/19/2021   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   AKI (acute kidney injury) (HCC)    Benign essential HTN    Hyponatremia    Acute blood loss anemia    Urinary retention    Pressure injury of skin 11/17/2020   Respiratory arrest (HCC) 11/14/2020   Acute respiratory failure with hypoxia (HCC) 11/14/2020   Adverse effects of medication 11/14/2020   Hypotension 11/14/2020   Encounter for central line placement    Aortic dissection (HCC) 11/13/2020   Acute ischemic right MCA stroke (HCC) 11/13/2020   Agitation 11/13/2020   Atelectasis    Phlebitis    Polysubstance abuse (HCC)    Elevated troponin 11/07/2020   New onset left bundle branch block (LBBB) 11/07/2020   Protein-calorie malnutrition, severe 11/07/2020   Encephalopathy acute    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

## 2021-07-08 NOTE — Patient Instructions (Signed)
? ?  Ask MD about depression medication and let her know that he is feeling flat and "blah"  ? ?Brain chemistry can be altered after a stroke and you may need medication to help balance the brain chemistry while your brain is healing ? ?Nida Boatman, talking is part of your therapy - if you don't talk to different people, you won't get better as quickly - it will take longer ? ? ?

## 2021-07-08 NOTE — Therapy (Signed)
[facility], [place], [address] ?Phone: [phone]   Fax:  [phone] ? ?Speech Language Pathology Treatment ? ?Patient Details  ?Name: Jesse [name] ?MRN: [id number] ?Date of Birth: [birth year] ?Referring Provider (SLP): Dr. Dorna Mai ? ? ?Encounter Date: 07/08/2021 ? ? End of Session - 07/08/21 1202   ? ? Visit Number 11   ? Number of Visits 15   ? Date for SLP Re-Evaluation 08/05/21   ? Authorization Type Medicaid  PT is requesting 6 visits, I am requesting 11, so that will leave him with 10 for the remainder of the year   ? Authorization - Visit Number 10   ? Authorization - Number of Visits 11   ? SLP Start Time 1015   ? SLP Stop Time  1100   ? SLP Time Calculation (min) 45 min   ? Activity Tolerance Patient tolerated treatment well   ? ?  ?  ? ?  ? ? ?Past Medical History:  ?Diagnosis Date  ? Anxiety   ? Depression   ? Essential hypertension   ? Myocardial infarction [facility])   ? 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 Straight graft replacement of ascending aorta and proximal transverse aortic arch with re-suspension of native aortic valve and open hemi arch distal anastomosis with aorta to right common carotid bypass and aorta to right subclavian bypass  ? Stroke [facility])   ? ? ?Past Surgical History:  ?Procedure Laterality Date  ? ASCENDING AORTIC ROOT REPLACEMENT N/A 11/04/2020  ? Procedure: 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 WITH REPLACEMENT OF ASCENDING AORTA AND HEMIARCH USING HEMASHIELD PLATINUM 26MM GRAFT AND HEMASHIELD GOLD 14 X 8MM GRAFT, RESUSPENSION OF NATIVE VALVE, AORTA TO RIGHT CAROTID BYPASS, AORTA TO RIGHT SUBCLAVIAN BYPASS;  Surgeon: Rexene Alberts, MD;  Location: [place];  Service: Open Heart Surgery;  Laterality: N/A;  ? TEE WITHOUT CARDIOVERSION N/A 11/04/2020  ? Procedure: TRANSESOPHAGEAL ECHOCARDIOGRAM (TEE);  Surgeon: Rexene Alberts, MD;  Location: [place];  Service: Open Heart Surgery;   Laterality: N/A;  ? ? ?There were no vitals filed for this visit. ? ? Subjective Assessment - 07/08/21 1203   ? ? Subjective "He has had a bad week"   ? Patient is accompained by: Family member   Gina  ? Currently in Pain? Yes   ? Pain Score 8    ? Pain Location Foot   ? Pain Orientation Right;Left   ? Pain Descriptors / Indicators Aching;Shooting;Sharp   ? Pain Type Neuropathic pain   ? ?  ?  ? ?  ? ? ? ? ? ? ? ? ADULT SLP TREATMENT - 07/08/21 1021   ? ?  ? General Information  ? Behavior/Cognition Alert;Cooperative;Pleasant mood   ?  ? Treatment Provided  ? Treatment provided Cognitive-Linquistic   ?  ? Cognitive-Linquistic Treatment  ? Treatment focused on Aphasia;Apraxia;Patient/family/caregiver education   ? Skilled Treatment Targeted written expression with family names, Jesse [name] wrote 8 family names requiring mod A with 1/8, others written independently with exteded time. Targeted sentence generation with multi meaning sentences with frequent mod semantic and questioning cues 8/8 and consistent extended time. In conversation, Jesse [name] is using gestures and some description/associated word cues with occasional  min A.   ?  ? Assessment / Recommendations / Plan  ? Plan Continue with current plan of care   ?  ? Progression Toward Goals  ? Progression toward goals Progressing toward goals   ? ?  ?  ? ?  ? ? ? ? ?  SLP Short Term Goals - 07/08/21 1207   ? ?  ? SLP SHORT TERM GOAL #1  ? Title pt will functionally name 7 members of his family/close friends correctly with modified independence in 3 session   ? Baseline 1; 01-19-21, 01-23-21, 01-26-21   ? Time 6   ? Status Achieved   ?  ? SLP SHORT TERM GOAL #2  ? Title Pt will ID and self correct paraphasias in structured task 8/10 with occasional min A   ? Baseline 50% ID errors on eval   ? Time 1   ? Period Weeks   ? Status Achieved   ?  ? SLP SHORT TERM GOAL #3  ? Title Pt will increase utterance legnth to 5-7 words with occasional min A   ? Baseline 3-4 words   ? Time 1   ?  Period Weeks   ? Status Achieved   ?  ? SLP SHORT TERM GOAL #4  ? Title Pt will use compensations for aphasia 75% of anomic episodes   ? Baseline 50%; 06-15-21   ? Time 1   ? Period Weeks   ? Status Achieved   ?  ? SLP SHORT TERM GOAL #5  ? Title pt will demo functional articulation (no neologism) in 3 simple conversational turns in 2 sessions   ? Time 6   ? Status Achieved   ? ?  ?  ? ?  ? ? ? SLP Long Term Goals - 07/08/21 1207   ? ?  ? SLP LONG TERM GOAL #1  ? Title In structured task, such as VNeST, pt will generate complex sentnece with occasional min A and extended time   ? Baseline simple phrases only on eval; 06/24/21   ? Time 6   ? Period Weeks   ? Status On-going   ?  ? SLP LONG TERM GOAL #2  ? Title pt will functionally communicate in 5 minutes simple conversation using compensations in 2 sessions   ? Baseline 2 minutes; 06/22/21   ? Time 6   ? Period Weeks   ? Status On-going   ?  ? SLP LONG TERM GOAL #3  ? Title Spouse will use 2 strategies to A pt with auditory comprehension 3x   ? Baseline not demonstrated   ? Time 6   ? Period Weeks   ? Status On-going   ?  ? SLP LONG TERM GOAL #4  ? Title Pt will write personal information and family names with occasional min A   ? Baseline his name only   ? Time 6   ? Period Weeks   ? Status Achieved   ?  ? SLP LONG TERM GOAL #5  ? Title Pt/spouse will improve score on Communication Participation Item Bank by 2 points   ? Baseline score of 9   ? Time 6   ? Period Weeks   ? Status On-going   ? ?  ?  ? ?  ? ? ? Plan - 07/08/21 1206   ? ? Clinical Impression Statement Ongoing moderate apahsia affecting Jesse [name]'s communication with family. He is reporting increased frustration due to aphasia as well as visual changes and pain in his feet. Discussion about how mood, stress and frustration affect speech and language. Ongoing skilled ST recommended to improve communication for safety, indepenedence, QOL and reduce caregiver burden. Jesse [name] and Jesse [name] endorse increased depression -  instructed them to talk to MD about this during PCP appointment in April   ?  Speech Therapy Frequency 2x / week   1x a week for 3 weeks, then 2x a week for 4 weeks  ? Duration 8 weeks   ? Treatment/Interventions Compensatory techniques;Functional tasks;Multimodal communcation approach;Cueing hierarchy;SLP instruction and feedback;Language facilitation;Cognitive reorganization;Compensatory strategies;Internal/external aids;Patient/family education   ? Potential to Achieve Goals Good   ? Potential Considerations Financial resources   ? ?  ?  ? ?  ? ? ?Patient will benefit from skilled therapeutic intervention in order to improve the following deficits and impairments:   ?Aphasia ? ? ? ?Problem List ?Patient Active Problem List  ? Diagnosis Date Noted  ? Altered sensation due to recent stroke 02/19/2021  ? 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 AKI (acute kidney injury) ([place])   ? Benign essential HTN   ? Hyponatremia   ? Acute blood loss anemia   ? Urinary retention   ? Pressure injury of skin 11/17/2020  ? Respiratory arrest ([place]) 11/14/2020  ? Acute respiratory failure with hypoxia ([place]) 11/14/2020  ? Adverse effects of medication 11/14/2020  ? Hypotension 11/14/2020  ? Encounter for central line placement   ? Aortic dissection ([place]) 11/13/2020  ? Acute ischemic right MCA stroke ([place]) 11/13/2020  ? Agitation 11/13/2020  ? Atelectasis   ? Phlebitis   ? Polysubstance abuse ([place])   ? Elevated troponin 11/07/2020  ? New onset left bundle branch block (LBBB) 11/07/2020  ? Protein-calorie malnutrition, severe 11/07/2020  ? Encephalopathy acute   ? Cerebral infarction due to thrombosis of cerebral artery ([place])   ? Acute hypoxemic respiratory failure ([place])   ? Acute thoracic aortic dissection 11/05/2020  ? 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 Cerebral embolism with cerebral infarction 11/05/2020  ? Essential hypertension   ? ? ?Samya Siciliano, Annye Rusk, CCC-SLP ?07/08/2021, 12:08 PM ? ?[facility] ?[place] ?[place], [place], [address] ?Phone: [phone]   Fax:  [phone] ? ? ?Name: Jesse [name] ?MRN: [id number] ?Date of Birth: [birth year] ? ?

## 2021-07-15 ENCOUNTER — Ambulatory Visit: Payer: Medicaid Other

## 2021-07-15 ENCOUNTER — Ambulatory Visit: Payer: Medicaid Other | Admitting: Speech Pathology

## 2021-07-15 ENCOUNTER — Other Ambulatory Visit: Payer: Self-pay

## 2021-07-15 ENCOUNTER — Encounter: Payer: Self-pay | Admitting: Speech Pathology

## 2021-07-15 DIAGNOSIS — R4701 Aphasia: Secondary | ICD-10-CM

## 2021-07-15 DIAGNOSIS — R2681 Unsteadiness on feet: Secondary | ICD-10-CM | POA: Diagnosis not present

## 2021-07-15 DIAGNOSIS — R482 Apraxia: Secondary | ICD-10-CM

## 2021-07-15 NOTE — Therapy (Signed)
[facility] [address] [place], [place], [address] Phone: [phone]   Fax:  [phone]  Speech Language Pathology Treatment  Patient Details  Name: Jesse [name] MRN: [id number] Date of Birth: 1975/07/24 Referring Provider (SLP): Dr. Georganna Skeans   Encounter Date: 07/15/2021   End of Session - 07/15/21 1349     Visit Number 12    Number of Visits 15    Date for SLP Re-Evaluation 08/05/21    Authorization Type Medicaid  PT is requesting 6 visits, I am requesting 11, so that will leave him with 10 for the remainder of the year. I am requesting 3 more visits after initial 11 for a total of 14    Authorization - Visit Number 11    Authorization - Number of Visits 11    SLP Start Time 1015    SLP Stop Time  1100    SLP Time Calculation (min) 45 min    Activity Tolerance Patient tolerated treatment well             Past Medical History:  Diagnosis Date   Anxiety    Depression    Essential hypertension    Myocardial infarction (HCC)    S/P aortic dissection repair 11/05/2020   Straight graft replacement of ascending aorta and proximal transverse aortic arch with re-suspension of native aortic valve and open hemi arch distal anastomosis with aorta to right common carotid bypass and aorta to right subclavian bypass   Stroke [facility])     Past Surgical History:  Procedure Laterality Date   ASCENDING AORTIC ROOT REPLACEMENT N/A 11/04/2020   Procedure: 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 WITH REPLACEMENT OF ASCENDING AORTA AND HEMIARCH USING HEMASHIELD PLATINUM GRAFT AND HEMASHIELD GOLD 14 X GRAFT, RESUSPENSION OF NATIVE VALVE, AORTA TO RIGHT CAROTID BYPASS, AORTA TO RIGHT SUBCLAVIAN BYPASS;  Surgeon: Purcell Nails, MD;  Location: [facility] OR;  Service: Open Heart Surgery;  Laterality: N/A;   TEE WITHOUT CARDIOVERSION N/A 11/04/2020   Procedure: TRANSESOPHAGEAL ECHOCARDIOGRAM (TEE);  Surgeon: Purcell Nails, MD;  Location: [facility] OR;  Service: Open Heart Surgery;  Laterality: N/A;    There were no vitals filed for this visit.   Subjective Assessment - 07/15/21 1024     Subjective "He saw his brothers and sisters and dad"    Patient is accompained by: Family member   Jesse [name]   Currently in Pain? Yes    Pain Score 8     Pain Location Foot    Pain Orientation Right;Left    Pain Descriptors / Indicators Aching;Sharp;Shooting    Pain Type Neuropathic pain    Pain Onset More than a month ago    Pain Frequency Constant                   ADULT SLP TREATMENT - 07/15/21 1048       General Information   Behavior/Cognition Alert;Cooperative;Pleasant mood      Treatment Provided   Treatment provided Cognitive-Linquistic      Cognitive-Linquistic Treatment   Treatment focused on Aphasia;Apraxia;Patient/family/caregiver education    Skilled Treatment   To target sentence generations, word finding and verbal apraxia  Scientist, product/process development (VNeST) was utilized. The pt generated 3 subjects and objects for 3 (carry, wrap and fold) verbs, for a total of 9 subject objects. Pt required occasional min cues. Pt generated 3 complex sentences by answering "wh" questions. Pt required occasional min cues to generate complex  sentences.  In simple conversation, Jesse [name] communicated in sentences 5-7 words in length with pauses for word finding and spontaneous use of verbal and gestural compensations 3x over 5 minutes with occasional min questioning cues, with topic of his kids and their activities      Assessment / Recommendations / Plan   Plan Continue with current plan of care      Progression Toward Goals   Progression toward goals Progressing toward goals                SLP Short Term Goals - 07/15/21 1351       SLP SHORT TERM GOAL #1   Title pt will functionally name 7 members of his family/close friends correctly with modified independence in 3 session    Baseline  1; 01-19-21, 01-23-21, 01-26-21    Time 6    Status Achieved      SLP SHORT TERM GOAL #2   Title Pt will ID and self correct paraphasias in structured task 8/10 with occasional min A    Baseline 50% ID errors on eval    Time 1    Period Weeks    Status Achieved      SLP SHORT TERM GOAL #3   Title Pt will increase utterance legnth to 5-7 words with occasional min A    Baseline 3-4 words    Time 1    Period Weeks    Status Achieved      SLP SHORT TERM GOAL #4   Title Pt will use compensations for aphasia 75% of anomic episodes    Baseline 50%; 06-15-21    Time 1    Period Weeks    Status Achieved      SLP SHORT TERM GOAL #5   Title pt will demo functional articulation (no neologism) in 3 simple conversational turns in 2 sessions    Time 6    Status Achieved              SLP Long Term Goals - 07/15/21 1351       SLP LONG TERM GOAL #1   Title In structured task, such as VNeST, pt will generate complex sentnece with occasional min A and extended time    Baseline simple phrases only on eval; 06/24/21    Time 6    Period Weeks    Status Achieved      SLP LONG TERM GOAL #2   Title pt will functionally communicate in 5 minutes simple conversation using compensations in 2 sessions    Baseline 2 minutes; 06/22/21    Time 6    Period Weeks    Status Achieved      SLP LONG TERM GOAL #3   Title Spouse will use 2 strategies to A pt with auditory comprehension 3x    Baseline not demonstrated    Time 6    Period Weeks    Status On-going      SLP LONG TERM GOAL #4   Title Pt will write personal information and family names with occasional min A    Baseline his name only    Time 6    Period Weeks    Status Achieved      SLP LONG TERM GOAL #5   Title Pt/spouse will improve score on Communication Participation Item Bank by 2 points    Baseline score of 9    Time 6    Period Weeks    Status On-going  Additional Long Term Goals   Additional Long Term Goals Yes       SLP LONG TERM GOAL #6   Title Pt wil participate in 8 turns of conversation with occasional min A and 4 or less requests for clarification over 3 sessions    Baseline 4 -5 turns    Time 6    Period Weeks    Status New              Plan - 07/15/21 1351     Clinical Impression Statement Ongoing moderate apahsia affecting Jesse [name]'s communication with family. He is reporting increased frustration due to aphasia as well as visual changes and pain in his feet. Discussion about how mood, stress and frustration affect speech and language. Ongoing skilled ST recommended to improve communication for safety, indepenedence, QOL and reduce caregiver burden. Jesse [name] and Jesse [name] endorse increased depression - instructed them to talk to MD about this during PCP appointment in April    Speech Therapy Frequency 2x / week    Duration 8 weeks    Treatment/Interventions Compensatory techniques;Functional tasks;Multimodal communcation approach;Cueing hierarchy;SLP instruction and feedback;Language facilitation;Cognitive reorganization;Compensatory strategies;Internal/external aids;Patient/family education    Potential to Achieve Goals Good    Potential Considerations Financial resources             Patient will benefit from skilled therapeutic intervention in order to improve the following deficits and impairments:   Aphasia  Verbal apraxia    Problem List Patient Active Problem List   Diagnosis Date Noted   Altered sensation due to recent stroke 02/19/2021   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   AKI (acute kidney injury) (HCC)    Benign essential HTN    Hyponatremia    Acute blood loss anemia    Urinary retention    Pressure injury of skin 11/17/2020   Respiratory arrest (HCC) 11/14/2020   Acute respiratory failure with hypoxia (HCC) 11/14/2020   Adverse effects of medication 11/14/2020   Hypotension 11/14/2020   Encounter for central line placement    Aortic dissection (HCC) 11/13/2020   Acute ischemic  right MCA stroke (HCC) 11/13/2020   Agitation 11/13/2020   Atelectasis    Phlebitis    Polysubstance abuse (HCC)    Elevated troponin 11/07/2020   New onset left bundle branch block (LBBB) 11/07/2020   Protein-calorie malnutrition, severe 11/07/2020   Encephalopathy acute    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

## 2021-07-22 ENCOUNTER — Encounter: Payer: Self-pay | Admitting: Speech Pathology

## 2021-07-22 ENCOUNTER — Ambulatory Visit: Payer: Medicaid Other | Admitting: Speech Pathology

## 2021-07-22 DIAGNOSIS — R482 Apraxia: Secondary | ICD-10-CM

## 2021-07-22 DIAGNOSIS — R4701 Aphasia: Secondary | ICD-10-CM

## 2021-07-22 DIAGNOSIS — R2681 Unsteadiness on feet: Secondary | ICD-10-CM | POA: Diagnosis not present

## 2021-07-22 NOTE — Therapy (Signed)
[facility] ?[address] ?[place], [place], [address] ?Phone: [phone]   Fax:  [phone] ? ?Speech Language Pathology Treatment ? ?Patient Details  ?Name: Jesse [name] ?MRN: [id number] ?Date of Birth: [birth year] ?Referring Provider (SLP): Dr. Georganna SkeansAmelia Wilson ? ? ?Encounter Date: 07/22/2021 ? ? End of Session - 07/22/21 1423   ? ? Visit Number 13   ? Number of Visits 15   ? Date for SLP Re-Evaluation 08/05/21   ? Authorization - Visit Number 13   ? Authorization - Number of Visits 15   ? SLP Start Time 1315   ? SLP Stop Time  1400   ? SLP Time Calculation (min) 45 min   ? Activity Tolerance Patient tolerated treatment well   ? ?  ?  ? ?  ? ? ?Past Medical History:  ?Diagnosis Date  ? Anxiety   ? Depression   ? Essential hypertension   ? Myocardial infarction [facility](HCC)   ? 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 Straight graft replacement of ascending aorta and proximal transverse aortic arch with re-suspension of native aortic valve and open hemi arch distal anastomosis with aorta to right common carotid bypass and aorta to right subclavian bypass  ? Stroke [facility](HCC)   ? ? ?Past Surgical History:  ?Procedure Laterality Date  ? ASCENDING AORTIC ROOT REPLACEMENT N/A 11/04/2020  ? Procedure: 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 WITH REPLACEMENT OF ASCENDING AORTA AND HEMIARCH USING HEMASHIELD PLATINUM 26MM GRAFT AND HEMASHIELD GOLD 14 X 8MM GRAFT, RESUSPENSION OF NATIVE VALVE, AORTA TO RIGHT CAROTID BYPASS, AORTA TO RIGHT SUBCLAVIAN BYPASS;  Surgeon: Purcell Nailswen, Clarence H, MD;  Location: [facility] OR;  Service: Open Heart Surgery;  Laterality: N/A;  ? TEE WITHOUT CARDIOVERSION N/A 11/04/2020  ? Procedure: TRANSESOPHAGEAL ECHOCARDIOGRAM (TEE);  Surgeon: Purcell Nailswen, Clarence H, MD;  [facility];  Service: Open Heart Surgery;  Laterality: N/A;  ? ? ?There were no vitals filed for this visit. ? ? Subjective Assessment - 07/22/21 1424   ? ? Subjective "We were pakcing stuff  still"   ? Patient is accompained by: Family member   ? Currently in Pain? Yes   ? Pain Score 8    ? Pain Location Foot   ? Pain Orientation Right;Left   ? Pain Descriptors / Indicators Aching;Shooting;Sharp   ? Pain Type Neuropathic pain   ? Pain Onset More than a month ago   ? ?  ?  ? ?  ? ? ? ? ? ? ? ? ADULT SLP TREATMENT - 07/22/21 1335   ? ?  ? General Information  ? Behavior/Cognition Alert;Cooperative;Pleasant mood   ?  ? Treatment Provided  ? Treatment provided Cognitive-Linquistic   ?  ? Cognitive-Linquistic Treatment  ? Treatment focused on Aphasia;Apraxia;Patient/family/caregiver education   ? Skilled Treatment Targeted reading comprehension, verbal apraxia and convergent naming in reading, IDing out one out and generating sentence IDing category - Jesse [name] required extended time consistently and benefitted from enlarged font. He read 40/48 words with supervision cues, requiring occasional min A to read 10/48 words. He required usual min A to verbalize category 20/48x. He required A on mid to low frequency words and two syllable words. Nida BoatmanBrad is consistently IDing and self correcting errors in structured tasks with extended time. In conversation re: his nephew's health and son's gf, Jesse [name] used 5-8 word utterances, verbal compensations for word finding and usual extended time and pausing for word finding. Overall quite improved.   ?  ?  Assessment / Recommendations / Plan  ? Plan Continue with current plan of care;Goals updated   ?  ? Progression Toward Goals  ? Progression toward goals Progressing toward goals   ? ?  ?  ? ?  ? ? ? ? ? SLP Short Term Goals - 07/22/21 1427   ? ?  ? SLP SHORT TERM GOAL #1  ? Title pt will functionally name 7 members of his family/close friends correctly with modified independence in 3 session   ? Baseline 1; 01-19-21, 01-23-21, 01-26-21   ? Time 6   ? Status Achieved   ?  ? SLP SHORT TERM GOAL #2  ? Title Pt will ID and self correct paraphasias in structured task 8/10 with occasional  min A   ? Baseline 50% ID errors on eval   ? Time 1   ? Period Weeks   ? Status Achieved   ?  ? SLP SHORT TERM GOAL #3  ? Title Pt will increase utterance legnth to 5-7 words with occasional min A   ? Baseline 3-4 words   ? Time 1   ? Period Weeks   ? Status Achieved   ?  ? SLP SHORT TERM GOAL #4  ? Title Pt will use compensations for aphasia 75% of anomic episodes   ? Baseline 50%; 06-15-21   ? Time 1   ? Period Weeks   ? Status Achieved   ?  ? SLP SHORT TERM GOAL #5  ? Title pt will demo functional articulation (no neologism) in 3 simple conversational turns in 2 sessions   ? Time 6   ? Status Achieved   ? ?  ?  ? ?  ? ? ? SLP Long Term Goals - 07/22/21 1427   ? ?  ? SLP LONG TERM GOAL #1  ? Title In structured task, such as VNeST, pt will generate complex sentnece with occasional min A and extended time   ? Baseline simple phrases only on eval; 06/24/21   ? Time 6   ? Period Weeks   ? Status Achieved   ?  ? SLP LONG TERM GOAL #2  ? Title pt will functionally communicate in 5 minutes simple conversation using compensations in 2 sessions   ? Baseline 2 minutes; 06/22/21   ? Time 6   ? Period Weeks   ? Status Achieved   ?  ? SLP LONG TERM GOAL #3  ? Title Spouse will use 2 strategies to A pt with auditory comprehension 3x   ? Baseline not demonstrated   ? Time 5   ? Period Weeks   ? Status On-going   ?  ? SLP LONG TERM GOAL #4  ? Title Pt will write personal information and family names with occasional min A   ? Baseline his name only   ? Time 6   ? Period Weeks   ? Status Achieved   ?  ? SLP LONG TERM GOAL #5  ? Title Pt/spouse will improve score on Communication Participation Item Bank by 2 points   ? Baseline score of 9   ? Time 5   ? Period Weeks   ? Status On-going   ?  ? SLP LONG TERM GOAL #6  ? Title Pt wil participate in 8 turns of conversation with occasional min A and 4 or less requests for clarification over 3 sessions   ? Baseline 4 -5 turns   ? Time 6   ? Period  Weeks   ? Status New   ? ?  ?  ? ?  ? ? ?  Plan - 07/22/21 1424   ? ? Clinical Impression Statement Improving moderate aphasia affecting communication with family and friends. Reading at word and short phrase level improved with extended time. Verbal apraxia also improving with less halting and groping. Simple convesation improved to 6 turns with 5-8 word utterance length. Nida Boatman is IDing and attempting to correct paraphasias. Ongoing ST required to improve communication for safety, independence and QOL and to reduce pt and caregiver frustration.   ? Speech Therapy Frequency 1x /week   1x a week d/t medicaid visit limits  ? Duration 8 weeks   ? Treatment/Interventions Compensatory techniques;Functional tasks;Multimodal communcation approach;Cueing hierarchy;SLP instruction and feedback;Language facilitation;Cognitive reorganization;Compensatory strategies;Internal/external aids;Patient/family education   ? Potential to Achieve Goals Good   ? Potential Considerations Financial resources   ? ?  ?  ? ?  ? ? ?Patient will benefit from skilled therapeutic intervention in order to improve the following deficits and impairments:   ?Aphasia ? ?Verbal apraxia ? ? ? ?Problem List ?Patient Active Problem List  ? Diagnosis Date Noted  ? Altered sensation due to recent stroke 02/19/2021  ? 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 AKI (acute kidney injury) (HCC)   ? Benign essential HTN   ? Hyponatremia   ? Acute blood loss anemia   ? Urinary retention   ? Pressure injury of skin 11/17/2020  ? Respiratory arrest (HCC) 11/14/2020  ? Acute respiratory failure with hypoxia (HCC) 11/14/2020  ? Adverse effects of medication 11/14/2020  ? Hypotension 11/14/2020  ? Encounter for central line placement   ? Aortic dissection (HCC) 11/13/2020  ? Acute ischemic right MCA stroke (HCC) 11/13/2020  ? Agitation 11/13/2020  ? Atelectasis   ? Phlebitis   ? Polysubstance abuse (HCC)   ? Elevated troponin 11/07/2020  ? New onset left bundle branch block (LBBB) 11/07/2020  ? Protein-calorie malnutrition,  severe 11/07/2020  ? Encephalopathy acute   ? 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 Acute hypoxemic respiratory failure (HCC)   ? Acute thoracic aortic dissection 11/05/2020

## 2021-07-28 ENCOUNTER — Ambulatory Visit: Payer: Medicaid Other | Admitting: Family Medicine

## 2021-07-28 ENCOUNTER — Encounter: Payer: Self-pay | Admitting: Family Medicine

## 2021-07-28 VITALS — BP 78/58 | HR 94 | Temp 97.9°F | Resp 16 | Wt 145.8 lb

## 2021-07-28 DIAGNOSIS — M792 Neuralgia and neuritis, unspecified: Secondary | ICD-10-CM | POA: Diagnosis not present

## 2021-07-28 DIAGNOSIS — Z8673 Personal history of transient ischemic attack (TIA), and cerebral infarction without residual deficits: Secondary | ICD-10-CM

## 2021-07-28 DIAGNOSIS — I959 Hypotension, unspecified: Secondary | ICD-10-CM

## 2021-07-28 DIAGNOSIS — F329 Major depressive disorder, single episode, unspecified: Secondary | ICD-10-CM | POA: Diagnosis not present

## 2021-07-28 NOTE — Progress Notes (Signed)
Patient is very concerned about his consistent foot pain. Patient explains pain as throbbing, pins and needle, and cold. Patient said that having chronic foot pain post CVA has been very difficult . Patient has no interested in doing anything . Patient is also concern about blood pressure.  ?

## 2021-07-29 ENCOUNTER — Telehealth: Payer: Self-pay | Admitting: Internal Medicine

## 2021-07-29 ENCOUNTER — Encounter: Payer: Self-pay | Admitting: Speech Pathology

## 2021-07-29 ENCOUNTER — Ambulatory Visit: Payer: Medicaid Other | Attending: Physician Assistant | Admitting: Speech Pathology

## 2021-07-29 ENCOUNTER — Encounter: Payer: Self-pay | Admitting: Family Medicine

## 2021-07-29 DIAGNOSIS — R482 Apraxia: Secondary | ICD-10-CM | POA: Insufficient documentation

## 2021-07-29 DIAGNOSIS — R4701 Aphasia: Secondary | ICD-10-CM | POA: Diagnosis present

## 2021-07-29 NOTE — Therapy (Signed)
[facility] ?[place], [place], [address] ?Phone: [phone]   Fax:  [phone] ? ?Speech Language Pathology Treatment ? ?Patient Details  ?Name: Jesse [name] ?MRN: [id number] ?Date of Birth: 01/14/1976 ?Referring Provider (SLP): Dr. Dorna Mai ? ? ?Encounter Date: 07/29/2021 ? ? End of Session - 07/29/21 1233   ? ? Visit Number 14   ? Number of Visits 15   ? Date for SLP Re-Evaluation 08/05/21   ? Authorization Type Medicaid  PT is requesting 6 visits, I am requesting 11, so that will leave him with 10 for the remainder of the year. I am requesting 3 more visits after initial 11 for a total of 14   ? Authorization - Visit Number 14   ? Authorization - Number of Visits 15   ? SLP Start Time 1020   pt arrived late  ? SLP Stop Time  1100   ? SLP Time Calculation (min) 40 min   ? Activity Tolerance Patient tolerated treatment well   ? ?  ?  ? ?  ? ? ?Past Medical History:  ?Diagnosis Date  ? Anxiety   ? Depression   ? Essential hypertension   ? Myocardial infarction Jesse [name])   ? 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 Straight graft replacement of ascending aorta and proximal transverse aortic arch with re-suspension of native aortic valve and open hemi arch distal anastomosis with aorta to right common carotid bypass and aorta to right subclavian bypass  ? Stroke [facility])   ? ? ?Past Surgical History:  ?Procedure Laterality Date  ? ASCENDING AORTIC ROOT REPLACEMENT N/A 11/04/2020  ? Procedure: 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 WITH REPLACEMENT OF ASCENDING AORTA AND HEMIARCH USING HEMASHIELD PLATINUM 26MM GRAFT AND HEMASHIELD GOLD 14 X 8MM GRAFT, RESUSPENSION OF NATIVE VALVE, AORTA TO RIGHT CAROTID BYPASS, AORTA TO RIGHT SUBCLAVIAN BYPASS;  Surgeon: Jesse Alberts, MD;  Location: [facility];  Service: Open Heart Surgery;  Laterality: N/A;  ? TEE WITHOUT CARDIOVERSION N/A 11/04/2020  ? Procedure: TRANSESOPHAGEAL ECHOCARDIOGRAM (TEE);   Surgeon: Jesse Alberts, MD;  Location: [facility];  Service: Open Heart Surgery;  Laterality: N/A;  ? ? ?There were no vitals filed for this visit. ? ? Subjective Assessment - 07/29/21 1025   ? ? Subjective "His blood pressure is running low"   ? Patient is accompained by: Family member   Jesse [name]  ? Currently in Pain? Yes   ? Pain Score 9    ? Pain Location Foot   ? Pain Orientation Right;Left   ? Pain Descriptors / Indicators Aching;Tingling;Throbbing;Sore   ? Pain Type Neuropathic pain   ? Pain Onset More than a month ago   ? Pain Frequency Constant   ? ?  ?  ? ?  ? ? ? ? ? ? ? ? ADULT SLP TREATMENT - 07/29/21 1027   ? ?  ? General Information  ? Behavior/Cognition Alert;Cooperative;Pleasant mood   ?  ? Treatment Provided  ? Treatment provided Cognitive-Linquistic   ?  ? Cognitive-Linquistic Treatment  ? Treatment focused on Aphasia;Apraxia;Patient/family/caregiver education   ? Skilled Treatment Barnett Applebaum and [name] report susccessful completion of HW, convergennt naming. Jesse [name] spontaneously asked about my Easter plans. Using response elabloration training, asking "wh"questions to exapnd the length of his utterances, we conversed about his family's Easter traditions. Jesse [name] primarily used 1-2 word utterances, however with RET, he increased this to 5 words. Targeted verbal compensations for aphasia, word finding, conversation  and auditory comprehension taking turns generating 3-4 descriptions of mid to low frerquency photos of objects and activitites. With extended time and occasional min to mod questioning cues, Jesse [name] generated salient descriptions 8/8 photos. He named 7/8 that I described, requiring 3/8 phonemic or 1st letter cues. In between structured describing/naming task, I engaged Jesse [name] in conversation re: the object. With extended time and quesitoning cues, Jesse [name] generated 2-3 utterances about the objects, including personal stories about surfing, white water rafting and using a VHS tape.   ?  ? Assessment /  Recommendations / Plan  ? Plan Continue with current plan of care;Goals updated   ?  ? Progression Toward Goals  ? Progression toward goals Progressing toward goals   ? ?  ?  ? ?  ? ? ? SLP Education - 07/29/21 1231   ? ? Education Details get out albums, converse about past events, vacations, childrens holidays   ? Person(s) Educated Patient;Spouse   ? Methods Explanation;Verbal cues   ? Comprehension Verbalized understanding   ? ?  ?  ? ?  ? ? ? SLP Short Term Goals - 07/29/21 1232   ? ?  ? SLP SHORT TERM GOAL #1  ? Title pt will functionally name 7 members of his family/close friends correctly with modified independence in 3 session   ? Baseline 1; 01-19-21, 01-23-21, 01-26-21   ? Time 6   ? Status Achieved   ?  ? SLP SHORT TERM GOAL #2  ? Title Pt will ID and self correct paraphasias in structured task 8/10 with occasional min A   ? Baseline 50% ID errors on eval   ? Time 1   ? Period Weeks   ? Status Achieved   ?  ? SLP SHORT TERM GOAL #3  ? Title Pt will increase utterance legnth to 5-7 words with occasional min A   ? Baseline 3-4 words   ? Time 1   ? Period Weeks   ? Status Achieved   ?  ? SLP SHORT TERM GOAL #4  ? Title Pt will use compensations for aphasia 75% of anomic episodes   ? Baseline 50%; 06-15-21   ? Time 1   ? Period Weeks   ? Status Achieved   ?  ? SLP SHORT TERM GOAL #5  ? Title pt will demo functional articulation (no neologism) in 3 simple conversational turns in 2 sessions   ? Time 6   ? Status Achieved   ? ?  ?  ? ?  ? ? ? SLP Long Term Goals - 07/29/21 1232   ? ?  ? SLP LONG TERM GOAL #1  ? Title In structured task, such as VNeST, pt will generate complex sentnece with occasional min A and extended time   ? Baseline simple phrases only on eval; 06/24/21   ? Time 5   ? Period Weeks   ? Status Achieved   ?  ? SLP LONG TERM GOAL #2  ? Title pt will functionally communicate in 5 minutes simple conversation using compensations in 2 sessions   ? Baseline 2 minutes; 06/22/21   ? Time 6   ? Period Weeks    ? Status Achieved   ?  ? SLP LONG TERM GOAL #3  ? Title Spouse will use 2 strategies to A pt with auditory comprehension 3x   ? Baseline not demonstrated   ? Time 4   ? Period Weeks   ? Status On-going   ?  ?  SLP LONG TERM GOAL #4  ? Title Pt will write personal information and family names with occasional min A   ? Baseline his name only   ? Time 6   ? Period Weeks   ? Status Achieved   ?  ? SLP LONG TERM GOAL #5  ? Title Pt/spouse will improve score on Communication Participation Item Bank by 2 points   ? Baseline score of 9   ? Time 4   ? Period Weeks   ? Status On-going   ?  ? SLP LONG TERM GOAL #6  ? Title Pt wil participate in 8 turns of conversation with occasional min A and 4 or less requests for clarification over 3 sessions   ? Baseline 4 -5 turns   ? Time 5   ? Period Weeks   ? Status On-going   ? ?  ?  ? ?  ? ? ? Plan - 07/29/21 1231   ? ? Clinical Impression Statement Improving moderate aphasia affecting communication with family and friends. Reading at word and short phrase level improved with extended time. Verbal apraxia also improving with less halting and groping. Simple convesation improved to 6 turns with 5-8 word utterance length. [name] is IDing and attempting to correct paraphasias. Ongoing ST required to improve communication for safety, independence and QOL and to reduce pt and caregiver frustration.   ? Speech Therapy Frequency 1x /week   1x a week due to medicaid visit limits  ? Duration 8 weeks   ? Treatment/Interventions Compensatory techniques;Functional tasks;Multimodal communcation approach;Cueing hierarchy;SLP instruction and feedback;Language facilitation;Cognitive reorganization;Compensatory strategies;Internal/external aids;Patient/family education   ? Potential to Achieve Goals Good   ? ?  ?  ? ?  ? ? ?Patient will benefit from skilled therapeutic intervention in order to improve the following deficits and impairments:   ?Aphasia ? ?Verbal apraxia ? ? ? ?Problem List ?Patient  Active Problem List  ? Diagnosis Date Noted  ? Altered sensation due to recent stroke 02/19/2021  ? 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 AKI (acute kidney injury) ([place])   ? Benign essential HTN   ? Hyponatremia   ? Acute blood los

## 2021-07-29 NOTE — Telephone Encounter (Signed)
Pt c/o medication issue: ? ?1. Name of Medication: losartan (COZAAR) 50 MG tablet ? ?2. How are you currently taking this medication (dosage and times per day)? Take 1 tablet (50 mg total) by mouth daily. ? ?3. Are you having a reaction (difficulty breathing--STAT)?  ? ?4. What is your medication issue? Patient's wife wants to clarify if he should take the medication in the morning or at night.  Dr. Debara Pickett had told him to take it at night.  But on the bottle it says to take in the morning.  He has been taking it in the morning.   ?

## 2021-07-29 NOTE — Progress Notes (Signed)
? ?Established Patient Office Visit ? ?Subjective:  ?Patient ID: Jesse [name], male    DOB: 1976/02/11  Age: 46 y.o. MRN: [id number] ? ?CC:  ?Chief Complaint  ?Patient presents with  ? Hypertension  ? Follow-up  ? Foot Pain  ? ? ?HPI ?Jesse [name] presents for follow up of chronic med issues. He is here with his s.o. She reports that he has been sitting around and somewhat angry with his situation and not wanting to always participate in appointments and activities that are recommended.  ? ?Past Medical History:  ?Diagnosis Date  ? Anxiety   ? Depression   ? Essential hypertension   ? Myocardial infarction [facility])   ? 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 Straight graft replacement of ascending aorta and proximal transverse aortic arch with re-suspension of native aortic valve and open hemi arch distal anastomosis with aorta to right common carotid bypass and aorta to right subclavian bypass  ? Stroke [facility])   ? ? ?Past Surgical History:  ?Procedure Laterality Date  ? ASCENDING AORTIC ROOT REPLACEMENT N/A 11/04/2020  ? Procedure: 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 WITH REPLACEMENT OF ASCENDING AORTA AND HEMIARCH USING HEMASHIELD PLATINUM 26MM GRAFT AND HEMASHIELD GOLD 14 X 8MM GRAFT, RESUSPENSION OF NATIVE VALVE, AORTA TO RIGHT CAROTID BYPASS, AORTA TO RIGHT SUBCLAVIAN BYPASS;  Surgeon: Rexene Alberts, MD;  Location: [place];  Service: Open Heart Surgery;  Laterality: N/A;  ? TEE WITHOUT CARDIOVERSION N/A 11/04/2020  ? Procedure: TRANSESOPHAGEAL ECHOCARDIOGRAM (TEE);  Surgeon: Rexene Alberts, MD;  Location: [facility];  Service: Open Heart Surgery;  Laterality: N/A;  ? ? ?No family history on file. ? ?Social History  ? ?Socioeconomic History  ? Marital status: Married  ?  Spouse name: Not on file  ? Number of children: Not on file  ? Years of education: Not on file  ? Highest education level: 9th grade  ?Occupational History  ? Not on file  ?Tobacco Use  ? Smoking status: Former  ?  Types: Cigarettes  ?   Quit date: 11/04/2020  ?  Years since quitting: 0.7  ? Smokeless tobacco: Never  ?Vaping Use  ? Vaping Use: Never used  ?Substance and Sexual Activity  ? Alcohol use: Yes  ?  Alcohol/week: 2.0 standard drinks  ?  Types: 2 Cans of beer per week  ? Drug use: Not Currently  ? Sexual activity: Yes  ?Other Topics Concern  ? Not on file  ?Social History Narrative  ? Not on file  ? ?Social Determinants of Health  ? ?Financial Resource Strain: Not on file  ?Food Insecurity: Not on file  ?Transportation Needs: Not on file  ?Physical Activity: Not on file  ?Stress: Not on file  ?Social Connections: Not on file  ?Intimate Partner Violence: Not on file  ? ? ?ROS ?Review of Systems  ?Neurological:  Positive for weakness and numbness.  ?Psychiatric/Behavioral:  Positive for sleep disturbance. Negative for self-injury and suicidal ideas. The patient is nervous/anxious.   ?All other systems reviewed and are negative. ? ?Objective:  ? ?Today's Vitals: BP (!) 78/58   Pulse 94   Temp 97.9 ?F (36.6 ?C) (Oral)   Resp 16   Wt 145 lb 12.8 oz (66.1 kg)   SpO2 94%   BMI 17.75 kg/m?  ? ?Physical Exam ?Vitals and nursing note reviewed.  ?Constitutional:   ?   General: He is not in acute distress. ?HENT:  ?   Head: Normocephalic and atraumatic.  ?  Cardiovascular:  ?   Rate and Rhythm: Normal rate and regular rhythm.  ?Pulmonary:  ?   Effort: Pulmonary effort is normal.  ?   Breath sounds: Normal breath sounds.  ?Abdominal:  ?   Palpations: Abdomen is soft.  ?   Tenderness: There is no abdominal tenderness.  ?Neurological:  ?   General: No focal deficit present.  ?   Mental Status: He is alert and oriented to person, place, and time.  ?   Motor: Weakness present.  ?Psychiatric:     ?   Mood and Affect: Mood normal.     ?   Behavior: Behavior normal.  ? ? ?Assessment & Plan:  ? ?1. Hypotension, unspecified hypotension type ?Follow up as necessary with consultant.  ? ?2. Reactive depression ?Continue present management. Keep scheduled appt  with [facility] for further eval/mgt ? ?3. History of CVA (cerebrovascular accident) ?Improving. Management per consultant ? ?4. Neuralgia ?Continue recommended management as per consultant ? ? ?Outpatient Encounter Medications as of 07/28/2021  ?Medication Sig  ? acetaminophen (TYLENOL) 325 MG tablet Take 1-2 tablets (325-650 mg total) by mouth every 4 (four) hours as needed for mild pain.  ? amLODipine (NORVASC) 5 MG tablet Take 0.5 tablets (2.5 mg total) by mouth in the morning and at bedtime.  ? aspirin 81 MG chewable tablet Chew 1 tablet (81 mg total) by mouth daily.  ? carvedilol (COREG) 3.125 MG tablet TAKE 1 TABLET BY MOUTH TWICE A DAY WITH MEALS  ? gabapentin (NEURONTIN) 300 MG capsule Take 1 capsule (300 mg total) by mouth 3 (three) times daily.  ? losartan (COZAAR) 50 MG tablet Take 1 tablet (50 mg total) by mouth daily.  ? mirtazapine (REMERON) 7.5 MG tablet Take 1 tablet (7.5 mg total) by mouth at bedtime.  ? pantoprazole sodium (PROTONIX) 40 mg/20 mL PACK Dissolve 1 packet in 20 mLs (40 mg total) and take by mouth daily.  ? polyethylene glycol (MIRALAX / GLYCOLAX) 17 g packet Take 17 g by mouth daily as needed for moderate constipation.  ? QUEtiapine (SEROQUEL) 50 MG tablet TAKE 1 TABLET BY MOUTH AT BEDTIME. AND 1/2 TABLET DURING THE DAY AS NEEDED FOR AGITATION  ? rosuvastatin (CRESTOR) 20 MG tablet Take 1 tablet (20 mg total) by mouth daily.  ? tamsulosin (FLOMAX) 0.4 MG CAPS capsule Take 2 capsules (0.8 mg total) by mouth daily after supper.  ? Zinc Sulfate 220 (50 Zn) MG TABS Take 1 tablet (220 mg total) by mouth daily.  ? ferrous sulfate 325 (65 FE) MG tablet Take 1 tablet (325 mg total) by mouth daily.  ? gabapentin (NEURONTIN) 100 MG capsule Take 100 mg by mouth 2 (two) times daily.  ? ?No facility-administered encounter medications on file as of 07/28/2021.  ? ? ?Follow-up: No follow-ups on file.  ? ?Becky Sax, MD ? ?

## 2021-07-29 NOTE — Telephone Encounter (Signed)
Called pt's wife back. She states she read Dr. Blanchie Dessert note and it said night but but bottle says otherwise she she just wanted to know when is better. I advised her the first dose should be at night just because it can cause dizziness but after that it can be taking any time of the day. She verbalized understanding and thanked me for calling her back.  ?

## 2021-08-12 ENCOUNTER — Ambulatory Visit: Payer: Medicaid Other | Admitting: Speech Pathology

## 2021-08-12 ENCOUNTER — Ambulatory Visit
Admission: RE | Admit: 2021-08-12 | Discharge: 2021-08-12 | Disposition: A | Discharge status: Home / Self Care | Payer: Medicaid Other | Source: Ambulatory Visit | Attending: Thoracic Surgery (Cardiothoracic Vascular Surgery) | Admitting: Thoracic Surgery (Cardiothoracic Vascular Surgery)

## 2021-08-12 DIAGNOSIS — I700 Atherosclerosis of aorta: Secondary | ICD-10-CM | POA: Diagnosis not present

## 2021-08-12 DIAGNOSIS — I7101 Dissection of ascending aorta: Secondary | ICD-10-CM

## 2021-08-12 IMAGING — CT CT ANGIO CHEST
1 series · 19 of 32 positions shown · IV contrast (agent unspecified)
Comparison: CTA of the chest, abdomen and pelvis 02/05/2021.

CLINICAL DATA: 45-year-old male with history of aortic dissection
status post repair.

EXAM:
CT ANGIOGRAPHY CHEST WITH CONTRAST
TECHNIQUE: Multidetector CT imaging of the chest was performed using the
standard protocol during bolus administration of intravenous
contrast. Multiplanar CT image reconstructions and MIPs were
obtained to evaluate the vascular anatomy.

[Series 19: cta thorax 2.00 bv36 s3 cor mip · coronal · 0.68mm/px · 19 of 139 slices shown]
[im 6/139  lung]
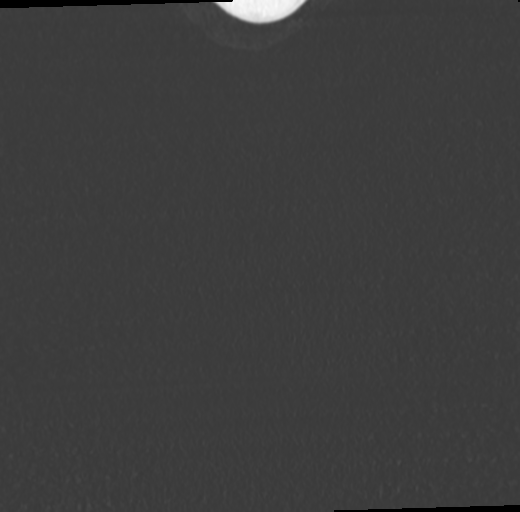
[im 16/139  mediastinal]
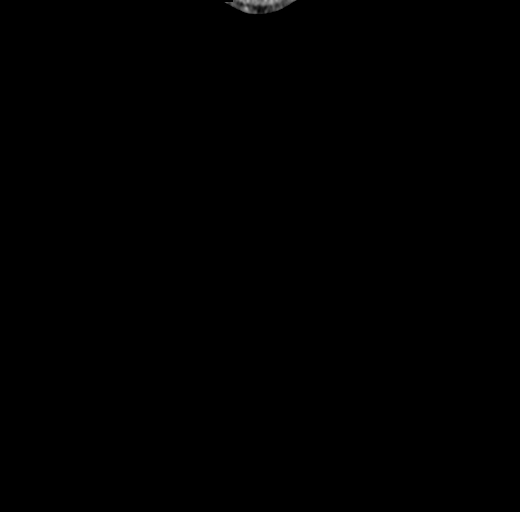
[im 21/139  lung]
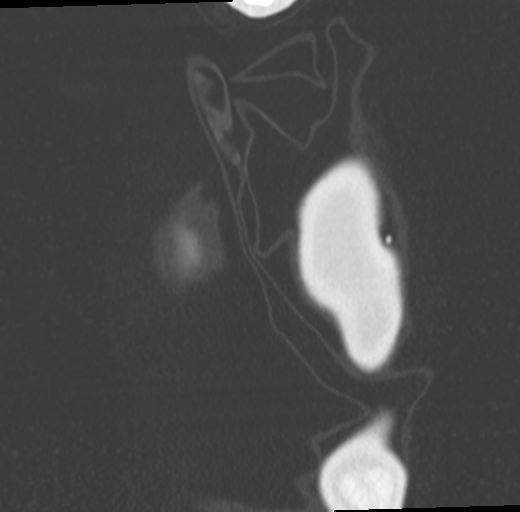
[im 28/139  mediastinal]
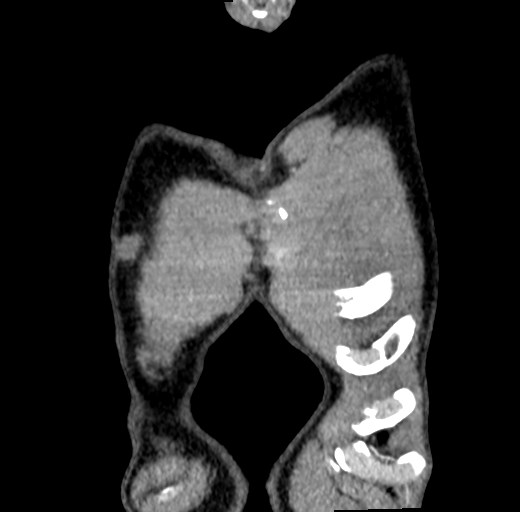
[im 36/139  lung]
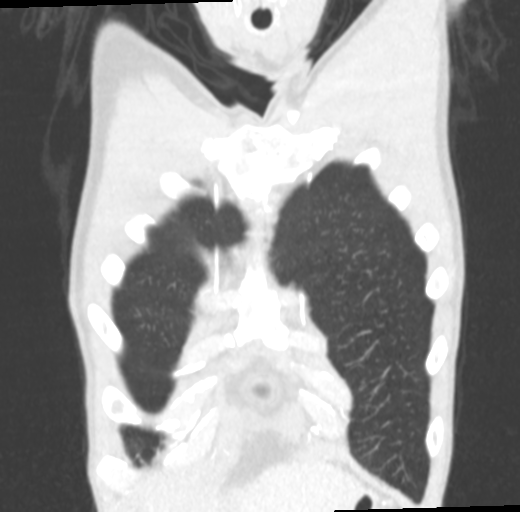
[im 41/139  mediastinal]
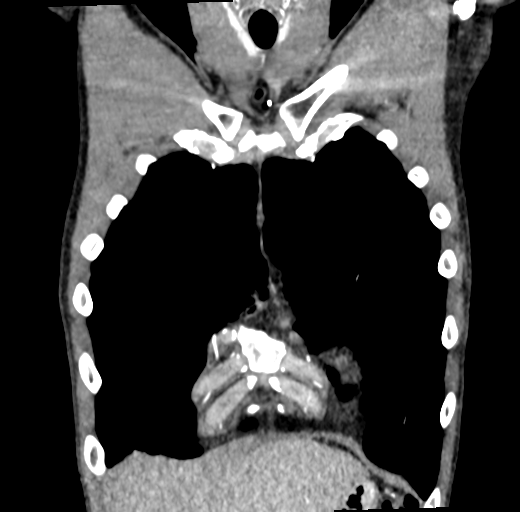
[im 52/139  lung]
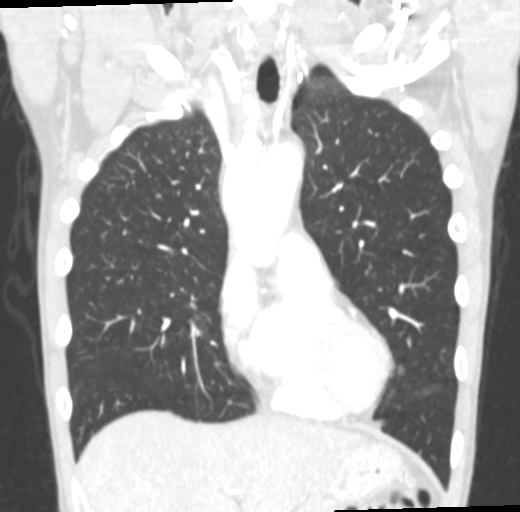
[im 56/139  mediastinal]
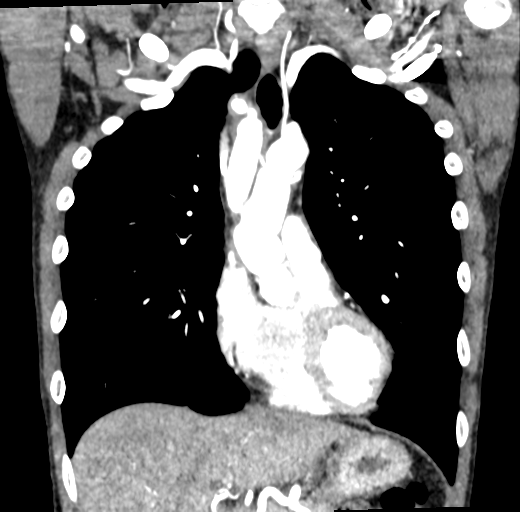
[im 62/139  lung]
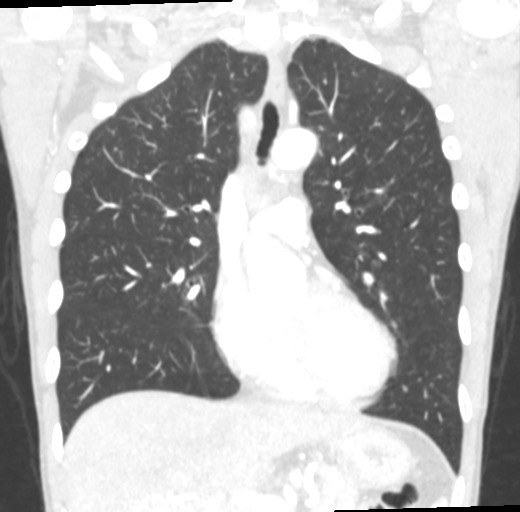
[im 72/139  mediastinal]
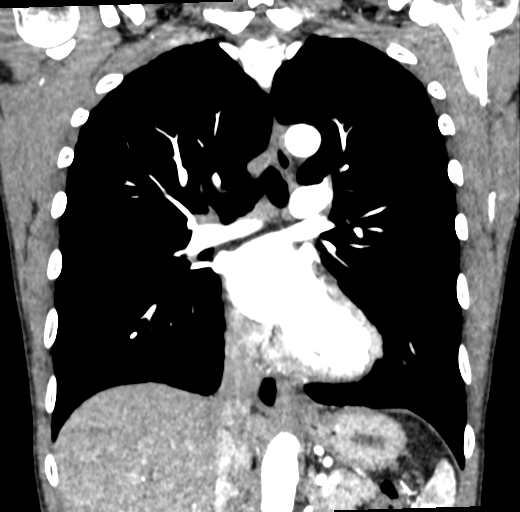
[im 77/139  lung]
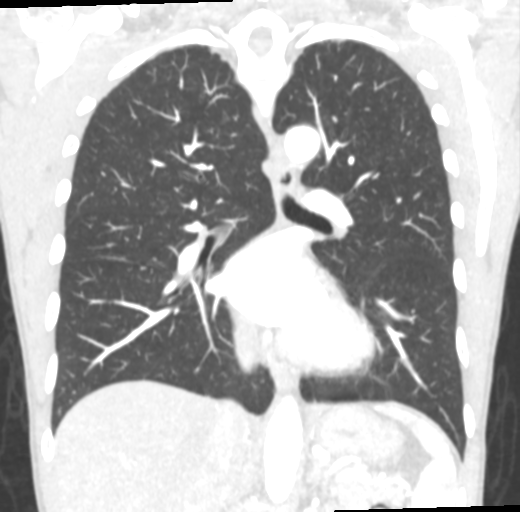
[im 83/139  mediastinal]
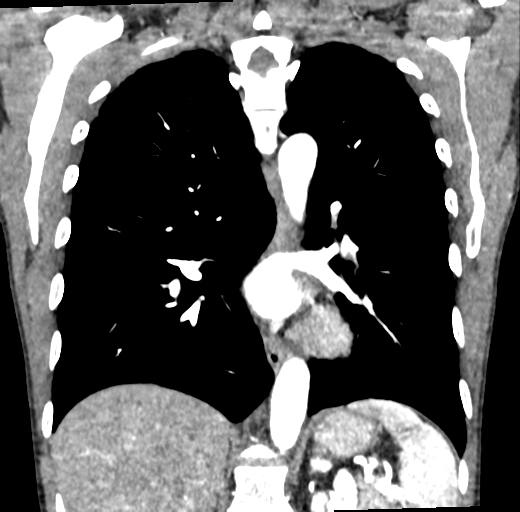
[im 87/139  lung]
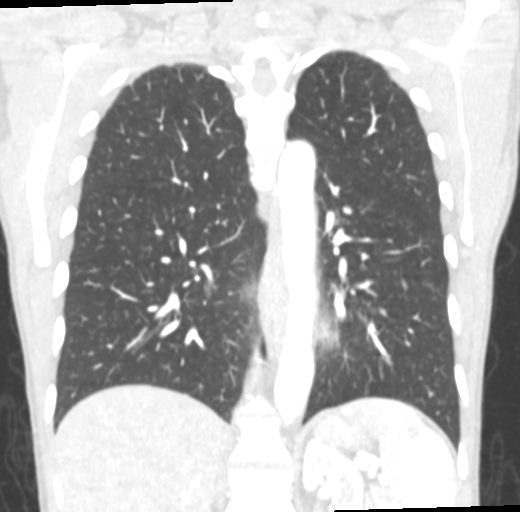
[im 98/139  mediastinal]
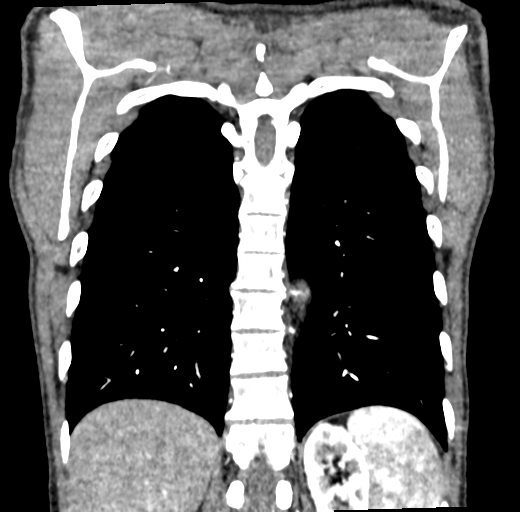
[im 103/139  lung]
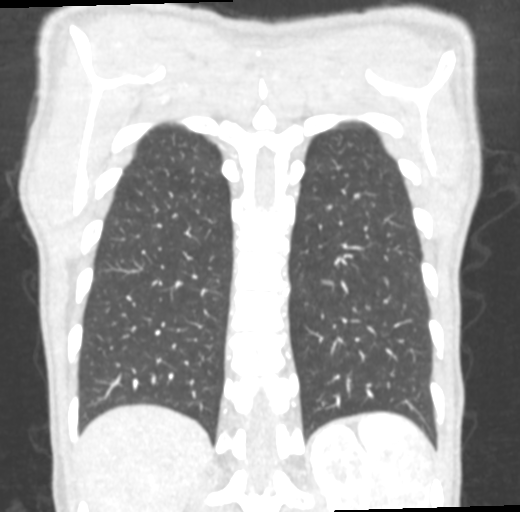
[im 111/139  mediastinal]
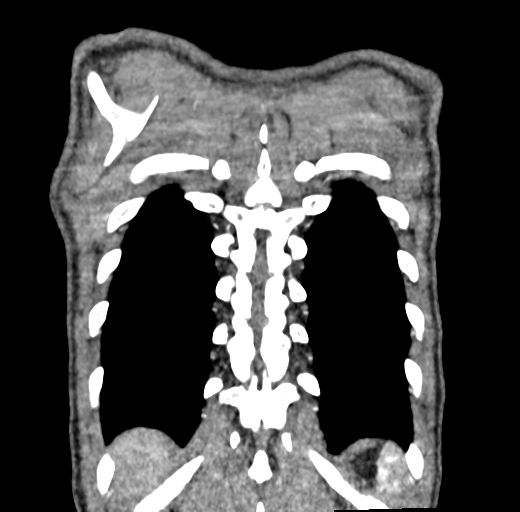
[im 118/139  lung]
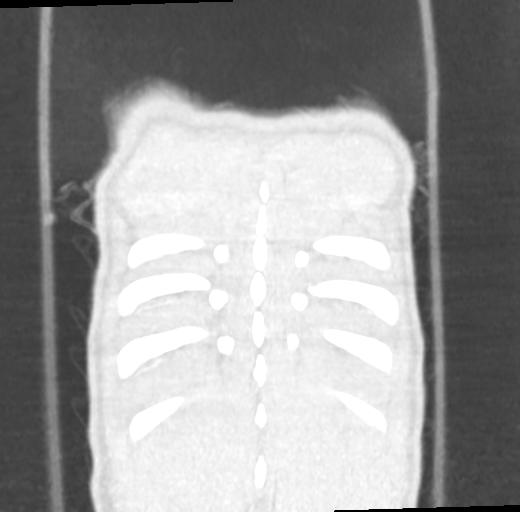
[im 123/139  mediastinal]
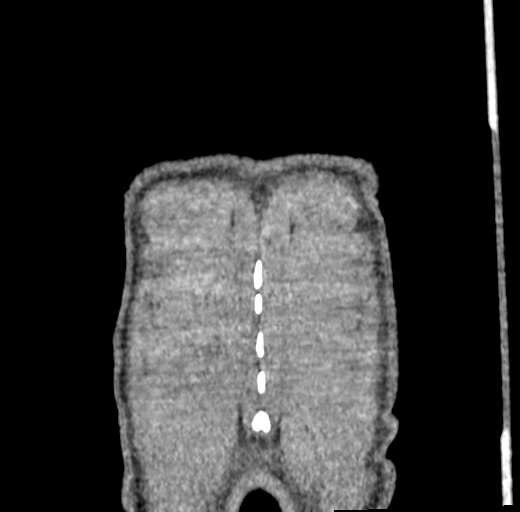
[im 133/139  lung]
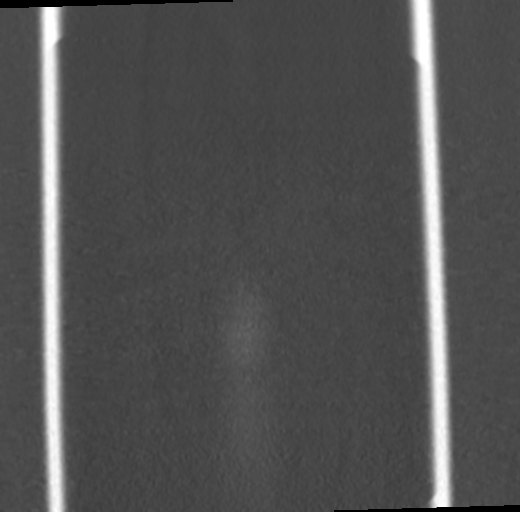

[19 of 32 positions shown; findings below may reference images not displayed]

RADIATION DOSE REDUCTION: This exam was performed according to the
departmental dose-optimization program which includes automated
exposure control, adjustment of the mA and/or kV according to
patient size and/or use of iterative reconstruction technique.

CONTRAST:  75mL [id number] IOPAMIDOL ([id number]) INJECTION 76%
FINDINGS: Cardiovascular: Postoperative changes of type A dissection repair
are again noted, with graft replacement of both the ascending
thoracic aorta, as well as the innominate artery and proximal right
common carotid and subclavian arteries. No unexpected fluid
collection in the mediastinum to suggest hematoma. Heart size is
normal. There is no significant pericardial fluid, thickening or
pericardial calcification. Atherosclerotic calcifications in the
descending thoracic aorta. No definite coronary artery
calcifications.

Mediastinum/Nodes: No pathologically enlarged mediastinal or hilar
lymph nodes. Esophagus is unremarkable in appearance. No axillary
lymphadenopathy.

Lungs/Pleura: No acute consolidative airspace disease. No pleural
effusions. No suspicious appearing pulmonary nodules or masses are
noted.

Upper Abdomen: Unremarkable.

Musculoskeletal: There are no aggressive appearing lytic or blastic
lesions noted in the visualized portions of the skeleton.

Review of the MIP images confirms the above findings.
IMPRESSION: 1. Postoperative changes of graft repair for prior type A aortic
dissection, with no evidence of recurrent aneurysm and no acute
complicating features, as above.
2. Aortic atherosclerosis.

Aortic Atherosclerosis ([id number]-S74.4).

## 2021-08-12 MED ORDER — IOPAMIDOL (ISOVUE-370) INJECTION 76%
75.0000 mL | Freq: Once | INTRAVENOUS | Status: AC | PRN
  Administered 2021-08-12: 75 mL via INTRAVENOUS

## 2021-08-18 ENCOUNTER — Ambulatory Visit: Payer: Medicaid Other | Admitting: Physician Assistant

## 2021-08-18 ENCOUNTER — Ambulatory Visit: Payer: Medicaid Other | Admitting: Thoracic Surgery (Cardiothoracic Vascular Surgery)

## 2021-08-18 ENCOUNTER — Encounter: Payer: Self-pay | Admitting: Thoracic Surgery (Cardiothoracic Vascular Surgery)

## 2021-08-18 VITALS — BP 131/97 | HR 86 | Resp 20 | Ht 76.0 in | Wt 142.0 lb

## 2021-08-18 DIAGNOSIS — Z9889 Other specified postprocedural states: Secondary | ICD-10-CM

## 2021-08-18 MED ORDER — CARVEDILOL 6.25 MG PO TABS
6.2500 mg | ORAL_TABLET | Freq: Two times a day (BID) | ORAL | 3 refills | Status: DC
Start: 2021-08-18 — End: 2021-09-16

## 2021-08-18 NOTE — Progress Notes (Signed)
? ?   ?[address] ?      [place] [address] ?            [phone]   ? ? ?HPI: Jesse [name] returns for follow-up after repair of a type II aortic dissection by Dr. Cornelius Moras ? ?Jesse [name] is a 46 year old man with a history of hypertension, type II aortic dissection, stroke, peripheral neuropathy, anxiety, and depression.  He presented in July 2022 with sudden onset of aphasia.  He was found to have a type II aortic dissection and underwent emergent repair by Dr. Cornelius Moras.  He had a complicated postoperative course but ultimately went to rehab and did well. ? ?He continues to have problems with paresthesias mostly in his feet.  He is on gabapentin for that.  Still some visual impairment.  Speech is fluent. ? ?Past Medical History:  ?Diagnosis Date  ? Anxiety   ? Depression   ? Essential hypertension   ? Myocardial infarction [facility])   ? 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 Straight graft replacement of ascending aorta and proximal transverse aortic arch with re-suspension of native aortic valve and open hemi arch distal anastomosis with aorta to right common carotid bypass and aorta to right subclavian bypass  ? Stroke [facility])   ? ? ?Current Outpatient Medications  ?Medication Sig Dispense Refill  ? acetaminophen (TYLENOL) 325 MG tablet Take 1-2 tablets (325-650 mg total) by mouth every 4 (four) hours as needed for mild pain.    ? amLODipine (NORVASC) 5 MG tablet Take 0.5 tablets (2.5 mg total) by mouth in the morning and at bedtime. 90 tablet 3  ? aspirin 81 MG chewable tablet Chew 1 tablet (81 mg total) by mouth daily.    ? gabapentin (NEURONTIN) 100 MG capsule Take 100 mg by mouth 2 (two) times daily.    ? gabapentin (NEURONTIN) 300 MG capsule Take 1 capsule (300 mg total) by mouth 3 (three) times daily. 90 capsule 1  ? losartan (COZAAR) 50 MG tablet Take 1 tablet (50 mg total) by mouth daily. 90 tablet 3  ? pantoprazole sodium (PROTONIX) 40 mg/20 mL PACK Dissolve 1 packet in 20 mLs (40 mg total)  and take by mouth daily. 30 mL 0  ? polyethylene glycol (MIRALAX / GLYCOLAX) 17 g packet Take 17 g by mouth daily as needed for moderate constipation. 14 each 0  ? QUEtiapine (SEROQUEL) 50 MG tablet TAKE 1 TABLET BY MOUTH AT BEDTIME. AND 1/2 TABLET DURING THE DAY AS NEEDED FOR AGITATION 135 tablet 1  ? rosuvastatin (CRESTOR) 20 MG tablet Take 1 tablet (20 mg total) by mouth daily. 30 tablet 11  ? tamsulosin (FLOMAX) 0.4 MG CAPS capsule Take 2 capsules (0.8 mg total) by mouth daily after supper. 90 capsule 1  ? Zinc Sulfate 220 (50 Zn) MG TABS Take 1 tablet (220 mg total) by mouth daily. 90 tablet 0  ? carvedilol (COREG) 6.25 MG tablet Take 1 tablet (6.25 mg total) by mouth 2 (two) times daily with a meal. 60 tablet 3  ? ferrous sulfate 325 (65 FE) MG tablet Take 1 tablet (325 mg total) by mouth daily. 30 tablet 3  ? mirtazapine (REMERON) 7.5 MG tablet Take 1 tablet (7.5 mg total) by mouth at bedtime. (Patient not taking: Reported on 08/18/2021) 30 tablet 0  ? ?No current facility-administered medications for this visit.  ? ? ?Physical Exam ?BP (!) 131/97 (BP Location: Right Arm, Patient Position: Sitting)   Pulse 86  Resp 20   Ht 6\' 4"  (1.93 m)   Wt 142 lb (64.4 kg)   SpO2 98% Comment: RA  BMI 17.83 kg/m?  ?45 year old man in no acute distress ?Alert and oriented x3 with no focal motor deficits ?Lungs clear bilaterally ?Cardiac regular rate and rhythm, no murmur ?No carotid bruits ?Equal radial pulses bilaterally ? ?Diagnostic Tests: ?CT ANGIOGRAPHY CHEST WITH CONTRAST ?  ?TECHNIQUE: ?Multidetector CT imaging of the chest was performed using the ?standard protocol during bolus administration of intravenous ?contrast. Multiplanar CT image reconstructions and MIPs were ?obtained to evaluate the vascular anatomy. ?  ?RADIATION DOSE REDUCTION: This exam was performed according to the ?departmental dose-optimization program which includes automated ?exposure control, adjustment of the mA and/or kV according  to ?patient size and/or use of iterative reconstruction technique. ?  ?CONTRAST:  39mL ISOVUE-370 IOPAMIDOL (ISOVUE-370) INJECTION 76% ?  ?COMPARISON:  CTA of the chest, abdomen and pelvis 02/05/2021. ?  ?FINDINGS: ?Cardiovascular: Postoperative changes of type A dissection repair ?are again noted, with graft replacement of both the ascending ?thoracic aorta, as well as the innominate artery and proximal right ?common carotid and subclavian arteries. No unexpected fluid ?collection in the mediastinum to suggest hematoma. Heart size is ?normal. There is no significant pericardial fluid, thickening or ?pericardial calcification. Atherosclerotic calcifications in the ?descending thoracic aorta. No definite coronary artery ?calcifications. ?  ?Mediastinum/Nodes: No pathologically enlarged mediastinal or hilar ?lymph nodes. Esophagus is unremarkable in appearance. No axillary ?lymphadenopathy. ?  ?Lungs/Pleura: No acute consolidative airspace disease. No pleural ?effusions. No suspicious appearing pulmonary nodules or masses are ?noted. ?  ?Upper Abdomen: Unremarkable. ?  ?Musculoskeletal: There are no aggressive appearing lytic or blastic ?lesions noted in the visualized portions of the skeleton. ?  ?Review of the MIP images confirms the above findings. ?  ?IMPRESSION: ?1. Postoperative changes of graft repair for prior type A aortic ?dissection, with no evidence of recurrent aneurysm and no acute ?complicating features, as above. ?2. Aortic atherosclerosis. ?  ?Aortic Atherosclerosis (ICD10-I70.0). ?  ?  ?Electronically Signed ?  By: 02/07/2021 M.D. ?  On: 08/12/2021 09:51 ?I personally reviewed the CT images.  Status post repair of a type II dissection.  No evidence of pseudoaneurysm.  Remainder of aorta appears normal with the exception of some aortic atherosclerosis. ? ?Impression: ?Jesse [name] is a 46 year old man with a history of hypertension, type II aortic dissection, stroke, peripheral neuropathy,  anxiety, and depression.  He presented with sudden onset aphasia back in July 2022.  He was found to have type II aortic dissection and underwent emergent repair. ? ?Type II aortic dissection- 9 months out from repair.  No issues with the repair apparent on CT.  Remainder of aorta appears normal. ? ?CVA with expressive aphasia and paresthesias-aphasia has improved.  Paresthesias persist.  He is on gabapentin for those. ? ?Hypertension-blood pressure elevated.  He is on low-dose Coreg and losartan.  I am going to increase his Coreg to 6.25 mg twice daily. ? ?Plan: ?Increase Coreg to 6.25 mg twice daily ?Return in 1 year with CT angio of chest ? ?August 2022, MD ?[facility] ?[phone] ? ? ? ?

## 2021-08-19 ENCOUNTER — Encounter: Payer: Self-pay | Admitting: Speech Pathology

## 2021-08-19 ENCOUNTER — Ambulatory Visit: Payer: Medicaid Other | Admitting: Speech Pathology

## 2021-08-19 DIAGNOSIS — R4701 Aphasia: Secondary | ICD-10-CM | POA: Diagnosis not present

## 2021-08-19 DIAGNOSIS — R482 Apraxia: Secondary | ICD-10-CM

## 2021-08-19 NOTE — Patient Instructions (Addendum)
? ?  Vernona Rieger [phone] ? ?Practice writing short phrases - say it aloud then write it ? ?Contact Dr. Jerrell Mylar if Nida Boatman is interested in seeing if he qualifies for the study or see if [facility] speech dept has anything to offer him  ? ?You look good, so people may forget that you had a stroke or not even understand  ? ?Great job letting others know that you need extra time to talk and have trouble getting your words out. This lets the other person know ahead of time and lets strangers know so they don't think you are on drugs or drunk or having an emergency ? ? ?You have 7 therapy visits left - come back in October and we can use the rest if he has them left ? ?Call and ask the doctor about lifting weights and what exercise you can do safely ? ?

## 2021-08-24 NOTE — Therapy (Signed)
[facility] ?[place] ?[address], [place], [address] ?Phone: [phone]   Fax:  [phone] ? ?Speech Language Pathology Treatment & Discharge Summary ? ?Patient Details  ?Name: Jesse [name] ?MRN: [id number] ?Date of Birth: 03-16-1976 ?Referring Provider (SLP): Dr. Dorna Mai ? ? ?Encounter Date: 08/19/2021 ? ? ? ?Past Medical History:  ?Diagnosis Date  ? Anxiety   ? Depression   ? Essential hypertension   ? Myocardial infarction [facility])   ? 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 Straight graft replacement of ascending aorta and proximal transverse aortic arch with re-suspension of native aortic valve and open hemi arch distal anastomosis with aorta to right common carotid bypass and aorta to right subclavian bypass  ? Stroke [facility])   ? ? ?Past Surgical History:  ?Procedure Laterality Date  ? ASCENDING AORTIC ROOT REPLACEMENT N/A 11/04/2020  ? Procedure: 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 WITH REPLACEMENT OF ASCENDING AORTA AND HEMIARCH USING HEMASHIELD PLATINUM 26MM GRAFT AND HEMASHIELD GOLD 14 X 8MM GRAFT, RESUSPENSION OF NATIVE VALVE, AORTA TO RIGHT CAROTID BYPASS, AORTA TO RIGHT SUBCLAVIAN BYPASS;  Surgeon: Rexene Alberts, MD;  Location: [place];  Service: Open Heart Surgery;  Laterality: N/A;  ? TEE WITHOUT CARDIOVERSION N/A 11/04/2020  ? Procedure: TRANSESOPHAGEAL ECHOCARDIOGRAM (TEE);  Surgeon: Rexene Alberts, MD;  Location: [place];  Service: Open Heart Surgery;  Laterality: N/A;  ? ? ?There were no vitals filed for this visit. ? ? Subjective Assessment - 08/24/21 0857   ? ? Subjective "He is doing OK"   ? Patient is accompained by: Family member   ? Currently in Pain? Yes   ? Pain Score 8    ? Pain Location Foot   ? Pain Orientation Right;Left   ? Pain Descriptors / Indicators Aching;Sharp;Throbbing   ? ?  ?  ? ?  ? ? ? ? ? ? ? ?SPEECH THERAPY DISCHARGE SUMMARY ? ?Visits from Start of Care: 15 ? ?Current functional level related to  goals / functional outcomes: ?See goals below ?  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  ?Education / Equipment: ?HEP for apraxia, compensations for aphasia, online and local resources for people with aphasia,  ? ?Patient agrees to discharge. Patient goals were met. Patient is being discharged due to meeting the stated rehab goals.. ? ? ? ? ? ? ? SLP Short Term Goals - 08/24/21 0857   ? ?  ? SLP SHORT TERM GOAL #1  ? Title pt will functionally name 7 members of his family/close friends correctly with modified independence in 3 session   ? Baseline 1; 01-19-21, 01-23-21, 01-26-21   ? Time 6   ? Status Achieved   ?  ? SLP SHORT TERM GOAL #2  ? Title Pt will ID and self correct paraphasias in structured task 8/10 with occasional min A   ? Baseline 50% ID errors on eval   ? Time 1   ? Period Weeks   ? Status Achieved   ?  ? SLP SHORT TERM GOAL #3  ? Title Pt will increase utterance legnth to 5-7 words with occasional min A   ? Baseline 3-4 words   ? Time 1   ? Period Weeks   ? Status Achieved   ?  ? SLP SHORT TERM GOAL #4  ? Title Pt will use compensations for aphasia 75% of anomic episodes   ? Baseline 50%; 06-15-21   ?  Time 1   ? Period Weeks   ? Status Achieved   ?  ? SLP SHORT TERM GOAL #5  ? Title pt will demo functional articulation (no neologism) in 3 simple conversational turns in 2 sessions   ? Time 6   ? Status Achieved   ? ?  ?  ? ?  ? ? ? SLP Long Term Goals - 08/24/21 0857   ? ?  ? SLP LONG TERM GOAL #1  ? Title In structured task, such as VNeST, pt will generate complex sentnece with occasional min A and extended time   ? Baseline simple phrases only on eval; 06/24/21   ? Time 5   ? Period Weeks   ? Status Achieved   ?  ? SLP LONG TERM GOAL #2  ? Title pt will functionally communicate in 5 minutes simple conversation using compensations in 2 sessions   ? Baseline 2 minutes; 06/22/21   ? Time 6   ? Period Weeks   ? Status Achieved   ?  ? SLP LONG TERM GOAL #3  ?  Title Spouse will use 2 strategies to A pt with auditory comprehension 3x   ? Baseline not demonstrated   ? Time 4   ? Period Weeks   ? Status Achieved   ?  ? SLP LONG TERM GOAL #4  ? Title Pt will write personal information and family names with occasional min A   ? Baseline his name only   ? Time 6   ? Period Weeks   ? Status Achieved   ?  ? SLP LONG TERM GOAL #5  ? Title Pt/spouse will improve score on Communication Participation Item Bank by 2 points   ? Baseline score of 9   ? Time 4   ? Period Weeks   ? Status Achieved   ?  ? SLP LONG TERM GOAL #6  ? Title Pt wil participate in 8 turns of conversation with occasional min A and 4 or less requests for clarification over 3 sessions   ? Baseline 4 -5 turns   ? Time 5   ? Period Weeks   ? Status Achieved   ? ?  ?  ? ?  ? ? ? Plan - 08/24/21 0858   ? ? Clinical Impression Statement Improving mildto  moderate aphasia affecting communication with family and friends. Reading at word and short phrase level improved with extended time. Verbal apraxia also improving with less halting and groping. Simple convesation improved to 6 turns with 5-8 word utterance length. Jesse [name] is IDing and attempting to correct paraphasias. At this time, goals have been met, and they wish to d/c due to limited medicaid visits. They are aware they can return later in the year if he has not needed to use remaining medicaid therapy visits. D/C ST, pt in agreement, Educated re: Restaurant manager, fast food. .   ? Speech Therapy Frequency 1x /week   ? Duration 8 weeks   ? Treatment/Interventions Compensatory techniques;Functional tasks;Multimodal communcation approach;Cueing hierarchy;SLP instruction and feedback;Language facilitation;Cognitive reorganization;Compensatory strategies;Internal/external aids;Patient/family education   ? Potential to Achieve Goals Good   ? Potential Considerations Financial resources   ? ?  ?  ? ?  ? ? ?Patient will benefit from skilled therapeutic intervention in order to  improve the following deficits and impairments:   ?Aphasia ? ?Verbal apraxia ? ? ? ?Problem List ?Patient Active Problem List  ? Diagnosis Date Noted  ? Altered sensation due to  recent stroke 02/19/2021  ? 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 AKI (acute kidney injury) ([place])   ? Benign essential HTN   ? Hyponatremia   ? Acute blood loss anemia   ? Urinary retention   ? Pressure injury of skin 11/17/2020  ? Respiratory arrest ([place]) 11/14/2020  ? Acute respiratory failure with hypoxia ([place]) 11/14/2020  ? Adverse effects of medication 11/14/2020  ? Hypotension 11/14/2020  ? Encounter for central line placement   ? Aortic dissection ([place]) 11/13/2020  ? Acute ischemic right MCA stroke ([place]) 11/13/2020  ? Agitation 11/13/2020  ? Atelectasis   ? Phlebitis   ? Polysubstance abuse ([place])   ? Elevated troponin 11/07/2020  ? New onset left bundle branch block (LBBB) 11/07/2020  ? Protein-calorie malnutrition, severe 11/07/2020  ? Encephalopathy acute   ? Cerebral infarction due to thrombosis of cerebral artery ([place])   ? Acute hypoxemic respiratory failure ([place])   ? Acute thoracic aortic dissection ([place]) 11/05/2020  ? 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 Cerebral embolism with cerebral infarction 11/05/2020  ? Essential hypertension   ? ? ?Darvin Dials, Annye Rusk, CCC-SLP ?08/24/2021, 9:00 AM ? ?[facility] ?[place] ?[address] [place], [place], [address] ?Phone: [phone]   Fax:  [phone] ? ? ?Name: Jesse [name] ?MRN: [id number] ?Date of Birth: 20-Nov-1975 ? ?

## 2021-08-25 ENCOUNTER — Encounter: Payer: Self-pay | Admitting: Family Medicine

## 2021-09-13 ENCOUNTER — Other Ambulatory Visit: Payer: Self-pay | Admitting: Internal Medicine

## 2021-09-14 ENCOUNTER — Telehealth: Payer: Self-pay | Admitting: Internal Medicine

## 2021-09-14 NOTE — Telephone Encounter (Signed)
 *  STAT* If patient is at the pharmacy, call can be transferred to refill team.   1. Which medications need to be refilled? (please list name of each medication and dose if known) carvedilol (COREG) 6.25 MG tablet   2. Which pharmacy/location (including street and city if local pharmacy) is medication to be sent to? [id number] - [place], [place] - [address]   3. Do they need a 30 day or 90 day supply? 90 days  

## 2021-09-16 MED ORDER — CARVEDILOL 6.25 MG PO TABS: 6.2500 mg | ORAL_TABLET | Freq: Two times a day (BID) | ORAL | 3 refills | Status: DC

## 2021-09-18 ENCOUNTER — Other Ambulatory Visit: Payer: Self-pay | Admitting: Physical Medicine & Rehabilitation

## 2021-09-22 ENCOUNTER — Encounter: Payer: Self-pay | Admitting: Family Medicine

## 2021-09-23 MED ORDER — TAMSULOSIN HCL 0.4 MG PO CAPS: 0.8000 mg | ORAL_CAPSULE | Freq: Every day | ORAL | 1 refills | Status: DC

## 2021-10-19 ENCOUNTER — Ambulatory Visit: Payer: Medicaid Other | Admitting: Adult Health

## 2021-10-19 ENCOUNTER — Encounter: Payer: Self-pay | Admitting: Adult Health

## 2021-10-19 VITALS — BP 112/82 | HR 83 | Ht 76.0 in | Wt 139.0 lb

## 2021-10-19 DIAGNOSIS — I69398 Other sequelae of cerebral infarction: Secondary | ICD-10-CM | POA: Diagnosis not present

## 2021-10-19 DIAGNOSIS — I63131 Cerebral infarction due to embolism of right carotid artery: Secondary | ICD-10-CM

## 2021-10-19 DIAGNOSIS — I69320 Aphasia following cerebral infarction: Secondary | ICD-10-CM

## 2021-10-19 DIAGNOSIS — R208 Other disturbances of skin sensation: Secondary | ICD-10-CM

## 2021-10-19 DIAGNOSIS — H547 Unspecified visual loss: Secondary | ICD-10-CM | POA: Diagnosis not present

## 2021-10-19 MED ORDER — GABAPENTIN 300 MG PO CAPS: ORAL_CAPSULE | ORAL | 5 refills | Status: DC

## 2021-10-19 NOTE — Progress Notes (Signed)
[facility] [address] [place]. [place] [address] [phone]       STROKE FOLLOW UP NOTE  [name] Date of Birth:  1975/11/19 Medical Record Number:  [id number]   Reason for Referral: stroke follow up    SUBJECTIVE:   CHIEF COMPLAINT:  Chief Complaint  Patient presents with   Follow-up    Rm 3 with spouse Jesse [name]  Pt is well and stable, no new concerns     HPI:  Update 10/19/2021 JM: Patient returns for stroke follow-up after prior visit 5 months ago.  Accompanied by his wife.  Overall stable without new stroke/TIA symptoms.  Completed SLP back in April and PT back in March.  Reports residual aphasia with some improvement since prior visit.  Plans on restarting SLP around October due to limited visits. Visual impairment persistent since prior visit, has appt with Dr. Dione Booze 6/28.  Paresthesias persist now in both legs up to knees and right hand.  On gabapentin 300mg  TID - no noticeable benefit . Dr. Wynn Banker recommended pursuing EMG/NCV for right hand symptoms but patient did not pursue as he was more concerned regarding feet pain. He has not yet has f/u visit. Due to this pain, his overall ambulation and gait has been impacted. Mood has not been great, has not slept well and decreased appetite especially over the past 2 weeks. He has not yet been seen by [facility].   Compliant on aspirin and Crestor, denies side effects.  Blood pressure today 112/82.  Continues to follow closely with cardiothoracic, cardiology and PCP.   No further concerns at this time     History provided for reference purposes only Update 05/20/2021 JM: Returns for 77-month stroke follow-up accompanied by his wife. Overall doing well without new stroke/TIA symptoms. Residual aphasia improving, recently restarted SLP.  Residual gait impairment with imbalance gradually improving, recently restarted PT.  Denies any recent falls. Visual impairment persistent since prior visit - reports  limited vision upper and lower quadrants bilaterally.  Occasional double vision.  Paresthesias continued dysesthesias left leg and right hand - can be worse after prolonged ambulation or standing - occasionally painful - placed on gabapentin 100mg  BID - only taking 1 cap at night due to fatigue on BID dosing - believes some improvement. Compliant on aspirin and Crestor without side effects.  Blood pressure today 112/83.  Routinely monitors at home and typically stable.  Routinely follows with PCP and cardiology. Eval by genetics counselor 01/2021 currently awaiting genetic testing to assess for connective tissue disorder.  Reports complete tobacco cessation since prior visit.  No further concerns at this time  Initial visit 02/03/2021 JM: Patient is being seen for hospital follow-up accompanied by his wife who provides majority of history  Reports residual:  Expressive aphasia -has been gradually improving.  Currently working with SLP. Per wife, able to participate in more conversations Denies residual receptive aphasia  Gait impairment with imbalance -gradually improving.  Currently working with PT Pins/needles right hand and left foot - painful and sensitive. Wife questioning if this is interfering with imbalance Vision impairment - continued difficulty seeing the ground or reading. Will have to tilt his head to help compensate.   Worsening depression/anxiety: started on mirtazapine by PCP but not yet started (due to concern of interaction with Seroquel).  Remains on Seroquel 25 mg nightly and 25 mg as needed (but has not recently needed) Reports good appetite and no swallowing difficulties  Previously working as a [employer] and  was not covered for short-term or long-term disability.  Wife is in the process of applying for Social Security disability.  He has since been approved for Medicaid.  Compliant on aspirin and Crestor without side effects.  Blood pressure today 106/64.  Routinely  monitors at home per wife, has been on the lower side especially after medications.  No further concerns at this time   Stroke admission 11/04/2020 [name] is a 46 y.o. male with history of anxiety and longstanding tobacco and alcohol use, not seeing doctor for 22 years admitted on 11/04/2020 for chest pain, confusion, headache, blurry vision and aphasia.  Initial CT head unremarkable.  CTA found to have aortic dissection requiring emergent repair by Dr. Cornelius Moras.  Complicated hospital course with CT head showed relatively large infarct with cytotoxic edema in posterior right MCA and MCA/PCA watershed territories with development of petechial hemorrhage. MRI large right MCA infarct with mild cytotoxic edema and rightward midline shift with tiny punctate bilateral cerebral and cerebellar embolic infarcts.  MRA head/neck interval revascularization of right ICA and MCA and right aorto to subclavian bypass with patent right carotid artery.  EF 55 to 60%.  On 7/15, patient increasing O2 requirements and reintubated with postop course additionally complicated by acute blood loss anemia, coagulopathy, left antecubital superficial venous thrombosis (started on Keflex, Vanco and heparin drip) and persistent hyperactive delirium requiring Precedex and eventually transition to Klonopin, Seroquel, valproic acid and Ativan as needed.  EEG 7/16 focal right hemispheric slowing and mild generalized slowing but no definite epileptiform activity.  LDL 49.  A1c 5.2.  Started on aspirin 81 mg daily per CTS. On 7/20, rapid response called for anisicoria.  Repeat CT head 7/20 interval development of petechial hemorrhage associated with evolving posterior right MCA and MCA/PCA watershed territory infarct with cytotoxic edema and mass-effect mildly progressed with approximately 4 mm of leftward midline shift at the foramen of Monro (previously 3 mm when remeasured).   Eventually transferred to CIR on 7/21 as mobility declining  any more scheduled rehab routinely improved delirium.  On 7/23, patient found to be apneic with pulse and blood pressure 72/54 and unresponsive and code initiated requiring reintubation likely due to aspiration pneumonia as well as COVID-positive state.  Extubated 7/23 and repeat CT head showed evolving right MCA territory infarct with decreasing edema.  Poor p.o. intake therefore core track placed.  Continue to have issues with poor attention, global aphasia with cognitive deficits and unsteady gait and was transferred back to CIR on 8/4.  During CIR admission, mood stable and weaned off Seroquel, renal status improved, urinary retention requiring Foley placement, improvement of p.o. intake with tube feeds discontinued 8/6 on dysphagia 2 diet and completed course of antibiotics for COVID-pneumonia.  Is eventually discharged home on 12/11/2020 with home health therapies.          PERTINENT IMAGING  CT HEAD 11/04/2020 IMPRESSION: No acute intracranial hemorrhage. Question subtle small loss of gray-white differentiation in the right insular region (ASPECT score is 9).  11/06/2020 IMPRESSION: 1. Relatively large infarct with cytotoxic edema in the posterior Right MCA and MCA/PCA watershed territories. Mild mass effect including trace leftward midline shift. No hemorrhagic transformation. 2. Preserved gray-white matter differentiation elsewhere.  11/12/2020 IMPRESSION: In comparison to CT head from 11/06/2020, interval development of petechial hemorrhage associated with the evolving posterior right MCA and MCA/PCA watershed territory infarct. No mass occupying hemorrhagic transformation. Cytotoxic edema and mass effect is mildly progressed with approximately 4 mm of leftward midline  shift at the foramen of Monro (previously 3 mm when remeasured).  11/14/2020 IMPRESSION: Acute infarct in the posterior right MCA territory. There is progressive gyral intermediate density throughout the  infarct with significant change from 2 days ago. It is unclear if this is evolution of infarct with laminar necrosis versus petechial hemorrhage related to hemorrhagic transformation. Differentiation of these 2 findings could be best performed with MRI.  11/18/2020 IMPRESSION: Evolving posterior right MCA territory infarction with decreased edema and mild mass effect. No new hemorrhage.   New acute or subacute small bilateral cerebellar infarcts. Some may have been subtly present on the prior CT.   CT ANGIO  IMPRESSION: Partially imaged type A aortic dissection involving the ascending aorta and transverse portion but not the descending aorta.   Dissection involves innominate artery with thrombosis of false lumen. This extends into the right common carotid with narrowing of the true lumen to a minimal diameter of 1.5 mm. Minimal enhancement within the right cervical and intracranial ICA.   Reconstitution at the right ICA terminus. No proximal intracranial vessel occlusion. 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   MR BRAIN MRA HEAD/NECK IMPRESSION: MRI HEAD IMPRESSION: 1. 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 Associated regional mass effect with 5 mm of right-to-left shift, slightly increased from prior. 2. Additional scattered subcentimeter acute ischemic infarcts involving the bilateral cerebral hemispheres and right cerebellum, embolic in nature. No associated hemorrhage or mass effect. 3. No other acute intracranial abnormality.   MRA HEAD IMPRESSION: 1. Interval re-establishment of flow within the right ICA, now patent to the terminus. Right MCA well perfused, with no visible persistent downstream occlusion evident by MRA. 2. Otherwise stable and unremarkable intracranial MRA.    MRA NECK IMPRESSION: 1. 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 2. Right carotid artery system supplied via the right subclavian bypass, and is now widely patent without residual dissection or other abnormality. 3. Left carotid artery system supplied via the native aortic arch, and remains widely patent without stenosis or other abnormality. 4. 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 Follow-up examination with dedicated CTA could be performed for further evaluation of this finding as warranted. 5. Both vertebral arteries remain widely patent within the neck. Right vertebral artery dominant.      ROS:   14 system review of systems performed and negative with exception of those listed in HPI  PMH:  Past Medical History:  Diagnosis Date   Anxiety    Depression    Essential hypertension    Myocardial infarction (HCC)    S/P aortic dissection repair 11/05/2020   Straight graft replacement of ascending aorta and proximal transverse aortic arch with re-suspension of native aortic valve and open hemi arch distal anastomosis with aorta to right common carotid bypass and aorta to right subclavian bypass   Stroke (HCC)     PSH:  Past Surgical History:  Procedure Laterality Date   ASCENDING AORTIC ROOT REPLACEMENT N/A 11/04/2020   Procedure: 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 WITH REPLACEMENT OF ASCENDING AORTA AND HEMIARCH USING HEMASHIELD PLATINUM GRAFT AND HEMASHIELD GOLD 14 X GRAFT, RESUSPENSION OF NATIVE VALVE, AORTA TO RIGHT CAROTID BYPASS, AORTA TO RIGHT SUBCLAVIAN BYPASS;  Surgeon: Cornelius Moras,  Salvatore Decent, MD;  Location: [facility] OR;  Service: Open Heart Surgery;  Laterality: N/A;   TEE WITHOUT CARDIOVERSION N/A 11/04/2020   Procedure: TRANSESOPHAGEAL ECHOCARDIOGRAM (TEE);  Surgeon: Purcell Nails,  MD;  Location: [facility] OR;  Service: Open Heart Surgery;  Laterality: N/A;    Social History:  Social History   Socioeconomic History   Marital status: Married    Spouse name: Not on file   Number of children: Not on file   Years of education: Not on file   Highest education level: 9th grade  Occupational History   Not on file  Tobacco Use   Smoking status: Former    Types: Cigarettes    Quit date: 11/04/2020    Years since quitting: 0.9   Smokeless tobacco: Never  Vaping Use   Vaping Use: Never used  Substance and Sexual Activity   Alcohol use: Yes    Alcohol/week: 2.0 standard drinks of alcohol    Types: 2 Cans of beer per week   Drug use: Not Currently   Sexual activity: Yes  Other Topics Concern   Not on file  Social History Narrative   Not on file   Social Determinants of Health   Financial Resource Strain: Not on file  Food Insecurity: Not on file  Transportation Needs: Not on file  Physical Activity: Not on file  Stress: Not on file  Social Connections: Not on file  Intimate Partner Violence: Not on file    Family History: History reviewed. No pertinent family history.  Medications:   Current Outpatient Medications on File Prior to Visit  Medication Sig Dispense Refill   acetaminophen (TYLENOL) 325 MG tablet Take 1-2 tablets (325-650 mg total) by mouth every 4 (four) hours as needed for mild pain.     amLODipine (NORVASC) 5 MG tablet Take 0.5 tablets (2.5 mg total) by mouth in the morning and at bedtime. 90 tablet 3   aspirin 81 MG chewable tablet Chew 1 tablet (81 mg total) by mouth daily.     carvedilol (COREG) 6.25 MG tablet Take 1 tablet (6.25 mg total) by mouth 2 (two) times daily with a meal. 90 tablet 3   gabapentin (NEURONTIN) 300 MG capsule TAKE 1 CAPSULE BY MOUTH THREE TIMES A DAY 90 capsule 1   losartan (COZAAR) 50 MG tablet Take 1 tablet (50 mg total) by mouth daily. 90 tablet 3   pantoprazole sodium (PROTONIX) 40 mg/20 mL PACK Dissolve 1 packet in  20 mLs (40 mg total) and take by mouth daily. 30 mL 0   polyethylene glycol (MIRALAX / GLYCOLAX) 17 g packet Take 17 g by mouth daily as needed for moderate constipation. 14 each 0   QUEtiapine (SEROQUEL) 50 MG tablet TAKE 1 TABLET BY MOUTH AT BEDTIME. AND 1/2 TABLET DURING THE DAY AS NEEDED FOR AGITATION 135 tablet 1   rosuvastatin (CRESTOR) 20 MG tablet Take 1 tablet (20 mg total) by mouth daily. 30 tablet 11   tamsulosin (FLOMAX) 0.4 MG CAPS capsule Take 2 capsules (0.8 mg total) by mouth daily after supper. 90 capsule 1   Zinc Sulfate 220 (50 Zn) MG TABS Take 1 tablet (220 mg total) by mouth daily. 90 tablet 0   ferrous sulfate 325 (65 FE) MG tablet Take 1 tablet (325 mg total) by mouth daily. 30 tablet 3   No current facility-administered medications on file prior to visit.    Allergies:  No Known Allergies    OBJECTIVE:  Physical Exam  Vitals:  10/19/21 1107  BP: 112/82  Pulse: 83  Weight: 139 lb (63 kg)  Height: 6\' 4"  (1.93 m)   Body mass index is 16.92 kg/m. No results found.   General: Frail very pleasant middle-age Caucasian male, seated, in no evident distress Head: head normocephalic and atraumatic.   Neck: supple with no carotid or supraclavicular bruits Cardiovascular: regular rate and rhythm, no murmurs Musculoskeletal: no deformity Skin:  no rash/petichiae Vascular:  Normal pulses all extremities   Neurologic Exam Mental Status: Awake and fully alert. Moderate expressive aphasia. Able to speak in short sentences.  Follows commands without difficulty.  Oriented to place and time. Recent memory mildly impaired and remote memory intact. Attention span, concentration and fund of knowledge appropriate. Mood and affect appropriate.  Cranial Nerves: Pupils equal, briskly reactive to light. Extraocular movements full without nystagmus. OD turns in with vertical movements and turns out with convergence. Visual fields lower visual field impairment bilaterally. Hearing  intact. Facial sensation intact. Face, tongue, palate moves normally and symmetrically.  Motor: Normal bulk and tone. Normal strength in all tested extremity muscles Sensory.: intact to touch , pinprick , position and vibratory sensation.  Subjective right hand and bilateral LE dysesthesias  Coordination: Rapid alternating movements normal in all extremities. Finger-to-nose and heel-to-shin performed accurately bilaterally. Gait and Station: Arises from chair without difficulty. Stance is normal. Gait demonstrates normal stride length and mild imbalance with slight favoring of leg leg without use of assistive device.  Mild difficulty with tandem walk and heel/toe.  Reflexes: 1+ and symmetric. Toes downgoing.          ASSESSMENT: Jesse [name] is a 46 y.o. 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 Vascular risk factors include HTN, hx of tobacco and EtOH use, and s/p aortic dissection repair.      PLAN:  Right MCA/PCA infarct:  Residual deficit: Expressive aphasia, gait impairment with imbalance, visual deficit and dysesthesias. Increase gabapentin night time dose to 600mg  and continue 300mg  twice daily. Advised to schedule f/u with PMR for further management. Discussed establishing care with [facility] for depression and insomnia concerns. F/u with Dr. Dione Booze 6/28. Plans on restart therapies in October  Continue aspirin 81 mg daily  and Crestor 20 mg daily for secondary stroke prevention.   Discussed secondary stroke prevention measures and importance of close PCP follow up for aggressive stroke risk factor management including HTN with BP goal<130/90 and HLD with LDL goal<70.  Has not had recent lab work - patient declined having completed today. Advised to schedule f/u with PCP to complete lab work including A1c, lipid panel, B12, vit D and TSH (for  reversible causes of numbness especially in b/l LE now) I have gone over the pathophysiology of stroke, warning signs and symptoms, risk factors and their management in some detail with instructions to go to the closest emergency room for symptoms of concern.  S/p aortic dissection repair: Routinely followed by cardiology.  Released from cardiothoracic surgery.  Currently being worked up for marfanoid or connective tissue disorder - awaiting genetic testing     Follow up in 6 months or call earlier if needed   CC:  PCP: Georganna Skeans, MD    I spent 37 minutes of face-to-face and non-face-to-face time with patient and wife.  This included previsit chart review, lab review, study review, order entry, electronic health record documentation, patient and wife  education regarding prior stroke with residual deficits, secondary stroke prevention measures and importance of managing stroke risk factors and answered all other questions to patient and wife's satisfaction  Ihor Austin, AGNP-BC  [facility] [address] [place], [place] [address]  Phone [phone] Fax [phone] Note: This document was prepared with digital dictation and possible smart phrase technology. Any transcriptional errors that result from this process are unintentional.

## 2021-10-20 DIAGNOSIS — I71011 Dissection of aortic arch: Secondary | ICD-10-CM | POA: Diagnosis not present

## 2021-10-20 DIAGNOSIS — Z681 Body mass index (BMI) 19 or less, adult: Secondary | ICD-10-CM | POA: Diagnosis not present

## 2021-10-20 DIAGNOSIS — F17211 Nicotine dependence, cigarettes, in remission: Secondary | ICD-10-CM | POA: Diagnosis not present

## 2021-10-20 DIAGNOSIS — G4700 Insomnia, unspecified: Secondary | ICD-10-CM | POA: Diagnosis not present

## 2021-10-20 DIAGNOSIS — I10 Essential (primary) hypertension: Secondary | ICD-10-CM | POA: Diagnosis not present

## 2021-10-20 DIAGNOSIS — F419 Anxiety disorder, unspecified: Secondary | ICD-10-CM | POA: Diagnosis not present

## 2021-10-20 DIAGNOSIS — N401 Enlarged prostate with lower urinary tract symptoms: Secondary | ICD-10-CM | POA: Diagnosis not present

## 2021-10-20 DIAGNOSIS — K219 Gastro-esophageal reflux disease without esophagitis: Secondary | ICD-10-CM | POA: Diagnosis not present

## 2021-10-20 DIAGNOSIS — D649 Anemia, unspecified: Secondary | ICD-10-CM | POA: Diagnosis not present

## 2021-10-20 DIAGNOSIS — E782 Mixed hyperlipidemia: Secondary | ICD-10-CM | POA: Diagnosis not present

## 2021-10-20 DIAGNOSIS — G9009 Other idiopathic peripheral autonomic neuropathy: Secondary | ICD-10-CM | POA: Diagnosis not present

## 2021-10-20 DIAGNOSIS — G4489 Other headache syndrome: Secondary | ICD-10-CM | POA: Diagnosis not present

## 2021-10-28 ENCOUNTER — Ambulatory Visit: Payer: Medicaid Other | Admitting: Family Medicine

## 2021-11-17 ENCOUNTER — Other Ambulatory Visit: Payer: Self-pay | Admitting: Physical Medicine & Rehabilitation

## 2021-11-27 ENCOUNTER — Emergency Department (HOSPITAL_COMMUNITY): Payer: 59

## 2021-11-27 ENCOUNTER — Inpatient Hospital Stay (HOSPITAL_COMMUNITY)
Admission: EM | Admit: 2021-11-27 | Discharge: 2021-11-30 | DRG: 101 | Disposition: A | Discharge status: Home / Self Care | Payer: 59 | Attending: Internal Medicine | Admitting: Internal Medicine

## 2021-11-27 DIAGNOSIS — F419 Anxiety disorder, unspecified: Secondary | ICD-10-CM | POA: Diagnosis present

## 2021-11-27 DIAGNOSIS — E7220 Disorder of urea cycle metabolism, unspecified: Secondary | ICD-10-CM | POA: Diagnosis not present

## 2021-11-27 DIAGNOSIS — Z7982 Long term (current) use of aspirin: Secondary | ICD-10-CM | POA: Diagnosis not present

## 2021-11-27 DIAGNOSIS — G40901 Epilepsy, unspecified, not intractable, with status epilepticus: Principal | ICD-10-CM | POA: Diagnosis present

## 2021-11-27 DIAGNOSIS — I69398 Other sequelae of cerebral infarction: Secondary | ICD-10-CM

## 2021-11-27 DIAGNOSIS — I700 Atherosclerosis of aorta: Secondary | ICD-10-CM | POA: Diagnosis not present

## 2021-11-27 DIAGNOSIS — H539 Unspecified visual disturbance: Secondary | ICD-10-CM | POA: Diagnosis present

## 2021-11-27 DIAGNOSIS — R69 Illness, unspecified: Secondary | ICD-10-CM | POA: Diagnosis not present

## 2021-11-27 DIAGNOSIS — K219 Gastro-esophageal reflux disease without esophagitis: Secondary | ICD-10-CM | POA: Diagnosis present

## 2021-11-27 DIAGNOSIS — R404 Transient alteration of awareness: Secondary | ICD-10-CM | POA: Diagnosis not present

## 2021-11-27 DIAGNOSIS — N179 Acute kidney failure, unspecified: Secondary | ICD-10-CM | POA: Diagnosis not present

## 2021-11-27 DIAGNOSIS — I69320 Aphasia following cerebral infarction: Secondary | ICD-10-CM

## 2021-11-27 DIAGNOSIS — Q272 Other congenital malformations of renal artery: Secondary | ICD-10-CM | POA: Diagnosis not present

## 2021-11-27 DIAGNOSIS — F32A Depression, unspecified: Secondary | ICD-10-CM | POA: Diagnosis present

## 2021-11-27 DIAGNOSIS — I2510 Atherosclerotic heart disease of native coronary artery without angina pectoris: Secondary | ICD-10-CM | POA: Diagnosis present

## 2021-11-27 DIAGNOSIS — R4701 Aphasia: Secondary | ICD-10-CM | POA: Diagnosis not present

## 2021-11-27 DIAGNOSIS — Z79899 Other long term (current) drug therapy: Secondary | ICD-10-CM | POA: Diagnosis not present

## 2021-11-27 DIAGNOSIS — I252 Old myocardial infarction: Secondary | ICD-10-CM

## 2021-11-27 DIAGNOSIS — I10 Essential (primary) hypertension: Secondary | ICD-10-CM | POA: Diagnosis present

## 2021-11-27 DIAGNOSIS — R531 Weakness: Secondary | ICD-10-CM | POA: Diagnosis not present

## 2021-11-27 DIAGNOSIS — R471 Dysarthria and anarthria: Secondary | ICD-10-CM | POA: Diagnosis not present

## 2021-11-27 DIAGNOSIS — Z9889 Other specified postprocedural states: Secondary | ICD-10-CM | POA: Diagnosis not present

## 2021-11-27 DIAGNOSIS — R569 Unspecified convulsions: Secondary | ICD-10-CM | POA: Diagnosis not present

## 2021-11-27 DIAGNOSIS — Z87891 Personal history of nicotine dependence: Secondary | ICD-10-CM | POA: Diagnosis not present

## 2021-11-27 DIAGNOSIS — I69322 Dysarthria following cerebral infarction: Secondary | ICD-10-CM | POA: Diagnosis not present

## 2021-11-27 DIAGNOSIS — R4020 Unspecified coma: Secondary | ICD-10-CM | POA: Diagnosis not present

## 2021-11-27 DIAGNOSIS — I6522 Occlusion and stenosis of left carotid artery: Secondary | ICD-10-CM | POA: Diagnosis not present

## 2021-11-27 DIAGNOSIS — R29818 Other symptoms and signs involving the nervous system: Secondary | ICD-10-CM | POA: Diagnosis not present

## 2021-11-27 LAB — I-STAT CHEM 8, ED
BUN: 13 mg/dL (ref 6–20)
Calcium, Ion: 1.17 mmol/L (ref 1.15–1.40)
Chloride: 104 mmol/L (ref 98–111)
Creatinine, Ser: 1.1 mg/dL (ref 0.61–1.24)
Glucose, Bld: 135 mg/dL — ABNORMAL HIGH (ref 70–99)
HCT: 50 % (ref 39.0–52.0)
Hemoglobin: 17 g/dL (ref 13.0–17.0)
Potassium: 4.5 mmol/L (ref 3.5–5.1)
Sodium: 139 mmol/L (ref 135–145)
TCO2: 21 mmol/L — ABNORMAL LOW (ref 22–32)

## 2021-11-27 LAB — DIFFERENTIAL
Abs Immature Granulocytes: 0.08 10*3/uL — ABNORMAL HIGH (ref 0.00–0.07)
Basophils Absolute: 0.1 10*3/uL (ref 0.0–0.1)
Basophils Relative: 1 %
Eosinophils Absolute: 0.3 10*3/uL (ref 0.0–0.5)
Eosinophils Relative: 2 %
Immature Granulocytes: 1 %
Lymphocytes Relative: 20 %
Lymphs Abs: 2.2 10*3/uL (ref 0.7–4.0)
Monocytes Absolute: 0.5 10*3/uL (ref 0.1–1.0)
Monocytes Relative: 5 %
Neutro Abs: 7.8 10*3/uL — ABNORMAL HIGH (ref 1.7–7.7)
Neutrophils Relative %: 71 %

## 2021-11-27 LAB — PROTIME-INR
INR: 1.1 (ref 0.8–1.2)
Prothrombin Time: 13.8 seconds (ref 11.4–15.2)

## 2021-11-27 LAB — URINALYSIS, ROUTINE W REFLEX MICROSCOPIC
Bacteria, UA: NONE SEEN
Bilirubin Urine: NEGATIVE
Glucose, UA: NEGATIVE mg/dL
Ketones, ur: NEGATIVE mg/dL
Leukocytes,Ua: NEGATIVE
Nitrite: NEGATIVE
Protein, ur: 30 mg/dL — AB
Specific Gravity, Urine: 1.016 (ref 1.005–1.030)
pH: 5 (ref 5.0–8.0)

## 2021-11-27 LAB — RAPID URINE DRUG SCREEN, HOSP PERFORMED
Amphetamines: NOT DETECTED
Barbiturates: NOT DETECTED
Benzodiazepines: POSITIVE — AB
Cocaine: NOT DETECTED
Opiates: POSITIVE — AB
Tetrahydrocannabinol: NOT DETECTED

## 2021-11-27 LAB — COMPREHENSIVE METABOLIC PANEL
ALT: 16 U/L (ref 0–44)
AST: 30 U/L (ref 15–41)
Albumin: 4 g/dL (ref 3.5–5.0)
Alkaline Phosphatase: 62 U/L (ref 38–126)
Anion gap: 15 (ref 5–15)
BUN: 13 mg/dL (ref 6–20)
CO2: 18 mmol/L — ABNORMAL LOW (ref 22–32)
Calcium: 9.1 mg/dL (ref 8.9–10.3)
Chloride: 104 mmol/L (ref 98–111)
Creatinine, Ser: 1.45 mg/dL — ABNORMAL HIGH (ref 0.61–1.24)
GFR, Estimated: 60 mL/min — ABNORMAL LOW (ref >60)
Glucose, Bld: 141 mg/dL — ABNORMAL HIGH (ref 70–99)
Potassium: 4.5 mmol/L (ref 3.5–5.1)
Sodium: 137 mmol/L (ref 135–145)
Total Bilirubin: 0.8 mg/dL (ref 0.3–1.2)
Total Protein: 6.9 g/dL (ref 6.5–8.1)

## 2021-11-27 LAB — APTT: aPTT: 26 seconds (ref 24–36)

## 2021-11-27 LAB — CBC
HCT: 49 % (ref 39.0–52.0)
Hemoglobin: 16.1 g/dL (ref 13.0–17.0)
MCH: 32.3 pg (ref 26.0–34.0)
MCHC: 32.9 g/dL (ref 30.0–36.0)
MCV: 98.2 fL (ref 80.0–100.0)
Platelets: 164 10*3/uL (ref 150–400)
RBC: 4.99 MIL/uL (ref 4.22–5.81)
RDW: 12.4 % (ref 11.5–15.5)
WBC: 10.8 10*3/uL — ABNORMAL HIGH (ref 4.0–10.5)
nRBC: 0 % (ref 0.0–0.2)

## 2021-11-27 LAB — ETHANOL: Alcohol, Ethyl (B): <10 mg/dL (ref <10)

## 2021-11-27 MED ORDER — ADULT MULTIVITAMIN W/MINERALS CH
1.0000 | ORAL_TABLET | Freq: Every day | ORAL | Status: DC
  Administered 2021-11-28 – 2021-11-30 (×3): 1 via ORAL
  Filled 2021-11-27 (×3): qty 1

## 2021-11-27 MED ORDER — LEVETIRACETAM IN NACL 500 MG/100ML IV SOLN
500.0000 mg | Freq: Two times a day (BID) | INTRAVENOUS | Status: DC
  Filled 2021-11-27: qty 100

## 2021-11-27 MED ORDER — LORAZEPAM 2 MG/ML IJ SOLN
1.0000 mg | INTRAMUSCULAR | Status: DC | PRN
  Filled 2021-11-27 (×2): qty 1

## 2021-11-27 MED ORDER — LEVETIRACETAM 500 MG PO TABS: 500.0000 mg | ORAL_TABLET | Freq: Two times a day (BID) | ORAL | Status: DC

## 2021-11-27 MED ORDER — PANTOPRAZOLE SODIUM 40 MG PO TBEC
40.0000 mg | DELAYED_RELEASE_TABLET | Freq: Every day | ORAL | Status: DC
  Administered 2021-11-28 – 2021-11-30 (×3): 40 mg via ORAL
  Filled 2021-11-27 (×3): qty 1

## 2021-11-27 MED ORDER — SODIUM CHLORIDE 0.9% FLUSH
3.0000 mL | Freq: Two times a day (BID) | INTRAVENOUS | Status: DC
  Administered 2021-11-28 – 2021-11-30 (×4): 3 mL via INTRAVENOUS

## 2021-11-27 MED ORDER — ADULT MULTIVITAMIN W/MINERALS CH: 1.0000 | ORAL_TABLET | Freq: Every day | ORAL | Status: DC

## 2021-11-27 MED ORDER — POLYETHYLENE GLYCOL 3350 17 G PO PACK: 17.0000 g | PACK | Freq: Every day | ORAL | Status: DC | PRN

## 2021-11-27 MED ORDER — THIAMINE HCL 100 MG/ML IJ SOLN
100.0000 mg | Freq: Every day | INTRAMUSCULAR | Status: DC
  Administered 2021-11-27 – 2021-11-28 (×2): 100 mg via INTRAVENOUS
  Filled 2021-11-27: qty 2

## 2021-11-27 MED ORDER — THIAMINE HCL 100 MG/ML IJ SOLN: 100.0000 mg | Freq: Every day | INTRAMUSCULAR | Status: DC

## 2021-11-27 MED ORDER — LORAZEPAM 2 MG/ML IJ SOLN: 2.0000 mg | Freq: Once | INTRAMUSCULAR | Status: AC

## 2021-11-27 MED ORDER — LORAZEPAM 2 MG/ML IJ SOLN
INTRAMUSCULAR | Status: AC
  Administered 2021-11-27: 2 mg
  Filled 2021-11-27: qty 1

## 2021-11-27 MED ORDER — ROSUVASTATIN CALCIUM 20 MG PO TABS
20.0000 mg | ORAL_TABLET | Freq: Every day | ORAL | Status: DC
  Administered 2021-11-28 – 2021-11-30 (×3): 20 mg via ORAL
  Filled 2021-11-27 (×3): qty 1

## 2021-11-27 MED ORDER — SODIUM CHLORIDE 0.9 % IV SOLN
2500.0000 mg | Freq: Two times a day (BID) | INTRAVENOUS | Status: DC
  Administered 2021-11-28: 2500 mg via INTRAVENOUS
  Filled 2021-11-27 (×4): qty 25

## 2021-11-27 MED ORDER — FERROUS SULFATE 325 (65 FE) MG PO TABS
325.0000 mg | ORAL_TABLET | Freq: Every day | ORAL | Status: DC
  Administered 2021-11-28 – 2021-11-30 (×3): 325 mg via ORAL
  Filled 2021-11-27 (×3): qty 1

## 2021-11-27 MED ORDER — IOHEXOL 350 MG/ML SOLN
60.0000 mL | Freq: Once | INTRAVENOUS | Status: AC | PRN
  Administered 2021-11-27: 60 mL via INTRAVENOUS

## 2021-11-27 MED ORDER — LEVETIRACETAM IN NACL 500 MG/100ML IV SOLN
500.0000 mg | Freq: Two times a day (BID) | INTRAVENOUS | Status: DC
Start: 2021-11-28 — End: 2021-11-28

## 2021-11-27 MED ORDER — THIAMINE HCL 100 MG PO TABS: 100.0000 mg | ORAL_TABLET | Freq: Every day | ORAL | Status: DC

## 2021-11-27 MED ORDER — IOHEXOL 350 MG/ML SOLN
75.0000 mL | Freq: Once | INTRAVENOUS | Status: AC | PRN
  Administered 2021-11-27: 75 mL via INTRAVENOUS

## 2021-11-27 MED ORDER — ACETAMINOPHEN 650 MG RE SUPP: 650.0000 mg | Freq: Four times a day (QID) | RECTAL | Status: DC | PRN

## 2021-11-27 MED ORDER — FOLIC ACID 1 MG PO TABS
1.0000 mg | ORAL_TABLET | Freq: Every day | ORAL | Status: DC
  Administered 2021-11-28 – 2021-11-30 (×3): 1 mg via ORAL
  Filled 2021-11-27 (×3): qty 1

## 2021-11-27 MED ORDER — HEPARIN SODIUM (PORCINE) 5000 UNIT/ML IJ SOLN
5000.0000 [IU] | Freq: Three times a day (TID) | INTRAMUSCULAR | Status: DC
  Administered 2021-11-28 – 2021-11-30 (×8): 5000 [IU] via SUBCUTANEOUS
  Filled 2021-11-27 (×9): qty 1

## 2021-11-27 MED ORDER — THIAMINE HCL 100 MG PO TABS
100.0000 mg | ORAL_TABLET | Freq: Every day | ORAL | Status: DC
  Administered 2021-11-29 – 2021-11-30 (×2): 100 mg via ORAL
  Filled 2021-11-27 (×2): qty 1

## 2021-11-27 MED ORDER — QUETIAPINE FUMARATE 50 MG PO TABS
50.0000 mg | ORAL_TABLET | Freq: Every day | ORAL | Status: DC
  Administered 2021-11-28 – 2021-11-29 (×2): 50 mg via ORAL
  Filled 2021-11-27 (×2): qty 1

## 2021-11-27 MED ORDER — CARVEDILOL 6.25 MG PO TABS
6.2500 mg | ORAL_TABLET | Freq: Two times a day (BID) | ORAL | Status: DC
  Administered 2021-11-28 – 2021-11-30 (×5): 6.25 mg via ORAL
  Filled 2021-11-27 (×5): qty 1

## 2021-11-27 MED ORDER — ACETAMINOPHEN 325 MG PO TABS: 650.0000 mg | ORAL_TABLET | Freq: Four times a day (QID) | ORAL | Status: DC | PRN

## 2021-11-27 MED ORDER — ASPIRIN 81 MG PO CHEW
81.0000 mg | CHEWABLE_TABLET | Freq: Every day | ORAL | Status: DC
  Administered 2021-11-28 – 2021-11-30 (×3): 81 mg via ORAL
  Filled 2021-11-27 (×3): qty 1

## 2021-11-27 MED ORDER — ONDANSETRON HCL 4 MG PO TABS
4.0000 mg | ORAL_TABLET | Freq: Four times a day (QID) | ORAL | Status: DC | PRN
  Administered 2021-11-29: 4 mg via ORAL
  Filled 2021-11-27: qty 1

## 2021-11-27 MED ORDER — ZINC SULFATE 220 (50 ZN) MG PO CAPS
220.0000 mg | ORAL_CAPSULE | Freq: Every day | ORAL | Status: DC
  Administered 2021-11-28 – 2021-11-30 (×3): 220 mg via ORAL
  Filled 2021-11-27 (×3): qty 1

## 2021-11-27 MED ORDER — LEVETIRACETAM IN NACL 1500 MG/100ML IV SOLN
1500.0000 mg | Freq: Once | INTRAVENOUS | Status: AC
  Administered 2021-11-27: 1500 mg via INTRAVENOUS
  Filled 2021-11-27: qty 100

## 2021-11-27 MED ORDER — LACTATED RINGERS BOLUS PEDS
1000.0000 mL | Freq: Once | INTRAVENOUS | Status: AC
  Administered 2021-11-27: 1000 mL via INTRAVENOUS

## 2021-11-27 MED ORDER — ONDANSETRON HCL 4 MG/2ML IJ SOLN: 4.0000 mg | Freq: Four times a day (QID) | INTRAMUSCULAR | Status: DC | PRN

## 2021-11-27 MED ORDER — LORAZEPAM 1 MG PO TABS: 1.0000 mg | ORAL_TABLET | ORAL | Status: DC | PRN

## 2021-11-27 MED ORDER — SODIUM CHLORIDE 0.9% FLUSH: 3.0000 mL | Freq: Once | INTRAVENOUS | Status: DC

## 2021-11-27 MED ORDER — FOLIC ACID 1 MG PO TABS: 1.0000 mg | ORAL_TABLET | Freq: Every day | ORAL | Status: DC

## 2021-11-27 MED ORDER — VALPROATE SODIUM 100 MG/ML IV SOLN
1500.0000 mg | Freq: Once | INTRAVENOUS | Status: AC
  Administered 2021-11-27: 1500 mg via INTRAVENOUS
  Filled 2021-11-27: qty 15

## 2021-11-27 MED ORDER — AMLODIPINE BESYLATE 2.5 MG PO TABS
2.5000 mg | ORAL_TABLET | Freq: Every day | ORAL | Status: DC
  Administered 2021-11-28 – 2021-11-30 (×3): 2.5 mg via ORAL
  Filled 2021-11-27 (×3): qty 1

## 2021-11-27 NOTE — ED Notes (Addendum)
Witnessed seizure lasting approx in MRI; 2mg  ativan given emergently [facility] MD

## 2021-11-27 NOTE — Progress Notes (Signed)
LTM EEG hooked up and running - no initial skin breakdown - push button tested - neuro notified. No [facility] monitoring; pt in the ed

## 2021-11-27 NOTE — H&P (Signed)
History and Physical    Jesse JenkinsBradley [name] VWU:[id number] DOB: 1976/01/23 DOA: 11/27/2021  PCP: Georganna SkeansWilson, Amelia, MD  Patient coming from: home  I have personally briefly reviewed patient's old medical [facility]  Chief Complaint: global aphasia Car[place] Getting/sz   HPI: Jesse JenkinsBradley Claassen is a 46 y.o. 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 Per wife who give history patient sitting outside on porch, he called her and told her he had HA and did not feel well. She states she brought him inside. She then noted  he became unresponsive with abnormal eye movement and posturing of his left arm at which time she called EMS. In the field on arrival patient was noted to have L sided gaze and right sided weakness Lower extremity greater than upper extremity.    ED Course:  Patient admit to ED as CODE CVA CTH/MRI negative, however patient course complicated by episode of witnessed sz in MRI. Patient treated with 2 mg of ativan iv and loaded to keppra.  EEG suggestive of status and further AED was ordered by neurology  Vitals: Afeb, bp 148/92 hr 90 [id number] Labs: Wbc 10.8, hgb 16.1,  plt 164 Na 137, K 4.5, co2 18, gly 141, cr 1.45 ( 1.16) UA: large blood  wbc 21-50 CTH IMPRESSION: 1. No evidence of acute intracranial abnormality. 2. Chronic infarcts  CTA neck : no LVO  EKG Nsr no hyper acute st -twave changes Txz LR, thiamine,keppra 1500 Review of Systems: As per HPI otherwise 10 point review of systems negative.   Past Medical History:  Diagnosis Date   Anxiety    Depression    Essential hypertension    Myocardial infarction [facility](HCC)    S/P aortic dissection repair 11/05/2020    Straight graft replacement of ascending aorta and proximal transverse aortic arch with re-suspension of native aortic valve and open hemi arch distal anastomosis with aorta to right common carotid bypass and aorta to right subclavian bypass   Stroke [facility](HCC)     Past Surgical History:  Procedure Laterality Date   ASCENDING AORTIC ROOT REPLACEMENT N/A 11/04/2020   Procedure: 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 WITH REPLACEMENT OF ASCENDING AORTA AND HEMIARCH USING HEMASHIELD PLATINUM 26MM GRAFT AND HEMASHIELD GOLD 14 X 8MM GRAFT, RESUSPENSION OF NATIVE VALVE, AORTA TO RIGHT CAROTID BYPASS, AORTA TO RIGHT SUBCLAVIAN BYPASS;  Surgeon: Purcell Nailswen, Clarence H, MD;  Location: [facility] OR;  Service: Open Heart Surgery;  Laterality: N/A;   TEE WITHOUT CARDIOVERSION N/A 11/04/2020   Procedure: TRANSESOPHAGEAL ECHOCARDIOGRAM (TEE);  Surgeon: Purcell Nailswen, Clarence H, MD;  [facility];  Service: Open Heart Surgery;  Laterality: N/A;     reports that he quit smoking about 12 months ago. His smoking use included cigarettes. He has never used smokeless tobacco. He reports current alcohol use of about 2.0 standard drinks of alcohol per week. He reports that he does not currently use drugs.  No Known Allergies  No family history on file. Father -CAD Prior to Admission medications   Medication Sig Start Date End Date Taking? Authorizing Provider  acetaminophen (TYLENOL) 325 MG tablet Take 1-2 tablets (325-650 mg total) by mouth every 4 (four) hours as needed for mild  pain. 12/01/20   Love, Evlyn Kanner, PA-C  amLODipine (NORVASC) 5 MG tablet Take 0.5 tablets (2.5 mg total) by mouth in the morning and at bedtime. 04/08/21   Marcelino Duster, PA  aspirin 81 MG chewable tablet Chew 1 tablet (81 mg total) by mouth daily. 11/27/20   Kathlen Mody, MD  carvedilol (COREG) 6.25 MG tablet Take 1 tablet (6.25 mg total) by mouth 2 (two) times daily with a meal. 09/16/21   Hilty, Lisette Abu, MD  ferrous sulfate 325 (65 FE) MG tablet Take 1  tablet (325 mg total) by mouth daily. 12/10/20 06/23/21  Angiulli, Mcarthur Rossetti, PA-C  gabapentin (NEURONTIN) 300 MG capsule Take 1 capsule (300 mg total) by mouth 2 (two) times daily AND 2 capsules (600 mg total) at bedtime. 10/19/21   Ihor Austin, NP  losartan (COZAAR) 50 MG tablet Take 1 tablet (50 mg total) by mouth daily. 06/23/21 10/19/21  Chrystie Nose, MD  pantoprazole sodium (PROTONIX) 40 mg/20 mL PACK Dissolve 1 packet in 20 mLs (40 mg total) and take by mouth daily. 12/10/20   Angiulli, Mcarthur Rossetti, PA-C  polyethylene glycol (MIRALAX / GLYCOLAX) 17 g packet Take 17 g by mouth daily as needed for moderate constipation. 11/27/20   Kathlen Mody, MD  QUEtiapine (SEROQUEL) 50 MG tablet TAKE 1 TABLET BY MOUTH AT BEDTIME. AND 1/2 TABLET DURING THE DAY AS NEEDED FOR AGITATION 11/17/21   Kirsteins, Victorino Sparrow, MD  rosuvastatin (CRESTOR) 20 MG tablet Take 1 tablet (20 mg total) by mouth daily. 12/10/20 12/10/21  Angiulli, Mcarthur Rossetti, PA-C  tamsulosin (FLOMAX) 0.4 MG CAPS capsule Take 2 capsules (0.8 mg total) by mouth daily after supper. 09/23/21   Georganna Skeans, MD  Zinc Sulfate 220 (50 Zn) MG TABS Take 1 tablet (220 mg total) by mouth daily. 01/12/21   Georganna Skeans, MD    Physical Exam: Vitals:   11/27/21 2000 11/27/21 2115 11/27/21 2130 11/27/21 2145  BP:  (!) 148/92 (!) 146/95 (!) 151/97  Pulse:  90 97 98  Resp:  13 13 15   SpO2:  96% 96% 98%  Weight: 69 kg        Vitals:   11/27/21 2000 11/27/21 2115 11/27/21 2130 11/27/21 2145  BP:  (!) 148/92 (!) 146/95 (!) 151/97  Pulse:  90 97 98  Resp:  13 13 15   SpO2:  96% 96% 98%  Weight: 69 kg     Constitutional: NAD, calm, comfortable Eyes: PERRL, lids and conjunctivae normal ENMT: Mucous membranes are moist. Posterior pharynx clear of any exudate or lesions.Normal dentition.  Neck: normal, supple, no masses, no thyromegaly Respiratory: clear to auscultation bilaterally, no wheezing, no crackles. Normal respiratory effort. No accessory muscle use.   Cardiovascular: Regular rate and rhythm, no murmurs / rubs / gallops. No extremity edema. 2+ pedal pulses. No carotid bruits.  Abdomen: no tenderness, no masses palpated. No hepatosplenomegaly. Bowel sounds positive.  Musculoskeletal: no clubbing / cyanosis. No joint deformity upper and lower extremities. Good ROM, no contractures. Normal muscle tone.  Skin: no rashes, lesions, ulcers. No induration Neurologic: CN 2-12 grossly intact. Sensation intact, Strength 5/5 in all 4.  All except  right 4/5 Psychiatric: alert to self and time, not place, pleasant note mob confusion Normal mood.    Labs on Admission: I have personally reviewed following labs and imaging studies  CBC: Recent Labs  Lab 11/27/21 2021 11/27/21 2022  WBC 10.8*  --   NEUTROABS 7.8*  --   HGB 16.1 17.0  HCT 49.0 50.0  MCV 98.2  --   PLT 164  --    Basic Metabolic Panel: Recent Labs  Lab 11/27/21 2021 11/27/21 2022  NA 137 139  K 4.5 4.5  CL 104 104  CO2 18*  --   GLUCOSE 141* 135*  BUN 13 13  CREATININE 1.45* 1.10  CALCIUM 9.1  --    GFR: Estimated Creatinine Clearance: 81 mL/min (by C-G formula based on SCr of 1.1 mg/dL). Liver Function Tests: Recent Labs  Lab 11/27/21 2021  AST 30  ALT 16  ALKPHOS 62  BILITOT 0.8  PROT 6.9  ALBUMIN 4.0   No results for input(s): "LIPASE", "AMYLASE" in the last 168 hours. No results for input(s): "AMMONIA" in the last 168 hours. Coagulation Profile: Recent Labs  Lab 11/27/21 2021  INR 1.1   Cardiac Enzymes: No results for input(s): "CKTOTAL", "CKMB", "CKMBINDEX", "TROPONINI" in the last 168 hours. BNP (last 3 results) No results for input(s): "PROBNP" in the last 8760 hours. HbA1C: No results for input(s): "HGBA1C" in the last 72 hours. CBG: No results for input(s): "GLUCAP" in the last 168 hours. Lipid Profile: No results for input(s): "CHOL", "HDL", "LDLCALC", "TRIG", "CHOLHDL", "LDLDIRECT" in the last 72 hours. Thyroid Function Tests: No  results for input(s): "TSH", "T4TOTAL", "FREET4", "T3FREE", "THYROIDAB" in the last 72 hours. Anemia Panel: No results for input(s): "VITAMINB12", "FOLATE", "FERRITIN", "TIBC", "IRON", "RETICCTPCT" in the last 72 hours. Urine analysis:    Component Value Date/Time   LABSPEC 1.015 12/23/2020 1501   PHURINE 6.0 12/23/2020 1501   GLUCOSEU NEGATIVE 12/23/2020 1501   HGBUR LARGE (A) 12/23/2020 1501   BILIRUBINUR NEGATIVE 12/23/2020 1501   KETONESUR NEGATIVE 12/23/2020 1501   PROTEINUR >=300 (A) 12/23/2020 1501   UROBILINOGEN 1.0 12/23/2020 1501   NITRITE POSITIVE (A) 12/23/2020 1501   LEUKOCYTESUR LARGE (A) 12/23/2020 1501    Radiological Exams on Admission: MR BRAIN WO CONTRAST  Result Date: 11/27/2021 CLINICAL DATA:  Neuro deficit, acute, stroke suspected. Mental status change, unknown cause. Aphasia and right-sided weakness. History of aortic dissection repair. EXAM: MRI HEAD WITHOUT CONTRAST TECHNIQUE: Multiplanar, multiecho pulse sequences of the brain and surrounding structures were obtained without intravenous contrast. COMPARISON:  Head CT 11/27/2021 and MRI 11/07/2020 FINDINGS: The examination had to be discontinued prior to completion due to the patient actively seizing. Axial and coronal diffusion, sagittal T1, axial T2 FLAIR, and axial susceptibility weighted sequences were obtained. There is no evidence of an acute infarct, mass, midline shift, hydrocephalus, or extra-axial fluid collection. A moderately large chronic posterior right MCA infarct is again noted with associated hemosiderin deposition. There are also small chronic infarcts in the centrum semiovale/corona radiata bilaterally, left basal ganglia, and both cerebellar hemispheres. IMPRESSION: 1. Incomplete examination. No acute infarct. 2. Chronic infarcts as above. Electronically Signed   By: Sebastian Ache M.D.   On: 11/27/2021 21:11   CT ANGIO HEAD NECK W WO CM (CODE STROKE)  Result Date: 11/27/2021 CLINICAL DATA:  Neuro  deficit, acute, stroke suspected. Aphasia and right-sided weakness. History of aortic dissection. EXAM: CT ANGIOGRAPHY HEAD AND NECK TECHNIQUE: Multidetector CT imaging of the head and neck was performed using the standard protocol during bolus administration of intravenous contrast. Multiplanar CT image reconstructions and MIPs were obtained to evaluate the vascular anatomy. Carotid stenosis measurements (when applicable) are obtained utilizing NASCET criteria, using the distal internal carotid diameter as the denominator. RADIATION DOSE REDUCTION: This exam was performed according to the departmental dose-optimization program which includes automated  exposure control, adjustment of the mA and/or kV according to patient size and/or use of iterative reconstruction technique. CONTRAST:  40mL OMNIPAQUE IOHEXOL 350 MG/ML SOLN COMPARISON:  Head and neck MRA 11/07/2020. Head and neck CTA 11/04/2020. FINDINGS: CTA NECK FINDINGS Aortic arch: More fully evaluated on the separate CTA of the chest, abdomen, and pelvis. The included portion of the aorta to right subclavian and right common carotid bypass graft is widely patent. Right carotid system: Patent without evidence of stenosis or dissection. Left carotid system: Patent with minimal calcified plaque in the proximal ICA. No evidence of a significant stenosis or dissection. Vertebral arteries: Patent without evidence of stenosis or dissection. Strongly dominant right vertebral artery. Skeleton: Mild disc degeneration in the cervical spine. Focally advanced left facet arthrosis at C3-4. Other neck: No evidence of cervical lymphadenopathy or mass. Upper chest: Reported separately. Review of the MIP images confirms the above findings CTA HEAD FINDINGS Anterior circulation: The internal carotid arteries are widely patent from skull base to carotid termini. ACAs and MCAs are patent without evidence of a proximal branch occlusion or significant proximal stenosis. There is a  decreased number of distal right MCA branch vessels in the region of the chronic infarct. No aneurysm is identified. Posterior circulation: The intracranial vertebral arteries are widely patent to the basilar. Patent PICA and SCA origins are seen bilaterally. The basilar artery is widely patent. There is a large left posterior communicating artery with hypoplastic left P1 segment. Both PCAs are patent without evidence of a significant proximal stenosis. No aneurysm is identified. Venous sinuses: Patent. Anatomic variants: Predominantly fetal origin of the left PCA. Review of the MIP images confirms the above findings IMPRESSION: No large vessel occlusion or significant proximal stenosis in the head or neck. These results were called by telephone at the time of interpretation on 11/27/2021 at 8:50 pm to Dr. Erick Blinks, who verbally acknowledged these results. Electronically Signed   By: Sebastian Ache M.D.   On: 11/27/2021 20:59   CT Angio Chest/Abd/Pel for Dissection W and/or Wo Contrast  Result Date: 11/27/2021 CLINICAL DATA:  Neurologic deficit, history of aortic dissection status post repair EXAM: CT ANGIOGRAPHY CHEST, ABDOMEN AND PELVIS TECHNIQUE: Non-contrast CT of the chest was initially obtained. Multidetector CT imaging through the chest, abdomen and pelvis was performed using the standard protocol during bolus administration of intravenous contrast. Multiplanar reconstructed images and MIPs were obtained and reviewed to evaluate the vascular anatomy. RADIATION DOSE REDUCTION: This exam was performed according to the departmental dose-optimization program which includes automated exposure control, adjustment of the mA and/or kV according to patient size and/or use of iterative reconstruction technique. CONTRAST:  53mL OMNIPAQUE IOHEXOL 350 MG/ML SOLN COMPARISON:  08/12/2021 FINDINGS: CTA CHEST FINDINGS Cardiovascular: Stable postsurgical changes from prior type A aortic dissection repair, with  interposition graft involving the ascending thoracic aorta, innominate artery, and origin of the right subclavian and right common carotid arteries as before. No evidence of recurrent dissection or aneurysm. The heart is unremarkable without pericardial effusion. Lung not optimized for opacification of the pulmonary vasculature, there is sufficient contrast enhancement to exclude central or segmental pulmonary emboli. No filling defects. Mediastinum/Nodes: No enlarged mediastinal, hilar, or axillary lymph nodes. Thyroid gland, trachea, and esophagus demonstrate no significant findings. Lungs/Pleura: No acute airspace disease, effusion, or pneumothorax. Central airways are patent. Musculoskeletal: No acute or destructive bony lesions. Reconstructed images demonstrate no additional findings. Review of the MIP images confirms the above findings. CTA ABDOMEN AND PELVIS FINDINGS VASCULAR Aorta:  Normal caliber aorta without aneurysm, dissection, vasculitis or significant stenosis. Mild diffuse atherosclerosis. Celiac: Patent without evidence of aneurysm, dissection, vasculitis or significant stenosis. SMA: Patent without evidence of aneurysm, dissection, vasculitis or significant stenosis. Renals: There are bilateral accessory renal arteries in addition to the bilateral main renal arteries. Arteries are patent without evidence of aneurysm, dissection, vasculitis, fibromuscular dysplasia or significant stenosis. IMA: Patent without evidence of aneurysm, dissection, vasculitis or significant stenosis. Inflow: Patent without evidence of aneurysm, dissection, vasculitis or significant stenosis. Mild atherosclerosis. Veins: No obvious venous abnormality within the limitations of this arterial phase study. Review of the MIP images confirms the above findings. NON-VASCULAR Hepatobiliary: No focal liver abnormality is seen. No gallstones, gallbladder wall thickening, or biliary dilatation. Pancreas: Unremarkable. No pancreatic  ductal dilatation or surrounding inflammatory changes. Spleen: Normal in size without focal abnormality. Adrenals/Urinary Tract: Adrenal glands are unremarkable. Kidneys are normal, without renal calculi, focal lesion, or hydronephrosis. Bladder is unremarkable. Stomach/Bowel: No bowel obstruction or ileus. Normal appendix right lower quadrant. No bowel wall thickening or inflammatory change. Lymphatic: No pathologic adenopathy. Reproductive: Prostate is unremarkable. Other: No free fluid or free intraperitoneal gas. No abdominal wall hernia. Musculoskeletal: No acute or destructive bony lesions. Reconstructed images demonstrate no additional findings. Review of the MIP images confirms the above findings. IMPRESSION: 1. Stable postoperative changes from type A thoracic aortic dissection repair. No evidence of recurrent dissection or aortic aneurysm. 2. No evidence of pulmonary embolus. 3. No acute intrathoracic, intra-abdominal, or intrapelvic process. 4.  Aortic Atherosclerosis (ICD10-I70.0). Electronically Signed   By: Sharlet Salina M.D.   On: 11/27/2021 20:54   CT HEAD CODE STROKE WO CONTRAST  Result Date: 11/27/2021 CLINICAL DATA:  Code stroke. Neuro deficit, acute, stroke suspected. EXAM: CT HEAD WITHOUT CONTRAST TECHNIQUE: Contiguous axial images were obtained from the base of the skull through the vertex without intravenous contrast. RADIATION DOSE REDUCTION: This exam was performed according to the departmental dose-optimization program which includes automated exposure control, adjustment of the mA and/or kV according to patient size and/or use of iterative reconstruction technique. COMPARISON:  Head CT 11/18/2020 and MRI 11/07/2020 FINDINGS: Brain: A moderately large region of encephalomalacia is now present in the posterior right MCA territory reflecting evolution of the infarct which was acute last year. Small chronic cerebellar infarcts also correspond to acute infarcts last year. There is an  unchanged chronic lacunar infarct at the posterior aspect of the left lentiform nucleus. No definite acute infarct, intracranial hemorrhage, mass, midline shift, hydrocephalus, or extra-axial fluid collection is identified. Vascular: No hyperdense vessel. Skull: No fracture or suspicious osseous lesion. Sinuses/Orbits: Visualized paranasal sinuses and mastoid air cells are clear. Unremarkable orbits. Other: None. IMPRESSION: 1. No evidence of acute intracranial abnormality. 2. Chronic infarcts as above. These results were communicated to Dr. Derry Lory at 8:29 pm on 11/27/2021 by text page via the [facility] messaging system. Electronically Signed   By: Sebastian Ache M.D.   On: 11/27/2021 20:30    EKG: Independently reviewed. See above  Assessment/Plan New Onset SZ  -pt initial presented to global aphasia cva like sxs  -CTH/MRI negative for CVA -ED course complicated by new on set SZ Admit to SDU -s/p ativan 4m iv / keppra 1500 mg load  -continue with keppra  bid  -continuous EEG monitoring per neurology  -neuro checks  -check mag,  -await final neurology recs   Hx of ETOH use -? W/d sz  -wife states patient drink 3-4 beers nightly but never has difficulty when he  stops drinking. -place on ciwa  - mvi,thiamine,folate   Mild Aki -hold nephrotoxic medications -gentle ivfs   CVA 2022 -residual aphasia -continue secondary ppx with asa  Hx of AAA s/p repair    HTN  -stable resume home regimen  -carvedilol, amlodipine, resume arb once  cr trend improved  CAD s/p MI  -no active issues  -resume home cardiac medications   GERD -ppi   Depression Anxiety  -resume home regimen    DVT prophylaxis: heparin Code Status: full Family Communication:  Disposition Plan:patient  expected to be admitted greater than 2 midnights Consults called: neuro Khalqudina Admission status: SDU   Lurline Del MD [facility]   If 7PM-7AM, please contact  night-coverage [email or web address] Password [email or web address]  11/27/2021, 10:03 PM

## 2021-11-27 NOTE — Progress Notes (Signed)
   11/27/21 2000  Clinical Encounter Type  Visited With Family;Patient not available  Visit Type Social support  Referral From Family  Consult/Referral To Chaplain   Chaplain noticed wife of pt in the consultation room in ED and inquired if she needed any assistance.  At her request, Chaplain provided support and learned from nursing that pt had gone for a medical test.  When the Chaplain returned, the doctor was speaking with wife.  Chaplain remains available to support family and pt.  Andi Hence, Chaplain  Pager [phone]

## 2021-11-27 NOTE — ED Provider Notes (Signed)
[facility]   CSN: [id number] Arrival date & time: 11/27/21  2012  An emergency department physician performed an initial assessment on this suspected stroke patient at 2014.  History  Chief Complaint  Patient presents with   Code Stroke    Jesse [name] is a 46 y.o. male.  With PMH of aortic dissection status postrepair, HTN, CVA, polysubstance abuse brought in by EMS from home for concern for stroke with aphasia and right sided weakness and left gaze preference. BG 100 with EMS. SBP 181.   Patient arrived tachycardic to the 110s.  Placed on nonrebreather by EMS.  No hypoxia per EMS.  He was with his wife around [id number]. Reported not feeling well. Wife sat him at the edge of the bed and handed him some water. Noted that his RUE was flopping around and then shortly afterwards, his L hand contracted and LUE was drawn in and then it started jerking. He kept of reporting that he was not feeling good. She called EMS and he was noted to be aphasia, had a left gaze and RLE weakness and he was brought in as a code stroke.    HPI     Home Medications Prior to Admission medications   Medication Sig Start Date End Date Taking? Authorizing Provider  acetaminophen (TYLENOL) 325 MG tablet Take 1-2 tablets (325-650 mg total) by mouth every 4 (four) hours as needed for mild pain. 12/01/20   Love, Evlyn KannerPamela S, PA-C  amLODipine (NORVASC) 5 MG tablet Take 0.5 tablets (2.5 mg total) by mouth in the morning and at bedtime. 04/08/21   Marcelino Dusteruke, Angela Nicole, PA  aspirin 81 MG chewable tablet Chew 1 tablet (81 mg total) by mouth daily. 11/27/20   Kathlen ModyAkula, Vijaya, MD  carvedilol (COREG) 6.25 MG tablet Take 1 tablet (6.25 mg total) by mouth 2 (two) times daily with a meal. 09/16/21   Hilty, Lisette AbuKenneth C, MD  ferrous sulfate 325 (65 FE) MG tablet Take 1 tablet (325 mg total) by mouth daily. 12/10/20 06/23/21  Angiulli, Mcarthur Rossettianiel J, PA-C  gabapentin (NEURONTIN) 300 MG capsule  Take 1 capsule (300 mg total) by mouth 2 (two) times daily AND 2 capsules (600 mg total) at bedtime. 10/19/21   Ihor AustinMcCue, Jessica, NP  losartan (COZAAR) 50 MG tablet Take 1 tablet (50 mg total) by mouth daily. 06/23/21 10/19/21  Chrystie NoseHilty, Kenneth C, MD  pantoprazole sodium (PROTONIX) 40 mg/20 mL PACK Dissolve 1 packet in 20 mLs (40 mg total) and take by mouth daily. 12/10/20   Angiulli, Mcarthur Rossettianiel J, PA-C  polyethylene glycol (MIRALAX / GLYCOLAX) 17 g packet Take 17 g by mouth daily as needed for moderate constipation. 11/27/20   Kathlen ModyAkula, Vijaya, MD  QUEtiapine (SEROQUEL) 50 MG tablet TAKE 1 TABLET BY MOUTH AT BEDTIME. AND 1/2 TABLET DURING THE DAY AS NEEDED FOR AGITATION 11/17/21   Kirsteins, Victorino SparrowAndrew E, MD  rosuvastatin (CRESTOR) 20 MG tablet Take 1 tablet (20 mg total) by mouth daily. 12/10/20 12/10/21  Angiulli, Mcarthur Rossettianiel J, PA-C  tamsulosin (FLOMAX) 0.4 MG CAPS capsule Take 2 capsules (0.8 mg total) by mouth daily after supper. 09/23/21   Georganna SkeansWilson, Amelia, MD  Zinc Sulfate 220 (50 Zn) MG TABS Take 1 tablet (220 mg total) by mouth daily. 01/12/21   Georganna SkeansWilson, Amelia, MD      Allergies    Patient has no known allergies.    Review of Systems   Review of Systems  Physical Exam Updated Vital Signs BP (!) 142/88 (  BP Location: Right Arm)   Pulse 69   Temp 98.5 F (36.9 C) (Oral)   Resp 20   Ht 6\' 4"  (1.93 m)   Wt 71.1 kg   SpO2 100%   BMI 19.08 kg/m  Physical Exam Constitutional: Chronically ill-appearing, not answering questions, awake and alert Eyes: Conjunctivae are normal. ENT      Head: Normocephalic and atraumatic.      Nose: No congestion.      Mouth/Throat: Mucous membranes are moist.      Neck: No stridor. Cardiovascular: S1, S2,  Normal and symmetric distal pulses are present in all extremities.Warm and well perfused. Respiratory: On nonrebreather, equal breath sounds, O2 sat 100% Gastrointestinal: Soft and nontender.  Musculoskeletal: Thin extremities Neurologic: Aphasia, dysarthria, right lower  extremity weakness greater than right upper extremity, left upper extremity weakness Skin: Skin is warm Psychiatric: Not answering questions  ED Results / Procedures / Treatments   Labs (all labs ordered are listed, but only abnormal results are displayed) Labs Reviewed  CBC - Abnormal; Notable for the following components:      Result Value   WBC 10.8 (*)    All other components within normal limits  DIFFERENTIAL - Abnormal; Notable for the following components:   Neutro Abs 7.8 (*)    Abs Immature Granulocytes 0.08 (*)    All other components within normal limits  COMPREHENSIVE METABOLIC PANEL - Abnormal; Notable for the following components:   CO2 18 (*)    Glucose, Bld 141 (*)    Creatinine, Ser 1.45 (*)    GFR, Estimated 60 (*)    All other components within normal limits  URINALYSIS, ROUTINE W REFLEX MICROSCOPIC - Abnormal; Notable for the following components:   Hgb urine dipstick LARGE (*)    Protein, ur 30 (*)    All other components within normal limits  RAPID URINE DRUG SCREEN, HOSP PERFORMED - Abnormal; Notable for the following components:   Opiates POSITIVE (*)    Benzodiazepines POSITIVE (*)    All other components within normal limits  I-STAT CHEM 8, ED - Abnormal; Notable for the following components:   Glucose, Bld 135 (*)    TCO2 21 (*)    All other components within normal limits  URINE CULTURE  PROTIME-INR  APTT  ETHANOL  VITAMIN B12  FOLATE  AMMONIA  TSH  HIV ANTIBODY (ROUTINE TESTING W REFLEX)  CBC  CREATININE, SERUM  COMPREHENSIVE METABOLIC PANEL  CBC  CBG MONITORING, ED        EKG Interpretation  Date/Time:  Friday November 27 2021 21:24:51 EDT Ventricular Rate:  87 PR Interval:  184 QRS Duration: 79 QT Interval:  383 QTC Calculation: 461 R Axis:   74 Text Interpretation: Sinus rhythm Biatrial enlargement Anteroseptal infarct, age indeterminate Confirmed by [id number] ([id number]) on 11/27/2021 9:35:43 PM  Radiology MR BRAIN WO  CONTRAST  Result Date: 11/27/2021 CLINICAL DATA:  Neuro deficit, acute, stroke suspected. Mental status change, unknown cause. Aphasia and right-sided weakness. History of aortic dissection repair. EXAM: MRI HEAD WITHOUT CONTRAST TECHNIQUE: Multiplanar, multiecho pulse sequences of the brain and surrounding structures were obtained without intravenous contrast. COMPARISON:  Head CT 11/27/2021 and MRI 11/07/2020 FINDINGS: The examination had to be discontinued prior to completion due to the patient actively seizing. Axial and coronal diffusion, sagittal T1, axial T2 FLAIR, and axial susceptibility weighted sequences were obtained. There is no evidence of an acute infarct, mass, midline shift, hydrocephalus, or extra-axial fluid collection. A moderately large chronic  posterior right MCA infarct is again noted with associated hemosiderin deposition. There are also small chronic infarcts in the centrum semiovale/corona radiata bilaterally, left basal ganglia, and both cerebellar hemispheres. IMPRESSION: 1. Incomplete examination. No acute infarct. 2. Chronic infarcts as above. Electronically Signed   By: Sebastian Ache M.D.   On: 11/27/2021 21:11   CT ANGIO HEAD NECK W WO CM (CODE STROKE)  Result Date: 11/27/2021 CLINICAL DATA:  Neuro deficit, acute, stroke suspected. Aphasia and right-sided weakness. History of aortic dissection. EXAM: CT ANGIOGRAPHY HEAD AND NECK TECHNIQUE: Multidetector CT imaging of the head and neck was performed using the standard protocol during bolus administration of intravenous contrast. Multiplanar CT image reconstructions and MIPs were obtained to evaluate the vascular anatomy. Carotid stenosis measurements (when applicable) are obtained utilizing NASCET criteria, using the distal internal carotid diameter as the denominator. RADIATION DOSE REDUCTION: This exam was performed according to the departmental dose-optimization program which includes automated exposure control, adjustment of the  mA and/or kV according to patient size and/or use of iterative reconstruction technique. CONTRAST:  60mL OMNIPAQUE IOHEXOL 350 MG/ML SOLN COMPARISON:  Head and neck MRA 11/07/2020. Head and neck CTA 11/04/2020. FINDINGS: CTA NECK FINDINGS Aortic arch: More fully evaluated on the separate CTA of the chest, abdomen, and pelvis. The included portion of the aorta to right subclavian and right common carotid bypass graft is widely patent. Right carotid system: Patent without evidence of stenosis or dissection. Left carotid system: Patent with minimal calcified plaque in the proximal ICA. No evidence of a significant stenosis or dissection. Vertebral arteries: Patent without evidence of stenosis or dissection. Strongly dominant right vertebral artery. Skeleton: Mild disc degeneration in the cervical spine. Focally advanced left facet arthrosis at C3-4. Other neck: No evidence of cervical lymphadenopathy or mass. Upper chest: Reported separately. Review of the MIP images confirms the above findings CTA HEAD FINDINGS Anterior circulation: The internal carotid arteries are widely patent from skull base to carotid termini. ACAs and MCAs are patent without evidence of a proximal branch occlusion or significant proximal stenosis. There is a decreased number of distal right MCA branch vessels in the region of the chronic infarct. No aneurysm is identified. Posterior circulation: The intracranial vertebral arteries are widely patent to the basilar. Patent PICA and SCA origins are seen bilaterally. The basilar artery is widely patent. There is a large left posterior communicating artery with hypoplastic left P1 segment. Both PCAs are patent without evidence of a significant proximal stenosis. No aneurysm is identified. Venous sinuses: Patent. Anatomic variants: Predominantly fetal origin of the left PCA. Review of the MIP images confirms the above findings IMPRESSION: No large vessel occlusion or significant proximal stenosis in  the head or neck. These results were called by telephone at the time of interpretation on 11/27/2021 at 8:50 pm to Dr. Erick Blinks, who verbally acknowledged these results. Electronically Signed   By: Sebastian Ache M.D.   On: 11/27/2021 20:59   CT Angio Chest/Abd/Pel for Dissection W and/or Wo Contrast  Result Date: 11/27/2021 CLINICAL DATA:  Neurologic deficit, history of aortic dissection status post repair EXAM: CT ANGIOGRAPHY CHEST, ABDOMEN AND PELVIS TECHNIQUE: Non-contrast CT of the chest was initially obtained. Multidetector CT imaging through the chest, abdomen and pelvis was performed using the standard protocol during bolus administration of intravenous contrast. Multiplanar reconstructed images and MIPs were obtained and reviewed to evaluate the vascular anatomy. RADIATION DOSE REDUCTION: This exam was performed according to the departmental dose-optimization program which includes automated exposure control,  adjustment of the mA and/or kV according to patient size and/or use of iterative reconstruction technique. CONTRAST:  75mL OMNIPAQUE IOHEXOL 350 MG/ML SOLN COMPARISON:  08/12/2021 FINDINGS: CTA CHEST FINDINGS Cardiovascular: Stable postsurgical changes from prior type A aortic dissection repair, with interposition graft involving the ascending thoracic aorta, innominate artery, and origin of the right subclavian and right common carotid arteries as before. No evidence of recurrent dissection or aneurysm. The heart is unremarkable without pericardial effusion. Lung not optimized for opacification of the pulmonary vasculature, there is sufficient contrast enhancement to exclude central or segmental pulmonary emboli. No filling defects. Mediastinum/Nodes: No enlarged mediastinal, hilar, or axillary lymph nodes. Thyroid gland, trachea, and esophagus demonstrate no significant findings. Lungs/Pleura: No acute airspace disease, effusion, or pneumothorax. Central airways are patent. Musculoskeletal:  No acute or destructive bony lesions. Reconstructed images demonstrate no additional findings. Review of the MIP images confirms the above findings. CTA ABDOMEN AND PELVIS FINDINGS VASCULAR Aorta: Normal caliber aorta without aneurysm, dissection, vasculitis or significant stenosis. Mild diffuse atherosclerosis. Celiac: Patent without evidence of aneurysm, dissection, vasculitis or significant stenosis. SMA: Patent without evidence of aneurysm, dissection, vasculitis or significant stenosis. Renals: There are bilateral accessory renal arteries in addition to the bilateral main renal arteries. Arteries are patent without evidence of aneurysm, dissection, vasculitis, fibromuscular dysplasia or significant stenosis. IMA: Patent without evidence of aneurysm, dissection, vasculitis or significant stenosis. Inflow: Patent without evidence of aneurysm, dissection, vasculitis or significant stenosis. Mild atherosclerosis. Veins: No obvious venous abnormality within the limitations of this arterial phase study. Review of the MIP images confirms the above findings. NON-VASCULAR Hepatobiliary: No focal liver abnormality is seen. No gallstones, gallbladder wall thickening, or biliary dilatation. Pancreas: Unremarkable. No pancreatic ductal dilatation or surrounding inflammatory changes. Spleen: Normal in size without focal abnormality. Adrenals/Urinary Tract: Adrenal glands are unremarkable. Kidneys are normal, without renal calculi, focal lesion, or hydronephrosis. Bladder is unremarkable. Stomach/Bowel: No bowel obstruction or ileus. Normal appendix right lower quadrant. No bowel wall thickening or inflammatory change. Lymphatic: No pathologic adenopathy. Reproductive: Prostate is unremarkable. Other: No free fluid or free intraperitoneal gas. No abdominal wall hernia. Musculoskeletal: No acute or destructive bony lesions. Reconstructed images demonstrate no additional findings. Review of the MIP images confirms the above  findings. IMPRESSION: 1. Stable postoperative changes from type A thoracic aortic dissection repair. No evidence of recurrent dissection or aortic aneurysm. 2. No evidence of pulmonary embolus. 3. No acute intrathoracic, intra-abdominal, or intrapelvic process. 4.  Aortic Atherosclerosis (ICD10-I70.0). Electronically Signed   By: Sharlet Salina M.D.   On: 11/27/2021 20:54   CT HEAD CODE STROKE WO CONTRAST  Result Date: 11/27/2021 CLINICAL DATA:  Code stroke. Neuro deficit, acute, stroke suspected. EXAM: CT HEAD WITHOUT CONTRAST TECHNIQUE: Contiguous axial images were obtained from the base of the skull through the vertex without intravenous contrast. RADIATION DOSE REDUCTION: This exam was performed according to the departmental dose-optimization program which includes automated exposure control, adjustment of the mA and/or kV according to patient size and/or use of iterative reconstruction technique. COMPARISON:  Head CT 11/18/2020 and MRI 11/07/2020 FINDINGS: Brain: A moderately large region of encephalomalacia is now present in the posterior right MCA territory reflecting evolution of the infarct which was acute last year. Small chronic cerebellar infarcts also correspond to acute infarcts last year. There is an unchanged chronic lacunar infarct at the posterior aspect of the left lentiform nucleus. No definite acute infarct, intracranial hemorrhage, mass, midline shift, hydrocephalus, or extra-axial fluid collection is identified. Vascular: No hyperdense vessel.  Skull: No fracture or suspicious osseous lesion. Sinuses/Orbits: Visualized paranasal sinuses and mastoid air cells are clear. Unremarkable orbits. Other: None. IMPRESSION: 1. No evidence of acute intracranial abnormality. 2. Chronic infarcts as above. These results were communicated to Dr. Derry Lory at 8:29 pm on 11/27/2021 by text page via the [facility] messaging system. Electronically Signed   By: Sebastian Ache M.D.   On: 11/27/2021 20:30     Procedures .Critical Care  Performed by: Mardene Sayer, MD Authorized by: Mardene Sayer, MD   Critical care provider statement:    Critical care time (minutes):  30   Critical care was necessary to treat or prevent imminent or life-threatening deterioration of the following conditions:  CNS failure or compromise   Critical care was time spent personally by me on the following activities:  Development of treatment plan with patient or surrogate, discussions with consultants, evaluation of patient's response to treatment, examination of patient, ordering and review of laboratory studies, ordering and review of radiographic studies, ordering and performing treatments and interventions, pulse oximetry, re-evaluation of patient's condition, review of old charts and obtaining history from patient or surrogate   Care discussed with: admitting provider     Remained on constant cardiac monitoring, sinus rhythm  Medications Ordered in ED Medications  sodium chloride flush (NS) 0.9 % injection 3 mL (3 mLs Intravenous Not Given 11/27/21 2138)  thiamine (VITAMIN B1) injection 100 mg (100 mg Intravenous Given 11/27/21 2138)    Or  thiamine (VITAMIN B1) tablet 100 mg ( Oral See Alternative 11/27/21 2138)  aspirin chewable tablet 81 mg (has no administration in time range)  carvedilol (COREG) tablet 6.25 mg (has no administration in time range)  amLODipine (NORVASC) tablet 2.5 mg (has no administration in time range)  rosuvastatin (CRESTOR) tablet 20 mg (has no administration in time range)  QUEtiapine (SEROQUEL) tablet 50 mg (0 mg Oral Hold 11/28/21 0040)  pantoprazole (PROTONIX) EC tablet 40 mg (has no administration in time range)  polyethylene glycol (MIRALAX / GLYCOLAX) packet 17 g (has no administration in time range)  ferrous sulfate tablet 325 mg (has no administration in time range)  zinc sulfate capsule 220 mg (has no administration in time range)  LORazepam (ATIVAN) tablet 1-4 mg (has  no administration in time range)    Or  LORazepam (ATIVAN) injection 1-4 mg (has no administration in time range)  folic acid (FOLVITE) tablet 1 mg (has no administration in time range)  multivitamin with minerals tablet 1 tablet (has no administration in time range)  heparin injection 5,000 Units (5,000 Units Subcutaneous Given 11/28/21 0149)  sodium chloride flush (NS) 0.9 % injection 3 mL (3 mLs Intravenous Not Given 11/27/21 2326)  acetaminophen (TYLENOL) tablet 650 mg (has no administration in time range)    Or  acetaminophen (TYLENOL) suppository 650 mg (has no administration in time range)  ondansetron (ZOFRAN) tablet 4 mg (has no administration in time range)    Or  ondansetron (ZOFRAN) injection 4 mg (has no administration in time range)  levETIRAcetam (KEPPRA) 2,500 mg in sodium chloride 0.9 % 250 mL IVPB (0 mg Intravenous Stopped 11/28/21 0119)  levETIRAcetam (KEPPRA) IVPB 500 mg/100 mL premix (has no administration in time range)    Or  levETIRAcetam (KEPPRA) tablet 500 mg (has no administration in time range)  feeding supplement (ENSURE ENLIVE / ENSURE PLUS) liquid 237 mL (has no administration in time range)  iohexol (OMNIPAQUE) 350 MG/ML injection 60 mL (60 mLs Intravenous Contrast Given 11/27/21 2037)  iohexol (OMNIPAQUE) 350 MG/ML injection 75 mL (75 mLs Intravenous Contrast Given 11/27/21 2039)  levETIRAcetam (KEPPRA) IVPB 1500 mg/ 100 mL premix (0 mg Intravenous Stopped 11/27/21 2153)  LORazepam (ATIVAN) injection 2 mg (2 mg Intravenous Given 11/27/21 2112)  lactated ringers bolus PEDS (0 mLs Intravenous Stopped 11/27/21 2312)  valproate (DEPACON) 1,500 mg in dextrose 5 % 50 mL IVPB (0 mg Intravenous Stopped 11/28/21 0037)    ED Course/ Medical Decision Making/ A&P Clinical Course as of 11/28/21 0231  Fri Nov 27, 2021  2023 Spoke with neurology, patient has history of aortic dissection and stroke, ordered for CTA dissection scan with CTA head and neck. [VB]  2040 No ICH on CT head on  personal interpretation [VB]  2126 Patient's MRI brain with no evidence of CVA.  CTA dissection negative for acute dissection.  Spoke with Dr. Derry Lory of neurology who believes patient's episode today was more likely secondary to seizure as he had another focal seizure while in CT scanner.  He has received IV 2 mg Ativan and is going to be receiving Keppra 500 mg twice daily and remain on continuous EEG monitoring.  Consulting hospitalist for admission. [VB]  2200 Spoke with Dr Maisie Fus of hospitalist team who will put in orders for patient's admission. [VB]    Clinical Course User Index [VB] Mardene Sayer, MD                           Medical Decision Making Jesse [name] is a 46 y.o. male.  With PMH of aortic dissection status postrepair, HTN, CVA, polysubstance abuse brought in by EMS from home for concern for stroke with aphasia and right sided weakness and left gaze preference. BG 100 with EMS. SBP 181.   Patient presenting to the ED with symptoms concerning for CVA. On arrival, the patient's airway is intact. Per EMS, glucose 100, BP 180. Reported symptoms include right-sided weakness, aphasia, dysarthria, left gaze preference. Last known normal was 1730. Patient denies recent CVA or trauma, is not anticoagulated, no sudden onset headache or other symptoms concerning for [facility], no recent surgery. Based on my exam, patient is VAN positive/ (for weakness + 1 of vision, aphasia and  neglect) and has an NIHSS of 19. Patient taken for emergent CT upon arrival (CTA if VAN positive).  CT negative for hemorrhage. MI/dissection/seizure not suspected based on history. Will consider stroke mimics (Bell's palsy, Todd's paralysis, complex migraine, neuropathy) . Discussed case with neurology who was present at bedside for patient's arrival, initially, clot buster was considered but, during MRI, patient had seizure.    MRI was ordered which was negative for acute stroke and based on story, more concern  for seizure which patient had focal seizure while in MRI scanner. He was given Ativan and loaded with Keppra per neurology.  Additionally, with history of aortic dissection, CTA dissection scan was obtained with no evidence of dissection.  Patient's blood glucose was 141, no other acute electrolyte abnormalities on blood work.  He was admitted to hospitalist for continuous EEG monitoring and seizure treatment per neurology.  Patient's airway was intact and did not require intubation.  Amount and/or Complexity of Data Reviewed Labs: ordered. Decision-making details documented in ED Course. Radiology: ordered and independent interpretation performed. Decision-making details documented in ED Course.    Details: No ICH on CT head ECG/medicine tests: independent interpretation performed. Decision-making details documented in ED Course.  Risk Prescription drug management. Decision regarding hospitalization.  Final Clinical Impression(s) / ED Diagnoses Final diagnoses:  Seizure [facility])  Aphasia    Rx / DC Orders ED Discharge Orders     None         Mardene Sayer, MD 11/28/21 [phone]

## 2021-11-27 NOTE — Consult Note (Addendum)
NEUROLOGY CONSULTATION NOTE   Date of service: November 27, 2021 Patient Name: Jesse [name] MRN:  [id number] DOB:  [birth year] Reason for consult: "Stroke code for aphasia, weakness" Requesting Provider: Mardene Sayer, MD _ _ _   _ __   _ __ _ _  __ __   _ __   __ _  History of Present Illness  Jesse [name] is a 46 y.o. 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 He was with his wife around 85. Reported not feeling well. Wife sat him at the edge of the bed and handed him some water. Noted that his RUE was flopping around and then shortly afterwards, his L hand contracted and LUE was drawn in and then it started jerking. He kept of reporting that he was not feeling good. She called EMS and he was noted to be aphasia, had a left gaze and RLE weakness and he was brought in as a code stroke.  He had CTH which demonstrated prior R MCA stroke, CTA with no LVO, dissection study with stable dissection and MRI Brain which was negative for an acute stroke. While in the MRI, he had a second seizure. He was given Ativan 2mg  and loaded with Keppra 1500mg  IV once.  Wife reports that he drinks about 4 beers a night, was febrile last night but wouldn't let them check him temperature. He gets frequent UTIs.  LKW: 1930 mRS: 1 tNKASE: not offered, no stroke on MRI Thrombectomy: not offered, no stroke on MRI and no LVO NIHSS components Score: Comment  1a Level of Conscious 0[x]  1[]  2[]  3[]      1b LOC Questions 0[]  1[]  2[x]       1c LOC Commands 0[]  1[]  2[x]       2 Best Gaze 0[]  1[]  2[x]       3 Visual 0[]  1[]  2[x]  3[]      4 Facial Palsy 0[x]  1[]  2[]  3[]      5a Motor Arm - left 0[]  1[x]  2[]  3[]  4[]  UN[]    5b Motor Arm - Right 0[]  1[]  2[x]  3[]  4[]  UN[]    6a Motor Leg - Left 0[x]  1[]  2[]  3[]  4[]  UN[]    6b Motor Leg - Right 0[]  1[]  2[]  3[x]  4[]  UN[]    7 Limb  Ataxia 0[x]  1[]  2[]  3[]  UN[]     8 Sensory 0[x]  1[]  2[]  UN[]      9 Best Language 0[]  1[]  2[]  3[x]      10 Dysarthria 0[]  1[]  2[x]  UN[]      11 Extinct. and Inattention 0[x]  1[]  2[]       TOTAL: 19     ROS   Unable to obtain ROS 2/2 aphasia.  Past History   Past Medical History:  Diagnosis Date   Anxiety    Depression    Essential hypertension    Myocardial infarction (HCC)    S/P aortic dissection repair 11/05/2020   Straight graft replacement of ascending aorta and proximal transverse aortic arch with re-suspension of native aortic valve and open hemi arch distal anastomosis with aorta to right common carotid bypass and aorta to right subclavian bypass   Stroke [facility])    Past Surgical History:  Procedure Laterality Date   ASCENDING AORTIC ROOT REPLACEMENT N/A 11/04/2020   Procedure: 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 WITH REPLACEMENT OF ASCENDING AORTA AND HEMIARCH USING HEMASHIELD PLATINUM [facility] GRAFT  AND HEMASHIELD GOLD 14 X GRAFT, RESUSPENSION OF NATIVE VALVE, AORTA TO RIGHT CAROTID BYPASS, AORTA TO RIGHT SUBCLAVIAN BYPASS;  Surgeon: Purcell Nails, MD;  Location: [facility] OR;  Service: Open Heart Surgery;  Laterality: N/A;   TEE WITHOUT CARDIOVERSION N/A 11/04/2020   Procedure: TRANSESOPHAGEAL ECHOCARDIOGRAM (TEE);  Surgeon: Purcell Nails, MD;  Location: [facility] OR;  Service: Open Heart Surgery;  Laterality: N/A;   No family history on file. Social History   Socioeconomic History   Marital status: Married    Spouse name: Not on file   Number of children: Not on file   Years of education: Not on file   Highest education level: 9th grade  Occupational History   Not on file  Tobacco Use   Smoking status: Former    Types: Cigarettes    Quit date: 11/04/2020    Years since quitting: 1.0   Smokeless tobacco: Never  Vaping Use   Vaping Use: Never used  Substance and Sexual Activity   Alcohol use: Yes    Alcohol/week: 2.0 standard drinks of alcohol    Types: 2 Cans of  beer per week   Drug use: Not Currently   Sexual activity: Yes  Other Topics Concern   Not on file  Social History Narrative   Not on file   Social Determinants of Health   Financial Resource Strain: Not on file  Food Insecurity: Not on file  Transportation Needs: Not on file  Physical Activity: Not on file  Stress: Not on file  Social Connections: Not on file   No Known Allergies  Medications  (Not in a hospital admission)    Vitals   Vitals:   11/27/21 2000  Weight: 69 kg     Body mass index is 18.52 kg/m.  Physical Exam   General: Laying comfortably in bed; in no acute distress. HENT: Normal oropharynx and mucosa. Normal external appearance of ears and nose.  Neck: Supple, no pain or tenderness  CV: No JVD. No peripheral edema.  Pulmonary: Symmetric Chest rise. Normal respiratory effort.  Abdomen: Soft to touch, non-tender.  Ext: No cyanosis, edema, or deformity  Skin: No rash. Normal palpation of skin.   Musculoskeletal: Normal digits and nails by inspection. No clubbing.   Neurologic Examination  Mental status/Cognition: Alert, does not answer any orientation questions. Does not follow commands. Poor attention. Speech/language: mute, no speech, does not follow commands. Cranial nerves:   CN II Pupils equal and reactive to light, unable to assess for VF deficits. Does not blink to threat.   CN III,IV,VI L gaze deviation which improved to preference and was later able to cross midline.   CN V Unable to assess. Corneal intact BL   CN VII no asymmetry, no nasolabial fold flattening   CN VIII Does turn head towards speech   CN IX & X Unable to assess.   CN XI Head to the left, does turn to voice.   CN XII Unable to assess.   Motor:  Muscle bulk: poor, tone normal in all extremities, RLE is flaccid. Mvmt Root Nerve  Muscle Right Left Comments  SA C5/6 Ax Deltoid 3 4+   EF C5/6 Mc Biceps 4 4+   EE C6/7/8 Rad Triceps 4 4+   WF C6/7 Med FCR     WE C7/8 PIN  ECU     F Ab C8/T1 U ADM/FDI 3 4   HF L1/2/3 Fem Illopsoas 0 5   KE L2/3/4 Fem Quad 1  3   DF L4/5 D Peron Tib Ant 0 3   PF S1/2 Tibial Grc/Sol 0 4    Sensation:  Light touch Localizes to pinch in all extremities.   Pin prick    Temperature    Vibration   Proprioception    Coordination/Complex Motor:  Unable to assess but no obvious ataxia. Gait: Deferred for patient safety.  Labs   CBC:  Recent Labs  Lab 11/27/21 2021 11/27/21 2022  WBC 10.8*  --   NEUTROABS 7.8*  --   HGB 16.1 17.0  HCT 49.0 50.0  MCV 98.2  --   PLT 164  --     Basic Metabolic Panel:  Lab Results  Component Value Date   NA 139 11/27/2021   K 4.5 11/27/2021   CO2 18 (L) 11/27/2021   GLUCOSE 135 (H) 11/27/2021   BUN 13 11/27/2021   CREATININE 1.10 11/27/2021   CALCIUM 9.1 11/27/2021   GFRNONAA 60 (L) 11/27/2021   Lipid Panel:  Lab Results  Component Value Date   LDLCALC 49 11/06/2020   HgbA1c:  Lab Results  Component Value Date   HGBA1C 5.2 11/06/2020   Urine Drug Screen: No results found for: "LABOPIA", "COCAINSCRNUR", "LABBENZ", "AMPHETMU", "THCU", "LABBARB"  Alcohol Level     Component Value Date/Time   ETH <10 11/27/2021 2021    CT Head without contrast(Personally reviewed): Prior R MCA territory encephalomalacia, no ICH  CT angio Head and Neck with contrast(Personally reviewed): No LVO, no dissection.  MRI Brain(Personally reviewed): Limited DWI images with no stroke. He had a seizure in the MRI and thus had to be stopped.  cEEG:  pending  Impression   Jesse [name] is a 46 y.o. 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 He presents from home with focal seizure with potential scondary generalization with L hand turned in, followed by jerking and then worsening aphasia and R leg weakness on arrival. He had a second brief  seizure in the MRI.  Likely etiology of his seizure is prior R MCA stroke with encephalomalacia, daily alcohol use.  He is somnolent and aphasic and will put him up on cEEG to evaluate for further seizures.  Recommendations  - CBC, Chemistry, UA, Ucx to evaluate for any metabolic derangements, UTI. - cEEG. - seizure precautions. - Keppra 1500mg  IV once, folllowed by Keppra 500mg  BID. - Ativan 1-2mg  IV PRN for seizure lasting more than 3 mins. ______________________________________________________________________  This patient is critically ill and at significant risk of neurological worsening, death and care requires constant monitoring of vital signs, hemodynamics,respiratory and cardiac monitoring, neurological assessment, discussion with family, other specialists and medical decision making of high complexity. I spent 50 minutes of neurocritical care time  in the care of  this patient. This was time spent independent of any time provided by nurse practitioner or PA.  Donnetta Simpers [facility] Pager Number [phone] 11/27/2021  10:42 PM  Update:12:09 AM Reviewed cEEG which demonstrated focal R hemispheric status. - Ordered Depakote 20mg /Kg and will increase Keppra load to a total of 60mg /kg. - Increase maintenance Keppra to 1000mg  BID.  Thank you for the opportunity to take part in the care of this patient. If you have any further questions, please contact the neurology consultation attending.  Signed,  [place] Pager Number [phone] _ _ _   _  __   _ __ _ _  __ __   _ __   __ _  

## 2021-11-28 ENCOUNTER — Other Ambulatory Visit: Payer: Self-pay

## 2021-11-28 DIAGNOSIS — G40901 Epilepsy, unspecified, not intractable, with status epilepticus: Secondary | ICD-10-CM | POA: Diagnosis not present

## 2021-11-28 DIAGNOSIS — R4701 Aphasia: Secondary | ICD-10-CM | POA: Diagnosis not present

## 2021-11-28 DIAGNOSIS — R569 Unspecified convulsions: Secondary | ICD-10-CM | POA: Diagnosis not present

## 2021-11-28 LAB — COMPREHENSIVE METABOLIC PANEL
ALT: 18 U/L (ref 0–44)
AST: 26 U/L (ref 15–41)
Albumin: 3.7 g/dL (ref 3.5–5.0)
Alkaline Phosphatase: 59 U/L (ref 38–126)
Anion gap: 10 (ref 5–15)
BUN: 10 mg/dL (ref 6–20)
CO2: 25 mmol/L (ref 22–32)
Calcium: 8.9 mg/dL (ref 8.9–10.3)
Chloride: 102 mmol/L (ref 98–111)
Creatinine, Ser: 1.16 mg/dL (ref 0.61–1.24)
GFR, Estimated: >60 mL/min (ref >60)
Glucose, Bld: 112 mg/dL — ABNORMAL HIGH (ref 70–99)
Potassium: 4 mmol/L (ref 3.5–5.1)
Sodium: 137 mmol/L (ref 135–145)
Total Bilirubin: 0.7 mg/dL (ref 0.3–1.2)
Total Protein: 6.6 g/dL (ref 6.5–8.1)

## 2021-11-28 LAB — GLUCOSE, CAPILLARY
Glucose-Capillary: 104 mg/dL — ABNORMAL HIGH (ref 70–99)
Glucose-Capillary: 120 mg/dL — ABNORMAL HIGH (ref 70–99)
Glucose-Capillary: 99 mg/dL (ref 70–99)
Glucose-Capillary: 95 mg/dL (ref 70–99)

## 2021-11-28 LAB — CBC
HCT: 41.9 % (ref 39.0–52.0)
Hemoglobin: 14.9 g/dL (ref 13.0–17.0)
MCH: 33.1 pg (ref 26.0–34.0)
MCHC: 35.6 g/dL (ref 30.0–36.0)
MCV: 93.1 fL (ref 80.0–100.0)
Platelets: 150 10*3/uL (ref 150–400)
RBC: 4.5 MIL/uL (ref 4.22–5.81)
RDW: 12.4 % (ref 11.5–15.5)
WBC: 8.5 10*3/uL (ref 4.0–10.5)
nRBC: 0 % (ref 0.0–0.2)

## 2021-11-28 LAB — VITAMIN B12: Vitamin B-12: 450 pg/mL (ref 180–914)

## 2021-11-28 LAB — TSH: TSH: 1.206 u[IU]/mL (ref 0.350–4.500)

## 2021-11-28 LAB — AMMONIA: Ammonia: 40 umol/L — ABNORMAL HIGH (ref 9–35)

## 2021-11-28 LAB — HIV ANTIBODY (ROUTINE TESTING W REFLEX): HIV Screen 4th Generation wRfx: NONREACTIVE

## 2021-11-28 LAB — FOLATE: Folate: 10.4 ng/mL (ref >5.9)

## 2021-11-28 MED ORDER — LEVETIRACETAM 500 MG PO TABS
1000.0000 mg | ORAL_TABLET | Freq: Two times a day (BID) | ORAL | Status: DC
  Administered 2021-11-28 – 2021-11-30 (×5): 1000 mg via ORAL
  Filled 2021-11-28 (×5): qty 2

## 2021-11-28 MED ORDER — ENSURE ENLIVE PO LIQD: 237.0000 mL | Freq: Two times a day (BID) | ORAL | Status: DC

## 2021-11-28 MED ORDER — DEXTROSE 5 % IV SOLN
250.0000 mg | Freq: Four times a day (QID) | INTRAVENOUS | Status: AC
  Administered 2021-11-28 – 2021-11-29 (×5): 250 mg via INTRAVENOUS
  Filled 2021-11-28 (×6): qty 2.5

## 2021-11-28 MED ORDER — LEVETIRACETAM IN NACL 1000 MG/100ML IV SOLN
1000.0000 mg | Freq: Two times a day (BID) | INTRAVENOUS | Status: DC
  Filled 2021-11-28: qty 100

## 2021-11-28 MED ORDER — BOOST / RESOURCE BREEZE PO LIQD CUSTOM
1.0000 | Freq: Three times a day (TID) | ORAL | Status: DC
  Administered 2021-11-29 – 2021-11-30 (×3): 1 via ORAL

## 2021-11-28 NOTE — Procedures (Addendum)
Patient Name: Jesse [name]  MRN: [id number]  Epilepsy Attending: Charlsie Quest  Referring Physician/Provider: Erick Blinks, MD Duration: 11/27/2021 2136 to 11/28/2021 2136  Patient history: 46 y.o. 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 He presents from home with focal seizure with potential scondary generalization with L hand turned in, followed by jerking and then worsening aphasia and R leg weakness on arrival. He had a second brief seizure in the MRI. EEG to evaluate for seizure  Level of alertness:  lethargic  AEDs during EEG study: LEV, VPA  Technical aspects: This EEG study was done with scalp electrodes positioned according to the 10-20 International system of electrode placement. Electrical activity was reviewed with band pass filter of 1-70Hz , sensitivity of 7 uV/mm, display speed of 13mm/sec with a 60Hz  notched filter applied as appropriate. EEG data were recorded continuously and digitally stored.  Video monitoring was available and reviewed as appropriate.  Description: At the beginning of study, EEG showed spike and wave in right hemisphere at 2.5-3.5 Hz with evolution in morphology. No clinical signs were seen during study. This was consistent with electrographic status epilepticus. As medications were adjusted, status resolved after around 2250 on 11/27/2021. After that EEG showed continuous generalized and lateralized right hemisphere 5-7hz  theta slowing. Sleep was characterized by sleep spindles (12-14hz ), maximal fronto-central region. Hyperventilation and photic stimulation were not performed.      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 As medications were adjusted, status resolved after around 2250 on 11/27/2021. After that study was suggestive of cortical dysfunction arising from right hemisphere likely due to underlying stroke. Additionally there was moderate diffuse encephalopathy, non specific etiology.    Dr. 01/27/2022 was notified.  Solly Derasmo Derry Lory

## 2021-11-28 NOTE — Progress Notes (Signed)
Initial Nutrition Assessment  DOCUMENTATION CODES:   Not applicable  INTERVENTION:   -D/c Ensure Enlive po BID, each supplement provides 350 kcal and 20 grams of protein -Boost Breeze po TID, each supplement provides 250 kcal and 9 grams of protein  -MVI with minerals daily -Liberalize diet to 2 gram sodium for wider variety of meal selections  NUTRITION DIAGNOSIS:   Increased nutrient needs related to chronic illness (CAD) as evidenced by estimated needs.  GOAL:   Patient will meet greater than or equal to 90% of their needs  MONITOR:   PO intake, Supplement acceptance  REASON FOR ASSESSMENT:   Malnutrition Screening Tool    ASSESSMENT:   P twith history of alcohol abuse, smoker, history of right MCA stroke 1 year ago with residual aphasia, history of aortic dissection status postrepair, anxiety, depression, coronary artery disease, hypertension and GERD brought in with change in mental status and global aphasia.  Pt admitted with new onset seizure with stoke.   Reviewed I/O's: -590 ml x 24 hours  UOP: 1 L x 24 hours   Pt unavailable at time of visit. Attempted to speak with pt via call to hospital room phone, however, unable to reach. RD unable to obtain further nutrition-related history or complete nutrition-focused physical exam at this time.    Pt currently on a heart healthy diet. No meal completion data available to assess. Pt is refusing Ensure supplements.  Reviewed wt hx; no wt loss noted over the past 8 months.  Medications reviewed and include ferrous sulfate, folic acid, keppra, thiamine, and zinc sulfate.  Labs reviewed: CBGS: 104-120.  Diet Order:   Diet Order             Diet Heart Room service appropriate? Yes; Fluid consistency: Thin  Diet effective now                   EDUCATION NEEDS:   No education needs have been identified at this time  Skin:  Skin Assessment: Reviewed RN Assessment  Last BM:  Unknown  Height:   Ht  Readings from Last 1 Encounters:  11/27/21 6\' 4"  (1.93 m)    Weight:   Wt Readings from Last 1 Encounters:  11/27/21 71.1 kg   BMI:  Body mass index is 19.08 kg/m.  Estimated Nutritional Needs:   Kcal:  2100-2300  Protein:  105-120 grams  Fluid:  > 2 L    01/27/22, RD, LDN, CDCES Registered Dietitian II Certified Diabetes Care and Education Specialist Please refer to [facility] for RD and/or RD on-call/weekend/after hours pager

## 2021-11-28 NOTE — Progress Notes (Signed)
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  Patient had episode of unresponsiveness with abnormal eye movement and posturing of his left arm, called EMS.  He was noted to have left-sided gaze and right-sided weakness.  Code stroke was called.  CT head and MRI were negative, however had episode of witnessed seizure and MRI aborted with Ativan.  Admitted with neurology work-up.   Assessment & Plan:   New onset seizure in a patient with stroke: Currently neurologically stable. 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  Seizure aborted with Ativan.  Loaded with Keppra and currently remains on Keppra IV. Seizure precautions.  Delirium precautions.  Fall precautions.  Followed by neurology. MRI and CT head with no acute findings.  Alcohol use with risk of withdrawal: High risk of withdrawal.  On multivitamins.  CIWA protocol, seizure precautions as above.  Acute kidney injury: Improved.  We will discontinue IV fluids.  History of stroke with residual aphasia: Remains on aspirin, Coreg and statin.  Continued.  No new deficits.  Essential hypertension: Blood pressure stable on carvedilol, amlodipine.  Depression/anxiety: On valproic acid.  Takes Seroquel.  Continued.    DVT prophylaxis: heparin injection 5,000 Units Start: 11/27/21 2300   Code Status: Full code Family Communication: None Disposition Plan: Status is: Inpatient Remains inpatient appropriate because: Active investigations,  continuous EEG     Consultants:  Neurology  Procedures:  EEG  Antimicrobials:  None   Subjective: Patient seen and examined.  He has some baseline aphasia and difficulty expression.  Looked confused, however was able to express that he is okay.  He does not know what happened to him yesterday at home. EEG monitoring recorded ongoing epileptiform discharges last night and was aborted with IV Ativan. He addresses me "dad" repeatedly.  Objective: Vitals:   11/27/21 2300 11/28/21 0119 11/28/21 0405 11/28/21 0736  BP:  (!) 142/88 137/88 (!) 139/92  Pulse:  69 67 68  Resp:  20  16  Temp:  98.5 F (36.9 C) 97.7 F (36.5 C) 98.4 F (36.9 C)  TempSrc:  Oral Oral Oral  SpO2:  100% 100% 100%  Weight: 71.1 kg     Height: 6\' 4"  (1.93 m)       Intake/Output Summary (Last 24 hours) at 11/28/2021 0954 Last data filed at 11/28/2021 0558 Gross per 24 hour  Intake 409.58 ml  Output 1000 ml  Net -590.42 ml   Filed Weights   11/27/21 2000 11/27/21 2300  Weight: 69 kg 71.1 kg    Examination:  General exam: Appears calm and comfortable  Respiratory system: No added sounds. Cardiovascular system: S1 & S2 heard, RRR. No pedal edema. Gastrointestinal system: Abdomen is nondistended, soft and nontender. No organomegaly or masses felt. Normal bowel sounds heard.  Previous scar nontender. Central nervous system: Alert and awake.  Oriented x2-3. Extremities: Symmetric 5 x 5 power. Skin: No rashes, lesions or ulcers Psychiatry: Impaired judgment.  Flat affect.    Data Reviewed: I have personally reviewed following labs and imaging studies  CBC: Recent Labs  Lab  11/27/21 2021 11/27/21 2022 11/28/21 0249  WBC 10.8*  --  8.5  NEUTROABS 7.8*  --   --   HGB 16.1 17.0 14.9  HCT 49.0 50.0 41.9  MCV 98.2  --  93.1  PLT 164  --  150   Basic Metabolic Panel: Recent Labs  Lab 11/27/21 2021 11/27/21 2022 11/28/21 0249  NA 137 139 137  K 4.5 4.5 4.0  CL 104 104 102  CO2 18*  --   25  GLUCOSE 141* 135* 112*  BUN 13 13 10   CREATININE 1.45* 1.10 1.16  CALCIUM 9.1  --  8.9   GFR: Estimated Creatinine Clearance: 79.2 mL/min (by C-G formula based on SCr of 1.16 mg/dL). Liver Function Tests: Recent Labs  Lab 11/27/21 2021 11/28/21 0249  AST 30 26  ALT 16 18  ALKPHOS 62 59  BILITOT 0.8 0.7  PROT 6.9 6.6  ALBUMIN 4.0 3.7   No results for input(s): "LIPASE", "AMYLASE" in the last 168 hours. Recent Labs  Lab 11/28/21 0249  AMMONIA 40*   Coagulation Profile: Recent Labs  Lab 11/27/21 2021  INR 1.1   Cardiac Enzymes: No results for input(s): "CKTOTAL", "CKMB", "CKMBINDEX", "TROPONINI" in the last 168 hours. BNP (last 3 results) No results for input(s): "PROBNP" in the last 8760 hours. HbA1C: No results for input(s): "HGBA1C" in the last 72 hours. CBG: Recent Labs  Lab 11/28/21 0825  GLUCAP 104*   Lipid Profile: No results for input(s): "CHOL", "HDL", "LDLCALC", "TRIG", "CHOLHDL", "LDLDIRECT" in the last 72 hours. Thyroid Function Tests: Recent Labs    11/28/21 0249  TSH 1.206   Anemia Panel: Recent Labs    11/28/21 0249  VITAMINB12 450  FOLATE 10.4   Sepsis Labs: No results for input(s): "PROCALCITON", "LATICACIDVEN" in the last 168 hours.  No results found for this or any previous visit (from the past 240 hour(s)).       Radiology Studies: MR BRAIN WO CONTRAST  Result Date: 11/27/2021 CLINICAL DATA:  Neuro deficit, acute, stroke suspected. Mental status change, unknown cause. Aphasia and right-sided weakness. History of aortic dissection repair. EXAM: MRI HEAD WITHOUT CONTRAST TECHNIQUE: Multiplanar, multiecho pulse sequences of the brain and surrounding structures were obtained without intravenous contrast. COMPARISON:  Head CT 11/27/2021 and MRI 11/07/2020 FINDINGS: The examination had to be discontinued prior to completion due to the patient actively seizing. Axial and coronal diffusion, sagittal T1, axial T2 FLAIR, and axial  susceptibility weighted sequences were obtained. There is no evidence of an acute infarct, mass, midline shift, hydrocephalus, or extra-axial fluid collection. A moderately large chronic posterior right MCA infarct is again noted with associated hemosiderin deposition. There are also small chronic infarcts in the centrum semiovale/corona radiata bilaterally, left basal ganglia, and both cerebellar hemispheres. IMPRESSION: 1. Incomplete examination. No acute infarct. 2. Chronic infarcts as above. Electronically Signed   By: 11/09/2020 M.D.   On: 11/27/2021 21:11   CT ANGIO HEAD NECK W WO CM (CODE STROKE)  Result Date: 11/27/2021 CLINICAL DATA:  Neuro deficit, acute, stroke suspected. Aphasia and right-sided weakness. History of aortic dissection. EXAM: CT ANGIOGRAPHY HEAD AND NECK TECHNIQUE: Multidetector CT imaging of the head and neck was performed using the standard protocol during bolus administration of intravenous contrast. Multiplanar CT image reconstructions and MIPs were obtained to evaluate the vascular anatomy. Carotid stenosis measurements (when applicable) are obtained utilizing NASCET criteria, using the distal internal carotid diameter as the denominator. RADIATION DOSE REDUCTION: This exam was performed according to the  departmental dose-optimization program which includes automated exposure control, adjustment of the mA and/or kV according to patient size and/or use of iterative reconstruction technique. CONTRAST:  42mL OMNIPAQUE IOHEXOL 350 MG/ML SOLN COMPARISON:  Head and neck MRA 11/07/2020. Head and neck CTA 11/04/2020. FINDINGS: CTA NECK FINDINGS Aortic arch: More fully evaluated on the separate CTA of the chest, abdomen, and pelvis. The included portion of the aorta to right subclavian and right common carotid bypass graft is widely patent. Right carotid system: Patent without evidence of stenosis or dissection. Left carotid system: Patent with minimal calcified plaque in the proximal ICA.  No evidence of a significant stenosis or dissection. Vertebral arteries: Patent without evidence of stenosis or dissection. Strongly dominant right vertebral artery. Skeleton: Mild disc degeneration in the cervical spine. Focally advanced left facet arthrosis at C3-4. Other neck: No evidence of cervical lymphadenopathy or mass. Upper chest: Reported separately. Review of the MIP images confirms the above findings CTA HEAD FINDINGS Anterior circulation: The internal carotid arteries are widely patent from skull base to carotid termini. ACAs and MCAs are patent without evidence of a proximal branch occlusion or significant proximal stenosis. There is a decreased number of distal right MCA branch vessels in the region of the chronic infarct. No aneurysm is identified. Posterior circulation: The intracranial vertebral arteries are widely patent to the basilar. Patent PICA and SCA origins are seen bilaterally. The basilar artery is widely patent. There is a large left posterior communicating artery with hypoplastic left P1 segment. Both PCAs are patent without evidence of a significant proximal stenosis. No aneurysm is identified. Venous sinuses: Patent. Anatomic variants: Predominantly fetal origin of the left PCA. Review of the MIP images confirms the above findings IMPRESSION: No large vessel occlusion or significant proximal stenosis in the head or neck. These results were called by telephone at the time of interpretation on 11/27/2021 at 8:50 pm to Dr. Erick Blinks, who verbally acknowledged these results. Electronically Signed   By: Sebastian Ache M.D.   On: 11/27/2021 20:59   CT Angio Chest/Abd/Pel for Dissection W and/or Wo Contrast  Result Date: 11/27/2021 CLINICAL DATA:  Neurologic deficit, history of aortic dissection status post repair EXAM: CT ANGIOGRAPHY CHEST, ABDOMEN AND PELVIS TECHNIQUE: Non-contrast CT of the chest was initially obtained. Multidetector CT imaging through the chest, abdomen and  pelvis was performed using the standard protocol during bolus administration of intravenous contrast. Multiplanar reconstructed images and MIPs were obtained and reviewed to evaluate the vascular anatomy. RADIATION DOSE REDUCTION: This exam was performed according to the departmental dose-optimization program which includes automated exposure control, adjustment of the mA and/or kV according to patient size and/or use of iterative reconstruction technique. CONTRAST:  79mL OMNIPAQUE IOHEXOL 350 MG/ML SOLN COMPARISON:  08/12/2021 FINDINGS: CTA CHEST FINDINGS Cardiovascular: Stable postsurgical changes from prior type A aortic dissection repair, with interposition graft involving the ascending thoracic aorta, innominate artery, and origin of the right subclavian and right common carotid arteries as before. No evidence of recurrent dissection or aneurysm. The heart is unremarkable without pericardial effusion. Lung not optimized for opacification of the pulmonary vasculature, there is sufficient contrast enhancement to exclude central or segmental pulmonary emboli. No filling defects. Mediastinum/Nodes: No enlarged mediastinal, hilar, or axillary lymph nodes. Thyroid gland, trachea, and esophagus demonstrate no significant findings. Lungs/Pleura: No acute airspace disease, effusion, or pneumothorax. Central airways are patent. Musculoskeletal: No acute or destructive bony lesions. Reconstructed images demonstrate no additional findings. Review of the MIP images confirms the above findings. CTA  ABDOMEN AND PELVIS FINDINGS VASCULAR Aorta: Normal caliber aorta without aneurysm, dissection, vasculitis or significant stenosis. Mild diffuse atherosclerosis. Celiac: Patent without evidence of aneurysm, dissection, vasculitis or significant stenosis. SMA: Patent without evidence of aneurysm, dissection, vasculitis or significant stenosis. Renals: There are bilateral accessory renal arteries in addition to the bilateral main  renal arteries. Arteries are patent without evidence of aneurysm, dissection, vasculitis, fibromuscular dysplasia or significant stenosis. IMA: Patent without evidence of aneurysm, dissection, vasculitis or significant stenosis. Inflow: Patent without evidence of aneurysm, dissection, vasculitis or significant stenosis. Mild atherosclerosis. Veins: No obvious venous abnormality within the limitations of this arterial phase study. Review of the MIP images confirms the above findings. NON-VASCULAR Hepatobiliary: No focal liver abnormality is seen. No gallstones, gallbladder wall thickening, or biliary dilatation. Pancreas: Unremarkable. No pancreatic ductal dilatation or surrounding inflammatory changes. Spleen: Normal in size without focal abnormality. Adrenals/Urinary Tract: Adrenal glands are unremarkable. Kidneys are normal, without renal calculi, focal lesion, or hydronephrosis. Bladder is unremarkable. Stomach/Bowel: No bowel obstruction or ileus. Normal appendix right lower quadrant. No bowel wall thickening or inflammatory change. Lymphatic: No pathologic adenopathy. Reproductive: Prostate is unremarkable. Other: No free fluid or free intraperitoneal gas. No abdominal wall hernia. Musculoskeletal: No acute or destructive bony lesions. Reconstructed images demonstrate no additional findings. Review of the MIP images confirms the above findings. IMPRESSION: 1. Stable postoperative changes from type A thoracic aortic dissection repair. No evidence of recurrent dissection or aortic aneurysm. 2. No evidence of pulmonary embolus. 3. No acute intrathoracic, intra-abdominal, or intrapelvic process. 4.  Aortic Atherosclerosis (ICD10-I70.0). Electronically Signed   By: Sharlet Salina M.D.   On: 11/27/2021 20:54   CT HEAD CODE STROKE WO CONTRAST  Result Date: 11/27/2021 CLINICAL DATA:  Code stroke. Neuro deficit, acute, stroke suspected. EXAM: CT HEAD WITHOUT CONTRAST TECHNIQUE: Contiguous axial images were obtained  from the base of the skull through the vertex without intravenous contrast. RADIATION DOSE REDUCTION: This exam was performed according to the departmental dose-optimization program which includes automated exposure control, adjustment of the mA and/or kV according to patient size and/or use of iterative reconstruction technique. COMPARISON:  Head CT 11/18/2020 and MRI 11/07/2020 FINDINGS: Brain: A moderately large region of encephalomalacia is now present in the posterior right MCA territory reflecting evolution of the infarct which was acute last year. Small chronic cerebellar infarcts also correspond to acute infarcts last year. There is an unchanged chronic lacunar infarct at the posterior aspect of the left lentiform nucleus. No definite acute infarct, intracranial hemorrhage, mass, midline shift, hydrocephalus, or extra-axial fluid collection is identified. Vascular: No hyperdense vessel. Skull: No fracture or suspicious osseous lesion. Sinuses/Orbits: Visualized paranasal sinuses and mastoid air cells are clear. Unremarkable orbits. Other: None. IMPRESSION: 1. No evidence of acute intracranial abnormality. 2. Chronic infarcts as above. These results were communicated to Dr. Derry Lory at 8:29 pm on 11/27/2021 by text page via the [facility] messaging system. Electronically Signed   By: Sebastian Ache M.D.   On: 11/27/2021 20:30        Scheduled Meds:  amLODipine  2.5 mg Oral Daily   aspirin  81 mg Oral Daily   carvedilol  6.25 mg Oral BID WC   feeding supplement  237 mL Oral BID BM   ferrous sulfate  325 mg Oral Daily   folic acid  1 mg Oral Daily   heparin  5,000 Units Subcutaneous Q8H   levETIRAcetam  1,000 mg Oral Q12H   multivitamin with minerals  1 tablet Oral Daily  pantoprazole  40 mg Oral Daily   QUEtiapine  50 mg Oral QHS   rosuvastatin  20 mg Oral Daily   sodium chloride flush  3 mL Intravenous Once   sodium chloride flush  3 mL Intravenous Q12H   thiamine (VITAMIN B1) injection  100 mg  Intravenous Daily   Or   thiamine  100 mg Oral Daily   zinc sulfate  220 mg Oral Daily   Continuous Infusions:  levETIRAcetam     valproate sodium       LOS: 1 day    Time spent: 35 minutes    Dorcas Carrow, MD [facility] Pager [phone]

## 2021-11-28 NOTE — Progress Notes (Signed)
Neurology Progress Note  Brief HPI: 46 year old male with PMHx of HTN, depression, MI, frequent UTIs, EtOH use, prior history of type a aortic dissection s/p repair, R MCA stroke 2/2 dissection with residual visual deficit and mild aphasia who presented to the ED on 8/4 with left hand contracture, left upper extremity jerking followed by increased aphasia, left gaze, right lower extremity weakness.  Seizure activity witnessed while patient was being evaluated in MRI.  Subjective: Following with a seizure, patient was given 2 mg of Ativan and 1500 mg IV Keppra. Initial EEG overnight revealed patient in status epilepticus arising from the right hemisphere with resolution at 22:50 following Depakote administration and Keppra load.  No further seizures noted.  Exam: Vitals:   11/28/21 0405 11/28/21 0736  BP: 137/88 (!) 139/92  Pulse: 67 68  Resp:  16  Temp: 97.7 F (36.5 C) 98.4 F (36.9 C)  SpO2: 100% 100%   Gen: Laying in hospital bed, no acute distress Resp: non-labored breathing, no respiratory distress on room air Abd: soft, non-distended  Neuro: Mental Status: Asleep initially, wakes easily to voice.  He is no longer somnolent. He is alert to self, place, age.   He is unable to provide a clear and coherent history of the events leading to presentation.  Patient does have some word finding difficulties during evaluation.   No neglect is noted Cranial Nerves:PERRL, EOMI without gaze preference, face is symmetric resting and with movement, hearing is intact to voice, head is grossly midline, shoulder shrug is symmetric, tongue protrudes midline. Motor: Moves all extremities antigravity without asymmetry Sensory: Intact and symmetric throughout Gait: Deferred for patient safety  Pertinent Labs: CBC    Component Value Date/Time   WBC 8.5 11/28/2021 0249   RBC 4.50 11/28/2021 0249   HGB 14.9 11/28/2021 0249   HCT 41.9 11/28/2021 0249   PLT 150 11/28/2021 0249   MCV 93.1  11/28/2021 0249   MCH 33.1 11/28/2021 0249   MCHC 35.6 11/28/2021 0249   RDW 12.4 11/28/2021 0249   LYMPHSABS 2.2 11/27/2021 2021   MONOABS 0.5 11/27/2021 2021   EOSABS 0.3 11/27/2021 2021   BASOSABS 0.1 11/27/2021 2021   CMP     Component Value Date/Time   NA 137 11/28/2021 0249   K 4.0 11/28/2021 0249   CL 102 11/28/2021 0249   CO2 25 11/28/2021 0249   GLUCOSE 112 (H) 11/28/2021 0249   BUN 10 11/28/2021 0249   CREATININE 1.16 11/28/2021 0249   CALCIUM 8.9 11/28/2021 0249   PROT 6.6 11/28/2021 0249   ALBUMIN 3.7 11/28/2021 0249   AST 26 11/28/2021 0249   ALT 18 11/28/2021 0249   ALKPHOS 59 11/28/2021 0249   BILITOT 0.7 11/28/2021 0249   GFRNONAA >60 11/28/2021 0249   Urinalysis    Component Value Date/Time   COLORURINE YELLOW 11/27/2021 2144   APPEARANCEUR CLEAR 11/27/2021 2144   LABSPEC 1.016 11/27/2021 2144   PHURINE 5.0 11/27/2021 2144   GLUCOSEU NEGATIVE 11/27/2021 2144   HGBUR LARGE (A) 11/27/2021 2144   BILIRUBINUR NEGATIVE 11/27/2021 2144   KETONESUR NEGATIVE 11/27/2021 2144   PROTEINUR 30 (A) 11/27/2021 2144   UROBILINOGEN 1.0 12/23/2020 1501   NITRITE NEGATIVE 11/27/2021 2144   LEUKOCYTESUR NEGATIVE 11/27/2021 2144   Imaging Reviewed: EEG 8/4 - 8/5: "This study initially showed electrographic status epilepticus arising from right hemisphere. As medications were adjusted, status resolved after around 2250 on 11/27/2021. After that study was suggestive of cortical dysfunction arising from right hemisphere likely due to  underlying stroke. Additionally there was moderate diffuse encephalopathy, non specific etiology."  MRI brain 8/4: 1. Incomplete examination. No acute infarct. 2. Chronic infarcts as above.  CT head 8/4: 1. No evidence of acute intracranial abnormality. 2. Chronic infarcts as above.  CTA head and neck 8/4: No large vessel occlusion or significant proximal stenosis in the head or neck.  Assessment: 46 y.o. male with PMH significant for  HTN, depression, MI, prior hx of type A aortic dissection s/p repair in July 2022 and R MCA stroke 2/2 dissection with residual vision deficit and mild aphasia. He presents from home with focal seizure with potential scondary generalization with L hand turned in, followed by jerking and then worsening aphasia and R leg weakness on arrival. He had a second brief seizure in the MRI and was noted to be in status on initial cEEG placement.  Status resolved with Depakote and Keppra.   Likely etiology of his seizure is prior R MCA stroke with encephalomalacia, daily alcohol use.  There is no evidence of toxic-metabolic derangements for seizure provocation etiology.   Recommendations: -Continue EEG -Seizure precautions -Continue Keppra 1000 mg IV twice daily -Continue Depakote 250 mg IV every 6 hours - AEDs may be converted to po when he can safely swallow - Ammonia was 40 on admission, would recheck this in 3 days given initiation of VPA. Of note, hyperammonemia on VPA most commonly occurs within the first week but may not be seen for months after initiating therapy. For now since VPA worked so well to break his status, will not change. -Ativan1-2mg  IV PRN for seizure lasting more than 3 mins and notify neurology  Lanae Boast, AGACNP-BC [facility] [phone] Neurology Attending Attestation   I examined the patient and discussed plan with Ms. Toberman NP. Above note has been edited by me to reflect my findings and recommendations. Status resolved with LEV and VPA load. Mental status vastly improved today. Will continue EEG overnight, if no further clinical or electrographic seizures will d/c EEG and follow clinically.    Bing Neighbors, MD [facility] [phone]   If 7pm- 7am, please page neurology on call as listed in AMION.

## 2021-11-29 DIAGNOSIS — R569 Unspecified convulsions: Secondary | ICD-10-CM | POA: Diagnosis not present

## 2021-11-29 LAB — GLUCOSE, CAPILLARY
Glucose-Capillary: 110 mg/dL — ABNORMAL HIGH (ref 70–99)
Glucose-Capillary: 119 mg/dL — ABNORMAL HIGH (ref 70–99)
Glucose-Capillary: 85 mg/dL (ref 70–99)
Glucose-Capillary: 98 mg/dL (ref 70–99)

## 2021-11-29 LAB — URINE CULTURE: Culture: NO GROWTH

## 2021-11-29 MED ORDER — DIVALPROEX SODIUM 125 MG PO CSDR
500.0000 mg | DELAYED_RELEASE_CAPSULE | Freq: Two times a day (BID) | ORAL | Status: DC
  Administered 2021-11-29 – 2021-11-30 (×2): 500 mg via ORAL
  Filled 2021-11-29 (×2): qty 4

## 2021-11-29 NOTE — Procedures (Addendum)
Patient Name: Jesse [name]  MRN: [id number]  Epilepsy Attending: Charlsie Quest  Referring Physician/Provider: Erick Blinks, MD Duration: 11/28/2021 2136 to 11/29/2021  1112   Patient history: 46 y.o. 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 He presents from home with focal seizure with potential scondary generalization with L hand turned in, followed by jerking and then worsening aphasia and R leg weakness on arrival. He had a second brief seizure in the MRI. EEG to evaluate for seizure   Level of alertness:  awake, asleep   AEDs during EEG study: LEV, VPA   Technical aspects: This EEG study was done with scalp electrodes positioned according to the 10-20 International system of electrode placement. Electrical activity was reviewed with band pass filter of 1-70Hz , sensitivity of 7 uV/mm, display speed of 4mm/sec with a 60Hz  notched filter applied as appropriate. EEG data were recorded continuously and digitally stored.  Video monitoring was available and reviewed as appropriate.   Description:  EEG showed continuous generalized and lateralized right hemisphere 5-7hz  theta slowing. Sleep was characterized by sleep spindles (12-14hz ), maximal fronto-central region. Hyperventilation and photic stimulation were not performed.     ABNORMALITY - Continuous slow, generalized and lateralized right hemisphere   IMPRESSION: This study was suggestive of cortical dysfunction arising from right hemisphere likely due to underlying stroke. Additionally there was moderate diffuse encephalopathy, non specific etiology. No seizures were seen during this study.    Jesse [name] 

## 2021-11-29 NOTE — Progress Notes (Signed)
LTM EEG discontinued - no skin breakdown at unhook.   

## 2021-11-29 NOTE — Progress Notes (Signed)
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  Patient had episode of unresponsiveness with abnormal eye movement and posturing of his left arm, called EMS.  He was noted to have left-sided gaze and right-sided weakness.  Code stroke was called.  CT head and MRI were negative, however had episode of witnessed seizure and MRI aborted with Ativan.  Admitted with neurology work-up. Patient was noted to have a status epilepticus on continuous EEG.  Aborted with Keppra, Ativan and Depakote.   Assessment & Plan:   New onset seizure in a patient with stroke: Currently neurologically stable.  Still confused. 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  Seizure aborted with Ativan, Keppra and Depakote. Remains on Keppra, Depakote taper.  Ativan as needed.. Seizure precautions.  Delirium precautions.  Fall precautions.  Followed by neurology. MRI and CT head with no acute findings. Will start mobilizing with PT OT when the EEG is discontinued.  Alcohol use with risk of withdrawal: High risk of withdrawal.  On multivitamins.  CIWA protocol, seizure precautions as above.  So far uncomplicated.  Acute kidney injury: Improved.   History of stroke with residual aphasia: Remains on aspirin, Coreg and statin.  Continued.  No new deficits.  PT OT after EEG.  Essential hypertension: Blood pressure stable on carvedilol, amlodipine.  Depression/anxiety: On Seroquel.    DVT prophylaxis: heparin injection 5,000 Units  Start: 11/27/21 2300   Code Status: Full code Family Communication: None. Disposition Plan: Status is: Inpatient Remains inpatient appropriate because: Active investigations, continuous EEG     Consultants:  Neurology  Procedures:  EEG  Antimicrobials:  None   Subjective: Patient seen and examined.  No overnight events.  Himself denies any complaints.  He still having difficulty expressing some of this is chronic as per patient's wife.  Objective: Vitals:   11/29/21 0002 11/29/21 0312 11/29/21 0504 11/29/21 0806  BP: (!) 148/96 (!) 144/93  (!) 158/94  Pulse: 67 66  (!) 58  Resp: 16 20  19   Temp: 98.7 F (37.1 C) 98.4 F (36.9 C)  98.9 F (37.2 C)  TempSrc: Oral   Oral  SpO2: 100% 98%  100%  Weight:   65.8 kg   Height:        Intake/Output Summary (Last 24 hours) at 11/29/2021 1053 Last data filed at 11/28/2021 1730 Gross per 24 hour  Intake 120 ml  Output --  Net 120 ml    Filed Weights   11/27/21 2000 11/27/21 2300 11/29/21 0504  Weight: 69 kg 71.1 kg 65.8 kg    Examination:  General exam: Appears calm and comfortable  Respiratory system: No added sounds. Cardiovascular system: S1 & S2 heard, RRR. No pedal edema. Gastrointestinal system: Abdomen is nondistended, soft and nontender. No organomegaly or masses felt. Normal bowel sounds heard.  Previous scar nontender. Central nervous system: Alert and awake.  Oriented x2-3. Aphasic.  Repeats anything that is asked.   Data Reviewed: I have personally reviewed following labs and imaging studies  CBC: Recent Labs  Lab 11/27/21 2021 11/27/21 2022 11/28/21  0249  WBC 10.8*  --  8.5  NEUTROABS 7.8*  --   --   HGB 16.1 17.0 14.9  HCT 49.0 50.0 41.9  MCV 98.2  --  93.1  PLT 164  --  [id number]    Basic Metabolic Panel: Recent Labs  Lab 11/27/21 2021 11/27/21 2022 11/28/21 0249  NA 137 139 137  K 4.5 4.5 4.0  CL 104 104 102  CO2 18*  --  25  GLUCOSE 141* 135* 112*  BUN 13 13 10   CREATININE 1.45* 1.10  1.16  CALCIUM 9.1  --  8.9    GFR: Estimated Creatinine Clearance: 73.3 mL/min (by C-G formula based on SCr of 1.16 mg/dL). Liver Function Tests: Recent Labs  Lab 11/27/21 2021 11/28/21 0249  AST 30 26  ALT 16 18  ALKPHOS 62 59  BILITOT 0.8 0.7  PROT 6.9 6.6  ALBUMIN 4.0 3.7    No results for input(s): "LIPASE", "AMYLASE" in the last 168 hours. Recent Labs  Lab 11/28/21 0249  AMMONIA 40*    Coagulation Profile: Recent Labs  Lab 11/27/21 2021  INR 1.1    Cardiac Enzymes: No results for input(s): "CKTOTAL", "CKMB", "CKMBINDEX", "TROPONINI" in the last 168 hours. BNP (last 3 results) No results for input(s): "PROBNP" in the last 8760 hours. HbA1C: No results for input(s): "HGBA1C" in the last 72 hours. CBG: Recent Labs  Lab 11/28/21 0825 11/28/21 1130 11/28/21 1610 11/28/21 2131 11/29/21 0624  GLUCAP 104* 120* 99 95 110*    Lipid Profile: No results for input(s): "CHOL", "HDL", "LDLCALC", "TRIG", "CHOLHDL", "LDLDIRECT" in the last 72 hours. Thyroid Function Tests: Recent Labs    11/28/21 0249  TSH 1.206    Anemia Panel: Recent Labs    11/28/21 0249  VITAMINB12 450  FOLATE 10.4    Sepsis Labs: No results for input(s): "PROCALCITON", "LATICACIDVEN" in the last 168 hours.  Recent Results (from the past 240 hour(s))  Remove urinary catheter to obtain Straight Cath urine culture     Status: None   Collection Time: 11/27/21  9:21 PM   Specimen: In/Out Cath Urine  Result Value Ref Range Status   Specimen Description IN/OUT CATH URINE  Final   Special Requests NONE  Final   Culture   Final    NO GROWTH Performed at [facility], [address], [place], [place] [address]    Report Status 11/29/2021 FINAL  Final         Radiology Studies: MR BRAIN WO CONTRAST  Result Date: 11/27/2021 CLINICAL DATA:  Neuro deficit, acute, stroke suspected. Mental status change, unknown cause. Aphasia and right-sided weakness. History of aortic  dissection repair. EXAM: MRI HEAD WITHOUT CONTRAST TECHNIQUE: Multiplanar, multiecho pulse sequences of the brain and surrounding structures were obtained without intravenous contrast. COMPARISON:  Head CT 11/27/2021 and MRI 11/07/2020 FINDINGS: The examination had to be discontinued prior to completion due to the patient actively seizing. Axial and coronal diffusion, sagittal T1, axial T2 FLAIR, and axial susceptibility weighted sequences were obtained. There is no evidence of an acute infarct, mass, midline shift, hydrocephalus, or extra-axial fluid collection. A moderately large chronic posterior right MCA infarct is again noted with associated hemosiderin deposition. There are also small chronic infarcts in the centrum semiovale/corona radiata bilaterally, left basal ganglia, and both cerebellar hemispheres. IMPRESSION: 1. Incomplete examination. No acute infarct. 2. Chronic infarcts as above. Electronically Signed   By: Logan Bores M.D.   On: 11/27/2021 21:11   CT ANGIO HEAD  NECK W WO CM (CODE STROKE)  Result Date: 11/27/2021 CLINICAL DATA:  Neuro deficit, acute, stroke suspected. Aphasia and right-sided weakness. History of aortic dissection. EXAM: CT ANGIOGRAPHY HEAD AND NECK TECHNIQUE: Multidetector CT imaging of the head and neck was performed using the standard protocol during bolus administration of intravenous contrast. Multiplanar CT image reconstructions and MIPs were obtained to evaluate the vascular anatomy. Carotid stenosis measurements (when applicable) are obtained utilizing NASCET criteria, using the distal internal carotid diameter as the denominator. RADIATION DOSE REDUCTION: This exam was performed according to the departmental dose-optimization program which includes automated exposure control, adjustment of the mA and/or kV according to patient size and/or use of iterative reconstruction technique. CONTRAST:  35mL OMNIPAQUE IOHEXOL 350 MG/ML SOLN COMPARISON:  Head and neck MRA  11/07/2020. Head and neck CTA 11/04/2020. FINDINGS: CTA NECK FINDINGS Aortic arch: More fully evaluated on the separate CTA of the chest, abdomen, and pelvis. The included portion of the aorta to right subclavian and right common carotid bypass graft is widely patent. Right carotid system: Patent without evidence of stenosis or dissection. Left carotid system: Patent with minimal calcified plaque in the proximal ICA. No evidence of a significant stenosis or dissection. Vertebral arteries: Patent without evidence of stenosis or dissection. Strongly dominant right vertebral artery. Skeleton: Mild disc degeneration in the cervical spine. Focally advanced left facet arthrosis at C3-4. Other neck: No evidence of cervical lymphadenopathy or mass. Upper chest: Reported separately. Review of the MIP images confirms the above findings CTA HEAD FINDINGS Anterior circulation: The internal carotid arteries are widely patent from skull base to carotid termini. ACAs and MCAs are patent without evidence of a proximal branch occlusion or significant proximal stenosis. There is a decreased number of distal right MCA branch vessels in the region of the chronic infarct. No aneurysm is identified. Posterior circulation: The intracranial vertebral arteries are widely patent to the basilar. Patent PICA and SCA origins are seen bilaterally. The basilar artery is widely patent. There is a large left posterior communicating artery with hypoplastic left P1 segment. Both PCAs are patent without evidence of a significant proximal stenosis. No aneurysm is identified. Venous sinuses: Patent. Anatomic variants: Predominantly fetal origin of the left PCA. Review of the MIP images confirms the above findings IMPRESSION: No large vessel occlusion or significant proximal stenosis in the head or neck. These results were called by telephone at the time of interpretation on 11/27/2021 at 8:50 pm to Dr. Erick Blinks, who verbally acknowledged these  results. Electronically Signed   By: Sebastian Ache M.D.   On: 11/27/2021 20:59   CT Angio Chest/Abd/Pel for Dissection W and/or Wo Contrast  Result Date: 11/27/2021 CLINICAL DATA:  Neurologic deficit, history of aortic dissection status post repair EXAM: CT ANGIOGRAPHY CHEST, ABDOMEN AND PELVIS TECHNIQUE: Non-contrast CT of the chest was initially obtained. Multidetector CT imaging through the chest, abdomen and pelvis was performed using the standard protocol during bolus administration of intravenous contrast. Multiplanar reconstructed images and MIPs were obtained and reviewed to evaluate the vascular anatomy. RADIATION DOSE REDUCTION: This exam was performed according to the departmental dose-optimization program which includes automated exposure control, adjustment of the mA and/or kV according to patient size and/or use of iterative reconstruction technique. CONTRAST:  74mL OMNIPAQUE IOHEXOL 350 MG/ML SOLN COMPARISON:  08/12/2021 FINDINGS: CTA CHEST FINDINGS Cardiovascular: Stable postsurgical changes from prior type A aortic dissection repair, with interposition graft involving the ascending thoracic aorta, innominate artery, and origin of the right subclavian and right common  carotid arteries as before. No evidence of recurrent dissection or aneurysm. The heart is unremarkable without pericardial effusion. Lung not optimized for opacification of the pulmonary vasculature, there is sufficient contrast enhancement to exclude central or segmental pulmonary emboli. No filling defects. Mediastinum/Nodes: No enlarged mediastinal, hilar, or axillary lymph nodes. Thyroid gland, trachea, and esophagus demonstrate no significant findings. Lungs/Pleura: No acute airspace disease, effusion, or pneumothorax. Central airways are patent. Musculoskeletal: No acute or destructive bony lesions. Reconstructed images demonstrate no additional findings. Review of the MIP images confirms the above findings. CTA ABDOMEN AND  PELVIS FINDINGS VASCULAR Aorta: Normal caliber aorta without aneurysm, dissection, vasculitis or significant stenosis. Mild diffuse atherosclerosis. Celiac: Patent without evidence of aneurysm, dissection, vasculitis or significant stenosis. SMA: Patent without evidence of aneurysm, dissection, vasculitis or significant stenosis. Renals: There are bilateral accessory renal arteries in addition to the bilateral main renal arteries. Arteries are patent without evidence of aneurysm, dissection, vasculitis, fibromuscular dysplasia or significant stenosis. IMA: Patent without evidence of aneurysm, dissection, vasculitis or significant stenosis. Inflow: Patent without evidence of aneurysm, dissection, vasculitis or significant stenosis. Mild atherosclerosis. Veins: No obvious venous abnormality within the limitations of this arterial phase study. Review of the MIP images confirms the above findings. NON-VASCULAR Hepatobiliary: No focal liver abnormality is seen. No gallstones, gallbladder wall thickening, or biliary dilatation. Pancreas: Unremarkable. No pancreatic ductal dilatation or surrounding inflammatory changes. Spleen: Normal in size without focal abnormality. Adrenals/Urinary Tract: Adrenal glands are unremarkable. Kidneys are normal, without renal calculi, focal lesion, or hydronephrosis. Bladder is unremarkable. Stomach/Bowel: No bowel obstruction or ileus. Normal appendix right lower quadrant. No bowel wall thickening or inflammatory change. Lymphatic: No pathologic adenopathy. Reproductive: Prostate is unremarkable. Other: No free fluid or free intraperitoneal gas. No abdominal wall hernia. Musculoskeletal: No acute or destructive bony lesions. Reconstructed images demonstrate no additional findings. Review of the MIP images confirms the above findings. IMPRESSION: 1. Stable postoperative changes from type A thoracic aortic dissection repair. No evidence of recurrent dissection or aortic aneurysm. 2. No  evidence of pulmonary embolus. 3. No acute intrathoracic, intra-abdominal, or intrapelvic process. 4.  Aortic Atherosclerosis (ICD10-I70.0). Electronically Signed   By: Sharlet Salina M.D.   On: 11/27/2021 20:54   CT HEAD CODE STROKE WO CONTRAST  Result Date: 11/27/2021 CLINICAL DATA:  Code stroke. Neuro deficit, acute, stroke suspected. EXAM: CT HEAD WITHOUT CONTRAST TECHNIQUE: Contiguous axial images were obtained from the base of the skull through the vertex without intravenous contrast. RADIATION DOSE REDUCTION: This exam was performed according to the departmental dose-optimization program which includes automated exposure control, adjustment of the mA and/or kV according to patient size and/or use of iterative reconstruction technique. COMPARISON:  Head CT 11/18/2020 and MRI 11/07/2020 FINDINGS: Brain: A moderately large region of encephalomalacia is now present in the posterior right MCA territory reflecting evolution of the infarct which was acute last year. Small chronic cerebellar infarcts also correspond to acute infarcts last year. There is an unchanged chronic lacunar infarct at the posterior aspect of the left lentiform nucleus. No definite acute infarct, intracranial hemorrhage, mass, midline shift, hydrocephalus, or extra-axial fluid collection is identified. Vascular: No hyperdense vessel. Skull: No fracture or suspicious osseous lesion. Sinuses/Orbits: Visualized paranasal sinuses and mastoid air cells are clear. Unremarkable orbits. Other: None. IMPRESSION: 1. No evidence of acute intracranial abnormality. 2. Chronic infarcts as above. These results were communicated to Dr. Derry Lory at 8:29 pm on 11/27/2021 by text page via the [facility] messaging system. Electronically Signed   By: Freida Busman  Jeralyn Ruths M.D.   On: 11/27/2021 20:30        Scheduled Meds:  amLODipine  2.5 mg Oral Daily   aspirin  81 mg Oral Daily   carvedilol  6.25 mg Oral BID WC   divalproex  500 mg Oral Q12H   feeding supplement   1 Container Oral TID BM   ferrous sulfate  325 mg Oral Daily   folic acid  1 mg Oral Daily   heparin  5,000 Units Subcutaneous Q8H   levETIRAcetam  1,000 mg Oral Q12H   multivitamin with minerals  1 tablet Oral Daily   pantoprazole  40 mg Oral Daily   QUEtiapine  50 mg Oral QHS   rosuvastatin  20 mg Oral Daily   sodium chloride flush  3 mL Intravenous Once   sodium chloride flush  3 mL Intravenous Q12H   thiamine (VITAMIN B1) injection  100 mg Intravenous Daily   Or   thiamine  100 mg Oral Daily   zinc sulfate  220 mg Oral Daily   Continuous Infusions:  valproate sodium 250 mg (11/29/21 0515)     LOS: 2 days    Time spent: 35 minutes    Barb Merino, MD [facility] Pager [phone]

## 2021-11-29 NOTE — Plan of Care (Addendum)
Neurology plan of care  Please see neurology progress note from yesterday for full findings and recommendations. Patient presented with multiple focal sz with secondary generalization and was found to be in R hemispheric status epilepticus on EEG 36 hrs ago. Status broke after loading with keppra and depakote, both of which have been continued. On examination yesterday he was back to mental status and neurologic baseline. He has had no further electrographic seizures and EEG will be unhooked today.   Updated recommendations: - Continue keppra 1000mg  po twice daily - Convert depacon IV to depakote DR sprinkle 500mg  po q 12 hrs - Prescribe both keppra and depakote at discharge w/ above doses - Patient should have depakote level and ammonia checked in 1-2 wks with neurologist or PCP - Patient may f/u with established outpatient neurologist Dr. - If patient remains seizure free x3-6 mos, consider weaning depakote as o/p. Will d/c him on both AEDs at this time since he presented in focal status - Please counsel patient at discharge that per [place] DMV no driving x6 mos after last seizure  No further inpatient neurologic workup indicated. Neurology to sign off, but please re-engage if additional neurologic concerns arise.  , MD [facility] [phone]  If 7pm- 7am, please page neurology on call as listed in AMION.

## 2021-11-30 DIAGNOSIS — R569 Unspecified convulsions: Secondary | ICD-10-CM | POA: Diagnosis not present

## 2021-11-30 LAB — GLUCOSE, CAPILLARY
Glucose-Capillary: 101 mg/dL — ABNORMAL HIGH (ref 70–99)
Glucose-Capillary: 117 mg/dL — ABNORMAL HIGH (ref 70–99)
Glucose-Capillary: 80 mg/dL (ref 70–99)

## 2021-11-30 MED ORDER — DIVALPROEX SODIUM 500 MG PO DR TAB: 500.0000 mg | DELAYED_RELEASE_TABLET | Freq: Two times a day (BID) | ORAL | 2 refills | Status: DC

## 2021-11-30 MED ORDER — LEVETIRACETAM 1000 MG PO TABS
1000.0000 mg | ORAL_TABLET | Freq: Two times a day (BID) | ORAL | 2 refills | Status: DC
Start: 2021-11-30 — End: 2022-03-03

## 2021-11-30 MED ORDER — ADULT MULTIVITAMIN W/MINERALS CH: 1.0000 | ORAL_TABLET | Freq: Every day | ORAL | 2 refills | Status: AC

## 2021-11-30 NOTE — Evaluation (Signed)
Physical Therapy Evaluation  Patient Details Name: Jesse [name] MRN: [id number] DOB: 1975/07/28 Today's Date: 11/30/2021  History of Present Illness  Pt is a 46 y.o. male presenting to [facility] ED on 11/27/2021 with seizure. Pt with aphasia, L gaze, and RLE weakness on arrival, and code stroke activated. MRI negative for acute infarct. 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   Clinical Impression  Pt admitted with above diagnosis. Pt currently with functional limitations due to the deficits listed below (see PT Problem List). At the time of PT eval pt was able to perform transfers and ambulation with intermittent min assist for balance support and safety without an AD. Pt scored 15/24 on the DGI indicating he is at a high risk for falls at this time. Wife present and supportive throughout session. Recommend outpatient PT follow up to maximize functional independence and to decrease risk for falls. Acutely, pt will benefit from skilled PT to increase their independence and safety with mobility to allow discharge to the venue listed below.          Recommendations for follow up therapy are one component of a multi-disciplinary discharge planning process, led by the attending physician.  Recommendations may be updated based on patient status, additional functional criteria and insurance authorization.  Follow Up Recommendations Outpatient PT      Assistance Recommended at Discharge Frequent or constant Supervision/Assistance  Patient can return home with the following  A little help with walking and/or transfers;A little help with bathing/dressing/bathroom;Assistance with cooking/housework;Assist for transportation;Help with stairs or ramp for entrance    Equipment Recommendations None recommended by PT  Recommendations for Other Services       Functional Status Assessment Patient has had a recent decline in their functional status and  demonstrates the ability to make significant improvements in function in a reasonable and predictable amount of time.     Precautions / Restrictions Precautions Precautions: Fall;Other (comment) Precaution Comments: seizure Restrictions Weight Bearing Restrictions: No      Mobility  Bed Mobility               General bed mobility comments: Pt was received sitting up in recliner upon PT arrival.    Transfers Overall transfer level: Needs assistance Equipment used: None Transfers: Sit to/from Stand Sit to Stand: Supervision, Min guard           General transfer comment: Close supervision to min guard assist for safety - Pt impulsive to stand and moves quickly at times but no overt LOB noted.    Ambulation/Gait Ambulation/Gait assistance: Min guard, Min assist Gait Distance (Feet): 400 Feet Assistive device: None Gait Pattern/deviations: Step-through pattern, Decreased stride length, Drifts right/left, Leaning posteriorly Gait velocity: Decreased Gait velocity interpretation: 1.31 - 2.62 ft/sec, indicative of limited community ambulator   General Gait Details: Unsteady and with difficulty avoiding obstacles in the hall 2 baseline vision deficits. Occasional posterior lean. Hands on guarding and intermittent assist provided throughout.  Stairs Stairs: Yes Stairs assistance: Min guard Stair Management: Two rails, Alternating pattern, Forwards Number of Stairs: 5 General stair comments: Practice stairs in the gym as part of the DGI. No assist required however hands on guarding provided for safety.  Wheelchair Mobility    Modified Rankin (Stroke Patients Only)       Balance Overall balance assessment: Needs assistance Sitting-balance support: Feet supported, No upper extremity supported Sitting balance-Leahy Scale: Good     Standing balance support: No upper extremity  supported, During functional activity Standing balance-Leahy Scale: Poor Standing  balance comment: Occasional assist required                 Standardized Balance Assessment Standardized Balance Assessment : Dynamic Gait Index   Dynamic Gait Index Level Surface: Mild Impairment Change in Gait Speed: Mild Impairment Gait with Horizontal Head Turns: Mild Impairment Gait with Vertical Head Turns: Mild Impairment Gait and Pivot Turn: Normal Step Over Obstacle: Mild Impairment Step Around Obstacles: Severe Impairment Steps: Mild Impairment Total Score: 15       Pertinent Vitals/Pain Pain Assessment Pain Assessment: Faces Faces Pain Scale: No hurt    Home Living Family/patient expects to be discharged to:: Private residence Living Arrangements: Spouse/significant other Available Help at Discharge: Family;Available 24 hours/day Type of Home: House Home Access: Level entry       Home Layout: Able to live on main level with bedroom/bathroom Home Equipment: Rolling Walker (2 wheels);Cane - single point;BSC/3in1 Additional Comments: Pt attempting to share home information, however, wife present and answering questions.    Prior Function Prior Level of Function : Needs assist             Mobility Comments: Pt wife reporting he has AD, but has not been using ADLs Comments: Pt and wife reporting that he was bathing and dressing independently and recently began mowing again. Pt wife reports that she drives him to appts/etc, and helps with medication management. Pt wife reports she frequently helps with ADL more than he actually needs (might cut his food for him even if he does not phyicially/cognitively need assistance)     Hand Dominance   Dominant Hand: Right    Extremity/Trunk Assessment   Upper Extremity Assessment Upper Extremity Assessment: Defer to OT evaluation LUE Deficits / Details: LUE 4+/5 strength. Pt with skill to perform finger opposition with increased time. Required additional verbal cues due to difficulty visualizing therapist  demonstration. (RUE 5/5)    Lower Extremity Assessment Lower Extremity Assessment: Overall WFL for tasks assessed (Strength 4+/5 to 5/5 with MMT in quads, hip flexors, hamstrings, ankle DF. Pt denies N/T in LE's.)    Cervical / Trunk Assessment Cervical / Trunk Assessment: Normal (Forward head posture with rounded shoulders)  Communication   Communication: Expressive difficulties;Receptive difficulties  Cognition Arousal/Alertness: Awake/alert Behavior During Therapy: WFL for tasks assessed/performed Overall Cognitive Status: Impaired/Different from baseline Area of Impairment: Attention, Memory, Following commands, Safety/judgement, Awareness, Problem solving                   Current Attention Level: Sustained Memory: Decreased short-term memory Following Commands: Follows one step commands with increased time, Follows one step commands consistently, Follows multi-step commands inconsistently Safety/Judgement: Decreased awareness of safety, Decreased awareness of deficits Awareness: Intellectual Problem Solving: Requires verbal cues, Requires tactile cues, Difficulty sequencing General Comments: Difficulty with higher level tasks that require sequencing. Had difficulty processing tasks for DGI even after demonstration from therapist.        General Comments General comments (skin integrity, edema, etc.): Wife present and states she feels pt's speech has declined and is asking for a speech therapy assessment. MD notified and order placed.    Exercises     Assessment/Plan    PT Assessment Patient needs continued PT services  PT Problem List Decreased strength;Decreased activity tolerance;Decreased balance;Decreased mobility;Decreased knowledge of use of DME;Decreased safety awareness;Decreased knowledge of precautions;Pain;Decreased cognition       PT Treatment Interventions DME instruction;Gait training;Functional mobility training;Therapeutic activities;Therapeutic  exercise;Balance training;Neuromuscular re-education;Cognitive remediation;Patient/family education    PT Goals (Current goals can be found in the Care Plan section)  Acute Rehab PT Goals Patient Stated Goal: None stated. Wife would like pt to return to baseline PT Goal Formulation: With patient/family Time For Goal Achievement: 12/14/21 Potential to Achieve Goals: Good Additional Goals Additional Goal #1: Pt will score >19/24 on the DGI to indicate a lower risk for falls.    Frequency Min 3X/week     Co-evaluation               AM-PAC PT "6 Clicks" Mobility  Outcome Measure Help needed turning from your back to your side while in a flat bed without using bedrails?: None Help needed moving from lying on your back to sitting on the side of a flat bed without using bedrails?: None Help needed moving to and from a bed to a chair (including a wheelchair)?: A Little Help needed standing up from a chair using your arms (e.g., wheelchair or bedside chair)?: A Little Help needed to walk in hospital room?: A Little Help needed climbing 3-5 steps with a railing? : A Little 6 Click Score: 20    End of Session Equipment Utilized During Treatment: Gait belt Activity Tolerance: Patient tolerated treatment well Patient left: in chair;with call bell/phone within reach;with family/visitor present Nurse Communication: Mobility status PT Visit Diagnosis: Unsteadiness on feet (R26.81);Other symptoms and signs involving the nervous system (R29.898)    Time: 1287-8676 PT Time Calculation (min) (ACUTE ONLY): 22 min   Charges:   PT Evaluation $PT Eval Moderate Complexity: 1 Mod          Conni Slipper, PT, DPT Acute Rehabilitation Services Secure Chat Preferred Office: [phone]   Marylynn Pearson 11/30/2021, 1:11 PM

## 2021-11-30 NOTE — Evaluation (Signed)
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   AKI (acute kidney injury) (HCC)    Benign essential HTN    Hyponatremia    Acute blood loss anemia    Urinary retention    Pressure injury of skin 11/17/2020   Respiratory arrest (HCC) 11/14/2020   Acute respiratory failure with hypoxia (HCC) 11/14/2020   Adverse effects of medication 11/14/2020   Hypotension 11/14/2020   Encounter for central line placement    Aortic dissection (HCC) 11/13/2020   Acute ischemic right MCA stroke (HCC) 11/13/2020   Agitation 11/13/2020   Atelectasis    Phlebitis    Polysubstance abuse (HCC)    Elevated troponin 11/07/2020   New onset left bundle branch block (LBBB) 11/07/2020   Protein-calorie malnutrition, severe 11/07/2020   Encephalopathy acute    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)    Acute hypoxemic respiratory failure (HCC)    Acute thoracic aortic dissection (HCC) 11/05/2020   S/P aortic dissection repair 11/05/2020   Cerebral embolism with cerebral infarction 11/05/2020   Essential hypertension    Past Medical History:  Past Medical History:  Diagnosis Date   Anxiety    Depression    Essential hypertension    Myocardial infarction (HCC)    S/P aortic dissection repair 11/05/2020   Straight graft replacement of ascending aorta and proximal transverse aortic arch with re-suspension of native aortic valve and open hemi arch distal anastomosis with aorta to right common carotid bypass and aorta to right subclavian bypass   Stroke [facility])    Past Surgical History:  Past Surgical History:  Procedure Laterality Date   ASCENDING AORTIC ROOT  REPLACEMENT N/A 11/04/2020   Procedure: 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 WITH REPLACEMENT OF ASCENDING AORTA AND HEMIARCH USING HEMASHIELD PLATINUM GRAFT AND HEMASHIELD GOLD 14 X GRAFT, RESUSPENSION OF NATIVE VALVE, AORTA TO RIGHT CAROTID BYPASS, AORTA TO RIGHT SUBCLAVIAN BYPASS;  Surgeon: Purcell Nails, MD;  Location: [facility] OR;  Service: Open Heart Surgery;  Laterality: N/A;   TEE WITHOUT CARDIOVERSION N/A 11/04/2020   Procedure: TRANSESOPHAGEAL ECHOCARDIOGRAM (TEE);  Surgeon: Purcell Nails, MD;  Location: [facility] OR;  Service: Open Heart Surgery;  Laterality: N/A;   HPI:  Pt is a 46 y.o. male presenting to [facility] ED on 11/27/2021 with seizure. Pt with aphasia, L gaze, and RLE weakness on arrival, and code stroke activated. MRI negative for acute infarct. Received OP SLP services for aphasia/apraxia, d/c'd 08/19/21. 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   Assessment / Plan / Recommendation Clinical Impression  Pt with hx of CVA with residual aphasia/apraxia, seen for speech-language evaluation for concern of exacerbation of language deficits post seizure. Wife, present for session, reports that pt is also laughing at inappropriate times with suspected decline in language comprehension. Pt is oriented x3 and engaged in simple conversation with clinician exhibiting some halting of speech, word finding and few instances of phonemic paraphasias. During confrontational naming task, he named 4/7 objects independently benefitting from semantic, phonemic and sentence completion cues to name 3/7. He demonstrated emergent awareness of semantic/phonemic parphasias during this task and intermittently  corrected. He demonstrated difficulty identifying named body parts (1/5) and following 1 step commands (50%), but identified items in room via pointing and answered y/n questions with 100% accuracy. Wife reported that  he would typically perform better on these type tasks PTA. Given clinical presentation and reports of changes from baseline, recommend SLP f/u for treatment of expressive/receptive language. He would benefit from OP SLP services upon discharge.    SLP Assessment  SLP Recommendation/Assessment: Patient needs continued Speech Lanaguage Pathology Services SLP Visit Diagnosis: Aphasia (R47.01);Apraxia (R48.2)    Recommendations for follow up therapy are one component of a multi-disciplinary discharge planning process, led by the attending physician.  Recommendations may be updated based on patient status, additional functional criteria and insurance authorization.    Follow Up Recommendations  Outpatient SLP    Assistance Recommended at Discharge  Set up Supervision/Assistance  Functional Status Assessment Patient has had a recent decline in their functional status and demonstrates the ability to make significant improvements in function in a reasonable and predictable amount of time.  Frequency and Duration min 2x/week  2 weeks      SLP Evaluation Cognition  Overall Cognitive Status: Difficult to assess Arousal/Alertness: Awake/alert Orientation Level: Oriented to person;Oriented to place;Oriented to time;Disoriented to situation Year: 2023 Month: August Day of Week: Correct Attention: Sustained Sustained Attention: Appears intact       Comprehension  Auditory Comprehension Overall Auditory Comprehension: Impaired Yes/No Questions: Within Functional Limits Commands: Impaired One Step Basic Commands: 50-74% accurate Two Step Basic Commands: 0-24% accurate Conversation: Simple Interfering Components: Processing speed EffectiveTechniques: Extra processing time Visual Recognition/Discrimination Discrimination: Not tested Reading Comprehension Reading Status: Within funtional limits (word level stimuli presented only due to time constraints)    Expression Expression Primary Mode  of Expression: Verbal Verbal Expression Overall Verbal Expression: Impaired Initiation: No impairment Automatic Speech: Name;Social Response Level of Generative/Spontaneous Verbalization: Sentence;Conversation Naming: Impairment Responsive: Not tested Confrontation: Impaired (4/7 correct) Verbal Errors: Phonemic paraphasias;Aware of errors Pragmatics: Impairment Impairments: Other (comment) (laughing at inappropriate times) Effective Techniques: Semantic cues;Sentence completion;Phonemic cues Written Expression Dominant Hand: Right Written Expression: Not tested   Oral / Motor  Oral Motor/Sensory Function Overall Oral Motor/Sensory Function: Within functional limits Motor Speech Overall Motor Speech: Impaired at baseline Motor Planning: Impaired Level of Impairment: Word Motor Speech Errors: Groping for words;Aware;Inconsistent Effective Techniques: Slow rate             Avie Echevaria, MA, CCC-SLP Acute Rehabilitation Services Office Number: [phone]  Paulette Blanch 11/30/2021, 1:08 PM

## 2021-11-30 NOTE — Progress Notes (Signed)
Discharged to home after IV access removed and discharge instructions given.  All questions answered.   ?

## 2021-11-30 NOTE — Evaluation (Signed)
Occupational Therapy Evaluation Patient Details Name: Jesse [name] MRN: [id number] DOB: 1975-10-28 Today's Date: 11/30/2021   History of Present Illness Pt is a 46 y.o. male presenting to [facility] ED on 11/27/2021 with seizure. Pt with aphasia, L gaze, and RLE weakness on arrival, and code stroke activated. MRI negative for acute infarct. 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   Clinical Impression   PTA, pt lived with his wife who assisted with IADL and intermittently with ADL. Currently, pt performing LB ADL with min guard A and functional mobility with min guard -min A due to decreased balance. Pt requiring mod multimodal cues for hand hygiene this session due to decreased familiarity with environment and use of visual compensatory techniques. Pt with attempts at communication throughout session, however, significantly increased time for word finding, and frequently saying "yes" or "same". Pt following one step commands consistently with increased time. Pt presents with decreased vision, balance, coordination, cognition, expressive, and receptive language. Agree with MD recommendation for OP OT to optimize safety and independence with ADL and IADL.      Recommendations for follow up therapy are one component of a multi-disciplinary discharge planning process, led by the attending physician.  Recommendations may be updated based on patient status, additional functional criteria and insurance authorization.   Follow Up Recommendations  Outpatient OT    Assistance Recommended at Discharge Frequent or constant Supervision/Assistance  Patient can return home with the following A little help with walking and/or transfers;A little help with bathing/dressing/bathroom;Assist for transportation;Direct supervision/assist for financial management;Direct supervision/assist for medications management;Assistance with cooking/housework    Functional  Status Assessment  Patient has had a recent decline in their functional status and demonstrates the ability to make significant improvements in function in a reasonable and predictable amount of time.  Equipment Recommendations  None recommended by OT (Pt has all recommended equipment)    Recommendations for Other Services Speech consult     Precautions / Restrictions Precautions Precautions: Fall;Other (comment) Precaution Comments: seizure Restrictions Weight Bearing Restrictions: No      Mobility Bed Mobility               General bed mobility comments: Sitting EOB on arrival    Transfers Overall transfer level: Needs assistance Equipment used: None Transfers: Sit to/from Stand Sit to Stand: Min guard           General transfer comment: Min guard A for safety      Balance                                           ADL either performed or assessed with clinical judgement   ADL Overall ADL's : Needs assistance/impaired     Grooming: Moderate assistance;Standing;Cueing for sequencing;Cueing for compensatory techniques Grooming Details (indicate cue type and reason): Mod-max verbal cues for scanning. Tactile cues for use of automatic soap dispenser.             Lower Body Dressing: Min guard;Sit to/from stand Lower Body Dressing Details (indicate cue type and reason): Donning socks sitting EOB. Pt observed to place hospital non-skid socks over personal socks from home. Toilet Transfer: Min guard;Ambulation Toilet Transfer Details (indicate cue type and reason): simulated in room         Functional mobility during ADLs: Min guard;Minimal assistance General ADL Comments: one minor LOB during  functional mobility, requiring min A to recover.     Vision Baseline Vision/History: 1 Wears glasses Patient Visual Report: Other (comment) (Field cut at baseline.) Vision Assessment?: Vision impaired- to be further tested in functional  context Additional Comments: Continue to assess vision. Pt with visual field cut at baseline and with decreased use of visual compensatory strategies during ADL at this time.     Perception     Praxis Praxis Praxis-Other Comments: Noting mild motor incoordination during session.    Pertinent Vitals/Pain Pain Assessment Pain Assessment: Faces Faces Pain Scale: No hurt     Hand Dominance Right   Extremity/Trunk Assessment Upper Extremity Assessment Upper Extremity Assessment: LUE deficits/detail LUE Deficits / Details: LUE 4+/5 strength. Pt with skill to perform finger opposition with increased time. Required additional verbal cues due to difficulty visualizing therapist demonstration. (RUE 5/5)   Lower Extremity Assessment Lower Extremity Assessment: Defer to PT evaluation   Cervical / Trunk Assessment Cervical / Trunk Assessment: Normal   Communication Communication Communication: Expressive difficulties;Receptive difficulties   Cognition Arousal/Alertness: Awake/alert Behavior During Therapy: WFL for tasks assessed/performed Overall Cognitive Status: Impaired/Different from baseline Area of Impairment: Attention, Memory, Following commands, Safety/judgement, Awareness, Problem solving                   Current Attention Level: Sustained Memory: Decreased short-term memory Following Commands: Follows one step commands with increased time, Follows one step commands consistently, Follows multi-step commands inconsistently Safety/Judgement: Decreased awareness of safety, Decreased awareness of deficits Awareness: Intellectual Problem Solving: Requires verbal cues, Requires tactile cues, Difficulty sequencing General Comments: Pt wife reporting she feels he is near his baseline prior to this admission, but still seems a little off. Pt requiring up to mod verbal cues for problem solving throughout session during washing hands. Pt with greater success with routine task and  when less visual scanning is needed for task (mod cues for hand washing to locate items, but min cueing after being handed socks). Pt placing hospital socks over personal socks this session. Cognition overall difficulty to assess due to expressive difficulties, but pt following one step commands throughout session with increased time.     General Comments  Wife present. Reporting she is concerned about managing seizure medication at home and would like more education about seizure recognition and response.    Exercises     Shoulder Instructions      Home Living Family/patient expects to be discharged to:: Private residence Living Arrangements: Spouse/significant other Available Help at Discharge: Family;Available 24 hours/day Type of Home: House Home Access: Level entry     Home Layout: Able to live on main level with bedroom/bathroom     Bathroom Shower/Tub: Chief Strategy Officer: Standard     Home Equipment: Agricultural consultant (2 wheels);Cane - single point;BSC/3in1   Additional Comments: Pt attempting to share home information, however, wife present and answering questions.      Prior Functioning/Environment Prior Level of Function : Needs assist             Mobility Comments: Pt wife reporting he has AD, but has not been using ADLs Comments: Pt and wife reporting that he was bathing and dressing independently and recently began mowing again. Pt wife reports that she drives him to appts/etc, and helps with medication management. Pt wife reports she frequently helps with ADL more than he actually needs (might cut his food for him even if he does not phyicially/cognitively need assistance)  OT Problem List: Decreased strength;Decreased activity tolerance;Impaired balance (sitting and/or standing);Impaired vision/perception;Decreased coordination;Decreased cognition;Decreased safety awareness      OT Treatment/Interventions: Self-care/ADL  training;Therapeutic exercise;DME and/or AE instruction;Manual therapy;Therapeutic activities;Cognitive remediation/compensation;Visual/perceptual remediation/compensation;Patient/family education;Balance training    OT Goals(Current goals can be found in the care plan section) Acute Rehab OT Goals Patient Stated Goal: Per wife, "for him to be back to being [name] again" OT Goal Formulation: With patient/family Time For Goal Achievement: 12/14/21 Potential to Achieve Goals: Good  OT Frequency: Min 2X/week    Co-evaluation              AM-PAC OT "6 Clicks" Daily Activity     Outcome Measure Help from another person eating meals?: A Little Help from another person taking care of personal grooming?: A Lot (unfamiliar environment) Help from another person toileting, which includes using toliet, bedpan, or urinal?: A Little Help from another person bathing (including washing, rinsing, drying)?: A Little Help from another person to put on and taking off regular upper body clothing?: A Little Help from another person to put on and taking off regular lower body clothing?: A Little 6 Click Score: 17   End of Session Equipment Utilized During Treatment: Gait belt Nurse Communication: Mobility status  Activity Tolerance: Patient tolerated treatment well Patient left: in chair;with call bell/phone within reach;with family/visitor present  OT Visit Diagnosis: Unsteadiness on feet (R26.81);Muscle weakness (generalized) (M62.81);Low vision, both eyes (H54.2);Other symptoms and signs involving cognitive function;Cognitive communication deficit (R41.841) Symptoms and signs involving cognitive functions:  (Seizure)                Time: 2706-2376 OT Time Calculation (min): 24 min Charges:  OT General Charges $OT Visit: 1 Visit OT Evaluation $OT Eval Moderate Complexity: 1 Mod OT Treatments $Self Care/Home Management : 8-22 mins  Ladene Artist, OTR/L [facility] Acute Rehabilitation Office:  [phone]   Drue Novel 11/30/2021, 11:46 AM

## 2021-11-30 NOTE — TOC Transition Note (Signed)
Transition of Care [facility]) - CM/SW Discharge Note   Patient Details  Name: Jesse [name] MRN: [id number] Date of Birth: [birth year]  [facility] of [facility] CM/SW Contact:  Kermit Balo, RN Phone Number: 11/30/2021, 12:17 PM   Clinical Narrative:    Pt is from home with spouse and 3 children. Pt has 24 hour supervision. Pts spouse provides needed transportation. She also manages his home medications and denies any issues.  DME at home: walker/ shower seat/ 3 in 1/ cane Recommendations for outpatient therapy. Pt attended [facility] last year and would like to attend there again. Orders in epic and information on the AVS.  Wife to provide transport home today.   Final next level of care: OP Rehab Barriers to Discharge: No Barriers Identified   Patient Goals and CMS Choice     Choice offered to / list presented to : Patient, Spouse  Discharge Placement                       Discharge Plan and Services                                     Social Determinants of Health (SDOH) Interventions     Readmission Risk Interventions     No data to display

## 2021-11-30 NOTE — Plan of Care (Signed)

## 2021-11-30 NOTE — Discharge Summary (Signed)
Physician Discharge Summary  Jesse [name] WGN:[id number] DOB: 11-20-1975 DOA: 11/27/2021  PCP: Georganna Skeans, MD  Admit date: 11/27/2021 Discharge date: 11/30/2021  Admitted From: Home Disposition: Home with outpatient therapies  Recommendations for Outpatient Follow-up:  Follow up with PCP in 1-2 weeks Please obtain ammonia levels in 1 week. Outpatient follow-up with neurology, referral sent.  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  Patient had episode of unresponsiveness with abnormal eye movement and posturing of his left arm, called EMS.  He was noted to have left-sided gaze and right-sided weakness.  Code stroke was called.  CT head and MRI were negative, however had episode of witnessed seizure and MRI aborted with Ativan.  Admitted with neurology work-up. Patient was noted to have a status epilepticus on continuous EEG.  Aborted with Keppra, Ativan and Depakote.     New onset seizure in a patient with stroke: Currently neurologically stabilized.  Remained postictal after admission to the hospital. Underwent long-term EEG monitoring, was noted to be having status epilepticus from right MCA territory corresponding with previous stroke.  Seizure aborted with Ativan, Keppra and Depakote. Responded well to Keppra and Depakote. As per neurology recommendation, discharging on Keppra and Depakote.  Ammonia level was normal.  Will need repeat ammonia level in 1 week. Outpatient referral sent to [facility] neurology for follow-up. Seizure precautions at home.  Fall  precautions. MRI and CT head with no acute findings. Will benefit with outpatient PT OT and speech therapy.  Will send referral. He does not drive.  Driving restrictions discussed with patient and family. Did not have evidence of alcohol withdrawal, with his multiple nervous system issues, recommended strictly against any alcohol use.   Acute kidney injury: Improved.   History of stroke with residual aphasia: Remains on aspirin, Coreg and statin.  Continued.  No new deficits.  PT OT and a speech to continue as outpatient.  Essential hypertension: Blood pressure stable on carvedilol, amlodipine.  Depression/anxiety: On Seroquel.  Medically stable for discharge.  Will benefit with ongoing outpatient rehab.   Discharge Diagnoses:  Principal Problem:   Seizure [facility])    Discharge Instructions  Discharge Instructions     Diet - low sodium heart healthy   Complete by: As directed    Discharge instructions   Complete by: As directed    Do not drink any alcohol   Increase activity slowly   Complete by: As directed       Allergies as of 11/30/2021   No Known Allergies      Medication List     STOP taking these medications    Zinc Sulfate 220 (50 Zn) MG Tabs       TAKE these medications    acetaminophen 325 MG tablet Commonly known as: TYLENOL Take 1-2 tablets (325-650 mg total) by mouth every 4 (four) hours as needed for mild pain.   amLODipine 5 MG tablet Commonly known as: NORVASC Take 0.5 tablets (2.5 mg total) by mouth in the morning and at bedtime.   aspirin 81 MG chewable tablet Chew 1 tablet (81 mg total) by mouth daily.   carvedilol 6.25 MG tablet Commonly  known as: COREG Take 1 tablet (6.25 mg total) by mouth 2 (two) times daily with a meal.   divalproex 500 MG DR tablet Commonly known as: Depakote Take 1 tablet (500 mg total) by mouth 2 (two) times daily.   ferrous sulfate 325 (65 FE) MG tablet Take 1 tablet (325 mg total) by mouth daily.    gabapentin 300 MG capsule Commonly known as: NEURONTIN Take 1 capsule (300 mg total) by mouth 2 (two) times daily AND 2 capsules (600 mg total) at bedtime.   levETIRAcetam 1000 MG tablet Commonly known as: KEPPRA Take 1 tablet (1,000 mg total) by mouth every 12 (twelve) hours.   LORazepam 0.5 MG tablet Commonly known as: ATIVAN Take 0.5 mg by mouth at bedtime as needed for sedation.   losartan 50 MG tablet Commonly known as: COZAAR Take 1 tablet (50 mg total) by mouth daily. What changed: how much to take   multivitamin with minerals Tabs tablet Take 1 tablet by mouth daily. Start taking on: December 01, 2021   oxyCODONE-acetaminophen 10-325 MG tablet Commonly known as: PERCOCET Take 0.5-1 tablets by mouth every 4 (four) hours as needed for pain.   pantoprazole sodium 40 mg Commonly known as: PROTONIX Dissolve 1 packet in 20 mLs (40 mg total) and take by mouth daily. What changed:  when to take this reasons to take this   polyethylene glycol 17 g packet Commonly known as: MIRALAX / GLYCOLAX Take 17 g by mouth daily as needed for moderate constipation.   QUEtiapine 50 MG tablet Commonly known as: SEROQUEL TAKE 1 TABLET BY MOUTH AT BEDTIME. AND 1/2 TABLET DURING THE DAY AS NEEDED FOR AGITATION What changed: See the new instructions.   rosuvastatin 20 MG tablet Commonly known as: Crestor Take 1 tablet (20 mg total) by mouth daily.   tamsulosin 0.4 MG Caps capsule Commonly known as: FLOMAX Take 2 capsules (0.8 mg total) by mouth daily after supper.        Follow-up Information     Georganna Skeans, MD Follow up in 1 week(s).   Specialty: Family Medicine Why: please check ammonia level as he is on depakote Contact information: [address] [employer] [address] [name] [address] [phone]                No Known Allergies  Consultations: Neurology   Procedures/Studies: MR BRAIN WO CONTRAST  Result Date: 11/27/2021 CLINICAL DATA:  Neuro deficit,  acute, stroke suspected. Mental status change, unknown cause. Aphasia and right-sided weakness. History of aortic dissection repair. EXAM: MRI HEAD WITHOUT CONTRAST TECHNIQUE: Multiplanar, multiecho pulse sequences of the brain and surrounding structures were obtained without intravenous contrast. COMPARISON:  Head CT 11/27/2021 and MRI 11/07/2020 FINDINGS: The examination had to be discontinued prior to completion due to the patient actively seizing. Axial and coronal diffusion, sagittal T1, axial T2 FLAIR, and axial susceptibility weighted sequences were obtained. There is no evidence of an acute infarct, mass, midline shift, hydrocephalus, or extra-axial fluid collection. A moderately large chronic posterior right MCA infarct is again noted with associated hemosiderin deposition. There are also small chronic infarcts in the centrum semiovale/corona radiata bilaterally, left basal ganglia, and both cerebellar hemispheres. IMPRESSION: 1. Incomplete examination. No acute infarct. 2. Chronic infarcts as above. Electronically Signed   By: Sebastian Ache M.D.   On: 11/27/2021 21:11   CT ANGIO HEAD NECK W WO CM (CODE STROKE)  Result Date: 11/27/2021 CLINICAL DATA:  Neuro deficit, acute, stroke suspected. Aphasia and right-sided weakness. History  of aortic dissection. EXAM: CT ANGIOGRAPHY HEAD AND NECK TECHNIQUE: Multidetector CT imaging of the head and neck was performed using the standard protocol during bolus administration of intravenous contrast. Multiplanar CT image reconstructions and MIPs were obtained to evaluate the vascular anatomy. Carotid stenosis measurements (when applicable) are obtained utilizing NASCET criteria, using the distal internal carotid diameter as the denominator. RADIATION DOSE REDUCTION: This exam was performed according to the departmental dose-optimization program which includes automated exposure control, adjustment of the mA and/or kV according to patient size and/or use of iterative  reconstruction technique. CONTRAST:  78mL OMNIPAQUE IOHEXOL 350 MG/ML SOLN COMPARISON:  Head and neck MRA 11/07/2020. Head and neck CTA 11/04/2020. FINDINGS: CTA NECK FINDINGS Aortic arch: More fully evaluated on the separate CTA of the chest, abdomen, and pelvis. The included portion of the aorta to right subclavian and right common carotid bypass graft is widely patent. Right carotid system: Patent without evidence of stenosis or dissection. Left carotid system: Patent with minimal calcified plaque in the proximal ICA. No evidence of a significant stenosis or dissection. Vertebral arteries: Patent without evidence of stenosis or dissection. Strongly dominant right vertebral artery. Skeleton: Mild disc degeneration in the cervical spine. Focally advanced left facet arthrosis at C3-4. Other neck: No evidence of cervical lymphadenopathy or mass. Upper chest: Reported separately. Review of the MIP images confirms the above findings CTA HEAD FINDINGS Anterior circulation: The internal carotid arteries are widely patent from skull base to carotid termini. ACAs and MCAs are patent without evidence of a proximal branch occlusion or significant proximal stenosis. There is a decreased number of distal right MCA branch vessels in the region of the chronic infarct. No aneurysm is identified. Posterior circulation: The intracranial vertebral arteries are widely patent to the basilar. Patent PICA and SCA origins are seen bilaterally. The basilar artery is widely patent. There is a large left posterior communicating artery with hypoplastic left P1 segment. Both PCAs are patent without evidence of a significant proximal stenosis. No aneurysm is identified. Venous sinuses: Patent. Anatomic variants: Predominantly fetal origin of the left PCA. Review of the MIP images confirms the above findings IMPRESSION: No large vessel occlusion or significant proximal stenosis in the head or neck. These results were called by telephone at the  time of interpretation on 11/27/2021 at 8:50 pm to Dr. Erick Blinks, who verbally acknowledged these results. Electronically Signed   By: Sebastian Ache M.D.   On: 11/27/2021 20:59   CT Angio Chest/Abd/Pel for Dissection W and/or Wo Contrast  Result Date: 11/27/2021 CLINICAL DATA:  Neurologic deficit, history of aortic dissection status post repair EXAM: CT ANGIOGRAPHY CHEST, ABDOMEN AND PELVIS TECHNIQUE: Non-contrast CT of the chest was initially obtained. Multidetector CT imaging through the chest, abdomen and pelvis was performed using the standard protocol during bolus administration of intravenous contrast. Multiplanar reconstructed images and MIPs were obtained and reviewed to evaluate the vascular anatomy. RADIATION DOSE REDUCTION: This exam was performed according to the departmental dose-optimization program which includes automated exposure control, adjustment of the mA and/or kV according to patient size and/or use of iterative reconstruction technique. CONTRAST:  78mL OMNIPAQUE IOHEXOL 350 MG/ML SOLN COMPARISON:  08/12/2021 FINDINGS: CTA CHEST FINDINGS Cardiovascular: Stable postsurgical changes from prior type A aortic dissection repair, with interposition graft involving the ascending thoracic aorta, innominate artery, and origin of the right subclavian and right common carotid arteries as before. No evidence of recurrent dissection or aneurysm. The heart is unremarkable without pericardial effusion. Lung not optimized for opacification  of the pulmonary vasculature, there is sufficient contrast enhancement to exclude central or segmental pulmonary emboli. No filling defects. Mediastinum/Nodes: No enlarged mediastinal, hilar, or axillary lymph nodes. Thyroid gland, trachea, and esophagus demonstrate no significant findings. Lungs/Pleura: No acute airspace disease, effusion, or pneumothorax. Central airways are patent. Musculoskeletal: No acute or destructive bony lesions. Reconstructed images  demonstrate no additional findings. Review of the MIP images confirms the above findings. CTA ABDOMEN AND PELVIS FINDINGS VASCULAR Aorta: Normal caliber aorta without aneurysm, dissection, vasculitis or significant stenosis. Mild diffuse atherosclerosis. Celiac: Patent without evidence of aneurysm, dissection, vasculitis or significant stenosis. SMA: Patent without evidence of aneurysm, dissection, vasculitis or significant stenosis. Renals: There are bilateral accessory renal arteries in addition to the bilateral main renal arteries. Arteries are patent without evidence of aneurysm, dissection, vasculitis, fibromuscular dysplasia or significant stenosis. IMA: Patent without evidence of aneurysm, dissection, vasculitis or significant stenosis. Inflow: Patent without evidence of aneurysm, dissection, vasculitis or significant stenosis. Mild atherosclerosis. Veins: No obvious venous abnormality within the limitations of this arterial phase study. Review of the MIP images confirms the above findings. NON-VASCULAR Hepatobiliary: No focal liver abnormality is seen. No gallstones, gallbladder wall thickening, or biliary dilatation. Pancreas: Unremarkable. No pancreatic ductal dilatation or surrounding inflammatory changes. Spleen: Normal in size without focal abnormality. Adrenals/Urinary Tract: Adrenal glands are unremarkable. Kidneys are normal, without renal calculi, focal lesion, or hydronephrosis. Bladder is unremarkable. Stomach/Bowel: No bowel obstruction or ileus. Normal appendix right lower quadrant. No bowel wall thickening or inflammatory change. Lymphatic: No pathologic adenopathy. Reproductive: Prostate is unremarkable. Other: No free fluid or free intraperitoneal gas. No abdominal wall hernia. Musculoskeletal: No acute or destructive bony lesions. Reconstructed images demonstrate no additional findings. Review of the MIP images confirms the above findings. IMPRESSION: 1. Stable postoperative changes from  type A thoracic aortic dissection repair. No evidence of recurrent dissection or aortic aneurysm. 2. No evidence of pulmonary embolus. 3. No acute intrathoracic, intra-abdominal, or intrapelvic process. 4.  Aortic Atherosclerosis (ICD10-I70.0). Electronically Signed   By: Sharlet SalinaMichael  Brown M.D.   On: 11/27/2021 20:54   CT HEAD CODE STROKE WO CONTRAST  Result Date: 11/27/2021 CLINICAL DATA:  Code stroke. Neuro deficit, acute, stroke suspected. EXAM: CT HEAD WITHOUT CONTRAST TECHNIQUE: Contiguous axial images were obtained from the base of the skull through the vertex without intravenous contrast. RADIATION DOSE REDUCTION: This exam was performed according to the departmental dose-optimization program which includes automated exposure control, adjustment of the mA and/or kV according to patient size and/or use of iterative reconstruction technique. COMPARISON:  Head CT 11/18/2020 and MRI 11/07/2020 FINDINGS: Brain: A moderately large region of encephalomalacia is now present in the posterior right MCA territory reflecting evolution of the infarct which was acute last year. Small chronic cerebellar infarcts also correspond to acute infarcts last year. There is an unchanged chronic lacunar infarct at the posterior aspect of the left lentiform nucleus. No definite acute infarct, intracranial hemorrhage, mass, midline shift, hydrocephalus, or extra-axial fluid collection is identified. Vascular: No hyperdense vessel. Skull: No fracture or suspicious osseous lesion. Sinuses/Orbits: Visualized paranasal sinuses and mastoid air cells are clear. Unremarkable orbits. Other: None. IMPRESSION: 1. No evidence of acute intracranial abnormality. 2. Chronic infarcts as above. These results were communicated to Dr. Derry LoryKhaliqdina at 8:29 pm on 11/27/2021 by text page via the [facility] messaging system. Electronically Signed   By: Sebastian AcheAllen  Grady M.D.   On: 11/27/2021 20:30   (Echo, Carotid, EGD, Colonoscopy, ERCP)    Subjective: Patient seen  and examined.  No overnight events.  No more episodes seizure.  Wife at the bedside. She feels like his mentation is back to his usual self, however nowadays he is more smiling and then being grumpy like he was before.  This is probably related to his confusion from the events ongoing for the last few days.  Patient is alert and mostly oriented.   Discharge Exam: Vitals:   11/30/21 0758 11/30/21 0806  BP: (!) 181/93 (!) 152/80  Pulse: (!) 51   Resp: 17   Temp: 99 F (37.2 C)   SpO2: 100%    Vitals:   11/29/21 2346 11/30/21 0356 11/30/21 0758 11/30/21 0806  BP: (!) 160/84 (!) 148/80 (!) 181/93 (!) 152/80  Pulse: 60 (!) 54 (!) 51   Resp: 17 15 17    Temp: 98.8 F (37.1 C) 98 F (36.7 C) 99 F (37.2 C)   TempSrc: Oral Oral Oral   SpO2: 100% 100% 100%   Weight:      Height:        General: Pt is alert, awake, not in acute distress He is mostly oriented x 2-3.  Cannot recall the event. Cardiovascular: RRR, S1/S2 +, no rubs, no gallops Respiratory: CTA bilaterally, no wheezing, no rhonchi Abdominal: Soft, NT, ND, bowel sounds + Extremities: no edema, no cyanosis    The results of significant diagnostics from this hospitalization (including imaging, microbiology, ancillary and laboratory) are listed below for reference.     Microbiology: Recent Results (from the past 240 hour(s))  Remove urinary catheter to obtain Straight Cath urine culture     Status: None   Collection Time: 11/27/21  9:21 PM   Specimen: In/Out Cath Urine  Result Value Ref Range Status   Specimen Description IN/OUT CATH URINE  Final   Special Requests NONE  Final   Culture   Final    NO GROWTH Performed at [facility], [address] [place], [place]    Report Status 11/29/2021 FINAL  Final     Labs: BNP (last 3 results) No results for input(s): "BNP" in the last 8760 hours. Basic Metabolic Panel: Recent Labs  Lab 11/27/21 2021 11/27/21 2022 11/28/21 0249  NA 137 139 137   K 4.5 4.5 4.0  CL 104 104 102  CO2 18*  --  25  GLUCOSE 141* 135* 112*  BUN 13 13 10   CREATININE 1.45* 1.10 1.16  CALCIUM 9.1  --  8.9   Liver Function Tests: Recent Labs  Lab 11/27/21 2021 11/28/21 0249  AST 30 26  ALT 16 18  ALKPHOS 62 59  BILITOT 0.8 0.7  PROT 6.9 6.6  ALBUMIN 4.0 3.7   No results for input(s): "LIPASE", "AMYLASE" in the last 168 hours. Recent Labs  Lab 11/28/21 0249  AMMONIA 40*   CBC: Recent Labs  Lab 11/27/21 2021 11/27/21 2022 11/28/21 0249  WBC 10.8*  --  8.5  NEUTROABS 7.8*  --   --   HGB 16.1 17.0 14.9  HCT 49.0 50.0 41.9  MCV 98.2  --  93.1  PLT 164  --  150   Cardiac Enzymes: No results for input(s): "CKTOTAL", "CKMB", "CKMBINDEX", "TROPONINI" in the last 168 hours. BNP: Invalid input(s): "POCBNP" CBG: Recent Labs  Lab 11/29/21 1128 11/29/21 1539 11/29/21 2146 11/30/21 0625 11/30/21 0819  GLUCAP 119* 85 98 101* 117*   D-Dimer No results for input(s): "DDIMER" in the last 72 hours. Hgb A1c No results for input(s): "HGBA1C" in the last 72  hours. Lipid Profile No results for input(s): "CHOL", "HDL", "LDLCALC", "TRIG", "CHOLHDL", "LDLDIRECT" in the last 72 hours. Thyroid function studies Recent Labs    11/28/21 0249  TSH 1.206   Anemia work up Recent Labs    11/28/21 0249  VITAMINB12 450  FOLATE 10.4   Urinalysis    Component Value Date/Time   COLORURINE YELLOW 11/27/2021 2144   APPEARANCEUR CLEAR 11/27/2021 2144   LABSPEC 1.016 11/27/2021 2144   PHURINE 5.0 11/27/2021 2144   GLUCOSEU NEGATIVE 11/27/2021 2144   HGBUR LARGE (A) 11/27/2021 2144   BILIRUBINUR NEGATIVE 11/27/2021 2144   KETONESUR NEGATIVE 11/27/2021 2144   PROTEINUR 30 (A) 11/27/2021 2144   UROBILINOGEN 1.0 12/23/2020 1501   NITRITE NEGATIVE 11/27/2021 2144   LEUKOCYTESUR NEGATIVE 11/27/2021 2144   Sepsis Labs Recent Labs  Lab 11/27/21 2021 11/28/21 0249  WBC 10.8* 8.5   Microbiology Recent Results (from the past 240 hour(s))   Remove urinary catheter to obtain Straight Cath urine culture     Status: None   Collection Time: 11/27/21  9:21 PM   Specimen: In/Out Cath Urine  Result Value Ref Range Status   Specimen Description IN/OUT CATH URINE  Final   Special Requests NONE  Final   Culture   Final    NO GROWTH Performed at [facility], [address], [place], [place] [address]    Report Status 11/29/2021 FINAL  Final     Time coordinating discharge: 35 minutes  SIGNED:   Dorcas Carrow, MD  [facility] 11/30/2021, 11:14 AM

## 2021-11-30 NOTE — TOC CAGE-AID Note (Signed)
Transition of Care [facility]) - CAGE-AID Screening   Patient Details  Name: Jesse [name] MRN: [id number] Date of Birth: 05/08/1975  [facility] CM/SW Contact:    Kermit Balo, RN Phone Number: 11/30/2021, 12:14 PM   Clinical Narrative: Per spouse pt only drinks a couple of beers occasionally. They deny the need for inpatient/ outpatient alcohol counseling.   CAGE-AID Screening:    Have You Ever Felt You Ought to Cut Down on Your Drinking or Drug Use?: No Have People Annoyed You By Critizing Your Drinking Or Drug Use?: No Have You Felt Bad Or Guilty About Your Drinking Or Drug Use?: No Have You Ever Had a Drink or Used Drugs First Thing In The Morning to Steady Your Nerves or to Get Rid of a Hangover?: No CAGE-AID Score: 0  Substance Abuse Education Offered: Yes (refused)

## 2021-12-01 ENCOUNTER — Telehealth: Payer: Self-pay

## 2021-12-01 NOTE — Telephone Encounter (Signed)
Transition Care Management Follow-up Telephone Call  Call completed with patient's wife, [name] Date of discharge and from where: 11/30/2021, [facility] How have you been since you were released from the hospital? [name] said he is still confused. She feels that he is back at square one, where he was after his stroke.  Any questions or concerns? Yes- she is concerned his inappropriate laughing.  The laughing is not constant but she is concerned because it is occurring more frequently. She is also interested in speaking with a nutritionist   Items Reviewed: Did the pt receive and understand the discharge instructions provided? Yes  Medications obtained and verified? Yes - [name] said she has all of his medications and she did not have any questions about the med regime at this time  Other? No  Any new allergies since your discharge? No  Dietary orders reviewed? Vickii Penna would like to speak with a nutritionist.  Do you have support at home? Yes   Home Care and Equipment/Supplies: Were home health services ordered? no If so, what is the name of the agency? N/a  Has the agency set up a time to come to the patient's home? not applicable Were any new equipment or medical supplies ordered?  No What is the name of the medical supply agency? N/a Were you able to get the supplies/equipment? not applicable Do you have any questions related to the use of the equipment or supplies? No  Functional Questionnaire: (I = Independent and D = Dependent) ADLs: needs assistance.  His wife is providing 24/7 supervision and hands on assistance. He is dependent on her for medication management. He has a walker but will not always use it    Follow up appointments reviewed:  PCP Hospital f/u appt confirmed? Yes  Scheduled to see Dr Andrey Campanile- 12/30/2021.   His wife requested that the appointment be scheduled after her children return to school.  Specialist Hospital f/u appt confirmed?  Cardiology - 01/07/2022. He is  also scheduled to resume neuro outpatient OT and ST . Are transportation arrangements needed? No  If their condition worsens, is the pt aware to call PCP or go to the Emergency Dept.? Yes Was the patient provided with contact information for the PCP's office or ED? Yes Was to pt encouraged to call back with questions or concerns? Yes

## 2021-12-08 ENCOUNTER — Other Ambulatory Visit: Payer: Self-pay | Admitting: Family Medicine

## 2021-12-08 ENCOUNTER — Other Ambulatory Visit: Payer: Self-pay | Admitting: Physical Medicine & Rehabilitation

## 2021-12-08 ENCOUNTER — Other Ambulatory Visit (HOSPITAL_BASED_OUTPATIENT_CLINIC_OR_DEPARTMENT_OTHER): Payer: Self-pay | Admitting: Internal Medicine

## 2021-12-08 MED ORDER — LOSARTAN POTASSIUM 50 MG PO TABS: 50.0000 mg | ORAL_TABLET | Freq: Every day | ORAL | 3 refills | Status: DC

## 2021-12-08 NOTE — Telephone Encounter (Signed)
Next appt 12/30/2021 10:40 AM (Arrive by 10:25 AM)     Refill requestedd

## 2021-12-09 ENCOUNTER — Encounter: Payer: Self-pay | Admitting: Speech Pathology

## 2021-12-10 ENCOUNTER — Telehealth: Payer: Self-pay | Admitting: Occupational Therapy

## 2021-12-10 ENCOUNTER — Encounter: Payer: Self-pay | Admitting: Occupational Therapy

## 2021-12-10 ENCOUNTER — Ambulatory Visit: Payer: 59 | Attending: Internal Medicine | Admitting: Occupational Therapy

## 2021-12-10 ENCOUNTER — Telehealth: Payer: Self-pay | Admitting: Adult Health

## 2021-12-10 DIAGNOSIS — R278 Other lack of coordination: Secondary | ICD-10-CM | POA: Diagnosis not present

## 2021-12-10 DIAGNOSIS — M6281 Muscle weakness (generalized): Secondary | ICD-10-CM | POA: Diagnosis not present

## 2021-12-10 DIAGNOSIS — R41842 Visuospatial deficit: Secondary | ICD-10-CM | POA: Diagnosis not present

## 2021-12-10 DIAGNOSIS — R2681 Unsteadiness on feet: Secondary | ICD-10-CM | POA: Diagnosis not present

## 2021-12-10 DIAGNOSIS — R41840 Attention and concentration deficit: Secondary | ICD-10-CM | POA: Insufficient documentation

## 2021-12-10 DIAGNOSIS — R208 Other disturbances of skin sensation: Secondary | ICD-10-CM | POA: Insufficient documentation

## 2021-12-10 NOTE — Telephone Encounter (Signed)
Dr. Pearlean Brownie,  I evaluated this pt today for OT.  It looks like outpatient physical therapy was recommended when this pt was evaluated in the hospital.  Also, wife reports balance/mobility changes s/p recent seizure.  If you agree, could you please place an order for outpatient physical therapy at [facility] via Epic?  Thank you,   Willa Frater, OTR/L [facility] [address] [place], [place]  [address] [phone] phone [phone] 12/10/21 1:10 PM

## 2021-12-10 NOTE — Addendum Note (Signed)
Addended by: Willa Frater D on: 12/10/2021 03:21 PM   Modules accepted: Orders

## 2021-12-10 NOTE — Telephone Encounter (Signed)
Are you willing to provide?

## 2021-12-10 NOTE — Telephone Encounter (Signed)
I have contacted pt wife 3X no answer, LVM. Needing to know the date of her Jesse [name] duty to provide letter excusing her.

## 2021-12-10 NOTE — Telephone Encounter (Signed)
Yes that is fine.  Thank you.

## 2021-12-10 NOTE — Telephone Encounter (Signed)
Patients wife(in DPR) came in to ask if she can get a letter advising she is her husband's caregiver. She received a letter for jury duty and she did complete the information explaining her husband is ill and she takes care of hi. The court is asking for a letter from Shanda Bumps stating she is the caregiver so she can be excused. Currently he is in rehab weekly due to seizure/stroke so she is asking for thel etter. Thank you

## 2021-12-10 NOTE — Therapy (Addendum)
OUTPATIENT OCCUPATIONAL THERAPY NEURO EVALUATION  Patient Name: Jesse [name] MRN: [id number] DOB:Sep 03, 1975, 46 y.o., male Today's Date: 12/10/2021  PCP: Georganna Skeans, MD REFERRING PROVIDER: Dorcas Carrow, MD    OT End of Session - 12/10/21 1259     Visit Number 1    Number of Visits 13    Date for OT Re-Evaluation 03/10/22    Authorization Type [employer]:  35 visit limt--? if combined limit    OT Start Time 0933    OT Stop Time 1015    OT Time Calculation (min) 42 min    Activity Tolerance Patient tolerated treatment well    Behavior During Therapy [facility] for tasks assessed/performed             Past Medical History:  Diagnosis Date   Anxiety    Depression    Essential hypertension    Myocardial infarction (HCC)    S/P aortic dissection repair 11/05/2020   Straight graft replacement of ascending aorta and proximal transverse aortic arch with re-suspension of native aortic valve and open hemi arch distal anastomosis with aorta to right common carotid bypass and aorta to right subclavian bypass   Stroke [facility])    Past Surgical History:  Procedure Laterality Date   ASCENDING AORTIC ROOT REPLACEMENT N/A 11/04/2020   Procedure: 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 WITH REPLACEMENT OF ASCENDING AORTA AND HEMIARCH USING HEMASHIELD PLATINUM GRAFT AND HEMASHIELD GOLD 14 X GRAFT, RESUSPENSION OF NATIVE VALVE, AORTA TO RIGHT CAROTID BYPASS, AORTA TO RIGHT SUBCLAVIAN BYPASS;  Surgeon: Purcell Nails, MD;  Location: [facility] OR;  Service: Open Heart Surgery;  Laterality: N/A;   TEE WITHOUT CARDIOVERSION N/A 11/04/2020   Procedure: TRANSESOPHAGEAL ECHOCARDIOGRAM (TEE);  Surgeon: Purcell Nails, MD;  Location: [facility] OR;  Service: Open Heart Surgery;  Laterality: N/A;   Patient Active Problem List   Diagnosis Date Noted   Seizure (HCC) 11/27/2021   Altered sensation due to recent stroke 02/19/2021   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   AKI (acute kidney injury) (HCC)    Benign  essential HTN    Hyponatremia    Acute blood loss anemia    Urinary retention    Pressure injury of skin 11/17/2020   Respiratory arrest (HCC) 11/14/2020   Acute respiratory failure with hypoxia (HCC) 11/14/2020   Adverse effects of medication 11/14/2020   Hypotension 11/14/2020   Encounter for central line placement    Aortic dissection (HCC) 11/13/2020   Acute ischemic right MCA stroke (HCC) 11/13/2020   Agitation 11/13/2020   Atelectasis    Phlebitis    Polysubstance abuse (HCC)    Elevated troponin 11/07/2020   New onset left bundle branch block (LBBB) 11/07/2020   Protein-calorie malnutrition, severe 11/07/2020   Encephalopathy acute    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)    Acute hypoxemic respiratory failure (HCC)    Acute thoracic aortic dissection (HCC) 11/05/2020   S/P aortic dissection repair 11/05/2020   Cerebral embolism with cerebral infarction 11/05/2020   Essential hypertension     ONSET DATE: 11/04/20 CVA, 11/27/21-11/30/21 hospitalization for seizure  REFERRING DIAG: R56.9 (ICD-10-CM) - Seizure (HCC) R47.01 (ICD-10-CM) - Aphasia   THERAPY DIAG:  Other lack of coordination  Muscle weakness (generalized)  Unsteadiness on feet  Visuospatial deficit  Attention and concentration deficit  Other disturbances of skin sensation  Rationale for Evaluation and Treatment Rehabilitation  SUBJECTIVE:   SUBJECTIVE STATEMENT: Pt/wife report that pt has had a little set-back since seizure and  that he also has "brain fog"  Pt accompanied by: self and significant other wife--Jena  PERTINENT HISTORY: 46 year old male with daily alcohol use, smoker, 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  He was noted to have left-sided  gaze and right-sided weakness. CT head and MRI were negative, however had episode of witnessed seizure. Patient was noted to have a status epilepticus on continuous EEG.    PRECAUTIONS: Fall and Other: seizure, no driving  WEIGHT BEARING RESTRICTIONS No  PAIN:  Are you having pain? Yes: NPRS scale: 8/10 Pain location: bilateral feet  Pain description: pins/needles  Aggravating factors: walking, at night Relieving factors: creams .  OT will not directly address due to location/nature.   FALLS: Has patient fallen in last 6 months? 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, and Leisure: working on cars, Presenter, broadcasting, hunting, fishing, watching sports, playing with kids, shooting basketball   PATIENT GOALS  get back to doing IADLs that he was performing prior to seizure  OBJECTIVE:   HAND DOMINANCE: Right  ADLs: Transfers/ambulation related to ADLs:  unsteady Eating: mod I, difficulty with cutting due to vision Grooming: mod I UB Dressing: mod I LB Dressing: mod I Toileting: mod I  Bathing: mod I Tub Shower transfers: mod I Equipment: Shower seat with back and Transfer tub bench, but standing in shower   IADLs: Shopping: carried groceries in prior to seizure Light housekeeping: mowed (riding and Firefighter) and carried out trash prior to seizure--not performing yardwork now, was also working on car prior to seizure (currently not performing) Meal Prep: wife has always done cooking, pt can perform snack prep, microwave use currently. Community mobility: pt has not driven since CVA Medication management: wife performs  Landscape architect: wife has always performed, pt assists Handwriting:  difficulty with name   MOBILITY STATUS:  mod I, but unsteady  ACTIVITY TOLERANCE: Activity tolerance: needs more  rest  FUNCTIONAL OUTCOME MEASURES: FOTO: n/a   UPPER EXTREMITY ROM:  BUEs WNL   UPPER EXTREMITY MMT:   BUE proximal strength grossly WNL  HAND FUNCTION: Grip strength: Right: 77.3 lbs; Left: 71.2 lbs  COORDINATION: 9 Hole Peg test: Right: 35.72 sec; Left: 34.62 sec  SENSATION: Pt reports decr sensation in R hand with neuropathy in R hand and bilateral feet  COGNITION: Overall cognitive status: Impaired.  Pt/wife report that although memory is improving, pt continues to experience/report brain fog per pt/wife.  VISION ASSESSMENT: Impaired To be further assessed in functional context no changes from seizure per pt report.  Pt just received new glasses and was evaluated by eye doctor.  However, since CVA pt reports continued R eye blurry decr functional vision ("almost legally blind") and L eye with decreased vision (describes central vision only).  Initially after seizure he was noted to have L-sided gaze in hospital.     TODAY'S TREATMENT:  Evaluation completed   PATIENT EDUCATION: Education details: OT Eval results/POC Person educated: Patient and Parent Education method: Explanation Education comprehension: verbalized understanding   HOME EXERCISE PROGRAM: Not yet issued    GOALS: Potential Goals reviewed with patient? Yes  SHORT TERM GOALS: Target date: 01/09/22  Pt will verbalize understanding of updated HEP for coordination. Baseline:  would benefit from updates s/p seizure Goal status: INITIAL  2.  Pt/wife will verbalize understanding of updated compensation strategies/safety precautions for ADLs/IADLs (including visual/cognitive strategies and/or strategies for decr balance). Baseline:   needs education for changes s/p seizure Goal status: INITIAL  3.  Pt will be able to carry groceries in house safely. Baseline:   not currently performing Goal status: INITIAL  4.  Pt will write name/simple phrase with good legibility. Baseline:  pt now reports  difficulty  Goal status: INITIAL   LONG TERM GOALS: Target date: 02/08/22  Pt/caregiver will verbalize understanding of updated HEP for vision and cognitive deficits. Baseline:  would benefit from updates s/p seizure Goal status: INITIAL  2.  Pt will return to previously yardwork safely. Baseline:  not currently performing, but was mowing and picking up limbs prior to seizure Goal status: INITIAL  3.  Pt will perform environmental scanning/navigation in mod distracting environment with at least 80% accuracy and no LOB for incr safety with yardwork and community activities. Baseline:  pt/wife report that pt can't see out of R eye ("almost legally blind") and limited vision with L eye  Goal status: INITIAL   ASSESSMENT:  CLINICAL IMPRESSION: Patient is a 46 y.o. male who was seen today for occupational therapy evaluation s/p seizure with hx of CVA.   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 CT head and MRI were negative, however had episode of witnessed seizure and was noted to have a status epilepticus on continuous EEG.  Wife reports that pt was doing well post CVA and was performing more IADLs, but has had a "set-back" after seizure.  Pt presents today with visual deficits, cognitive deficits, balance deficits, decr sensation, decr coordination, and decr general strength/activity tolerance.  Pt would benefit from occupational therapy to address these deficits for incr safety and independence for prior ADLs/IADLs.    PERFORMANCE DEFICITS in functional skills including ADLs, IADLs, coordination, dexterity, sensation, pain, FMC, GMC, mobility, balance, endurance, decreased knowledge of use of DME, vision, and UE functional use, cognitive skills including  memory, safety awareness, and thought  IMPAIRMENTS are limiting patient from ADLs, IADLs, and leisure.   COMORBIDITIES may have co-morbidities  that affects occupational performance. Patient will benefit from skilled OT to address above impairments and improve overall function.  MODIFICATION OR ASSISTANCE TO COMPLETE EVALUATION: Min-Moderate modification of tasks or assist with assess necessary to complete an evaluation.  OT OCCUPATIONAL PROFILE AND HISTORY: Detailed assessment: Review of records and additional review of physical, cognitive, psychosocial history related to current functional performance.  CLINICAL DECISION MAKING: Moderate - several treatment options, min-mod task modification necessary  REHAB POTENTIAL: Good  EVALUATION COMPLEXITY: Moderate    PLAN: OT FREQUENCY: 2x/week  OT DURATION: for 12 visits +eval over 12 weeks due to scheduling needs.  PLANNED INTERVENTIONS: self care/ADL training, therapeutic exercise, therapeutic activity, neuromuscular re-education, passive range of motion, balance training, functional mobility training, moist heat, cryotherapy, patient/family education, cognitive remediation/compensation, visual/perceptual remediation/compensation, energy conservation, and DME and/or AE instructions  RECOMMENDED OTHER SERVICES: pt has referral for ST evaluation.  Pt would also benefit from PT referral due to changes in balance since seizure per pt/wife and acute PT recommendation for outpatient PT.  CONSULTED AND AGREED  WITH PLAN OF CARE: Patient and family member/caregiver  PLAN FOR NEXT SESSION: HEP for coordination, environmental scanning, writing      Teodoro Jeffreys, OTR/L 12/10/2021, 1:01 PM

## 2021-12-11 ENCOUNTER — Other Ambulatory Visit: Payer: Self-pay | Admitting: Neurology

## 2021-12-11 ENCOUNTER — Encounter: Payer: Self-pay | Admitting: Adult Health

## 2021-12-11 DIAGNOSIS — Z5181 Encounter for therapeutic drug level monitoring: Secondary | ICD-10-CM

## 2021-12-11 DIAGNOSIS — R269 Unspecified abnormalities of gait and mobility: Secondary | ICD-10-CM

## 2021-12-11 DIAGNOSIS — I69398 Other sequelae of cerebral infarction: Secondary | ICD-10-CM

## 2021-12-14 ENCOUNTER — Encounter: Payer: 59 | Admitting: Occupational Therapy

## 2021-12-14 ENCOUNTER — Encounter: Payer: 59 | Admitting: Speech Pathology

## 2021-12-14 NOTE — Telephone Encounter (Signed)
Pt has called back to report that her date is Sept 11th, pt asking to be called and she will come for the letter.

## 2021-12-15 NOTE — Telephone Encounter (Signed)
I called patient. I spoke with patient's wife, [name], per DPR. I advised her that the jury duty excuse letter is available at the front desk for pick up. I recommended she review the letter prior to leaving the office and let us know if corrections are needed. Patient's wife verbalized understanding.

## 2021-12-16 ENCOUNTER — Encounter: Payer: 59 | Admitting: Occupational Therapy

## 2021-12-16 ENCOUNTER — Other Ambulatory Visit: Payer: Self-pay | Admitting: Family Medicine

## 2021-12-16 ENCOUNTER — Telehealth: Payer: Self-pay | Admitting: *Deleted

## 2021-12-16 NOTE — Telephone Encounter (Signed)
Results NA per CoverMyMeds.

## 2021-12-16 NOTE — Telephone Encounter (Signed)
Prior Auth for Quetiapine submitted to [facility] via CoverMyMeds.

## 2021-12-17 ENCOUNTER — Other Ambulatory Visit: Payer: Self-pay | Admitting: Adult Health

## 2021-12-17 ENCOUNTER — Other Ambulatory Visit (INDEPENDENT_AMBULATORY_CARE_PROVIDER_SITE_OTHER): Payer: Self-pay

## 2021-12-17 DIAGNOSIS — Z0289 Encounter for other administrative examinations: Secondary | ICD-10-CM

## 2021-12-17 DIAGNOSIS — R569 Unspecified convulsions: Secondary | ICD-10-CM | POA: Diagnosis not present

## 2021-12-17 DIAGNOSIS — I69398 Other sequelae of cerebral infarction: Secondary | ICD-10-CM | POA: Diagnosis not present

## 2021-12-17 DIAGNOSIS — Z5181 Encounter for therapeutic drug level monitoring: Secondary | ICD-10-CM | POA: Diagnosis not present

## 2021-12-18 LAB — CBC WITH DIFFERENTIAL/PLATELET
Basophils Absolute: 0.1 10*3/uL (ref 0.0–0.2)
Basos: 1 %
EOS (ABSOLUTE): 0.1 10*3/uL (ref 0.0–0.4)
Eos: 2 %
Hematocrit: 50 % (ref 37.5–51.0)
Hemoglobin: 17.7 g/dL (ref 13.0–17.7)
Immature Grans (Abs): 0 10*3/uL (ref 0.0–0.1)
Immature Granulocytes: 1 %
Lymphocytes Absolute: 1.8 10*3/uL (ref 0.7–3.1)
Lymphs: 29 %
MCH: 32.7 pg (ref 26.6–33.0)
MCHC: 35.4 g/dL (ref 31.5–35.7)
MCV: 92 fL (ref 79–97)
Monocytes Absolute: 0.4 10*3/uL (ref 0.1–0.9)
Monocytes: 7 %
Neutrophils Absolute: 3.7 10*3/uL (ref 1.4–7.0)
Neutrophils: 60 %
Platelets: 179 10*3/uL (ref 150–450)
RBC: 5.41 x10E6/uL (ref 4.14–5.80)
RDW: 12.9 % (ref 11.6–15.4)
WBC: 6.2 10*3/uL (ref 3.4–10.8)

## 2021-12-18 LAB — COMPREHENSIVE METABOLIC PANEL
ALT: 11 IU/L (ref 0–44)
AST: 17 IU/L (ref 0–40)
Albumin/Globulin Ratio: 2 (ref 1.2–2.2)
Albumin: 4.9 g/dL (ref 4.1–5.1)
Alkaline Phosphatase: 58 IU/L (ref 44–121)
BUN/Creatinine Ratio: 13 (ref 9–20)
BUN: 16 mg/dL (ref 6–24)
Bilirubin Total: 0.3 mg/dL (ref 0.0–1.2)
CO2: 26 mmol/L (ref 20–29)
Calcium: 10.1 mg/dL (ref 8.7–10.2)
Chloride: 98 mmol/L (ref 96–106)
Creatinine, Ser: 1.24 mg/dL (ref 0.76–1.27)
Globulin, Total: 2.5 g/dL (ref 1.5–4.5)
Glucose: 89 mg/dL (ref 70–99)
Potassium: 5.4 mmol/L — ABNORMAL HIGH (ref 3.5–5.2)
Sodium: 140 mmol/L (ref 134–144)
Total Protein: 7.4 g/dL (ref 6.0–8.5)
eGFR: 72 mL/min/{1.73_m2} (ref >59)

## 2021-12-18 LAB — AMMONIA: Ammonia: 57 ug/dL (ref 40–200)

## 2021-12-18 LAB — VALPROIC ACID LEVEL: Valproic Acid Lvl: 77 ug/mL (ref 50–100)

## 2021-12-21 ENCOUNTER — Telehealth: Payer: Self-pay

## 2021-12-21 NOTE — Telephone Encounter (Signed)
Shouldn't be any issue with Gatorade but is still recommended to intake 64-100oz of water per day.

## 2021-12-21 NOTE — Telephone Encounter (Signed)
-----   Message from Jessica McCue, NP sent at 12/21/2021  7:28 AM EDT ----- Please call patient for results: Your recent lab work showed slightly elevated potassium levels and would recommend you follow up with PCP Dr. Wilson to further discuss otherwise labs looked good without any concerning findings.   Please assess if patient is still experiencing increased fatigue. MyChart message previously sent to ensure there are no other associated symptoms such as altered mental status, confusion, weakness or any other seizure type activity since he has been discharged from the hospital or since wife has started to notice more fatigue. If not other symptoms present and fatigue still persists, may have to make some adjustments to antiseizure medication. Thank you 

## 2021-12-21 NOTE — Telephone Encounter (Signed)
Tried to call pt but the call did not go through as it was busy. Will try to call back later today.

## 2021-12-22 NOTE — Telephone Encounter (Signed)
-----   Message from Ihor Austin, NP sent at 12/21/2021  7:28 AM EDT ----- Please call patient for results: Your recent lab work showed slightly elevated potassium levels and would recommend you follow up with PCP Dr. Andrey Campanile to further discuss otherwise labs looked good without any concerning findings.   Please assess if patient is still experiencing increased fatigue. MyChart message previously sent to ensure there are no other associated symptoms such as altered mental status, confusion, weakness or any other seizure type activity since he has been discharged from the hospital or since wife has started to notice more fatigue. If not other symptoms present and fatigue still persists, may have to make some adjustments to antiseizure medication. Thank you

## 2021-12-24 ENCOUNTER — Encounter: Payer: 59 | Admitting: Occupational Therapy

## 2021-12-29 ENCOUNTER — Encounter: Payer: 59 | Admitting: Occupational Therapy

## 2021-12-30 ENCOUNTER — Ambulatory Visit (INDEPENDENT_AMBULATORY_CARE_PROVIDER_SITE_OTHER): Payer: 59 | Admitting: Family Medicine

## 2021-12-30 ENCOUNTER — Encounter: Payer: Self-pay | Admitting: Family Medicine

## 2021-12-30 VITALS — BP 117/79 | HR 66 | Temp 97.7°F | Resp 16 | Wt 143.6 lb

## 2021-12-30 DIAGNOSIS — F32A Depression, unspecified: Secondary | ICD-10-CM

## 2021-12-30 DIAGNOSIS — Z8673 Personal history of transient ischemic attack (TIA), and cerebral infarction without residual deficits: Secondary | ICD-10-CM | POA: Diagnosis not present

## 2021-12-30 DIAGNOSIS — I10 Essential (primary) hypertension: Secondary | ICD-10-CM | POA: Diagnosis not present

## 2021-12-30 DIAGNOSIS — Z87898 Personal history of other specified conditions: Secondary | ICD-10-CM

## 2021-12-30 DIAGNOSIS — F419 Anxiety disorder, unspecified: Secondary | ICD-10-CM

## 2021-12-30 DIAGNOSIS — G4709 Other insomnia: Secondary | ICD-10-CM

## 2021-12-30 DIAGNOSIS — R69 Illness, unspecified: Secondary | ICD-10-CM | POA: Diagnosis not present

## 2021-12-30 NOTE — Progress Notes (Signed)
Established Patient Office Visit  Subjective    Patient ID: Jesse [name], male    DOB: [birth year]  Age: 46 y.o. MRN: [id number]  CC:  Chief Complaint  Patient presents with   Follow-up    HFU    HPI Jesse [name] presents for hospital follow up after being admitted after having seizures. Patient is now being followed by neuro and has not had seizure activity noted since discharge.     Outpatient Encounter Medications as of 12/30/2021  Medication Sig   acetaminophen (TYLENOL) 325 MG tablet Take 1-2 tablets (325-650 mg total) by mouth every 4 (four) hours as needed for mild pain.   amLODipine (NORVASC) 5 MG tablet Take 0.5 tablets (2.5 mg total) by mouth in the morning and at bedtime.   aspirin 81 MG chewable tablet Chew 1 tablet (81 mg total) by mouth daily.   carvedilol (COREG) 6.25 MG tablet Take 1 tablet (6.25 mg total) by mouth 2 (two) times daily with a meal.   divalproex (DEPAKOTE) 500 MG DR tablet Take 1 tablet (500 mg total) by mouth 2 (two) times daily.   ferrous sulfate 325 (65 FE) MG tablet Take 1 tablet (325 mg total) by mouth daily.   gabapentin (NEURONTIN) 300 MG capsule Take 1 capsule (300 mg total) by mouth 2 (two) times daily AND 2 capsules (600 mg total) at bedtime.   levETIRAcetam (KEPPRA) 1000 MG tablet Take 1 tablet (1,000 mg total) by mouth every 12 (twelve) hours.   LORazepam (ATIVAN) 0.5 MG tablet Take 0.5 mg by mouth at bedtime as needed for sedation.   losartan (COZAAR) 50 MG tablet Take 1 tablet (50 mg total) by mouth daily.   Multiple Vitamin (MULTIVITAMIN WITH MINERALS) TABS tablet Take 1 tablet by mouth daily.   oxyCODONE-acetaminophen (PERCOCET) 10-325 MG tablet Take 0.5-1 tablets by mouth every 4 (four) hours as needed for pain.   pantoprazole sodium (PROTONIX) 40 mg/20 mL PACK Dissolve 1 packet in 20 mLs (40 mg total) and take by mouth daily. (Patient taking differently: Take 40 mg by mouth daily as needed (acid reflux).)   polyethylene glycol  (MIRALAX / GLYCOLAX) 17 g packet Take 17 g by mouth daily as needed for moderate constipation.   QUEtiapine (SEROQUEL) 50 MG tablet TAKE 1 TABLET BY MOUTH AT BEDTIME. AND 1/2 TABLET DURING THE DAY AS NEEDED FOR AGITATION (Patient taking differently: Take 50 mg by mouth at bedtime.)   rosuvastatin (CRESTOR) 20 MG tablet Take 1 tablet (20 mg total) by mouth daily.   tamsulosin (FLOMAX) 0.4 MG CAPS capsule TAKE 2 CAPSULES (0.8 MG TOTAL) BY MOUTH DAILY AFTER SUPPER.   No facility-administered encounter medications on file as of 12/30/2021.    Past Medical History:  Diagnosis Date   Anxiety    Depression    Essential hypertension    Myocardial infarction [facility])    S/P aortic dissection repair 11/05/2020   Straight graft replacement of ascending aorta and proximal transverse aortic arch with re-suspension of native aortic valve and open hemi arch distal anastomosis with aorta to right common carotid bypass and aorta to right subclavian bypass   Stroke [facility])     Past Surgical History:  Procedure Laterality Date   ASCENDING AORTIC ROOT REPLACEMENT N/A 11/04/2020   Procedure: 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 WITH REPLACEMENT OF ASCENDING AORTA AND HEMIARCH USING HEMASHIELD PLATINUM GRAFT AND HEMASHIELD GOLD 14 X GRAFT, RESUSPENSION OF NATIVE VALVE, AORTA TO RIGHT CAROTID BYPASS, AORTA TO RIGHT SUBCLAVIAN  BYPASS;  Surgeon: Purcell Nails, MD;  Location: [facility] OR;  Service: Open Heart Surgery;  Laterality: N/A;   TEE WITHOUT CARDIOVERSION N/A 11/04/2020   Procedure: TRANSESOPHAGEAL ECHOCARDIOGRAM (TEE);  Surgeon: Purcell Nails, MD;  Location: [facility] OR;  Service: Open Heart Surgery;  Laterality: N/A;    No family history on file.  Social History   Socioeconomic History   Marital status: Married    Spouse name: Not on file   Number of children: Not on file   Years of education: Not on file   Highest education level: 9th grade  Occupational History   Not on file  Tobacco Use    Smoking status: Former    Types: Cigarettes    Quit date: 11/04/2020    Years since quitting: 1.1   Smokeless tobacco: Never  Vaping Use   Vaping Use: Never used  Substance and Sexual Activity   Alcohol use: Yes    Alcohol/week: 2.0 standard drinks of alcohol    Types: 2 Cans of beer per week   Drug use: Not Currently   Sexual activity: Yes  Other Topics Concern   Not on file  Social History Narrative   Not on file   Social Determinants of Health   Financial Resource Strain: Not on file  Food Insecurity: Not on file  Transportation Needs: Not on file  Physical Activity: Not on file  Stress: Not on file  Social Connections: Not on file  Intimate Partner Violence: Not on file    Review of Systems  Neurological:  Negative for seizures.  All other systems reviewed and are negative.       Objective    BP 117/79   Pulse 66   Temp 97.7 F (36.5 C) (Oral)   Resp 16   Wt 143 lb 9.6 oz (65.1 kg)   SpO2 98%   BMI 17.48 kg/m   Physical Exam Vitals and nursing note reviewed.  Constitutional:      General: He is not in acute distress. HENT:     Head: Normocephalic and atraumatic.  Cardiovascular:     Rate and Rhythm: Normal rate and regular rhythm.  Pulmonary:     Effort: Pulmonary effort is normal.     Breath sounds: Normal breath sounds.  Abdominal:     Palpations: Abdomen is soft.     Tenderness: There is no abdominal tenderness.  Neurological:     General: No focal deficit present.     Mental Status: He is alert and oriented to person, place, and time.     Motor: Weakness present.  Psychiatric:        Mood and Affect: Mood normal.        Behavior: Behavior normal.         Assessment & Plan:   1. History of seizure No further seizure activity noted. Management per neuro  2. History of CVA (cerebrovascular accident) As above  3. Anxiety and depression Appears stable. Continue   4. Essential hypertension Appears stable. Continue and  monitor  5. Other insomnia Continue present management    No follow-ups on file.   Tommie Raymond, MD

## 2021-12-31 ENCOUNTER — Encounter: Payer: Self-pay | Admitting: Family Medicine

## 2021-12-31 ENCOUNTER — Encounter: Payer: 59 | Admitting: Occupational Therapy

## 2021-12-31 ENCOUNTER — Other Ambulatory Visit: Payer: Self-pay | Admitting: Family Medicine

## 2021-12-31 ENCOUNTER — Ambulatory Visit: Payer: Self-pay

## 2021-12-31 MED ORDER — ROSUVASTATIN CALCIUM 20 MG PO TABS: 20.0000 mg | ORAL_TABLET | Freq: Every day | ORAL | 1 refills | Status: DC

## 2021-12-31 NOTE — Telephone Encounter (Signed)
  Chief Complaint: wasp sting x 3  Symptoms: mild stinging sensation Frequency: 1605 Pertinent Negatives: Patient denies swelling, itching,  Disposition: [] ED /[] Urgent Care (no appt availability in office) / [] Appointment(In office/virtual)/ []  [facility]/ [x] Home Care/ [] Refused Recommended Disposition /[] [place] [facility]/ []  Follow-up with PCP Additional Notes: called pt's pharmacy  [facility] and spoke to [facility] (pharmacist) who stated po Benadryl is safe to take for short term itching. Advised pt is having facial swelling to give 2 pills or 50 mg and call 911. Unable to call back pt- call not going through sent MyChart message. Reason for Disposition  Normal local reaction to bee, wasp, or yellow jacket sting  Answer Assessment - Initial Assessment Questions 1. TYPE: "What type of sting was it?" (bee, yellow jacket, etc.)      3 wasps 2. ONSET: "When did it occur?"      1605 3. LOCATION: "Where is the sting located?"  "How many stings?"     Arm ant right arm, 2 on back of neck 4. SWELLING SIZE: "How big is the swelling?" (e.g., inches or cm)     no 5. REDNESS: "Is the area red or pink?" If Yes, ask: "What size is area of redness?" (e.g., inches or cm). "When did the redness start?"     Arm dot of redness 6. PAIN: "Is there any pain?" If Yes, ask: "How bad is it?"  (Scale 1-10; or mild, moderate, severe)    Mild sting 7. ITCHING: "Is there any itching?" If Yes, ask: "How bad is it?"      no 8. RESPIRATORY DISTRESS: "Describe your breathing."     normal 9. PRIOR REACTIONS: "Have you had any severe allergic reactions to stings in the past?" if yes, ask: "What happened?"     no 10. OTHER SYMPTOMS: "Do you have any other symptoms?" (e.g., abdomen pain, face or tongue swelling, new rash elsewhere, vomiting)       no 11. PREGNANCY: "Is there any chance you are pregnant?" "When was your last menstrual period?"       N/a  Protocols used: Bee or Yellow Jacket Sting-A-AH

## 2022-01-04 ENCOUNTER — Encounter: Payer: 59 | Admitting: Occupational Therapy

## 2022-01-05 ENCOUNTER — Encounter: Payer: 59 | Admitting: Occupational Therapy

## 2022-01-07 ENCOUNTER — Ambulatory Visit: Payer: 59 | Admitting: Internal Medicine

## 2022-01-11 ENCOUNTER — Encounter: Payer: 59 | Admitting: Occupational Therapy

## 2022-01-13 ENCOUNTER — Encounter: Payer: 59 | Admitting: Occupational Therapy

## 2022-01-14 ENCOUNTER — Encounter: Payer: Self-pay | Admitting: Occupational Therapy

## 2022-01-14 NOTE — Therapy (Signed)
[facility] [address] [place], [place], [address] Phone: [phone]   Fax:  [phone]  Patient Details  Name: Jesse [name] MRN: [id number] Date of Birth: August 05, 1975 Referring Provider:  No ref. provider found  Encounter Date: 01/14/2022  OCCUPATIONAL THERAPY DISCHARGE SUMMARY  Visits from Start of Care: 1 (eval)  Current functional level related to goals / functional outcomes: See eval as pt did not return after eval   Remaining deficits: See eval as pt did not return after eval   Education / Equipment: Not completed as pt did not return  Plan: Patient agrees to discharge.  Patient goals were not met. Patient is being discharged due to not returning since the last visit.  Pt did not return after eval due to financial reasons/they discovered that this center is out of network.         [facility], OTR/L 01/14/2022, 2:38 PM  [place] [address] [place], [place], [address] Phone: [phone]   Fax:  [phone]

## 2022-01-18 ENCOUNTER — Encounter: Payer: 59 | Admitting: Occupational Therapy

## 2022-01-21 ENCOUNTER — Encounter: Payer: 59 | Admitting: Occupational Therapy

## 2022-01-21 ENCOUNTER — Other Ambulatory Visit: Payer: Self-pay | Admitting: Family Medicine

## 2022-01-21 NOTE — Telephone Encounter (Signed)
Requested medications are due for refill today.  yes  Requested medications are on the active medications list.  yes  Last refill. 12/24/2021 #60 0 rf  Future visit scheduled.   no  Notes to clinic.  Missing lab.    Requested Prescriptions  Pending Prescriptions Disp Refills   tamsulosin (FLOMAX) 0.4 MG CAPS capsule [Pharmacy Med Name: TAMSULOSIN HCL 0.4 MG CAPSULE] 60 capsule 0    Sig: TAKE 2 CAPSULES (0.8 MG TOTAL) BY MOUTH DAILY AFTER SUPPER.     Urology: Alpha-Adrenergic Blocker Failed - 01/21/2022  2:34 PM      Failed - PSA in normal range and within 360 days    No results found for: "LABPSA", "PSA", "PSA1", "ULTRAPSA"       Passed - Last BP in normal range    BP Readings from Last 1 Encounters:  12/30/21 117/79         Passed - Valid encounter within last 12 months    Recent Outpatient Visits           3 weeks ago History of seizure   Primary Care at [facility], Clyde Canterbury, MD   5 months ago Hypotension, unspecified hypotension type   Primary Care at [facility], MD   8 months ago History of CVA (cerebrovascular accident)   Primary Care at [facility], MD   11 months ago Hypotension, unspecified hypotension type   Primary Care at [facility], MD   1 year ago History of CVA (cerebrovascular accident)   Primary Care at [facility], MD       Future Appointments             In 2 weeks Garvin Fila, MD [facility]

## 2022-01-25 ENCOUNTER — Encounter: Payer: 59 | Admitting: Occupational Therapy

## 2022-01-28 ENCOUNTER — Encounter: Payer: 59 | Admitting: Occupational Therapy

## 2022-02-09 ENCOUNTER — Telehealth: Payer: Self-pay | Admitting: Neurology

## 2022-02-09 ENCOUNTER — Encounter: Payer: Self-pay | Admitting: Neurology

## 2022-02-09 ENCOUNTER — Inpatient Hospital Stay: Payer: 59 | Admitting: Neurology

## 2022-02-09 NOTE — Telephone Encounter (Signed)
Pt called. Stated he was sick and needed to cancel appointment.

## 2022-02-20 ENCOUNTER — Other Ambulatory Visit: Payer: Self-pay | Admitting: Family Medicine

## 2022-02-28 ENCOUNTER — Other Ambulatory Visit: Payer: Self-pay | Admitting: Family Medicine

## 2022-03-01 NOTE — Telephone Encounter (Signed)
Requested medications are due for refill today.  yes  Requested medications are on the active medications list.  yes  Last refill. Both refilled 11/30/2021 #60 2 rf  Future visit scheduled.   no  Notes to clinic.  Both rx's written to expired 02/28/2022 - Rx's are expired.    Requested Prescriptions  Pending Prescriptions Disp Refills   levETIRAcetam (KEPPRA) 1000 MG tablet [Pharmacy Med Name: LEVETIRACETAM 1,000 MG TABLET] 60 tablet 2    Sig: TAKE 1 TABLET (1,000 MG TOTAL) BY MOUTH EVERY 12 HOURS     Neurology:  Anticonvulsants - levetiracetam Passed - 02/28/2022 10:32 AM      Passed - Cr in normal range and within 360 days    Creatinine, Ser  Date Value Ref Range Status  12/17/2021 1.24 0.76 - 1.27 mg/dL Final   Creatinine, Urine  Date Value Ref Range Status  11/20/2020 54.45 mg/dL Final    Comment:    Performed at [facility], [place] [address], [place], [place] [address]         Passed - Completed PHQ-2 or PHQ-9 in the last 360 days      Passed - Valid encounter within last 12 months    Recent Outpatient Visits           2 months ago History of seizure   Primary Care at [facility], Clyde Canterbury, MD   7 months ago Hypotension, unspecified hypotension type   Primary Care at [facility], Clyde Canterbury, MD   10 months ago History of CVA (cerebrovascular accident)   Primary Care at [facility], MD   1 year ago Hypotension, unspecified hypotension type   Primary Care at [facility], Clyde Canterbury, MD   1 year ago History of CVA (cerebrovascular accident)   Primary Care at [facility], MD               divalproex (DEPAKOTE) 500 MG DR tablet [Pharmacy Med Name: DIVALPROEX SOD DR 500 MG TAB] 60 tablet 2    Sig: TAKE 1 TABLET BY MOUTH TWICE A DAY     Neurology:  Anticonvulsants - Valproates Passed - 02/28/2022 10:32 AM      Passed - AST in normal range and within 360 days    AST  Date Value Ref Range Status   12/17/2021 17 0 - 40 IU/L Final         Passed - ALT in normal range and within 360 days    ALT  Date Value Ref Range Status  12/17/2021 11 0 - 44 IU/L Final         Passed - HGB in normal range and within 360 days    Hemoglobin  Date Value Ref Range Status  12/17/2021 17.7 13.0 - 17.7 g/dL Final         Passed - PLT in normal range and within 360 days    Platelets  Date Value Ref Range Status  12/17/2021 179 150 - 450 x10E3/uL Final         Passed - WBC in normal range and within 360 days    WBC  Date Value Ref Range Status  12/17/2021 6.2 3.4 - 10.8 x10E3/uL Final  11/28/2021 8.5 4.0 - 10.5 K/uL Final         Passed - HCT in normal range and within 360 days    Hematocrit  Date Value Ref Range Status  12/17/2021 50.0 37.5 - 51.0 % Final  Passed - Valproic Acid (serum) in normal range and within 360 days    Valproic Acid Lvl  Date Value Ref Range Status  12/17/2021 77 50 - 100 ug/mL Final    Comment:                                    Detection Limit = 4                            <4 indicates None Detected Toxicity may occur at levels of 100-500. Measurements of free unbound valproic acid may improve the assess- ment of clinical response.          Passed - Completed PHQ-2 or PHQ-9 in the last 360 days      Passed - Patient is not pregnant      Passed - Valid encounter within last 12 months    Recent Outpatient Visits           2 months ago History of seizure   Primary Care at [facility], Lauris Poag, MD   7 months ago Hypotension, unspecified hypotension type   Primary Care at [facility], MD   10 months ago History of CVA (cerebrovascular accident)   Primary Care at [facility], MD   1 year ago Hypotension, unspecified hypotension type   Primary Care at [facility], MD   1 year ago History of CVA (cerebrovascular accident)   Primary Care at [facility], MD

## 2022-03-03 ENCOUNTER — Encounter: Payer: Self-pay | Admitting: Family Medicine

## 2022-03-07 ENCOUNTER — Other Ambulatory Visit: Payer: Self-pay | Admitting: Physical Medicine & Rehabilitation

## 2022-03-08 ENCOUNTER — Other Ambulatory Visit: Payer: Self-pay | Admitting: Family Medicine

## 2022-03-30 ENCOUNTER — Ambulatory Visit (INDEPENDENT_AMBULATORY_CARE_PROVIDER_SITE_OTHER): Payer: Self-pay | Admitting: Family Medicine

## 2022-03-30 ENCOUNTER — Encounter: Payer: Self-pay | Admitting: Family Medicine

## 2022-03-30 VITALS — BP 140/80 | HR 70 | Temp 98.8°F | Ht 76.0 in | Wt 138.2 lb

## 2022-03-30 DIAGNOSIS — Z9889 Other specified postprocedural states: Secondary | ICD-10-CM

## 2022-03-30 DIAGNOSIS — I6349 Cerebral infarction due to embolism of other cerebral artery: Secondary | ICD-10-CM

## 2022-03-30 DIAGNOSIS — R569 Unspecified convulsions: Secondary | ICD-10-CM

## 2022-03-30 DIAGNOSIS — R208 Other disturbances of skin sensation: Secondary | ICD-10-CM

## 2022-03-30 MED ORDER — NALOXONE HCL 4 MG/0.1ML NA LIQD: 1.0000 | Freq: Once | NASAL | 1 refills | Status: AC

## 2022-03-30 MED ORDER — OXYCODONE-ACETAMINOPHEN 10-325 MG PO TABS: 1.0000 | ORAL_TABLET | ORAL | 0 refills | Status: DC | PRN

## 2022-03-30 NOTE — Patient Instructions (Addendum)
Welcome to [facility]! It was a pleasure meeting you today.  As discussed, Please schedule a 1 month follow up visit today.  Gabapentin 600mg  twice daily for 1-2 wks and if no help nor side effects, do 900 2x/day.  Merry Christmas  PLEASE NOTE:  If you had any LAB tests please let [birth year]17-1971 know if you have not heard back within a few days. You may see your results on MyChart before we have a chance to review them but we will give you a call once they are reviewed by [place]. If we ordered any REFERRALS today, please let us know if you have not heard from their office within the next week.  Let us know through MyChart if you are needing REFILLS, or have your pharmacy send [place] the request. You can also use MyChart to communicate with me or any office staff.  Please try these tips to maintain a healthy lifestyle:  Eat most of your calories during the day when you are active. Eliminate processed foods including packaged sweets (pies, cakes, cookies), reduce intake of potatoes, white bread, white pasta, and white rice. Look for whole grain options, oat flour or almond flour.  Each meal should contain half fruits/vegetables, one quarter protein, and one quarter carbs (no bigger than a computer mouse).  Cut down on sweet beverages. This includes juice, soda, and sweet tea. Also watch fruit intake, though this is a healthier sweet option, it still contains natural sugar! Limit to 3 servings daily.  Drink at least 1 glass of water with each meal and aim for at least 8 glasses per day  Exercise at least 150 minutes every week.

## 2022-03-30 NOTE — Progress Notes (Signed)
New Patient Office Visit  Subjective:  Patient ID: Jesse [name], male    DOB: 26-Aug-1975  Age: 46 y.o. MRN: [id number]  CC:  Chief Complaint  Patient presents with   Establish Care    Need new pcp Having issues with pain in feet Fasting     HPI-here w/wife Jesse [name] presents for new pt. Pain in feet  Since 11/04/20-a lot going on.  Prior, didn't see doctors.  On 11/04/20 was starting to mow lawn and Clutched chest and speech off so ER and vessel in heart ruptured and aneurysm and stroke. Then, Sz 11/27/21. Seeing neuro, on meds.  Feet pain achey/throb all day.  Intermitt areas of hyperalgesia.  All since CVA. 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  Gabapentin not helping-300mg  bid-prn, sometimes, more or less.  Not consistent.  Occ wife will give 2 at hs. Was having some fatigue so thought poss d/t gabapentin. Limps all day long. Has talked to neuro and told all from stroke.  Had been seeing Dr. Lloyd(PCP) in [place]-doing pain mgmt for pt, but then can't get to appts frequently. Told " Needs someone more local". Taking oxy q 4 hrs, but if more active, wears off in 3 hrs so takes again.  Takes 4-6 tabs/day.   No SI.   Hands numb on finger tips and can drop things. Gets very agrevated about health.  Moods-irrit since can't do what he used to.  Insomnia-long term.  Taking seroquel at hs helps sleep.   Was started on meds after hosp for cva. Not really depressed.    Past Medical History:  Diagnosis Date   Anxiety    Arthritis 10 years ago   Notice him having pains in joints- knees / back   Blood transfusion without reported diagnosis 11-04-20 ?   Possible during surgery/ hospital stay   CHF (congestive heart failure) (HCC) 11-04-20   Hospital stay   Depression    Essential hypertension    GERD (gastroesophageal reflux disease) 5 years ago   Started complaining about spicy food more- says hurt chest after eating   Heart murmur 11-04-20 ?   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 WITH REPLACEMENT OF ASCENDING AORTA AND HEMIARCH USING HEMASHIELD PLATINUM GRAFT AND HEMASHIELD GOLD 14 X GRAFT, RESUSPENSION OF NATIVE VALVE, AORTA TO RIGHT CAROTID BYPASS, AORTA TO RIGHT SUBCLAVIAN BYPASS;  Surgeon: Purcell Nails, MD;  Location: [facility] OR;  Service: Open Heart Surgery;  Laterality: N/A;   CARDIAC VALVE REPLACEMENT  11-04-20/11-05-20   Stroke sent him to hospital- had heart procedure   TEE WITHOUT CARDIOVERSION N/A 11/04/2020   Procedure: TRANSESOPHAGEAL ECHOCARDIOGRAM (TEE);  Surgeon: Purcell Nails, MD;  Location: [facility] OR;  Service: Open Heart Surgery;  Laterality: N/A;    Family History  Problem Relation Age of Onset   Hypertension Mother    Depression Mother    COPD Mother    Arthritis Mother    Cancer Father    Heart disease Father    Hyperlipidemia Father    Hypertension Father    Heart attack Father    Depression Sister    Hyperlipidemia Brother    Diabetes Brother    Hyperlipidemia Brother    Diabetes Brother    ADD / ADHD Son     Social History   Socioeconomic History   Marital status: Married    Spouse name: Not on file   Number of children: 3   Years of education: Not on file   Highest education level: 9th grade  Occupational History   Not on file   Tobacco Use   Smoking status: Some Days    Packs/day: 0.50    Years: 15.00    Total pack years: 7.50    Types: Cigarettes    Last attempt to quit: 11/04/2020    Years since quitting: 1.4    Passive exposure: Yes   Smokeless tobacco: Never   Tobacco comments:    Has quit since hospital stay but very aggitated  Vaping Use   Vaping Use: Never used  Substance and Sexual Activity   Alcohol use: Not Currently    Comment: none   Drug use: Not Currently    Types: Oxycodone, Other-see comments    Comment: For pain since hospital   Sexual activity: Not Currently    Birth control/protection: None  Other Topics Concern   Not on file  Social History Narrative   disabled   Social Determinants of Health   Financial Resource Strain: Not on file  Food Insecurity: Not on file  Transportation Needs: Not on file  Physical Activity: Not on file  Stress: Not on file  Social Connections: Not on file  Intimate Partner Violence: Not on file    ROS  ROS: Gen: no fever, chills  Skin: no rash, itching ENT: no ear pain, ear drainage, nasal congestion, rhinorrhea, sinus pressure, sore throat Eyes: no blurry vision, double vision Resp: no cough, wheeze,SOB CV: no CP, palpitations, LE edema,  GI: no heartburn, n/v/d/c, abd pain GU: no dysuria, urgency, frequency, hematuria MSK: some achey joints in cold Neuro: no dizziness, headache, weakness, vertigo Psych: HPI  Objective:   Today's Vitals: BP (!) 140/80   Pulse 70   Temp 98.8 F (37.1 C) (Temporal)   Ht 6\' 4"  (1.93 m)   Wt 138 lb 4 oz (62.7 kg)   SpO2 99%   BMI 16.83 kg/m   Physical Exam  Gen: WDWN NAD thin,lanky WM HEENT: NCAT, conjunctiva not injected, sclera nonicteric TM WNL B, OP moist, no exudates  NECK:  supple, no thyromegaly, no nodes, no carotid bruits CARDIAC: RRR, S1S2+, no murmur. DP 2+B LUNGS: CTAB. No wheezes ABDOMEN:  BS+, soft, NTND, No HSM, no masses EXT:  no edema MSK: limps NEURO: A&O x3.  CN II-XII  intact.  PSYCH: normal mood. Good eye contact   Feet-painful to light touch.  No boney tenderness nor to squeeze feet.  Hammar toes.  Cool toes.  DP 1-2+ B.    Spent w/pt and wife and then 10+min reviewing chart.   Assessment & Plan:   Problem List Items Addressed This Visit       Cardiovascular and Mediastinum   Cerebral embolism with cerebral infarction     Other   S/P aortic  dissection repair   Seizure [facility](HCC)   Other Visit Diagnoses     Dysesthesia of multiple sites    -  Primary     1.  Chronic pain feet since CVA.  Hyperesthesia as well.  He has been told that this is due to the stroke.  He is currently on Percocet 10/325 4 to 6 tablets/day.  Has been managed by primary care in [place], but was told he needs to get closer follow-up.  PDMP was checked.  I advised patient that I do not do long-term pain management control, however he is running out of medications and cannot just stop them.  He needs better pain management than 5-6 Percocet per day.  Advised to take Neurontin 600 mg twice daily and may need to increase from there if tolerated.  He has been taking intermittently and advised that needs to be on a consistent dose to see any benefit.  Will refer pain mgmt. follow-up in 1 month 2.  Seizure-newer diagnosis since August.  Currently controlled on Keppra and Depakote.  Needs to follow-up with neurology.  Continue meds 3.  History of large CVA, aortic dissection.  A lot of residual defects and pain.  Patient is very frustrated with lack of progress.  Advised that unfortunately this is probably an aftereffect of the stroke.  Needs to follow-up with neurology.  Not sure if there is another process going on.  I will review records.  Follow-up in 1 month  Outpatient Encounter Medications as of 03/30/2022  Medication Sig   acetaminophen (TYLENOL) 325 MG tablet Take 1-2 tablets (325-650 mg total) by mouth every 4 (four) hours as needed for mild pain.   amLODipine (NORVASC) 5 MG  tablet Take 0.5 tablets (2.5 mg total) by mouth in the morning and at bedtime.   aspirin 81 MG chewable tablet Chew 1 tablet (81 mg total) by mouth daily.   carvedilol (COREG) 6.25 MG tablet Take 1 tablet (6.25 mg total) by mouth 2 (two) times daily with a meal.   divalproex (DEPAKOTE) 500 MG DR tablet TAKE 1 TABLET BY MOUTH TWICE A DAY   gabapentin (NEURONTIN) 300 MG capsule Take 1 capsule (300 mg total) by mouth 2 (two) times daily AND 2 capsules (600 mg total) at bedtime.   levETIRAcetam (KEPPRA) 1000 MG tablet TAKE 1 TABLET (1,000 MG TOTAL) BY MOUTH EVERY 12 HOURS   LORazepam (ATIVAN) 0.5 MG tablet Take 0.5 mg by mouth at bedtime as needed for sedation.   losartan (COZAAR) 50 MG tablet Take 1 tablet (50 mg total) by mouth daily.   Multiple Vitamins-Minerals (MULTIVITAMIN MEN PO) Take 1 tablet by mouth daily.   naloxone (NARCAN) nasal spray 4 mg/0.1 mL Place 1 spray into the nose once for 1 dose.   pantoprazole sodium (PROTONIX) 40 mg/20 mL PACK Dissolve 1 packet in 20 mLs (40 mg total) and take by mouth daily. (Patient taking differently: Take 40 mg by mouth daily as needed (acid reflux).)   QUEtiapine (SEROQUEL) 50 MG tablet TAKE 1 TABLET BY MOUTH AT BEDTIME. AND 1/2 TABLET DURING THE DAY AS NEEDED FOR AGITATION (Patient taking differently: Take 50 mg by mouth at bedtime.)   rosuvastatin (CRESTOR) 20 MG tablet Take 1 tablet (20 mg total) by mouth daily.   tamsulosin (FLOMAX) 0.4 MG CAPS capsule TAKE 2 CAPSULES (0.8 MG TOTAL) BY MOUTH DAILY AFTER SUPPER.   [DISCONTINUED] oxyCODONE-acetaminophen (PERCOCET) 10-325 MG tablet Take 0.5-1 tablets by mouth every 4 (four) hours as needed for pain.  ferrous sulfate 325 (65 FE) MG tablet Take 1 tablet (325 mg total) by mouth daily.   oxyCODONE-acetaminophen (PERCOCET) 10-325 MG tablet Take 1 tablet by mouth every 4 (four) hours as needed for pain.   [DISCONTINUED] polyethylene glycol (MIRALAX / GLYCOLAX) 17 g packet Take 17 g by mouth daily as needed for  moderate constipation. (Patient not taking: Reported on 03/30/2022)   No facility-administered encounter medications on file as of 03/30/2022.    Follow-up: Return in about 4 weeks (around 04/27/2022) for meds.   Angelena Sole, MD

## 2022-04-13 ENCOUNTER — Other Ambulatory Visit: Payer: Self-pay | Admitting: Family Medicine

## 2022-04-13 MED ORDER — OXYCODONE-ACETAMINOPHEN 10-325 MG PO TABS: 1.0000 | ORAL_TABLET | ORAL | 0 refills | Status: DC | PRN

## 2022-04-13 NOTE — Telephone Encounter (Signed)
Last OV; 03/30/22  Next OV: 04/27/22  Last filled: 03/30/22  Quantity: 75 tablets w/ 0 refills

## 2022-04-21 NOTE — Progress Notes (Unsigned)
[facility] [address] [place]. [place] [address] [phone]       STROKE FOLLOW UP NOTE  [name] Date of Birth:  1976-04-02 Medical Record Number:  [id number]   Reason for Referral: stroke follow up    SUBJECTIVE:   CHIEF COMPLAINT:  No chief complaint on file.   HPI:  Update 04/22/2022 JM: returns for 6 month f/u.   ED admission 8/4 due to change in mental status and global aphasia with episode of unresponsiveness with abnormal eye movement and posturing of left arm.  Also noted left gaze preference and right-sided weakness.  CT head and MRI negative however episode of witnessed seizure in MRI aborted with Ativan.  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  Denies any additional seizure activity, remains on Depakote and Keppra.  Denies any new stroke/TIA symptoms.  Residual aphasia ***.  Continued visual impairment ***.  Dysesthesias ***.    Compliant on aspirin and Crestor Blood pressure well-controlled Closely followed by PCP and cardiology    History provided for reference purposes only Update 10/19/2021 JM: Patient returns for stroke follow-up after prior visit 5 months ago.  Accompanied by his wife.  Overall stable without new stroke/TIA symptoms.  Completed SLP back in April and PT back in March.  Reports residual aphasia with some improvement since prior visit.  Plans on restarting SLP around October due to limited visits. Visual impairment persistent since prior visit, has appt with Dr. Katy [name] 6/28.  Paresthesias persist now in both legs up to knees and right hand.  On gabapentin 300mg  TID - no noticeable benefit . Dr. Letta [name] recommended pursuing EMG/NCV for right hand symptoms but patient did not pursue as he was more concerned regarding feet pain. He has not yet has f/u visit. Due  to this pain, his overall ambulation and gait has been impacted. Mood has not been great, has not slept well and decreased appetite especially over the past 2 weeks. He has not yet been seen by Jesse [name].   Compliant on aspirin and Crestor, denies side effects.  Blood pressure today 112/82.  Continues to follow closely with cardiothoracic, cardiology and PCP.   No further concerns at this time  Update 05/20/2021 JM: Returns for 15-month stroke follow-up accompanied by his wife. Overall doing well without new stroke/TIA symptoms. Residual aphasia improving, recently restarted SLP.  Residual gait impairment with imbalance gradually improving, recently restarted PT.  Denies any recent falls. Visual impairment persistent since prior visit - reports limited vision upper and lower quadrants bilaterally.  Occasional double vision.  Paresthesias continued dysesthesias left leg and right hand - can be worse after prolonged ambulation or standing - occasionally painful - placed on gabapentin 100mg  BID - only taking 1 cap at night due to fatigue on BID dosing - believes some improvement. Compliant on aspirin and Crestor without side effects.  Blood pressure today 112/83.  Routinely monitors at home and typically stable.  Routinely follows with PCP and cardiology. Eval by genetics counselor 01/2021 currently awaiting genetic testing to assess for connective tissue disorder.  Reports complete tobacco cessation since prior visit.  No further concerns at this time  Initial visit 02/03/2021 JM: Patient is being seen for [name] follow-up accompanied by his wife who provides majority of history  Reports residual:  Expressive aphasia -has been gradually improving.  Currently working with SLP.  Per wife, able to participate in more conversations Denies residual receptive aphasia  Gait impairment with imbalance -gradually improving.  Currently working with PT Pins/needles right hand and left foot - painful and sensitive. Wife  questioning if this is interfering with imbalance Vision impairment - continued difficulty seeing the ground or reading. Will have to tilt his head to help compensate.   Worsening depression/anxiety: started on mirtazapine by PCP but not yet started (due to concern of interaction with Seroquel).  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  Wife is in the process of applying for Social Security disability.  He has since been approved for Medicaid.  Compliant on aspirin and Crestor without side effects.  Blood pressure today 106/64.  Routinely monitors at home per wife, has been on the lower side especially after medications.  No further concerns at this time   Stroke admission 11/04/2020 [name] is a 46 y.o. male with history of anxiety and longstanding tobacco and alcohol use, not seeing doctor for 22 years admitted on 11/04/2020 for chest pain, confusion, headache, blurry vision and aphasia.  Initial CT head unremarkable.  CTA found to have aortic dissection requiring emergent repair by Jesse [name].  Complicated [name] course with CT head showed relatively large infarct with cytotoxic edema in posterior right MCA and MCA/PCA watershed territories with development of petechial hemorrhage. MRI large right MCA infarct with mild cytotoxic edema and rightward midline shift with tiny punctate bilateral cerebral and cerebellar embolic infarcts.  MRA head/neck interval revascularization of right ICA and MCA and right aorto to subclavian bypass with patent right carotid artery.  EF 55 to 60%.  On 7/15, patient increasing O2 requirements and reintubated with postop course additionally complicated by acute blood loss anemia, coagulopathy, left antecubital superficial venous thrombosis (started on Keflex, Vanco and  heparin drip) and persistent hyperactive delirium requiring Precedex and eventually transition to Klonopin, Seroquel, valproic acid and Ativan as needed.  EEG 7/16 focal right hemispheric slowing and mild generalized slowing but no definite epileptiform activity.  LDL 49.  A1c 5.2.  Started on aspirin 81 mg daily per CTS. On 7/20, rapid response called for anisicoria.  Repeat CT head 7/20 interval development of petechial hemorrhage associated with evolving posterior right MCA and MCA/PCA watershed territory infarct with cytotoxic edema and mass-effect mildly progressed with approximately 4 mm of leftward midline shift at the foramen of Monro (previously 3 mm when remeasured).   Eventually transferred to CIR on 7/21 as mobility declining any more scheduled rehab routinely improved delirium.  On 7/23, patient found to be apneic with pulse and blood pressure 72/54 and unresponsive and code initiated requiring reintubation likely due to aspiration pneumonia as well as COVID-positive state.  Extubated 7/23 and repeat CT head showed evolving right MCA territory infarct with decreasing edema.  Poor p.o. intake therefore core track placed.  Continue to have issues with poor attention, global aphasia with cognitive deficits and unsteady gait and was transferred back to CIR on 8/4.  During CIR admission, mood stable and weaned off Seroquel, renal status improved, urinary retention requiring Foley placement, improvement of p.o. intake with tube feeds discontinued 8/6 on dysphagia 2 diet and completed course of antibiotics for COVID-pneumonia.  Is eventually discharged home on 12/11/2020 with home health therapies.  PERTINENT IMAGING  CT HEAD 11/04/2020 IMPRESSION: No acute intracranial hemorrhage. Question subtle small loss of gray-white differentiation in the right insular region (ASPECT score is 9).  11/06/2020 IMPRESSION: 1. Relatively large infarct with cytotoxic edema in the posterior Right MCA  and MCA/PCA watershed territories. Mild mass effect including trace leftward midline shift. No hemorrhagic transformation. 2. Preserved gray-white matter differentiation elsewhere.  11/12/2020 IMPRESSION: In comparison to CT head from 11/06/2020, interval development of petechial hemorrhage associated with the evolving posterior right MCA and MCA/PCA watershed territory infarct. No mass occupying hemorrhagic transformation. Cytotoxic edema and mass effect is mildly progressed with approximately 4 mm of leftward midline shift at the foramen of Monro (previously 3 mm when remeasured).  11/14/2020 IMPRESSION: Acute infarct in the posterior right MCA territory. There is progressive gyral intermediate density throughout the infarct with significant change from 2 days ago. It is unclear if this is evolution of infarct with laminar necrosis versus petechial hemorrhage related to hemorrhagic transformation. Differentiation of these 2 findings could be best performed with MRI.  11/18/2020 IMPRESSION: Evolving posterior right MCA territory infarction with decreased edema and mild mass effect. No new hemorrhage.   New acute or subacute small bilateral cerebellar infarcts. Some may have been subtly present on the prior CT.   CT ANGIO  IMPRESSION: Partially imaged type A aortic dissection involving the ascending aorta and transverse portion but not the descending aorta.   Dissection involves innominate artery with thrombosis of false lumen. This extends into the right common carotid with narrowing of the true lumen to a minimal diameter of 1.5 mm. Minimal enhancement within the right cervical and intracranial ICA.   Reconstitution at the right ICA terminus. No proximal intracranial vessel occlusion. 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   MR BRAIN MRA HEAD/NECK IMPRESSION: MRI HEAD IMPRESSION: 1. Moderately large  evolving acute ischemic nonhemorrhagic right MCA and MCA/PCA watershed infarct, relatively stable in distribution as compared to previous CT. Associated regional mass effect with 5 mm of right-to-left shift, slightly increased from prior. 2. Additional scattered subcentimeter acute ischemic infarcts involving the bilateral cerebral hemispheres and right cerebellum, embolic in nature. No associated hemorrhage or mass effect. 3. No other acute intracranial abnormality.   MRA HEAD IMPRESSION: 1. Interval re-establishment of flow within the right ICA, now patent to the terminus. Right MCA well perfused, with no visible persistent downstream occlusion evident by MRA. 2. Otherwise stable and unremarkable intracranial MRA.   MRA NECK IMPRESSION: 1. 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 2. Right carotid artery system supplied via the right subclavian bypass, and is now widely patent without residual dissection or other abnormality. 3. Left carotid artery system supplied via the native aortic arch, and remains widely patent without stenosis or other abnormality. 4. 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 Follow-up examination with dedicated CTA could be performed for further evaluation of this finding as warranted. 5. Both vertebral arteries remain widely patent within the neck. Right vertebral artery dominant.      ROS:   14 system review of systems performed and negative with exception of those listed in HPI  PMH:  Past Medical History:  Diagnosis Date   Anxiety    Arthritis 10 years ago   Notice him having pains in joints- knees / back   Blood transfusion  without reported diagnosis 11-04-20 ?   Possible during surgery/ [name] stay   CHF (congestive heart failure) ([place]) 11-04-20   [name] stay    Depression    Essential hypertension    GERD (gastroesophageal reflux disease) 5 years ago   Started complaining about spicy food more- says hurt chest after eating   Heart murmur 11-04-20 ?   Possible during [name] stay   Myocardial infarction [facility])    Neuromuscular disorder ([place]) 11-04-20   Since Stroke 11-04-2020   S/P aortic dissection repair 11/05/2020   Straight graft replacement of ascending aorta and proximal transverse aortic arch with re-suspension of native aortic valve and open hemi arch distal anastomosis with aorta to right common carotid bypass and aorta to right subclavian bypass   Seizures ([place]) [id number]   Stroke ([place])    Ulcer ?   Possible stomach ulcer- complained of stomach hurting after spicy food and or stress related    PSH:  Past Surgical History:  Procedure Laterality Date   ASCENDING AORTIC ROOT REPLACEMENT N/A 11/04/2020   Procedure: 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 WITH REPLACEMENT OF ASCENDING AORTA AND HEMIARCH USING HEMASHIELD PLATINUM 26MM GRAFT AND HEMASHIELD GOLD 14 X 8MM GRAFT, RESUSPENSION OF NATIVE VALVE, AORTA TO RIGHT CAROTID BYPASS, AORTA TO RIGHT SUBCLAVIAN BYPASS;  Surgeon: Rexene Alberts, MD;  Location: [facility];  Service: Open Heart Surgery;  Laterality: N/A;   CARDIAC VALVE REPLACEMENT  11-04-20/11-05-20   Stroke sent him to [name]- had heart procedure   TEE WITHOUT CARDIOVERSION N/A 11/04/2020   Procedure: TRANSESOPHAGEAL ECHOCARDIOGRAM (TEE);  Surgeon: Rexene Alberts, MD;  Location: [facility];  Service: Open Heart Surgery;  Laterality: N/A;    Social History:  Social History   Socioeconomic History   Marital status: Married    Spouse name: Not on file   Number of children: 3   Years of education: Not on file   Highest education level: 9th grade  Occupational History   Not on file  Tobacco Use   Smoking status: Some Days    Packs/day: 0.50    Years: 15.00    Total pack years: 7.50    Types: Cigarettes    Last attempt  to quit: 11/04/2020    Years since quitting: 1.4    Passive exposure: Yes   Smokeless tobacco: Never   Tobacco comments:    Has quit since [name] stay but very aggitated  Vaping Use   Vaping Use: Never used  Substance and Sexual Activity   Alcohol use: Not Currently    Comment: none   Drug use: Not Currently    Types: Oxycodone, Other-see comments    Comment: For pain since [name]   Sexual activity: Not Currently    Birth control/protection: None  Other Topics Concern   Not on file  Social History Narrative   disabled   Social Determinants of Health   Financial Resource Strain: Not on file  Food Insecurity: Not on file  Transportation Needs: Not on file  Physical Activity: Not on file  Stress: Not on file  Social Connections: Not on file  Intimate Partner Violence: Not on file    Family History:  Family History  Problem Relation Age of Onset   Hypertension Mother    Depression Mother    COPD Mother    Arthritis Mother    Cancer Father    Heart disease Father    Hyperlipidemia Father    Hypertension Father    Heart attack Father  Depression Sister    Hyperlipidemia Brother    Diabetes Brother    Hyperlipidemia Brother    Diabetes Brother    ADD / ADHD Son     Medications:   Current Outpatient Medications on File Prior to Visit  Medication Sig Dispense Refill   acetaminophen (TYLENOL) 325 MG tablet Take 1-2 tablets (325-650 mg total) by mouth every 4 (four) hours as needed for mild pain.     amLODipine (NORVASC) 5 MG tablet Take 0.5 tablets (2.5 mg total) by mouth in the morning and at bedtime. 90 tablet 3   aspirin 81 MG chewable tablet Chew 1 tablet (81 mg total) by mouth daily.     carvedilol (COREG) 6.25 MG tablet Take 1 tablet (6.25 mg total) by mouth 2 (two) times daily with a meal. 90 tablet 3   divalproex (DEPAKOTE) 500 MG DR tablet TAKE 1 TABLET BY MOUTH TWICE A DAY 60 tablet 2   ferrous sulfate 325 (65 FE) MG tablet Take 1 tablet (325 mg total)  by mouth daily. 30 tablet 3   gabapentin (NEURONTIN) 300 MG capsule Take 1 capsule (300 mg total) by mouth 2 (two) times daily AND 2 capsules (600 mg total) at bedtime. 120 capsule 5   levETIRAcetam (KEPPRA) 1000 MG tablet TAKE 1 TABLET (1,000 MG TOTAL) BY MOUTH EVERY 12 HOURS 60 tablet 2   LORazepam (ATIVAN) 0.5 MG tablet Take 0.5 mg by mouth at bedtime as needed for sedation.     losartan (COZAAR) 50 MG tablet Take 1 tablet (50 mg total) by mouth daily. 90 tablet 3   Multiple Vitamins-Minerals (MULTIVITAMIN MEN PO) Take 1 tablet by mouth daily.     oxyCODONE-acetaminophen (PERCOCET) 10-325 MG tablet Take 1 tablet by mouth every 4 (four) hours as needed for pain. 75 tablet 0   pantoprazole sodium (PROTONIX) 40 mg/20 mL PACK Dissolve 1 packet in 20 mLs (40 mg total) and take by mouth daily. (Patient taking differently: Take 40 mg by mouth daily as needed (acid reflux).) 30 mL 0   QUEtiapine (SEROQUEL) 50 MG tablet TAKE 1 TABLET BY MOUTH AT BEDTIME. AND 1/2 TABLET DURING THE DAY AS NEEDED FOR AGITATION (Patient taking differently: Take 50 mg by mouth at bedtime.) 135 tablet 0   rosuvastatin (CRESTOR) 20 MG tablet Take 1 tablet (20 mg total) by mouth daily. 90 tablet 1   tamsulosin (FLOMAX) 0.4 MG CAPS capsule TAKE 2 CAPSULES (0.8 MG TOTAL) BY MOUTH DAILY AFTER SUPPER. 180 capsule 1   No current facility-administered medications on file prior to visit.    Allergies:  No Known Allergies    OBJECTIVE:  Physical Exam  There were no vitals filed for this visit.  There is no height or weight on file to calculate BMI. No results found.   General: Frail very pleasant middle-age Caucasian male, seated, in no evident distress Head: head normocephalic and atraumatic.   Neck: supple with no carotid or supraclavicular bruits Cardiovascular: regular rate and rhythm, no murmurs Musculoskeletal: no deformity Skin:  no rash/petichiae Vascular:  Normal pulses all extremities   Neurologic  Exam Mental Status: Awake and fully alert. Moderate expressive aphasia. Able to speak in short sentences.  Follows commands without difficulty.  Oriented to place and time. Recent memory mildly impaired and remote memory intact. Attention span, concentration and fund of knowledge appropriate. Mood and affect appropriate.  Cranial Nerves: Pupils equal, briskly reactive to light. Extraocular movements full without nystagmus. OD turns in with vertical movements and turns  out with convergence. Visual fields lower visual field impairment bilaterally. Hearing intact. Facial sensation intact. Face, tongue, palate moves normally and symmetrically.  Motor: Normal bulk and tone. Normal strength in all tested extremity muscles Sensory.: intact to touch , pinprick , position and vibratory sensation.  Subjective right hand and bilateral LE dysesthesias  Coordination: Rapid alternating movements normal in all extremities. Finger-to-nose and heel-to-shin performed accurately bilaterally. Gait and Station: Arises from chair without difficulty. Stance is normal. Gait demonstrates normal stride length and mild imbalance with slight favoring of leg leg without use of assistive device.  Mild difficulty with tandem walk and heel/toe.  Reflexes: 1+ and symmetric. Toes downgoing.          ASSESSMENT: Jesse [name] is a 46 y.o. 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 Vascular risk factors include HTN, hx of tobacco and EtOH use, and s/p aortic dissection repair.  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 Increase gabapentin night time dose to 600mg  and continue 300mg  twice daily.  Advised to schedule f/u with PMR for further management. Discussed establishing care with [name] for depression and insomnia concerns. F/u with Dr. Katy [name] 6/28. Plans on restart therapies in October  Continue aspirin 81 mg daily  and Crestor 20 mg daily for secondary stroke prevention.   Discussed secondary stroke prevention measures and importance of close PCP follow up for aggressive stroke risk factor management including HTN with BP goal<130/90 and HLD with LDL goal<70.  Has not had recent lab work - patient declined having completed today. Advised to schedule f/u with PCP to complete lab work including A1c, lipid panel, B12, vit D and TSH (for reversible causes of numbness especially in b/l LE now) I have gone over the pathophysiology of stroke, warning signs and symptoms, risk factors and their management in some detail with instructions to go to the closest emergency room for symptoms of concern. Seizure, late effect of stroke:  No additional seizures Continue Depakote and Keppra Check lab work today  S/p aortic dissection repair: Routinely followed by cardiology.  Released from cardiothoracic surgery.  Currently being worked up for marfanoid or connective tissue disorder - awaiting genetic testing     Follow up in 6 months or call earlier if needed   CC:  PCP: Tawnya Crook, MD    I spent 37 minutes of face-to-face and non-face-to-face time with patient and wife.  This included previsit chart review, lab review, study review, order entry, electronic health record documentation, patient and wife education regarding prior stroke with residual deficits, secondary stroke prevention measures and importance of managing stroke risk factors and answered all other questions to patient and wife's satisfaction  Frann Rider, AGNP-BC  [facility] [address] [place], [place] [address]  Phone [phone] Fax [phone] Note: This document was prepared  with digital dictation and possible smart phrase technology. Any transcriptional errors that result from this process are unintentional.

## 2022-04-22 ENCOUNTER — Other Ambulatory Visit: Payer: Self-pay | Admitting: Family Medicine

## 2022-04-22 ENCOUNTER — Ambulatory Visit (INDEPENDENT_AMBULATORY_CARE_PROVIDER_SITE_OTHER): Payer: Self-pay | Admitting: Adult Health

## 2022-04-22 ENCOUNTER — Encounter: Payer: Self-pay | Admitting: Adult Health

## 2022-04-22 VITALS — BP 171/114 | HR 85 | Ht 76.0 in | Wt 139.2 lb

## 2022-04-22 DIAGNOSIS — R208 Other disturbances of skin sensation: Secondary | ICD-10-CM

## 2022-04-22 DIAGNOSIS — I69398 Other sequelae of cerebral infarction: Secondary | ICD-10-CM

## 2022-04-22 DIAGNOSIS — H547 Unspecified visual loss: Secondary | ICD-10-CM

## 2022-04-22 DIAGNOSIS — I63131 Cerebral infarction due to embolism of right carotid artery: Secondary | ICD-10-CM

## 2022-04-22 DIAGNOSIS — I69320 Aphasia following cerebral infarction: Secondary | ICD-10-CM

## 2022-04-22 DIAGNOSIS — R569 Unspecified convulsions: Secondary | ICD-10-CM

## 2022-04-22 MED ORDER — LEVETIRACETAM 1000 MG PO TABS: 1000.0000 mg | ORAL_TABLET | Freq: Two times a day (BID) | ORAL | 3 refills | Status: DC

## 2022-04-22 MED ORDER — DIVALPROEX SODIUM 500 MG PO DR TAB: 500.0000 mg | DELAYED_RELEASE_TABLET | Freq: Two times a day (BID) | ORAL | 3 refills | Status: DC

## 2022-04-22 MED ORDER — OXYCODONE-ACETAMINOPHEN 10-325 MG PO TABS: 1.0000 | ORAL_TABLET | ORAL | 0 refills | Status: DC | PRN

## 2022-04-22 NOTE — Patient Instructions (Addendum)
Please let me know when you would like to see neuro-ophthalmology for further evaluation   Continue Depakote and Keppra for seizure prevention   Increase gabapentin dosage - continue 300mg  afternoon and take 900mg  nightly Please to establish care with pain management when able  Continue aspirin 81 mg daily  and Crestor  for secondary stroke prevention  Continue to follow up with PCP regarding cholesterol and blood pressure management  Maintain strict control of hypertension with blood pressure goal below 130/90 and cholesterol with LDL cholesterol (bad cholesterol) goal below 70 mg/dL.   Signs of a Stroke? Follow the BEFAST method:  Balance Watch for a sudden loss of balance, trouble with coordination or vertigo Eyes Is there a sudden loss of vision in one or both eyes? Or double vision?  Face: Ask the person to smile. Does one side of the face droop or is it numb?  Arms: Ask the person to raise both arms. Does one arm drift downward? Is there weakness or numbness of a leg? Speech: Ask the person to repeat a simple phrase. Does the speech sound slurred/strange? Is the person confused ? Time: If you observe any of these signs, call 911.    Followup in the future with me in 7 months or call earlier if needed       Thank you for coming to see at [facility]. I hope we have been able to provide you high quality care today.  You may receive a patient satisfaction survey over the next few weeks. We would appreciate your feedback and comments so that we may continue to improve ourselves and the health of our patients.

## 2022-04-27 ENCOUNTER — Ambulatory Visit: Payer: Medicaid Other | Admitting: Family Medicine

## 2022-04-27 ENCOUNTER — Encounter: Payer: Self-pay | Admitting: Family Medicine

## 2022-04-27 VITALS — BP 150/90 | HR 74 | Temp 99.1°F | Ht 76.0 in | Wt 145.0 lb

## 2022-04-27 DIAGNOSIS — I6330 Cerebral infarction due to thrombosis of unspecified cerebral artery: Secondary | ICD-10-CM

## 2022-04-27 DIAGNOSIS — R208 Other disturbances of skin sensation: Secondary | ICD-10-CM

## 2022-04-27 DIAGNOSIS — Z79899 Other long term (current) drug therapy: Secondary | ICD-10-CM | POA: Diagnosis not present

## 2022-04-27 DIAGNOSIS — I10 Essential (primary) hypertension: Secondary | ICD-10-CM | POA: Diagnosis not present

## 2022-04-27 DIAGNOSIS — F32A Depression, unspecified: Secondary | ICD-10-CM

## 2022-04-27 DIAGNOSIS — Z1159 Encounter for screening for other viral diseases: Secondary | ICD-10-CM | POA: Diagnosis not present

## 2022-04-27 DIAGNOSIS — R569 Unspecified convulsions: Secondary | ICD-10-CM | POA: Diagnosis not present

## 2022-04-27 DIAGNOSIS — F419 Anxiety disorder, unspecified: Secondary | ICD-10-CM | POA: Diagnosis not present

## 2022-04-27 DIAGNOSIS — G4709 Other insomnia: Secondary | ICD-10-CM | POA: Insufficient documentation

## 2022-04-27 MED ORDER — AMLODIPINE BESYLATE 10 MG PO TABS: 10.0000 mg | ORAL_TABLET | Freq: Every day | ORAL | 1 refills | Status: DC

## 2022-04-27 NOTE — Progress Notes (Signed)
Subjective:     Patient ID: Jesse [name], male    DOB: 09-12-1975, 47 y.o.   MRN: [id number]  Chief Complaint  Patient presents with   Follow-up    4 week follow-up on medication Fasting     HPI-here w/wife  Sz-doing ok on med.  Just saw neuro.  No further sz.  Pain chronic.  Insurance issues so working on that.  Working on getting appt w/pain mgmt. Taking oxy q 3-6 hrs. Allows him to be more active, but being more active increases pain.  Gabapentin 600 mg at noon and 900 at hs.  Was drowsy when in am but that was after sz..  Moods-doing better. No SI Insomnia-long term-used to only sleep 3 hrs and did fine.  HTN-has been elevated.  Cuff broken.   Health Maintenance Due  Topic Date Due   Hepatitis C Screening  Never done    Past Medical History:  Diagnosis Date   Anxiety    Arthritis 10 years ago   Notice him having pains in joints- knees / back   Blood transfusion without reported diagnosis 11-04-20 ?   Possible during surgery/ hospital stay   CHF (congestive heart failure) (HCC) 11-04-20   Hospital stay   Depression    Essential hypertension    GERD (gastroesophageal reflux disease) 5 years ago   Started complaining about spicy food more- says hurt chest after eating   Heart murmur 11-04-20 ?   Possible during hospital stay   Myocardial infarction [facility])    Neuromuscular disorder (HCC) 11-04-20   Since Stroke 11-04-2020   S/P aortic dissection repair 11/05/2020   Straight graft replacement of ascending aorta and proximal transverse aortic arch with re-suspension of native aortic valve and open hemi arch distal anastomosis with aorta to right common carotid bypass and aorta to right subclavian bypass   Seizures (HCC) [id number]   Stroke (HCC)    Ulcer ?   Possible stomach ulcer- complained of stomach hurting after spicy food and or stress related    Past Surgical History:  Procedure Laterality Date   ASCENDING AORTIC ROOT REPLACEMENT N/A 11/04/2020   Procedure:  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 WITH REPLACEMENT OF ASCENDING AORTA AND HEMIARCH USING HEMASHIELD PLATINUM GRAFT AND HEMASHIELD GOLD 14 X GRAFT, RESUSPENSION OF NATIVE VALVE, AORTA TO RIGHT CAROTID BYPASS, AORTA TO RIGHT SUBCLAVIAN BYPASS;  Surgeon: Purcell Nails, MD;  Location: [facility] OR;  Service: Open Heart Surgery;  Laterality: N/A;   CARDIAC VALVE REPLACEMENT  11-04-20/11-05-20   Stroke sent him to hospital- had heart procedure   TEE WITHOUT CARDIOVERSION N/A 11/04/2020   Procedure: TRANSESOPHAGEAL ECHOCARDIOGRAM (TEE);  Surgeon: Purcell Nails, MD;  Location: [facility] OR;  Service: Open Heart Surgery;  Laterality: N/A;    Outpatient Medications Prior to Visit  Medication Sig Dispense Refill   acetaminophen (TYLENOL) 325 MG tablet Take 1-2 tablets (325-650 mg total) by mouth every 4 (four) hours as needed for mild pain.     aspirin 81 MG chewable tablet Chew 1 tablet (81 mg total) by mouth daily.     carvedilol (COREG) 6.25 MG tablet Take 1 tablet (6.25 mg total) by mouth 2 (two) times daily with a meal. 90 tablet 3   divalproex (DEPAKOTE) 500 MG DR tablet Take 1 tablet (500 mg total) by mouth 2 (two) times daily. 180 tablet 3   gabapentin (NEURONTIN) 300 MG capsule Take 1 capsule (300 mg total) by mouth 2 (two) times daily AND  2 capsules (600 mg total) at bedtime. 120 capsule 5   levETIRAcetam (KEPPRA) 1000 MG tablet Take 1 tablet (1,000 mg total) by mouth 2 (two) times daily. 180 tablet 3   LORazepam (ATIVAN) 0.5 MG tablet Take 0.5 mg by mouth at bedtime as needed for sedation.     Multiple Vitamins-Minerals (MULTIVITAMIN MEN PO) Take 1 tablet by mouth daily.     naloxone (NARCAN) nasal spray 4 mg/0.1 mL 1 spray once.     oxyCODONE-acetaminophen (PERCOCET) 10-325 MG tablet Take 1 tablet by mouth every 4 (four) hours as needed for pain. 75 tablet 0   pantoprazole sodium (PROTONIX) 40 mg/20 mL PACK Dissolve 1 packet in 20 mLs (40 mg total) and take by mouth daily. (Patient  taking differently: Take 40 mg by mouth daily as needed (acid reflux).) 30 mL 0   QUEtiapine (SEROQUEL) 50 MG tablet TAKE 1 TABLET BY MOUTH AT BEDTIME. AND 1/2 TABLET DURING THE DAY AS NEEDED FOR AGITATION (Patient taking differently: Take 50 mg by mouth at bedtime.) 135 tablet 0   rosuvastatin (CRESTOR) 20 MG tablet Take 1 tablet (20 mg total) by mouth daily. 90 tablet 1   tamsulosin (FLOMAX) 0.4 MG CAPS capsule TAKE 2 CAPSULES (0.8 MG TOTAL) BY MOUTH DAILY AFTER SUPPER. 180 capsule 1   amLODipine (NORVASC) 5 MG tablet Take 0.5 tablets (2.5 mg total) by mouth in the morning and at bedtime. 90 tablet 3   ferrous sulfate 325 (65 FE) MG tablet Take 1 tablet (325 mg total) by mouth daily. (Patient not taking: Reported on 04/27/2022) 30 tablet 3   losartan (COZAAR) 50 MG tablet Take 1 tablet (50 mg total) by mouth daily. 90 tablet 3   No facility-administered medications prior to visit.    No Known Allergies ROS neg/noncontributory except as noted HPI/below      Objective:     BP (!) 150/90   Pulse 74   Temp 99.1 F (37.3 C) (Temporal)   Ht 6\' 4"  (1.93 m)   Wt 145 lb (65.8 kg)   SpO2 99%   BMI 17.65 kg/m  Wt Readings from Last 3 Encounters:  04/27/22 145 lb (65.8 kg)  04/22/22 139 lb 3.2 oz (63.1 kg)  03/30/22 138 lb 4 oz (62.7 kg)    Physical Exam   Gen: WDWN NAD HEENT: NCAT, conjunctiva not injected, sclera nonicteric NECK:  supple, no thyromegaly, no nodes, no carotid bruits CARDIAC: RRR, S1S2+, no murmur. DP 2+B LUNGS: CTAB. No wheezes ABDOMEN:  BS+, soft, NTND, No HSM, no masses EXT:  no edema MSK: no gross abnormalities.  NEURO: A&O x3.  CN II-XII intact.  PSYCH: normal mood. 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  Hypertension-chronic.  Not controlled.  Per neuro, goal less than 130/90.  Will increase amlodipine to 10 mg daily.  Continue losartan 50 (may need to increase this dose as well) continue Coreg 6.25 mg twice daily.  Patient will return for labs.  Check CBC, CMP.  Monitor blood pressures at home and let us know.  Follow-up in 3 months. 2.  Seizure disorder-chronic.  Controlled on medications.  Continue.  Care per neuro.  Will return for labs-CBC, TSH, CMP 3.  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  Chronic.  He is aware that it he will not be "back to life as it was before the stroke".  Other than chronic pain, he seems to be doing very well.  Continue rosuvastatin 20 mg daily.  Aspirin 81 mg daily.  Need to get blood pressure under better control. 4.  Chronic pain syndrome-thought to be secondary to the CVA.  B12 has been normal.  Currently declining further workup.  He is taking oxycodone 10 mg every 3-6 hours.  I advised of the possibility of accidental overdose.  Needs to wait at least 4 hours before taking additional doses.  He does have a prescription for Narcan.  Referral in process for pain management as he would probably do better with some long-acting pain meds.  He is still working on getting his insurance straightened out, but they have all the contact information.  In the meantime, continue meds but try to stretch to every 6 hours.   Check UDS.  Will try to add 300 mg of gabapentin in the morning-if makes too drowsy, then just stick with 600 mg in the afternoon and 900 mg at at bedtime. 5.  Depression/anxiety-chronic.  Fairly well-controlled.  Continue Seroquel 50 mg daily. 6.  Chronic insomnia-he is not overly tired during the day.  Currently takes lorazepam 0.5 mg as needed at at bedtime.  Taking gabapentin 900 mg at at bedtime.  Can try to increase Seroquel to 75 mg at at bedtime to see if it helps better.  Follow-up 3 months  Meds ordered this encounter  Medications   amLODipine (NORVASC) 10 MG tablet    Sig: Take 1 tablet (10 mg total) by mouth daily.    Dispense:  90 tablet    Refill:  1    Wellington Hampshire, MD

## 2022-04-27 NOTE — Patient Instructions (Signed)
It was very nice to see you today!  Try gabapentin in am for at least 1wk  if ok, then try seroquel 1.5 tabs at bed.   PLEASE NOTE:  If you had any lab tests please let us know if you have not heard back within a few days. You may see your results on MyChart before we have a chance to review them but we will give you a call once they are reviewed by [place]. If we ordered any referrals today, please let us know if you have not heard from their office within the next week.   Please try these tips to maintain a healthy lifestyle:  Eat most of your calories during the day when you are active. Eliminate processed foods including packaged sweets (pies, cakes, cookies), reduce intake of potatoes, white bread, white pasta, and white rice. Look for whole grain options, oat flour or almond flour.  Each meal should contain half fruits/vegetables, one quarter protein, and one quarter carbs (no bigger than a computer mouse).  Cut down on sweet beverages. This includes juice, soda, and sweet tea. Also watch fruit intake, though this is a healthier sweet option, it still contains natural sugar! Limit to 3 servings daily.  Drink at least 1 glass of water with each meal and aim for at least 8 glasses per day  Exercise at least 150 minutes every week.

## 2022-04-29 ENCOUNTER — Telehealth: Payer: Self-pay | Admitting: Family Medicine

## 2022-04-29 MED ORDER — OXYCODONE-ACETAMINOPHEN 10-325 MG PO TABS: 1.0000 | ORAL_TABLET | ORAL | 0 refills | Status: DC | PRN

## 2022-05-03 ENCOUNTER — Other Ambulatory Visit: Payer: Medicaid Other

## 2022-05-03 NOTE — Telephone Encounter (Signed)
LMOVM informing patient that PA was approved

## 2022-05-03 NOTE — Telephone Encounter (Signed)
ID [id number] BIN [id number] PCN [employer] [id number]  PA submitted to CoverMyMeds 05/03/22

## 2022-05-03 NOTE — Telephone Encounter (Signed)
Patient would like to know if medication was called in - patient confused - called in on 1/4 however pharamcy asking for further authorization - patient would like a call back or to GINA spouse

## 2022-05-04 ENCOUNTER — Other Ambulatory Visit: Payer: Self-pay | Admitting: *Deleted

## 2022-05-04 ENCOUNTER — Other Ambulatory Visit (INDEPENDENT_AMBULATORY_CARE_PROVIDER_SITE_OTHER): Payer: Medicaid Other

## 2022-05-04 DIAGNOSIS — Z13220 Encounter for screening for lipoid disorders: Secondary | ICD-10-CM

## 2022-05-04 DIAGNOSIS — Z1159 Encounter for screening for other viral diseases: Secondary | ICD-10-CM

## 2022-05-04 DIAGNOSIS — Z79899 Other long term (current) drug therapy: Secondary | ICD-10-CM

## 2022-05-04 DIAGNOSIS — R569 Unspecified convulsions: Secondary | ICD-10-CM

## 2022-05-04 DIAGNOSIS — I10 Essential (primary) hypertension: Secondary | ICD-10-CM

## 2022-05-04 DIAGNOSIS — Z1329 Encounter for screening for other suspected endocrine disorder: Secondary | ICD-10-CM

## 2022-05-05 ENCOUNTER — Other Ambulatory Visit: Payer: Self-pay | Admitting: Family Medicine

## 2022-05-05 NOTE — Telephone Encounter (Signed)
Refused Rosuvastatin 20 mg because pt established with another provider at [name] at [facility] on 03/30/2022.

## 2022-05-08 LAB — DRUG MONITORING, PANEL 8 WITH CONFIRMATION, URINE
6 Acetylmorphine: NEGATIVE ng/mL (ref <10)
Alcohol Metabolites: NEGATIVE ng/mL (ref <500)
Alphahydroxyalprazolam: NEGATIVE ng/mL (ref <25)
Alphahydroxymidazolam: NEGATIVE ng/mL (ref <50)
Alphahydroxytriazolam: NEGATIVE ng/mL (ref <50)
Aminoclonazepam: NEGATIVE ng/mL (ref <25)
Amphetamines: NEGATIVE ng/mL (ref <500)
Benzodiazepines: POSITIVE ng/mL — AB (ref <100)
Buprenorphine, Urine: NEGATIVE ng/mL (ref <5)
Cocaine Metabolite: NEGATIVE ng/mL (ref <150)
Codeine: NEGATIVE ng/mL (ref <50)
Creatinine: 71.7 mg/dL (ref >=20.0)
Hydrocodone: NEGATIVE ng/mL (ref <50)
Hydromorphone: NEGATIVE ng/mL (ref <50)
Hydroxyethylflurazepam: NEGATIVE ng/mL (ref <50)
Lorazepam: 821 ng/mL — ABNORMAL HIGH (ref <50)
MDMA: NEGATIVE ng/mL (ref <500)
Marijuana Metabolite: NEGATIVE ng/mL (ref <20)
Morphine: NEGATIVE ng/mL (ref <50)
Nordiazepam: NEGATIVE ng/mL (ref <50)
Norhydrocodone: NEGATIVE ng/mL (ref <50)
Noroxycodone: 2014 ng/mL — ABNORMAL HIGH (ref <50)
Opiates: NEGATIVE ng/mL (ref <100)
Oxazepam: NEGATIVE ng/mL (ref <50)
Oxidant: NEGATIVE ug/mL (ref <200)
Oxycodone: 401 ng/mL — ABNORMAL HIGH (ref <50)
Oxycodone: POSITIVE ng/mL — AB (ref <100)
Oxymorphone: 2114 ng/mL — ABNORMAL HIGH (ref <50)
Temazepam: NEGATIVE ng/mL (ref <50)
pH: 5.9 (ref 4.5–9.0)

## 2022-05-08 LAB — DM TEMPLATE

## 2022-05-08 LAB — HEPATITIS C ANTIBODY

## 2022-05-31 ENCOUNTER — Encounter: Payer: Self-pay | Admitting: Family Medicine

## 2022-05-31 ENCOUNTER — Other Ambulatory Visit: Payer: Self-pay | Admitting: Family Medicine

## 2022-05-31 MED ORDER — OXYCODONE-ACETAMINOPHEN 10-325 MG PO TABS: 1.0000 | ORAL_TABLET | ORAL | 0 refills | Status: DC | PRN

## 2022-06-02 ENCOUNTER — Other Ambulatory Visit: Payer: Self-pay | Admitting: Internal Medicine

## 2022-06-03 ENCOUNTER — Other Ambulatory Visit: Payer: Self-pay | Admitting: Family Medicine

## 2022-06-03 ENCOUNTER — Telehealth: Payer: Self-pay | Admitting: *Deleted

## 2022-06-03 MED ORDER — LORAZEPAM 0.5 MG PO TABS: 0.5000 mg | ORAL_TABLET | Freq: Every evening | ORAL | 0 refills | Status: DC | PRN

## 2022-06-03 NOTE — Telephone Encounter (Signed)
Pharmacy faxed over refill request for Lorazepam 0.5 mg tablet.

## 2022-06-03 NOTE — Progress Notes (Signed)
Got 90 loraz on 05/05/22

## 2022-06-03 NOTE — Telephone Encounter (Signed)
Patient taking 1 nightly and if having a bad day, will take on then. Patient is trying to get all Rx's ordered through Dr. Cherlynn Kaiser.

## 2022-06-07 ENCOUNTER — Other Ambulatory Visit: Payer: Self-pay | Admitting: Family Medicine

## 2022-06-07 ENCOUNTER — Telehealth: Payer: Self-pay | Admitting: Family Medicine

## 2022-06-07 MED ORDER — CARVEDILOL 6.25 MG PO TABS: 6.2500 mg | ORAL_TABLET | Freq: Two times a day (BID) | ORAL | 3 refills | Status: DC

## 2022-06-07 NOTE — Telephone Encounter (Signed)
Pt was out of medication. Pt was advised to go to UC or see provider within 4 hrs  Patient Name: Jesse [name] Gender: Male DOB: [birth year] Age: 47 Y 3 M 17 D Return Phone Number: [phone] (Primary) Address: City/ State/ Zip: [place]  [address] Client [facility] [place] Client Site [place] [facility] [employer] Type Call Who Is Calling Patient / Member / Family / Caregiver Call Type Triage / Clinical Caller Name Decklin Pointer Relationship To Patient Spouse Return Phone Number [phone] (Primary) Chief Complaint Fatigue (greater than THREE MONTHS old) Reason for Call Symptomatic / Request for [place] states her husband is needing a refill on his medication. c/o fatigue Additional Comment Cherlynn Kaiser - Doctor Not listed - Confirmed office location Translation No Nurse Assessment Nurse: Marcello Moores, RN, Noah Delaine Date/Time Eilene Ghazi Time): 06/04/2022 6:28:03 PM Confirm and document reason for call. If symptomatic, describe symptoms. ---Caller states her husband is needing a refill on his medication. c/o fatigue. Carvedilol 6.25 Does the patient have any new or worsening symptoms? ---Yes Will a triage be completed? ---Yes Related visit to physician within the last 2 weeks? ---No Does the PT have any chronic conditions? (i.e. diabetes, asthma, this includes High risk factors for pregnancy, etc.) ---Yes List chronic conditions. ---HTN, history of stroke Is this a behavioral health or substance abuse call? ---No Nurse: Marcello Moores, RN, Noah Delaine Date/Time (Eastern Time): 06/04/2022 6:42:00 PM Please select the assessment type ---Refill Additional Documentation ---Carvedilol 6.25 mg 1 tab PO BID with meals, last prescribed by Cardiologist Does the patient have enough medication to last until the office opens? ---Unable to obtain loaner dose from Pharmacy Does the client directives allow for  assistance with medications after hours? ---No Guidelines Guideline Title Affirmed Question Affirmed Notes Nurse Date/Time Eilene Ghazi Time) Weakness (Generalized) and Fatigue [1] MODERATE weakness (i.e., interferes with work, school, normal activities) AND [2] persists > 3 days Lorre Munroe 06/04/2022 6:31:47 PM Disp. Time Eilene Ghazi Time) Disposition Final User 06/04/2022 6:42:18 PM See HCP within 4 Hours (or PCP triage) Yes Marcello Moores, RN, Noah Delaine Final Disposition 06/04/2022 6:42:18 PM See HCP within 4 Hours (or PCP triage) Yes Marcello Moores, RN, Noah Delaine Disposition Overriden: See PCP within 24 Hours Override Reason: Patient's symptoms need a higher level of care Caller Disagree/Comply Comply Caller Understands Yes PreDisposition Call Doctor Care Advice Given Per Guideline * IF NO PCP (PRIMARY CARE PROVIDER) SECOND-LEVEL TRIAGE: You need to be seen within the next hour. Go to the New Site at _____________ [place] as soon as you can. SEE HCP (OR PCP TRIAGE) WITHIN 4 HOURS: * IF OFFICE WILL BE CLOSED AND NO PCP (PRIMARY CARE PROVIDER) SECOND-LEVEL TRIAGE: You need to be seen within the next 3 or 4 hours. A nearby Urgent Care Center [facility]) is often a good source of care. Another choice is to go to the ED. Go sooner if you become worse. CALL BACK IF: * You become worse Comments User: Creed Copper, RN Date/Time Eilene Ghazi Time): 06/04/2022 6:43:01 PM Patient upgraded due to length of patient's symptoms and out of medications. Referrals [facility] at [facility] [place]

## 2022-06-07 NOTE — Telephone Encounter (Signed)
Noted  

## 2022-06-07 NOTE — Telephone Encounter (Signed)
  Encourage patient to contact the pharmacy for refills or they can request refills through [facility]:  04/27/22  NEXT APPOINTMENT DATE: 07/27/22  MEDICATION:  carvedilol (COREG) 6.25 MG tablet     Is the patient out of medication? yes  PHARMACY:  [id number] - [place], [place] [address] [place] Phone: [phone]  Fax: [phone]      Let patient know to contact pharmacy at the end of the day to make sure medication is ready.  Please notify patient to allow 48-72 hours to process

## 2022-06-07 NOTE — Telephone Encounter (Signed)
Please see message below and advise on refill.

## 2022-06-25 ENCOUNTER — Other Ambulatory Visit: Payer: Self-pay | Admitting: Family Medicine

## 2022-06-28 ENCOUNTER — Other Ambulatory Visit: Payer: Self-pay | Admitting: Family Medicine

## 2022-06-28 MED ORDER — OXYCODONE-ACETAMINOPHEN 10-325 MG PO TABS: 1.0000 | ORAL_TABLET | ORAL | 0 refills | Status: DC | PRN

## 2022-06-28 NOTE — Telephone Encounter (Signed)
Last refill: 05/31/22 #150, 0 Last OV: 04/27/22 dx. HTN,

## 2022-06-29 ENCOUNTER — Other Ambulatory Visit: Payer: Self-pay | Admitting: Family Medicine

## 2022-06-29 ENCOUNTER — Telehealth: Payer: Self-pay | Admitting: Family Medicine

## 2022-06-29 MED ORDER — OXYCODONE-ACETAMINOPHEN 10-325 MG PO TABS: 1.0000 | ORAL_TABLET | ORAL | 0 refills | Status: DC | PRN

## 2022-06-29 NOTE — Progress Notes (Signed)
Sent to different pharm

## 2022-06-29 NOTE — Telephone Encounter (Signed)
Current pharmacy out of stock oxyCODONE-acetaminophen (PERCOCET) 10-325 MG tablet   [facility] [address] has medication has enough medication. Patient would like Rx to be transferred to this pharmacy.

## 2022-07-04 ENCOUNTER — Other Ambulatory Visit: Payer: Self-pay | Admitting: Physical Medicine & Rehabilitation

## 2022-07-04 ENCOUNTER — Other Ambulatory Visit: Payer: Self-pay | Admitting: Family Medicine

## 2022-07-05 ENCOUNTER — Other Ambulatory Visit: Payer: Self-pay | Admitting: Thoracic Surgery (Cardiothoracic Vascular Surgery)

## 2022-07-05 DIAGNOSIS — Z9889 Other specified postprocedural states: Secondary | ICD-10-CM

## 2022-07-05 MED ORDER — LORAZEPAM 0.5 MG PO TABS: 0.5000 mg | ORAL_TABLET | Freq: Every evening | ORAL | 0 refills | Status: DC | PRN

## 2022-07-05 NOTE — Telephone Encounter (Signed)
Last refill: 06/03/22 #30, 0 Last OV: 04/27/22 dx. HTN, anxiety and depression

## 2022-07-11 ENCOUNTER — Other Ambulatory Visit: Payer: Self-pay | Admitting: Family Medicine

## 2022-07-12 ENCOUNTER — Encounter: Payer: Self-pay | Admitting: Family Medicine

## 2022-07-13 NOTE — Telephone Encounter (Signed)
Wife notified on yesterday via mychart.

## 2022-07-22 ENCOUNTER — Other Ambulatory Visit: Payer: Self-pay | Admitting: Physical Medicine & Rehabilitation

## 2022-07-26 ENCOUNTER — Telehealth: Payer: Self-pay | Admitting: *Deleted

## 2022-07-26 NOTE — Telephone Encounter (Signed)
PA Case: [id number], Status: Approved, Coverage Starts on: 07/26/2022 12:00:00 AM, Coverage Ends on: 07/26/2023 12:00:00 AM.  File

## 2022-07-27 ENCOUNTER — Other Ambulatory Visit: Payer: Self-pay | Admitting: Family Medicine

## 2022-07-27 ENCOUNTER — Ambulatory Visit: Payer: Medicaid Other | Admitting: Family Medicine

## 2022-07-27 MED ORDER — OXYCODONE-ACETAMINOPHEN 10-325 MG PO TABS: 1.0000 | ORAL_TABLET | ORAL | 0 refills | Status: DC | PRN

## 2022-08-03 ENCOUNTER — Ambulatory Visit: Payer: Medicaid Other | Admitting: Family Medicine

## 2022-08-19 ENCOUNTER — Other Ambulatory Visit: Payer: Medicaid Other

## 2022-08-23 ENCOUNTER — Other Ambulatory Visit: Payer: Self-pay | Admitting: Family Medicine

## 2022-08-23 ENCOUNTER — Encounter: Payer: Self-pay | Admitting: *Deleted

## 2022-08-23 MED ORDER — OXYCODONE-ACETAMINOPHEN 10-325 MG PO TABS: 1.0000 | ORAL_TABLET | ORAL | 0 refills | Status: DC | PRN

## 2022-08-23 NOTE — Telephone Encounter (Signed)
Mychart message sent informing patient

## 2022-08-24 ENCOUNTER — Ambulatory Visit: Payer: Medicaid Other | Admitting: Thoracic Surgery (Cardiothoracic Vascular Surgery)

## 2022-08-30 ENCOUNTER — Other Ambulatory Visit: Payer: Self-pay | Admitting: Family Medicine

## 2022-08-30 MED ORDER — LORAZEPAM 0.5 MG PO TABS: 0.5000 mg | ORAL_TABLET | Freq: Every evening | ORAL | 0 refills | Status: DC | PRN

## 2022-08-30 NOTE — Telephone Encounter (Signed)
Last refill: 07/05/22 #30, 0 Last OV: 04/27/22 dx. HTN, anxiety and depression

## 2022-09-02 ENCOUNTER — Other Ambulatory Visit: Payer: Self-pay | Admitting: Family Medicine

## 2022-09-02 ENCOUNTER — Encounter: Payer: Self-pay | Admitting: Adult Health

## 2022-09-02 MED ORDER — OXYCODONE-ACETAMINOPHEN 10-325 MG PO TABS: 1.0000 | ORAL_TABLET | ORAL | 0 refills | Status: DC | PRN

## 2022-09-02 NOTE — Telephone Encounter (Signed)
Caller states she accidentally threw bag away that had patient's medication on sunday. States didn't realize this until son told her today when patient requested a dosage. Caller is requesting an emergency refill if possible.

## 2022-09-03 NOTE — Telephone Encounter (Signed)
Last refill: #150, 0 Last OV: 04/27/22 dx. HTN, anxiety/depression

## 2022-09-09 ENCOUNTER — Ambulatory Visit: Payer: Medicaid Other | Admitting: Family Medicine

## 2022-09-09 VITALS — BP 130/80 | HR 61 | Temp 98.7°F | Resp 16 | Ht 76.0 in | Wt 140.4 lb

## 2022-09-09 DIAGNOSIS — R569 Unspecified convulsions: Secondary | ICD-10-CM | POA: Diagnosis not present

## 2022-09-09 DIAGNOSIS — I10 Essential (primary) hypertension: Secondary | ICD-10-CM

## 2022-09-09 DIAGNOSIS — R208 Other disturbances of skin sensation: Secondary | ICD-10-CM

## 2022-09-09 DIAGNOSIS — F419 Anxiety disorder, unspecified: Secondary | ICD-10-CM

## 2022-09-09 DIAGNOSIS — Z8673 Personal history of transient ischemic attack (TIA), and cerebral infarction without residual deficits: Secondary | ICD-10-CM

## 2022-09-09 DIAGNOSIS — Z1329 Encounter for screening for other suspected endocrine disorder: Secondary | ICD-10-CM

## 2022-09-09 DIAGNOSIS — Z13220 Encounter for screening for lipoid disorders: Secondary | ICD-10-CM

## 2022-09-09 DIAGNOSIS — G4709 Other insomnia: Secondary | ICD-10-CM | POA: Diagnosis not present

## 2022-09-09 DIAGNOSIS — Z79899 Other long term (current) drug therapy: Secondary | ICD-10-CM

## 2022-09-09 DIAGNOSIS — Z1159 Encounter for screening for other viral diseases: Secondary | ICD-10-CM | POA: Diagnosis not present

## 2022-09-09 DIAGNOSIS — F32A Depression, unspecified: Secondary | ICD-10-CM | POA: Diagnosis not present

## 2022-09-09 LAB — COMPREHENSIVE METABOLIC PANEL
ALT: 10 U/L (ref 0–53)
AST: 17 U/L (ref 0–37)
Albumin: 4.2 g/dL (ref 3.5–5.2)
Alkaline Phosphatase: 42 U/L (ref 39–117)
BUN: 12 mg/dL (ref 6–23)
CO2: 29 mEq/L (ref 19–32)
Calcium: 9.5 mg/dL (ref 8.4–10.5)
Chloride: 96 mEq/L (ref 96–112)
Creatinine, Ser: 1.26 mg/dL (ref 0.40–1.50)
GFR: 67.42 mL/min (ref >60.00)
Glucose, Bld: 98 mg/dL (ref 70–99)
Potassium: 4.8 mEq/L (ref 3.5–5.1)
Sodium: 134 mEq/L — ABNORMAL LOW (ref 135–145)
Total Bilirubin: 0.5 mg/dL (ref 0.2–1.2)
Total Protein: 6.7 g/dL (ref 6.0–8.3)

## 2022-09-09 LAB — CBC WITH DIFFERENTIAL/PLATELET
Basophils Absolute: 0 10*3/uL (ref 0.0–0.1)
Basophils Relative: 0.6 % (ref 0.0–3.0)
Eosinophils Absolute: 0.1 10*3/uL (ref 0.0–0.7)
Eosinophils Relative: 1.9 % (ref 0.0–5.0)
HCT: 42.8 % (ref 39.0–52.0)
Hemoglobin: 14.8 g/dL (ref 13.0–17.0)
Lymphocytes Relative: 25.5 % (ref 12.0–46.0)
Lymphs Abs: 1.8 10*3/uL (ref 0.7–4.0)
MCHC: 34.7 g/dL (ref 30.0–36.0)
MCV: 99.7 fl (ref 78.0–100.0)
Monocytes Absolute: 0.4 10*3/uL (ref 0.1–1.0)
Monocytes Relative: 5.1 % (ref 3.0–12.0)
Neutro Abs: 4.7 10*3/uL (ref 1.4–7.7)
Neutrophils Relative %: 66.9 % (ref 43.0–77.0)
Platelets: 176 10*3/uL (ref 150.0–400.0)
RBC: 4.29 Mil/uL (ref 4.22–5.81)
RDW: 13.5 % (ref 11.5–15.5)
WBC: 7.1 10*3/uL (ref 4.0–10.5)

## 2022-09-09 LAB — LIPID PANEL
Cholesterol: 125 mg/dL (ref 0–200)
HDL: 39 mg/dL — ABNORMAL LOW (ref >39.00)
LDL Cholesterol: 61 mg/dL (ref 0–99)
NonHDL: 86.36
Total CHOL/HDL Ratio: 3
Triglycerides: 129 mg/dL (ref 0.0–149.0)
VLDL: 25.8 mg/dL (ref 0.0–40.0)

## 2022-09-09 LAB — VITAMIN B12: Vitamin B-12: 802 pg/mL (ref 211–911)

## 2022-09-09 LAB — TSH: TSH: 2.1 u[IU]/mL (ref 0.35–5.50)

## 2022-09-09 MED ORDER — HYDROXYZINE PAMOATE 25 MG PO CAPS: 25.0000 mg | ORAL_CAPSULE | Freq: Three times a day (TID) | ORAL | 2 refills | Status: DC | PRN

## 2022-09-09 NOTE — Patient Instructions (Addendum)
It was very nice to see you today!  Think about daily medications for anxiety/OCD(SSRI-Paxil, Zoloft,Lexapro)  Do the hydroxyzine as needed for anxiety   PLEASE NOTE:  If you had any lab tests please let us know if you have not heard back within a few days. You may see your results on MyChart before we have a chance to review them but we will give you a call once they are reviewed by [place]. If we ordered any referrals today, please let us know if you have not heard from their office within the next week.   Please try these tips to maintain a healthy lifestyle:  Eat most of your calories during the day when you are active. Eliminate processed foods including packaged sweets (pies, cakes, cookies), reduce intake of potatoes, white bread, white pasta, and white rice. Look for whole grain options, oat flour or almond flour.  Each meal should contain half fruits/vegetables, one quarter protein, and one quarter carbs (no bigger than a computer mouse).  Cut down on sweet beverages. This includes juice, soda, and sweet tea. Also watch fruit intake, though this is a healthier sweet option, it still contains natural sugar! Limit to 3 servings daily.  Drink at least 1 glass of water with each meal and aim for at least 8 glasses per day  Exercise at least 150 minutes every week.  -SSRI(

## 2022-09-09 NOTE — Progress Notes (Signed)
Subjective:     Patient ID: Jesse [name], male    DOB: 1976/01/30, 47 y.o.   MRN: [id number]  Chief Complaint  Patient presents with   Medical Management of Chronic Issues    3 month follow-up on HTN Need to do blood work that was ordered from last visit    HPI-here w/wife 1  seizure(s)-no break through  Depakote 500 twice daily, keppra 1000bid  taking medications regularly   seeing neurology 2  history of cerebrovascular accident-no new symptoms  on ASA, plavix chronically, pain in feet and lower legs   some numbness right>left   can be hypersens to pain/touch.  Neurology said from cerebrovascular accident. Taking oxy every 3-5 hrs. During day.  Not at [facility]  Still doing yard work, etc   gabapentin 33 mg twice daily and 600mg  hs 3  HTN-Pt is on amlodipine 10 mg, coreg 6.25 twice daily,  .  Bp's running       .  No ha/dizziness/cp/palp/edema/cough/sob  4.  Insomnia/anxiety-seroquel 50 at night-time helps and lorazepam 0.5 mg.  Stresses about possibility of no sleep   no SI 5  anxiety-constant.   lorazepam nightly, occasional daytime   no medications otherwise  occasional depression, occasional OCD    There are no preventive care reminders to display for this patient.  Past Medical History:  Diagnosis Date   Anxiety    Arthritis 10 years ago   Notice him having pains in joints- knees / back   Blood transfusion without reported diagnosis 11-04-20 ?   Possible during surgery/ hospital stay   CHF (congestive heart failure) (HCC) 11-04-20   Hospital stay   Depression    Essential hypertension    GERD (gastroesophageal reflux disease) 5 years ago   Started complaining about spicy food more- says hurt chest after eating   Heart murmur 11-04-20 ?   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 WITH REPLACEMENT OF ASCENDING AORTA AND HEMIARCH USING HEMASHIELD PLATINUM GRAFT AND HEMASHIELD GOLD 14 X GRAFT, RESUSPENSION OF NATIVE VALVE, AORTA TO RIGHT CAROTID BYPASS, AORTA TO RIGHT SUBCLAVIAN BYPASS;  Surgeon: Purcell Nails, MD;  Location: [facility] OR;  Service: Open Heart Surgery;  Laterality: N/A;   CARDIAC VALVE REPLACEMENT  11-04-20/11-05-20   Stroke sent him to hospital- had heart procedure   TEE WITHOUT CARDIOVERSION N/A 11/04/2020   Procedure: TRANSESOPHAGEAL ECHOCARDIOGRAM (TEE);  Surgeon: Purcell Nails, MD;  Location: [facility] OR;  Service: Open Heart Surgery;  Laterality: N/A;     Current Outpatient Medications:    acetaminophen (TYLENOL) 325 MG tablet, Take 1-2 tablets (325-650 mg total) by mouth every 4 (four) hours as needed for mild pain., Disp: , Rfl:    amLODipine (NORVASC) 10 MG tablet, Take 1 tablet (10 mg total) by mouth daily., Disp: 90  tablet, Rfl: 1   aspirin 81 MG chewable tablet, Chew 1 tablet (81 mg total) by mouth daily., Disp: , Rfl:    carvedilol (COREG) 6.25 MG tablet, Take 1 tablet (6.25 mg total) by mouth 2 (two) times daily with a meal., Disp: 90 tablet, Rfl: 3   divalproex (DEPAKOTE) 500 MG DR tablet, Take 1 tablet (500 mg total) by mouth 2 (two) times daily., Disp: 180 tablet, Rfl: 3   gabapentin (NEURONTIN) 300 MG capsule, Take 1 capsule (300 mg total) by mouth 2 (two) times daily AND 2 capsules (600 mg total) at bedtime., Disp: 120 capsule, Rfl: 5   hydrOXYzine (VISTARIL) 25  MG capsule, Take 1 capsule (25 mg total) by mouth every 8 (eight) hours as needed., Disp: 30 capsule, Rfl: 2   levETIRAcetam (KEPPRA) 1000 MG tablet, Take 1 tablet (1,000 mg total) by mouth 2 (two) times daily., Disp: 180 tablet, Rfl: 3   LORazepam (ATIVAN) 0.5 MG tablet, Take 1 tablet (0.5 mg total) by mouth at bedtime as needed for sedation., Disp: 30 tablet, Rfl: 0   Multiple Vitamins-Minerals (MULTIVITAMIN MEN PO), Take 1 tablet by mouth daily., Disp: , Rfl:    naloxone (NARCAN) nasal spray 4 mg/0.1 mL, 1 spray once., Disp: , Rfl:    oxyCODONE-acetaminophen (PERCOCET) 10-325 MG tablet, Take 1 tablet by mouth every 4 (four) hours as needed for pain. This is a 30 day suppy., Disp: 150 tablet, Rfl: 0   pantoprazole sodium (PROTONIX) 40 mg/20 mL PACK, Dissolve 1 packet in 20 mLs (40 mg total) and take by mouth daily. (Patient taking differently: Take 40 mg by mouth daily as needed (acid reflux).), Disp: 30 mL, Rfl: 0   QUEtiapine (SEROQUEL) 50 MG tablet, Take 1 tablet (50 mg total) by mouth at bedtime., Disp: 30 tablet, Rfl: 4   rosuvastatin (CRESTOR) 20 MG tablet, TAKE 1 TABLET BY MOUTH EVERY DAY, Disp: 90 tablet, Rfl: 1   tamsulosin (FLOMAX) 0.4 MG CAPS capsule, TAKE 2 CAPSULES (0.8 MG TOTAL) BY MOUTH DAILY AFTER SUPPER., Disp: 180 capsule, Rfl: 1   losartan (COZAAR) 50 MG tablet, Take 1 tablet (50 mg total) by mouth daily., Disp: 90 tablet, Rfl: 3  No Known Allergies ROS neg/noncontributory except as noted HPI/below      Objective:     BP 130/80   Pulse 61   Temp 98.7 F (37.1 C) (Temporal)   Resp 16   Ht 6\' 4"  (1.93 m)   Wt 140 lb 6 oz (63.7 kg)   SpO2 99%   BMI 17.09 kg/m  Wt Readings from Last 3 Encounters:  09/09/22 140 lb 6 oz (63.7 kg)  04/27/22 145 lb (65.8 kg)  04/22/22 139 lb 3.2 oz (63.1 kg)    Physical Exam   Gen: WDWN NAD thin wm HEENT: NCAT, conjunctiva not injected, sclera nonicteric NECK:  supple, no thyromegaly, no nodes, no carotid bruits CARDIAC: RRR,  S1S2+, no murmur. DP 2+B LUNGS: CTAB. No wheezes ABDOMEN:  BS+, soft, NTND, No HSM, no masses EXT:  no edema MSK: no gross abnormalities.  NEURO: A&O x3.  CN II-XII intact.  PSYCH: normal mood. Good eye contact     Assessment & Plan:  Essential hypertension Assessment & Plan: Chronic.  Controlled.  Continue amlodipine 10 mg daily and carvedilol 6.25 mg twice daily  Orders: -     Comprehensive metabolic panel -     CBC with Differential/Platelet  History of CVA (cerebrovascular accident) Assessment & Plan: Chronic.  Continue aspirin 81  mg, Crestor 20 mg daily.  Still has some mild dysarthria, mild gait instability.  Chronic dysesthesia.  Becoming more functional/independent   Seizure [facility]) Assessment & Plan: Chronic.  Thought to be from stroke.  Stable/controlled.  Continue Depakote 500 mg twice daily and Keppra 1000 mg twice daily.  Managed by neurology.   Dysesthesia of multiple sites Assessment & Plan: Chronic.  Thought to be mostly due to residual from stroke.  He is taking Percocet 10/325 about every 4 hours while awake.  He does this so he can be active and not sitting in the chair.  Aware not to overuse this medicine.  Some days, does not take as much.  Continue to monitor.   Anxiety and depression Assessment & Plan: Chronic.  Also having some OCD and occasional depression.  Currently taking lorazepam 0.5 mg occasionally during the day.  He has been advised against this as he is on narcotics.  Will trial hydroxyzine 25 mg every 8 hours as needed for his anxiety.  Also, discussed SSRIs.  They will consider this approach.  Want to start with the hydroxyzine.   Other insomnia Assessment & Plan: Chronic.  Better controlled.  Continue Seroquel 50 mg nightly and lorazepam 0.5 mg nightly (ideally goal is to get him off of this) we will try hydroxyzine 25 mg at at bedtime as well.   High risk medication use -     Vitamin B12 -     Iron, TIBC and Ferritin Panel  Encounter  for screening for lipid disorder -     Lipid panel  Encounter for hepatitis C screening test for low risk patient -     Hepatitis C antibody  Screening for thyroid disorder -     TSH  Other orders -     hydrOXYzine Pamoate; Take 1 capsule (25 mg total) by mouth every 8 (eight) hours as needed.  Dispense: 30 capsule; Refill: 2  Follow up 22m  Angelena Sole, MD

## 2022-09-10 ENCOUNTER — Other Ambulatory Visit: Payer: Self-pay | Admitting: Family Medicine

## 2022-09-10 DIAGNOSIS — Z8673 Personal history of transient ischemic attack (TIA), and cerebral infarction without residual deficits: Secondary | ICD-10-CM | POA: Insufficient documentation

## 2022-09-10 LAB — IRON,TIBC AND FERRITIN PANEL
%SAT: 37 % (calc) (ref 20–48)
Ferritin: 85 ng/mL (ref 38–380)
Iron: 106 ug/dL (ref 50–180)
TIBC: 285 mcg/dL (calc) (ref 250–425)

## 2022-09-10 NOTE — Assessment & Plan Note (Signed)
Chronic.  Controlled.  Continue amlodipine 10 mg daily and carvedilol 6.25 mg twice daily

## 2022-09-10 NOTE — Assessment & Plan Note (Signed)
Chronic.  Thought to be mostly due to residual from stroke.  He is taking Percocet 10/325 about every 4 hours while awake.  He does this so he can be active and not sitting in the chair.  Aware not to overuse this medicine.  Some days, does not take as much.  Continue to monitor.

## 2022-09-10 NOTE — Assessment & Plan Note (Signed)
Chronic.  Better controlled.  Continue Seroquel 50 mg nightly and lorazepam 0.5 mg nightly (ideally goal is to get him off of this) we will try hydroxyzine 25 mg at at bedtime as well.

## 2022-09-10 NOTE — Assessment & Plan Note (Signed)
Chronic.  Continue aspirin 81 mg, Crestor 20 mg daily.  Still has some mild dysarthria, mild gait instability.  Chronic dysesthesia.  Becoming more functional/independent

## 2022-09-10 NOTE — Assessment & Plan Note (Signed)
Chronic.  Also having some OCD and occasional depression.  Currently taking lorazepam 0.5 mg occasionally during the day.  He has been advised against this as he is on narcotics.  Will trial hydroxyzine 25 mg every 8 hours as needed for his anxiety.  Also, discussed SSRIs.  They will consider this approach.  Want to start with the hydroxyzine.

## 2022-09-10 NOTE — Assessment & Plan Note (Signed)
Chronic.  Thought to be from stroke.  Stable/controlled.  Continue Depakote 500 mg twice daily and Keppra 1000 mg twice daily.  Managed by neurology.

## 2022-09-13 NOTE — Progress Notes (Signed)
Looks good.  Hepatitis C not done, but ok

## 2022-09-15 LAB — HEPATITIS C ANTIBODY: Hepatitis C Ab: NONREACTIVE

## 2022-09-25 ENCOUNTER — Other Ambulatory Visit: Payer: Self-pay | Admitting: Family Medicine

## 2022-09-25 ENCOUNTER — Other Ambulatory Visit: Payer: Self-pay | Admitting: Physical Medicine & Rehabilitation

## 2022-09-27 ENCOUNTER — Encounter: Payer: Self-pay | Admitting: Adult Health

## 2022-09-27 MED ORDER — LORAZEPAM 0.5 MG PO TABS: 0.5000 mg | ORAL_TABLET | Freq: Every evening | ORAL | 2 refills | Status: DC | PRN

## 2022-09-27 NOTE — Telephone Encounter (Signed)
As work in, in [place] and Dr Pearlean Brownie absence, are you willing to provide letter? We previously provided pt wife a letter to be excused from court 12/15/21 due to her caring for Jesse [name] and he cannot be aloe.

## 2022-09-27 NOTE — Telephone Encounter (Signed)
Last refill: 08/30/22 #30, 0 Last OV: 09/08/21 dx. HTN, insomnia, anxiety & depression

## 2022-09-30 ENCOUNTER — Other Ambulatory Visit: Payer: Self-pay | Admitting: Family Medicine

## 2022-09-30 MED ORDER — OXYCODONE-ACETAMINOPHEN 10-325 MG PO TABS: 1.0000 | ORAL_TABLET | ORAL | 0 refills | Status: DC | PRN

## 2022-09-30 NOTE — Telephone Encounter (Signed)
Last refill: 09/02/22 #150, 0 Last OV: 09/09/22 dx. HTN, anxiety and depression, high risk medication use

## 2022-10-25 ENCOUNTER — Other Ambulatory Visit: Payer: Self-pay | Admitting: Family Medicine

## 2022-10-25 ENCOUNTER — Encounter: Payer: Self-pay | Admitting: Family Medicine

## 2022-10-25 MED ORDER — OXYCODONE-ACETAMINOPHEN 10-325 MG PO TABS: 1.0000 | ORAL_TABLET | ORAL | 0 refills | Status: DC | PRN

## 2022-11-01 ENCOUNTER — Other Ambulatory Visit: Payer: Self-pay | Admitting: Adult Health

## 2022-11-01 DIAGNOSIS — R208 Other disturbances of skin sensation: Secondary | ICD-10-CM

## 2022-11-14 ENCOUNTER — Encounter: Payer: Self-pay | Admitting: Adult Health

## 2022-11-15 ENCOUNTER — Ambulatory Visit: Payer: Medicaid Other | Admitting: Adult Health

## 2022-11-17 ENCOUNTER — Other Ambulatory Visit: Payer: Self-pay | Admitting: Physical Medicine & Rehabilitation

## 2022-11-22 ENCOUNTER — Other Ambulatory Visit: Payer: Self-pay | Admitting: Physical Medicine & Rehabilitation

## 2022-11-24 ENCOUNTER — Encounter (INDEPENDENT_AMBULATORY_CARE_PROVIDER_SITE_OTHER): Payer: Self-pay

## 2022-11-25 ENCOUNTER — Other Ambulatory Visit: Payer: Self-pay | Admitting: Family Medicine

## 2022-11-25 MED ORDER — OXYCODONE-ACETAMINOPHEN 10-325 MG PO TABS: 1.0000 | ORAL_TABLET | ORAL | 0 refills | Status: DC | PRN

## 2022-11-26 ENCOUNTER — Encounter: Payer: Self-pay | Admitting: Family Medicine

## 2022-11-26 ENCOUNTER — Telehealth: Payer: Self-pay | Admitting: Family Medicine

## 2022-11-26 ENCOUNTER — Other Ambulatory Visit (HOSPITAL_COMMUNITY): Payer: Self-pay

## 2022-11-26 NOTE — Telephone Encounter (Signed)
Initiated PA via CMM. Key: [id number].  Please advise correct diagnosis code for PA.

## 2022-11-26 NOTE — Telephone Encounter (Signed)
Pt states pharmacy needs a PA for this medication. Please advise.

## 2022-11-26 NOTE — Telephone Encounter (Signed)
Dyesthesia of multiple sites, R20.8 - the Oxycodone was just filled by his PCP a month ago, on this chronically. I was just sending refill until his PCP returned, please send any future messages to Dr Ruthine Dose, thank you!

## 2022-11-26 NOTE — Telephone Encounter (Signed)
RX has already been ordered & received by pharmacy.  Patient Name First: Jesse Last: [name] Gender: Male DOB: 01-12-1976 Age: 47 Y 9 M 9 D Return Phone Number: [phone] (Primary) Address: City/ State/ Zip: [place]  [address] Client [facility] - Human resources officer [facility] [employer] Provider Ruthine Dose, Dewayne Hatch Contact Type Call Who Is Calling Patient / Member / Family / Caregiver Call Type Triage / Clinical Caller Name Cedrik Heindl Relationship To Patient Spouse Return Phone Number [phone] (Primary) Chief Complaint Prescription Refill or Medication Request (non symptomatic) Reason for Call Medication Question / Request Initial Comment Caller states her husband is a pt she has been trying to find out if there was a prescription called in. It is a refill. Translation No Nurse Assessment Nurse: Naval architect, RN, Kristacious Date/Time (Eastern Time): 11/25/2022 7:16:36 PM Confirm and document reason for call. If symptomatic, describe symptoms. ---Pt states out of oxycodone and RX not at pharmacy last they checked. Does the patient have any new or worsening symptoms? ---No Nurse: Naval architect, RN, Kristacious Date/Time (Eastern Time): 11/25/2022 7:17:35 PM Please select the assessment type ---Request for controlled medication refill Additional Documentation [facility] [place] Is there an on-call physician for the client? ---Yes Do the client directives specifically allow for paging the on-call regarding scheduled drugs? ---No Additional Documentation ---Pt verbalized understanding and will call office tomorrow for refill. Advised pt to call back with any new or worsening symptoms. Disp. Time Lamount Cohen Time) Disposition Final User 11/25/2022 7:02:29 PM Send To RN Personal Lawerance Bach, RN, Synetta Fail 11/25/2022 7:22:10 PM Clinical Call Yes Slatcher, RN, Kristacious Final Disposition 11/25/2022 7:22:10 PM Clinical Call Yes  Slatcher, RN, Kristacious Caller Disagree/Comply Comply Caller Understands Yes PreDisposition Did not know what to do

## 2022-11-29 ENCOUNTER — Other Ambulatory Visit (HOSPITAL_BASED_OUTPATIENT_CLINIC_OR_DEPARTMENT_OTHER): Payer: Self-pay | Admitting: Internal Medicine

## 2022-11-29 ENCOUNTER — Encounter: Payer: Self-pay | Admitting: *Deleted

## 2022-11-29 ENCOUNTER — Other Ambulatory Visit (HOSPITAL_COMMUNITY): Payer: Self-pay

## 2022-11-29 NOTE — Telephone Encounter (Signed)
Patient notified

## 2022-11-29 NOTE — Telephone Encounter (Signed)
Please see message below

## 2022-11-29 NOTE — Telephone Encounter (Signed)
Pharmacy Patient Advocate Encounter  Received notification from [facility] that Prior Authorization for oxyCODONE-Acetaminophen 10-325MG  tablets has been APPROVED from 11/29/22 to 05/28/23.  PA #/Case ID/Reference #: [id number]

## 2022-11-29 NOTE — Telephone Encounter (Signed)
Pharmacy Patient Advocate Encounter  Received notification from [facility] that Prior Authorization for oxyCODONE-Acetaminophen 10-325MG  tablets has been  SUBMITTED PLAN  PA #/Case ID/Reference #:  [id number]

## 2022-12-05 ENCOUNTER — Other Ambulatory Visit: Payer: Self-pay | Admitting: Family Medicine

## 2022-12-06 ENCOUNTER — Other Ambulatory Visit (HOSPITAL_BASED_OUTPATIENT_CLINIC_OR_DEPARTMENT_OTHER): Payer: Self-pay | Admitting: Internal Medicine

## 2022-12-06 ENCOUNTER — Other Ambulatory Visit: Payer: Self-pay | Admitting: Family Medicine

## 2022-12-08 NOTE — Progress Notes (Signed)
Subjective:     Patient ID: Jesse [name], male    DOB: Nov 02, 1975, 47 y.o.   MRN: [id number]  No chief complaint on file.   HPI  Seizures - ***  Hx of CVA - ***  HTN - Pt is on ***.  Bp's running ***.  No ha/dizziness/cp/palp/edema/cough/sob.  Insomnia- ***  Anxiety - ***  *** - ***    Health Maintenance Due  Topic Date Due   COVID-19 Vaccine (1) Never done    Past Medical History:  Diagnosis Date   Anxiety    Arthritis 10 years ago   Notice him having pains in joints- knees / back   Blood transfusion without reported diagnosis 11-04-20 ?   Possible during surgery/ hospital stay   CHF (congestive heart failure) (HCC) 11-04-20   Hospital stay   Depression    Essential hypertension    GERD (gastroesophageal reflux disease) 5 years ago   Started complaining about spicy food more- says hurt chest after eating   Heart murmur 11-04-20 ?   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 WITH REPLACEMENT OF ASCENDING AORTA AND HEMIARCH USING HEMASHIELD PLATINUM GRAFT AND HEMASHIELD GOLD 14 X GRAFT, RESUSPENSION OF NATIVE VALVE, AORTA TO RIGHT CAROTID BYPASS, AORTA TO RIGHT SUBCLAVIAN BYPASS;  Surgeon: Purcell Nails, MD;  Location: [facility] OR;  Service: Open Heart Surgery;  Laterality: N/A;   CARDIAC  VALVE REPLACEMENT  11-04-20/11-05-20   Stroke sent him to hospital- had heart procedure   TEE WITHOUT CARDIOVERSION N/A 11/04/2020   Procedure: TRANSESOPHAGEAL ECHOCARDIOGRAM (TEE);  Surgeon: Purcell Nails, MD;  Location: [facility] OR;  Service: Open Heart Surgery;  Laterality: N/A;     Current Outpatient Medications:    acetaminophen (TYLENOL) 325 MG tablet, Take 1-2 tablets (325-650 mg total) by mouth every 4 (four) hours as needed for mild pain., Disp: , Rfl:    amLODipine (NORVASC) 10 MG tablet, TAKE 1 TABLET BY MOUTH EVERY DAY, Disp: 90 tablet, Rfl: 1   aspirin 81 MG chewable tablet, Chew 1 tablet (81 mg total) by mouth daily., Disp: , Rfl:    carvedilol (COREG) 6.25 MG tablet, TAKE 1 TABLET BY MOUTH 2 TIMES DAILY WITH A MEAL., Disp: 90 tablet, Rfl: 3   divalproex (DEPAKOTE) 500 MG DR tablet, Take 1 tablet (500 mg total) by mouth 2 (two) times daily., Disp: 180 tablet, Rfl: 3   gabapentin (NEURONTIN) 300 MG capsule, TAKE 1 CAPSULE BY MOUTH TWICE A DAY AND 2 CAPSULES AT BEDTIME, Disp: 120 capsule, Rfl: 2   hydrOXYzine (VISTARIL) 25 MG capsule, Take 1 capsule (25 mg total) by mouth every 8 (eight) hours as needed., Disp: 30 capsule, Rfl: 2   levETIRAcetam (KEPPRA) 1000 MG tablet, Take 1 tablet (1,000 mg total) by mouth 2 (two) times daily., Disp: 180 tablet,  Rfl: 3   LORazepam (ATIVAN) 0.5 MG tablet, Take 1 tablet (0.5 mg total) by mouth at bedtime as needed for sedation., Disp: 30 tablet, Rfl: 2   losartan (COZAAR) 50 MG tablet, TAKE 1 TABLET BY MOUTH EVERY DAY, Disp: 15 tablet, Rfl: 0   Multiple Vitamins-Minerals (MULTIVITAMIN MEN PO), Take 1 tablet by mouth daily., Disp: , Rfl:    naloxone (NARCAN) nasal spray 4 mg/0.1 mL, 1 spray once., Disp: , Rfl:    oxyCODONE-acetaminophen (PERCOCET) 10-325 MG tablet, Take 1 tablet by mouth every 4 (four) hours as needed for pain. This is a 30 day suppy., Disp: 150 tablet, Rfl: 0   pantoprazole sodium (PROTONIX) 40 mg/20 mL PACK, Dissolve 1 packet in 20 mLs  (40 mg total) and take by mouth daily. (Patient taking differently: Take 40 mg by mouth daily as needed (acid reflux).), Disp: 30 mL, Rfl: 0   QUEtiapine (SEROQUEL) 50 MG tablet, Take 1 tablet (50 mg total) by mouth at bedtime., Disp: 30 tablet, Rfl: 4   rosuvastatin (CRESTOR) 20 MG tablet, TAKE 1 TABLET BY MOUTH EVERY DAY, Disp: 90 tablet, Rfl: 1   tamsulosin (FLOMAX) 0.4 MG CAPS capsule, TAKE 2 CAPSULES (0.8 MG TOTAL) BY MOUTH DAILY AFTER SUPPER., Disp: 180 capsule, Rfl: 1  No Known Allergies ROS neg/noncontributory except as noted HPI/below      Objective:     There were no vitals taken for this visit. Wt Readings from Last 3 Encounters:  09/09/22 140 lb 6 oz (63.7 kg)  04/27/22 145 lb (65.8 kg)  04/22/22 139 lb 3.2 oz (63.1 kg)    Physical Exam   Gen: WDWN NAD HEENT: NCAT, conjunctiva not injected, sclera nonicteric NECK:  supple, no thyromegaly, no nodes, no carotid bruits CARDIAC: RRR, S1S2+, no murmur. DP 2+B LUNGS: CTAB. No wheezes ABDOMEN:  BS+, soft, NTND, No HSM, no masses EXT:  no edema MSK: no gross abnormalities.  NEURO: A&O x3.  CN II-XII intact.  PSYCH: normal mood. Good eye contact     Assessment & Plan:  There are no diagnoses linked to this encounter.  No follow-ups on file.  I,Rachel Rivera,acting as a scribe for Angelena Sole, MD.,have documented all relevant documentation on the behalf of Angelena Sole, MD,as directed by  Angelena Sole, MD while in the presence of Angelena Sole, MD.  I, Isabelle Course, have reviewed all documentation for this visit. The documentation on 12/08/22 for the exam, diagnosis, procedures, and orders are all accurate and complete.  *** Isabelle Course

## 2022-12-09 ENCOUNTER — Ambulatory Visit: Payer: Medicaid Other | Admitting: Family Medicine

## 2022-12-09 ENCOUNTER — Encounter: Payer: Self-pay | Admitting: Family Medicine

## 2022-12-09 VITALS — BP 120/80 | HR 62 | Temp 98.7°F | Resp 18 | Ht 76.0 in | Wt 135.2 lb

## 2022-12-09 DIAGNOSIS — G4709 Other insomnia: Secondary | ICD-10-CM

## 2022-12-09 DIAGNOSIS — R339 Retention of urine, unspecified: Secondary | ICD-10-CM

## 2022-12-09 DIAGNOSIS — F32A Depression, unspecified: Secondary | ICD-10-CM

## 2022-12-09 DIAGNOSIS — I10 Essential (primary) hypertension: Secondary | ICD-10-CM | POA: Diagnosis not present

## 2022-12-09 DIAGNOSIS — Z8673 Personal history of transient ischemic attack (TIA), and cerebral infarction without residual deficits: Secondary | ICD-10-CM

## 2022-12-09 DIAGNOSIS — R208 Other disturbances of skin sensation: Secondary | ICD-10-CM | POA: Diagnosis not present

## 2022-12-09 MED ORDER — TAMSULOSIN HCL 0.4 MG PO CAPS: 0.8000 mg | ORAL_CAPSULE | Freq: Every day | ORAL | 1 refills | Status: DC

## 2022-12-09 MED ORDER — LOSARTAN POTASSIUM 50 MG PO TABS: 50.0000 mg | ORAL_TABLET | Freq: Every day | ORAL | 0 refills | Status: DC

## 2022-12-09 MED ORDER — QUETIAPINE FUMARATE 50 MG PO TABS: 50.0000 mg | ORAL_TABLET | Freq: Every day | ORAL | 1 refills | Status: DC

## 2022-12-09 MED ORDER — HYDROXYZINE PAMOATE 25 MG PO CAPS: 25.0000 mg | ORAL_CAPSULE | Freq: Three times a day (TID) | ORAL | 1 refills | Status: DC | PRN

## 2022-12-09 MED ORDER — ROSUVASTATIN CALCIUM 20 MG PO TABS: 20.0000 mg | ORAL_TABLET | Freq: Every day | ORAL | 1 refills | Status: DC

## 2022-12-09 NOTE — Assessment & Plan Note (Signed)
Chronic.  Thought to be mostly due to residual from stroke.  He is taking Percocet 10/325 about every 4 hours while awake.  He does this so he can be active and not sitting in the chair.  Aware not to overuse this medicine.  Some days, does not take as much.  Continue to monitor.

## 2022-12-09 NOTE — Assessment & Plan Note (Signed)
Chronic.  Continue aspirin 81 mg, Crestor 20 mg daily.  Still has some mild dysarthria, mild gait instability.  Chronic dysesthesia.  Becoming more functional/independent

## 2022-12-09 NOTE — Assessment & Plan Note (Signed)
Chronic.  Occurred after CVA.  Urinating well now.  May not need 0.8 mg of tamsulosin nightly so we will decrease to just 1 and see how he does.

## 2022-12-09 NOTE — Assessment & Plan Note (Signed)
Chronic.  Doing well on hydroxyzine 25 mg, Seroquel 50 mg, lorazepam 0.5 mg

## 2022-12-09 NOTE — Patient Instructions (Signed)
It was very nice to see you today!  Tamsulosin try one/day.    PLEASE NOTE:  If you had any lab tests please let us know if you have not heard back within a few days. You may see your results on MyChart before we have a chance to review them but we will give you a call once they are reviewed by [place]. If we ordered any referrals today, please let us know if you have not heard from their office within the next week.   Please try these tips to maintain a healthy lifestyle:  Eat most of your calories during the day when you are active. Eliminate processed foods including packaged sweets (pies, cakes, cookies), reduce intake of potatoes, white bread, white pasta, and white rice. Look for whole grain options, oat flour or almond flour.  Each meal should contain half fruits/vegetables, one quarter protein, and one quarter carbs (no bigger than a computer mouse).  Cut down on sweet beverages. This includes juice, soda, and sweet tea. Also watch fruit intake, though this is a healthier sweet option, it still contains natural sugar! Limit to 3 servings daily.  Drink at least 1 glass of water with each meal and aim for at least 8 glasses per day  Exercise at least 150 minutes every week.

## 2022-12-09 NOTE — Assessment & Plan Note (Signed)
Chronic.  Controlled.  Continue amlodipine 10 mg daily and carvedilol 6.25 mg twice daily

## 2022-12-24 ENCOUNTER — Other Ambulatory Visit: Payer: Self-pay | Admitting: Family

## 2022-12-24 ENCOUNTER — Other Ambulatory Visit: Payer: Self-pay | Admitting: Family Medicine

## 2022-12-24 ENCOUNTER — Encounter: Payer: Self-pay | Admitting: Family Medicine

## 2022-12-24 MED ORDER — OXYCODONE-ACETAMINOPHEN 10-325 MG PO TABS: 1.0000 | ORAL_TABLET | ORAL | 0 refills | Status: DC | PRN

## 2023-01-23 ENCOUNTER — Other Ambulatory Visit: Payer: Self-pay | Admitting: Family Medicine

## 2023-01-24 ENCOUNTER — Encounter: Payer: Self-pay | Admitting: Family Medicine

## 2023-01-24 MED ORDER — OXYCODONE-ACETAMINOPHEN 10-325 MG PO TABS: 1.0000 | ORAL_TABLET | ORAL | 0 refills | Status: DC | PRN

## 2023-01-25 ENCOUNTER — Other Ambulatory Visit: Payer: Self-pay | Admitting: Family Medicine

## 2023-01-25 ENCOUNTER — Other Ambulatory Visit (HOSPITAL_COMMUNITY): Payer: Self-pay

## 2023-01-25 MED ORDER — OXYCODONE-ACETAMINOPHEN 10-325 MG PO TABS
1.0000 | ORAL_TABLET | ORAL | 0 refills | Status: DC | PRN
Start: 2023-01-25 — End: 2023-02-22
  Filled 2023-01-25: qty 150, 30d supply, fill #0

## 2023-01-25 NOTE — Progress Notes (Signed)
[facility] sent [facility]

## 2023-02-04 ENCOUNTER — Other Ambulatory Visit: Payer: Self-pay | Admitting: Adult Health

## 2023-02-04 ENCOUNTER — Other Ambulatory Visit: Payer: Self-pay | Admitting: Family Medicine

## 2023-02-04 DIAGNOSIS — R208 Other disturbances of skin sensation: Secondary | ICD-10-CM

## 2023-02-22 ENCOUNTER — Other Ambulatory Visit: Payer: Self-pay | Admitting: Family Medicine

## 2023-02-22 MED ORDER — OXYCODONE-ACETAMINOPHEN 10-325 MG PO TABS
1.0000 | ORAL_TABLET | ORAL | 0 refills | Status: DC | PRN
Start: 2023-02-22 — End: 2023-03-14
  Filled 2023-02-22: qty 150, 30d supply, fill #0

## 2023-02-22 NOTE — Telephone Encounter (Signed)
Sent.  Needs to sch his appt end of November

## 2023-02-23 ENCOUNTER — Other Ambulatory Visit (HOSPITAL_COMMUNITY): Payer: Self-pay

## 2023-02-23 NOTE — Telephone Encounter (Signed)
Attempted to reach pt but vm was full . Will try again at a later time .

## 2023-02-24 ENCOUNTER — Other Ambulatory Visit: Payer: Self-pay | Admitting: Family Medicine

## 2023-02-24 ENCOUNTER — Encounter: Payer: Self-pay | Admitting: Family Medicine

## 2023-02-24 NOTE — Telephone Encounter (Signed)
Called a second time but vm still full , letter mailed .

## 2023-03-14 ENCOUNTER — Ambulatory Visit: Payer: Medicaid Other | Admitting: Family Medicine

## 2023-03-14 ENCOUNTER — Other Ambulatory Visit (HOSPITAL_COMMUNITY): Payer: Self-pay

## 2023-03-14 VITALS — BP 117/81 | HR 74 | Temp 99.1°F | Resp 18 | Ht 76.0 in | Wt 135.5 lb

## 2023-03-14 DIAGNOSIS — F419 Anxiety disorder, unspecified: Secondary | ICD-10-CM | POA: Diagnosis not present

## 2023-03-14 DIAGNOSIS — Z79899 Other long term (current) drug therapy: Secondary | ICD-10-CM | POA: Diagnosis not present

## 2023-03-14 DIAGNOSIS — F32A Depression, unspecified: Secondary | ICD-10-CM | POA: Diagnosis not present

## 2023-03-14 DIAGNOSIS — G4709 Other insomnia: Secondary | ICD-10-CM | POA: Diagnosis not present

## 2023-03-14 DIAGNOSIS — R208 Other disturbances of skin sensation: Secondary | ICD-10-CM

## 2023-03-14 MED ORDER — LORAZEPAM 0.5 MG PO TABS
0.5000 mg | ORAL_TABLET | Freq: Every evening | ORAL | 2 refills | Status: DC | PRN
  Filled 2023-03-14: qty 30, 30d supply, fill #0
  Filled 2023-05-12: qty 30, 30d supply, fill #1
  Filled 2023-06-11: qty 30, 30d supply, fill #2

## 2023-03-14 MED ORDER — OXYCODONE-ACETAMINOPHEN 10-325 MG PO TABS
1.0000 | ORAL_TABLET | ORAL | 0 refills | Status: DC | PRN
  Filled 2023-03-14 – 2023-03-16 (×2): qty 150, 25d supply, fill #0

## 2023-03-14 NOTE — Assessment & Plan Note (Signed)
Chronic.  Also having some OCD and occasional depression.  Not taking hydroxyzine during day as concerned about sedation

## 2023-03-14 NOTE — Assessment & Plan Note (Signed)
Chronic.  Doing fair on Seroquel 50 mg, lorazepam 0.5 mg and just increased hydroxyzine to 50mg .  Will monitor

## 2023-03-14 NOTE — Assessment & Plan Note (Signed)
Chronic.  Thought to be mostly due to residual from stroke.  He is taking Percocet 10/325 about every 4 hours while awake.  He does this so he can be active and not sitting in the chair.  Aware not to overuse this medicine.  Some days, does not take as much.  Jesse [name]

## 2023-03-14 NOTE — Patient Instructions (Signed)
For the hydroxyzine, take 2 at night

## 2023-03-14 NOTE — Progress Notes (Signed)
Subjective:     Patient ID: Jesse [name], male    DOB: 1975/10/05, 47 y.o.   MRN: [id number]  Chief Complaint  Patient presents with   Follow-up    3 month follow-up on pain    HPI - Here with wife  Foot/leg pain-since CVA.  neuropathy - Complains of bilateral foot sensitivity, numbness and coldness. Reports his feet feel cold and are cold to the touch at times. Complaint with oxycodone every 4 hrs PRN for chronic leg pain. Helps him remain active.  No SI.   HTN - Pt is on amlodipine 10 mg, carvedilol 6.25 mg BID. Bp's running "okay".  No ha/cp/palp/edema/cough/sob. Dizziness has improved since visit in August. Bp at today's visit was 117/81.   Insomnia/Anxiety - Reports trouble falling asleep due to increased stress. Taking Seroquel 50 mg at nighttime and Lorazepam 0.5 mg nightly.  Received hydroxyzine 25 last ov and not really working so tried 50mg  last pm.  No SI.   Smoking - Continuing to smoke. States his recent stress has prevented him from stopping. Wants to gradually cut back. Wife claims he is smoking much less.   There are no preventive care reminders to display for this patient.  Past Medical History:  Diagnosis Date   Anxiety    Arthritis 10 years ago   Notice him having pains in joints- knees / back   Blood transfusion without reported diagnosis 11-04-20 ?   Possible during surgery/ hospital stay   CHF (congestive heart failure) (HCC) 11-04-20   Hospital stay   Depression    Essential hypertension    GERD (gastroesophageal reflux disease) 5 years ago   Started complaining about spicy food more- says hurt chest after eating   Heart murmur 11-04-20 ?   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 WITH REPLACEMENT OF ASCENDING AORTA AND HEMIARCH USING HEMASHIELD PLATINUM GRAFT AND HEMASHIELD GOLD 14 X GRAFT, RESUSPENSION OF NATIVE VALVE, AORTA TO RIGHT CAROTID BYPASS, AORTA TO RIGHT SUBCLAVIAN BYPASS;  Surgeon: Purcell Nails, MD;  Location: [facility] OR;  Service: Open Heart Surgery;  Laterality: N/A;   CARDIAC VALVE REPLACEMENT  11-04-20/11-05-20   Stroke sent him to hospital- had heart procedure   TEE WITHOUT CARDIOVERSION N/A 11/04/2020   Procedure: TRANSESOPHAGEAL ECHOCARDIOGRAM (TEE);  Surgeon: Purcell Nails, MD;  Location: [facility];  Service: Open Heart Surgery;  Laterality: N/A;     Current Outpatient Medications:    acetaminophen (TYLENOL) 325 MG tablet, Take 1-2 tablets (325-650 mg total) by mouth every 4 (four) hours as needed for mild pain., Disp: , Rfl:    amLODipine (NORVASC) 10 MG tablet, TAKE 1 TABLET BY MOUTH EVERY DAY, Disp: 90 tablet, Rfl: 1   aspirin 81 MG chewable tablet, Chew 1 tablet (81 mg total) by mouth daily., Disp: ,  Rfl:    carvedilol (COREG) 6.25 MG tablet, TAKE 1 TABLET BY MOUTH 2 TIMES DAILY WITH A MEAL., Disp: 90 tablet, Rfl: 3   divalproex (DEPAKOTE) 500 MG DR tablet, Take 1 tablet (500 mg total) by mouth 2 (two) times daily., Disp: 180 tablet, Rfl: 3   gabapentin (NEURONTIN) 300 MG capsule, TAKE 1 CAPSULE BY MOUTH TWICE A DAY AND 2 CAPSULES AT BEDTIME, Disp: 120 capsule, Rfl: 2   hydrOXYzine (VISTARIL) 25 MG capsule, TAKE 1 CAPSULE (25 MG TOTAL) BY MOUTH EVERY 8 (EIGHT) HOURS AS NEEDED., Disp: 90 capsule, Rfl: 0   levETIRAcetam (KEPPRA) 1000 MG  tablet, Take 1 tablet (1,000 mg total) by mouth 2 (two) times daily., Disp: 180 tablet, Rfl: 3   losartan (COZAAR) 50 MG tablet, TAKE 1 TABLET BY MOUTH EVERY DAY, Disp: 90 tablet, Rfl: 0   Multiple Vitamins-Minerals (MULTIVITAMIN MEN PO), Take 1 tablet by mouth daily., Disp: , Rfl:    naloxone (NARCAN) nasal spray 4 mg/0.1 mL, 1 spray once., Disp: , Rfl:    QUEtiapine (SEROQUEL) 50 MG tablet, Take 1 tablet (50 mg total) by mouth at bedtime., Disp: 90 tablet, Rfl: 1   rosuvastatin (CRESTOR) 20 MG tablet, Take 1 tablet (20 mg total) by mouth daily., Disp: 90 tablet, Rfl: 1   tamsulosin (FLOMAX) 0.4 MG CAPS capsule, Take 2 capsules (0.8 mg total) by mouth daily after supper., Disp: 180 capsule, Rfl: 1   LORazepam (ATIVAN) 0.5 MG tablet, Take 1 tablet (0.5 mg total) by mouth at bedtime as needed for sedation., Disp: 30 tablet, Rfl: 2   nystatin (MYCOSTATIN) 100000 UNIT/ML suspension, Take by mouth. (Patient not taking: Reported on 03/14/2023), Disp: , Rfl:    oxyCODONE-acetaminophen (PERCOCET) 10-325 MG tablet, Take 1 tablet by mouth every 4 (four) hours as needed for pain. This is a 30 day suppy., Disp: 150 tablet, Rfl: 0  No Known Allergies ROS neg/noncontributory except as noted HPI/below    Objective:     BP 117/81   Pulse 74   Temp 99.1 F (37.3 C) (Temporal)   Resp 18   Ht 6\' 4"  (1.93 m)   Wt 135 lb 8 oz (61.5 kg)   SpO2 100%   BMI 16.49 kg/m  Wt Readings from Last 3 Encounters:  03/14/23 135 lb 8 oz (61.5 kg)  12/09/22 135 lb 4 oz (61.3 kg)  09/09/22 140 lb 6 oz (63.7 kg)    Physical Exam   Gen: WDWN NAD HEENT: NCAT, conjunctiva not injected, sclera nonicteric NECK:  supple, no thyromegaly, no nodes, no carotid bruits CARDIAC: RRR, S1S2+, no murmur. DP 2+B.  Vertical scar sternum LUNGS: CTAB. No wheezes ABDOMEN:  BS+, soft, NTND, No HSM, no masses EXT:  no edema MSK: no gross abnormalities.  NEURO: A&O x3.  CN II-XII intact.  PSYCH: normal mood. Good eye contact      Assessment & Plan:  High risk medication use -     DRUG MONITORING, PANEL 8 WITH CONFIRMATION, URINE  Dysesthesia of multiple sites Assessment & Plan: Chronic.  Thought to be mostly due to residual from stroke.  He is taking Percocet 10/325 about every 4 hours while awake.  He does this so he can be active and not sitting in the chair.  Aware not to overuse this medicine.  Some days, does not take as much.  .  Orders: -     oxyCODONE-Acetaminophen; Take 1 tablet by mouth every 4 (four) hours as needed for pain. This is a  30 day suppy.  Dispense: 150 tablet; Refill: 0  Anxiety and depression Assessment & Plan: Chronic.  Also having some OCD and occasional depression.  Not taking hydroxyzine during day as concerned about sedation  Orders: -     LORazepam; Take 1 tablet (0.5 mg total) by mouth at bedtime as needed for sedation.  Dispense: 30 tablet; Refill: 2  Other insomnia Assessment & Plan: Chronic.  Doing fair on Seroquel 50 mg, lorazepam 0.5 mg and just increased hydroxyzine to 50mg .  Will monitor  Orders: -     LORazepam; Take 1 tablet (0.5 mg total) by mouth at bedtime as needed for sedation.  Dispense: 30 tablet; Refill: 2  Sz disorder and thoracic aneurysm-advised to sch f/u w/neuro and CVTS  Return in about 3 months (around 06/14/2023) for chronic follow-up.  Germaine Pomfret Rice,acting as a scribe for Angelena Sole, MD.,have documented all relevant documentation on the behalf of Angelena Sole, MD,as directed by  Angelena Sole, MD while in the presence of Angelena Sole, MD.  I, Angelena Sole, MD, have reviewed all documentation for this visit. The documentation on 03/14/23 for the exam, diagnosis, procedures, and orders are all accurate and complete.   Angelena Sole, MD

## 2023-03-16 ENCOUNTER — Other Ambulatory Visit (HOSPITAL_COMMUNITY): Payer: Self-pay

## 2023-03-16 ENCOUNTER — Encounter: Payer: Self-pay | Admitting: Family Medicine

## 2023-03-16 LAB — DRUG MONITORING, PANEL 8 WITH CONFIRMATION, URINE
6 Acetylmorphine: NEGATIVE ng/mL (ref <10)
Alcohol Metabolites: NEGATIVE ng/mL (ref <500)
Alphahydroxyalprazolam: NEGATIVE ng/mL (ref <25)
Alphahydroxymidazolam: NEGATIVE ng/mL (ref <50)
Alphahydroxytriazolam: NEGATIVE ng/mL (ref <50)
Aminoclonazepam: NEGATIVE ng/mL (ref <25)
Amphetamines: NEGATIVE ng/mL (ref <500)
Benzodiazepines: POSITIVE ng/mL — AB (ref <100)
Buprenorphine, Urine: NEGATIVE ng/mL (ref <5)
Cocaine Metabolite: NEGATIVE ng/mL (ref <150)
Codeine: NEGATIVE ng/mL (ref <50)
Creatinine: 27.7 mg/dL (ref >=20.0)
Hydrocodone: NEGATIVE ng/mL (ref <50)
Hydromorphone: NEGATIVE ng/mL (ref <50)
Hydroxyethylflurazepam: NEGATIVE ng/mL (ref <50)
Lorazepam: 193 ng/mL — ABNORMAL HIGH (ref <50)
MDMA: NEGATIVE ng/mL (ref <500)
Marijuana Metabolite: NEGATIVE ng/mL (ref <20)
Morphine: NEGATIVE ng/mL (ref <50)
Nordiazepam: NEGATIVE ng/mL (ref <50)
Norhydrocodone: NEGATIVE ng/mL (ref <50)
Noroxycodone: 1423 ng/mL — ABNORMAL HIGH (ref <50)
Opiates: NEGATIVE ng/mL (ref <100)
Oxazepam: NEGATIVE ng/mL (ref <50)
Oxidant: NEGATIVE ug/mL (ref <200)
Oxycodone: 423 ng/mL — ABNORMAL HIGH (ref <50)
Oxycodone: POSITIVE ng/mL — AB (ref <100)
Oxymorphone: 837 ng/mL — ABNORMAL HIGH (ref <50)
Temazepam: NEGATIVE ng/mL (ref <50)
pH: 6.2 (ref 4.5–9.0)

## 2023-03-16 LAB — DM TEMPLATE

## 2023-04-13 ENCOUNTER — Other Ambulatory Visit: Payer: Self-pay | Admitting: Family Medicine

## 2023-04-13 ENCOUNTER — Other Ambulatory Visit (HOSPITAL_COMMUNITY): Payer: Self-pay

## 2023-04-13 DIAGNOSIS — R208 Other disturbances of skin sensation: Secondary | ICD-10-CM

## 2023-04-13 MED ORDER — OXYCODONE-ACETAMINOPHEN 10-325 MG PO TABS
1.0000 | ORAL_TABLET | ORAL | 0 refills | Status: DC | PRN
  Filled 2023-04-13: qty 150, 30d supply, fill #0

## 2023-05-12 ENCOUNTER — Other Ambulatory Visit: Payer: Self-pay | Admitting: Family Medicine

## 2023-05-12 ENCOUNTER — Other Ambulatory Visit: Payer: Self-pay

## 2023-05-12 ENCOUNTER — Other Ambulatory Visit (HOSPITAL_COMMUNITY): Payer: Self-pay

## 2023-05-12 DIAGNOSIS — R208 Other disturbances of skin sensation: Secondary | ICD-10-CM

## 2023-05-12 MED ORDER — OXYCODONE-ACETAMINOPHEN 10-325 MG PO TABS
1.0000 | ORAL_TABLET | ORAL | 0 refills | Status: DC | PRN
  Filled 2023-05-12: qty 150, 30d supply, fill #0

## 2023-05-31 ENCOUNTER — Other Ambulatory Visit: Payer: Self-pay | Admitting: Adult Health

## 2023-06-01 NOTE — Telephone Encounter (Signed)
 Last seen on 04/22/22 per note " Continue Depakote  and Keppra "  No 7 month follow up scheduled

## 2023-06-01 NOTE — Telephone Encounter (Signed)
Pt scheduled on 07/14/23 with Shanda Bumps, NP

## 2023-06-01 NOTE — Telephone Encounter (Signed)
 Pt has scheduled a f/u appt for medication refills. Pt would like to know when his medications will be filled. Please advise.

## 2023-06-08 ENCOUNTER — Other Ambulatory Visit: Payer: Self-pay | Admitting: Family Medicine

## 2023-06-09 ENCOUNTER — Other Ambulatory Visit: Payer: Self-pay | Admitting: Family Medicine

## 2023-06-09 ENCOUNTER — Other Ambulatory Visit (HOSPITAL_COMMUNITY): Payer: Self-pay

## 2023-06-09 DIAGNOSIS — R208 Other disturbances of skin sensation: Secondary | ICD-10-CM

## 2023-06-09 MED ORDER — OXYCODONE-ACETAMINOPHEN 10-325 MG PO TABS
1.0000 | ORAL_TABLET | ORAL | 0 refills | Status: DC | PRN
  Filled 2023-06-09: qty 150, 30d supply, fill #0

## 2023-06-09 MED ORDER — HYDROXYZINE PAMOATE 25 MG PO CAPS
25.0000 mg | ORAL_CAPSULE | Freq: Three times a day (TID) | ORAL | 1 refills | Status: DC | PRN
  Filled 2023-06-09: qty 90, 30d supply, fill #0

## 2023-06-10 ENCOUNTER — Encounter (HOSPITAL_COMMUNITY): Payer: Self-pay

## 2023-06-10 ENCOUNTER — Encounter: Payer: Self-pay | Admitting: Family Medicine

## 2023-06-10 ENCOUNTER — Other Ambulatory Visit (HOSPITAL_COMMUNITY): Payer: Self-pay

## 2023-06-11 ENCOUNTER — Encounter: Payer: Self-pay | Admitting: Adult Health

## 2023-06-11 ENCOUNTER — Other Ambulatory Visit: Payer: Self-pay | Admitting: Family Medicine

## 2023-06-11 DIAGNOSIS — G4709 Other insomnia: Secondary | ICD-10-CM

## 2023-06-11 DIAGNOSIS — F419 Anxiety disorder, unspecified: Secondary | ICD-10-CM

## 2023-06-11 DIAGNOSIS — R208 Other disturbances of skin sensation: Secondary | ICD-10-CM

## 2023-06-13 ENCOUNTER — Encounter: Payer: Self-pay | Admitting: Family Medicine

## 2023-06-13 ENCOUNTER — Other Ambulatory Visit (HOSPITAL_COMMUNITY): Payer: Self-pay

## 2023-06-13 ENCOUNTER — Other Ambulatory Visit: Payer: Self-pay | Admitting: Family Medicine

## 2023-06-13 ENCOUNTER — Encounter (HOSPITAL_COMMUNITY): Payer: Self-pay

## 2023-06-13 ENCOUNTER — Other Ambulatory Visit: Payer: Self-pay

## 2023-06-13 DIAGNOSIS — G4709 Other insomnia: Secondary | ICD-10-CM

## 2023-06-13 DIAGNOSIS — F32A Depression, unspecified: Secondary | ICD-10-CM

## 2023-06-13 MED ORDER — GABAPENTIN 300 MG PO CAPS: ORAL_CAPSULE | ORAL | 0 refills | Status: DC

## 2023-06-13 NOTE — Telephone Encounter (Signed)
 Last seen on 04/22/22 Follow up scheduled on 07/24/22

## 2023-06-14 ENCOUNTER — Other Ambulatory Visit (HOSPITAL_COMMUNITY): Payer: Self-pay

## 2023-06-14 ENCOUNTER — Telehealth: Payer: Self-pay

## 2023-06-14 NOTE — Telephone Encounter (Signed)
 Apologies, which medication is a PA required for? Thanks

## 2023-06-15 ENCOUNTER — Encounter: Payer: Self-pay | Admitting: Family Medicine

## 2023-06-15 ENCOUNTER — Telehealth: Payer: Medicare Other | Admitting: Family Medicine

## 2023-06-15 VITALS — Ht 76.0 in | Wt 163.0 lb

## 2023-06-15 DIAGNOSIS — F419 Anxiety disorder, unspecified: Secondary | ICD-10-CM

## 2023-06-15 DIAGNOSIS — I10 Essential (primary) hypertension: Secondary | ICD-10-CM | POA: Diagnosis not present

## 2023-06-15 DIAGNOSIS — R208 Other disturbances of skin sensation: Secondary | ICD-10-CM

## 2023-06-15 DIAGNOSIS — Z8673 Personal history of transient ischemic attack (TIA), and cerebral infarction without residual deficits: Secondary | ICD-10-CM

## 2023-06-15 DIAGNOSIS — G4709 Other insomnia: Secondary | ICD-10-CM | POA: Diagnosis not present

## 2023-06-15 DIAGNOSIS — F32A Depression, unspecified: Secondary | ICD-10-CM

## 2023-06-15 MED ORDER — LORAZEPAM 0.5 MG PO TABS: 0.5000 mg | ORAL_TABLET | Freq: Every evening | ORAL | 2 refills | Status: DC | PRN

## 2023-06-15 MED ORDER — AMLODIPINE BESYLATE 10 MG PO TABS
10.0000 mg | ORAL_TABLET | Freq: Every day | ORAL | 1 refills | Status: DC
Start: 2023-06-15 — End: 2023-12-08

## 2023-06-15 MED ORDER — QUETIAPINE FUMARATE 50 MG PO TABS: 50.0000 mg | ORAL_TABLET | Freq: Every day | ORAL | 1 refills | Status: DC

## 2023-06-15 MED ORDER — LOSARTAN POTASSIUM 50 MG PO TABS: 50.0000 mg | ORAL_TABLET | Freq: Every day | ORAL | 1 refills | Status: DC

## 2023-06-15 MED ORDER — ROSUVASTATIN CALCIUM 20 MG PO TABS: 20.0000 mg | ORAL_TABLET | Freq: Every day | ORAL | 1 refills | Status: DC

## 2023-06-15 NOTE — Patient Instructions (Signed)
 It was very nice to see you today!  Stay warm Schedule follow up with cardio/thoracic surgeon   PLEASE NOTE:  If you had any lab tests please let us know if you have not heard back within a few days. You may see your results on MyChart before we have a chance to review them but we will give you a call once they are reviewed by [place]. If we ordered any referrals today, please let us know if you have not heard from their office within the next week.   Please try these tips to maintain a healthy lifestyle:  Eat most of your calories during the day when you are active. Eliminate processed foods including packaged sweets (pies, cakes, cookies), reduce intake of potatoes, white bread, white pasta, and white rice. Look for whole grain options, oat flour or almond flour.  Each meal should contain half fruits/vegetables, one quarter protein, and one quarter carbs (no bigger than a computer mouse).  Cut down on sweet beverages. This includes juice, soda, and sweet tea. Also watch fruit intake, though this is a healthier sweet option, it still contains natural sugar! Limit to 3 servings daily.  Drink at least 1 glass of water with each meal and aim for at least 8 glasses per day  Exercise at least 150 minutes every week.

## 2023-06-15 NOTE — Assessment & Plan Note (Signed)
Chronic.  Continue aspirin 81 mg, Crestor 20 mg daily.  Still has some mild dysarthria, mild gait instability.  Chronic dysesthesia.  Becoming more functional/independent

## 2023-06-15 NOTE — Progress Notes (Signed)
 MyChart Video Visit Virtual Visit via Video Note   This visit type was conducted w/patient consent. This format is felt to be most appropriate for this patient at this time. Physical exam was limited by quality of the video and audio technology used for the visit. CMA was able to get the patient set up on a video visit.  Patient location: Home. Patient and provider in visit Provider location: home  I discussed the limitations of evaluation and management by telemedicine and the availability of in person appointments. The patient expressed understanding and agreed to proceed.  Visit Date: 06/15/2023  Today's healthcare provider: Angelena Sole, MD     Subjective:    Patient ID: Jesse [name], male    DOB: [birth year], 48 y.o.   MRN: [id number]  Chief Complaint  Patient presents with   Follow-up    HPI-wife present as well  HTN-Pt is on amlodipine 10 mg, carvedilol 6.25 mg BID, losartan 50mg . Bp's running "okay" but cuff not acting right.  No ha/cp/palp/edema/cough/sob  H/o CVA-on rosuvastain 20mg  daily, ASA 81mg  daily.  Doing well.  Residual neuropathy/pain  Insomnia-hydroxyzine 25-50, seroquel 50mg  and lorazepam 0.5mg  working.  No SI Foot/leg pain-since CVA.  neuropathy - Complains of bilateral foot sensitivity, numbness and coldness. Reports his feet feel cold and are cold to the touch at times. Complaint with oxycodone every 4 hrs PRN for chronic leg pain. Helps him remain active.  No SI.  More active.  Worse pain since cold out but coping.   Past Medical History:  Diagnosis Date   Anxiety    Arthritis 10 years ago   Notice him having pains in joints- knees / back   Blood transfusion without reported diagnosis 11-04-20 ?   Possible during surgery/ hospital stay   CHF (congestive heart failure) (HCC) 11-04-20   Hospital stay   Depression    Essential hypertension    GERD (gastroesophageal reflux disease) 5 years ago   Started complaining about spicy food more- says hurt chest  after eating   Heart murmur 11-04-20 ?   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 WITH REPLACEMENT OF ASCENDING AORTA AND HEMIARCH USING HEMASHIELD PLATINUM GRAFT AND HEMASHIELD GOLD 14 X GRAFT, RESUSPENSION OF NATIVE VALVE, AORTA TO RIGHT CAROTID BYPASS, AORTA TO RIGHT SUBCLAVIAN BYPASS;  Surgeon: Purcell Nails, MD;  Location: [facility] OR;  Service: Open Heart Surgery;  Laterality: N/A;   CARDIAC VALVE REPLACEMENT  11-04-20/11-05-20   Stroke sent him to hospital- had heart procedure   TEE WITHOUT CARDIOVERSION N/A 11/04/2020   Procedure: TRANSESOPHAGEAL ECHOCARDIOGRAM (TEE);  Surgeon: Purcell Nails, MD;  Location: [facility] OR;  Service: Open Heart Surgery;  Laterality: N/A;    Outpatient Medications Prior to Visit  Medication Sig Dispense Refill   acetaminophen (TYLENOL) 325 MG tablet Take 1-2  tablets (325-650 mg total) by mouth every 4 (four) hours as needed for mild pain.     amLODipine (NORVASC) 10 MG tablet TAKE 1 TABLET BY MOUTH EVERY DAY 90 tablet 1   aspirin 81 MG chewable tablet Chew 1 tablet (81 mg total) by mouth daily.     carvedilol (COREG) 6.25 MG tablet TAKE 1 TABLET BY MOUTH TWICE A DAY WITH FOOD 180 tablet 1   divalproex (DEPAKOTE) 500 MG DR tablet TAKE 1 TABLET BY MOUTH TWICE A DAY 60 tablet 0    gabapentin (NEURONTIN) 300 MG capsule TAKE 1 CAPSULE BY MOUTH TWICE A DAY AND 2 CAPSULES AT BEDTIME 120 capsule 0   hydrOXYzine (VISTARIL) 25 MG capsule Take 1 capsule (25 mg total) by mouth every 8 (eight) hours as needed. 90 capsule 1   levETIRAcetam (KEPPRA) 1000 MG tablet TAKE 1 TABLET BY MOUTH TWICE A DAY 60 tablet 0   LORazepam (ATIVAN) 0.5 MG tablet TAKE 1 TABLET (0.5 MG TOTAL) BY MOUTH AT BEDTIME AS NEEDED FOR SEDATION. 30 tablet 0   losartan (COZAAR) 50 MG tablet TAKE 1 TABLET BY MOUTH EVERY DAY 90 tablet 0   Multiple Vitamins-Minerals (MULTIVITAMIN MEN PO) Take 1 tablet by mouth daily.     naloxone (NARCAN) nasal spray 4 mg/0.1 mL 1 spray once.     nystatin (MYCOSTATIN) 100000 UNIT/ML suspension Take by mouth.     oxyCODONE-acetaminophen (PERCOCET) 10-325 MG tablet Take 1 tablet by mouth every 4 (four) hours as needed for pain. This is a 30 day suppy. 150 tablet 0   QUEtiapine (SEROQUEL) 50 MG tablet Take 1 tablet (50 mg total) by mouth at bedtime. 90 tablet 1   rosuvastatin (CRESTOR) 20 MG tablet Take 1 tablet (20 mg total) by mouth daily. 90 tablet 1   tamsulosin (FLOMAX) 0.4 MG CAPS capsule TAKE 2 CAPSULES (0.8 MG TOTAL) BY MOUTH DAILY AFTER SUPPER. 180 capsule 1   No facility-administered medications prior to visit.    No Known Allergies      Objective:     Physical Exam  Vitals and nursing note reviewed.  Constitutional:      General:  is not in acute distress.    Appearance: Normal appearance.  HENT:     Head: Normocephalic.  Pulmonary:     Effort: No respiratory distress.  Skin:    General: Skin is dry.     Coloration: Skin is not pale.  Neurological:     Mental Status: Pt is alert and oriented to person, place, and time.  Psychiatric:        Mood and Affect: Mood normal.   Ht 6\' 4"  (1.93 m)   Wt 163 lb (73.9 kg)   BMI 19.84 kg/m   Wt Readings from Last 3 Encounters:  06/15/23 163 lb (73.9 kg)  03/14/23 135 lb 8 oz (61.5 kg)  12/09/22 135 lb 4 oz (61.3  kg)       Assessment & Plan:   Problem List Items Addressed This Visit     Anxiety and depression   Benign essential HTN   Chronic. Controlled.  Continue amlodipine 10mg , coreg 6.25mg  bid and losartan 50mg  daily      Dysesthesia of multiple sites - Primary   Chronic.  Thought to be mostly due to residual from stroke.  He is taking Percocet 10/325 about every 4 hours while awake.  He does this so he can be active and not sitting in the chair.  Aware not to overuse this  medicine.  Some days, does not take as much.  .      History of CVA (cerebrovascular accident)   Chronic  Continue aspirin 81 mg, Crestor 20 mg daily.  Still has some mild dysarthria, mild gait instability.  Chronic dysesthesia.  Becoming more functional/independent      Other insomnia   Chronic.  Doing fair on Seroquel 50 mg, lorazepam 0.5 mgt,hydroxyzine  50mg .  Will monitor       No orders of the defined types were placed in this encounter.   I discussed the assessment and treatment plan with the patient. The patient was provided an opportunity to ask questions and all were answered. The patient agreed with the plan and demonstrated an understanding of the instructions.   The patient was advised to call back or seek an in-person evaluation if the symptoms worsen or if the condition fails to improve as anticipated.  Return in about 3 months (around 09/12/2023) for chronic follow-up.  Angelena Sole, MD [facility] [phone] (phone) [phone] (fax)  [facility]

## 2023-06-15 NOTE — Assessment & Plan Note (Signed)
 Chronic.  Thought to be mostly due to residual from stroke.  He is taking Percocet 10/325 about every 4 hours while awake.  He does this so he can be active and not sitting in the chair.  Aware not to overuse this medicine.  Some days, does not take as much.  Marland Kitchen

## 2023-06-15 NOTE — Assessment & Plan Note (Signed)
 Chronic.  Doing fair on Seroquel 50 mg, lorazepam 0.5 mgt,hydroxyzine  50mg .  Will monitor

## 2023-06-15 NOTE — Assessment & Plan Note (Signed)
 Chronic. Controlled.  Continue amlodipine 10mg , coreg 6.25mg  bid and losartan 50mg  daily

## 2023-06-29 ENCOUNTER — Other Ambulatory Visit (HOSPITAL_COMMUNITY): Payer: Self-pay

## 2023-06-29 ENCOUNTER — Telehealth: Payer: Self-pay

## 2023-06-29 NOTE — Telephone Encounter (Signed)
 Noted.

## 2023-06-29 NOTE — Telephone Encounter (Signed)
 Pharmacy Patient Advocate Encounter   Received notification from Patient Advice Request messages that prior authorization for LORAZEPAM 0.5MG  is required/requested.   Insurance verification completed.   The patient is insured through [facility] MEDICAID .   Per test claim: Refill too soon. PA is not needed at this time. Medication was filled 06/13/23. Next eligible fill date is 07/08/23.

## 2023-07-02 ENCOUNTER — Other Ambulatory Visit: Payer: Self-pay | Admitting: Adult Health

## 2023-07-02 ENCOUNTER — Other Ambulatory Visit: Payer: Self-pay | Admitting: Family Medicine

## 2023-07-02 DIAGNOSIS — R208 Other disturbances of skin sensation: Secondary | ICD-10-CM

## 2023-07-04 ENCOUNTER — Other Ambulatory Visit (HOSPITAL_COMMUNITY): Payer: Self-pay

## 2023-07-04 ENCOUNTER — Encounter: Payer: Self-pay | Admitting: Adult Health

## 2023-07-04 ENCOUNTER — Telehealth: Payer: Self-pay

## 2023-07-04 ENCOUNTER — Other Ambulatory Visit: Payer: Self-pay

## 2023-07-04 NOTE — Telephone Encounter (Signed)
 Last seen on 04/22/22 Follow up scheduled on 07/24/23

## 2023-07-04 NOTE — Telephone Encounter (Signed)
 Pharmacy Patient Advocate Encounter  Received notification from [facility] MEDICAID that Prior Authorization for Quetiapine 50 mg tablets  has been APPROVED from 07/04/23 to 07/03/24. Unable to obtain price due to refill too soon rejection, last fill date 07/04/23 next available fill date06/01/25   PA #/Case ID/Reference #: [id number] W

## 2023-07-04 NOTE — Telephone Encounter (Signed)
 Pharmacy Patient Advocate Encounter   Received notification from CoverMyMeds that prior authorization for Quetiapine 50 mg tablets is required/requested.   Insurance verification completed.   The patient is insured through [facility] MEDICAID .   Per test claim: PA required; PA submitted to above mentioned insurance via [facility] Tracks Key/confirmation #/EOC [id number] W Status is pending

## 2023-07-05 ENCOUNTER — Other Ambulatory Visit (HOSPITAL_COMMUNITY): Payer: Self-pay

## 2023-07-05 ENCOUNTER — Other Ambulatory Visit: Payer: Self-pay | Admitting: Family Medicine

## 2023-07-05 DIAGNOSIS — R208 Other disturbances of skin sensation: Secondary | ICD-10-CM

## 2023-07-05 MED ORDER — OXYCODONE-ACETAMINOPHEN 10-325 MG PO TABS
1.0000 | ORAL_TABLET | ORAL | 0 refills | Status: DC | PRN
  Filled 2023-07-05 – 2023-07-07 (×2): qty 150, 25d supply, fill #0
  Filled 2023-07-08: qty 150, 30d supply, fill #0

## 2023-07-06 ENCOUNTER — Other Ambulatory Visit (HOSPITAL_COMMUNITY): Payer: Self-pay

## 2023-07-06 ENCOUNTER — Encounter (HOSPITAL_COMMUNITY): Payer: Self-pay

## 2023-07-07 ENCOUNTER — Encounter (HOSPITAL_COMMUNITY): Payer: Self-pay

## 2023-07-07 ENCOUNTER — Encounter: Payer: Self-pay | Admitting: Family Medicine

## 2023-07-07 ENCOUNTER — Telehealth: Payer: Self-pay | Admitting: Family Medicine

## 2023-07-07 ENCOUNTER — Other Ambulatory Visit (HOSPITAL_COMMUNITY): Payer: Self-pay

## 2023-07-07 NOTE — Telephone Encounter (Signed)
 Pt requesting provider authorize early fill.  Copied from CRM [id number]. Topic: Clinical - Prescription Issue >> Jul 07, 2023  5:02 PM Denese Killings wrote: Reason for CRM: Patient states that pharmacy needs approval to fill patient refill a day early for oxyCODONE-acetaminophen (PERCOCET) 10-325 MG tablet. Patient goes out of town today.

## 2023-07-08 ENCOUNTER — Other Ambulatory Visit: Payer: Self-pay | Admitting: Family Medicine

## 2023-07-08 ENCOUNTER — Other Ambulatory Visit (HOSPITAL_COMMUNITY): Payer: Self-pay

## 2023-07-08 DIAGNOSIS — R208 Other disturbances of skin sensation: Secondary | ICD-10-CM

## 2023-07-08 NOTE — Telephone Encounter (Signed)
 Gina sent mychart message concerning medication. See message below.   Hi - yes I have finally picked up his rx about 2 hours ago. Before I went to the pharmacy I called [facility] insurance and have finally found out that this new insurance requires a pre authorization each month when med is prescribed. I would have loved to have know that way before now-,but this is what I was told today. So this time I picked his meds up paying full cash price and told them starting next month I will include  a prior authorization approval along with rx prescription each time meds are refilled. So sorry this has been the issue the entire time since January because of insurance company changed. Pharmacy told me that they will also include prior authorization request each time also. Hope this information is helpful because it sure can be confusing. Let me know if you need me to help otherwise. Thanks again Randall Hiss

## 2023-07-08 NOTE — Telephone Encounter (Signed)
 Left message to return my call.

## 2023-07-13 NOTE — Telephone Encounter (Signed)
 ERROR

## 2023-07-14 ENCOUNTER — Ambulatory Visit (INDEPENDENT_AMBULATORY_CARE_PROVIDER_SITE_OTHER): Payer: Medicare Other | Admitting: Adult Health

## 2023-07-14 ENCOUNTER — Encounter: Payer: Self-pay | Admitting: Adult Health

## 2023-07-14 VITALS — BP 149/82 | HR 57 | Ht 77.0 in | Wt 143.0 lb

## 2023-07-14 DIAGNOSIS — R569 Unspecified convulsions: Secondary | ICD-10-CM

## 2023-07-14 DIAGNOSIS — I69398 Other sequelae of cerebral infarction: Secondary | ICD-10-CM

## 2023-07-14 DIAGNOSIS — I69320 Aphasia following cerebral infarction: Secondary | ICD-10-CM

## 2023-07-14 DIAGNOSIS — Z5181 Encounter for therapeutic drug level monitoring: Secondary | ICD-10-CM

## 2023-07-14 DIAGNOSIS — R208 Other disturbances of skin sensation: Secondary | ICD-10-CM

## 2023-07-14 DIAGNOSIS — I63131 Cerebral infarction due to embolism of right carotid artery: Secondary | ICD-10-CM | POA: Diagnosis not present

## 2023-07-14 DIAGNOSIS — E785 Hyperlipidemia, unspecified: Secondary | ICD-10-CM | POA: Diagnosis not present

## 2023-07-14 DIAGNOSIS — H547 Unspecified visual loss: Secondary | ICD-10-CM

## 2023-07-14 MED ORDER — GABAPENTIN 300 MG PO CAPS
600.0000 mg | ORAL_CAPSULE | Freq: Two times a day (BID) | ORAL | 3 refills | Status: AC
Start: 2023-07-14 — End: ?

## 2023-07-14 MED ORDER — LEVETIRACETAM 1000 MG PO TABS: 1000.0000 mg | ORAL_TABLET | Freq: Two times a day (BID) | ORAL | 3 refills | Status: AC

## 2023-07-14 MED ORDER — DIVALPROEX SODIUM 500 MG PO DR TAB: 500.0000 mg | DELAYED_RELEASE_TABLET | Freq: Two times a day (BID) | ORAL | 3 refills | Status: AC

## 2023-07-14 NOTE — Patient Instructions (Addendum)
 Continue Depakote and Keppra at current dosages for seizure prevention  Continue gabapentin 300mg  twice daily   We will check Depakote level today as well other basic lab work   Continue aspirin 81 mg daily  and Crestor for secondary stroke prevention  Continue to follow up with PCP regarding blood pressure and cholesterol management  Maintain strict control of hypertension with blood pressure goal below 130/90, diabetes with hemoglobin A1c goal below 7.0 % and cholesterol with LDL cholesterol (bad cholesterol) goal below 70 mg/dL.   Signs of a Stroke? Follow the BEFAST method:  Balance Watch for a sudden loss of balance, trouble with coordination or vertigo Eyes Is there a sudden loss of vision in one or both eyes? Or double vision?  Face: Ask the person to smile. Does one side of the face droop or is it numb?  Arms: Ask the person to raise both arms. Does one arm drift downward? Is there weakness or numbness of a leg? Speech: Ask the person to repeat a simple phrase. Does the speech sound slurred/strange? Is the person confused ? Time: If you observe any of these signs, call 911.     Followup in the future with me in 1 year or call earlier if needed       Thank you for coming to see [place] at [facility]. I hope we have been able to provide you high quality care today.  You may receive a patient satisfaction survey over the next few weeks. We would appreciate your feedback and comments so that we may continue to improve ourselves and the health of our patients.

## 2023-07-14 NOTE — Progress Notes (Signed)
 [facility] [address] [place]. [place] [address] [phone]       STROKE FOLLOW UP NOTE  [name] Date of Birth:  1975/05/15 Medical Record Number:  [id number]   Reason for Referral: stroke follow up    SUBJECTIVE:   CHIEF COMPLAINT:  Chief Complaint  Patient presents with   Follow-up    Pt in room 3. Wife in room. Here for stroke follow up. Pt and wife said no concerns.     HPI:  Update 07/14/2023 JM: Patient returns for follow-up visit after prior visit over 1 year ago accompanied by his wife.  Overall has been stable since prior visit. No additional seizure activity.  Reports compliance on Depakote and Keppra without side effects.  No new stroke/TIA symptoms.  Continued aphasia, visual impairment and dysesthesias which have been overall stable.  Currently on gabapentin 600 mg afternoon and 600 mg at bedtime which he feels has been adequately controlling his dysesthesias. Reports compliance on aspirin and Crestor.  Routinely follows with PCP for stroke risk factor management.  Continued tobacco use about 2 cigarettes per day, working on trying to completely quit.  No questions or concerns at this time.    History provided for reference purposes only Update 04/22/2022 JM: returns for 6 month f/u accompanied by his wife  ED admission 8/4 due to change in mental status and global aphasia with episode of unresponsiveness with abnormal eye movement and posturing of left arm.  Also noted left gaze preference and right-sided weakness.  CT head and MRI negative however episode of witnessed seizure in MRI aborted with Ativan.  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  Denies any additional seizure activity, remains on Depakote and Keppra, tolerating without side effects.   Denies any new stroke/TIA  symptoms.  Residual aphasia with some improvement since prior visit especially with comprehension and reaction time. Continued visual impairment, R>L. Was seen by Dr. Dione Booze over the summer who felt vision impairment from prior stroke. Denies any change in vision since prior visit. Continues to have dysesthesias in right hand and bilateral L>R feet. Has been on gabapentin 300mg  afternoon and 600mg  at bedtime. Had difficulty tolerating morning dosage due to fatigue.  PCP recently referred to pain clinic. Unfortunately, patient is in between insurances currently and is not currently insurance, wife actively working on securing coverage but will need to wait until this occurs to proceed with pain management evaluation.   Compliant on aspirin and Crestor Blood pressure elevated today but typically well controlled Closely followed by PCP and cardiology  Update 10/19/2021 JM: Patient returns for stroke follow-up after prior visit 5 months ago.  Accompanied by his wife.  Overall stable without new stroke/TIA symptoms.  Completed SLP back in April and PT back in March.  Reports residual aphasia with some improvement since prior visit.  Plans on restarting SLP around October due to limited visits. Visual impairment persistent since prior visit, has appt with Dr. Dione Booze 6/28.  Paresthesias persist now in both legs up to knees and right hand.  On gabapentin 300mg  TID - no noticeable benefit . Dr. Wynn Banker recommended pursuing EMG/NCV for right hand symptoms but patient did not pursue as he was more concerned regarding feet pain. He has not yet has f/u visit. Due to this pain, his overall ambulation and gait has been impacted. Mood has  not been great, has not slept well and decreased appetite especially over the past 2 weeks. He has not yet been seen by [facility].   Compliant on aspirin and Crestor, denies side effects.  Blood pressure today 112/82.  Continues to follow closely with cardiothoracic, cardiology and PCP.   No  further concerns at this time  Update 05/20/2021 JM: Returns for 2-month stroke follow-up accompanied by his wife. Overall doing well without new stroke/TIA symptoms. Residual aphasia improving, recently restarted SLP.  Residual gait impairment with imbalance gradually improving, recently restarted PT.  Denies any recent falls. Visual impairment persistent since prior visit - reports limited vision upper and lower quadrants bilaterally.  Occasional double vision.  Paresthesias continued dysesthesias left leg and right hand - can be worse after prolonged ambulation or standing - occasionally painful - placed on gabapentin 100mg  BID - only taking 1 cap at night due to fatigue on BID dosing - believes some improvement. Compliant on aspirin and Crestor without side effects.  Blood pressure today 112/83.  Routinely monitors at home and typically stable.  Routinely follows with PCP and cardiology. Eval by genetics counselor 01/2021 currently awaiting genetic testing to assess for connective tissue disorder.  Reports complete tobacco cessation since prior visit.  No further concerns at this time  Initial visit 02/03/2021 JM: Patient is being seen for hospital follow-up accompanied by his wife who provides majority of history  Reports residual:  Expressive aphasia -has been gradually improving.  Currently working with SLP. Per wife, able to participate in more conversations Denies residual receptive aphasia  Gait impairment with imbalance -gradually improving.  Currently working with PT Pins/needles right hand and left foot - painful and sensitive. Wife questioning if this is interfering with imbalance Vision impairment - continued difficulty seeing the ground or reading. Will have to tilt his head to help compensate.   Worsening depression/anxiety: started on mirtazapine by PCP but not yet started (due to concern of interaction with Seroquel).  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  Wife is in the process of applying for Social Security disability.  He has since been approved for Medicaid.  Compliant on aspirin and Crestor without side effects.  Blood pressure today 106/64.  Routinely monitors at home per wife, has been on the lower side especially after medications.  No further concerns at this time   Stroke admission 11/04/2020 Mr. Jacon Whetzel is a 48 y.o. male with history of anxiety and longstanding tobacco and alcohol use, not seeing doctor for 22 years admitted on 11/04/2020 for chest pain, confusion, headache, blurry vision and aphasia.  Initial CT head unremarkable.  CTA found to have aortic dissection requiring emergent repair by Dr. Cornelius Moras.  Complicated hospital course with CT head showed relatively large infarct with cytotoxic edema in posterior right MCA and MCA/PCA watershed territories with development of petechial hemorrhage. MRI large right MCA infarct with mild cytotoxic edema and rightward midline shift with tiny punctate bilateral cerebral and cerebellar embolic infarcts.  MRA head/neck interval revascularization of right ICA and MCA and right aorto to subclavian bypass with patent right carotid artery.  EF 55 to 60%.  On 7/15, patient increasing O2 requirements and reintubated with postop course additionally complicated by acute blood loss anemia, coagulopathy, left antecubital superficial venous thrombosis (started on Keflex, Vanco  and heparin drip) and persistent hyperactive delirium requiring Precedex and eventually transition to Klonopin, Seroquel, valproic acid and Ativan as needed.  EEG 7/16 focal right hemispheric slowing and mild generalized slowing but no definite epileptiform activity.  LDL 49.  A1c 5.2.  Started on aspirin 81 mg daily per CTS. On 7/20, rapid response called for  anisicoria.  Repeat CT head 7/20 interval development of petechial hemorrhage associated with evolving posterior right MCA and MCA/PCA watershed territory infarct with cytotoxic edema and mass-effect mildly progressed with approximately 4 mm of leftward midline shift at the foramen of Monro (previously 3 mm when remeasured).   Eventually transferred to CIR on 7/21 as mobility declining any more scheduled rehab routinely improved delirium.  On 7/23, patient found to be apneic with pulse and blood pressure 72/54 and unresponsive and code initiated requiring reintubation likely due to aspiration pneumonia as well as COVID-positive state.  Extubated 7/23 and repeat CT head showed evolving right MCA territory infarct with decreasing edema.  Poor p.o. intake therefore core track placed.  Continue to have issues with poor attention, global aphasia with cognitive deficits and unsteady gait and was transferred back to CIR on 8/4.  During CIR admission, mood stable and weaned off Seroquel, renal status improved, urinary retention requiring Foley placement, improvement of p.o. intake with tube feeds discontinued 8/6 on dysphagia 2 diet and completed course of antibiotics for COVID-pneumonia.  Is eventually discharged home on 12/11/2020 with home health therapies.          PERTINENT IMAGING  CT HEAD 11/04/2020 IMPRESSION: No acute intracranial hemorrhage. Question subtle small loss of gray-white differentiation in the right insular region (ASPECT score is 9).  11/06/2020 IMPRESSION: 1. Relatively large infarct with cytotoxic edema in the posterior Right MCA and MCA/PCA watershed territories. Mild mass effect including trace leftward midline shift. No hemorrhagic transformation. 2. Preserved gray-white matter differentiation elsewhere.  11/12/2020 IMPRESSION: In comparison to CT head from 11/06/2020, interval development of petechial hemorrhage associated with the evolving posterior right MCA and  MCA/PCA watershed territory infarct. No mass occupying hemorrhagic transformation. Cytotoxic edema and mass effect is mildly progressed with approximately 4 mm of leftward midline shift at the foramen of Monro (previously 3 mm when remeasured).  11/14/2020 IMPRESSION: Acute infarct in the posterior right MCA territory. There is progressive gyral intermediate density throughout the infarct with significant change from 2 days ago. It is unclear if this is evolution of infarct with laminar necrosis versus petechial hemorrhage related to hemorrhagic transformation. Differentiation of these 2 findings could be best performed with MRI.  11/18/2020 IMPRESSION: Evolving posterior right MCA territory infarction with decreased edema and mild mass effect. No new hemorrhage.   New acute or subacute small bilateral cerebellar infarcts. Some may have been subtly present on the prior CT.   CT ANGIO  IMPRESSION: Partially imaged type A aortic dissection involving the ascending aorta and transverse portion but not the descending aorta.   Dissection involves innominate artery with thrombosis of false lumen. This extends into the right common carotid with narrowing of the true lumen to a minimal diameter of 1.5 mm. Minimal enhancement within the right cervical and intracranial ICA.   Reconstitution at the right ICA terminus. No proximal intracranial vessel occlusion. 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   MR BRAIN MRA HEAD/NECK IMPRESSION: MRI HEAD IMPRESSION: 1. Moderately large evolving acute ischemic nonhemorrhagic right MCA and MCA/PCA watershed infarct, relatively stable in distribution  as compared to previous CT. Associated regional mass effect with 5 mm of right-to-left shift, slightly increased from prior. 2. Additional scattered subcentimeter acute ischemic infarcts involving the bilateral cerebral hemispheres  and right cerebellum, embolic in nature. No associated hemorrhage or mass effect. 3. No other acute intracranial abnormality.   MRA HEAD IMPRESSION: 1. Interval re-establishment of flow within the right ICA, now patent to the terminus. Right MCA well perfused, with no visible persistent downstream occlusion evident by MRA. 2. Otherwise stable and unremarkable intracranial MRA.   MRA NECK IMPRESSION: 1. 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 2. Right carotid artery system supplied via the right subclavian bypass, and is now widely patent without residual dissection or other abnormality. 3. Left carotid artery system supplied via the native aortic arch, and remains widely patent without stenosis or other abnormality. 4. 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 Follow-up examination with dedicated CTA could be performed for further evaluation of this finding as warranted. 5. Both vertebral arteries remain widely patent within the neck. Right vertebral artery dominant.      ROS:   14 system review of systems performed and negative with exception of those listed in HPI  PMH:  Past Medical History:  Diagnosis Date   Anxiety    Arthritis 10 years ago   Notice him having pains in joints- knees / back   Blood transfusion without reported diagnosis 11-04-20 ?   Possible during surgery/ hospital stay   CHF (congestive heart failure) (HCC) 11-04-20   Hospital stay   Depression    Essential hypertension    GERD (gastroesophageal reflux disease) 5 years ago   Started complaining about spicy food more- says hurt chest after eating   Heart murmur 11-04-20 ?   Possible during hospital stay   Myocardial infarction [facility])    Neuromuscular disorder (HCC) 11-04-20   Since Stroke 11-04-2020   S/P aortic dissection repair  11/05/2020   Straight graft replacement of ascending aorta and proximal transverse aortic arch with re-suspension of native aortic valve and open hemi arch distal anastomosis with aorta to right common carotid bypass and aorta to right subclavian bypass   Seizures (HCC) [id number]   Stroke (HCC)    Ulcer ?   Possible stomach ulcer- complained of stomach hurting after spicy food and or stress related    PSH:  Past Surgical History:  Procedure Laterality Date   ASCENDING AORTIC ROOT REPLACEMENT N/A 11/04/2020   Procedure: 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 WITH REPLACEMENT OF ASCENDING AORTA AND HEMIARCH USING HEMASHIELD PLATINUM GRAFT AND HEMASHIELD GOLD 14 X GRAFT, RESUSPENSION OF NATIVE VALVE, AORTA TO RIGHT CAROTID BYPASS, AORTA TO RIGHT SUBCLAVIAN BYPASS;  Surgeon: Purcell Nails, MD;  Location: [facility] OR;  Service: Open Heart Surgery;  Laterality: N/A;   CARDIAC VALVE REPLACEMENT  11-04-20/11-05-20   Stroke sent him to hospital- had heart procedure   TEE WITHOUT CARDIOVERSION N/A 11/04/2020   Procedure: TRANSESOPHAGEAL ECHOCARDIOGRAM (TEE);  Surgeon: Purcell Nails, MD;  Location: [facility] OR;  Service: Open Heart Surgery;  Laterality: N/A;    Social History:  Social History   Socioeconomic History   Marital status: Married    Spouse name: Not on file   Number of children: 3   Years of education: Not on file   Highest education level: GED or equivalent  Occupational History  Not on file  Tobacco Use   Smoking status: Some Days    Current packs/day: 0.00    Average packs/day: 0.5 packs/day for 15.0 years (7.5 ttl pk-yrs)    Types: Cigarettes    Start date: 11/04/2005    Last attempt to quit: 11/04/2020    Years since quitting: 2.6    Passive exposure: Yes   Smokeless tobacco: Never   Tobacco comments:    Has quit since hospital stay but very aggitated  Vaping Use   Vaping status: Never Used  Substance and Sexual Activity   Alcohol use: Not Currently     Comment: none   Drug use: Not Currently    Types: Oxycodone, Other-see comments    Comment: For pain since hospital   Sexual activity: Not Currently    Birth control/protection: None  Other Topics Concern   Not on file  Social History Narrative   disabled   Social Drivers of Health   Financial Resource Strain: Medium Risk (03/14/2023)   Overall Financial Resource Strain (CARDIA)    Difficulty of Paying Living Expenses: Somewhat hard  Food Insecurity: Food Insecurity Present (03/14/2023)   Hunger Vital Sign    Worried About Running Out of Food in the Last Year: Sometimes true    Ran Out of Food in the Last Year: Often true  Transportation Needs: No Transportation Needs (03/14/2023)   PRAPARE - Administrator, Civil Service (Medical): No    Lack of Transportation (Non-Medical): No  Physical Activity: Sufficiently Active (03/14/2023)   Exercise Vital Sign    Days of Exercise per Week: 3 days    Minutes of Exercise per Session: 60 min  Stress: Stress Concern Present (03/14/2023)   [employer] of Occupational Health - Occupational Stress Questionnaire    Feeling of Stress : Rather much  Social Connections: Moderately Integrated (03/14/2023)   Social Connection and Isolation Panel [NHANES]    Frequency of Communication with Friends and Family: Twice a week    Frequency of Social Gatherings with Friends and Family: Once a week    Attends Religious Services: 1 to 4 times per year    Active Member of [employer] or Organizations: No    Attends Engineer, structural: Not on file    Marital Status: Married  Catering manager Violence: Not on file    Family History:  Family History  Problem Relation Age of Onset   Hypertension Mother    Depression Mother    COPD Mother    Arthritis Mother    Cancer Father    Heart disease Father    Hyperlipidemia Father    Hypertension Father    Heart attack Father    Depression Sister    Hyperlipidemia Brother     Diabetes Brother    Hyperlipidemia Brother    Diabetes Brother    ADD / ADHD Son     Medications:   Current Outpatient Medications on File Prior to Visit  Medication Sig Dispense Refill   acetaminophen (TYLENOL) 325 MG tablet Take 1-2 tablets (325-650 mg total) by mouth every 4 (four) hours as needed for mild pain.     amLODipine (NORVASC) 10 MG tablet Take 1 tablet (10 mg total) by mouth daily. 90 tablet 1   aspirin 81 MG chewable tablet Chew 1 tablet (81 mg total) by mouth daily.     carvedilol (COREG) 6.25 MG tablet TAKE 1 TABLET BY MOUTH TWICE A DAY WITH FOOD 180 tablet 1   divalproex (  DEPAKOTE) 500 MG DR tablet TAKE 1 TABLET BY MOUTH TWICE A DAY 60 tablet 0   gabapentin (NEURONTIN) 300 MG capsule TAKE 1 CAPSULE BY MOUTH TWICE A DAY AND 2 CAPSULES AT BEDTIME 120 capsule 0   hydrOXYzine (VISTARIL) 25 MG capsule TAKE 1 CAPSULE (25 MG TOTAL) BY MOUTH EVERY 8 (EIGHT) HOURS AS NEEDED. 90 capsule 1   levETIRAcetam (KEPPRA) 1000 MG tablet TAKE 1 TABLET BY MOUTH TWICE A DAY 60 tablet 0   LORazepam (ATIVAN) 0.5 MG tablet Take 1 tablet (0.5 mg total) by mouth at bedtime as needed for sedation. 30 tablet 2   losartan (COZAAR) 50 MG tablet Take 1 tablet (50 mg total) by mouth daily. 90 tablet 1   Multiple Vitamins-Minerals (MULTIVITAMIN MEN PO) Take 1 tablet by mouth daily.     naloxone (NARCAN) nasal spray 4 mg/0.1 mL 1 spray once.     nystatin (MYCOSTATIN) 100000 UNIT/ML suspension Take by mouth.     oxyCODONE-acetaminophen (PERCOCET) 10-325 MG tablet Take 1 tablet by mouth every 4 (four) hours as needed for pain. This is a 30 day suppy. 150 tablet 0   QUEtiapine (SEROQUEL) 50 MG tablet Take 1 tablet (50 mg total) by mouth at bedtime. 90 tablet 1   rosuvastatin (CRESTOR) 20 MG tablet Take 1 tablet (20 mg total) by mouth daily. 90 tablet 1   tamsulosin (FLOMAX) 0.4 MG CAPS capsule TAKE 2 CAPSULES (0.8 MG TOTAL) BY MOUTH DAILY AFTER SUPPER. 180 capsule 1   No current facility-administered  medications on file prior to visit.    Allergies:  No Known Allergies    OBJECTIVE:  Physical Exam  Vitals:   07/14/23 1251  BP: (!) 149/82  Pulse: (!) 57  Weight: 143 lb (64.9 kg)  Height: 6\' 5"  (1.956 m)   Body mass index is 16.96 kg/m. No results found.  General: Frail very pleasant middle-age Caucasian male, seated, in no evident distress Head: head normocephalic and atraumatic.   Neck: supple with no carotid or supraclavicular bruits Cardiovascular: regular rate and rhythm, no murmurs Musculoskeletal: no deformity Skin:  no rash/petichiae Vascular:  Normal pulses all extremities   Neurologic Exam Mental Status: Awake and fully alert. Moderate expressive aphasia. Able to speak in short sentences.  Follows commands without difficulty.  Oriented to place and time. Recent memory mildly impaired and remote memory intact. Attention span, concentration and fund of knowledge appropriate although wife supplements history. Mood and affect appropriate.  Cranial Nerves: Pupils equal, briskly reactive to light. Extraocular movements full without nystagmus. OD turns in with vertical movements and turns out with convergence. Visual fields lower visual field impairment bilaterally. Hearing intact. Facial sensation intact. Face, tongue, palate moves normally and symmetrically.  Motor: Normal bulk and tone. Normal strength in all tested extremity muscles Sensory.: intact to touch , pinprick , position and vibratory sensation Coordination: Rapid alternating movements normal in all extremities. Finger-to-nose and heel-to-shin performed accurately bilaterally. Gait and Station: Arises from chair without difficulty. Stance is normal. Gait demonstrates normal stride length and mild imbalance with slight favoring of left leg without use of assistive device.  Mild difficulty with tandem walk and heel/toe.  Reflexes: 1+ and symmetric. Toes downgoing.          ASSESSMENT: Leyland Kenna is a  48 y.o. 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 Vascular risk factors include HTN, hx of  tobacco and EtOH use, and s/p aortic dissection repair.  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  Continue gabapentin 600 mg evening and bedtime, difficulty tolerating morning dose.  Continue aspirin 81 mg daily  and Crestor 20 mg daily for secondary stroke prevention managed/prescribed by PCP Discussed secondary stroke prevention measures and importance of close PCP follow up for aggressive stroke risk factor management including HTN with BP goal<130/90 and HLD with LDL goal<70.  LDL 61 (08/2022), repeat today I have gone over the pathophysiology of stroke, warning signs and symptoms, risk factors and their management in some detail with instructions to go to the closest emergency room for symptoms of concern. Discussed importance of complete tobacco cessation, patient has been gradually trying to decrease amount with eventual goal of quitting, he is aware of importance of quitting.  Dysesthesias: right hand and R>L feet. 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  Overall stable and tolerable on gabapentin.  Recheck B12 and TSH today  Seizure, late effect of stroke:  No additional seizures Continue Depakote 500 mg twice daily and Keppra 1000 mg twice daily Obtain Depakote level today as well as CBC/D and CMP     Follow up in 1 year or call earlier if needed    CC:  PCP: Jeani Sow, MD    I spent 30 minutes of face-to-face and non-face-to-face time with patient and wife.  This included previsit chart review, lab review, study review, order entry,  electronic health record documentation, patient education and discussion regarding above diagnoses and treatment plan and answered all other questions to patient's satisfaction   Ihor Austin, [facility]  [facility] [address] [place], [place] [address]  Phone [phone] Fax [phone] Note: This document was prepared with digital dictation and possible smart phrase technology. Any transcriptional errors that result from this process are unintentional.

## 2023-07-15 LAB — CBC WITH DIFFERENTIAL/PLATELET
Basophils Absolute: 0.1 10*3/uL (ref 0.0–0.2)
Basos: 1 %
EOS (ABSOLUTE): 0.2 10*3/uL (ref 0.0–0.4)
Eos: 3 %
Hematocrit: 45.8 % (ref 37.5–51.0)
Hemoglobin: 15.7 g/dL (ref 13.0–17.7)
Immature Grans (Abs): 0 10*3/uL (ref 0.0–0.1)
Immature Granulocytes: 0 %
Lymphocytes Absolute: 2 10*3/uL (ref 0.7–3.1)
Lymphs: 35 %
MCH: 33.9 pg — ABNORMAL HIGH (ref 26.6–33.0)
MCHC: 34.3 g/dL (ref 31.5–35.7)
MCV: 99 fL — ABNORMAL HIGH (ref 79–97)
Monocytes Absolute: 0.4 10*3/uL (ref 0.1–0.9)
Monocytes: 7 %
Neutrophils Absolute: 2.9 10*3/uL (ref 1.4–7.0)
Neutrophils: 54 %
Platelets: 148 10*3/uL — ABNORMAL LOW (ref 150–450)
RBC: 4.63 x10E6/uL (ref 4.14–5.80)
RDW: 12.4 % (ref 11.6–15.4)
WBC: 5.5 10*3/uL (ref 3.4–10.8)

## 2023-07-15 LAB — COMPREHENSIVE METABOLIC PANEL
ALT: 10 IU/L (ref 0–44)
AST: 25 IU/L (ref 0–40)
Albumin: 4.7 g/dL (ref 4.1–5.1)
Alkaline Phosphatase: 54 IU/L (ref 44–121)
BUN/Creatinine Ratio: 12 (ref 9–20)
BUN: 14 mg/dL (ref 6–24)
Bilirubin Total: 0.4 mg/dL (ref 0.0–1.2)
CO2: 25 mmol/L (ref 20–29)
Calcium: 9.4 mg/dL (ref 8.7–10.2)
Chloride: 95 mmol/L — ABNORMAL LOW (ref 96–106)
Creatinine, Ser: 1.19 mg/dL (ref 0.76–1.27)
Globulin, Total: 2.1 g/dL (ref 1.5–4.5)
Glucose: 100 mg/dL — ABNORMAL HIGH (ref 70–99)
Potassium: 5.8 mmol/L — ABNORMAL HIGH (ref 3.5–5.2)
Sodium: 135 mmol/L (ref 134–144)
Total Protein: 6.8 g/dL (ref 6.0–8.5)
eGFR: 75 mL/min/{1.73_m2} (ref >59)

## 2023-07-15 LAB — VITAMIN B12: Vitamin B-12: 875 pg/mL (ref 232–1245)

## 2023-07-15 LAB — LIPID PANEL
Chol/HDL Ratio: 2.9 ratio (ref 0.0–5.0)
Cholesterol, Total: 134 mg/dL (ref 100–199)
HDL: 47 mg/dL (ref >39)
LDL Chol Calc (NIH): 67 mg/dL (ref 0–99)
Triglycerides: 109 mg/dL (ref 0–149)
VLDL Cholesterol Cal: 20 mg/dL (ref 5–40)

## 2023-07-15 LAB — VALPROIC ACID LEVEL: Valproic Acid Lvl: 74 ug/mL (ref 50–100)

## 2023-07-15 LAB — TSH: TSH: 1.62 u[IU]/mL (ref 0.450–4.500)

## 2023-07-18 ENCOUNTER — Encounter: Payer: Self-pay | Admitting: Adult Health

## 2023-07-20 ENCOUNTER — Other Ambulatory Visit: Payer: Self-pay | Admitting: *Deleted

## 2023-07-20 DIAGNOSIS — E875 Hyperkalemia: Secondary | ICD-10-CM

## 2023-07-20 NOTE — Progress Notes (Signed)
 I got labs from neuro.  Potassium is too high-could be a lab thing, but not a good thing if real.  Needs to repeat potassium soon-

## 2023-07-21 ENCOUNTER — Encounter: Payer: Self-pay | Admitting: Family Medicine

## 2023-07-25 ENCOUNTER — Encounter: Payer: Self-pay | Admitting: Family Medicine

## 2023-07-25 ENCOUNTER — Other Ambulatory Visit: Payer: Self-pay | Admitting: Family Medicine

## 2023-07-25 NOTE — Telephone Encounter (Signed)
 Pt's spouse returned call. States they are not needing this refill, she did not request it. It is set up automatically. Pt has plenty.

## 2023-07-25 NOTE — Telephone Encounter (Signed)
 Left message to return my call.

## 2023-07-27 ENCOUNTER — Other Ambulatory Visit: Payer: Self-pay | Admitting: Family

## 2023-08-04 ENCOUNTER — Other Ambulatory Visit (HOSPITAL_COMMUNITY): Payer: Self-pay

## 2023-08-04 ENCOUNTER — Other Ambulatory Visit: Payer: Self-pay | Admitting: Family Medicine

## 2023-08-04 DIAGNOSIS — R208 Other disturbances of skin sensation: Secondary | ICD-10-CM

## 2023-08-04 MED ORDER — OXYCODONE-ACETAMINOPHEN 10-325 MG PO TABS
1.0000 | ORAL_TABLET | ORAL | 0 refills | Status: DC | PRN
  Filled 2023-08-04: qty 150, 25d supply, fill #0
  Filled 2023-08-05: qty 150, 30d supply, fill #0

## 2023-08-05 ENCOUNTER — Encounter: Payer: Self-pay | Admitting: Family Medicine

## 2023-08-05 ENCOUNTER — Encounter (HOSPITAL_COMMUNITY): Payer: Self-pay

## 2023-08-05 ENCOUNTER — Other Ambulatory Visit (HOSPITAL_COMMUNITY): Payer: Self-pay

## 2023-08-08 ENCOUNTER — Other Ambulatory Visit (HOSPITAL_COMMUNITY): Payer: Self-pay

## 2023-08-09 ENCOUNTER — Other Ambulatory Visit (HOSPITAL_COMMUNITY): Payer: Self-pay

## 2023-08-09 ENCOUNTER — Telehealth: Payer: Self-pay

## 2023-08-09 NOTE — Telephone Encounter (Signed)
 Pharmacy Patient Advocate Encounter   Received notification from Patient Advice Request messages that prior authorization for Oxycodone/APAP 10/325 mg tablets is required/requested.   Insurance verification completed.   The patient is insured through [facility] MEDICAID .   Per test claim: PA required; PA submitted to above mentioned insurance via [facility] Tracks Key/confirmation #/EOC [id number] Status is pending

## 2023-08-15 ENCOUNTER — Other Ambulatory Visit (HOSPITAL_COMMUNITY): Payer: Self-pay

## 2023-08-18 ENCOUNTER — Other Ambulatory Visit (HOSPITAL_COMMUNITY): Payer: Self-pay

## 2023-08-24 ENCOUNTER — Other Ambulatory Visit (HOSPITAL_COMMUNITY): Payer: Self-pay

## 2023-09-01 ENCOUNTER — Other Ambulatory Visit: Payer: Self-pay | Admitting: Family Medicine

## 2023-09-01 ENCOUNTER — Other Ambulatory Visit: Payer: Self-pay

## 2023-09-01 DIAGNOSIS — R208 Other disturbances of skin sensation: Secondary | ICD-10-CM

## 2023-09-01 MED ORDER — OXYCODONE-ACETAMINOPHEN 10-325 MG PO TABS
1.0000 | ORAL_TABLET | ORAL | 0 refills | Status: DC | PRN
  Filled 2023-09-01: qty 150, 30d supply, fill #0

## 2023-09-02 ENCOUNTER — Other Ambulatory Visit (HOSPITAL_COMMUNITY): Payer: Self-pay

## 2023-09-14 ENCOUNTER — Other Ambulatory Visit (HOSPITAL_COMMUNITY): Payer: Self-pay

## 2023-09-29 ENCOUNTER — Other Ambulatory Visit (HOSPITAL_COMMUNITY): Payer: Self-pay

## 2023-09-29 ENCOUNTER — Other Ambulatory Visit: Payer: Self-pay | Admitting: Family Medicine

## 2023-09-29 DIAGNOSIS — R208 Other disturbances of skin sensation: Secondary | ICD-10-CM

## 2023-09-29 MED ORDER — OXYCODONE-ACETAMINOPHEN 10-325 MG PO TABS
1.0000 | ORAL_TABLET | ORAL | 0 refills | Status: DC | PRN
  Filled 2023-09-29 – 2023-09-30 (×2): qty 150, 25d supply, fill #0

## 2023-09-30 ENCOUNTER — Other Ambulatory Visit (HOSPITAL_COMMUNITY): Payer: Self-pay

## 2023-10-27 ENCOUNTER — Other Ambulatory Visit: Payer: Self-pay | Admitting: Family Medicine

## 2023-10-27 ENCOUNTER — Other Ambulatory Visit (HOSPITAL_COMMUNITY): Payer: Self-pay

## 2023-10-27 DIAGNOSIS — R208 Other disturbances of skin sensation: Secondary | ICD-10-CM

## 2023-10-27 MED ORDER — OXYCODONE-ACETAMINOPHEN 10-325 MG PO TABS
1.0000 | ORAL_TABLET | ORAL | 0 refills | Status: DC | PRN
  Filled 2023-10-27: qty 150, 25d supply, fill #0
  Filled 2023-10-31: qty 150, 30d supply, fill #0

## 2023-10-31 ENCOUNTER — Other Ambulatory Visit (HOSPITAL_COMMUNITY): Payer: Self-pay

## 2023-11-01 ENCOUNTER — Other Ambulatory Visit: Payer: Self-pay | Admitting: Family Medicine

## 2023-11-01 DIAGNOSIS — F32A Depression, unspecified: Secondary | ICD-10-CM

## 2023-11-01 DIAGNOSIS — G4709 Other insomnia: Secondary | ICD-10-CM

## 2023-11-01 NOTE — Telephone Encounter (Signed)
 Needs appt

## 2023-11-03 ENCOUNTER — Encounter: Payer: Self-pay | Admitting: Family Medicine

## 2023-11-04 ENCOUNTER — Telehealth: Payer: Self-pay | Admitting: *Deleted

## 2023-11-04 ENCOUNTER — Telehealth: Payer: Self-pay | Admitting: Family Medicine

## 2023-11-04 NOTE — Telephone Encounter (Signed)
 Left message for patient informing him of forms that have been completed and ready for pick-up.

## 2023-11-04 NOTE — Telephone Encounter (Signed)
 LVM to schedule past due follow up

## 2023-11-28 ENCOUNTER — Other Ambulatory Visit: Payer: Self-pay | Admitting: Family Medicine

## 2023-11-28 DIAGNOSIS — R208 Other disturbances of skin sensation: Secondary | ICD-10-CM

## 2023-11-28 MED ORDER — OXYCODONE-ACETAMINOPHEN 10-325 MG PO TABS
1.0000 | ORAL_TABLET | ORAL | 0 refills | Status: DC | PRN
  Filled 2023-11-28: qty 150, 30d supply, fill #0

## 2023-11-29 ENCOUNTER — Other Ambulatory Visit (HOSPITAL_COMMUNITY): Payer: Self-pay

## 2023-12-08 ENCOUNTER — Ambulatory Visit (INDEPENDENT_AMBULATORY_CARE_PROVIDER_SITE_OTHER): Admitting: Family Medicine

## 2023-12-08 ENCOUNTER — Ambulatory Visit: Payer: Self-pay | Admitting: Family Medicine

## 2023-12-08 VITALS — BP 122/70 | HR 60 | Temp 99.0°F | Resp 16 | Ht 77.0 in | Wt 133.0 lb

## 2023-12-08 DIAGNOSIS — I10 Essential (primary) hypertension: Secondary | ICD-10-CM | POA: Diagnosis not present

## 2023-12-08 DIAGNOSIS — R569 Unspecified convulsions: Secondary | ICD-10-CM

## 2023-12-08 DIAGNOSIS — G4709 Other insomnia: Secondary | ICD-10-CM | POA: Diagnosis not present

## 2023-12-08 DIAGNOSIS — F419 Anxiety disorder, unspecified: Secondary | ICD-10-CM | POA: Diagnosis not present

## 2023-12-08 DIAGNOSIS — R208 Other disturbances of skin sensation: Secondary | ICD-10-CM

## 2023-12-08 DIAGNOSIS — Z8673 Personal history of transient ischemic attack (TIA), and cerebral infarction without residual deficits: Secondary | ICD-10-CM | POA: Diagnosis not present

## 2023-12-08 DIAGNOSIS — F32A Depression, unspecified: Secondary | ICD-10-CM

## 2023-12-08 LAB — CBC WITH DIFFERENTIAL/PLATELET
Basophils Absolute: 0 K/uL (ref 0.0–0.1)
Basophils Relative: 0.4 % (ref 0.0–3.0)
Eosinophils Absolute: 0.1 K/uL (ref 0.0–0.7)
Eosinophils Relative: 1.7 % (ref 0.0–5.0)
HCT: 43.5 % (ref 39.0–52.0)
Hemoglobin: 14.6 g/dL (ref 13.0–17.0)
Lymphocytes Relative: 33.9 % (ref 12.0–46.0)
Lymphs Abs: 1.8 K/uL (ref 0.7–4.0)
MCHC: 33.6 g/dL (ref 30.0–36.0)
MCV: 99.3 fl (ref 78.0–100.0)
Monocytes Absolute: 0.4 K/uL (ref 0.1–1.0)
Monocytes Relative: 7.5 % (ref 3.0–12.0)
Neutro Abs: 3 K/uL (ref 1.4–7.7)
Neutrophils Relative %: 56.5 % (ref 43.0–77.0)
Platelets: 146 K/uL — ABNORMAL LOW (ref 150.0–400.0)
RBC: 4.38 Mil/uL (ref 4.22–5.81)
RDW: 13.5 % (ref 11.5–15.5)
WBC: 5.3 K/uL (ref 4.0–10.5)

## 2023-12-08 LAB — COMPREHENSIVE METABOLIC PANEL WITH GFR
ALT: 11 U/L (ref 0–53)
AST: 21 U/L (ref 0–37)
Albumin: 4.2 g/dL (ref 3.5–5.2)
Alkaline Phosphatase: 41 U/L (ref 39–117)
BUN: 17 mg/dL (ref 6–23)
CO2: 29 meq/L (ref 19–32)
Calcium: 9.3 mg/dL (ref 8.4–10.5)
Chloride: 96 meq/L (ref 96–112)
Creatinine, Ser: 1.21 mg/dL (ref 0.40–1.50)
GFR: 70.16 mL/min (ref >60.00)
Glucose, Bld: 103 mg/dL — ABNORMAL HIGH (ref 70–99)
Potassium: 4.4 meq/L (ref 3.5–5.1)
Sodium: 135 meq/L (ref 135–145)
Total Bilirubin: 0.5 mg/dL (ref 0.2–1.2)
Total Protein: 7 g/dL (ref 6.0–8.3)

## 2023-12-08 MED ORDER — HYDROXYZINE PAMOATE 25 MG PO CAPS: 25.0000 mg | ORAL_CAPSULE | Freq: Three times a day (TID) | ORAL | 1 refills | Status: DC | PRN

## 2023-12-08 MED ORDER — LOSARTAN POTASSIUM 50 MG PO TABS: 50.0000 mg | ORAL_TABLET | Freq: Every day | ORAL | 1 refills | Status: AC

## 2023-12-08 MED ORDER — CARVEDILOL 6.25 MG PO TABS: 6.2500 mg | ORAL_TABLET | Freq: Two times a day (BID) | ORAL | 1 refills | Status: AC

## 2023-12-08 MED ORDER — QUETIAPINE FUMARATE 100 MG PO TABS: 100.0000 mg | ORAL_TABLET | Freq: Every day | ORAL | 1 refills | Status: DC

## 2023-12-08 MED ORDER — AMLODIPINE BESYLATE 10 MG PO TABS: 10.0000 mg | ORAL_TABLET | Freq: Every day | ORAL | 1 refills | Status: AC

## 2023-12-08 MED ORDER — LORAZEPAM 0.5 MG PO TABS: 0.5000 mg | ORAL_TABLET | Freq: Every evening | ORAL | 5 refills | Status: AC | PRN

## 2023-12-08 MED ORDER — ROSUVASTATIN CALCIUM 20 MG PO TABS: 20.0000 mg | ORAL_TABLET | Freq: Every day | ORAL | 1 refills | Status: AC

## 2023-12-08 MED ORDER — TAMSULOSIN HCL 0.4 MG PO CAPS: 0.8000 mg | ORAL_CAPSULE | Freq: Every day | ORAL | 1 refills | Status: AC

## 2023-12-08 NOTE — Progress Notes (Signed)
 Subjective:     Patient ID: Jesse [name], male    DOB: [birth year], 48 y.o.   MRN: [id number]  Chief Complaint  Patient presents with   Medical Management of Chronic Issues    Follow-up     HPI Discussed the use of AI scribe software for clinical note transcription with the patient, who gave verbal consent to proceed.  History of Present Illness Jesse [name] is a 48 year old male with a history of stroke and hypertension who presents for a follow-up visit. He is accompanied by his wife.  His blood pressure readings at home are generally around 130/84 mmHg, slightly lower than today's reading, which he attributes to nervousness about the appointment. No chronic headaches or dizziness, though he experienced a couple of headaches this week due to anxiety about the visit. No chest pain or heart racing. No swelling in his legs, but persistent, chronic foot pain is noted. No significant coughing or shortness of breath.  For hypertension, he is taking amlodipine 10 mg, carvedilol 6.25 mg twice a day, and losartan 50 mg. For stroke prevention, he is on aspirin. He also takes rosuvastatin 20 mg for cholesterol management.  He is on Depakote and Keppra for seizure management following his stroke, with no recent seizures reported. He takes quetiapine 50 mg at bedtime for mood and sleep, but reports poor sleep quality. Hydroxyzine 25 mg at night is somewhat helpful for sleep.  He reports significant weight loss, having lost 10 pounds since March, attributed to poor appetite, especially in warmer weather and sickness 2 wks ago. He has started using protein shakes to aid weight gain. His wife notes a family history of rapid weight loss when unwell, with slow weight regain.  For pain management, he takes gabapentin and Percocet as needed for neuropathy and leg pain. He uses Excedrin occasionally for headaches, ensuring not to exceed safe Tylenol limits. Lorazepam is taken at night for anxiety,  usually one pill, but he took two recently due to anxiety about the appointment.  No SI and aware of interactions/contraindications of benzos/narcs  He has a history of poor sleep, attributed to lifelong habits and work patterns, having worked irregular shifts throughout his life. He denies any drug use and reports only occasional social alcohol consumption.    Health Maintenance Due  Topic Date Due   Medicare Annual Wellness (AWV)  Never done   Hepatitis B Vaccines 19-59 Average Risk (1 of 3 - 19+ 3-dose series) Never done    Past Medical History:  Diagnosis Date   Anemia 11-04-20   Hospital stay   Anxiety ?   Anxiety noticed for past 22 years- assuming always had it?   Arthritis 10 years ago   Notice him having pains in joints- knees / back   Blood transfusion without reported diagnosis 11-04-20 ?   Possible during surgery/ hospital stay   CHF (congestive heart failure) [facility]) 11-04-20   Hospital stay   Depression ? More since 11-04-20- hospital /surgery   Has had little depression throughout years- more noticable since recent health issues   Essential hypertension    GERD (gastroesophageal reflux disease) 5 years ago   Started complaining about spicy food more- says hurt chest after eating   Heart murmur 11-04-20 ?   Possible during hospital stay   Myocardial infarction [facility]) 11-04-20?   Hospital stay?   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  Started at younger age   Use to be Every day Cigarrettes / occassional alcohol- both Increase and decrease  throughout the years??   Ulcer ?   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 WITH REPLACEMENT OF ASCENDING AORTA AND HEMIARCH USING HEMASHIELD PLATINUM GRAFT AND HEMASHIELD GOLD 14 X GRAFT, RESUSPENSION OF NATIVE VALVE, AORTA TO RIGHT CAROTID BYPASS, AORTA TO RIGHT SUBCLAVIAN BYPASS;  Surgeon: Dusty Sudie DEL, MD;  Location: [facility] OR;  Service: Open Heart Surgery;  Laterality: N/A;   CARDIAC VALVE REPLACEMENT  11-04-20/11-05-20   Stroke sent him to hospital- had heart procedure   TEE WITHOUT CARDIOVERSION N/A 11/04/2020   Procedure: TRANSESOPHAGEAL ECHOCARDIOGRAM (TEE);  Surgeon: Dusty Sudie DEL, MD;  [facility];  Service: Open Heart Surgery;  Laterality: N/A;     Current Outpatient Medications:    aspirin  81 MG chewable tablet, Chew 1 tablet (81 mg total) by mouth daily., Disp: , Rfl:    divalproex  (DEPAKOTE ) 500 MG DR tablet, Take 1 tablet (500 mg total) by mouth 2 (two) times daily., Disp: 180 tablet, Rfl: 3   gabapentin  (NEURONTIN ) 300 MG capsule, Take 2 capsules (600 mg total) by mouth 2 (two) times daily. TAKE 1 CAPSULE BY MOUTH TWICE A DAY AND 2 CAPSULES AT BEDTIME, Disp: 360 capsule, Rfl: 3   levETIRAcetam  (KEPPRA ) 1000 MG tablet, Take 1 tablet (1,000 mg total) by mouth 2 (two) times daily., Disp: 180 tablet, Rfl: 3   Multiple Vitamins-Minerals (MULTIVITAMIN MEN PO), Take 1 tablet by mouth daily., Disp: , Rfl:    naloxone  (NARCAN ) nasal spray 4 mg/0.1 mL, 1 spray once., Disp: , Rfl:    nystatin (MYCOSTATIN) 100000 UNIT/ML suspension, Take by mouth., Disp: , Rfl:    oxyCODONE -acetaminophen  (PERCOCET) 10-325 MG tablet, Take 1 tablet by mouth every 4 (four) hours as needed for pain. This is a 30 day supply., Disp: 150 tablet, Rfl: 0   amLODipine  (NORVASC ) 10 MG  tablet, Take 1 tablet (10 mg total) by mouth daily., Disp: 90 tablet, Rfl: 1   carvedilol  (COREG ) 6.25 MG tablet, Take 1 tablet (6.25 mg total) by mouth 2 (two) times daily with a meal., Disp: 180 tablet, Rfl: 1   hydrOXYzine  (VISTARIL ) 25 MG capsule, Take 1 capsule (25 mg total) by mouth every 8 (eight) hours as needed., Disp: 90 capsule, Rfl: 1   LORazepam  (ATIVAN ) 0.5 MG tablet, Take 1 tablet (0.5 mg total) by mouth at bedtime as needed for sedation., Disp: 30 tablet, Rfl: 5   losartan  (COZAAR ) 50 MG tablet, Take 1 tablet (50 mg total) by mouth daily., Disp: 90 tablet, Rfl: 1   QUEtiapine  (SEROQUEL ) 100 MG tablet, Take 1 tablet (100 mg total) by mouth at bedtime., Disp: 90 tablet, Rfl: 1   rosuvastatin  (CRESTOR ) 20 MG tablet, Take 1 tablet (20 mg total) by mouth daily., Disp: 90 tablet, Rfl: 1   tamsulosin  (FLOMAX ) 0.4 MG CAPS capsule, Take 2  capsules (0.8 mg total) by mouth daily after supper., Disp: 180 capsule, Rfl: 1  No Known Allergies ROS neg/noncontributory except as noted HPI/below      Objective:     BP 122/70 (BP Location: Left Arm, Patient Position: Sitting, Cuff Size: Normal)   Pulse 60   Temp 99 F (37.2 C) (Temporal)   Resp 16   Ht 6' 5 (1.956 m)   Wt 133 lb (60.3 kg)   SpO2 99%   BMI 15.77 kg/m  Wt Readings from Last 3 Encounters:  12/08/23 133 lb (60.3 kg)  07/14/23 143 lb (64.9 kg)  06/15/23 163 lb (73.9 kg)    Physical Exam   Gen: WDWN NAD.  thin HEENT: NCAT, conjunctiva not injected, sclera nonicteric NECK:  supple, no thyromegaly, no nodes, no carotid bruits CARDIAC: RRR, S1S2+, no murmur. DP 2+B LUNGS: CTAB. No wheezes ABDOMEN:  BS+, soft, NTND, No HSM, no masses EXT:  no edema MSK: no gross abnormalities.  NEURO: A&O x3.  CN II-XII intact.  PSYCH: normal mood. Good eye contact     Assessment & Plan:  Benign essential HTN -     amLODIPine Besylate; Take 1 tablet (10 mg total) by mouth daily.  Dispense: 90 tablet; Refill: 1 -     Carvedilol;  Take 1 tablet (6.25 mg total) by mouth 2 (two) times daily with a meal.  Dispense: 180 tablet; Refill: 1 -     Losartan Potassium; Take 1 tablet (50 mg total) by mouth daily.  Dispense: 90 tablet; Refill: 1 -     CBC with Differential/Platelet -     Comprehensive metabolic panel with GFR  History of CVA (cerebrovascular accident) -     Rosuvastatin Calcium; Take 1 tablet (20 mg total) by mouth daily.  Dispense: 90 tablet; Refill: 1  Other insomnia -     QUEtiapine Fumarate; Take 1 tablet (100 mg total) by mouth at bedtime.  Dispense: 90 tablet; Refill: 1 -     LORazepam; Take 1 tablet (0.5 mg total) by mouth at bedtime as needed for sedation.  Dispense: 30 tablet; Refill: 5  Anxiety and depression -     QUEtiapine Fumarate; Take 1 tablet (100 mg total) by mouth at bedtime.  Dispense: 90 tablet; Refill: 1 -     LORazepam; Take 1 tablet (0.5 mg total) by mouth at bedtime as needed for sedation.  Dispense: 30 tablet; Refill: 5  Dysesthesia of multiple sites  Seizure (HCC)  Other orders -     Tamsulosin HCl; Take 2 capsules (0.8 mg total) by mouth daily after supper.  Dispense: 180 capsule; Refill: 1 -     hydrOXYzine Pamoate; Take 1 capsule (25 mg total) by mouth every 8 (eight) hours as needed.  Dispense: 90 capsule; Refill: 1  Assessment and Plan Assessment & Plan History of stroke   He has a history of stroke and is currently managed with aspirin. Continue aspirin therapy.  Hypertension   His hypertension is managed with amlodipine, carvedilol, and losartan. Home blood pressure readings are generally around 130/84 mmHg, though slightly elevated during the visit due to anxiety. He reports no chest pain, palpitations, or significant edema. Continue the current antihypertensive regimen.  Seizure disorder   His seizure disorder is managed with Depakote and Keppra, with no recent seizures reported. The neurologist advises continuing the current regimen as it is effective. Continue  Depakote and Keppra as prescribed.  Chronic insomnia   He experiences chronic insomnia with poor sleep quality.  Currently taking quetiapine and hydroxyzine for sleep. Increase quetiapine to 100 mg at bedtime and continue hydroxyzine.  Chronic pain with neuropathy   Chronic pain with neuropathy is managed with gabapentin and Percocet. Pain in the legs persists but is managed with the current regimen. Caution is advised regarding Tylenol intake due to potential overdose risk. Limit Excedrin use to once a week to avoid excessive Tylenol intake. PDMP checked  Anxiety disorder   His anxiety disorder is managed with lorazepam. Increased anxiety is noted around medical appointments. Limit lorazepam to one per night due to potential overdose risk when combined with Percocet.  Benign prostatic hyperplasia with lower urinary tract symptoms   Benign prostatic hyperplasia is managed with Flomax. He experiences occasional difficulty urinating, which improves with increased water intake. Continue Flomax and encourage adequate hydration.  Unintentional weight loss   He has experienced an unintentional weight loss of 10 pounds since March and is noted to be underweight. He has a poor appetite, especially in the morning, and a family history of rapid weight loss when unwell. Protein shakes have been initiated to supplement his diet. Monitor weight weekly, encourage protein shakes, especially in the morning, and encourage balanced meals.    Return in about 3 months (around 03/09/2024) for 3 mo virtual, 6 mo in person.  Jenkins CHRISTELLA Carrel, MD

## 2023-12-08 NOTE — Progress Notes (Signed)
 Looks good

## 2023-12-08 NOTE — Patient Instructions (Signed)

## 2023-12-27 ENCOUNTER — Other Ambulatory Visit (HOSPITAL_COMMUNITY): Payer: Self-pay

## 2023-12-27 ENCOUNTER — Other Ambulatory Visit: Payer: Self-pay | Admitting: Family Medicine

## 2023-12-27 DIAGNOSIS — R208 Other disturbances of skin sensation: Secondary | ICD-10-CM

## 2023-12-27 MED ORDER — OXYCODONE-ACETAMINOPHEN 10-325 MG PO TABS
1.0000 | ORAL_TABLET | ORAL | 0 refills | Status: DC | PRN
  Filled 2023-12-27: qty 150, 30d supply, fill #0

## 2024-01-02 ENCOUNTER — Other Ambulatory Visit: Payer: Self-pay | Admitting: Family Medicine

## 2024-01-02 DIAGNOSIS — F419 Anxiety disorder, unspecified: Secondary | ICD-10-CM

## 2024-01-02 DIAGNOSIS — G4709 Other insomnia: Secondary | ICD-10-CM

## 2024-01-23 ENCOUNTER — Other Ambulatory Visit: Payer: Self-pay | Admitting: Family Medicine

## 2024-01-23 DIAGNOSIS — R208 Other disturbances of skin sensation: Secondary | ICD-10-CM

## 2024-01-23 MED ORDER — OXYCODONE-ACETAMINOPHEN 10-325 MG PO TABS
1.0000 | ORAL_TABLET | ORAL | 0 refills | Status: DC | PRN
  Filled 2024-01-23: qty 150, 30d supply, fill #0

## 2024-01-23 NOTE — Telephone Encounter (Signed)
 Refill request for Percocet 10-325mg  150tab 0refill; last fill 12/27/2023; last ov 12/08/23

## 2024-01-24 ENCOUNTER — Other Ambulatory Visit (HOSPITAL_COMMUNITY): Payer: Self-pay

## 2024-02-20 ENCOUNTER — Other Ambulatory Visit (HOSPITAL_COMMUNITY): Payer: Self-pay

## 2024-02-20 ENCOUNTER — Other Ambulatory Visit: Payer: Self-pay | Admitting: Family Medicine

## 2024-02-20 ENCOUNTER — Other Ambulatory Visit: Payer: Self-pay

## 2024-02-20 DIAGNOSIS — R208 Other disturbances of skin sensation: Secondary | ICD-10-CM

## 2024-02-20 MED ORDER — OXYCODONE-ACETAMINOPHEN 10-325 MG PO TABS
1.0000 | ORAL_TABLET | ORAL | 0 refills | Status: DC | PRN
  Filled 2024-02-20: qty 150, 25d supply, fill #0
  Filled 2024-02-21: qty 150, 30d supply, fill #0

## 2024-02-20 NOTE — Telephone Encounter (Signed)
 Refill Request for Oxycodone ; Last fill 01/23/24; Last OV 12/08/23

## 2024-02-21 ENCOUNTER — Other Ambulatory Visit (HOSPITAL_COMMUNITY): Payer: Self-pay

## 2024-02-29 ENCOUNTER — Other Ambulatory Visit: Payer: Self-pay

## 2024-02-29 ENCOUNTER — Other Ambulatory Visit: Payer: Self-pay | Admitting: Family Medicine

## 2024-02-29 DIAGNOSIS — F32A Depression, unspecified: Secondary | ICD-10-CM

## 2024-02-29 MED ORDER — HYDROXYZINE PAMOATE 25 MG PO CAPS: 25.0000 mg | ORAL_CAPSULE | Freq: Three times a day (TID) | ORAL | Status: AC | PRN

## 2024-03-08 ENCOUNTER — Telehealth: Payer: Self-pay | Admitting: Family Medicine

## 2024-03-08 NOTE — Telephone Encounter (Signed)
 LVM to change appt to in-person due to Medicare no longer covering virtual appts.

## 2024-03-09 ENCOUNTER — Ambulatory Visit: Admitting: Family Medicine

## 2024-03-19 ENCOUNTER — Encounter (HOSPITAL_COMMUNITY): Payer: Self-pay

## 2024-03-19 ENCOUNTER — Other Ambulatory Visit: Payer: Self-pay | Admitting: Family Medicine

## 2024-03-19 ENCOUNTER — Other Ambulatory Visit (HOSPITAL_COMMUNITY): Payer: Self-pay

## 2024-03-19 DIAGNOSIS — R208 Other disturbances of skin sensation: Secondary | ICD-10-CM

## 2024-03-19 MED ORDER — OXYCODONE-ACETAMINOPHEN 10-325 MG PO TABS
1.0000 | ORAL_TABLET | ORAL | 0 refills | Status: DC | PRN
  Filled 2024-03-19: qty 150, 25d supply, fill #0
  Filled 2024-03-20: qty 150, 30d supply, fill #0

## 2024-03-20 ENCOUNTER — Other Ambulatory Visit (HOSPITAL_COMMUNITY): Payer: Self-pay

## 2024-04-16 ENCOUNTER — Other Ambulatory Visit (HOSPITAL_COMMUNITY): Payer: Self-pay

## 2024-04-16 ENCOUNTER — Other Ambulatory Visit: Payer: Self-pay | Admitting: Family

## 2024-04-16 DIAGNOSIS — R208 Other disturbances of skin sensation: Secondary | ICD-10-CM

## 2024-04-16 MED ORDER — OXYCODONE-ACETAMINOPHEN 10-325 MG PO TABS
1.0000 | ORAL_TABLET | ORAL | 0 refills | Status: DC | PRN
  Filled 2024-04-16 – 2024-04-17 (×2): qty 150, 30d supply, fill #0

## 2024-04-17 ENCOUNTER — Other Ambulatory Visit (HOSPITAL_COMMUNITY): Payer: Self-pay

## 2024-05-14 ENCOUNTER — Other Ambulatory Visit (HOSPITAL_COMMUNITY): Payer: Self-pay

## 2024-05-14 ENCOUNTER — Other Ambulatory Visit: Payer: Self-pay | Admitting: Family Medicine

## 2024-05-14 DIAGNOSIS — R208 Other disturbances of skin sensation: Secondary | ICD-10-CM

## 2024-05-14 MED ORDER — OXYCODONE-ACETAMINOPHEN 10-325 MG PO TABS
1.0000 | ORAL_TABLET | ORAL | 0 refills | Status: AC | PRN
  Filled 2024-05-14 (×2): qty 150, 25d supply, fill #0
  Filled 2024-05-15: qty 150, 30d supply, fill #0

## 2024-05-15 ENCOUNTER — Other Ambulatory Visit (HOSPITAL_COMMUNITY): Payer: Self-pay

## 2024-05-15 ENCOUNTER — Telehealth (HOSPITAL_COMMUNITY): Payer: Self-pay

## 2024-05-15 NOTE — Telephone Encounter (Signed)
 Pharmacy Patient Advocate Encounter  Received notification from [employer] that Prior Authorization for oxyCODONE -Acetaminophen  10-325MG  tablets  has been APPROVED from 05/15/24 to 04/25/25. Unable to obtain price due to refill too soon rejection, last fill date 05/15/24 next available fill date 06/12/24   PA #/Case ID/Reference #: [id number]

## 2024-05-15 NOTE — Telephone Encounter (Signed)
 PA request has been Received. New Encounter has been or will be created for follow up. For additional info see Pharmacy Prior Auth telephone encounter from 05/15/24.

## 2024-05-15 NOTE — Telephone Encounter (Signed)
 Pharmacy Patient Advocate Encounter   Received notification from Pt Calls Messages that prior authorization for oxyCODONE -Acetaminophen  10-325MG  tablets  is required/requested.   Insurance verification completed.   The patient is insured through [place].   Per test claim: PA required; PA submitted to above mentioned insurance via Latent Key/confirmation #/EOC [id number] Status is pending

## 2024-05-30 ENCOUNTER — Other Ambulatory Visit: Payer: Self-pay | Admitting: Family Medicine

## 2024-05-30 DIAGNOSIS — G4709 Other insomnia: Secondary | ICD-10-CM

## 2024-05-30 DIAGNOSIS — F419 Anxiety disorder, unspecified: Secondary | ICD-10-CM

## 2024-06-11 ENCOUNTER — Ambulatory Visit: Admitting: Family Medicine

## 2024-07-17 ENCOUNTER — Ambulatory Visit: Admitting: Adult Health
